# Patient Record
Sex: Female | Born: 1945
Health system: Southern US, Community
[De-identification: ages and names within clinical notes are randomized; demographics above are authoritative.]

## PROBLEM LIST (undated history)

## (undated) DIAGNOSIS — K219 Gastro-esophageal reflux disease without esophagitis: Secondary | ICD-10-CM

## (undated) DIAGNOSIS — D649 Anemia, unspecified: Secondary | ICD-10-CM

## (undated) DIAGNOSIS — I2699 Other pulmonary embolism without acute cor pulmonale: Secondary | ICD-10-CM

## (undated) DIAGNOSIS — F419 Anxiety disorder, unspecified: Secondary | ICD-10-CM

## (undated) DIAGNOSIS — I6529 Occlusion and stenosis of unspecified carotid artery: Secondary | ICD-10-CM

## (undated) DIAGNOSIS — J189 Pneumonia, unspecified organism: Secondary | ICD-10-CM

## (undated) DIAGNOSIS — M25552 Pain in left hip: Secondary | ICD-10-CM

## (undated) DIAGNOSIS — M545 Low back pain: Secondary | ICD-10-CM

## (undated) DIAGNOSIS — I10 Essential (primary) hypertension: Secondary | ICD-10-CM

## (undated) DIAGNOSIS — I509 Heart failure, unspecified: Secondary | ICD-10-CM

## (undated) DIAGNOSIS — R739 Hyperglycemia, unspecified: Secondary | ICD-10-CM

## (undated) DIAGNOSIS — K759 Inflammatory liver disease, unspecified: Secondary | ICD-10-CM

## (undated) DIAGNOSIS — Z0000 Encounter for general adult medical examination without abnormal findings: Secondary | ICD-10-CM

## (undated) DIAGNOSIS — H3530 Unspecified macular degeneration: Secondary | ICD-10-CM

## (undated) DIAGNOSIS — N184 Chronic kidney disease, stage 4 (severe): Secondary | ICD-10-CM

## (undated) DIAGNOSIS — R251 Tremor, unspecified: Secondary | ICD-10-CM

## (undated) DIAGNOSIS — E039 Hypothyroidism, unspecified: Secondary | ICD-10-CM

## (undated) DIAGNOSIS — N390 Urinary tract infection, site not specified: Secondary | ICD-10-CM

## (undated) DIAGNOSIS — M25561 Pain in right knee: Secondary | ICD-10-CM

## (undated) DIAGNOSIS — B019 Varicella without complication: Secondary | ICD-10-CM

## (undated) DIAGNOSIS — M25562 Pain in left knee: Secondary | ICD-10-CM

## (undated) DIAGNOSIS — E785 Hyperlipidemia, unspecified: Secondary | ICD-10-CM

## (undated) DIAGNOSIS — I2510 Atherosclerotic heart disease of native coronary artery without angina pectoris: Secondary | ICD-10-CM

## (undated) DIAGNOSIS — G4730 Sleep apnea, unspecified: Secondary | ICD-10-CM

## (undated) DIAGNOSIS — E079 Disorder of thyroid, unspecified: Secondary | ICD-10-CM

## (undated) DIAGNOSIS — J45909 Unspecified asthma, uncomplicated: Secondary | ICD-10-CM

## (undated) DIAGNOSIS — J449 Chronic obstructive pulmonary disease, unspecified: Secondary | ICD-10-CM

## (undated) DIAGNOSIS — A048 Other specified bacterial intestinal infections: Secondary | ICD-10-CM

## (undated) DIAGNOSIS — I639 Cerebral infarction, unspecified: Secondary | ICD-10-CM

## (undated) DIAGNOSIS — B269 Mumps without complication: Secondary | ICD-10-CM

## (undated) DIAGNOSIS — F329 Major depressive disorder, single episode, unspecified: Secondary | ICD-10-CM

## (undated) DIAGNOSIS — R0789 Other chest pain: Secondary | ICD-10-CM

## (undated) DIAGNOSIS — N189 Chronic kidney disease, unspecified: Secondary | ICD-10-CM

## (undated) DIAGNOSIS — M1990 Unspecified osteoarthritis, unspecified site: Secondary | ICD-10-CM

## (undated) HISTORY — DX: Disorder of thyroid, unspecified: E07.9

## (undated) HISTORY — PX: CATARACT EXTRACTION W/ INTRAOCULAR LENS  IMPLANT, BILATERAL: SHX1307

## (undated) HISTORY — DX: Urinary tract infection, site not specified: N39.0

## (undated) HISTORY — DX: Pain in right knee: M25.561

## (undated) HISTORY — DX: Chronic kidney disease, unspecified: N18.9

## (undated) HISTORY — DX: Varicella without complication: B01.9

## (undated) HISTORY — DX: Encounter for general adult medical examination without abnormal findings: Z00.00

## (undated) HISTORY — DX: Pain in left hip: M25.552

## (undated) HISTORY — DX: Unspecified macular degeneration: H35.30

## (undated) HISTORY — PX: MULTIPLE TOOTH EXTRACTIONS: SHX2053

## (undated) HISTORY — PX: CARDIAC CATHETERIZATION: SHX172

## (undated) HISTORY — DX: Heart failure, unspecified: I50.9

## (undated) HISTORY — DX: Occlusion and stenosis of unspecified carotid artery: I65.29

## (undated) HISTORY — DX: Anxiety disorder, unspecified: F41.9

## (undated) HISTORY — DX: Anemia, unspecified: D64.9

## (undated) HISTORY — DX: Hypothyroidism, unspecified: E03.9

## (undated) HISTORY — DX: Major depressive disorder, single episode, unspecified: F32.9

## (undated) HISTORY — DX: Gastro-esophageal reflux disease without esophagitis: K21.9

## (undated) HISTORY — DX: Unspecified osteoarthritis, unspecified site: M19.90

## (undated) HISTORY — PX: TOTAL KNEE ARTHROPLASTY: SHX125

## (undated) HISTORY — DX: Essential (primary) hypertension: I10

## (undated) HISTORY — DX: Chronic kidney disease, stage 4 (severe): N18.4

## (undated) HISTORY — DX: Other pulmonary embolism without acute cor pulmonale: I26.99

## (undated) HISTORY — PX: JOINT REPLACEMENT: SHX530

## (undated) HISTORY — DX: Hyperglycemia, unspecified: R73.9

## (undated) HISTORY — DX: Unspecified asthma, uncomplicated: J45.909

## (undated) HISTORY — DX: Mumps without complication: B26.9

## (undated) HISTORY — DX: Other specified bacterial intestinal infections: A04.8

## (undated) HISTORY — DX: Low back pain: M54.5

## (undated) HISTORY — DX: Other chest pain: R07.89

## (undated) HISTORY — DX: Tremor, unspecified: R25.1

## (undated) HISTORY — DX: Pain in left knee: M25.562

## (undated) HISTORY — PX: CATARACT EXTRACTION: SUR2

## (undated) HISTORY — DX: Hyperlipidemia, unspecified: E78.5

## (undated) HISTORY — PX: WISDOM TOOTH EXTRACTION: SHX21

## (undated) HISTORY — DX: Chronic obstructive pulmonary disease, unspecified: J44.9

---

## 1982-07-31 HISTORY — PX: APPENDECTOMY: SHX54

## 1982-07-31 HISTORY — PX: VAGINAL HYSTERECTOMY: SUR661

## 1982-07-31 LAB — HM PAP SMEAR

## 2000-12-21 ENCOUNTER — Ambulatory Visit (HOSPITAL_COMMUNITY): Admission: RE | Admit: 2000-12-21 | Discharge: 2000-12-21 | Payer: Self-pay | Admitting: Internal Medicine

## 2000-12-21 ENCOUNTER — Encounter: Payer: Self-pay | Admitting: Internal Medicine

## 2001-02-27 ENCOUNTER — Emergency Department (HOSPITAL_COMMUNITY): Admission: EM | Admit: 2001-02-27 | Discharge: 2001-02-27 | Payer: Self-pay | Admitting: Emergency Medicine

## 2002-04-04 ENCOUNTER — Encounter: Payer: Self-pay | Admitting: Family Medicine

## 2002-04-04 ENCOUNTER — Ambulatory Visit (HOSPITAL_COMMUNITY): Admission: RE | Admit: 2002-04-04 | Discharge: 2002-04-04 | Payer: Self-pay | Admitting: Family Medicine

## 2002-04-22 ENCOUNTER — Ambulatory Visit (HOSPITAL_COMMUNITY): Admission: RE | Admit: 2002-04-22 | Discharge: 2002-04-22 | Payer: Self-pay | Admitting: Pulmonary Disease

## 2002-07-21 ENCOUNTER — Emergency Department (HOSPITAL_COMMUNITY): Admission: EM | Admit: 2002-07-21 | Discharge: 2002-07-22 | Payer: Self-pay | Admitting: *Deleted

## 2002-07-22 ENCOUNTER — Ambulatory Visit (HOSPITAL_COMMUNITY): Admission: RE | Admit: 2002-07-22 | Discharge: 2002-07-22 | Payer: Self-pay | Admitting: Family Medicine

## 2002-07-22 ENCOUNTER — Encounter: Payer: Self-pay | Admitting: Family Medicine

## 2002-10-17 ENCOUNTER — Encounter: Payer: Self-pay | Admitting: Family Medicine

## 2002-10-17 ENCOUNTER — Ambulatory Visit (HOSPITAL_COMMUNITY): Admission: RE | Admit: 2002-10-17 | Discharge: 2002-10-17 | Payer: Self-pay | Admitting: Family Medicine

## 2002-12-15 ENCOUNTER — Encounter: Payer: Self-pay | Admitting: Emergency Medicine

## 2002-12-15 ENCOUNTER — Emergency Department (HOSPITAL_COMMUNITY): Admission: EM | Admit: 2002-12-15 | Discharge: 2002-12-15 | Payer: Self-pay | Admitting: Emergency Medicine

## 2003-01-06 ENCOUNTER — Encounter: Payer: Self-pay | Admitting: Family Medicine

## 2003-01-06 ENCOUNTER — Ambulatory Visit (HOSPITAL_COMMUNITY): Admission: RE | Admit: 2003-01-06 | Discharge: 2003-01-06 | Payer: Self-pay | Admitting: Family Medicine

## 2003-09-25 ENCOUNTER — Emergency Department (HOSPITAL_COMMUNITY): Admission: EM | Admit: 2003-09-25 | Discharge: 2003-09-25 | Payer: Self-pay | Admitting: Emergency Medicine

## 2003-12-24 ENCOUNTER — Inpatient Hospital Stay (HOSPITAL_COMMUNITY): Admission: RE | Admit: 2003-12-24 | Discharge: 2003-12-28 | Payer: Self-pay | Admitting: Orthopedic Surgery

## 2004-01-04 ENCOUNTER — Emergency Department (HOSPITAL_COMMUNITY): Admission: EM | Admit: 2004-01-04 | Discharge: 2004-01-04 | Payer: Self-pay | Admitting: Emergency Medicine

## 2004-04-18 ENCOUNTER — Ambulatory Visit (HOSPITAL_COMMUNITY): Admission: RE | Admit: 2004-04-18 | Discharge: 2004-04-18 | Payer: Self-pay | Admitting: Pulmonary Disease

## 2004-06-01 ENCOUNTER — Ambulatory Visit (HOSPITAL_COMMUNITY): Admission: RE | Admit: 2004-06-01 | Discharge: 2004-06-01 | Payer: Self-pay | Admitting: Internal Medicine

## 2004-07-21 ENCOUNTER — Ambulatory Visit (HOSPITAL_COMMUNITY): Admission: RE | Admit: 2004-07-21 | Discharge: 2004-07-21 | Payer: Self-pay | Admitting: Family Medicine

## 2004-08-15 ENCOUNTER — Ambulatory Visit: Payer: Self-pay | Admitting: Internal Medicine

## 2004-08-15 ENCOUNTER — Ambulatory Visit (HOSPITAL_COMMUNITY): Admission: RE | Admit: 2004-08-15 | Discharge: 2004-08-15 | Payer: Self-pay | Admitting: Internal Medicine

## 2004-08-16 ENCOUNTER — Inpatient Hospital Stay (HOSPITAL_BASED_OUTPATIENT_CLINIC_OR_DEPARTMENT_OTHER): Admission: RE | Admit: 2004-08-16 | Discharge: 2004-08-16 | Payer: Self-pay | Admitting: *Deleted

## 2004-08-16 ENCOUNTER — Ambulatory Visit: Payer: Self-pay | Admitting: *Deleted

## 2004-09-01 ENCOUNTER — Ambulatory Visit: Payer: Self-pay | Admitting: Internal Medicine

## 2005-05-25 ENCOUNTER — Ambulatory Visit (HOSPITAL_COMMUNITY): Admission: RE | Admit: 2005-05-25 | Discharge: 2005-05-25 | Payer: Self-pay | Admitting: Pulmonary Disease

## 2006-02-09 ENCOUNTER — Ambulatory Visit (HOSPITAL_COMMUNITY): Admission: RE | Admit: 2006-02-09 | Discharge: 2006-02-09 | Payer: Self-pay | Admitting: Endocrinology

## 2006-03-16 ENCOUNTER — Ambulatory Visit (HOSPITAL_COMMUNITY): Admission: RE | Admit: 2006-03-16 | Discharge: 2006-03-16 | Payer: Self-pay | Admitting: Endocrinology

## 2006-09-11 ENCOUNTER — Ambulatory Visit (HOSPITAL_COMMUNITY): Payer: Self-pay | Admitting: Family Medicine

## 2006-09-11 ENCOUNTER — Encounter (HOSPITAL_COMMUNITY): Admission: RE | Admit: 2006-09-11 | Discharge: 2006-10-11 | Payer: Self-pay | Admitting: Family Medicine

## 2006-09-12 ENCOUNTER — Encounter: Admission: RE | Admit: 2006-09-12 | Discharge: 2006-09-12 | Payer: Self-pay | Admitting: Nephrology

## 2006-10-18 ENCOUNTER — Encounter (HOSPITAL_COMMUNITY): Admission: RE | Admit: 2006-10-18 | Discharge: 2007-01-08 | Payer: Self-pay | Admitting: Nephrology

## 2006-11-06 ENCOUNTER — Encounter (HOSPITAL_COMMUNITY): Admission: RE | Admit: 2006-11-06 | Discharge: 2006-12-06 | Payer: Self-pay | Admitting: Nephrology

## 2006-11-06 ENCOUNTER — Ambulatory Visit (HOSPITAL_COMMUNITY): Payer: Self-pay | Admitting: Nephrology

## 2006-12-04 ENCOUNTER — Ambulatory Visit (HOSPITAL_COMMUNITY): Payer: Self-pay | Admitting: Nephrology

## 2007-01-01 ENCOUNTER — Encounter (HOSPITAL_COMMUNITY): Admission: RE | Admit: 2007-01-01 | Discharge: 2007-01-31 | Payer: Self-pay | Admitting: Family Medicine

## 2007-01-01 ENCOUNTER — Ambulatory Visit (HOSPITAL_COMMUNITY): Payer: Self-pay | Admitting: Family Medicine

## 2007-01-11 ENCOUNTER — Emergency Department (HOSPITAL_COMMUNITY): Admission: EM | Admit: 2007-01-11 | Discharge: 2007-01-11 | Payer: Self-pay | Admitting: Emergency Medicine

## 2007-02-25 ENCOUNTER — Emergency Department (HOSPITAL_COMMUNITY): Admission: EM | Admit: 2007-02-25 | Discharge: 2007-02-25 | Payer: Self-pay | Admitting: Emergency Medicine

## 2007-02-26 ENCOUNTER — Encounter (HOSPITAL_COMMUNITY): Admission: RE | Admit: 2007-02-26 | Discharge: 2007-03-28 | Payer: Self-pay | Admitting: Nephrology

## 2007-02-26 ENCOUNTER — Ambulatory Visit (HOSPITAL_COMMUNITY): Payer: Self-pay | Admitting: Nephrology

## 2007-03-12 ENCOUNTER — Ambulatory Visit (HOSPITAL_COMMUNITY): Payer: Self-pay | Admitting: Oncology

## 2007-03-20 ENCOUNTER — Ambulatory Visit (HOSPITAL_COMMUNITY): Admission: RE | Admit: 2007-03-20 | Discharge: 2007-03-20 | Payer: Self-pay | Admitting: Nephrology

## 2007-03-21 ENCOUNTER — Encounter (HOSPITAL_COMMUNITY): Admission: RE | Admit: 2007-03-21 | Discharge: 2007-04-20 | Payer: Self-pay | Admitting: *Deleted

## 2007-03-26 ENCOUNTER — Ambulatory Visit (HOSPITAL_COMMUNITY): Payer: Self-pay | Admitting: Family Medicine

## 2007-03-26 ENCOUNTER — Encounter (HOSPITAL_COMMUNITY): Admission: RE | Admit: 2007-03-26 | Discharge: 2007-04-25 | Payer: Self-pay | Admitting: Family Medicine

## 2007-04-09 ENCOUNTER — Encounter (HOSPITAL_COMMUNITY): Admission: RE | Admit: 2007-04-09 | Discharge: 2007-04-30 | Payer: Self-pay | Admitting: Nephrology

## 2007-04-23 ENCOUNTER — Ambulatory Visit (HOSPITAL_COMMUNITY): Payer: Self-pay | Admitting: Family Medicine

## 2007-05-06 ENCOUNTER — Inpatient Hospital Stay (HOSPITAL_COMMUNITY): Admission: AD | Admit: 2007-05-06 | Discharge: 2007-05-11 | Payer: Self-pay | Admitting: *Deleted

## 2007-05-08 ENCOUNTER — Ambulatory Visit: Payer: Self-pay | Admitting: Vascular Surgery

## 2007-05-10 ENCOUNTER — Encounter: Payer: Self-pay | Admitting: Vascular Surgery

## 2007-05-10 HISTORY — PX: CAROTID ENDARTERECTOMY: SUR193

## 2007-05-13 ENCOUNTER — Encounter (HOSPITAL_COMMUNITY): Admission: RE | Admit: 2007-05-13 | Discharge: 2007-06-12 | Payer: Self-pay | Admitting: Family Medicine

## 2007-05-21 ENCOUNTER — Ambulatory Visit: Payer: Self-pay | Admitting: Vascular Surgery

## 2007-06-10 ENCOUNTER — Ambulatory Visit (HOSPITAL_COMMUNITY): Payer: Self-pay | Admitting: Nephrology

## 2007-06-10 ENCOUNTER — Encounter (HOSPITAL_COMMUNITY): Admission: RE | Admit: 2007-06-10 | Discharge: 2007-07-10 | Payer: Self-pay | Admitting: Nephrology

## 2007-07-08 ENCOUNTER — Ambulatory Visit (HOSPITAL_COMMUNITY): Payer: Self-pay | Admitting: Nephrology

## 2007-07-22 ENCOUNTER — Encounter (HOSPITAL_COMMUNITY): Admission: RE | Admit: 2007-07-22 | Discharge: 2007-07-31 | Payer: Self-pay | Admitting: Family Medicine

## 2007-07-22 ENCOUNTER — Ambulatory Visit (HOSPITAL_COMMUNITY): Payer: Self-pay | Admitting: Family Medicine

## 2007-08-05 ENCOUNTER — Encounter (HOSPITAL_COMMUNITY): Admission: RE | Admit: 2007-08-05 | Discharge: 2007-09-04 | Payer: Self-pay | Admitting: Family Medicine

## 2007-09-16 ENCOUNTER — Ambulatory Visit (HOSPITAL_COMMUNITY): Payer: Self-pay | Admitting: Family Medicine

## 2007-09-16 ENCOUNTER — Encounter (HOSPITAL_COMMUNITY): Admission: RE | Admit: 2007-09-16 | Discharge: 2007-10-16 | Payer: Self-pay | Admitting: Oncology

## 2007-10-15 ENCOUNTER — Ambulatory Visit (HOSPITAL_COMMUNITY): Payer: Self-pay | Admitting: Nephrology

## 2007-11-05 ENCOUNTER — Encounter (HOSPITAL_COMMUNITY): Admission: RE | Admit: 2007-11-05 | Discharge: 2007-12-05 | Payer: Self-pay | Admitting: Oncology

## 2007-11-12 ENCOUNTER — Ambulatory Visit (HOSPITAL_COMMUNITY): Payer: Self-pay | Admitting: Family Medicine

## 2007-11-19 ENCOUNTER — Ambulatory Visit: Payer: Self-pay | Admitting: Vascular Surgery

## 2007-11-26 ENCOUNTER — Ambulatory Visit (HOSPITAL_COMMUNITY): Admission: RE | Admit: 2007-11-26 | Discharge: 2007-11-26 | Payer: Self-pay | Admitting: Family Medicine

## 2007-11-28 ENCOUNTER — Ambulatory Visit (HOSPITAL_COMMUNITY): Admission: RE | Admit: 2007-11-28 | Discharge: 2007-11-28 | Payer: Self-pay | Admitting: Family Medicine

## 2007-12-03 ENCOUNTER — Ambulatory Visit (HOSPITAL_COMMUNITY): Payer: Self-pay | Admitting: Nephrology

## 2007-12-31 ENCOUNTER — Ambulatory Visit (HOSPITAL_COMMUNITY): Payer: Self-pay | Admitting: Nephrology

## 2007-12-31 ENCOUNTER — Encounter (HOSPITAL_COMMUNITY): Admission: RE | Admit: 2007-12-31 | Discharge: 2008-01-30 | Payer: Self-pay | Admitting: Nephrology

## 2008-01-28 ENCOUNTER — Encounter (HOSPITAL_COMMUNITY): Admission: RE | Admit: 2008-01-28 | Discharge: 2008-02-27 | Payer: Self-pay | Admitting: Nephrology

## 2008-02-18 ENCOUNTER — Ambulatory Visit: Payer: Self-pay | Admitting: Vascular Surgery

## 2008-02-25 ENCOUNTER — Ambulatory Visit (HOSPITAL_COMMUNITY): Payer: Self-pay | Admitting: Nephrology

## 2008-03-24 ENCOUNTER — Encounter (HOSPITAL_COMMUNITY): Admission: RE | Admit: 2008-03-24 | Discharge: 2008-04-23 | Payer: Self-pay | Admitting: Nephrology

## 2008-05-19 ENCOUNTER — Encounter (HOSPITAL_COMMUNITY): Admission: RE | Admit: 2008-05-19 | Discharge: 2008-06-18 | Payer: Self-pay | Admitting: Nephrology

## 2008-05-19 ENCOUNTER — Ambulatory Visit (HOSPITAL_COMMUNITY): Payer: Self-pay | Admitting: Oncology

## 2008-06-05 ENCOUNTER — Ambulatory Visit (HOSPITAL_COMMUNITY): Admission: RE | Admit: 2008-06-05 | Discharge: 2008-06-05 | Payer: Self-pay | Admitting: Pulmonary Disease

## 2008-06-15 ENCOUNTER — Ambulatory Visit (HOSPITAL_COMMUNITY): Payer: Self-pay | Admitting: Nephrology

## 2008-06-16 ENCOUNTER — Inpatient Hospital Stay (HOSPITAL_COMMUNITY): Admission: RE | Admit: 2008-06-16 | Discharge: 2008-06-19 | Payer: Self-pay | Admitting: Orthopedic Surgery

## 2008-06-16 ENCOUNTER — Encounter (INDEPENDENT_AMBULATORY_CARE_PROVIDER_SITE_OTHER): Payer: Self-pay | Admitting: Orthopedic Surgery

## 2008-07-13 ENCOUNTER — Encounter (HOSPITAL_COMMUNITY): Admission: RE | Admit: 2008-07-13 | Discharge: 2008-08-12 | Payer: Self-pay | Admitting: Oncology

## 2008-07-31 LAB — HM COLONOSCOPY: HM Colonoscopy: NORMAL

## 2008-08-06 ENCOUNTER — Ambulatory Visit (HOSPITAL_COMMUNITY): Payer: Self-pay | Admitting: Nephrology

## 2008-08-11 ENCOUNTER — Ambulatory Visit: Payer: Self-pay | Admitting: Vascular Surgery

## 2008-08-13 ENCOUNTER — Encounter (HOSPITAL_COMMUNITY): Admission: RE | Admit: 2008-08-13 | Discharge: 2008-09-12 | Payer: Self-pay | Admitting: Nephrology

## 2008-08-23 ENCOUNTER — Ambulatory Visit: Payer: Self-pay | Admitting: Infectious Diseases

## 2008-08-23 ENCOUNTER — Observation Stay (HOSPITAL_COMMUNITY): Admission: EM | Admit: 2008-08-23 | Discharge: 2008-08-24 | Payer: Self-pay | Admitting: Emergency Medicine

## 2008-08-24 ENCOUNTER — Ambulatory Visit: Payer: Self-pay | Admitting: Surgery

## 2008-08-24 ENCOUNTER — Encounter: Payer: Self-pay | Admitting: Infectious Diseases

## 2008-09-18 ENCOUNTER — Ambulatory Visit: Payer: Self-pay | Admitting: Gastroenterology

## 2008-09-21 ENCOUNTER — Telehealth (INDEPENDENT_AMBULATORY_CARE_PROVIDER_SITE_OTHER): Payer: Self-pay

## 2008-09-24 ENCOUNTER — Ambulatory Visit: Payer: Self-pay | Admitting: Gastroenterology

## 2008-09-24 ENCOUNTER — Ambulatory Visit (HOSPITAL_COMMUNITY): Admission: RE | Admit: 2008-09-24 | Discharge: 2008-09-24 | Payer: Self-pay | Admitting: Gastroenterology

## 2008-09-24 ENCOUNTER — Encounter: Payer: Self-pay | Admitting: Gastroenterology

## 2008-09-24 LAB — HM COLONOSCOPY

## 2008-09-25 HISTORY — PX: COLONOSCOPY: SHX174

## 2008-09-25 LAB — HM COLONOSCOPY

## 2008-10-08 ENCOUNTER — Ambulatory Visit (HOSPITAL_COMMUNITY): Payer: Self-pay | Admitting: Nephrology

## 2008-10-08 ENCOUNTER — Encounter (HOSPITAL_COMMUNITY): Admission: RE | Admit: 2008-10-08 | Discharge: 2008-11-07 | Payer: Self-pay | Admitting: Nephrology

## 2008-10-20 ENCOUNTER — Encounter: Payer: Self-pay | Admitting: Gastroenterology

## 2008-11-06 ENCOUNTER — Encounter (HOSPITAL_COMMUNITY): Admission: RE | Admit: 2008-11-06 | Discharge: 2008-12-06 | Payer: Self-pay | Admitting: Family Medicine

## 2008-11-06 ENCOUNTER — Ambulatory Visit (HOSPITAL_COMMUNITY): Payer: Self-pay | Admitting: Family Medicine

## 2008-12-04 ENCOUNTER — Encounter (HOSPITAL_COMMUNITY): Admission: RE | Admit: 2008-12-04 | Discharge: 2009-01-03 | Payer: Self-pay | Admitting: Nephrology

## 2008-12-04 ENCOUNTER — Ambulatory Visit (HOSPITAL_COMMUNITY): Payer: Self-pay | Admitting: Nephrology

## 2008-12-14 DIAGNOSIS — J45909 Unspecified asthma, uncomplicated: Secondary | ICD-10-CM | POA: Insufficient documentation

## 2008-12-14 DIAGNOSIS — F418 Other specified anxiety disorders: Secondary | ICD-10-CM

## 2008-12-14 DIAGNOSIS — F419 Anxiety disorder, unspecified: Secondary | ICD-10-CM

## 2008-12-14 DIAGNOSIS — K219 Gastro-esophageal reflux disease without esophagitis: Secondary | ICD-10-CM

## 2008-12-14 DIAGNOSIS — J449 Chronic obstructive pulmonary disease, unspecified: Secondary | ICD-10-CM

## 2008-12-14 DIAGNOSIS — E079 Disorder of thyroid, unspecified: Secondary | ICD-10-CM | POA: Insufficient documentation

## 2008-12-14 DIAGNOSIS — F32A Depression, unspecified: Secondary | ICD-10-CM | POA: Insufficient documentation

## 2008-12-14 DIAGNOSIS — G4733 Obstructive sleep apnea (adult) (pediatric): Secondary | ICD-10-CM

## 2008-12-14 DIAGNOSIS — E039 Hypothyroidism, unspecified: Secondary | ICD-10-CM | POA: Insufficient documentation

## 2008-12-14 DIAGNOSIS — F329 Major depressive disorder, single episode, unspecified: Secondary | ICD-10-CM | POA: Insufficient documentation

## 2008-12-14 HISTORY — DX: Gastro-esophageal reflux disease without esophagitis: K21.9

## 2008-12-14 HISTORY — DX: Depression, unspecified: F32.A

## 2008-12-14 HISTORY — DX: Obstructive sleep apnea (adult) (pediatric): G47.33

## 2008-12-14 HISTORY — DX: Chronic obstructive pulmonary disease, unspecified: J44.9

## 2008-12-14 HISTORY — DX: Anxiety disorder, unspecified: F41.9

## 2008-12-14 HISTORY — DX: Other specified anxiety disorders: F41.8

## 2008-12-17 ENCOUNTER — Ambulatory Visit: Payer: Self-pay | Admitting: Internal Medicine

## 2008-12-17 DIAGNOSIS — D649 Anemia, unspecified: Secondary | ICD-10-CM | POA: Insufficient documentation

## 2008-12-17 DIAGNOSIS — K5900 Constipation, unspecified: Secondary | ICD-10-CM

## 2008-12-17 HISTORY — DX: Constipation, unspecified: K59.00

## 2009-01-29 ENCOUNTER — Encounter (HOSPITAL_COMMUNITY): Admission: RE | Admit: 2009-01-29 | Discharge: 2009-02-28 | Payer: Self-pay | Admitting: Family Medicine

## 2009-01-29 ENCOUNTER — Ambulatory Visit (HOSPITAL_COMMUNITY): Payer: Self-pay | Admitting: Family Medicine

## 2009-02-26 ENCOUNTER — Ambulatory Visit (HOSPITAL_COMMUNITY): Payer: Self-pay | Admitting: Nephrology

## 2009-03-11 ENCOUNTER — Encounter (HOSPITAL_COMMUNITY): Admission: RE | Admit: 2009-03-11 | Discharge: 2009-04-10 | Payer: Self-pay | Admitting: Oncology

## 2009-03-17 ENCOUNTER — Ambulatory Visit (HOSPITAL_COMMUNITY): Admission: RE | Admit: 2009-03-17 | Discharge: 2009-03-17 | Payer: Self-pay | Admitting: Family Medicine

## 2009-03-29 ENCOUNTER — Inpatient Hospital Stay (HOSPITAL_COMMUNITY): Admission: RE | Admit: 2009-03-29 | Discharge: 2009-04-02 | Payer: Self-pay | Admitting: Orthopedic Surgery

## 2009-04-30 ENCOUNTER — Encounter (HOSPITAL_COMMUNITY): Admission: RE | Admit: 2009-04-30 | Discharge: 2009-05-30 | Payer: Self-pay | Admitting: Nephrology

## 2009-04-30 ENCOUNTER — Ambulatory Visit (HOSPITAL_COMMUNITY): Payer: Self-pay | Admitting: Nephrology

## 2009-06-23 ENCOUNTER — Encounter (HOSPITAL_COMMUNITY): Admission: RE | Admit: 2009-06-23 | Discharge: 2009-07-23 | Payer: Self-pay | Admitting: Oncology

## 2009-06-23 ENCOUNTER — Ambulatory Visit (HOSPITAL_COMMUNITY): Payer: Self-pay | Admitting: Oncology

## 2009-07-21 ENCOUNTER — Ambulatory Visit (HOSPITAL_COMMUNITY): Payer: Self-pay | Admitting: Nephrology

## 2009-08-17 ENCOUNTER — Ambulatory Visit: Payer: Self-pay | Admitting: Vascular Surgery

## 2009-08-18 ENCOUNTER — Encounter (HOSPITAL_COMMUNITY): Admission: RE | Admit: 2009-08-18 | Discharge: 2009-09-17 | Payer: Self-pay | Admitting: Nephrology

## 2009-09-30 ENCOUNTER — Ambulatory Visit (HOSPITAL_COMMUNITY): Payer: Self-pay | Admitting: Nephrology

## 2009-09-30 ENCOUNTER — Encounter (HOSPITAL_COMMUNITY): Admission: RE | Admit: 2009-09-30 | Discharge: 2009-10-30 | Payer: Self-pay | Admitting: Nephrology

## 2009-10-11 ENCOUNTER — Ambulatory Visit (HOSPITAL_BASED_OUTPATIENT_CLINIC_OR_DEPARTMENT_OTHER): Admission: RE | Admit: 2009-10-11 | Discharge: 2009-10-12 | Payer: Self-pay | Admitting: Orthopedic Surgery

## 2009-11-25 ENCOUNTER — Ambulatory Visit (HOSPITAL_COMMUNITY): Payer: Self-pay | Admitting: Nephrology

## 2009-11-25 ENCOUNTER — Encounter (HOSPITAL_COMMUNITY): Admission: RE | Admit: 2009-11-25 | Discharge: 2009-12-25 | Payer: Self-pay | Admitting: Nephrology

## 2010-02-04 ENCOUNTER — Emergency Department (HOSPITAL_COMMUNITY): Admission: EM | Admit: 2010-02-04 | Discharge: 2010-02-04 | Payer: Self-pay | Admitting: Emergency Medicine

## 2010-08-21 ENCOUNTER — Encounter: Payer: Self-pay | Admitting: Endocrinology

## 2010-08-30 ENCOUNTER — Ambulatory Visit
Admission: RE | Admit: 2010-08-30 | Discharge: 2010-08-30 | Payer: Self-pay | Source: Home / Self Care | Attending: Vascular Surgery | Admitting: Vascular Surgery

## 2010-09-05 NOTE — Procedures (Unsigned)
CAROTID DUPLEX EXAM  INDICATION:  Carotid artery disease  HISTORY: Diabetes:  no Cardiac:  no Hypertension:  yes Smoking:  no Previous Surgery:  Right carotid endarterectomy in 2008 CV History:  The patient is currently asymptomatic Amaurosis Fugax No, Paresthesia No, Hemiparesis  No                                      RIGHT             LEFT Brachial systolic pressure:         126               119 Brachial Doppler waveforms:         WNL Vertebral direction of flow:                          Antegrade DUPLEX VELOCITIES (cm/sec) CCA peak systolic                                     [id number] ECA peak systolic                                     [id number] ICA peak systolic                                     [id number] ICA end diastolic                                     42 PLAQUE MORPHOLOGY:                                    Heterogenous PLAQUE AMOUNT:                                        Moderate PLAQUE LOCATION:                                      ICA  IMPRESSION: 1. Left internal carotid artery is 40% to 59% stenosis. 2. Known right internal carotid artery occlusion. 3. Study stable compared to previous.  ___________________________________________ [name] [name], M.D.  OD/MEDQ  D:  08/30/2010  T:  08/30/2010  Job:  [id number]

## 2010-10-16 LAB — POCT CARDIAC MARKERS
CKMB, poc: <1 ng/mL — ABNORMAL LOW (ref 1.0–8.0)
Myoglobin, poc: 156 ng/mL (ref 12–200)
Troponin i, poc: <0.05 ng/mL (ref 0.00–0.09)

## 2010-10-16 LAB — RENAL FUNCTION PANEL
Albumin: 4 g/dL (ref 3.5–5.2)
CO2: 31 mEq/L (ref 19–32)
Chloride: 102 mEq/L (ref 96–112)
Creatinine, Ser: 1.84 mg/dL — ABNORMAL HIGH (ref 0.4–1.2)
GFR calc Af Amer: 34 mL/min — ABNORMAL LOW (ref >60)
GFR calc non Af Amer: 28 mL/min — ABNORMAL LOW (ref >60)
Potassium: 4.5 mEq/L (ref 3.5–5.1)

## 2010-10-16 LAB — CBC
Platelets: 225 10*3/uL (ref 150–400)
RBC: 4.35 MIL/uL (ref 3.87–5.11)
RDW: 13.5 % (ref 11.5–15.5)
WBC: 8.4 10*3/uL (ref 4.0–10.5)

## 2010-10-16 LAB — DIFFERENTIAL
Basophils Absolute: 0 10*3/uL (ref 0.0–0.1)
Lymphocytes Relative: 17 % (ref 12–46)
Lymphs Abs: 1.4 10*3/uL (ref 0.7–4.0)
Neutro Abs: 6.4 10*3/uL (ref 1.7–7.7)
Neutrophils Relative %: 76 % (ref 43–77)

## 2010-10-16 LAB — IRON AND TIBC
Iron: 74 ug/dL (ref 42–135)
TIBC: 322 ug/dL (ref 250–470)

## 2010-10-16 LAB — BASIC METABOLIC PANEL
BUN: 23 mg/dL (ref 6–23)
Chloride: 108 mEq/L (ref 96–112)
Creatinine, Ser: 1.36 mg/dL — ABNORMAL HIGH (ref 0.4–1.2)
GFR calc Af Amer: 48 mL/min — ABNORMAL LOW (ref >60)
GFR calc non Af Amer: 40 mL/min — ABNORMAL LOW (ref >60)

## 2010-10-16 LAB — HEMOGLOBIN AND HEMATOCRIT, BLOOD: Hemoglobin: 12.8 g/dL (ref 12.0–15.0)

## 2010-10-16 LAB — FERRITIN: Ferritin: 459 ng/mL — ABNORMAL HIGH (ref 10–291)

## 2010-10-18 LAB — PTH, INTACT AND CALCIUM
Calcium, Total (PTH): 9.5 mg/dL (ref 8.4–10.5)
PTH: 15.6 pg/mL (ref 14.0–72.0)

## 2010-10-18 LAB — HEMOGLOBIN AND HEMATOCRIT, BLOOD: HCT: 37.3 % (ref 36.0–46.0)

## 2010-10-18 LAB — IRON AND TIBC
Iron: 103 ug/dL (ref 42–135)
TIBC: 319 ug/dL (ref 250–470)

## 2010-10-18 LAB — RENAL FUNCTION PANEL
Albumin: 4 g/dL (ref 3.5–5.2)
BUN: 22 mg/dL (ref 6–23)
Chloride: 103 mEq/L (ref 96–112)
GFR calc Af Amer: 35 mL/min — ABNORMAL LOW (ref >60)
GFR calc non Af Amer: 29 mL/min — ABNORMAL LOW (ref >60)
Phosphorus: 3.3 mg/dL (ref 2.3–4.6)
Potassium: 4.1 mEq/L (ref 3.5–5.1)
Sodium: 136 mEq/L (ref 135–145)

## 2010-10-18 LAB — URINE MICROSCOPIC-ADD ON

## 2010-10-18 LAB — URINALYSIS, ROUTINE W REFLEX MICROSCOPIC
Hgb urine dipstick: NEGATIVE
Nitrite: NEGATIVE
Protein, ur: NEGATIVE mg/dL
Specific Gravity, Urine: 1.02 (ref 1.005–1.030)
Urobilinogen, UA: 1 mg/dL (ref 0.0–1.0)

## 2010-10-19 LAB — HEMOGLOBIN AND HEMATOCRIT, BLOOD: Hemoglobin: 11.6 g/dL — ABNORMAL LOW (ref 12.0–15.0)

## 2010-10-21 LAB — RENAL FUNCTION PANEL
BUN: 30 mg/dL — ABNORMAL HIGH (ref 6–23)
Chloride: 103 mEq/L (ref 96–112)
Creatinine, Ser: 1.6 mg/dL — ABNORMAL HIGH (ref 0.4–1.2)
Glucose, Bld: 78 mg/dL (ref 70–99)
Phosphorus: 3 mg/dL (ref 2.3–4.6)
Potassium: 3.9 mEq/L (ref 3.5–5.1)

## 2010-10-21 LAB — IRON AND TIBC
Saturation Ratios: 22 % (ref 20–55)
TIBC: 303 ug/dL (ref 250–470)
UIBC: 235 ug/dL

## 2010-10-21 LAB — HEMOGLOBIN AND HEMATOCRIT, BLOOD
HCT: 35.7 % — ABNORMAL LOW (ref 36.0–46.0)
Hemoglobin: 12.3 g/dL (ref 12.0–15.0)

## 2010-10-23 LAB — POCT I-STAT, CHEM 8
BUN: 24 mg/dL — ABNORMAL HIGH (ref 6–23)
Chloride: 109 mEq/L (ref 96–112)
Sodium: 139 mEq/L (ref 135–145)

## 2010-10-31 LAB — HEMOGLOBIN AND HEMATOCRIT, BLOOD
HCT: 35.5 % — ABNORMAL LOW (ref 36.0–46.0)
Hemoglobin: 11.9 g/dL — ABNORMAL LOW (ref 12.0–15.0)

## 2010-11-02 LAB — RENAL FUNCTION PANEL
BUN: 26 mg/dL — ABNORMAL HIGH (ref 6–23)
CO2: 31 mEq/L (ref 19–32)
Chloride: 108 mEq/L (ref 96–112)
Glucose, Bld: 100 mg/dL — ABNORMAL HIGH (ref 70–99)
Potassium: 4.9 mEq/L (ref 3.5–5.1)

## 2010-11-02 LAB — IRON AND TIBC
Saturation Ratios: 15 % — ABNORMAL LOW (ref 20–55)
TIBC: 332 ug/dL (ref 250–470)

## 2010-11-03 LAB — RENAL FUNCTION PANEL
Albumin: 4.3 g/dL (ref 3.5–5.2)
BUN: 29 mg/dL — ABNORMAL HIGH (ref 6–23)
CO2: 26 mEq/L (ref 19–32)
Chloride: 105 mEq/L (ref 96–112)
Creatinine, Ser: 1.96 mg/dL — ABNORMAL HIGH (ref 0.4–1.2)
GFR calc non Af Amer: 26 mL/min — ABNORMAL LOW (ref >60)
Potassium: 4.2 mEq/L (ref 3.5–5.1)

## 2010-11-03 LAB — HEMOGLOBIN AND HEMATOCRIT, BLOOD: HCT: 34.9 % — ABNORMAL LOW (ref 36.0–46.0)

## 2010-11-03 LAB — IRON AND TIBC
Iron: 57 ug/dL (ref 42–135)
Saturation Ratios: 16 % — ABNORMAL LOW (ref 20–55)
UIBC: 290 ug/dL

## 2010-11-04 LAB — URINALYSIS, ROUTINE W REFLEX MICROSCOPIC
Bilirubin Urine: NEGATIVE
Glucose, UA: NEGATIVE mg/dL
Ketones, ur: NEGATIVE mg/dL
Nitrite: NEGATIVE
Specific Gravity, Urine: 1.014 (ref 1.005–1.030)
pH: 7 (ref 5.0–8.0)

## 2010-11-04 LAB — BASIC METABOLIC PANEL
BUN: 9 mg/dL (ref 6–23)
CO2: 26 mEq/L (ref 19–32)
Calcium: 8.2 mg/dL — ABNORMAL LOW (ref 8.4–10.5)
Chloride: 102 mEq/L (ref 96–112)
Chloride: 104 mEq/L (ref 96–112)
GFR calc Af Amer: 37 mL/min — ABNORMAL LOW (ref >60)
GFR calc non Af Amer: 35 mL/min — ABNORMAL LOW (ref >60)
Glucose, Bld: 113 mg/dL — ABNORMAL HIGH (ref 70–99)
Glucose, Bld: 119 mg/dL — ABNORMAL HIGH (ref 70–99)
Potassium: 3.8 mEq/L (ref 3.5–5.1)
Sodium: 135 mEq/L (ref 135–145)
Sodium: 138 mEq/L (ref 135–145)

## 2010-11-04 LAB — CBC
HCT: 26 % — ABNORMAL LOW (ref 36.0–46.0)
HCT: 26.6 % — ABNORMAL LOW (ref 36.0–46.0)
HCT: 28.3 % — ABNORMAL LOW (ref 36.0–46.0)
Hemoglobin: 8.9 g/dL — ABNORMAL LOW (ref 12.0–15.0)
Hemoglobin: 9.8 g/dL — ABNORMAL LOW (ref 12.0–15.0)
MCHC: 34.1 g/dL (ref 30.0–36.0)
MCHC: 34.1 g/dL (ref 30.0–36.0)
MCV: 88.9 fL (ref 78.0–100.0)
MCV: 89.9 fL (ref 78.0–100.0)
MCV: 90.6 fL (ref 78.0–100.0)
Platelets: 171 10*3/uL (ref 150–400)
RBC: 2.87 MIL/uL — ABNORMAL LOW (ref 3.87–5.11)
RBC: 2.96 MIL/uL — ABNORMAL LOW (ref 3.87–5.11)
RDW: 13.9 % (ref 11.5–15.5)
WBC: 11.1 10*3/uL — ABNORMAL HIGH (ref 4.0–10.5)
WBC: 11.1 10*3/uL — ABNORMAL HIGH (ref 4.0–10.5)

## 2010-11-04 LAB — URINE MICROSCOPIC-ADD ON

## 2010-11-04 LAB — CROSSMATCH

## 2010-11-04 LAB — URINALYSIS, MICROSCOPIC ONLY
Bilirubin Urine: NEGATIVE
Nitrite: NEGATIVE
Protein, ur: 100 mg/dL — AB
Urobilinogen, UA: 1 mg/dL (ref 0.0–1.0)

## 2010-11-04 LAB — PROTIME-INR: INR: 1.4 (ref 0.00–1.49)

## 2010-11-04 LAB — TRANSFUSION REACTION

## 2010-11-05 LAB — BASIC METABOLIC PANEL
BUN: 17 mg/dL (ref 6–23)
Calcium: 8.3 mg/dL — ABNORMAL LOW (ref 8.4–10.5)
Chloride: 104 mEq/L (ref 96–112)
GFR calc Af Amer: 30 mL/min — ABNORMAL LOW (ref >60)
GFR calc non Af Amer: 36 mL/min — ABNORMAL LOW (ref >60)
Glucose, Bld: 133 mg/dL — ABNORMAL HIGH (ref 70–99)
Potassium: 4.1 mEq/L (ref 3.5–5.1)
Sodium: 132 mEq/L — ABNORMAL LOW (ref 135–145)

## 2010-11-05 LAB — PROTIME-INR: INR: 0.9 (ref 0.00–1.49)

## 2010-11-05 LAB — TYPE AND SCREEN
ABO/RH(D): AB NEG
Antibody Screen: NEGATIVE

## 2010-11-05 LAB — DIFFERENTIAL
Eosinophils Absolute: 0.4 10*3/uL (ref 0.0–0.7)
Eosinophils Relative: 4 % (ref 0–5)
Lymphs Abs: 1.8 10*3/uL (ref 0.7–4.0)
Monocytes Absolute: 0.6 10*3/uL (ref 0.1–1.0)
Monocytes Relative: 7 % (ref 3–12)

## 2010-11-05 LAB — CBC
HCT: 32.8 % — ABNORMAL LOW (ref 36.0–46.0)
HCT: 37.1 % (ref 36.0–46.0)
Hemoglobin: 11 g/dL — ABNORMAL LOW (ref 12.0–15.0)
Hemoglobin: 12.7 g/dL (ref 12.0–15.0)
MCHC: 33.6 g/dL (ref 30.0–36.0)
MCV: 90.2 fL (ref 78.0–100.0)
RBC: 3.62 MIL/uL — ABNORMAL LOW (ref 3.87–5.11)
RBC: 4.11 MIL/uL (ref 3.87–5.11)
RDW: 14.3 % (ref 11.5–15.5)
WBC: 9.3 10*3/uL (ref 4.0–10.5)

## 2010-11-05 LAB — HEMOGLOBIN AND HEMATOCRIT, BLOOD
HCT: 33.8 % — ABNORMAL LOW (ref 36.0–46.0)
Hemoglobin: 12.2 g/dL (ref 12.0–15.0)

## 2010-11-05 LAB — URINALYSIS, ROUTINE W REFLEX MICROSCOPIC
Hgb urine dipstick: NEGATIVE
Specific Gravity, Urine: 1.015 (ref 1.005–1.030)
Urobilinogen, UA: 1 mg/dL (ref 0.0–1.0)

## 2010-11-06 LAB — HEMOGLOBIN AND HEMATOCRIT, BLOOD
HCT: 34.8 % — ABNORMAL LOW (ref 36.0–46.0)
HCT: 33.1 % — ABNORMAL LOW (ref 36.0–46.0)

## 2010-11-06 LAB — IRON AND TIBC
Iron: 49 ug/dL (ref 42–135)
Saturation Ratios: 16 % — ABNORMAL LOW (ref 20–55)
TIBC: 307 ug/dL (ref 250–470)

## 2010-11-06 LAB — RENAL FUNCTION PANEL
Albumin: 3.8 g/dL (ref 3.5–5.2)
BUN: 32 mg/dL — ABNORMAL HIGH (ref 6–23)
CO2: 27 mEq/L (ref 19–32)
Chloride: 104 mEq/L (ref 96–112)
Creatinine, Ser: 2.25 mg/dL — ABNORMAL HIGH (ref 0.4–1.2)
Glucose, Bld: 90 mg/dL (ref 70–99)

## 2010-11-07 LAB — IRON AND TIBC
Iron: 100 ug/dL (ref 42–135)
Saturation Ratios: 30 % (ref 20–55)
TIBC: 332 ug/dL (ref 250–470)
UIBC: 232 ug/dL

## 2010-11-07 LAB — HEMOGLOBIN AND HEMATOCRIT, BLOOD: Hemoglobin: 11.7 g/dL — ABNORMAL LOW (ref 12.0–15.0)

## 2010-11-07 LAB — RENAL FUNCTION PANEL
BUN: 25 mg/dL — ABNORMAL HIGH (ref 6–23)
CO2: 29 mEq/L (ref 19–32)
Calcium: 9.2 mg/dL (ref 8.4–10.5)
Creatinine, Ser: 2.17 mg/dL — ABNORMAL HIGH (ref 0.4–1.2)
Glucose, Bld: 97 mg/dL (ref 70–99)
Phosphorus: 3.2 mg/dL (ref 2.3–4.6)

## 2010-11-09 LAB — IRON AND TIBC
Saturation Ratios: 20 % (ref 20–55)
TIBC: 324 ug/dL (ref 250–470)
UIBC: 260 ug/dL

## 2010-11-09 LAB — RENAL FUNCTION PANEL
Albumin: 3.8 g/dL (ref 3.5–5.2)
BUN: 22 mg/dL (ref 6–23)
Calcium: 9.4 mg/dL (ref 8.4–10.5)
Creatinine, Ser: 1.94 mg/dL — ABNORMAL HIGH (ref 0.4–1.2)
GFR calc Af Amer: 32 mL/min — ABNORMAL LOW (ref >60)
GFR calc non Af Amer: 26 mL/min — ABNORMAL LOW (ref >60)

## 2010-11-09 LAB — HEMOGLOBIN AND HEMATOCRIT, BLOOD: HCT: 32.8 % — ABNORMAL LOW (ref 36.0–46.0)

## 2010-11-14 LAB — CBC
HCT: 34.1 % — ABNORMAL LOW (ref 36.0–46.0)
Hemoglobin: 11.2 g/dL — ABNORMAL LOW (ref 12.0–15.0)
MCHC: 32.9 g/dL (ref 30.0–36.0)
MCHC: 33 g/dL (ref 30.0–36.0)
MCV: 88.8 fL (ref 78.0–100.0)
MCV: 89.6 fL (ref 78.0–100.0)
Platelets: 205 10*3/uL (ref 150–400)
Platelets: 212 10*3/uL (ref 150–400)
RBC: 3.84 MIL/uL — ABNORMAL LOW (ref 3.87–5.11)
RDW: 14.8 % (ref 11.5–15.5)
RDW: 14.9 % (ref 11.5–15.5)
WBC: 8.7 10*3/uL (ref 4.0–10.5)

## 2010-11-14 LAB — RENAL FUNCTION PANEL
CO2: 29 mEq/L (ref 19–32)
Calcium: 9.2 mg/dL (ref 8.4–10.5)
Chloride: 107 mEq/L (ref 96–112)
GFR calc Af Amer: 32 mL/min — ABNORMAL LOW (ref >60)
GFR calc non Af Amer: 26 mL/min — ABNORMAL LOW (ref >60)
Potassium: 4.3 mEq/L (ref 3.5–5.1)
Sodium: 142 mEq/L (ref 135–145)

## 2010-11-14 LAB — HEPATIC FUNCTION PANEL
Albumin: 3.5 g/dL (ref 3.5–5.2)
Total Bilirubin: 0.7 mg/dL (ref 0.3–1.2)
Total Protein: 6.5 g/dL (ref 6.0–8.3)

## 2010-11-14 LAB — POCT CARDIAC MARKERS
Myoglobin, poc: 80.6 ng/mL (ref 12–200)
Troponin i, poc: <0.05 ng/mL (ref 0.00–0.09)

## 2010-11-14 LAB — DIFFERENTIAL
Basophils Absolute: 0 10*3/uL (ref 0.0–0.1)
Basophils Absolute: 0 10*3/uL (ref 0.0–0.1)
Basophils Relative: 0 % (ref 0–1)
Basophils Relative: 1 % (ref 0–1)
Eosinophils Absolute: 0.2 10*3/uL (ref 0.0–0.7)
Eosinophils Absolute: 0.3 10*3/uL (ref 0.0–0.7)
Eosinophils Relative: 3 % (ref 0–5)
Lymphocytes Relative: 16 % (ref 12–46)
Lymphs Abs: 1.4 10*3/uL (ref 0.7–4.0)
Monocytes Absolute: 0.6 10*3/uL (ref 0.1–1.0)
Monocytes Relative: 6 % (ref 3–12)
Neutro Abs: 6.5 10*3/uL (ref 1.7–7.7)
Neutrophils Relative %: 68 % (ref 43–77)
Neutrophils Relative %: 75 % (ref 43–77)

## 2010-11-14 LAB — CARDIAC PANEL(CRET KIN+CKTOT+MB+TROPI)
Relative Index: INVALID (ref 0.0–2.5)
Total CK: 50 U/L (ref 7–177)
Troponin I: <0.01 ng/mL (ref 0.00–0.06)

## 2010-11-14 LAB — COMPREHENSIVE METABOLIC PANEL
ALT: 26 U/L (ref 0–35)
Alkaline Phosphatase: 46 U/L (ref 39–117)
BUN: 25 mg/dL — ABNORMAL HIGH (ref 6–23)
CO2: 29 mEq/L (ref 19–32)
GFR calc non Af Amer: 29 mL/min — ABNORMAL LOW (ref >60)
Glucose, Bld: 90 mg/dL (ref 70–99)
Potassium: 4.2 mEq/L (ref 3.5–5.1)
Sodium: 141 mEq/L (ref 135–145)
Total Protein: 6.7 g/dL (ref 6.0–8.3)

## 2010-11-14 LAB — CK TOTAL AND CKMB (NOT AT ARMC)
CK, MB: 0.5 ng/mL (ref 0.3–4.0)
Relative Index: INVALID (ref 0.0–2.5)
Total CK: 98 U/L (ref 7–177)

## 2010-11-14 LAB — HEMOCCULT GUIAC POC 1CARD (OFFICE): Fecal Occult Bld: NEGATIVE

## 2010-11-14 LAB — HEMOGLOBIN AND HEMATOCRIT, BLOOD
HCT: 35.2 % — ABNORMAL LOW (ref 36.0–46.0)
Hemoglobin: 11.5 g/dL — ABNORMAL LOW (ref 12.0–15.0)

## 2010-11-14 LAB — TSH: TSH: 1.075 u[IU]/mL (ref 0.350–4.500)

## 2010-11-14 LAB — GLUCOSE, CAPILLARY: Glucose-Capillary: 81 mg/dL (ref 70–99)

## 2010-11-14 LAB — LIPASE, BLOOD: Lipase: 34 U/L (ref 11–59)

## 2010-11-14 LAB — TROPONIN I: Troponin I: 0.01 ng/mL (ref 0.00–0.06)

## 2010-11-14 LAB — D-DIMER, QUANTITATIVE: D-Dimer, Quant: 0.8 ug/mL-FEU — ABNORMAL HIGH (ref 0.00–0.48)

## 2010-11-15 LAB — CBC
Hemoglobin: 11.4 g/dL — ABNORMAL LOW (ref 12.0–15.0)
RBC: 3.79 MIL/uL — ABNORMAL LOW (ref 3.87–5.11)
WBC: 7 10*3/uL (ref 4.0–10.5)

## 2010-11-15 LAB — BASIC METABOLIC PANEL
Calcium: 9.5 mg/dL (ref 8.4–10.5)
GFR calc Af Amer: 37 mL/min — ABNORMAL LOW (ref >60)
GFR calc non Af Amer: 30 mL/min — ABNORMAL LOW (ref >60)
Sodium: 141 mEq/L (ref 135–145)

## 2010-11-15 LAB — HEMOGLOBIN AND HEMATOCRIT, BLOOD: Hemoglobin: 11.7 g/dL — ABNORMAL LOW (ref 12.0–15.0)

## 2010-11-15 LAB — IRON AND TIBC: UIBC: 270 ug/dL

## 2010-11-15 LAB — RENAL FUNCTION PANEL
CO2: 28 mEq/L (ref 19–32)
Calcium: 9 mg/dL (ref 8.4–10.5)
Creatinine, Ser: 1.84 mg/dL — ABNORMAL HIGH (ref 0.4–1.2)
GFR calc Af Amer: 34 mL/min — ABNORMAL LOW (ref >60)
GFR calc non Af Amer: 28 mL/min — ABNORMAL LOW (ref >60)

## 2010-12-13 NOTE — Op Note (Signed)
NAMERochele Mitchell NO.:  [id number]   MEDICAL RECORD NO.:  [id number]          PATIENT TYPE:  INP   LOCATION:  1615                         FACILITY:  [facility]   PHYSICIAN:  Lauretta Grill, M.D.    DATE OF BIRTH:  May 13, 1946   DATE OF PROCEDURE:  06/16/2008  DATE OF DISCHARGE:                               OPERATIVE REPORT   PREOPERATIVE DIAGNOSIS:  Status post right knee arthroplasty, DePuy with  rotating platform 4-1/2 years ago with loosening of tibial component.   POSTOPERATIVE DIAGNOSES:  Status post right knee arthroplasty, DePuy  with rotating platform 4-1/2 years ago with loosening of tibial  component with some metal synovial staining.  No sign of infection.   PROCEDURE:  Right knee revision with replacement of tibial component to  long NBT stem and rotating platform.   SURGEON:  1. Hiram Comber, M.D.   ASSISTANT:  Billey Chang, P.A.   ANESTHESIA:  General with femoral nerve block.   CULTURES:  Aerobic and anaerobic.   DRAINS:  Ten medium Hemovacs to self suction.   SPECIMEN:  Frozen section which was negative for white cells per high  power field.   ESTIMATED BLOOD LOSS:  Minimal.   TOURNIQUET TIME:  77 minutes.   PATHOLOGY FINDINGS/HISTORY:  Donah Driver had a successful right total  knee arthroplasty using primary DePuy components with rotating platform  4-1/2 years ago.  In the last year, she has had progressive knee pain  and x-rays recently showed evidence of sinking into varus of the tibial  component.  The femur looked tight.  There was no sign of infection.  It  was therefore elected to proceed with revision arthroplasty.  At  surgery, the femur was intact.  There was some metal staining in the  synovium, no overt evidence of any active infection.  We did send  synovium for white cells per high power field and it came back negative,  except for the metal staining but no inflammation with cells.  We did  send cultures off.  Her  tibia was loose.  It was sunk medially and not  bonded to the cement.  The bone cement interface was intact.  The cement  was removed.  We re-angled the tibial stem into some varus to affect leg  valgus and fitted her with a 75 x 14 mm stem on a 2.5 tibial tray.  We  first thought the 17.5 rotating platform was appropriate, but, in final  closure felt the 20 mm worked better for stability and full flexion and  extension.  She had full range of motion and good stability and these  were what was implanted.  She has a penicillin-allergy, so we used  vancomycin in the cement and IV perioperatively.  Gentamicin per our  protocol injecting into the knee at closure into the Hemovac.   PROCEDURE:  Adequate anesthesia was obtained using LMA technique after  femoral nerve block.  The patient was placed in the supine position.  The right lower extremity was prepped from the toes to the tourniquet in  the standard fashion.  After standard prepping and draping, Esmarch  exsanguination was used and the tourniquet was let up to 350 mmHg.  The  old skin incision was then incised.  The incision was deepened sharply  with a knife and hemostasis obtained using the Bovie electrocoagulator.  Flaps were developed medially and laterally.  The patella was everted  and scar and stained synovium were excised.  We then checked the femur  and found it to be stable.  I then, with an osteotome, separated the  stem from the rotating platform and removed it.  Further posterior  synovectomy was carried out.  I then put an osteotome underneath the  tibial component and it came out rather easily.  I then removed cement  down the stem.  I then placed an intramedullary guide and reamed up to a  14 and then made the cut in the appropriate alignment just beneath the  cement.  We then sized to a 2.5, further weighing proximally with a  conical reamer and then placed a keel punch and then the trial.  I then  trialed with a 17.5  and it seemed to be a good fit, but, on the final  implant it was a bit loose, so we went up to a 20 trial and then used  the 20.  We had already opened the 17.5.  I then removed the trial  components and thoroughly gently lavaged the knee and checked the  component sizing as they came on the field.  Cement was mixed with the  vancomycin in the cement bowel.  We then assembled the tibia component  with the 17.5 x 14 mm stem and the 2.5 platform.  We then cemented on  the tibial component and packed it and removed excess cement.  We then  put in a 17.5 articulated  the knee through a range of motion, then  trialed a 20 and then put the 20 mm in.  We held the knee in full  extension while the cement cured and thorough gentle lavage was carried  out that was pre and post implantation.  When the cement had cured, the  tourniquet was let down and bleeding points cauterized.  FloSeal was  placed in the wound.  Hemovac drains were placed in the mediolateral  gutter and brought out the superolateral port.  The wound was then  closed in layers with #1-Vicryl figure of eight on the retinaculum with  a running locking over sewed with #1-PDS.  Zero and 3-0 Vicryl were then  used on the subcu with skin staples.  Hemovac was hooked up to Autovac.  A sterile compressive dressing was applied with a knee immobilizer and  the patient, having tolerated the procedure well, was awakened and taken  to the recovery room in satisfactory condition to be started on retained  CPM and analgesia.      Lauretta Grill, M.D.  Electronically Signed     VEP/MEDQ  D:  06/16/2008  T:  06/16/2008  Job:  [id number]   cc:   Eden Lathe. Einar Gip, MD  Fax: [phone]   Jenny Reichmann, MD Orson Gear,

## 2010-12-13 NOTE — Procedures (Signed)
CAROTID DUPLEX EXAM   INDICATION:  Follow up known carotid artery disease.   HISTORY:  Diabetes:  No.  Cardiac:  No.  Hypertension:  Yes.  Smoking:  No.  Previous Surgery:  Right carotid endarterectomy in 05/10/07.  CV History:  Amaurosis Fugax No, Paresthesias No, Hemiparesis No.                                       RIGHT             LEFT  Brachial systolic pressure:         140               130  Brachial Doppler waveforms:         Biphasic          Biphasic  Vertebral direction of flow:        Antegrade         Antegrade  DUPLEX VELOCITIES (cm/sec)  CCA peak systolic                   63                [id number]  ECA peak systolic                   256               [id number]  ICA peak systolic                   Occluded          69  ICA end diastolic                   Occluded          32  PLAQUE MORPHOLOGY:                  Heterogenous      Heterogenous  PLAQUE AMOUNT:                      Severe            Moderate  PLAQUE LOCATION:                    ICA, ECA          ICA, ECA   IMPRESSION:  1. Occluded right internal carotid artery.  2. 40-59% stenosis noted in the left internal carotid artery.  3. Antegrade bilateral vertebral arteries.   ___________________________________________  [name] [name], M.D.   MG/MEDQ  D:  08/11/2008  T:  08/11/2008  Job:  [id number]

## 2010-12-13 NOTE — Op Note (Signed)
NAMEDonah Mitchell               ACCOUNT NO.:  [id number]   MEDICAL RECORD NO.:  [id number]          PATIENT TYPE:  AMB   LOCATION:  DAY                           FACILITY:  [facility]   PHYSICIAN:  Caro Hight, M.D.      DATE OF BIRTH:  [birth year]   DATE OF PROCEDURE:  09/24/2008  DATE OF DISCHARGE:                               OPERATIVE REPORT   REFERRING Emily Mitchell:  Emily Chessman, MD   PROCEDURE:  1. Colonoscopy with snare cautery, and cold forceps polypectomy.  2. Esophagogastroduodenoscopy with cold forceps biopsy.   INDICATION FOR EXAM:  Emily Mitchell is a 65 year old female who presents  with history of gastroesophageal reflux disease.  Her GERD is fairly  well controlled except for 1-2 times a month.  She also had a change in  her bowel habits.  She reports having worsening constipation.  She was  taking pain medication after knee surgery.   FINDINGS:  1. Melanosis coli.  Three 3-4-mm sessile transverse colon polyps      removed via cold forceps.  One 6-mm sessile transverse colon polyp      removed via snare cautery.  2. Frequent sigmoid colon diverticula.  Otherwise, no masses,      inflammatory changes, or AVM seen.  3. Multiple hyperplastic-appearing polyps seen in the rectum.  They      were removed via cold forceps.  They range form 3 mm to 6 mm.  No      internal hemorrhoids.  4. Normal Z-line.  A 6-mm x 2-mm island of salmon-colored mucosa seen      above the Z-line.  Otherwise, no evidence of erosions, ulcerations,      masses, or strictures.  5. A 4-mm sessile gastric polyp seen in the mid body of the stomach.      Biopsy is obtained via cold forceps.  Otherwise, no evidence of      erosions or ulcerations.  6. A 6-mm x 3-mm sessile duodenal polyps seen in the bulb.  Biopsies      obtained via cold forceps.  Normal second portion of the duodenum.   DIAGNOSES:  1. Multiple colorectal polyps.  2. Mild sigmoid colon diverticulosis.  3. Salmon-colored tongue  possibly secondary to Barrett's, biopsies      obtained.  4. Gastric polyp.  5. Duodenal polyp.   RECOMMENDATIONS:  1. No aspirin, NSAIDs, or anticoagulation for 7 days.  2. We will await for biopsies.  She will likely need the polyps      removed via endoscopic ultrasound.  3. If her polyps are simple adenomas then could consider screening      colonoscopy in 10 years.  4. High-fiber diet.  She is given a handout on high-fiber diet,      polyps, and diverticulosis.  5. She should continue taking her omeprazole 20 mg 30 minutes before      meals.  6. Follow up with Dr. Vickey Mitchell in 2 months.   MEDICATIONS:  Propofol provided by Anesthesia.   PROCEDURE TECHNIQUE:  Physical exam was performed.  Informed consent was  obtained from the patient after explaining the benefits, risks, and  alternatives to the procedure.  The patient was connected to the monitor  and placed in left lateral position.  Continuous oxygen was provided by  nasal cannula, IV medicine administered through an indwelling cannula.  After administration of sedation and rectal exam, the patient's rectum  was intubated and the scope was advanced under direct visualization to  the cecum.  The scope was removed slowly by carefully examining the  color, texture, anatomy, and integrity of the mucosa on the way out.   After the colonoscopy, the patient's esophagus was intubated with a  diagnostic gastroscope, and the scope was advanced under direct  visualization to the second portion of the duodenum.  The scope was  removed slowly by carefully examining the color, texture, anatomy, and  integrity of the mucosa on the way out.  The patient was recovered in  Endoscopy and discharged home in satisfactory condition.   PATH:  Simple adenomas. Hyperplastic rectal polyps. No Barrett's. Benign  gastric polyps. Benign duodenal mucosa.      Caro Hight, M.D.  Electronically Signed     SM/MEDQ  D:  09/24/2008  T:   09/25/2008  Job:  [id number]   cc:   Emily Mitchell, M.D.  Fax: [phone]

## 2010-12-13 NOTE — Assessment & Plan Note (Signed)
OFFICE VISIT   Emily Mitchell  DOB:  Jun 29, 1946                                       11/19/2007  [id number]   The patient returns for routine followup following right carotid  endarterectomy for severe asymptomatic right internal carotid stenosis.  Surgery was May 10, 2007.  She had no neurologic symptoms since her  discharge from hospital including hemiparesis, aphasia, amaurosis fugax,  diplopia, blurred vision, syncope or otherwise.  She had no chest pain,  dyspnea on exertion or cardiac symptoms and takes one aspirin per day.   PHYSICAL EXAM:  Blood pressure 113/68, heart rate 84, respirations are  14.  On exam, her carotid incision is well-healed.  Carotid pulses are  to 3+ with no audible bruits.  Neurologic:  Normal.   Surprisingly, her carotid duplex exam suggests that her right ICA is  totally occluded following her right carotid endarterectomy 6 months  ago.  Left internal carotid is widely patent.   I am not certain when she occluded the right ICA.  She certainly is  asymptomatic and must obviously have excellent flow coming over from the  left side to fill her right hemisphere.  It is uncommon to occlude right  internal carotid artery following surgery within the first 6 months, and  if so it usually causes symptoms, which she has not experienced.  Because her chronic renal insufficiency, I do not want to perform a CT  angiogram since nothing would be done anyway.  We will continue to  follow her closely in the office.  If she develops any symptoms, she  will be in touch with me.  Otherwise will see her in 3 months with  followup carotid duplex exam that time.   [name] [name], M.D.  Electronically Signed   JDL/MEDQ  D:  11/19/2007  T:  11/20/2007  Job:  [id number]   cc:   Quay Burow, M.D.  Halford Chessman, M.D.

## 2010-12-13 NOTE — Procedures (Signed)
CAROTID DUPLEX EXAM   INDICATION:  Carotid disease.   HISTORY:  Diabetes:  No  Cardiac:  No  Hypertension:  Yes  Smoking:  No  Previous Surgery:  Right carotid endarterectomy on 05/10/2007.  CV History:  Currently asymptomatic.  Amaurosis Fugax No, Paresthesias No, Hemiparesis No                                       RIGHT             LEFT  Brachial systolic pressure:         124               118  Brachial Doppler waveforms:         normal            normal  Vertebral direction of flow:        antegrade         antegrade  DUPLEX VELOCITIES (cm/sec)  CCA peak systolic                   67                123456  ECA peak systolic                   137               123456  ICA peak systolic                   occluded          123XX123  ICA end diastolic                                     37  PLAQUE MORPHOLOGY:                  mixed             mixed  PLAQUE AMOUNT:                      occlusive         moderate  PLAQUE LOCATION:                    ICA/ECA/CCA       ICA/CCA   IMPRESSION:  1. Known occlusion of the right internal carotid artery.  2. A 40- 59% stenosis of the left internal carotid artery.  3. No significant change noted when compared to the previous      examination on 08/11/2008.   ___________________________________________  [name]. [name], M.D.   CH/MEDQ  D:  08/17/2009  T:  08/17/2009  Job:  [id number]

## 2010-12-13 NOTE — Assessment & Plan Note (Signed)
OFFICE VISIT   Emily Mitchell  DOB:  1946/02/16                                       05/21/2007  [id number]   Patient underwent a right carotid endarterectomy on October 10th for  severe right internal carotid artery stenosis, which was demonstrated to  be 85-90% by cerebral angiography.  She had an event which required  hospitalization and cardiac catheterization, which was unremarkable.  The event involved some weakness in her right arm and possible visual  problems in the left eye, although these were vaguely described.  It was  not felt that this right carotid lesion was symptomatic.  She has done  well with her sensory surgery with no hemispheric or nonhemispheric  TIAs.  She is taking one aspirin per day.   PHYSICAL EXAMINATION:  Blood pressure 120/70, heart rate 76,  respirations 18, carotid pulses are 3+ with no bruits audible.  Right  neck incision has healed nicely.  Neurologic:  Normal.   I am pleased with her early result.  Encouratged her to resume her  normal activities as tolerated.  She will see me in six months for a  follow-up carotid duplex exam in our office.   [name] [name], M.D.  Electronically Signed   JDL/MEDQ  D:  05/21/2007  T:  05/22/2007  Job:  [id number]   cc:   Halford Chessman, M.D.  Leslye Peer, MD

## 2010-12-13 NOTE — Consult Note (Signed)
NAMEDonah Mitchell               ACCOUNT NO.:  [id number]   MEDICAL RECORD NO.:  [id number]          PATIENT TYPE:  INP   LOCATION:  3731                         FACILITY:  [facility]   PHYSICIAN:  [name]. [name], M.D.  DATE OF BIRTH:  January 15, 1946   DATE OF CONSULTATION:  05/08/2007  DATE OF DISCHARGE:                                 CONSULTATION   CHIEF COMPLAINT:  Severe carotid occlusive disease - asymptomatic.   HISTORY OF PRESENT ILLNESS:  This 65 year old Caucasian female was  admitted on May 07, 2007 for cardiac catheterization.  She had  described some occasional chest discomfort over the past 1-2 months and  had a borderline Myoview study, and cardiac catheterization revealed  normal LV function with no significant stenotic lesions in the coronary  arteries.  She was also known to have carotid disease with bilateral  carotid Dopplers having been performed, suggesting an 80-90% right  internal carotid stenosis and a 50-60% left internal carotid stenosis.  She had no previous history of stroke, although had had an episode of  previous clumsiness or weakness in the right upper extremity a few weeks  ago which was vaguely described.  She also describes some occasional  visual loss in the left eye and has previously had left cataract  surgery.  Carotid cerebral angiograms were performed by Dr. Einar Gip on  May 08, 2007 which confirmed a severe right internal carotid stenosis  and a mild left internal carotid stenosis.  Vascular surgery  consultation was obtained.   PAST MEDICAL HISTORY:  1. COPD.  2. Borderline diabetes mellitus.  3. Hypertension.  4. Hyperlipidemia.  5. Hypothyroidism.  6. Depression and anxiety.  7. Mild renal insufficiency followed by Dr. Marval Regal.   ALLERGIES:  PENICILLIN AND SULFA.   PAST SURGICAL HISTORY:  1. Hysterectomy.  2. Right knee replacement.   MEDICATIONS:  Please see history and physical.   REVIEW OF SYSTEMS:  The patient does  describe weakness in both lower  extremities with ambulation but also shortness of breath and generalized  fatigue which limit her to walking about one-half block.  Denies any  PND, orthopnea, or other active cardiac symptoms other than mild chest  discomfort.  Also denies any GI or GU symptoms at this time.   SOCIAL HISTORY:  The patient smoked a pack of cigarettes per day for 30+  years and quit in 1996.  She does not use alcohol.  She is married and  has been for 1 year.   FAMILY HISTORY:  Positive for diabetes mellitus in a sister, mini  strokes in her mother, and negative for coronary artery disease.   PHYSICAL EXAMINATION:  VITAL SIGNS:  Blood pressure 110/70, heart rate  60, respirations 16.  GENERAL:  She is a middle-aged female in no apparent distress, alert and  oriented x3.  NECK:  Supple, 3+ carotid pulses palpable.  There were bruits  bilaterally.  No palpable adenopathy in the neck.  NEUROLOGIC:  Normal.  CHEST:  Clear to auscultation.  ABDOMEN:  Obese.  No palpable masses.  EXTREMITIES:  She has 3+ femoral, popliteal, and  2+ dorsalis pedis  pulses palpable bilaterally.   Carotid duplex exam and cerebral angiograms have been reviewed, and I  agree that she does have a focal severe right internal carotid stenosis,  approximating 80-90%, mild left internal carotid occlusive disease.   IMPRESSION:  1. Asymptomatic severe right internal carotid stenosis.  2. No significant coronary artery disease by recent cardiac      catheterization.   RECOMMENDATIONS:  Proceed with a right carotid endarterectomy for this  severe asymptomatic lesion.  I have discussed the risks and benefits  with her and her husband, and they would like to proceed during this  hospitalization.  If all were in agreement, we can perform this on  Friday,  May 10, 2007.      [name] [name], M.D.  Electronically Signed     JDL/MEDQ  D:  05/08/2007  T:  05/09/2007  Job:  [id number]   cc:   Leslye Peer, MD  Quay Burow, M.D.

## 2010-12-13 NOTE — Procedures (Signed)
CAROTID DUPLEX EXAM   INDICATION:  Followup of known carotid artery disease.  Patient is still  experiencing a shading or blurriness of both left and right eyes;  however, left eye more frequently, each episode lasting <1 minute for  several months.   HISTORY:  Diabetes:  No.  Cardiac:  No.  Hypertension:  Yes.  Smoking:  No.  Previous Surgery:  Right CEA with DPA on 05/10/07 by Dr. [name].  CV History:  Amaurosis Fugax ?, Paresthesias Yes, Hemiparesis Yes.  Patient complains of episodic left arm weakness and tingling of her left  fourth and fifth fingers for the past three days.                                       RIGHT             LEFT  Brachial systolic pressure:         108               104  Brachial Doppler waveforms:         Biphasic          Biphasic  Vertebral direction of flow:        Antegrade         Antegrade  (resistive)  DUPLEX VELOCITIES (cm/sec)  CCA peak systolic                   67                0000000  ECA peak systolic                   199               [id number]  ICA peak systolic                   Occluded          123456  ICA end diastolic                   Occluded          52  PLAQUE MORPHOLOGY:                  Homogenous        Heterogenous  PLAQUE AMOUNT:                      Large             Mild-moderate  PLAQUE LOCATION:                    ICA               Bifurcation, ICA   IMPRESSION:     1.  Known occluded right internal carotid artery.     2.  Somewhat tortuous left internal carotid artery.     3.  Left 40-59% internal carotid artery stenosis.  Increased         velocities noted throughout internal carotid artery, may be due         to compensatory flow.     4.  Bilateral antegrade flow in vertebral arteries; however, left         vertebral artery demonstrates a resistive waveform.      ___________________________________________  [name] [name], M.D.  PB/MEDQ  D:  02/18/2008  T:  02/18/2008  Job:  [id number]

## 2010-12-13 NOTE — Cardiovascular Report (Signed)
NAMEDonah Mitchell               ACCOUNT NO.:  [id number]   MEDICAL RECORD NO.:  [id number]          PATIENT TYPE:  INP   LOCATION:  3731                         FACILITY:  [facility]   PHYSICIAN:  Eden Lathe. Einar Gip, MD       DATE OF BIRTH:  1946/07/09   DATE OF PROCEDURE:  05/08/2007  DATE OF DISCHARGE:                            CARDIAC CATHETERIZATION   PROCEDURE PERFORMED:  1. Hemodynamic monitoring of the left ventricle.  2. Select right and left coronary arteriography.   INDICATIONS:  Ms. Emily Mitchell is a 65 year old female with history of  hypertension, hyperlipidemia, diabetes, hypothyroidism, COPD and chronic  renal insufficiency.  She has been having chest discomfort.  She had  undergone stress Myoview which had revealed anterior and inferior wall  ischemia.  Given her multiple cardiac risk factors, she was brought to  the cardiac catheterization lab to evaluate her coronary anatomy.  She  was admitted to the hospital previously for IV hydration to reduce the  risk of contrast nephropathy.   HEMODYNAMIC DATA:  The left ventricular pressure of 144/117 with end of  pressure of 13 mmHg.  Aortic pressure of 138/59 with a mean of 92 mmHg.  There was no pressure gradient across the aortic valve.   ANGIOGRAPHIC DATA.:  Left ventricle.  Left ventriculography was not  performed.   Right coronary artery:  Right coronary artery was of anterior and  superior origin.  I was able to cannulate this with JR-4.  The right  coronary artery is smooth, normal and is a dominant vessel.  The right  coronary artery has a smooth 20% proximal stenosis, but otherwise no  significant disease.   Left main:  Left main is a large vessel that is smooth and normal.   LAD:  The LAD is a large-caliber vessel.  Ostium and proximal segment  has a 20% stenosis followed by a 30% stenosis at the origin of a large  diagonal two.  Otherwise, mild luminal irregularity but no significant  stenosis.   Circumflex  coronary artery:  Circumflex coronary artery gives origin to  small obtuse marginal one.  It has mild luminal irregularity.   IMPRESSION:  No significant coronary arteries by cardiac catheterization.  There is a  20% ostial and proximal LAD stenosis followed by 30% mid stenosis at the  origin of a large diagonal two.  There is also mild luminal irregularity  of the circumflex and right coronary artery.   The right coronary artery is a anterior and a superior origin but is  otherwise smooth and normal except for a 20% proximal stenosis.   RECOMMENDATIONS:  Evaluation for noncardiac cause of chest pain is  indicated with continued aggressive risk modification especially given  the soft plaque that was noted in the coronaries is indicated.   The patient has also known carotid stenosis.  Will attempt to do a  carotid angiography later today if the schedule does permit.   A total of 30 mL of contrast was utilized for diagnostic angiography.   TECHNIQUE OF THE PROCEDURE:  Under usual sterile precautions using a 6-  French right femoral arterial access vein, multipurpose B2 catheter was  advanced into the ascending aorta and then into the left ventricle over  a J-wire.  Left ventricle hemodynamics was performed, and the catheter  was pulled into the ascending aorta.  The catheter was pulled out of  body and a 6-French Judkins left four diagnostic catheter was utilized  in the left main coronary artery and angiography was performed.  The  catheter was then pulled out of body over a J-wire and a 6-French  Judkins right four diagnostic catheter was utilized to engage the right  coronary artery and angiography repeated.  The catheter was pulled out  of the body.  The access sheath was sutured in place.  The patient  tolerated the procedure well.  No immediate complication.      Eden Lathe. Einar Gip, MD  Electronically Signed     JRG/MEDQ  D:  05/08/2007  T:  05/08/2007  Job:  [id number]   cc:    Leslye Peer, MD  Halford Chessman, M.D.

## 2010-12-13 NOTE — Assessment & Plan Note (Signed)
OFFICE VISIT   Emily Mitchell  DOB:  1946/07/29                                       08/11/2008  [id number]   The patient returns today for continued followup regarding her carotid  occlusive disease.  She underwent a right carotid endarterectomy by me  in October of 2008 for severe but asymptomatic right internal carotid  stenosis.  When she returned for her visit she had total occlusion of  her right internal carotid artery which is asymptomatic.  She has  continued to be followed with no change in her carotid duplex studies.  Today's study continues to reveal about a 50% left internal carotid  stenosis with total occlusion of the right internal carotid artery.  She  does continue to describe a variety of symptoms as she has in the past.  Today she states that her right arm becomes numb and cold on occasions,  lasting up to 30 minutes and this has been going on for the past 4  weeks.  She also states that occasionally she cannot come up with the  right words while speaking.  She has a tremor which she has seen Dr. Katherina Right for in the past.  She has also seen a neurologist at [facility] in the past 6 months and who obtained an MRI scan, the results  of which I do not have.  She continues to take one aspirin per day.  She  ambulates about one block but states that occasionally she is unsteady.   PHYSICAL EXAMINATION:  On physical exam blood pressure is 140/70 in the  right upper extremity and 130/70 in the left extremity.  Carotid pulses  are 3+ with soft bruit on the left.  Neurologic exam is unremarkable  except for a fine tremor in both upper extremities.  Both upper  extremities have 3+ brachial and radial pulses and well perfused hands.  Abdomen:  Soft, nontender with no masses.  She has 3+ femoral, popliteal  and dorsalis pedis pulses bilaterally.   I have reassured her regarding her findings today.  There has been no  change in  her carotid occlusive disease which is stable.  She also has  excellent circulation of both upper extremities.  I did recommend that  if she has further changes in her neurologic situation she should see a  neurologist either at [facility] or Dr. Erling Cruz.  Otherwise we will see her  in 1 year with followup carotid duplex exam at that time.   [name] [name], M.D.  Electronically Signed   JDL/MEDQ  D:  08/11/2008  T:  08/12/2008  Job:  [id number]

## 2010-12-13 NOTE — Discharge Summary (Signed)
NAMEDonah Mitchell               ACCOUNT NO.:  [id number]   MEDICAL RECORD NO.:  [id number]          PATIENT TYPE:  INP   LOCATION:  3312                         FACILITY:  [place]   PHYSICIAN:  [name]. [name], M.D.  DATE OF BIRTH:  [birth year]   DATE OF ADMISSION:  05/06/2007  DATE OF DISCHARGE:  05/11/2007                               DISCHARGE SUMMARY   PROCEDURES PERFORMED:  1. Cardiac catheterization on May 08, 2007.  2. Cerebral angiography on May 08, 2007.  3. Right carotid endarterectomy with Dacron patch angioplasty on      May 10, 2007 by Dr. [name].   CHIEF COMPLAINT:  Chest pain and severe right carotid stenosis.   HISTORY OF PRESENT ILLNESS:  This 65 year old female patient has history  of hypertension, hyperlipidemia, diabetes, hypothyroidism, COPD, and  chronic renal insufficiency.  She was having chest discomfort and had  undergone a stress Myoview which revealed some anterior and inferior  wall ischemia.  She was admitted for cardiac workup and after proper  hydration underwent cardiac catheterization which revealed no severe  coronary artery disease and an ejection fraction of 70%.  She had a 30%  mid stenosis at the origin of the large diagonal and some mild  irregularity but otherwise no significant lesions.  She had been found  to have carotid disease bilaterally on carotid duplex exam, and carotid  angiography was performed by Dr. Quay Burow on May 08, 2007  following the cardiac catheterization.  This revealed 90% focal right  internal carotid stenosis and an approximate 30-40% left internal  carotid stenosis, and vascular surgery consultation was obtained.   HOSPITAL COURSE:  Dr. [name] saw the patient in consultation on May 08, 2007, and with the cardiac workup being negative, he recommended a  right carotid endarterectomy for this asymptomatic severe right internal  carotid stenosis.  The patient denied any right brain symptoms such  as  hemiparesis, aphasia, amaurosis fugax, diplopia, blurred vision, or  syncope.  She had some vague symptoms involving her left eye and right  arm which were not consistent with her lesions and did not sound like  TIAs.   PAST MEDICAL HISTORY:  As described, with COPD, diabetes mellitus,  hypertension, hyperlipidemia, hypothyroidism, depression, and mild renal  insufficiency.   ALLERGIES:  PENICILLIN AND SULFA.   PAST SURGICAL HISTORY:  1. Hysterectomy.  2. Right knee replacement.   ADMISSION LABORATORY DATA:  creatinine was 1.6, potassium 3.9, BUN of  35.  White count 7900, hematocrit 33%.   EKG revealed normal sinus rhythm, no evidence of ischemia.  Chest x-ray  revealed some cardiomegaly without acute findings.   The patient underwent an uneventful right carotid endarterectomy on  October 10 by Dr. [name].  She awoke with neurologic exam intact in the  PACU and was transferred to 3300.  She was stable hemodynamically and  overnight remained neurologically intact.  IVs were discontinued on the  first postoperative day.  She ambulated well, swallowed without  difficulty, and was discharged home.   CONDITION ON DISCHARGE:  Improved.   SURGICAL PROCEDURES:  As above,  right carotid endarterectomy with Dacron  patch angioplasty by Dr. [name] on May 10, 2007.   FINAL DIAGNOSES:  Severe right internal carotid stenosis - asymptomatic.  Other diagnoses as listed in the admissions diagnoses.   DISCHARGE MEDICATIONS:  1. Diovan 160 mg p.o. q.a.m.  2. Citalopram 40 mg p.o. daily.  3. Nexium 40 mg p.o. daily.  4. Armour Thyroid 180 mg p.o. 2 q.a.m.  5. Singulair 10 mcg p.o.  6. Zetia 10 mg p.o. daily.  7. Baby aspirin 81 mg p.o. daily.  8. Hydrochlorothiazide 25 mg p.o. daily.  9. Alendronate 70 mg p.o. weekly.  10.Advair 250/50 2 puffs daily.  11.Vicodin 5/50 1 p.o. q.h.s.  12.Spiriva hand inhaler daily.  13.Tylox one p.o. q.6h. p.r.n. for pain.   DISCHARGE  INSTRUCTIONS:  The patient will return to see Dr. [name] in 2  weeks for followup in the office.  She is not to drive an automobile.  She is able to shower on the second postoperative day and increase her  activity as tolerated.      [name] [name], M.D.  Electronically Signed     JDL/MEDQ  D:  05/11/2007  T:  05/12/2007  Job:  [id number]   cc:   Leslye Peer, MD  Eden Lathe. Einar Gip, MD

## 2010-12-13 NOTE — Op Note (Signed)
NAMEDonah Mitchell               ACCOUNT NO.:  [id number]   MEDICAL RECORD NO.:  [id number]          PATIENT TYPE:  INP   LOCATION:  5024                         FACILITY:  [place]   PHYSICIAN:  Kathalene Frames. Mayer Camel, M.D.   DATE OF BIRTH:  Aug 18, 1945   DATE OF PROCEDURE:  03/29/2009  DATE OF DISCHARGE:                               OPERATIVE REPORT   PREOPERATIVE DIAGNOSIS:  End-stage arthritis of the left knee with  morbid obesity.   POSTOPERATIVE DIAGNOSIS:  End-stage arthritis of the left knee with  morbid obesity.   PROCEDURE:  Complex left total knee arthroplasty using a stemmed  revision tibial implant, DePuy Sigma RP components, 3 femur, 3 tibia MBT  with a 75 x 14 stem, 10-mm Sigma RP bearing, 35-mm patellar button.  All  components cemented, double batch of DePuy HV cement with 1500 mg of  Zinacef.   SURGEON:  Kathalene Frames. Mayer Camel, MD   FIRST ASSISTANT:  Leafy Kindle PA-C   ANESTHETIC:  spinal anesthetic.   ESTIMATED BLOOD LOSS:  Minimal.   FLUID REPLACEMENT:  1500 mL of crystalloid.   DRAINS PLACED:  Foley catheter and 2 medium Hemovacs.   TOURNIQUET TIME:  1 hour and 40 minutes.   INDICATIONS FOR PROCEDURE:  A 65 year old woman who had previously been  followed by Dr. Eddie Dibbles and had a LCS rotating platform knee on the right  side that did well for 4-1/2 years and then it was revised to an MBT  tibia with a 14-mm stem after the tibia came loose at the 4-1/2-year  mark.  She now has end-stage arthritis of the left knee, desires left  total knee arthroplasty.  She remains morbidly obese and because of her  previous history we are going to go ahead and do a revision tibial stem  on the left side.  She has end-stage arthritis by x-ray, severe  unremitting pain that wakes her night and prevents normal activities and  again she desires elective total knee arthroplasty.  Risks and benefits  of surgery are discussed, questions are answered.  She also has chronic  renal  failure and is dependent on oxygen at home and will ought to be  monitored closely from medical point of view.   DESCRIPTION OF PROCEDURE:  The patient was identified by armband and  received preoperative IV vancomycin in the holding area at [facility].  She was then taken to operating room #1 where the appropriate  anesthetic monitors were attached and spinal anesthesia was induced.  After the successful induction of the spinal, a Foley catheter was  inserted.  Lateral post and foot positioner were applied to the table.  Tourniquet applied high totally left thigh and this was the wide Zimmer  tourniquet and the left lower extremity was prepped and draped in the  usual sterile fashion from the ankle to the tourniquet.  A time-out  procedure was then performed.  The limb was wrapped with an Esmarch  bandage.  The tourniquet was inflated to 350 mmHg.  We began the  operation itself by making an anterior midline incision  and centered  over the patella about 18-20 cm in length through the skin and  subcutaneous tissue.  We reflected the transverse retinaculum medially  and laterally to allow [place] to tuck the everted patella beneath the fat  pad and then performed a medial parapatellar arthrotomy.  The  superficial medial collateral ligament was elevated from anterior to  posterior off the proximal tibia leaving intact distally to allow for  the correction of the varus deformity.  The knee was then hyperflexed.  The patella everted and tucked underneath the lateral fat pad, and the  prepatellar fat pad was resected as well as the anterior one half of the  menisci.  The ACL and PCL were resected after first removing large notch  osteophytes that made the notch quite stenotic with a 3/4-inch  osteotomes as well as the tibial spines.  This allowed placement of a  posteromedial Z retractor and McCulloch retractor through the notch and  lateral Homan retractor.  We then entered the proximal  tibia with a  DePuy step drill followed by sequentially reaming up to a 14-mm reamer  to the appropriate depth for a 75 stem with a reamer in place.  We then  attached to the proximal tibial cutting guide 2 degrees posterior slope  and removed about 6-7 mm of bone medially and 11-12 mm of bone laterally  because of her flexion contracture.  The posterior structures were  protected with previously placed retractors.  We then entered the distal  femur 2 mm anterior to the PCL origin with the DePuy step drill followed  by intramedullary rod and 5-degree left distal femoral cutting guide.  This was set at 11 mm in the distal femoral cut accomplished along the  epicondylar axis.  We then measured for a 3 left femur using the  posterior referencing cutting guide and pinned the guide in 3 degrees of  external rotation.  Chamfer cutting guide was then screwed into place  and the anterior, posterior, and chamfer cuts were accomplished without  difficulty followed by the Sigma RP box cut.  The patella was measured  at 22 mm.  The cutting guide set at 14 in the anticipation of a 32 or 35  patella and the posterior 89 mm of the patella resected, sized for a 35  button and drilled.  We then checked our extension gap and flexion gap.  These were both excellent with the knee hyperflexed.  We then removed a  little bit of bone from the tibial from Gerdy tubercle.  It was on the  high side and sized for #3 MBT base plate which was pinned into place  followed by the smokestack and the conical reamer followed by the reamer  with the stem attached that went to full depth.  We then assembled a 3  MBT trial with a 14 x 75 stem and performed our Delta fin keel punch.  The trial femoral component, a 3 left was then hammered into place.  The  lugs were drilled and we performed a trial reduction with a 10-mm Sigma  RP bearing and a 35-mm button.  The patella tracked with no thumb  pressure.  The knee came to full  extension and the varus deformity was  now gone.  At this point, the trial components were removed.  All bony  surfaces were water-picked, cleaned, and dried with suction and sponges.  A double batch of DePuy HV cement with 1500 mg of Zinacef was then mixed  and applied to all bony and metallic mating surfaces.  In order, we  hammered into place the 3 MBT baseplate with a 75 x 14 stem, a 3 left  femoral component snapped into place the 10-mm Sigma RP bearing after  first removing excess cement and then squeezed a 35 patellar button into  place with a clamp.  Medium Hemovacs were tray placed from the  anterolateral approach and the wound was irrigated out one more time  with normal saline solution.  After the cement hardened, we checked our  tracking one more time and it was excellent.  The parapatellar  arthrotomy was closed with running #1 Vicryl suture, the subcutaneous  tissue with 0 and 2-0 undyed Vicryl suture, and the skin with skin  staples.  A dressing of Xeroform, 4 x 4 dressing sponges, Webril, and  Ace wrap was then applied.  The tourniquet was let down.  The patient  was awakened and taken to the recovery room without difficulty.      Kathalene Frames. Mayer Camel, M.D.  Electronically Signed     FJR/MEDQ  D:  03/29/2009  T:  03/30/2009  Job:  [id number]

## 2010-12-13 NOTE — Discharge Summary (Signed)
NAMEDonah Mitchell               ACCOUNT NO.:  [id number]   MEDICAL RECORD NO.:  [id number]          PATIENT TYPE:  INP   LOCATION:  1615                         FACILITY:  [facility]   PHYSICIAN:  Lauretta Grill, M.D.    DATE OF BIRTH:  [birth year]   DATE OF ADMISSION:  06/16/2008  DATE OF DISCHARGE:  06/19/2008                               DISCHARGE SUMMARY   FINAL DIAGNOSES:  1. Loose tibial component of right total knee arthroplasty.  2. Postoperative blood loss anemia.  3. Hypothyroidism.  4. Hypercholesterolemia.  5. Hyperlipidemia.  6. Panic disorder.  7. Obstructive sleep apnea.   PROCEDURE:  June 16, 2008, revision of right total knee arthroplasty  with replacement of tibial tray with MBP stem cemented and rotator  platform replacement.  Surgeon:  Dr. Maia Breslow.   HISTORY:  This is a 65 year old Caucasian female long term patient of  Dr. Eddie Dibbles.  She had undergone a right total knee arthroplasty a number of  years ago.  She had been doing well until recently when she noticed pain  in her knee and, on x-rays in the office, noted diagonal loosening of  the tibial component.  The pain was unrelieved by medical management.  Because of this, she was scheduled to undergo a revision of her total  knee arthroplasty for replacement of the loose tibial tray and insertion  of an MBP cemented stem and replacing the rotator platform.  She  subsequently was scheduled for this.   HOSPITAL COURSE:  On June 16, 2008, the patient was admitted to  [facility] where she underwent revision of her total knee  arthroplasty of the right knee with replacement of the tibial tray with  the MBP cemented stem and replacement of the rotator platform.  The  patient tolerated the procedure well.  Postoperatively, the patient did  well overall.  No untoward events occurred during her stay.  She did  have postoperative blood loss anemia and at the time of discharge her  laboratory work  showed her CBC was 11.3, her hemoglobin was 8.5 and  hematocrit 25.6, platelet count was 198,000.  She was stable otherwise.  She was able to get out of bed without assistance and she was ready for  discharge on June 19, 2008.  She has worked with physical therapy  and did well with them.  Overall, she did well.  Her pain was managed.  She was prepared for discharge on June 19, 2008.   DISCHARGE MEDICATIONS:  At the time of discharge, the patient's  medications were:  1. Trilipix 135 mg q.a.m.  2. Citalopram 40 mg q.a.m.  3. Singulair 10 mg q.a.m.  4. Zetia 10 mg q.a.m.  5. Clonazepam 1 mg q.a.m.  6. Synthroid 0.125 mg q.a.m.  7. Aspirin 81 mg q.a.m.  8. She is taking eye vitamins, multivitamins.  9. Fosamax 70 mg once a week.  10.Nexium 20 mg q.a.m.  11.She uses Advair Diskus 250/50 one puff twice a day.  12.Spiriva HandiHaler one puff q.a.m.  13.NitroQuick sublingual if needed.  14.Aranesp injection once a month.  15.She was on oxygen 2L with a CPAP machine every night for      obstructive sleep apnea.  16.Albuterol inhaler p.r.n.  17.She was given Percocet 5/325, one to two p.o. q.4-6h. p.r.n. for      pain, #50 of these, no refills.  18.She was given Robaxin 750 mg to take one q.i.d. p.r.n. for muscle      spasms.  19.Lovenox subQ 30 mg q.12h. for the next 10 days.   DISPOSITION:  The patient was subsequently discharged home in stable,  satisfactory condition.   FOLLOWUP:  She is to follow up with Dr. Eddie Dibbles in 10 days.      Billey Chang, Payton Spark, M.D.  Electronically Signed    CL/MEDQ  D:  06/19/2008  T:  06/19/2008  Job:  [id number]

## 2010-12-13 NOTE — Cardiovascular Report (Signed)
NAMERochele Raring NO.:  [id number]   MEDICAL RECORD NO.:  [id number]          PATIENT TYPE:  INP   LOCATION:  [id number]                         FACILITY:  [place]   PHYSICIAN:  Quay Burow, M.D.   DATE OF BIRTH:  [birth year]   DATE OF PROCEDURE:  05/08/2007  DATE OF DISCHARGE:                            CARDIAC CATHETERIZATION   Ms. Emily Mitchell is a 65 year old female patient of Dr. Martie Round who  underwent diagnostic coronary angiography today by Dr. Einar Gip revealing  noncritical CAD.  She has positive carotid Dopplers suggesting high-  grade right ICA stenosis, though she is neurologically asymptomatic.  Other problems include hypertension, hyperlipidemia, borderline diabetes  and mild chronic renal insufficiency.  She has COPD.  She presents now  for cerebral angiography to define her carotid stenosis.   DESCRIPTION OF PROCEDURE:  The patient was brought to the second floor  PV angiographic suite in a postabsorptive state.  She was premedicated.  Right groin was prepped and shaved in usual sterile fashion.  A double-  glove sterile technique was used to exchange the 6-French sheath.  A 6-  French pigtail catheter as well as JB-1 catheter were used for arch  angiography and selective cerebral angiography.  Visipaque dye was used  for the entirety of the case.  Retrograde aortic pressures monitored  during the case.   ANGIOGRAPHIC RESULTS:  1. Arch aortogram; type 1 arch.  2. Right carotid; 90% eccentric proximal right ICA stenosis.  This did      not fill the anterior cerebral.  3. Right __________; this was dominant and filled both posterior      cerebral arteries as well as basilar.  4. Left carotid; 30% irregular proximal left ICA stenosis.  Left      carotid filled both anterior cerebral arteries.   IMPRESSION:  Ms. Emily Mitchell has high-grade right ICA stenosis.  She is  neurologically asymptomatic.  She is most likely low risk to undergo  elective right carotid  endarterectomy for prevention of stroke.  She  will be hydrated overnight, discharged home in the morning and will  follow up with Dr. Mathis Bud.  She should be on aspirin and Plavix as  well.  She left the lab in stable condition.      Quay Burow, M.D.  Electronically Signed     JB/MEDQ  D:  05/08/2007  T:  05/09/2007  Job:  [id number]   cc:   2nd Floor [facility] Cardiac Catheterization Lab  [facility]  Leslye Peer, MD  Donato Heinz, M.D.  Aquilla Solian, M.D.

## 2010-12-13 NOTE — Procedures (Signed)
CAROTID DUPLEX EXAM   INDICATION:  Followup of known carotid artery disease.  Patient  describes a shading or blurriness of both left and right eyes,  alternating with each episode, lasting less than <1 minute for the past  few months.  Patient complains of right arm weakness and episodic  tingling of her right fingers.   HISTORY:  Diabetes:  No.  Cardiac:  No.  Hypertension:  No, taken off medication.  Smoking:  No.  Previous Surgery:  Right CEA with DPA on 05/10/07 by Dr. [name].  CV History:  Amaurosis Fugax ?, Paresthesias Yes, Hemiparesis Yes                                       RIGHT             LEFT  Brachial systolic pressure:         124               130  Brachial Doppler waveforms:         WNL               WNL  Vertebral direction of flow:        Antegrade         Antegrade  DUPLEX VELOCITIES (cm/sec)  CCA peak systolic                   56                94  ECA peak systolic                   174               [id number]  ICA peak systolic                   Occluded          [id number]  ICA end diastolic                   Occluded          35  PLAQUE MORPHOLOGY:                  homogenous        Mixed  PLAQUE AMOUNT:                      Large             Mild-moderate  PLAQUE LOCATION:                    ICA               Bifurcation, ICA   IMPRESSION:  1. Right internal carotid artery appears occluded, status post carotid      endarterectomy.  2. Left 40-59% internal carotid artery stenosis (low end of range).  3. Patent external carotid arteries.  4. Bilateral antegrade flow in vertebral arteries.   ___________________________________________  [name] [name], M.D.   PB/MEDQ  D:  11/19/2007  T:  11/19/2007  Job:  [id number]

## 2010-12-13 NOTE — Procedures (Signed)
NAMEDonah Mitchell               ACCOUNT NO.:  [id number]   MEDICAL RECORD NO.:  [id number]          PATIENT TYPE:  OUT   LOCATION:  RESP                          FACILITY:  [facility]   PHYSICIAN:  Edward L. Luan Pulling, M.D.DATE OF BIRTH:  1945-09-17   DATE OF PROCEDURE:  DATE OF DISCHARGE:                            PULMONARY FUNCTION TEST   1. Spirometry shows no definite ventilatory defect but does show      evidence of airflow obstruction at the level of the smaller airway.  2. Lung volumes are normal.  3. DLCO is moderately reduced.  4. Arterial blood gases are normal.  5. This is consistent with COPD.      Edward L. Luan Pulling, M.D.  Electronically Signed     ELH/MEDQ  D:  06/06/2008  T:  06/06/2008  Job:  [id number]

## 2010-12-13 NOTE — Assessment & Plan Note (Signed)
OFFICE VISIT   Emily Mitchell  DOB:  05-30-1946                                       02/18/2008  [id number]   The patient is status post right carotid endarterectomy in October 2008.  She had no perioperative symptoms but returned in 4 months for her  initial postop duplex scan, which revealed total occlusion of her right  internal carotid artery.  She remained asymptomatic at that time.  Since  then, she has been on aspirin and has suffered some episodes of blurred  vision, seems to be worse in the left eye than the right eye.  On  occasion, it has been left eye only and on occasion has been both eyes.  She has had no hemiparesis, aphasia or other neurologic symptoms but  possibly has had some drooping of the left side of her mouth, although  that is unclear.  She is on CPAP at night, managed by Dr. Velvet Bathe.   PHYSICAL EXAM:  But blood pressure 120/74, heart rate 70, respirations  14.  Right neck incision remains well healed.  Carotid pulses are 3+  with no bruits audible on the right and soft bruit on the left.  Neurologic exam is unremarkable today.  Chest:  Clear to auscultation.  Abdomen:  Soft, nontender with no palpable masses.  She has 3+ femoral  pulses bilaterally.   Carotid duplex exam reveals an ICA occlusion on the right and mild to  moderate left internal carotid stenosis in the 40-50% range.   We will continue to follow this for now and hopefully she will not have  any further neurologic symptoms, but if she does, she will be careful to  notice specifically what this is involving and whether it involves 1 eye  or both so that we can better localize this.  Otherwise, she will return  in 6 months with followup carotid duplex exam at that time unless she  develops symptoms in the interim.   [name] [name], M.D.  Electronically Signed   JDL/MEDQ  D:  02/18/2008  T:  02/19/2008  Job:  [id number]   cc:   Leslye Peer, MD  Jasper Loser.  Luan Pulling, M.D.

## 2010-12-13 NOTE — Consult Note (Signed)
NAMEDonah Mitchell               ACCOUNT NO.:  [id number]   MEDICAL RECORD NO.:  [id number]          PATIENT TYPE:  AMB   LOCATION:  DAY                           FACILITY:  [facility]   PHYSICIAN:  Caro Hight, M.D.      DATE OF BIRTH:  1946/04/15   DATE OF CONSULTATION:  DATE OF DISCHARGE:                                 CONSULTATION   REQUESTING PHYSICIAN:  Halford Chessman, MD   CHIEF COMPLAINT:  Needs colonoscopy and refractory GERD.   HISTORY OF PRESENT ILLNESS:  Ms. Emily Mitchell is a 65 year old Caucasian  female.  She tells me she was evaluated at [facility],  August 23, 2008, for chest pain.  She felt she was having a heart  attack.  She tells me, finally she thought that it may have just been a  gas bubble.  She has a history of chronic anemia.  She has been on  Aranesp for about 2 years.  She has been on omeprazole 20 mg daily for a  year.  She has chronic GERD.  Prior to this, she was on Nexium 40 mg  daily for several years.  About once or twice per month, she is having  breakthrough symptoms of heartburn and indigestion.  Otherwise, she does  well on her medicines.  She tells me about 25 years ago she had an EGD  and colonoscopy, both of which were normal.  She continues to have  bilateral lower quadrant intermittent pain, she rates it 4/10 on pain  scale usually or it can last all day.  Bowel movement seems to make her  pain better.  She is having some straining, some hard bowel movement.  She can go up to 2-3 days without a bowel movement.  She denies any  rectal bleeding or melena.  She denies any dysphagia, odynophagia.  Denies any anorexia.  She has tried stool softeners for constipation.  This did not seem to help.  She has used an enema, which she did get  minimal relief and she has not tried any laxatives.  Her hemoglobin on  August 23, 2008, was 11.2, hematocrit 34.1, MCD 88.8, platelet count  205, white blood cell count 8.7.  She had negative cardiac  markers.  She  had a d-dimer of 0.8.  She had acute abdominal films.  She had a  creatinine of 1.77.  She had BUN 25.  She had otherwise normal CMP  including LFTs.  Fecal occult blood test was negative.  She had a lipase  of 34.  She had a normal TSH.  She had iron of 72, TIBC 34.2%,  saturation 21, URBC 270.  She had a portable chest, which was normal.  She had a two-view abdomen, which showed moderate amount of stool  throughout the colon without evidence for obstruction or free air and a  granuloma at the left lung base.   PAST MEDICAL AND SURGICAL HISTORY:  Chronic anemia, chronic kidney  disease, and Bright disease, sleep apnea, chronic GERD,  hypercholesterolemia, hypothyroidism, anxiety, depression, macular  degeneration, partial hysterectomy, total knee arthroplasty  in 2005, and  a repair in November 2009 by Dr. Maia Breslow, right carotid  endarterectomy in 2008, recently diagnosed with diabetes mellitus,  chronic constipation.   CURRENT MEDICATIONS:  1. Trilipix 135 mg daily.  2. Citalopram 40 mg daily.  3. Singulair 10 mg daily.  4. Zetia 10 mg daily.  5. Clonazepam 1 mg daily.  6. Synthroid 0.125 mg daily.  7. Aspirin 81 mg daily.  8. Eye vitamins once daily.  9. Multivitamin once daily.  10.Alendronate 70 mg daily.  11.Omeprazole 20 mg daily.  12.Advair 250/50 mg daily.  13.Spiriva once daily.  14.Nitroglycerin daily.  15.Aranesp injections every two 9 months.  16.O2 two liters nightly.  17.CPAP at night.  18.Albuterol p.r.n.   ALLERGIES:  PENICILLIN, SULFA.   FAMILY HISTORY:  There is family history of one sister with ulcerative  colitis, one with crippling arthritis, and father deceased of unknown  etiology.  Mother age 55 with history of asthma and COPD.   SOCIAL HISTORY:  Ms. Emily Mitchell is married.  She has two grown healthy  children.  She is disabled.  Previously, worked at [employer].  She quit  smoking in 1996 with a  20-pack-year history.  She denied any  alcohol or  drug use.   REVIEW OF SYSTEMS:  See HPI, otherwise negative.   PHYSICAL EXAMINATION:  VITAL SIGNS:  Weight 212.5 pounds, height 63  inches, temperature 97.6, blood pressure 120/80 and pulse 72.  GENERAL:  She is an obese Caucasian female who is alert, pleasant,  cooperative in no acute distress.  HEENT:  Sclerae clear, nonicteric.  Conjunctivae pink.  Oropharynx pink  and moist without any lesions.  NECK:  Supple without mass or thyromegaly.  LUNGS:  Clear to auscultation except a mild expiratory wheeze, no acute  distress.  Chronic emphysematous changes.  ABDOMEN:  Protuberant with positive bowel sounds x4.  No bruits  auscultated.  Soft, nontender without palpable mass or  hepatosplenomegaly.  No rashes or guarding.  EXTREMITIES:  Without edema.   IMPRESSION:  Ms. Emily Mitchell is a 65 year old Caucasian female with refractory  gastroesophageal reflux disease despite PPI and she has had a recent  episode of atypical chest pain, she presented to the emergency  department.  She also has chronic constipation with recent abdominal  films suggestive of significant stool load.  She has anemia of chronic  disease and history of chronic kidney disease and Bright disease.   PLAN:  1. Constipation.  Literature given for her review.  2. GlycoLax 17 g daily as needed for constipation, 527 g with 5      refills.  3. Colonoscopy and esophagogastroduodenoscopy, with Dr. Stann Mainland in the      near future was discussed, both      procedures including risks and benefits, which include, but not      limited to bleeding, infection, perforation, or drug reaction.  She      agrees with plan, consent will be obtained.  She is most likely      going to be sedated in the care of anesthesia in the OR given her      history of sleep apnea, oxygen requirements, and anxiolytics.      Vickey Huger, N.P.      Caro Hight, M.D.  Electronically Signed    KJ/MEDQ  D:  09/18/2008  T:  09/19/2008   Job:  [id number]   cc:   Halford Chessman, M.D.  Fax: [phone]

## 2010-12-13 NOTE — Discharge Summary (Signed)
NAMERochele Mitchell NO.:  [id number]   MEDICAL RECORD NO.:  [id number]          PATIENT TYPE:  OBV   LOCATION:  2031                         FACILITY:  [facility]   PHYSICIAN:  Doroteo Bradford. Johnnye Sima, M.D.DATE OF BIRTH:  1946-07-31   DATE OF ADMISSION:  08/23/2008  DATE OF DISCHARGE:  08/24/2008                               DISCHARGE SUMMARY   DISCHARGE DIAGNOSES:  1. Atypical Chest pain (indigestion or anxiety).  2. Chronic obstructive pulmonary disease.  3. Right carotid occlusion and left carotid stenosis, status post      right carotid endarterectomy in October 2008.  4. Hypothyroidism.  5. Hyperlipidemia.  6. Obstructive sleep apnea.  7. Panic disorder.  8. Hypertension.  9. Depression.  10.Gastroesophageal reflux disease.  11.Osteoporosis.  12.Chronic kidney disease.  13.Coronary artery disease.  14.Status post right total knee arthroplasty.   DISCHARGE MEDICATIONS:  1. Senokot-S 1 tablet p.o. daily p.r.n. for constipation.  2. Trilipix 135 mg p.o. daily.  3. Citalopram 40 mg p.o. daily.  4. Synthroid 125 mcg p.o. daily.  5. Nexium 20 mg p.o. daily.  6. Spiriva 18 mcg 1 puff daily.  7. Fosamax 70 mg once a week.  8. Advair 250/50 as directed.  9. Albuterol 2 puffs q.4 h. p.r.n. for shortness of breath.  10.Multivitamin 1 tablet daily.   DISPOSITION AND FOLLOWUP:  Emily Mitchell was discharged from the hospital on  August 24, 2008, in stable and improved condition and her chest pain  has resolved on discharge.  She will have appointment with Dr. Joeseph Amor in 2-3 weeks and at that time need to check her blood pressure  and recheck her FOBT and may be referred to GI for screening colonoscopy  if constipation does not resolve and FOBT positive.   CONSULTATIONS:  None.   PROCEDURE PERFORMED:  1. Leg Doppler on August 24, 2008, did not show venous thrombosis in      legs.  2. Chest x-ray on August 23, 2008, no acute disease.  3. Acute abdominal  series on August 23, 2008 shows constipation      without bowel obstruction.   ADMISSION HISTORY:  Emily Mitchell is a 65 year old female with past medical  history of COPD, nonischemic coronary artery disease status post 2  cardiac catheterizations in October 2008 and January 2006, and GERD  disease coming to the [facility] ED, presenting with constant chest  pain.  The chest pain started at 6 o'clock in the morning of admission  date and woke her up, it was sharp, stabbing and pressuring, 10/10 in  severity, radiating to the back.  She took nitroglycerin sublingually,  which has subsided and the pain was better, she also had mild shortness  of breath with nausea, but no vomiting, fever, or headache.  Because the  pain persisted, so she came to the ED for further evaluation.  She  denied use of alcohol or drugs.  No diarrhea or weight change recently.   ADMISSION PHYSICAL EXAMINATION:  VITAL SIGNS:  Temperature 98.3, blood  pressure 121/58, pulse 76, respirations 19, and O2 sat 100 on  2 L.  GENERAL:  The patient is in no acute distress.  EYES:  Pupils are equal, round, and reactive to light.  Extraocular  movements are intact.  NECK:  Supple, no thyroid enlargement or JVD.  LUNGS:  Clear to auscultation bilaterally.  She has had some wheezing,  but no crackles.  HEART:  Regular rate and rhythm.  No murmur.  ABDOMEN:  Soft.  Epigastric slight tenderness.  Bowel sounds are  positive.  EXTREMITIES:  No edema.  NEUROLOGIC:  No focal findings.  PSYCHIATRIC:  Appropriate.   ADMISSION LABORATORY DATA:  CBC, white blood cell 8.7, hemoglobin 11.2,  and platelet 205.  Sodium 141, potassium 4.2, chloride 104, bicarb 29,  BUN 25, creatinine 1.77, glucose 90.  D-dimer 0.8.  FOBT negative,  lipase 34.   HOSPITAL COURSE BY PROBLEMS:  1. Chest pain.  The patient has history of atypical chest pain and had      cardiac catheterization in 2008 and 2006, which shows non-      obstructive coronary  artery disease and Myoview stress test is also      negative at that time. We admitted the patient to the hospital to      rule out acute coronary syndrome. During the hospiyalization we      found that the cyclic cardiac enzymes and EKGs as well as telemetry      monitoring were all unremarkable, so acute coronary syndrome is      unlikely.  The possible causes for her chest pain include      indigestion because abdominal x-ray shows constipation and the      patient has epigastric tenderness; GERD disease or gastritis,      anxiety is also possible.The patient's pain radiated to the back,      we checked the lipase, which was normal, so the pancreatitis is      unlikely.  On discharge, her chest pain has resolved by itself.      After discharge she need to continue to take Senokot for her      constipation.  If the constipation does not resolve and FOBT      positive, she may have GI referral for screening colonoscopy.  2. GERD disease or gastritis.  The patient has tenderness and      radiating to the back.  So we thought the patient may have GERD      disease or gastritis, and we have treated the patient with PPI. PUD      is less likely as her FOBT is negative. After the discharge, the      patient will continue to take nexium and may check the stool      Helicobacter antigen to exclude PUD disease at outpatient.  3. COPD.  The patient has some wheezing, so we treated the patient      with bronchodilators, she felt much better on discharge.  4. Chronic kidney disease.  Creatinine is 1.77, is stable and will      continue the followup with her nephrologist.  5. Hypothyroidism.  On admission, her TSH is 1.075 and she will      continue to take Synthroid at 125 mcg daily as before.   DISCHARGE VITALS:  Temperature 97.9, blood pressure 129/89, pulse 65,  respirations 19, oxygen saturation 100 on room air.   DISCHARGE LABORATORY DATA:  Sodium 142, potassium 4.3, chloride 107,  bicarb  29, BUN 23, creatinine 1.95, glucose 89.  White blood  cell 8.6,  hemoglobin 11.3, and platelets 212.      Geanie Kenning, MD  Electronically Signed      Doroteo Bradford. Johnnye Sima, M.D.  Electronically Signed    ZY/MEDQ  D:  09/02/2008  T:  09/03/2008  Job:  [id number]

## 2010-12-13 NOTE — Op Note (Signed)
NAMEDonah Mitchell               ACCOUNT NO.:  [id number]   MEDICAL RECORD NO.:  [id number]          PATIENT TYPE:  INP   LOCATION:  3312                         FACILITY:  [facility]   PHYSICIAN:  [name]. [name], M.D.  DATE OF BIRTH:  [birth year]   DATE OF PROCEDURE:  05/10/2007  DATE OF DISCHARGE:                               OPERATIVE REPORT   PREOPERATIVE DIAGNOSIS:  Severe right internal carotid stenosis,  asymptomatic.   POSTOPERATIVE DIAGNOSIS:  Severe right internal carotid stenosis,  asymptomatic.   OPERATION/PROCEDURE:  Right carotid endarterectomy with Dacron patch  angioplasty.   SURGEON:  [name]. [name], M.D.   FIRST ASSISTANT:  Nurse.   ANESTHESIA:  General endotracheal.   BRIEF HISTORY:  This patient was being evaluated for coronary artery  disease with cardiac catheterization performed during this  hospitalization which revealed no severe coronary disease and good  ejection fraction.  She had bilateral carotid bruits on duplex scan and  cerebral angiograms confirmed a 90% right internal carotid stenosis with  mild disease on the left side.  She is scheduled for right carotid  endarterectomy for an asymptomatic lesion.   PROCEDURE:  The patient was taken to the operating room, placed in a  supine position at which time satisfactory general endotracheal  anesthesia was administered.  The right neck was prepped with Betadine  scrub solution and draped in routine sterile manner.  Incision was made  along the anterior border of the sternocleidomastoid muscle and carried  down through subcutaneous tissue and platysma using Bovie.  The common  facial vein and external jugular veins were ligated with 3-0 silk ties,  divided exposing the common internal and external carotid arteries.  Care was taken not to injure the vagus or hypoglossal nerves both of  which were exposed.  There was calcified atherosclerotic plaque at the  carotid bifurcation, extending up the  internal carotid artery about 3  cm.  Distal vessel appeared normal. A  #10 shunt was prepared and the  patient was heparinized.  The carotid vessels were occluded with  vascular clamps.  Longitudinal opening made in the common carotid with  15-blade, extended up the internal carotid with Potts scissors to a  point distal to the disease.  Plaque was about 90% stenotic in severity.  Distal vessel appeared normal.  A #10 shunt was inserted without  difficulty reestablishing flow in about 2 minutes.  A standard  endarterectomy was then performed using the elevator and Potts scissors  with eversion endarterectomy of the external carotid.  The plaque  feathered off distal internal carotid artery nicely, not requiring any  tacking sutures.  The lumen was thoroughly irrigated with heparin  saline.  All loose debris carefully removed.  Arteriotomy was closed  with a patch using continuous 6-0 Prolene.  Prior to completion of the  closure,  the shunt was removed after 30 minutes shunt time.  Following  antegrade and retrograde flushing, closure was completed reestablishing  flow initially up the external then up the internal branch.  Carotid was  occluded for less than 2 minutes for removal of  shunt.  Protamine was then given to reverse the heparin.  Following  adequate hemostasis, the wound was irrigated with saline, closed in  layers with Vicryl in subcuticular fashion.  Sterile dressing applied.  The patient was taken to the recovery room in satisfactory condition.  [name]. [name], M.D.  Electronically Signed     JDL/MEDQ  D:  05/10/2007  T:  05/11/2007  Job:  [id number]   cc:   Leslye Peer, MD

## 2010-12-13 NOTE — Procedures (Signed)
NAMEDonah Mitchell               ACCOUNT NO.:  [id number]   MEDICAL RECORD NO.:  [id number]          PATIENT TYPE:  OUT   LOCATION:  RESP                          FACILITY:  [facility]   PHYSICIAN:  Edward L. Luan Pulling, M.D.DATE OF BIRTH:  10-05-1945   DATE OF PROCEDURE:  DATE OF DISCHARGE:  06/05/2008                            PULMONARY FUNCTION TEST   1. Spirometry shows no ventilatory defect, but evidence of airflow      obstruction.  2. Lung volumes are normal.  3. DLCO is moderately reduced.  4. Arterial blood gases are normal.      Edward L. Luan Pulling, M.D.  Electronically Signed     ELH/MEDQ  D:  06/12/2008  T:  06/12/2008  Job:  [id number]

## 2010-12-16 NOTE — Procedures (Signed)
NAME:  Emily Mitchell, Emily Mitchell NO.:  [id number]   MEDICAL RECORD NO.:  [id number]          PATIENT TYPE:  OUT   LOCATION:  RESP                          FACILITY:  [facility]   PHYSICIAN:  Edward L. Luan Pulling, M.D.DATE OF BIRTH:  12/02/1945   DATE OF PROCEDURE:  DATE OF DISCHARGE:  04/18/2004                              PULMONARY FUNCTION TEST   FINDINGS:  1.  Spirometry shows a very mild ventilatory defect with suggestion of air      flow obstruction at the level of the smaller airways.  2.  Lung volumes show a moderate reduction of TLC, suggestive of restrictive      change.  3.  DLCO is severely reduced.  4.  Arterial blood gases are normal.   When compared to previous pulmonary function testing, the spirometry has  actually shown some improvement as far as compared to predicted values.  The  total lung capacity is almost identical to the test in 2003, but the DLCO  has decreased compared to the test in 2003.      ELH/MEDQ  D:  04/20/2004  T:  04/21/2004  Job:  [id number]   cc:   [facility]

## 2010-12-16 NOTE — Op Note (Signed)
NAME:  Emily Mitchell, Emily Mitchell                         ACCOUNT NO.:  [id number]   MEDICAL RECORD NO.:  [id number]                   PATIENT TYPE:  INP   LOCATION:  5033                                 FACILITY:  [facility]   PHYSICIAN:  Lauretta Grill, M.D.                 DATE OF BIRTH:  09-Nov-1945   DATE OF PROCEDURE:  12/24/2003  DATE OF DISCHARGE:                                 OPERATIVE REPORT   PREOPERATIVE DIAGNOSIS:  End stage degenerative joint disease, right knee.   POSTOPERATIVE DIAGNOSIS:  End stage degenerative joint disease, right knee.   OPERATION PERFORMED:  Right total knee arthroplasty using cemented Depuy  components, LCS type with rotating platform.   SURGEON:  Lynden Ang, M.D.   ASSISTANT:  Thomasenia Bottoms, P.A.-C.   ANESTHESIA:  General endotracheal.   CULTURES:  None.   DRAINS:  Two medium Hemovacs and Autovac.   ESTIMATED BLOOD LOSS:  Less than 100 mL.   BLOOD REPLACED:  Without.   TOURNIQUET TIME:  One hour and 30 minutes.   PATHOLOGIC FINDINGS AND HISTORY:  Emily Mitchell is a 65 year old somewhat  overweight female with end stage degenerative joint disease right knee.  We  have followed along.  She is to the point of pain with every step, night  pain, can not walk more than a city block.  X-ray showed near bone-on-bone.  At surgery tricompartmental disease was noted.  We fitted her to a standard  femur, a 15 mm rotating  platform, a 2.5 tibia and a 35 patellar button, all  cemented with Tobramycin in the cement.  Due to a penicillin allergy she was  given Cleocin 300 prior and 300 at tourniquet let down for prophylaxis.  The  patient had full extension with flexion to 100 degrees with stable stressing  maneuver to varus valgus.   DESCRIPTION OF PROCEDURE:  With adequate anesthesia obtained using  endotracheal technique, 300 mg of Cleocin given IV prophylaxis and another  300 at tourniquet letdown, the patient was placed in supine position, the  right lower  extremity was prepped from the toes to the tourniquet in  standard fashion.  After standard prepping and draping, Esmarch  exsanguination was used.  The tourniquet was let up to 350 mmHg.  Median  parapatellar skin incision was followed by a median parapatellar retinacular  incision.  The incision was deepened sharply with knife and hemostasis  obtained using the Bovie electrocoagulator.  We then everted the patella and  excised the fat pad, the cruciates and both menisci.  I then sawed off the  tibial spine, placed the tibial cutting jig in place, set it, made the first  cut and made a second cut thereafter.  I then sized to a standard femur,  placed anterior posterior cutting block in place, made those cuts.  I then  fit between a 12.5 and a 15 in flexion, later decided a  15.  I then placed a  5 degree valgus cutting jig in place, made the first cut and sized to again  12.5 a little loose, 15 better and as time went on, the ligament stretched  out a bit, so ended up with a 15.  I then placed anterior and posterior  notch cutting jig in place and made those cuts.  I then exposed the proximal  tibia, sized to a 2.5, placed the central peg broach in place and made that  cut as well as the broach for the keel.  I then placed the trial tibial tray  in place with a 15 and a standard femur, articulated the knee through a full  range of motion.  I then callipered the patella measuring a 21, cutting it  down to a 12 to 13, part free hand, part with the cutting guide.  I then  placed a three-hole guide for the three peg patella 35 mm.  I made those  holes then trialed the patella.  I did a lateral release to improve tilt and  track.  I then removed all trial components and thoroughly jet lavaged the  knee.  We checked the components for sizing as they came on the field.  I  first thought I would use a 12.5 but later went to a 15 so I changed that  component ultimately but it was a between size.  I  then exposed the proximal  tibia, cemented on the tibial component, impacted it, removed excess cement.  I then put first a 12.5 rotating platform, later the 15, cemented on the  femoral component, impacted it, removed excess cement, then held the knee in  full extension, removed excess cement, then held it in 45 degrees of flexion  while the cement cured.  I cemented on the patellar component, impacted it  and removed excess cement and held it with a clamp. When the cement had set,  the tourniquet was let down.  Additional jet lavage was carried out.  Jet  lavage had been done prior to implantation as well as after.  The tourniquet  was let down.  Bleeding points were cauterized.  When the cement had cured,  Hemovac drains were placed in the medial and lateral gutter and brought out  the superolateral portal.  The wound was then closed in layers with #1  Vicryl on the retinaculum, 0 and 2-0 Vicryl in the subcutaneous and skin  staples.  Bulky sterile compressive dressing was applied with knee brace.  The patient having tolerated the procedure well was awakened and taken to  recovery in satisfactory condition to be admitted to the floor for routine  postoperative care and CPM.                                               Lauretta Grill, M.D.    VEP/MEDQ  D:  12/24/2003  T:  12/25/2003  Job:  [id number]

## 2010-12-16 NOTE — Procedures (Signed)
   NAME:  Emily Mitchell                           ACCOUNT NO.:   MEDICAL RECORD NO.:                             PATIENT TYPE:   LOCATION:                                       FACILITY:   PHYSICIAN:  Edward L. Luan Pulling, M.D.             DATE OF BIRTH:   DATE OF PROCEDURE:  DATE OF DISCHARGE:                              PULMONARY FUNCTION TEST   IMPRESSION:  1. Spirometry shows mild ventilatory defect with air flow obstruction of the     small airways.  2. Lung volumes show decreased PLC, which is of moderate degree.  3. DLCO is decreased about the same amount of the lung volumes.  4. Arterial blood gases are normal.  5. There is no significant bronchodilator response.                                               Edward L. Luan Pulling, M.D.    ELH/MEDQ  D:  04/23/2002  T:  04/24/2002  Job:  [id number]

## 2010-12-16 NOTE — Discharge Summary (Signed)
NAME:  Emily Mitchell, Emily Mitchell                         ACCOUNT NO.:  [id number]   MEDICAL RECORD NO.:  [id number]                   PATIENT TYPE:  INP   LOCATION:  5033                                 FACILITY:  [facility]   PHYSICIAN:  Lauretta Grill, M.D.                 DATE OF BIRTH:  09-27-1945   DATE OF ADMISSION:  12/24/2003  DATE OF DISCHARGE:  12/28/2003                                 DISCHARGE SUMMARY   ADMISSION DIAGNOSIS:  End-stage degenerative joint disease, right knee.   DISCHARGE DIAGNOSIS:  End-stage degenerative joint disease, right knee,  status post right total knee arthroplasty.   SUMMARY:  Patient was brought to [facility] on 12/24/2003 for a  right total knee arthroplasty.  A DePuy LCS rotating stem total knee was  used.  This was done under general anesthesia with a femoral nerve block.  Estimated blood loss was less than 100 cc.  The wound was closed over two  medium drains attached to an Autovac.  Tourniquet time was 1 hour 30  minutes, and she was taken to PACU in stable condition.  On post-op day 1,  12/25/2003, the pain was well controlled.  She had had some difficulty  sleeping, but her appetite was improved in the morning.  She had a little  bit of nausea the night before but none in the morning.  No problems with  shortness of breath.  She had been on CPM.  Her vitals were stable.  Maximum  temperature 100.1.  Right knee dressing was clean and dry.  Calf soft and  nontender.  She was neurovascularly intact.  Hemoglobin 12.0, hematocrit  34.9, white count 13.9, platelet count 224.  PT was 13.7 with INR of 1.1.  Serum chemistries normal except for an elevated glucose of 150.  She was  written for out of bed to chair.  Weightbearing as tolerated with a walker  and knee immobilizer, encouraging ambulation over the weekend.  Her Lovenox  and Coumadin were per pharmacy protocol.  Coumadin will be for 4 weeks post-  op.  We had her on a PCA; continue with that  and then planning to decrease  the IV to a saline while discontinuing PCA and go with Dilaudid for  breakthrough over the weekend.  OxyContin and Percocet as needed for pain  control.  Her PT/OT and rehab consults were obtained.  On Saturday,  12/26/2003, post-op day 2, she was doing okay, taking p.o. and voiding  without difficulty.  Had been out of bed to chair.  Vitals were stable.  She  was afebrile.  O2 sat was 93% on room air.  Hemoglobin 11.1, hematocrit  31.6.  PT was 14.1 with an INR of 1.1.  The wound was benign.  The calf was  soft.  Lungs were clear with slight decreased breath sounds.  Her use of the  incentive spirometer was encouraged.  Lovenox and Coumadin for pharmacy  protocol.  Out of bed to chair.  Increase ambulation as tolerated.  Drain  was discontinued.  Dressing was changed.  PCA was discontinued.  Her IV was  converted to a saline well.  On 12/27/2003, post-op day 3, she was  essentially without complaints.  Vitals were stable.  She was afebrile.  Lungs were clear.  Serum chemistries were within normal limits.  Hemoglobin  10.1, hematocrit 29.1.  PT 15.0 with an INR of 1.3.  Rehab consult was still  pending but was anticipating discharge home.  On 12/28/2003, post-op day 4,  she was without complaints, taking p.o. and voiding without difficulty,  doing well with therapy.  Vitals were stable.  Maximum temperature 99.2.  The knee wound was benign.  Calf was soft.  Neurovascularly intact.  PT was  17.9 with an INR of 1.8.  Plan was made to discharge her home.  Her IV was  discontinued.  Her diet was regular as tolerated.  Activity was  weightbearing as tolerated using a walker or immobilizer as needed until  cleared by therapy.  Wound care was keep clean and dry.  Dressing changes as  needed.   MEDICATIONS ON DISCHARGE:  1. Percocet 5/325, 1-2 q.4-6h. as needed, 50 with no refills.  2. Robaxin 750 mg 1 q.8h. as needed for spasm.  3. Coumadin as directed by home  health nurse/pharmacy.   FOLLOWUP:  With Dr. Eddie Dibbles on 01/08/2004; call for an appointment.   SPECIAL INSTRUCTIONS:  Call for increasing pain, redness, drainage,  numbness, tingling, cough, shortness of breath, fever greater than 100.5 or  chills.   CONDITION UPON DISCHARGE:  Improved.      Thomasenia Bottoms, P.A.-C.                       Lauretta Grill, M.D.    AC/MEDQ  D:  01/22/2004  T:  01/24/2004  Job:  [id number]

## 2010-12-16 NOTE — Cardiovascular Report (Signed)
NAME:  Emily Mitchell, Emily Mitchell NO.:  [id number]   MEDICAL RECORD NO.:  [id number]          PATIENT TYPE:  OIB   LOCATION:  6501                         FACILITY:  [facility]   PHYSICIAN:  Junious Silk, M.D. LHCDATE OF BIRTH:  May 20, 1946   DATE OF PROCEDURE:  08/16/2004  DATE OF DISCHARGE:                              CARDIAC CATHETERIZATION   PROCEDURE PERFORMED:  Right and left heart catheterization with coronary  angiography and left ventriculography.   INDICATIONS:  [name] is a 65 year old woman with history of COPD. She has  had progressive exertional dyspnea as well as episodes of chest pain. Her  dyspnea has progressed to where she becomes short of breath walking from  room to room in her house. She was evaluated by Dr. Harrington Challenger and referred for  right and left heart catheterization to assess her hemodynamics, left  ventricular function and coronary anatomy.   PROCEDURE NOTE:  A 7-French sheath was placed in the right femoral vein and  4-French sheath was placed in the right femoral artery. Right heart  catheterization was performed with a Swan-Ganz catheter. Left  ventriculography was performed with an angled pigtail catheter. Coronary  arteriography was performed with 4-French JL-4 and no torque right  catheters. Contrast was Omnipaque. There were no complications.   RESULTS:  HEMODYNAMICS  Right atrial mean pressure 3, right ventricular pressure 27/5. Pulmonary  artery pressure 27/12 and pulmonary capillary wedge mean pressure 6. Left  ventricular pressure 140/12, aortic pressure 136/62. There is no gradient  measured across the mitral valve as determined by simultaneous measurement  of the pulmonary capillary wedge pressure in the left ventricular end  diastolic pressure. There is also no gradient measured across the aortic  valve on catheter pullback.   Cardiac output by thermal dilution method is 4.1. Cardiac index 2.05.  Cardiac output by the Fick method  is 4.1. Cardiac index 2.05.   Left ventriculogram:  There is mild global hypokinesis of the left  ventricle. Ejection fraction is estimated 50-55%. There is 1+ mitral  regurgitation which may in part be due to catheter position.   Coronary arteriography.  Left main is normal.  Left anterior descending artery has a 40% stenosis in the mid vessel. The  LAD gives rise to normal sized first diagonal branch is small second  diagonal branch.  Left circumflex gives rise to a small first obtuse marginal branch and a  normal size second obtuse marginal branch. The left circumflex is normal.  Right coronary artery has minor luminal irregularities in the proximal and  mid vessel. The vessels otherwise normal, giving rise to a normal size  posterior descending artery normal size first posterolateral branch. A small  second posterolateral branch.   IMPRESSION:  1.  Normal right and left heart filling pressures with normal pulmonary      artery pressures.  2.  Low normal left ventricular systolic function.  3.  Mild but nonobstructive coronary artery disease.   RECOMMENDATIONS:  Medical therapy. Suspect the patient's shortness of breath  is secondary to her pulmonary disease.      Elta Guadeloupe  MWP/MEDQ  D:  08/16/2004  T:  08/16/2004  Job:  [id number]   cc:   Bonne Dolores, M.D.  [address], [place] [address]  Fax: [phone]   Fay Records, M.D.

## 2011-04-20 LAB — CBC
Hemoglobin: 10.9 — ABNORMAL LOW
MCHC: 33.3
MCV: 83.8
RBC: 3.9

## 2011-04-21 LAB — CBC
MCHC: 34.5
MCV: 85.1
Platelets: 215

## 2011-04-25 LAB — IRON AND TIBC: Iron: 65

## 2011-04-25 LAB — HEMOGLOBIN AND HEMATOCRIT, BLOOD: Hemoglobin: 11.9 — ABNORMAL LOW

## 2011-04-26 LAB — HEMOGLOBIN AND HEMATOCRIT, BLOOD
HCT: 33.6 — ABNORMAL LOW
Hemoglobin: 11.7 — ABNORMAL LOW

## 2011-04-27 LAB — IRON AND TIBC
Iron: 81
Saturation Ratios: 29
UIBC: 202

## 2011-04-27 LAB — RENAL FUNCTION PANEL
Albumin: 4.1
BUN: 32 — ABNORMAL HIGH
Chloride: 103
Potassium: 4.1
Sodium: 139

## 2011-04-27 LAB — HEMOGLOBIN AND HEMATOCRIT, BLOOD: HCT: 35.5 — ABNORMAL LOW

## 2011-04-28 LAB — RENAL FUNCTION PANEL
BUN: 27 — ABNORMAL HIGH
CO2: 26
Chloride: 107
Glucose, Bld: 87
Potassium: 4.6

## 2011-04-28 LAB — HEMOGLOBIN AND HEMATOCRIT, BLOOD
HCT: 32.6 — ABNORMAL LOW
Hemoglobin: 10.9 — ABNORMAL LOW

## 2011-05-01 LAB — RENAL FUNCTION PANEL
BUN: 28 — ABNORMAL HIGH
CO2: 27
Chloride: 103
Creatinine, Ser: 1.89 — ABNORMAL HIGH
Potassium: 4.1

## 2011-05-01 LAB — IRON AND TIBC
Iron: 71
Saturation Ratios: 22
TIBC: 324
UIBC: 253

## 2011-05-01 LAB — HEMOGLOBIN AND HEMATOCRIT, BLOOD
HCT: 34.4 — ABNORMAL LOW
Hemoglobin: 11.5 — ABNORMAL LOW

## 2011-05-02 LAB — BASIC METABOLIC PANEL
BUN: 25 — ABNORMAL HIGH
CO2: 27
Calcium: 8.5
Calcium: 8.8
Creatinine, Ser: 1.52 — ABNORMAL HIGH
Creatinine, Ser: 1.66 — ABNORMAL HIGH
GFR calc Af Amer: 38 — ABNORMAL LOW
GFR calc Af Amer: 42 — ABNORMAL LOW
GFR calc non Af Amer: 35 — ABNORMAL LOW
Sodium: 141

## 2011-05-02 LAB — TYPE AND SCREEN
ABO/RH(D): AB NEG
Antibody Screen: NEGATIVE

## 2011-05-02 LAB — CBC
Hemoglobin: 8.5 — ABNORMAL LOW
Hemoglobin: 9.5 — ABNORMAL LOW
MCHC: 33.3
MCHC: 33.3
MCHC: 33.8
MCV: 89.5
MCV: 90.2
Platelets: 193
Platelets: 247
RBC: 2.87 — ABNORMAL LOW
RBC: 3.18 — ABNORMAL LOW
RBC: 3.19 — ABNORMAL LOW
RBC: 3.96
RDW: 13.7
WBC: 8.9
WBC: 9.3

## 2011-05-02 LAB — ANAEROBIC CULTURE

## 2011-05-02 LAB — BLOOD GAS, ARTERIAL
Bicarbonate: 23.6
TCO2: 21.3
pCO2 arterial: 36.9
pH, Arterial: 7.421 — ABNORMAL HIGH

## 2011-05-02 LAB — URINALYSIS, ROUTINE W REFLEX MICROSCOPIC
Glucose, UA: NEGATIVE
Ketones, ur: NEGATIVE
Nitrite: NEGATIVE
Protein, ur: NEGATIVE

## 2011-05-02 LAB — URINE CULTURE: Colony Count: 30000

## 2011-05-02 LAB — COMPREHENSIVE METABOLIC PANEL
ALT: 19
AST: 19
Albumin: 3.8
CO2: 29
Calcium: 9.5
Creatinine, Ser: 1.91 — ABNORMAL HIGH
GFR calc Af Amer: 32 — ABNORMAL LOW
GFR calc non Af Amer: 27 — ABNORMAL LOW
Sodium: 141
Total Protein: 6.9

## 2011-05-02 LAB — DIFFERENTIAL
Eosinophils Absolute: 0.1
Eosinophils Relative: 1
Lymphocytes Relative: 16
Lymphs Abs: 1.4
Monocytes Absolute: 0.5
Monocytes Relative: 6

## 2011-05-02 LAB — TISSUE CULTURE: Gram Stain: NONE SEEN

## 2011-05-02 LAB — ABO/RH: ABO/RH(D): AB NEG

## 2011-05-02 LAB — PROTIME-INR: Prothrombin Time: 12.8

## 2011-05-02 LAB — HEMOGLOBIN AND HEMATOCRIT, BLOOD: Hemoglobin: 11.3 — ABNORMAL LOW

## 2011-05-04 LAB — HEMOGLOBIN AND HEMATOCRIT, BLOOD
HCT: 32.2 % — ABNORMAL LOW (ref 36.0–46.0)
Hemoglobin: 10.8 g/dL — ABNORMAL LOW (ref 12.0–15.0)

## 2011-05-04 LAB — RENAL FUNCTION PANEL
Albumin: 3.9 g/dL (ref 3.5–5.2)
BUN: 28 mg/dL — ABNORMAL HIGH (ref 6–23)
Creatinine, Ser: 1.7 mg/dL — ABNORMAL HIGH (ref 0.4–1.2)
Phosphorus: 2.9 mg/dL (ref 2.3–4.6)

## 2011-05-04 LAB — IRON AND TIBC
Saturation Ratios: 14 % — ABNORMAL LOW (ref 20–55)
TIBC: 355 ug/dL (ref 250–470)

## 2011-05-05 LAB — CBC
Hemoglobin: 11.4 — ABNORMAL LOW
MCHC: 33.3
RDW: 14

## 2011-05-09 LAB — CBC
MCHC: 33.8
RBC: 3.96
WBC: 7.8

## 2011-05-10 LAB — CBC
MCV: 85
RBC: 3.67 — ABNORMAL LOW
WBC: 7.4

## 2011-05-11 LAB — BASIC METABOLIC PANEL
BUN: 10
CO2: 30
CO2: 35 — ABNORMAL HIGH
Calcium: 8.9
Calcium: 8.9
Calcium: 9
Calcium: 9.6
Chloride: 101
Chloride: 106
Chloride: 108
Creatinine, Ser: 1.26 — ABNORMAL HIGH
Creatinine, Ser: 1.43 — ABNORMAL HIGH
Creatinine, Ser: 1.54 — ABNORMAL HIGH
GFR calc Af Amer: 39 — ABNORMAL LOW
GFR calc Af Amer: 42 — ABNORMAL LOW
GFR calc Af Amer: 45 — ABNORMAL LOW
GFR calc Af Amer: 47 — ABNORMAL LOW
GFR calc Af Amer: 53 — ABNORMAL LOW
GFR calc non Af Amer: 33 — ABNORMAL LOW
GFR calc non Af Amer: 34 — ABNORMAL LOW
GFR calc non Af Amer: 38 — ABNORMAL LOW
GFR calc non Af Amer: 43 — ABNORMAL LOW
Potassium: 3.9
Potassium: 4.1
Sodium: 139
Sodium: 141
Sodium: 141

## 2011-05-11 LAB — COMPREHENSIVE METABOLIC PANEL
Albumin: 3.4 — ABNORMAL LOW
BUN: 34 — ABNORMAL HIGH
CO2: 23
Chloride: 108
Creatinine, Ser: 1.7 — ABNORMAL HIGH
GFR calc non Af Amer: 31 — ABNORMAL LOW
Glucose, Bld: 111 — ABNORMAL HIGH
Total Bilirubin: 0.7

## 2011-05-11 LAB — CBC
HCT: 32.1 — ABNORMAL LOW
HCT: 31.1 — ABNORMAL LOW
Hemoglobin: 10.2 — ABNORMAL LOW
Hemoglobin: 10.9 — ABNORMAL LOW
Hemoglobin: 10.7 — ABNORMAL LOW
MCHC: 33.8
MCHC: 34.1
MCV: 85.6
MCV: 86
MCV: 86.1
Platelets: 178
Platelets: 198
Platelets: 202
Platelets: 192
RBC: 3.18 — ABNORMAL LOW
RBC: 3.48 — ABNORMAL LOW
RBC: 3.61 — ABNORMAL LOW
RDW: 13.8
WBC: 6.2
WBC: 8
WBC: 9.8
WBC: 6.9

## 2011-05-11 LAB — TYPE AND SCREEN
ABO/RH(D): AB NEG
Antibody Screen: NEGATIVE

## 2011-05-11 LAB — ABO/RH: ABO/RH(D): AB NEG

## 2011-05-11 LAB — PROTIME-INR
INR: 0.9
Prothrombin Time: 12.4

## 2011-05-11 LAB — HEMOGLOBIN A1C
Hgb A1c MFr Bld: 5.3
Mean Plasma Glucose: 111

## 2011-05-11 LAB — APTT: aPTT: <20 — ABNORMAL LOW

## 2011-05-12 LAB — CBC
HCT: 30.4 — ABNORMAL LOW
Hemoglobin: 10.2 — ABNORMAL LOW
MCV: 87.7
Platelets: 191
RDW: 13.8

## 2011-05-15 LAB — CBC
HCT: 27.8 — ABNORMAL LOW
HCT: 28.4 — ABNORMAL LOW
Hemoglobin: 9.6 — ABNORMAL LOW
MCHC: 34.4
MCV: 88
MCV: 88.1
Platelets: 274
RBC: 3.16 — ABNORMAL LOW
RDW: 13.8

## 2011-05-15 LAB — BASIC METABOLIC PANEL
BUN: 29 — ABNORMAL HIGH
Creatinine, Ser: 1.58 — ABNORMAL HIGH
GFR calc non Af Amer: 34 — ABNORMAL LOW
Glucose, Bld: 97
Potassium: 4.2

## 2011-05-15 LAB — DIFFERENTIAL
Basophils Absolute: 0
Eosinophils Absolute: 0.1
Eosinophils Relative: 1
Neutrophils Relative %: 72

## 2011-05-16 LAB — TRANSFERRIN: Transferrin: 283

## 2011-05-16 LAB — CBC
HCT: 32.7 — ABNORMAL LOW
Hemoglobin: 11.1 — ABNORMAL LOW
Platelets: 277
RBC: 3.68 — ABNORMAL LOW
WBC: 10.9 — ABNORMAL HIGH

## 2011-05-18 LAB — CBC
HCT: 30.9 — ABNORMAL LOW
Hemoglobin: 11.1 — ABNORMAL LOW
Hemoglobin: 11.4 — ABNORMAL LOW
MCHC: 35.4
MCHC: 35.8
MCV: 86.2
RBC: 3.72 — ABNORMAL LOW
RDW: 14.9 — ABNORMAL HIGH

## 2011-05-18 LAB — BASIC METABOLIC PANEL
CO2: 25
Calcium: 8.9
GFR calc Af Amer: 32 — ABNORMAL LOW
Sodium: 138

## 2011-05-18 LAB — POCT CARDIAC MARKERS
CKMB, poc: <1 — ABNORMAL LOW
Troponin i, poc: <0.05

## 2011-05-18 LAB — DIFFERENTIAL
Lymphocytes Relative: 19
Monocytes Absolute: 0.4
Monocytes Relative: 4
Neutro Abs: 7.6

## 2011-05-18 LAB — IRON AND TIBC: Saturation Ratios: 21

## 2011-06-13 ENCOUNTER — Ambulatory Visit (HOSPITAL_COMMUNITY)
Admission: RE | Admit: 2011-06-13 | Discharge: 2011-06-13 | Disposition: A | Discharge status: Home / Self Care | Payer: Medicare Other | Source: Ambulatory Visit | Attending: Pulmonary Disease | Admitting: Pulmonary Disease

## 2011-06-13 DIAGNOSIS — J4489 Other specified chronic obstructive pulmonary disease: Secondary | ICD-10-CM | POA: Insufficient documentation

## 2011-06-13 DIAGNOSIS — J449 Chronic obstructive pulmonary disease, unspecified: Secondary | ICD-10-CM | POA: Insufficient documentation

## 2011-06-13 DIAGNOSIS — R0602 Shortness of breath: Secondary | ICD-10-CM | POA: Insufficient documentation

## 2011-06-13 MED ORDER — ALBUTEROL SULFATE (5 MG/ML) 0.5% IN NEBU
2.5000 mg | INHALATION_SOLUTION | Freq: Once | RESPIRATORY_TRACT | Status: AC
  Administered 2011-06-13: 2.5 mg via RESPIRATORY_TRACT

## 2011-06-14 NOTE — Procedures (Signed)
NAME:  Emily Mitchell, Emily Mitchell NO.:  [id number]  MEDICAL RECORD NO.:  [id number]  LOCATION:  RESP                          FACILITY:  [facility]  PHYSICIAN:  Tewana Bohlen L. Luan Pulling, M.D.DATE OF BIRTH:  25-Oct-1945  DATE OF PROCEDURE: DATE OF DISCHARGE:  06/13/2011                           PULMONARY FUNCTION TEST   Reason for pulmonary function testing is shortness of breath. 1. Spirometry does not show a definite ventilatory defect, but does     show airflow obstruction. 2. Lung volumes are normal. 3. DLCO is severely reduced, but does correct some for volumes. 4. There is no significant bronchodilator improvement. 5. This is consistent with COPD.     [name] L. Luan Pulling, M.D.     ELH/MEDQ  D:  06/14/2011  T:  06/14/2011  Job:  [id number]  cc:   Dr. Hilma Favors

## 2012-07-26 ENCOUNTER — Encounter: Payer: Self-pay | Admitting: Vascular Surgery

## 2012-12-02 ENCOUNTER — Encounter: Payer: Self-pay | Admitting: Vascular Surgery

## 2012-12-02 ENCOUNTER — Encounter: Payer: Self-pay | Admitting: *Deleted

## 2012-12-16 ENCOUNTER — Other Ambulatory Visit: Payer: Self-pay | Admitting: *Deleted

## 2012-12-16 ENCOUNTER — Encounter: Payer: Self-pay | Admitting: Vascular Surgery

## 2012-12-17 ENCOUNTER — Encounter: Payer: Self-pay | Admitting: Vascular Surgery

## 2012-12-17 ENCOUNTER — Other Ambulatory Visit: Payer: Self-pay | Admitting: *Deleted

## 2012-12-17 ENCOUNTER — Ambulatory Visit (INDEPENDENT_AMBULATORY_CARE_PROVIDER_SITE_OTHER): Payer: Medicare Other | Admitting: Vascular Surgery

## 2012-12-17 ENCOUNTER — Other Ambulatory Visit (INDEPENDENT_AMBULATORY_CARE_PROVIDER_SITE_OTHER): Payer: Medicare Other | Admitting: Vascular Surgery

## 2012-12-17 DIAGNOSIS — M25561 Pain in right knee: Secondary | ICD-10-CM

## 2012-12-17 DIAGNOSIS — R200 Anesthesia of skin: Secondary | ICD-10-CM | POA: Insufficient documentation

## 2012-12-17 DIAGNOSIS — I6529 Occlusion and stenosis of unspecified carotid artery: Secondary | ICD-10-CM

## 2012-12-17 DIAGNOSIS — Z48812 Encounter for surgical aftercare following surgery on the circulatory system: Secondary | ICD-10-CM

## 2012-12-17 DIAGNOSIS — M25562 Pain in left knee: Secondary | ICD-10-CM | POA: Insufficient documentation

## 2012-12-17 DIAGNOSIS — M79609 Pain in unspecified limb: Secondary | ICD-10-CM

## 2012-12-17 DIAGNOSIS — R209 Unspecified disturbances of skin sensation: Secondary | ICD-10-CM

## 2012-12-17 DIAGNOSIS — M7989 Other specified soft tissue disorders: Secondary | ICD-10-CM | POA: Insufficient documentation

## 2012-12-17 HISTORY — DX: Anesthesia of skin: R20.0

## 2012-12-17 HISTORY — DX: Occlusion and stenosis of unspecified carotid artery: I65.29

## 2012-12-17 HISTORY — DX: Pain in right knee: M25.561

## 2012-12-17 NOTE — Progress Notes (Deleted)
Subjective:     Patient ID: Emily Mitchell, female   DOB: 1946-06-13, 67 y.o.   MRN: [id number]  HPI this 67 year old female is seen in followup regarding her carotid occlusive disease she has had a right carotid endarterectomy performed by me in 2008 which subsequently occluded but remained asymptomatic. She has a known 50-60% left ICA stenosis which also is asymptomatic. She has been followed at times at [facility] heart and vascular but has requested that we follow her carotid disease since I did her previous surgery. She denies any current neurologic symptoms such as lateralizing weakness, aphasia, amaurosis fugax, diplopia, or blurred vision. She does occasionally have syncopal episodes.  VASCULAR SURGERY PROGRESS NOTE  SUBJECTIVE: ***  PHYSICAL EXAM: Filed Vitals:   12/17/12 1423 12/17/12 1429  BP: 127/73 104/57  Pulse: 78 74  Resp: 16   Height: 5\' 4"  (1.626 m)   Weight: 225 lb (102.059 kg)   SpO2: 96%    ***  LABS: Lab Results  Component Value Date   WBC 8.4 02/04/2010   HGB 13.5 02/04/2010   HCT 38.8 02/04/2010   MCV 89.1 02/04/2010   PLT 225 02/04/2010   Lab Results  Component Value Date   CREATININE 1.36* 02/04/2010   Lab Results  Component Value Date   INR 1.6* 04/02/2009   CBG (last 3)  No results found for this basename: GLUCAP,  in the last 72 hours      Review of Systems     Objective:   Physical Exam     Assessment:     ***    Plan:     ***

## 2012-12-17 NOTE — Progress Notes (Signed)
Subjective:     Patient ID: Emily Mitchell, female   DOB: 10-Aug-1945, 68 y.o.   MRN: [id number]  HPI this 67 year old female seen for followup regarding her carotid occlusive disease. She had a right carotid endarterectomy performed in 2008 by me. She had no neurologic symptoms. She subsequently developed occlusion of the right ICA and remained asymptomatic. She has had a 50-60% left ICA stenosis in the past and has been followed by [facility] heart and vascular but recently requested to come here at [facility]  to have her carotid disease followed. She denies any lateralizing weakness, a fascia, amaurosis fugax, diplopia, or blurred vision. She does occasionally have syncopal episodes. She has significant pulmonary issues the COPD.  Past Medical History  Diagnosis Date  . Carotid artery occlusion   . Hyperlipidemia   . Hypertension   . Hypothyroidism   . Depression with anxiety   . COPD (chronic obstructive pulmonary disease)     History  Substance Use Topics  . Smoking status: Former Smoker -- 30 years    Quit date: 12/03/1994  . Smokeless tobacco: Never Used  . Alcohol Use: No    Family History  Problem Relation Age of Onset  . Stroke Mother     mini stroke  . Diabetes Sister     Allergies  Allergen Reactions  . Niaspan (Niacin Er) Nausea And Vomiting and Swelling    Swelling in mouth  . Pantoprazole     Mouth sores  . Penicillins   . Sulfonamide Derivatives     Current outpatient prescriptions:acetaminophen (TYLENOL) 325 MG tablet, Take 650 mg by mouth every 6 (six) hours as needed for pain., Disp: , Rfl: ;  ADVAIR DISKUS 250-50 MCG/DOSE AEPB, , Disp: , Rfl: ;  aspirin 81 MG tablet, Take 81 mg by mouth daily., Disp: , Rfl: ;  B Complex Vitamins (B-COMPLEX/B-12 TR PO), Take by mouth., Disp: , Rfl:  calcium citrate-vitamin D (CITRACAL+D) 315-200 MG-UNIT per tablet, Take 1 tablet by mouth 2 (two) times daily., Disp: , Rfl: ;  Cholecalciferol (D3 MAXIMUM STRENGTH) 5000 UNITS  capsule, Take 5,000 Units by mouth daily., Disp: , Rfl: ;  Choline Fenofibrate (FENOFIBRIC ACID) 135 MG CPDR, , Disp: , Rfl: ;  citalopram (CELEXA) 40 MG tablet, , Disp: , Rfl: ;  clonazePAM (KLONOPIN) 1 MG tablet, , Disp: , Rfl:  levalbuterol (XOPENEX) 1.25 MG/0.5ML nebulizer solution, Take 1 ampule by nebulization every 4 (four) hours as needed for wheezing., Disp: , Rfl: ;  levothyroxine (SYNTHROID, LEVOTHROID) 137 MCG tablet, , Disp: , Rfl: ;  metoCLOPramide (REGLAN) 10 MG tablet, , Disp: , Rfl: ;  montelukast (SINGULAIR) 10 MG tablet, , Disp: , Rfl: ;  Multiple Vitamins-Minerals (CENTRUM SILVER PO), Take by mouth., Disp: , Rfl:  Multiple Vitamins-Minerals (PRESERVISION AREDS PO), Take 10 g by mouth every morning., Disp: , Rfl: ;  NEXIUM 40 MG capsule, , Disp: , Rfl: ;  telmisartan-hydrochlorothiazide (MICARDIS HCT) 80-12.5 MG per tablet, , Disp: , Rfl: ;  traMADol (ULTRAM) 50 MG tablet, Take 50 mg by mouth every 6 (six) hours as needed for pain., Disp: , Rfl: ;  ZETIA 10 MG tablet, , Disp: , Rfl:   BP 104/57  Pulse 74  Resp 16  Ht 5\' 4"  (1.626 m)  Wt 225 lb (102.059 kg)  BMI 38.6 kg/m2  SpO2 96%  Body mass index is 38.6 kg/(m^2).         Review of Systems complains of bilateral knee pain has had bilateral  knee replacements. Also has occasional chest pain and pressure, dyspnea on exertion, orthopnea, bilateral leg pain, bilateral leg swelling, asthma, COPD, numbness in feet and arms.    Objective:   Physical Exam blood pressure 104/57 heart rate 74 respirations 16 Gen.-alert and oriented x3 in no apparent distress-obese HEENT normal for age Lungs no rhonchi or wheezing Cardiovascular regular rhythm no murmurs carotid pulses 3+ palpable no bruits audible Abdomen soft nontender no palpable masses Musculoskeletal free of  major deformities Skin clear -no rashes Neurologic normal Lower extremities 3+ femoral and dorsalis pedis pulses palpable bilaterally with no edema.   Today I  ordered a carotid duplex exam which I reviewed and interpreted. The right ICA is chronically occluded. The left ICA has a stable 50% stenosis which has been present previously in our lab and 2012       Assessment:     Chronic right ICA occlusion with 50% left ICA stenosis-asymptomatic    Plan:     Return in one year for followup carotid duplex exam and less develops specific neurologic symptoms in the interim Continue daily aspirin

## 2013-03-17 ENCOUNTER — Emergency Department (HOSPITAL_BASED_OUTPATIENT_CLINIC_OR_DEPARTMENT_OTHER)
Admission: EM | Admit: 2013-03-17 | Discharge: 2013-03-17 | Disposition: A | Discharge status: Home / Self Care | Payer: Medicare Other | Attending: Emergency Medicine | Admitting: Emergency Medicine

## 2013-03-17 ENCOUNTER — Encounter (HOSPITAL_BASED_OUTPATIENT_CLINIC_OR_DEPARTMENT_OTHER): Payer: Self-pay | Admitting: *Deleted

## 2013-03-17 DIAGNOSIS — Z79899 Other long term (current) drug therapy: Secondary | ICD-10-CM | POA: Insufficient documentation

## 2013-03-17 DIAGNOSIS — Z7982 Long term (current) use of aspirin: Secondary | ICD-10-CM | POA: Insufficient documentation

## 2013-03-17 DIAGNOSIS — Z8679 Personal history of other diseases of the circulatory system: Secondary | ICD-10-CM | POA: Insufficient documentation

## 2013-03-17 DIAGNOSIS — Z880 Allergy status to penicillin: Secondary | ICD-10-CM | POA: Insufficient documentation

## 2013-03-17 DIAGNOSIS — R259 Unspecified abnormal involuntary movements: Secondary | ICD-10-CM | POA: Insufficient documentation

## 2013-03-17 DIAGNOSIS — J449 Chronic obstructive pulmonary disease, unspecified: Secondary | ICD-10-CM | POA: Insufficient documentation

## 2013-03-17 DIAGNOSIS — J4489 Other specified chronic obstructive pulmonary disease: Secondary | ICD-10-CM | POA: Insufficient documentation

## 2013-03-17 DIAGNOSIS — Z8639 Personal history of other endocrine, nutritional and metabolic disease: Secondary | ICD-10-CM | POA: Insufficient documentation

## 2013-03-17 DIAGNOSIS — T43201A Poisoning by unspecified antidepressants, accidental (unintentional), initial encounter: Secondary | ICD-10-CM | POA: Insufficient documentation

## 2013-03-17 DIAGNOSIS — E039 Hypothyroidism, unspecified: Secondary | ICD-10-CM | POA: Insufficient documentation

## 2013-03-17 DIAGNOSIS — F329 Major depressive disorder, single episode, unspecified: Secondary | ICD-10-CM

## 2013-03-17 DIAGNOSIS — R251 Tremor, unspecified: Secondary | ICD-10-CM

## 2013-03-17 DIAGNOSIS — T43294A Poisoning by other antidepressants, undetermined, initial encounter: Secondary | ICD-10-CM | POA: Insufficient documentation

## 2013-03-17 DIAGNOSIS — I10 Essential (primary) hypertension: Secondary | ICD-10-CM | POA: Insufficient documentation

## 2013-03-17 DIAGNOSIS — F32A Depression, unspecified: Secondary | ICD-10-CM

## 2013-03-17 DIAGNOSIS — Z87891 Personal history of nicotine dependence: Secondary | ICD-10-CM | POA: Insufficient documentation

## 2013-03-17 DIAGNOSIS — G479 Sleep disorder, unspecified: Secondary | ICD-10-CM | POA: Insufficient documentation

## 2013-03-17 DIAGNOSIS — Y939 Activity, unspecified: Secondary | ICD-10-CM | POA: Insufficient documentation

## 2013-03-17 DIAGNOSIS — F341 Dysthymic disorder: Secondary | ICD-10-CM | POA: Insufficient documentation

## 2013-03-17 DIAGNOSIS — Y9210 Unspecified residential institution as the place of occurrence of the external cause: Secondary | ICD-10-CM | POA: Insufficient documentation

## 2013-03-17 DIAGNOSIS — Z862 Personal history of diseases of the blood and blood-forming organs and certain disorders involving the immune mechanism: Secondary | ICD-10-CM | POA: Insufficient documentation

## 2013-03-17 MED ORDER — BUPROPION HCL ER (SR) 150 MG PO TB12: 150.0000 mg | ORAL_TABLET | Freq: Every day | ORAL | Status: DC

## 2013-03-17 MED ORDER — CLONAZEPAM 1 MG PO TABS: 1.0000 mg | ORAL_TABLET | Freq: Three times a day (TID) | ORAL | Status: DC | PRN
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
## 2013-03-17 NOTE — ED Notes (Signed)
Patient states she has not been sleeping for the last three days.  Patient states she is being treated by [facility] for depression.  Was at [facility] on February 03, 2013 for a Pulmonary Embolism.  While in the hospital, she was also treated for depression and changed from clonopin to Wellbutrin 100 mg.  States one weeks ago, her PCP changed her dosage from 100 mg once a day to 150 mg twice a day.  States she has had generalized shaking, increased depression and anxiety. States she is concerned that she will fall or hurt herself.  Patient states she called [facility] yesterday and was told to stop one of the 150 mg doses yesterday.

## 2013-04-29 ENCOUNTER — Emergency Department (HOSPITAL_COMMUNITY)
Admission: EM | Admit: 2013-04-29 | Discharge: 2013-04-29 | Disposition: A | Discharge status: Home / Self Care | Payer: No Typology Code available for payment source | Attending: Emergency Medicine | Admitting: Emergency Medicine

## 2013-04-29 ENCOUNTER — Encounter: Payer: Self-pay | Admitting: Family Medicine

## 2013-04-29 ENCOUNTER — Ambulatory Visit (INDEPENDENT_AMBULATORY_CARE_PROVIDER_SITE_OTHER): Payer: Medicare Other | Admitting: Family Medicine

## 2013-04-29 ENCOUNTER — Encounter (HOSPITAL_COMMUNITY): Payer: Self-pay | Admitting: Emergency Medicine

## 2013-04-29 ENCOUNTER — Emergency Department (HOSPITAL_COMMUNITY): Payer: No Typology Code available for payment source

## 2013-04-29 VITALS — BP 90/60 | HR 79 | Temp 98.3°F | Ht 64.0 in | Wt 217.1 lb

## 2013-04-29 DIAGNOSIS — F329 Major depressive disorder, single episode, unspecified: Secondary | ICD-10-CM | POA: Insufficient documentation

## 2013-04-29 DIAGNOSIS — J449 Chronic obstructive pulmonary disease, unspecified: Secondary | ICD-10-CM | POA: Insufficient documentation

## 2013-04-29 DIAGNOSIS — Z87891 Personal history of nicotine dependence: Secondary | ICD-10-CM | POA: Insufficient documentation

## 2013-04-29 DIAGNOSIS — I10 Essential (primary) hypertension: Secondary | ICD-10-CM

## 2013-04-29 DIAGNOSIS — Z8739 Personal history of other diseases of the musculoskeletal system and connective tissue: Secondary | ICD-10-CM | POA: Insufficient documentation

## 2013-04-29 DIAGNOSIS — J441 Chronic obstructive pulmonary disease with (acute) exacerbation: Secondary | ICD-10-CM

## 2013-04-29 DIAGNOSIS — Z8719 Personal history of other diseases of the digestive system: Secondary | ICD-10-CM | POA: Insufficient documentation

## 2013-04-29 DIAGNOSIS — Y9389 Activity, other specified: Secondary | ICD-10-CM | POA: Insufficient documentation

## 2013-04-29 DIAGNOSIS — Z8619 Personal history of other infectious and parasitic diseases: Secondary | ICD-10-CM | POA: Insufficient documentation

## 2013-04-29 DIAGNOSIS — E079 Disorder of thyroid, unspecified: Secondary | ICD-10-CM | POA: Insufficient documentation

## 2013-04-29 DIAGNOSIS — Z8679 Personal history of other diseases of the circulatory system: Secondary | ICD-10-CM | POA: Diagnosis not present

## 2013-04-29 DIAGNOSIS — I129 Hypertensive chronic kidney disease with stage 1 through stage 4 chronic kidney disease, or unspecified chronic kidney disease: Secondary | ICD-10-CM | POA: Diagnosis not present

## 2013-04-29 DIAGNOSIS — D649 Anemia, unspecified: Secondary | ICD-10-CM

## 2013-04-29 DIAGNOSIS — N184 Chronic kidney disease, stage 4 (severe): Secondary | ICD-10-CM | POA: Insufficient documentation

## 2013-04-29 DIAGNOSIS — Z79899 Other long term (current) drug therapy: Secondary | ICD-10-CM | POA: Insufficient documentation

## 2013-04-29 DIAGNOSIS — J4489 Other specified chronic obstructive pulmonary disease: Secondary | ICD-10-CM | POA: Insufficient documentation

## 2013-04-29 DIAGNOSIS — K219 Gastro-esophageal reflux disease without esophagitis: Secondary | ICD-10-CM

## 2013-04-29 DIAGNOSIS — E039 Hypothyroidism, unspecified: Secondary | ICD-10-CM | POA: Insufficient documentation

## 2013-04-29 DIAGNOSIS — G4730 Sleep apnea, unspecified: Secondary | ICD-10-CM

## 2013-04-29 DIAGNOSIS — I2699 Other pulmonary embolism without acute cor pulmonale: Secondary | ICD-10-CM

## 2013-04-29 DIAGNOSIS — Y92410 Unspecified street and highway as the place of occurrence of the external cause: Secondary | ICD-10-CM | POA: Diagnosis not present

## 2013-04-29 DIAGNOSIS — F411 Generalized anxiety disorder: Secondary | ICD-10-CM | POA: Diagnosis not present

## 2013-04-29 DIAGNOSIS — N189 Chronic kidney disease, unspecified: Secondary | ICD-10-CM | POA: Diagnosis not present

## 2013-04-29 DIAGNOSIS — M1990 Unspecified osteoarthritis, unspecified site: Secondary | ICD-10-CM | POA: Insufficient documentation

## 2013-04-29 DIAGNOSIS — I509 Heart failure, unspecified: Secondary | ICD-10-CM | POA: Diagnosis not present

## 2013-04-29 DIAGNOSIS — J45909 Unspecified asthma, uncomplicated: Secondary | ICD-10-CM | POA: Insufficient documentation

## 2013-04-29 DIAGNOSIS — R259 Unspecified abnormal involuntary movements: Secondary | ICD-10-CM

## 2013-04-29 DIAGNOSIS — F3289 Other specified depressive episodes: Secondary | ICD-10-CM | POA: Insufficient documentation

## 2013-04-29 DIAGNOSIS — K5900 Constipation, unspecified: Secondary | ICD-10-CM

## 2013-04-29 DIAGNOSIS — E785 Hyperlipidemia, unspecified: Secondary | ICD-10-CM | POA: Diagnosis not present

## 2013-04-29 DIAGNOSIS — M542 Cervicalgia: Secondary | ICD-10-CM

## 2013-04-29 DIAGNOSIS — F32A Depression, unspecified: Secondary | ICD-10-CM

## 2013-04-29 DIAGNOSIS — F341 Dysthymic disorder: Secondary | ICD-10-CM

## 2013-04-29 DIAGNOSIS — S0993XA Unspecified injury of face, initial encounter: Secondary | ICD-10-CM | POA: Diagnosis not present

## 2013-04-29 DIAGNOSIS — Z7901 Long term (current) use of anticoagulants: Secondary | ICD-10-CM | POA: Insufficient documentation

## 2013-04-29 DIAGNOSIS — R251 Tremor, unspecified: Secondary | ICD-10-CM

## 2013-04-29 LAB — CBC
Platelets: 268 10*3/uL (ref 150–400)
RBC: 4.38 MIL/uL (ref 3.87–5.11)
WBC: 8.1 10*3/uL (ref 4.0–10.5)

## 2013-04-29 MED ORDER — CYCLOBENZAPRINE HCL 10 MG PO TABS: 10.0000 mg | ORAL_TABLET | Freq: Three times a day (TID) | ORAL | Status: DC | PRN

## 2013-04-29 MED ORDER — CYCLOBENZAPRINE HCL 10 MG PO TABS
10.0000 mg | ORAL_TABLET | Freq: Once | ORAL | Status: AC
  Administered 2013-04-29: 10 mg via ORAL
  Filled 2013-04-29: qty 1
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
## 2013-04-29 NOTE — Patient Instructions (Addendum)
Consider a probiotic such as Digestive Advantage, phillip's colon health or a generic   Preventive Care for Adults, Female A healthy lifestyle and preventive care can promote health and wellness. Preventive health guidelines for women include the following key practices.  A routine yearly physical is a good way to check with your caregiver about your health and preventive screening. It is a chance to share any concerns and updates on your health, and to receive a thorough exam.  Visit your dentist for a routine exam and preventive care every 6 months. Brush your teeth twice a day and floss once a day. Good oral hygiene prevents tooth decay and gum disease.  The frequency of eye exams is based on your age, health, family medical history, use of contact lenses, and other factors. Follow your caregiver's recommendations for frequency of eye exams.  Eat a healthy diet. Foods like vegetables, fruits, whole grains, low-fat dairy products, and lean protein foods contain the nutrients you need without too many calories. Decrease your intake of foods high in solid fats, added sugars, and salt. Eat the right amount of calories for you.Get information about a proper diet from your caregiver, if necessary.  Regular physical exercise is one of the most important things you can do for your health. Most adults should get at least 150 minutes of moderate-intensity exercise (any activity that increases your heart rate and causes you to sweat) each week. In addition, most adults need muscle-strengthening exercises on 2 or more days a week.  Maintain a healthy weight. The body mass index (BMI) is a screening tool to identify possible weight problems. It provides an estimate of body fat based on height and weight. Your caregiver can help determine your BMI, and can help you achieve or maintain a healthy weight.For adults 20 years and older:  A BMI below 18.5 is considered underweight.  A BMI of 18.5 to 24.9 is  normal.  A BMI of 25 to 29.9 is considered overweight.  A BMI of 30 and above is considered obese.  Maintain normal blood lipids and cholesterol levels by exercising and minimizing your intake of saturated fat. Eat a balanced diet with plenty of fruit and vegetables. Blood tests for lipids and cholesterol should begin at age 30 and be repeated every 5 years. If your lipid or cholesterol levels are high, you are over 50, or you are at high risk for heart disease, you may need your cholesterol levels checked more frequently.Ongoing high lipid and cholesterol levels should be treated with medicines if diet and exercise are not effective.  If you smoke, find out from your caregiver how to quit. If you do not use tobacco, do not start.  If you are pregnant, do not drink alcohol. If you are breastfeeding, be very cautious about drinking alcohol. If you are not pregnant and choose to drink alcohol, do not exceed 1 drink per day. One drink is considered to be 12 ounces (355 mL) of beer, 5 ounces (148 mL) of wine, or 1.5 ounces (44 mL) of liquor.  Avoid use of street drugs. Do not share needles with anyone. Ask for help if you need support or instructions about stopping the use of drugs.  High blood pressure causes heart disease and increases the risk of stroke. Your blood pressure should be checked at least every 1 to 2 years. Ongoing high blood pressure should be treated with medicines if weight loss and exercise are not effective.  If you are 55 to 65  years old, ask your caregiver if you should take aspirin to prevent strokes.  Diabetes screening involves taking a blood sample to check your fasting blood sugar level. This should be done once every 3 years, after age 34, if you are within normal weight and without risk factors for diabetes. Testing should be considered at a younger age or be carried out more frequently if you are overweight and have at least 1 risk factor for diabetes.  Breast cancer  screening is essential preventive care for women. You should practice "breast self-awareness." This means understanding the normal appearance and feel of your breasts and may include breast self-examination. Any changes detected, no matter how small, should be reported to a caregiver. Women in their 12s and 30s should have a clinical breast exam (CBE) by a caregiver as part of a regular health exam every 1 to 3 years. After age 59, women should have a CBE every year. Starting at age 21, women should consider having a mammography (breast X-ray test) every year. Women who have a family history of breast cancer should talk to their caregiver about genetic screening. Women at a high risk of breast cancer should talk to their caregivers about having magnetic resonance imaging (MRI) and a mammography every year.  The Pap test is a screening test for cervical cancer. A Pap test can show cell changes on the cervix that might become cervical cancer if left untreated. A Pap test is a procedure in which cells are obtained and examined from the lower end of the uterus (cervix).  Women should have a Pap test starting at age 60.  Between ages 75 and 30, Pap tests should be repeated every 2 years.  Beginning at age 39, you should have a Pap test every 3 years as long as the past 3 Pap tests have been normal.  Some women have medical problems that increase the chance of getting cervical cancer. Talk to your caregiver about these problems. It is especially important to talk to your caregiver if a new problem develops soon after your last Pap test. In these cases, your caregiver may recommend more frequent screening and Pap tests.  The above recommendations are the same for women who have or have not gotten the vaccine for human papillomavirus (HPV).  If you had a hysterectomy for a problem that was not cancer or a condition that could lead to cancer, then you no longer need Pap tests. Even if you no longer need a Pap  test, a regular exam is a good idea to make sure no other problems are starting.  If you are between ages 23 and 78, and you have had normal Pap tests going back 10 years, you no longer need Pap tests. Even if you no longer need a Pap test, a regular exam is a good idea to make sure no other problems are starting.  If you have had past treatment for cervical cancer or a condition that could lead to cancer, you need Pap tests and screening for cancer for at least 20 years after your treatment.  If Pap tests have been discontinued, risk factors (such as a new sexual partner) need to be reassessed to determine if screening should be resumed.  The HPV test is an additional test that may be used for cervical cancer screening. The HPV test looks for the virus that can cause the cell changes on the cervix. The cells collected during the Pap test can be tested for HPV. The  HPV test could be used to screen women aged 41 years and older, and should be used in women of any age who have unclear Pap test results. After the age of 47, women should have HPV testing at the same frequency as a Pap test.  Colorectal cancer can be detected and often prevented. Most routine colorectal cancer screening begins at the age of 23 and continues through age 80. However, your caregiver may recommend screening at an earlier age if you have risk factors for colon cancer. On a yearly basis, your caregiver may provide home test kits to check for hidden blood in the stool. Use of a small camera at the end of a tube, to directly examine the colon (sigmoidoscopy or colonoscopy), can detect the earliest forms of colorectal cancer. Talk to your caregiver about this at age 30, when routine screening begins. Direct examination of the colon should be repeated every 5 to 10 years through age 53, unless early forms of pre-cancerous polyps or small growths are found.  Hepatitis C blood testing is recommended for all people born from [birth year] through  1965 and any individual with known risks for hepatitis C.  Practice safe sex. Use condoms and avoid high-risk sexual practices to reduce the spread of sexually transmitted infections (STIs). STIs include gonorrhea, chlamydia, syphilis, trichomonas, herpes, HPV, and human immunodeficiency virus (HIV). Herpes, HIV, and HPV are viral illnesses that have no cure. They can result in disability, cancer, and death. Sexually active women aged 59 and younger should be checked for chlamydia. Older women with new or multiple partners should also be tested for chlamydia. Testing for other STIs is recommended if you are sexually active and at increased risk.  Osteoporosis is a disease in which the bones lose minerals and strength with aging. This can result in serious bone fractures. The risk of osteoporosis can be identified using a bone density scan. Women ages 68 and over and women at risk for fractures or osteoporosis should discuss screening with their caregivers. Ask your caregiver whether you should take a calcium supplement or vitamin D to reduce the rate of osteoporosis.  Menopause can be associated with physical symptoms and risks. Hormone replacement therapy is available to decrease symptoms and risks. You should talk to your caregiver about whether hormone replacement therapy is right for you.  Use sunscreen with sun protection factor (SPF) of 30 or more. Apply sunscreen liberally and repeatedly throughout the day. You should seek shade when your shadow is shorter than you. Protect yourself by wearing long sleeves, pants, a wide-brimmed hat, and sunglasses year round, whenever you are outdoors.  Once a month, do a whole body skin exam, using a mirror to look at the skin on your back. Notify your caregiver of new moles, moles that have irregular borders, moles that are larger than a pencil eraser, or moles that have changed in shape or color.  Stay current with required immunizations.  Influenza. You  need a dose every fall (or winter). The composition of the flu vaccine changes each year, so being vaccinated once is not enough.  Pneumococcal polysaccharide. You need 1 to 2 doses if you smoke cigarettes or if you have certain chronic medical conditions. You need 1 dose at age 22 (or older) if you have never been vaccinated.  Tetanus, diphtheria, pertussis (Tdap, Td). Get 1 dose of Tdap vaccine if you are younger than age 24, are over 43 and have contact with an infant, are a Dietitian, are pregnant,  or simply want to be protected from whooping cough. After that, you need a Td booster dose every 10 years. Consult your caregiver if you have not had at least 3 tetanus and diphtheria-containing shots sometime in your life or have a deep or dirty wound.  HPV. You need this vaccine if you are a woman age 104 or younger. The vaccine is given in 3 doses over 6 months.  Measles, mumps, rubella (MMR). You need at least 1 dose of MMR if you were born in 1957 or later. You may also need a second dose.  Meningococcal. If you are age 85 to 50 and a first-year college student living in a residence hall, or have one of several medical conditions, you need to get vaccinated against meningococcal disease. You may also need additional booster doses.  Zoster (shingles). If you are age 16 or older, you should get this vaccine.  Varicella (chickenpox). If you have never had chickenpox or you were vaccinated but received only 1 dose, talk to your caregiver to find out if you need this vaccine.  Hepatitis A. You need this vaccine if you have a specific risk factor for hepatitis A virus infection or you simply wish to be protected from this disease. The vaccine is usually given as 2 doses, 6 to 18 months apart.  Hepatitis B. You need this vaccine if you have a specific risk factor for hepatitis B virus infection or you simply wish to be protected from this disease. The vaccine is given in 3 doses, usually over 6  months. Preventive Services / Frequency Ages 59 to 9  Blood pressure check.** / Every 1 to 2 years.  Lipid and cholesterol check.** / Every 5 years beginning at age 85.  Clinical breast exam.** / Every 3 years for women in their 62s and 72s.  Pap test.** / Every 2 years from ages 66 through 21. Every 3 years starting at age 1 through age 29 or 47 with a history of 3 consecutive normal Pap tests.  HPV screening.** / Every 3 years from ages 86 through ages 56 to 40 with a history of 3 consecutive normal Pap tests.  Hepatitis C blood test.** / For any individual with known risks for hepatitis C.  Skin self-exam. / Monthly.  Influenza immunization.** / Every year.  Pneumococcal polysaccharide immunization.** / 1 to 2 doses if you smoke cigarettes or if you have certain chronic medical conditions.  Tetanus, diphtheria, pertussis (Tdap, Td) immunization. / A one-time dose of Tdap vaccine. After that, you need a Td booster dose every 10 years.  HPV immunization. / 3 doses over 6 months, if you are 59 and younger.  Measles, mumps, rubella (MMR) immunization. / You need at least 1 dose of MMR if you were born in 1957 or later. You may also need a second dose.  Meningococcal immunization. / 1 dose if you are age 40 to 62 and a first-year college student living in a residence hall, or have one of several medical conditions, you need to get vaccinated against meningococcal disease. You may also need additional booster doses.  Varicella immunization.** / Consult your caregiver.  Hepatitis A immunization.** / Consult your caregiver. 2 doses, 6 to 18 months apart.  Hepatitis B immunization.** / Consult your caregiver. 3 doses usually over 6 months. Ages 51 to 57  Blood pressure check.** / Every 1 to 2 years.  Lipid and cholesterol check.** / Every 5 years beginning at age 55.  Clinical breast exam.** /  Every year after age 41.  Mammogram.** / Every year beginning at age 66 and  continuing for as long as you are in good health. Consult with your caregiver.  Pap test.** / Every 3 years starting at age 22 through age 57 or 24 with a history of 3 consecutive normal Pap tests.  HPV screening.** / Every 3 years from ages 32 through ages 52 to 31 with a history of 3 consecutive normal Pap tests.  Fecal occult blood test (FOBT) of stool. / Every year beginning at age 9 and continuing until age 12. You may not need to do this test if you get a colonoscopy every 10 years.  Flexible sigmoidoscopy or colonoscopy.** / Every 5 years for a flexible sigmoidoscopy or every 10 years for a colonoscopy beginning at age 1 and continuing until age 86.  Hepatitis C blood test.** / For all people born from [birth year] through 1963 and any individual with known risks for hepatitis C.  Skin self-exam. / Monthly.  Influenza immunization.** / Every year.  Pneumococcal polysaccharide immunization.** / 1 to 2 doses if you smoke cigarettes or if you have certain chronic medical conditions.  Tetanus, diphtheria, pertussis (Tdap, Td) immunization.** / A one-time dose of Tdap vaccine. After that, you need a Td booster dose every 10 years.  Measles, mumps, rubella (MMR) immunization. / You need at least 1 dose of MMR if you were born in 1957 or later. You may also need a second dose.  Varicella immunization.** / Consult your caregiver.  Meningococcal immunization.** / Consult your caregiver.  Hepatitis A immunization.** / Consult your caregiver. 2 doses, 6 to 18 months apart.  Hepatitis B immunization.** / Consult your caregiver. 3 doses, usually over 6 months. Ages 63 and over  Blood pressure check.** / Every 1 to 2 years.  Lipid and cholesterol check.** / Every 5 years beginning at age 29.  Clinical breast exam.** / Every year after age 1.  Mammogram.** / Every year beginning at age 64 and continuing for as long as you are in good health. Consult with your caregiver.  Pap test.** / Every  3 years starting at age 1 through age 38 or 25 with a 3 consecutive normal Pap tests. Testing can be stopped between 65 and 70 with 3 consecutive normal Pap tests and no abnormal Pap or HPV tests in the past 10 years.  HPV screening.** / Every 3 years from ages 34 through ages 20 or 61 with a history of 3 consecutive normal Pap tests. Testing can be stopped between 65 and 70 with 3 consecutive normal Pap tests and no abnormal Pap or HPV tests in the past 10 years.  Fecal occult blood test (FOBT) of stool. / Every year beginning at age 37 and continuing until age 28. You may not need to do this test if you get a colonoscopy every 10 years.  Flexible sigmoidoscopy or colonoscopy.** / Every 5 years for a flexible sigmoidoscopy or every 10 years for a colonoscopy beginning at age 1 and continuing until age 79.  Hepatitis C blood test.** / For all people born from [birth year] through 1963 and any individual with known risks for hepatitis C.  Osteoporosis screening.** / A one-time screening for women ages 89 and over and women at risk for fractures or osteoporosis.  Skin self-exam. / Monthly.  Influenza immunization.** / Every year.  Pneumococcal polysaccharide immunization.** / 1 dose at age 77 (or older) if you have never been vaccinated.  Tetanus, diphtheria, pertussis (  Tdap, Td) immunization. / A one-time dose of Tdap vaccine if you are over 65 and have contact with an infant, are a Dietitian, or simply want to be protected from whooping cough. After that, you need a Td booster dose every 10 years.  Varicella immunization.** / Consult your caregiver.  Meningococcal immunization.** / Consult your caregiver.  Hepatitis A immunization.** / Consult your caregiver. 2 doses, 6 to 18 months apart.  Hepatitis B immunization.** / Check with your caregiver. 3 doses, usually over 6 months. ** Family history and personal history of risk and conditions may change your caregiver's  recommendations. Document Released: 09/12/2001 Document Revised: 10/09/2011 Document Reviewed: 12/12/2010 [facility] Patient Information 2014 [place], [place].

## 2013-04-29 NOTE — Progress Notes (Signed)
Patient ID: Emily Mitchell, female   DOB: [birth year], 67 y.o.   MRN: [id number] DARIEN STANDRIDGE [id number] [birth year] 04/29/2013      Progress Note New Patient  Subjective  Chief Complaint  Chief Complaint  Patient presents with  . Establish Care    new patient  . Injections    TDAP and FLU    HPI  Patient is a 67 year old Caucasian female who is here today to establish care. She recently had bupropion added to her medications to help with her anxiety and depression but unfortunately developed worsening tremors on the medications but had to be stopped. She is tolerating her citalopram and would like to stick with that for now. Her rash is improving as a result. No other recent illness. No chest pain, palpitations, shortness of breath, GI or GU concerns noted today.  Past Medical History  Diagnosis Date  . Carotid artery occlusion   . Hyperlipidemia   . Hypertension   . Depression with anxiety   . COPD (chronic obstructive pulmonary disease)   . Chicken pox as a child  . Mumps 8 yrs old  . GERD (gastroesophageal reflux disease)   . Asthma   . Arthritis   . Depression   . Hypothyroidism   . Chronic kidney disease     Bright's Disease at age 19   . Tremor   . CHF (congestive heart failure)   . Anxiety and depression 12/14/2008    Qualifier: Diagnosis of  By: [name] LPN, Almyra Free      Past Surgical History  Procedure Laterality Date  . Carotid endarterectomy Right 05/10/07    cea  . Knees replaced  2005 and 2011    both knees  . Surgery on neck  2010    carotid endarterectomy  . Wisdom tooth extraction    . Abdominal hysterectomy  1984  . Joint replacement      b/l knees    Family History  Problem Relation Age of Onset  . Stroke Mother     mini stroke  . Kidney disease Mother   . Heart failure Mother   . Hypertension Mother   . Diabetes Sister     type 2  . Hyperlipidemia Sister     History   Social History  . Marital Status: Legally Separated    Spouse  Name: N/A    Number of Children: N/A  . Years of Education: N/A   Occupational History  . Not on file.   Social History Main Topics  . Smoking status: Former Smoker -- 30 years    Quit date: 12/03/1994  . Smokeless tobacco: Never Used  . Alcohol Use: No  . Drug Use: No  . Sexual Activity: No   Other Topics Concern  . Not on file   Social History Narrative  . No narrative on file    Current Outpatient Prescriptions on File Prior to Visit  Medication Sig Dispense Refill  . B Complex Vitamins (B-COMPLEX/B-12 TR PO) Take 1 tablet by mouth daily.       . calcium citrate-vitamin D (CITRACAL+D) 315-200 MG-UNIT per tablet Take 1 tablet by mouth 2 (two) times daily.      . Cholecalciferol (D3 MAXIMUM STRENGTH) 5000 UNITS capsule Take 5,000 Units by mouth daily.      . Choline Fenofibrate (FENOFIBRIC ACID) 135 MG CPDR Take 1 tablet by mouth daily.       . citalopram (CELEXA) 40 MG tablet Take 40 mg by mouth daily.       Marland Kitchen  clonazePAM (KLONOPIN) 1 MG tablet Take 1 tablet (1 mg total) by mouth 3 (three) times daily as needed for anxiety.  30 tablet  0  . levalbuterol (XOPENEX) 1.25 MG/0.5ML nebulizer solution Take 1 ampule by nebulization every 4 (four) hours as needed for wheezing.      Marland Kitchen levothyroxine (SYNTHROID, LEVOTHROID) 137 MCG tablet Take 137 mcg by mouth daily before breakfast.       . montelukast (SINGULAIR) 10 MG tablet Take 10 mg by mouth at bedtime.       . Multiple Vitamins-Minerals (PRESERVISION AREDS PO) Take 10 g by mouth every morning.      Marland Kitchen NEXIUM 40 MG capsule Take 40 mg by mouth daily before breakfast.       . telmisartan-hydrochlorothiazide (MICARDIS HCT) 80-12.5 MG per tablet Take 1 tablet by mouth daily.       . traMADol (ULTRAM) 50 MG tablet Take 50 mg by mouth every 6 (six) hours as needed for pain.      Marland Kitchen ZETIA 10 MG tablet Take 10 mg by mouth daily.        No current facility-administered medications on file prior to visit.    Allergies  Allergen Reactions   . Niaspan [Niacin Er] Nausea And Vomiting and Swelling    Swelling in mouth  . Pantoprazole     Mouth sores  . Bupropion Other (See Comments)    Uncontrollable shakes  . Penicillins Hives  . Sulfonamide Derivatives Hives    Review of Systems  Review of Systems  Constitutional: Negative for fever, chills and malaise/fatigue.  HENT: Negative for hearing loss, nosebleeds and congestion.   Eyes: Negative for discharge.  Respiratory: Negative for cough, sputum production, shortness of breath and wheezing.   Cardiovascular: Negative for chest pain, palpitations and leg swelling.  Gastrointestinal: Negative for heartburn, nausea, vomiting, abdominal pain, diarrhea, constipation and blood in stool.  Genitourinary: Negative for dysuria, urgency, frequency and hematuria.  Musculoskeletal: Negative for myalgias, back pain and falls.  Skin: Negative for rash.  Neurological: Negative for dizziness, tremors, sensory change, focal weakness, loss of consciousness, weakness and headaches.  Endo/Heme/Allergies: Negative for polydipsia. Does not bruise/bleed easily.  Psychiatric/Behavioral: Negative for depression and suicidal ideas. The patient is not nervous/anxious and does not have insomnia.     Objective  BP 90/60  Pulse 79  Temp(Src) 98.3 F (36.8 C) (Oral)  Ht 5\' 4"  (1.626 m)  Wt 217 lb 1.9 oz (98.485 kg)  BMI 37.25 kg/m2  SpO2 93%  Physical Exam  Physical Exam  Constitutional: She is oriented to person, place, and time and well-developed, well-nourished, and in no distress. No distress.  HENT:  Head: Normocephalic and atraumatic.  Eyes: Conjunctivae are normal.  Neck: Neck supple. No thyromegaly present.  Cardiovascular: Normal rate, regular rhythm and normal heart sounds.   No murmur heard. Pulmonary/Chest: Effort normal and breath sounds normal. She has no wheezes.  Abdominal: She exhibits no distension and no mass.  Musculoskeletal: She exhibits no edema.   Lymphadenopathy:    She has no cervical adenopathy.  Neurological: She is alert and oriented to person, place, and time.  Skin: Skin is warm and dry. No rash noted. She is not diaphoretic.  Psychiatric: Memory, affect and judgment normal.       Assessment & Plan  CHF (congestive heart failure) Patient reports diagnosis of CHF noted during her hospitalization in July of 2014 for PE. Encouraged DASH diet and referred to cardiology for ongoing treatments.  GERD Encouraged  to add probiotic, avoid offending foods and continue meds as needed  CONSTIPATION Add fiber, fluids and probiotics  Hypothyroidism Request old records repeat labs prior to next visit. Continue current dose of Levothyroxine for now  ANEMIA Repeat cbc prior to next visit.  SLEEP APNEA Not on CPAP  Tremor Patient notes long history not worsening   Anxiety and depression Did not tolerate Bupropion may continue Citalopram for now

## 2013-04-29 NOTE — Assessment & Plan Note (Signed)
Repeat cbc prior to next visit.

## 2013-04-29 NOTE — Assessment & Plan Note (Signed)
Patient notes long history not worsening

## 2013-04-29 NOTE — Assessment & Plan Note (Signed)
Patient reports diagnosis of CHF noted during her hospitalization in July of 2014 for PE. Encouraged DASH diet and referred to cardiology for ongoing treatments.

## 2013-04-29 NOTE — Assessment & Plan Note (Signed)
Request old records repeat labs prior to next visit. Continue current dose of Levothyroxine for now

## 2013-04-29 NOTE — ED Provider Notes (Signed)
I was asked by Dr. Tomi Bamberger to review the CT scan results, which are normal.  Patient will be discharged home per Dr. plan  Garald Balding, NP 04/29/13 2130
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
## 2013-04-29 NOTE — ED Notes (Signed)
Pt ambulated to restroom with slow, steady gait, with stand by assist.

## 2013-04-29 NOTE — Assessment & Plan Note (Signed)
Did not tolerate Bupropion may continue Citalopram for now

## 2013-04-29 NOTE — Assessment & Plan Note (Signed)
Encouraged to add probiotic, avoid offending foods and continue meds as needed

## 2013-04-29 NOTE — Assessment & Plan Note (Signed)
Add fiber, fluids and probiotics

## 2013-04-30 LAB — PROTIME-INR
INR: 2.8 — ABNORMAL HIGH (ref <1.50)
Prothrombin Time: 28.3 seconds — ABNORMAL HIGH (ref 11.6–15.2)

## 2013-04-30 LAB — HEPATIC FUNCTION PANEL
Alkaline Phosphatase: 38 U/L — ABNORMAL LOW (ref 39–117)
Bilirubin, Direct: 0.1 mg/dL (ref 0.0–0.3)
Indirect Bilirubin: 0.4 mg/dL (ref 0.0–0.9)
Total Bilirubin: 0.5 mg/dL (ref 0.3–1.2)

## 2013-04-30 LAB — RENAL FUNCTION PANEL
BUN: 33 mg/dL — ABNORMAL HIGH (ref 6–23)
Chloride: 102 mEq/L (ref 96–112)
Phosphorus: 2.6 mg/dL (ref 2.3–4.6)
Potassium: 4.5 mEq/L (ref 3.5–5.3)
Sodium: 141 mEq/L (ref 135–145)

## 2013-05-01 NOTE — ED Provider Notes (Signed)
Medical screening examination/treatment/procedure(s) were performed by non-physician practitioner and as supervising physician I was immediately available for consultation/collaboration. Rolland Porter, MD, Abram Sander   Janice Norrie, MD 05/01/13 [phone]

## 2013-05-12 ENCOUNTER — Ambulatory Visit (INDEPENDENT_AMBULATORY_CARE_PROVIDER_SITE_OTHER): Payer: Medicare Other | Admitting: Critical Care Medicine

## 2013-05-12 ENCOUNTER — Telehealth: Payer: Self-pay | Admitting: Family Medicine

## 2013-05-12 ENCOUNTER — Encounter: Payer: Self-pay | Admitting: Critical Care Medicine

## 2013-05-12 ENCOUNTER — Ambulatory Visit (HOSPITAL_BASED_OUTPATIENT_CLINIC_OR_DEPARTMENT_OTHER)
Admission: RE | Admit: 2013-05-12 | Discharge: 2013-05-12 | Disposition: A | Discharge status: Home / Self Care | Payer: Medicare Other | Source: Ambulatory Visit | Attending: Critical Care Medicine | Admitting: Critical Care Medicine

## 2013-05-12 VITALS — BP 124/78 | HR 70 | Temp 97.9°F | Ht 63.0 in | Wt 219.0 lb

## 2013-05-12 DIAGNOSIS — J438 Other emphysema: Secondary | ICD-10-CM | POA: Insufficient documentation

## 2013-05-12 DIAGNOSIS — Z87891 Personal history of nicotine dependence: Secondary | ICD-10-CM | POA: Insufficient documentation

## 2013-05-12 DIAGNOSIS — J449 Chronic obstructive pulmonary disease, unspecified: Secondary | ICD-10-CM

## 2013-05-12 DIAGNOSIS — J439 Emphysema, unspecified: Secondary | ICD-10-CM

## 2013-05-12 NOTE — Progress Notes (Signed)
Quick Note:  Called, spoke with pt. Informed her of cxr results and recs per Dr. Wright. She verbalized understanding and voiced no further questions or concerns at this time. ______ 

## 2013-05-12 NOTE — Telephone Encounter (Signed)
Received medical records from [facility] °

## 2013-05-12 NOTE — Progress Notes (Signed)
Subjective:    Patient ID: Emily Mitchell, female    DOB: 1945/08/11, 67 y.o.   MRN: [id number]  HPI Comments: Copd dx 2004.  Pt saw [facility] in the past.   Pt notes dyspnea.  Pt in [place] in 01/2013, had PE dx. At [facility]  Shortness of Breath This is a chronic problem. The current episode started more than 1 year ago. The problem occurs every several days. The problem has been unchanged. Associated symptoms include PND. Pertinent negatives include no abdominal pain, chest pain, claudication, coryza, ear pain, fever, headaches, hemoptysis, leg pain, leg swelling, neck pain, orthopnea, rash, rhinorrhea, sore throat, sputum production, swollen glands, syncope, vomiting or wheezing. The symptoms are aggravated by smoke, weather changes, fumes, exercise, any activity, odors, pollens, emotional upset and lying flat. Associated symptoms comments: No real cough, just DOE.Marland Kitchen Risk factors include smoking. She has tried beta agonist inhalers for the symptoms. The treatment provided moderate relief. Her past medical history is significant for allergies, COPD, PE and pneumonia. There is no history of aspirin allergies, asthma, bronchiolitis, CAD, chronic lung disease, DVT, a heart failure or a recent surgery. (Tested at [employer]: apples, bananas, trees , grasses)  Hx of sleep apnea, cpap d/c'd some time ago  Past Medical History  Diagnosis Date  . Carotid artery occlusion   . Hyperlipidemia   . Hypertension   . Depression with anxiety   . COPD (chronic obstructive pulmonary disease)   . Chicken pox as a child  . Mumps 89 yrs old  . GERD (gastroesophageal reflux disease)   . Asthma   . Arthritis   . Depression   . Hypothyroidism   . Chronic kidney disease     Bright's Disease at age 58   . Tremor   . CHF (congestive heart failure)   . Anxiety and depression 12/14/2008    Qualifier: Diagnosis of  By: [name] LPN, Almyra Free       Family History  Problem Relation Age of Onset  . Stroke  Mother     mini stroke  . Kidney disease Mother   . Heart failure Mother   . Hypertension Mother   . Diabetes Sister     type 2  . Hyperlipidemia Sister      History   Social History  . Marital Status: Legally Separated    Spouse Name: N/A    Number of Children: N/A  . Years of Education: N/A   Occupational History  . Not on file.   Social History Main Topics  . Smoking status: Former Smoker -- 2.00 packs/day for 30 years    Types: Cigarettes    Quit date: 12/03/1994  . Smokeless tobacco: Never Used  . Alcohol Use: No  . Drug Use: No  . Sexual Activity: No   Other Topics Concern  . Not on file   Social History Narrative  . No narrative on file     Allergies  Allergen Reactions  . Niaspan [Niacin Er] Nausea And Vomiting and Swelling    Swelling in mouth  . Pantoprazole     Mouth sores  . Bupropion Other (See Comments)    Uncontrollable shakes  . Penicillins Hives  . Sulfonamide Derivatives Hives     Outpatient Prescriptions Prior to Visit  Medication Sig Dispense Refill  . albuterol (PROVENTIL HFA;VENTOLIN HFA) 108 (90 BASE) MCG/ACT inhaler Inhale 2 puffs into the lungs every 4 (four) hours as needed for wheezing.      Marland Kitchen B  Complex Vitamins (B-COMPLEX/B-12 TR PO) Take 1 tablet by mouth daily.       . calcium citrate-vitamin D (CITRACAL+D) 315-200 MG-UNIT per tablet Take 1 tablet by mouth 2 (two) times daily.      . Cholecalciferol (D3 MAXIMUM STRENGTH) 5000 UNITS capsule Take 5,000 Units by mouth daily.      . Choline Fenofibrate (FENOFIBRIC ACID) 135 MG CPDR Take 1 tablet by mouth daily.       . citalopram (CELEXA) 40 MG tablet Take 40 mg by mouth daily.       . clonazePAM (KLONOPIN) 1 MG tablet Take 1 tablet (1 mg total) by mouth 3 (three) times daily as needed for anxiety.  30 tablet  0  . cyclobenzaprine (FLEXERIL) 10 MG tablet Take 1 tablet (10 mg total) by mouth 3 (three) times daily as needed for muscle spasms.  30 tablet  0  . Fluticasone-Salmeterol  (ADVAIR) 250-50 MCG/DOSE AEPB Inhale 1 puff into the lungs every 12 (twelve) hours.      Marland Kitchen levalbuterol (XOPENEX) 1.25 MG/0.5ML nebulizer solution Take 1 ampule by nebulization every 4 (four) hours as needed for wheezing.      Marland Kitchen levothyroxine (SYNTHROID, LEVOTHROID) 137 MCG tablet Take 137 mcg by mouth daily before breakfast.       . montelukast (SINGULAIR) 10 MG tablet Take 10 mg by mouth at bedtime.       . Multiple Vitamins-Minerals (PRESERVISION AREDS PO) Take 10 g by mouth every morning.      Marland Kitchen NEXIUM 40 MG capsule Take 40 mg by mouth daily before breakfast.       . telmisartan-hydrochlorothiazide (MICARDIS HCT) 80-12.5 MG per tablet Take 1 tablet by mouth daily.       . traMADol (ULTRAM) 50 MG tablet Take 50 mg by mouth every 6 (six) hours as needed for pain.      Marland Kitchen warfarin (COUMADIN) 5 MG tablet Take 5 mg by mouth daily.      Marland Kitchen ZETIA 10 MG tablet Take 10 mg by mouth daily.        No facility-administered medications prior to visit.      Review of Systems  Constitutional: Positive for appetite change and unexpected weight change. Negative for fever, chills, diaphoresis, activity change and fatigue.  HENT: Negative for congestion, dental problem, ear discharge, ear pain, facial swelling, hearing loss, mouth sores, nosebleeds, postnasal drip, rhinorrhea, sinus pressure, sneezing, sore throat, tinnitus, trouble swallowing and voice change.   Eyes: Negative for photophobia, discharge, itching and visual disturbance.  Respiratory: Positive for choking and shortness of breath. Negative for apnea, cough, hemoptysis, sputum production, chest tightness, wheezing and stridor.   Cardiovascular: Positive for PND. Negative for chest pain, palpitations, orthopnea, claudication, leg swelling and syncope.  Gastrointestinal: Negative for nausea, vomiting, abdominal pain, constipation, blood in stool and abdominal distention.  Genitourinary: Negative for dysuria, urgency, frequency, hematuria, flank  pain, decreased urine volume and difficulty urinating.  Musculoskeletal: Negative for arthralgias, back pain, gait problem, joint swelling, myalgias, neck pain and neck stiffness.  Skin: Negative for color change, pallor and rash.  Neurological: Negative for dizziness, tremors, seizures, syncope, speech difficulty, weakness, light-headedness, numbness and headaches.  Hematological: Negative for adenopathy. Does not bruise/bleed easily.  Psychiatric/Behavioral: Negative for confusion, sleep disturbance and agitation. The patient is not nervous/anxious.        Objective:   Physical Exam Filed Vitals:   05/12/13 1023  BP: 124/78  Pulse: 70  Temp: 97.9 F (36.6 C)  TempSrc: Oral  Height: 5\' 3"  (1.6 m)  Weight: 219 lb (99.338 kg)  SpO2: 95%    Gen: Pleasant, obese, in no distress,  normal affect  ENT: No lesions,  mouth clear,  oropharynx clear, no postnasal drip  Neck: No JVD, no TMG, no carotid bruits  Lungs: No use of accessory muscles, no dullness to percussion, distant breath sounds  Cardiovascular: RRR, heart sounds normal, no murmur or gallops, no peripheral edema  Abdomen: soft and NT, no HSM,  BS normal  Musculoskeletal: No deformities, no cyanosis or clubbing  Neuro: alert, non focal  Skin: Warm, no lesions or rashes  No results found.   Spirometry 05/12/2013: FEV1 72% predicted FEV1 FEC ratio 73% predicted FEF 25 75  43% predicted      Assessment & Plan:   CHRONIC OBSTRUCTIVE PULMONARY DISEASE, OXYGEN DEPENDENT Gold stage B. COPD with nocturnal oxygenation failure but normal oxygenation status with ambulation and at rest Airway function fairly well controlled at this point Plan No need for oxygen with exertion Stay on Advair twice daily Focus on weight loss Referral to pulmonary rehab will be made Records from [facility] and [facility] Starc[facility] will be obtained Chest xray today will be obtained Alpha one antitrypsin assay will be obtained Return 3  months    Updated Medication List Outpatient Encounter Prescriptions as of 05/12/2013  Medication Sig Dispense Refill  . albuterol (PROVENTIL HFA;VENTOLIN HFA) 108 (90 BASE) MCG/ACT inhaler Inhale 2 puffs into the lungs every 4 (four) hours as needed for wheezing.      . B Complex Vitamins (B-COMPLEX/B-12 TR PO) Take 1 tablet by mouth daily.       . calcium citrate-vitamin D (CITRACAL+D) 315-200 MG-UNIT per tablet Take 1 tablet by mouth 2 (two) times daily.      . Cholecalciferol (D3 MAXIMUM STRENGTH) 5000 UNITS capsule Take 5,000 Units by mouth daily.      . Choline Fenofibrate (FENOFIBRIC ACID) 135 MG CPDR Take 1 tablet by mouth daily.       . citalopram (CELEXA) 40 MG tablet Take 40 mg by mouth daily.       . clonazePAM (KLONOPIN) 1 MG tablet Take 1 tablet (1 mg total) by mouth 3 (three) times daily as needed for anxiety.  30 tablet  0  . cyclobenzaprine (FLEXERIL) 10 MG tablet Take 1 tablet (10 mg total) by mouth 3 (three) times daily as needed for muscle spasms.  30 tablet  0  . Fluticasone-Salmeterol (ADVAIR) 250-50 MCG/DOSE AEPB Inhale 1 puff into the lungs every 12 (twelve) hours.      Marland Kitchen levalbuterol (XOPENEX) 1.25 MG/0.5ML nebulizer solution Take 1 ampule by nebulization every 4 (four) hours as needed for wheezing.      Marland Kitchen levothyroxine (SYNTHROID, LEVOTHROID) 137 MCG tablet Take 137 mcg by mouth daily before breakfast.       . montelukast (SINGULAIR) 10 MG tablet Take 10 mg by mouth at bedtime.       . Multiple Vitamins-Minerals (PRESERVISION AREDS PO) Take 10 g by mouth every morning.      Marland Kitchen NEXIUM 40 MG capsule Take 40 mg by mouth daily before breakfast.       . telmisartan-hydrochlorothiazide (MICARDIS HCT) 80-12.5 MG per tablet Take 1 tablet by mouth daily.       . traMADol (ULTRAM) 50 MG tablet Take 50 mg by mouth every 6 (six) hours as needed for pain.      Marland Kitchen warfarin (COUMADIN) 5 MG tablet Take 5 mg by mouth daily.      Marland Kitchen  ZETIA 10 MG tablet Take 10 mg by mouth daily.         No facility-administered encounter medications on file as of 05/12/2013.

## 2013-05-12 NOTE — Assessment & Plan Note (Signed)
Gold stage B. COPD with nocturnal oxygenation failure but normal oxygenation status with ambulation and at rest Airway function fairly well controlled at this point Plan No need for oxygen with exertion Stay on Advair twice daily Focus on weight loss Referral to pulmonary rehab will be made Records from [facility] and [facility] Starcher will be obtained Chest xray today will be obtained Alpha one antitrypsin assay will be obtained Return 3 months

## 2013-05-12 NOTE — Patient Instructions (Signed)
No need for oxygen with exertion Stay on Advair twice daily Focus on weight loss Referral to pulmonary rehab will be made Records from [place] will be obtained Chest xray today will be obtained Alpha one antitrypsin assay will be obtained Return 3 months

## 2013-05-12 NOTE — Progress Notes (Signed)
Quick Note:  Notify the patient that the Xray is stable and no pneumonia No change in medications are recommended. Continue current meds as prescribed at last office visit ______ 

## 2013-05-14 LAB — ALPHA-1-ANTITRYPSIN: A-1 Antitrypsin, Ser: 135 mg/dL (ref 90–200)

## 2013-05-14 NOTE — Progress Notes (Signed)
Quick Note:  Call pt and tell her labs are ok, normal alpha one levels ______

## 2013-05-14 NOTE — Progress Notes (Signed)
Quick Note:  Called, spoke with pt. Informed her of lab results per PW. She verbalized understanding. ______

## 2013-05-16 LAB — ALPHA-1 ANTITRYPSIN PHENOTYPE: A-1 Antitrypsin: 137 mg/dL (ref 83–199)

## 2013-05-21 ENCOUNTER — Ambulatory Visit: Payer: Medicare Other | Admitting: Cardiovascular Disease

## 2013-05-26 ENCOUNTER — Encounter: Payer: Self-pay | Admitting: Family Medicine

## 2013-05-26 ENCOUNTER — Ambulatory Visit (INDEPENDENT_AMBULATORY_CARE_PROVIDER_SITE_OTHER): Payer: Medicare Other | Admitting: Family Medicine

## 2013-05-26 VITALS — BP 122/72 | HR 83 | Temp 97.7°F | Ht 64.0 in | Wt 216.0 lb

## 2013-05-26 DIAGNOSIS — F341 Dysthymic disorder: Secondary | ICD-10-CM

## 2013-05-26 DIAGNOSIS — F329 Major depressive disorder, single episode, unspecified: Secondary | ICD-10-CM

## 2013-05-26 DIAGNOSIS — G4730 Sleep apnea, unspecified: Secondary | ICD-10-CM

## 2013-05-26 DIAGNOSIS — E782 Mixed hyperlipidemia: Secondary | ICD-10-CM | POA: Insufficient documentation

## 2013-05-26 DIAGNOSIS — E785 Hyperlipidemia, unspecified: Secondary | ICD-10-CM

## 2013-05-26 DIAGNOSIS — E039 Hypothyroidism, unspecified: Secondary | ICD-10-CM

## 2013-05-26 DIAGNOSIS — Z23 Encounter for immunization: Secondary | ICD-10-CM

## 2013-05-26 DIAGNOSIS — I2699 Other pulmonary embolism without acute cor pulmonale: Secondary | ICD-10-CM

## 2013-05-26 DIAGNOSIS — N189 Chronic kidney disease, unspecified: Secondary | ICD-10-CM

## 2013-05-26 DIAGNOSIS — K759 Inflammatory liver disease, unspecified: Secondary | ICD-10-CM

## 2013-05-26 DIAGNOSIS — I509 Heart failure, unspecified: Secondary | ICD-10-CM

## 2013-05-26 DIAGNOSIS — F32A Depression, unspecified: Secondary | ICD-10-CM

## 2013-05-26 HISTORY — DX: Other pulmonary embolism without acute cor pulmonale: I26.99

## 2013-05-26 HISTORY — DX: Mixed hyperlipidemia: E78.2

## 2013-05-26 MED ORDER — CITALOPRAM HYDROBROMIDE 40 MG PO TABS: 40.0000 mg | ORAL_TABLET | Freq: Every day | ORAL | Status: DC

## 2013-05-26 NOTE — Patient Instructions (Signed)
Sleep Apnea  Sleep apnea is a sleep disorder characterized by abnormal pauses in breathing while you sleep. When your breathing pauses, the level of oxygen in your blood decreases. This causes you to move out of deep sleep and into light sleep. As a result, your quality of sleep is poor, and the system that carries your blood throughout your body (cardiovascular system) experiences stress. If sleep apnea remains untreated, the following conditions can develop:  High blood pressure (hypertension).  Coronary artery disease.  Inability to achieve or maintain an erection (impotence).  Impairment of your thought process (cognitive dysfunction). There are three types of sleep apnea: 1. Obstructive sleep apnea Pauses in breathing during sleep because of a blocked airway. 2. Central sleep apnea Pauses in breathing during sleep because the area of the brain that controls your breathing does not send the correct signals to the muscles that control breathing. 3. Mixed sleep apnea A combination of both obstructive and central sleep apnea. RISK FACTORS The following risk factors can increase your risk of developing sleep apnea:  Being overweight.  Smoking.  Having narrow passages in your nose and throat.  Being of older age.  Being female.  Alcohol use.  Sedative and tranquilizer use.  Ethnicity. Among individuals younger than 35 years, African Americans are at increased risk of sleep apnea. SYMPTOMS   Difficulty staying asleep.  Daytime sleepiness and fatigue.  Loss of energy.  Irritability.  Loud, heavy snoring.  Morning headaches.  Trouble concentrating.  Forgetfulness.  Decreased interest in sex. DIAGNOSIS  In order to diagnose sleep apnea, your caregiver will perform a physical examination. Your caregiver may suggest that you take a home sleep test. Your caregiver may also recommend that you spend the night in a sleep lab. In the sleep lab, several monitors record  information about your heart, lungs, and brain while you sleep. Your leg and arm movements and blood oxygen level are also recorded. TREATMENT The following actions may help to resolve mild sleep apnea:  Sleeping on your side.   Using a decongestant if you have nasal congestion.   Avoiding the use of depressants, including alcohol, sedatives, and narcotics.   Losing weight and modifying your diet if you are overweight. There also are devices and treatments to help open your airway:  Oral appliances. These are custom-made mouthpieces that shift your lower jaw forward and slightly open your bite. This opens your airway.  Devices that create positive airway pressure. This positive pressure "splints" your airway open to help you breathe better during sleep. The following devices create positive airway pressure:  Continuous positive airway pressure (CPAP) device. The CPAP device creates a continuous level of air pressure with an air pump. The air is delivered to your airway through a mask while you sleep. This continuous pressure keeps your airway open.  Nasal expiratory positive airway pressure (EPAP) device. The EPAP device creates positive air pressure as you exhale. The device consists of single-use valves, which are inserted into each nostril and held in place by adhesive. The valves create very little resistance when you inhale but create much more resistance when you exhale. That increased resistance creates the positive airway pressure. This positive pressure while you exhale keeps your airway open, making it easier to breath when you inhale again.  Bilevel positive airway pressure (BPAP) device. The BPAP device is used mainly in patients with central sleep apnea. This device is similar to the CPAP device because it also uses an air pump to deliver  continuous air pressure through a mask. However, with the BPAP machine, the pressure is set at two different levels. The pressure when you  exhale is lower than the pressure when you inhale.  Surgery. Typically, surgery is only done if you cannot comply with less invasive treatments or if the less invasive treatments do not improve your condition. Surgery involves removing excess tissue in your airway to create a wider passage way. Document Released: 07/07/2002 Document Revised: 01/16/2012 Document Reviewed: 11/23/2011 [facility] Patient Information 2014 [place].

## 2013-05-27 ENCOUNTER — Telehealth: Payer: Self-pay | Admitting: *Deleted

## 2013-05-27 DIAGNOSIS — I2699 Other pulmonary embolism without acute cor pulmonale: Secondary | ICD-10-CM

## 2013-05-27 LAB — HEPATIC FUNCTION PANEL
ALT: 20 U/L (ref 0–35)
AST: 21 U/L (ref 0–37)
Bilirubin, Direct: 0.1 mg/dL (ref 0.0–0.3)
Indirect Bilirubin: 0.4 mg/dL (ref 0.0–0.9)
Total Bilirubin: 0.5 mg/dL (ref 0.3–1.2)

## 2013-05-27 LAB — PROTIME-INR: INR: 3.25 — ABNORMAL HIGH (ref <1.50)

## 2013-05-27 LAB — HEPATITIS PANEL, ACUTE
HCV Ab: NEGATIVE
Hep A IgM: NONREACTIVE

## 2013-05-27 LAB — LIPID PANEL: Cholesterol: 193 mg/dL (ref 0–200)

## 2013-05-27 NOTE — Telephone Encounter (Signed)
Message copied by Ronny Flurry on Tue May 27, 2013 10:58 AM ------      Message from: Penni Homans A      Created: Tue May 27, 2013  9:12 AM       Notify cholesterol off some but will discuss options at visit. INR is up slightly I believe she is on Warfarin 5 mg daily, have her take 2.5 on Saturday and stay at 5 every other day. Recheck INR in 2 weeks ------

## 2013-05-27 NOTE — Telephone Encounter (Signed)
Should pt take 2.5 on this Saturday only or both Saturdays until she has INR rechecked?

## 2013-05-27 NOTE — Telephone Encounter (Signed)
2.5 mg every Saturday until told otherwise

## 2013-05-30 NOTE — Telephone Encounter (Signed)
Pt informed and labs ordered 

## 2013-06-01 ENCOUNTER — Encounter: Payer: Self-pay | Admitting: Family Medicine

## 2013-06-01 NOTE — Assessment & Plan Note (Signed)
Refill given on Citalopram which has been helpful

## 2013-06-01 NOTE — Assessment & Plan Note (Signed)
INR mildly hi repeat in 1-2 weeks before making adjustments

## 2013-06-01 NOTE — Assessment & Plan Note (Signed)
Well treated on current does of Levothyroxine

## 2013-06-01 NOTE — Assessment & Plan Note (Signed)
Follows with [facility], Dr Broadus John

## 2013-06-01 NOTE — Assessment & Plan Note (Addendum)
Continue Fenofibrate and Zetia, avoid trans fats, increase exercise and add a krill oil cap. Consider trial of low dose Livalo if numbers worsen and patient agrees

## 2013-06-01 NOTE — Assessment & Plan Note (Signed)
No recent exacerbations.  

## 2013-06-01 NOTE — Progress Notes (Signed)
Patient ID: Emily Mitchell, female   DOB: [birth year], 67 y.o.   MRN: [id number] Emily Mitchell [id number] 09/18/1945 06/01/2013      Progress Note-Follow Up  Subjective  Chief Complaint  Chief Complaint  Patient presents with  . Follow-up    4 week    HPI  Patient is a 67 female who is in today for followup. She generally feeling well today. Has suffered a pulmonary embolism this past year but is doing well on Coumadin. Denies any chest pain, palpitations, shortness of breath, GI or GU concerns at this time. He is agreeing to a Tdap immunization today. Taking medications as prescribed.  Past Medical History  Diagnosis Date  . Carotid artery occlusion   . Hyperlipidemia   . Hypertension   . Depression with anxiety   . COPD (chronic obstructive pulmonary disease)   . Chicken pox as a child  . Mumps 30 yrs old  . GERD (gastroesophageal reflux disease)   . Asthma   . Arthritis   . Depression   . Hypothyroidism   . 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 Tremor   . CHF (congestive heart failure)   . Anxiety and depression 12/14/2008    Qualifier: Diagnosis of  By: [name] LPN, Almyra Free    . Other and unspecified hyperlipidemia 05/26/2013  . Pulmonary emboli 05/26/2013    Past Surgical History  Procedure Laterality Date  . Carotid endarterectomy Right 05/10/07    cea  . Knees replaced  2005 and 2011    both knees  . Surgery on neck  2010    carotid endarterectomy  . Wisdom tooth extraction    . Abdominal hysterectomy  1984  . Joint replacement      b/l knees    Family History  Problem Relation Age of Onset  . Stroke Mother     mini stroke  . Kidney disease Mother   . Heart failure Mother   . Hypertension Mother   . Diabetes Sister     type 2  . Hyperlipidemia Sister     History   Social History  . Marital Status: Legally Separated    Spouse Name: N/A    Number of Children: N/A  . Years of Education: N/A   Occupational History  . Not on  file.   Social History Main Topics  . Smoking status: Former Smoker -- 2.00 packs/day for 30 years    Types: Cigarettes    Quit date: 12/03/1994  . Smokeless tobacco: Never Used  . Alcohol Use: No  . Drug Use: No  . Sexual Activity: No   Other Topics Concern  . Not on file   Social History Narrative  . No narrative on file    Current Outpatient Prescriptions on File Prior to Visit  Medication Sig Dispense Refill  . albuterol (PROVENTIL HFA;VENTOLIN HFA) 108 (90 BASE) MCG/ACT inhaler Inhale 2 puffs into the lungs every 4 (four) hours as needed for wheezing.      . B Complex Vitamins (B-COMPLEX/B-12 TR PO) Take 1 tablet by mouth daily.       . calcium citrate-vitamin D (CITRACAL+D) 315-200 MG-UNIT per tablet Take 1 tablet by mouth daily.       . Cholecalciferol (D3 MAXIMUM STRENGTH) 5000 UNITS capsule Take 5,000 Units by mouth daily.      . clonazePAM (KLONOPIN) 1 MG tablet Take 1 tablet (1 mg total) by mouth 3 (three) times daily as  needed for anxiety.  30 tablet  0  . Fluticasone-Salmeterol (ADVAIR) 250-50 MCG/DOSE AEPB Inhale 1 puff into the lungs every 12 (twelve) hours.      Marland Kitchen levalbuterol (XOPENEX) 1.25 MG/0.5ML nebulizer solution Take 1 ampule by nebulization every 4 (four) hours as needed for wheezing.      Marland Kitchen levothyroxine (SYNTHROID, LEVOTHROID) 137 MCG tablet Take 137 mcg by mouth daily before breakfast.       . montelukast (SINGULAIR) 10 MG tablet Take 10 mg by mouth at bedtime.       . Multiple Vitamins-Minerals (PRESERVISION AREDS PO) Take 10 g by mouth every morning.      Marland Kitchen NEXIUM 40 MG capsule Take 40 mg by mouth 2 (two) times daily.       Marland Kitchen telmisartan-hydrochlorothiazide (MICARDIS HCT) 80-12.5 MG per tablet Take 1 tablet by mouth daily.       . traMADol (ULTRAM) 50 MG tablet Take 50 mg by mouth every 6 (six) hours as needed for pain.      Marland Kitchen warfarin (COUMADIN) 5 MG tablet Take 5 mg by mouth daily.      Marland Kitchen ZETIA 10 MG tablet Take 10 mg by mouth daily.       . Choline  Fenofibrate (FENOFIBRIC ACID) 135 MG CPDR Take 1 tablet by mouth daily.        No current facility-administered medications on file prior to visit.    Allergies  Allergen Reactions  . Niaspan [Niacin Er] Nausea And Vomiting and Swelling    Swelling in mouth  . Pantoprazole     Mouth sores  . Bupropion Other (See Comments)    Uncontrollable shakes  . Penicillins Hives  . Sulfonamide Derivatives Hives    Review of Systems  Review of Systems  Constitutional: Negative for fever and malaise/fatigue.  HENT: Negative for congestion.   Eyes: Negative for discharge.  Respiratory: Negative for shortness of breath.   Cardiovascular: Negative for chest pain, palpitations and leg swelling.  Gastrointestinal: Negative for nausea, abdominal pain and diarrhea.  Genitourinary: Negative for dysuria.  Musculoskeletal: Negative for falls.  Skin: Negative for rash.  Neurological: Negative for loss of consciousness and headaches.  Endo/Heme/Allergies: Negative for polydipsia.  Psychiatric/Behavioral: Negative for depression and suicidal ideas. The patient is not nervous/anxious and does not have insomnia.     Objective  BP 122/72  Pulse 83  Temp(Src) 97.7 F (36.5 C) (Oral)  Ht 5\' 4"  (1.626 m)  Wt 216 lb (97.977 kg)  BMI 37.06 kg/m2  SpO2 93%  Physical Exam  Physical Exam  Constitutional: She is oriented to person, place, and time and well-developed, well-nourished, and in no distress. No distress.  HENT:  Head: Normocephalic and atraumatic.  Eyes: Conjunctivae are normal.  Neck: Neck supple. No thyromegaly present.  Cardiovascular: Normal rate, regular rhythm and normal heart sounds.   No murmur heard. Pulmonary/Chest: Effort normal and breath sounds normal. She has no wheezes.  Abdominal: She exhibits no distension and no mass.  Musculoskeletal: She exhibits no edema.  Lymphadenopathy:    She has no cervical adenopathy.  Neurological: She is alert and oriented to person,  place, and time.  Skin: Skin is warm and dry. No rash noted. She is not diaphoretic.  Psychiatric: Memory, affect and judgment normal.    Lab Results  Component Value Date   TSH 0.662 04/29/2013   Lab Results  Component Value Date   WBC 8.1 04/29/2013   HGB 13.0 04/29/2013   HCT 38.3 04/29/2013  MCV 87.4 04/29/2013   PLT 268 04/29/2013   Lab Results  Component Value Date   CREATININE 2.26* 04/29/2013   BUN 33* 04/29/2013   NA 141 04/29/2013   K 4.5 04/29/2013   CL 102 04/29/2013   CO2 29 04/29/2013   Lab Results  Component Value Date   ALT 20 05/26/2013   AST 21 05/26/2013   ALKPHOS 34* 05/26/2013   BILITOT 0.5 05/26/2013   Lab Results  Component Value Date   CHOL 193 05/26/2013   Lab Results  Component Value Date   HDL 48 05/26/2013   Lab Results  Component Value Date   LDLCALC 100* 05/26/2013   Lab Results  Component Value Date   TRIG 227* 05/26/2013   Lab Results  Component Value Date   CHOLHDL 4.0 05/26/2013     Assessment & Plan  Other and unspecified hyperlipidemia Continue Fenofibrate and Zetia, avoid trans fats, increase exercise and add a krill oil cap. Consider trial of low dose Livalo if numbers worsen and patient agrees  SLEEP APNEA Previous Doctor, Dr Glade Lloyd stopped her CPAP unclear why, may need repeat sleep study in future  Chronic kidney disease Follows with [facility], Dr Broadus John  Hypothyroidism Well treated on current does of Levothyroxine  CHF (congestive heart failure) No recent exacerbations  Pulmonary emboli INR mildly hi repeat in 1-2 weeks before making adjustments  Anxiety and depression Refill given on Citalopram which has been helpful

## 2013-06-01 NOTE — Assessment & Plan Note (Signed)
Previous Doctor, Dr Glade Lloyd stopped her CPAP unclear why, may need repeat sleep study in future

## 2013-06-11 ENCOUNTER — Encounter: Payer: Self-pay | Admitting: Cardiology

## 2013-06-11 ENCOUNTER — Ambulatory Visit (INDEPENDENT_AMBULATORY_CARE_PROVIDER_SITE_OTHER): Payer: Medicare Other | Admitting: Cardiology

## 2013-06-11 VITALS — BP 136/70 | HR 72 | Wt 216.0 lb

## 2013-06-11 DIAGNOSIS — R079 Chest pain, unspecified: Secondary | ICD-10-CM

## 2013-06-11 DIAGNOSIS — I509 Heart failure, unspecified: Secondary | ICD-10-CM

## 2013-06-11 DIAGNOSIS — I2699 Other pulmonary embolism without acute cor pulmonale: Secondary | ICD-10-CM

## 2013-06-11 NOTE — Assessment & Plan Note (Signed)
Continue Coumadin. 

## 2013-06-11 NOTE — Assessment & Plan Note (Signed)
Symptoms are atypical but history of vascular disease. Plan Myoview for risk stratification. Her mobility is limited and I do not think he will be able to achieve target heart rate on a treadmill.

## 2013-06-11 NOTE — Assessment & Plan Note (Signed)
Patient with history of cerebrovascular disease followed by vascular surgery.no aspirin given need for Coumadin. Apparently had intolerance to Lipitor in the past. She would benefit from a statin long-term vascular discuss this with primary care as she is not clear on her previous intolerances to medications.

## 2013-06-11 NOTE — Patient Instructions (Addendum)
Your physician has requested that you have a lexiscan myoview. For further information please visit [email or web address]. Please follow instruction sheet, as given.   Your physician wants you to follow-up in: 6 months with Dr. Stanford Breed. You will receive a reminder letter in the mail two months in advance. If you don't receive a letter, please call our office to schedule the follow-up appointment.  Your physician recommends that you continue on your current medications as directed. Please refer to the Current Medication list given to you today.   Filled out a release form today to get records from [facility] for admit records from July

## 2013-06-11 NOTE — Assessment & Plan Note (Signed)
Patient not volume overloaded on examination. Continue present medications. I will obtain records from [facility] concerning diagnosis and previous echocardiograms.

## 2013-06-11 NOTE — Progress Notes (Signed)
HPI: 67 year old female for evaluation of congestive heart failure. Previous right carotid endarterectomy. Carotid Dopplers in May of 2014 showed a 40-59% left stenosis. There was occlusion of the right carotid. Followed by vascular surgery. Patient has chronic dyspnea on exertion from COPD. However she developed severe dyspnea and fatigue in July of this year. She was admitted to [facility] and diagnosed with a pulmonary embolus. She was also told she had congestive heart failure. Records are not available. She has been treated with Coumadin and is much better. She still has dyspnea on exertion but no orthopnea or PND. Occasional mild pedal edema towards the end of the day. This resolves overnight. Occasional pain in her left chest that last minutes. Not exertional no radiation. She has not had exertional chest pain. Cardiology is asked to evaluate her for diagnosis of congestive heart failure.  Current Outpatient Prescriptions  Medication Sig Dispense Refill  . albuterol (PROVENTIL HFA;VENTOLIN HFA) 108 (90 BASE) MCG/ACT inhaler Inhale 2 puffs into the lungs every 4 (four) hours as needed for wheezing.      . B Complex Vitamins (B-COMPLEX/B-12 TR PO) Take 1 tablet by mouth daily.       . calcium citrate-vitamin D (CITRACAL+D) 315-200 MG-UNIT per tablet Take 1 tablet by mouth daily.       . Cholecalciferol (D3 MAXIMUM STRENGTH) 5000 UNITS capsule Take 5,000 Units by mouth daily.      . Choline Fenofibrate (FENOFIBRIC ACID) 135 MG CPDR Take 1 tablet by mouth daily.       . citalopram (CELEXA) 40 MG tablet Take 1 tablet (40 mg total) by mouth daily.  90 tablet  1  . clonazePAM (KLONOPIN) 1 MG tablet Take 1 tablet (1 mg total) by mouth 3 (three) times daily as needed for anxiety.  30 tablet  0  . Fluticasone-Salmeterol (ADVAIR) 250-50 MCG/DOSE AEPB Inhale 1 puff into the lungs every 12 (twelve) hours.      Marland Kitchen levalbuterol (XOPENEX) 1.25 MG/0.5ML nebulizer solution Take 1 ampule by  nebulization every 4 (four) hours as needed for wheezing.      Marland Kitchen levothyroxine (SYNTHROID, LEVOTHROID) 137 MCG tablet Take 137 mcg by mouth daily before breakfast.       . montelukast (SINGULAIR) 10 MG tablet Take 10 mg by mouth at bedtime.       . Multiple Vitamins-Minerals (PRESERVISION AREDS PO) Take 10 g by mouth every morning.      Marland Kitchen NEXIUM 40 MG capsule Take 40 mg by mouth 2 (two) times daily.       Marland Kitchen telmisartan-hydrochlorothiazide (MICARDIS HCT) 80-12.5 MG per tablet Take 1 tablet by mouth daily.       . traMADol (ULTRAM) 50 MG tablet Take 50 mg by mouth every 6 (six) hours as needed for pain.      Marland Kitchen warfarin (COUMADIN) 5 MG tablet Take 5 mg by mouth daily.      Marland Kitchen ZETIA 10 MG tablet Take 10 mg by mouth daily.        No current facility-administered medications for this visit.    Allergies  Allergen Reactions  . Niaspan [Niacin Er] Nausea And Vomiting and Swelling    Swelling in mouth  . Pantoprazole     Mouth sores  . Bupropion Other (See Comments)    Uncontrollable shakes  . Penicillins Hives  . Sulfonamide Derivatives Hives    Past Medical History  Diagnosis Date  . Carotid artery occlusion   . Hyperlipidemia   .  Hypertension   . COPD (chronic obstructive pulmonary disease)   . Chicken pox as a child  . Mumps 68 yrs old  . GERD (gastroesophageal reflux disease)   . Asthma   . Arthritis   . Hypothyroidism   . Chronic kidney disease     Bright's Disease at age 74   . Tremor   . CHF (congestive heart failure)   . Anxiety and depression 12/14/2008    Qualifier: Diagnosis of  By: [name] LPN, Almyra Free    . Pulmonary emboli 05/26/2013    Past Surgical History  Procedure Laterality Date  . Carotid endarterectomy Right 05/10/07    cea  . Knees replaced  2005 and 2011    both knees  . Wisdom tooth extraction    . Abdominal hysterectomy  1984    History   Social History  . Marital Status: Divorced    Spouse Name: N/A    Number of Children: 2  . Years of  Education: N/A   Occupational History  . Not on file.   Social History Main Topics  . Smoking status: Former Smoker -- 2.00 packs/day for 30 years    Types: Cigarettes    Quit date: 12/03/1994  . Smokeless tobacco: Never Used  . Alcohol Use: No  . Drug Use: No  . Sexual Activity: No   Other Topics Concern  . Not on file   Social History Narrative  . No narrative on file    Family History  Problem Relation Age of Onset  . Stroke Mother     mini stroke  . Kidney disease Mother   . Heart failure Mother   . Hypertension Mother   . Diabetes Sister     type 2  . Hyperlipidemia Sister     ROS: no fevers or chills, productive cough, hemoptysis, dysphasia, odynophagia, melena, hematochezia, dysuria, hematuria, rash, seizure activity, orthopnea, PND, pedal edema, claudication. Remaining systems are negative.  Physical Exam:   Blood pressure 136/70, pulse 72, weight 216 lb (97.977 kg).  General:  Well developed/obese in NAD Skin warm/dry Patient not depressed No peripheral clubbing Back-normal HEENT-normal/normal eyelids Neck supple/normal carotid upstroke bilaterally; no bruits; no JVD; no thyromegaly chest - CTA/ normal expansion CV - RRR/normal S1 and S2; no murmurs, rubs or gallops;  PMI nondisplaced Abdomen -NT/ND, no HSM, no mass, + bowel sounds, no bruit 2+ femoral pulses, no bruits Ext-no edema, chords, 2+ DP Neuro-grossly nonfocal  ECG sinus rhythm at a rate of 72. First-degree AV block. Low voltage.

## 2013-06-13 ENCOUNTER — Telehealth: Payer: Self-pay

## 2013-06-13 NOTE — Telephone Encounter (Signed)
Message copied by Varney Daily on Fri Jun 13, 2013 10:47 PM ------      Message from: Penni Homans A      Created: Sun Jun 01, 2013  9:46 AM       Her INR was up slightly, I just want to make sure we are set up to recheck it in next week or 2 and clarify with her if she wants [place] to keep checking it or does she want to go to coumadin clinic ------

## 2013-06-13 NOTE — Telephone Encounter (Signed)
Inform pt

## 2013-06-16 ENCOUNTER — Telehealth: Payer: Self-pay

## 2013-06-16 ENCOUNTER — Telehealth: Payer: Self-pay | Admitting: Cardiology

## 2013-06-16 DIAGNOSIS — I2699 Other pulmonary embolism without acute cor pulmonale: Secondary | ICD-10-CM

## 2013-06-16 NOTE — Telephone Encounter (Signed)
Lab order placed.

## 2013-06-16 NOTE — Telephone Encounter (Signed)
Pt informed and stated she sees MD on 11-24 so she will do it then.   Order places

## 2013-06-16 NOTE — Telephone Encounter (Signed)
ROI faxed to [facility] at [phone]

## 2013-06-17 ENCOUNTER — Telehealth: Payer: Self-pay | Admitting: Cardiology

## 2013-06-17 NOTE — Telephone Encounter (Signed)
Records rec From [facility] gave to [facility]

## 2013-06-23 ENCOUNTER — Ambulatory Visit (INDEPENDENT_AMBULATORY_CARE_PROVIDER_SITE_OTHER): Payer: Medicare Other | Admitting: Family Medicine

## 2013-06-23 ENCOUNTER — Encounter: Payer: Self-pay | Admitting: Family Medicine

## 2013-06-23 VITALS — BP 100/70 | HR 90 | Temp 97.9°F | Ht 64.0 in | Wt 218.0 lb

## 2013-06-23 DIAGNOSIS — R0789 Other chest pain: Secondary | ICD-10-CM

## 2013-06-23 DIAGNOSIS — I2699 Other pulmonary embolism without acute cor pulmonale: Secondary | ICD-10-CM

## 2013-06-23 DIAGNOSIS — E039 Hypothyroidism, unspecified: Secondary | ICD-10-CM

## 2013-06-23 DIAGNOSIS — N189 Chronic kidney disease, unspecified: Secondary | ICD-10-CM

## 2013-06-23 DIAGNOSIS — K219 Gastro-esophageal reflux disease without esophagitis: Secondary | ICD-10-CM

## 2013-06-23 DIAGNOSIS — G4730 Sleep apnea, unspecified: Secondary | ICD-10-CM

## 2013-06-23 DIAGNOSIS — R079 Chest pain, unspecified: Secondary | ICD-10-CM

## 2013-06-23 DIAGNOSIS — K5900 Constipation, unspecified: Secondary | ICD-10-CM

## 2013-06-23 LAB — PROTIME-INR
INR: 3.36 — ABNORMAL HIGH (ref <1.50)
Prothrombin Time: 32.4 seconds — ABNORMAL HIGH (ref 11.6–15.2)

## 2013-06-23 MED ORDER — NITROGLYCERIN 0.4 MG SL SUBL: 0.4000 mg | SUBLINGUAL_TABLET | SUBLINGUAL | Status: DC | PRN

## 2013-06-23 NOTE — Patient Instructions (Addendum)
Calcium/magnesium/zinc tab daily Biotin daily for hair  Nitroglycerin sublingual tablets What is this medicine? NITROGLYCERIN (nye troe GLI ser in) is a type of vasodilator. It relaxes blood vessels, increasing the blood and oxygen supply to your heart. This medicine is used to relieve chest pain caused by angina. It is also used to prevent chest pain before activities like climbing stairs, going outdoors in cold weather, or sexual activity. This medicine may be used for other purposes; ask your health care provider or pharmacist if you have questions. COMMON BRAND NAME(S): Nitroquick, Nitrostat, Nitrotab What should I tell my health care provider before I take this medicine? They need to know if you have any of these conditions: -anemia -head injury, recent stroke, or bleeding in the brain -liver disease -previous heart attack -an unusual or allergic reaction to nitroglycerin, other medicines, foods, dyes, or preservatives -pregnant or trying to get pregnant -breast-feeding How should I use this medicine? Take this medicine by mouth as needed. At the first sign of an angina attack (chest pain or tightness) place one tablet under your tongue. You can also take this medicine 5 to 10 minutes before an event likely to produce chest pain. Follow the directions on the prescription label. Let the tablet dissolve under the tongue. Do not swallow whole. Replace the dose if you accidentally swallow it. It will help if your mouth is not dry. Saliva around the tablet will help it to dissolve more quickly. Do not eat or drink, smoke or chew tobacco while a tablet is dissolving. If you are not better within 5 minutes after taking ONE dose of nitroglycerin, call 9-1-1 immediately to seek emergency medical care. Do not take more than 3 nitroglycerin tablets over 15 minutes. If you take this medicine often to relieve symptoms of angina, your doctor or health care professional may provide you with different  instructions to manage your symptoms. If symptoms do not go away after following these instructions, it is important to call 9-1-1 immediately. Do not take more than 3 nitroglycerin tablets over 15 minutes. Talk to your pediatrician regarding the use of this medicine in children. Special care may be needed. Overdosage: If you think you have taken too much of this medicine contact a poison control center or emergency room at once. NOTE: This medicine is only for you. Do not share this medicine with others. What if I miss a dose? This does not apply. This medicine is only used as needed. What may interact with this medicine? Do not take this medicine with any of the following medications: -certain migraine medicines like ergotamine and dihydroergotamine (DHE) -medicines used to treat erectile dysfunction like sildenafil, tadalafil, and vardenafil This medicine may also interact with the following medications: -alteplase -aspirin -heparin -medicines for high blood pressure -medicines for mental depression -other medicines used to treat angina -phenothiazines like chlorpromazine, mesoridazine, prochlorperazine, thioridazine This list may not describe all possible interactions. Give your health care provider a list of all the medicines, herbs, non-prescription drugs, or dietary supplements you use. Also tell them if you smoke, drink alcohol, or use illegal drugs. Some items may interact with your medicine. What should I watch for while using this medicine? Tell your doctor or health care professional if you feel your medicine is no longer working. Keep this medicine with you at all times. Sit or lie down when you take your medicine to prevent falling if you feel dizzy or faint after using it. Try to remain calm. This will help you  to feel better faster. If you feel dizzy, take several deep breaths and lie down with your feet propped up, or bend forward with your head resting between your knees. You  may get drowsy or dizzy. Do not drive, use machinery, or do anything that needs mental alertness until you know how this drug affects you. Do not stand or sit up quickly, especially if you are an older patient. This reduces the risk of dizzy or fainting spells. Alcohol can make you more drowsy and dizzy. Avoid alcoholic drinks. Do not treat yourself for coughs, colds, or pain while you are taking this medicine without asking your doctor or health care professional for advice. Some ingredients may increase your blood pressure. What side effects may I notice from receiving this medicine? Side effects that you should report to your doctor or health care professional as soon as possible: -blurred vision -dry mouth -skin rash -sweating -the feeling of extreme pressure in the head -unusually weak or tired Side effects that usually do not require medical attention (report to your doctor or health care professional if they continue or are bothersome): -flushing of the face or neck -headache -irregular heartbeat, palpitations -nausea, vomiting This list may not describe all possible side effects. Call your doctor for medical advice about side effects. You may report side effects to FDA at 1-800-FDA-1088. Where should I keep my medicine? Keep out of the reach of children. Store at room temperature between 20 and 25 degrees C (68 and 77 degrees F). Store in [employer]. Protect from light and moisture. Keep tightly closed. Throw away any unused medicine after the expiration date. NOTE: This sheet is a summary. It may not cover all possible information. If you have questions about this medicine, talk to your doctor, pharmacist, or health care provider.  2014, Elsevier/Gold Standard. (2008-02-07 17:16:24)

## 2013-06-23 NOTE — Progress Notes (Signed)
Pre visit review using our clinic review tool, if applicable. No additional management support is needed unless otherwise documented below in the visit note. 

## 2013-06-24 ENCOUNTER — Encounter: Payer: Self-pay | Admitting: Family Medicine

## 2013-06-29 ENCOUNTER — Encounter: Payer: Self-pay | Admitting: Family Medicine

## 2013-06-29 NOTE — Assessment & Plan Note (Signed)
Has appt with cardiology for next month. CP is intermittent and happens roughly weekly, no associated symptoms and resolves with rest. Is given NTG to use prn if pain occurs and does not resolve. Seek care if NTG does not help

## 2013-06-29 NOTE — Assessment & Plan Note (Signed)
Well controlled on Nexium bid, needs to minimize offending foods. TUMS qhs

## 2013-06-29 NOTE — Progress Notes (Signed)
Patient ID: Emily Mitchell, female   DOB: 11-03-1945, 67 y.o.   MRN: [id number] Emily Mitchell [id number] 04/23/1946 06/29/2013      Progress Note-Follow Up  Subjective  Chief Complaint  Chief Complaint  Patient presents with  . Follow-up    4 week    HPI  67 year old female who is in today for followup. She is accompanied here past medical history. She has a history of sleep apnea is not presently using CPAP. Does have restless sleep and fatigue. Has been told by 2 different pulmonologists that she needed CPAP and did not. She also struggles with intermittent chest pain which she's had for quite some time. Has roughly one episode a week of some substernal chest pain resolves spontaneously and has no associated symptoms. She has an appointment with cardiology next month for a stress test pain is not worsening. No recent illness. No congestion, headache, GU complaints. Does have heartburn but is generally well-controlled with Nexium.  Past Medical History  Diagnosis Date  . Carotid artery occlusion   . Hyperlipidemia   . Hypertension   . COPD (chronic obstructive pulmonary disease)   . Chicken pox as a child  . Mumps 76 yrs old  . GERD (gastroesophageal reflux disease)   . Asthma   . Arthritis   . Hypothyroidism   . Chronic kidney disease     Bright's Disease at age 3   . Tremor   . CHF (congestive heart failure)   . Anxiety and depression 12/14/2008    Qualifier: Diagnosis of  By: [name] LPN, Almyra Free    . Pulmonary emboli 05/26/2013    Past Surgical History  Procedure Laterality Date  . Carotid endarterectomy Right 05/10/07    cea  . Knees replaced  2005 and 2011    both knees  . Wisdom tooth extraction    . Abdominal hysterectomy  1984    Family History  Problem Relation Age of Onset  . Stroke Mother     mini stroke  . Kidney disease Mother   . Heart failure Mother   . Hypertension Mother   . Diabetes Sister     type 2  . Hyperlipidemia Sister     History    Social History  . Marital Status: Divorced    Spouse Name: N/A    Number of Children: 2  . Years of Education: N/A   Occupational History  . Not on file.   Social History Main Topics  . Smoking status: Former Smoker -- 2.00 packs/day for 30 years    Types: Cigarettes    Quit date: 12/03/1994  . Smokeless tobacco: Never Used  . Alcohol Use: No  . Drug Use: No  . Sexual Activity: No   Other Topics Concern  . Not on file   Social History Narrative  . No narrative on file    Current Outpatient Prescriptions on File Prior to Visit  Medication Sig Dispense Refill  . albuterol (PROVENTIL HFA;VENTOLIN HFA) 108 (90 BASE) MCG/ACT inhaler Inhale 2 puffs into the lungs every 4 (four) hours as needed for wheezing.      . B Complex Vitamins (B-COMPLEX/B-12 TR PO) Take 1 tablet by mouth daily.       . calcium citrate-vitamin D (CITRACAL+D) 315-200 MG-UNIT per tablet Take 1 tablet by mouth daily.       . Cholecalciferol (D3 MAXIMUM STRENGTH) 5000 UNITS capsule Take 5,000 Units by mouth daily.      . Choline Fenofibrate (FENOFIBRIC ACID)  135 MG CPDR Take 1 tablet by mouth daily.       . citalopram (CELEXA) 40 MG tablet Take 1 tablet (40 mg total) by mouth daily.  90 tablet  1  . clonazePAM (KLONOPIN) 1 MG tablet Take 1 tablet (1 mg total) by mouth 3 (three) times daily as needed for anxiety.  30 tablet  0  . Fluticasone-Salmeterol (ADVAIR) 250-50 MCG/DOSE AEPB Inhale 1 puff into the lungs every 12 (twelve) hours.      Marland Kitchen levalbuterol (XOPENEX) 1.25 MG/0.5ML nebulizer solution Take 1 ampule by nebulization every 4 (four) hours as needed for wheezing.      Marland Kitchen levothyroxine (SYNTHROID, LEVOTHROID) 137 MCG tablet Take 137 mcg by mouth daily before breakfast.       . montelukast (SINGULAIR) 10 MG tablet Take 10 mg by mouth at bedtime.       . Multiple Vitamins-Minerals (PRESERVISION AREDS PO) Take 10 g by mouth every morning.      Marland Kitchen NEXIUM 40 MG capsule Take 40 mg by mouth 2 (two) times daily.        Marland Kitchen telmisartan-hydrochlorothiazide (MICARDIS HCT) 80-12.5 MG per tablet Take 1 tablet by mouth daily.       . traMADol (ULTRAM) 50 MG tablet Take 50 mg by mouth every 6 (six) hours as needed for pain.      Marland Kitchen warfarin (COUMADIN) 5 MG tablet Take 5 mg by mouth daily.      Marland Kitchen ZETIA 10 MG tablet Take 10 mg by mouth daily.        No current facility-administered medications on file prior to visit.    Allergies  Allergen Reactions  . Niaspan [Niacin Er] Nausea And Vomiting and Swelling    Swelling in mouth  . Pantoprazole     Mouth sores  . Bupropion Other (See Comments)    Uncontrollable shakes  . Penicillins Hives  . Sulfonamide Derivatives Hives    Review of Systems  Review of Systems  Constitutional: Positive for malaise/fatigue. Negative for fever.  HENT: Negative for congestion.   Eyes: Negative for discharge.  Respiratory: Positive for shortness of breath.   Cardiovascular: Positive for chest pain. Negative for palpitations and leg swelling.  Gastrointestinal: Positive for heartburn. Negative for nausea, abdominal pain and diarrhea.  Genitourinary: Negative for dysuria.  Musculoskeletal: Negative for falls.  Skin: Negative for rash.  Neurological: Negative for loss of consciousness and headaches.  Endo/Heme/Allergies: Negative for polydipsia.  Psychiatric/Behavioral: Negative for depression and suicidal ideas. The patient is not nervous/anxious and does not have insomnia.     Objective  BP 100/70  Pulse 90  Temp(Src) 97.9 F (36.6 C) (Oral)  Ht 5\' 4"  (1.626 m)  Wt 218 lb 0.6 oz (98.902 kg)  BMI 37.41 kg/m2  SpO2 90%  Physical Exam  Physical Exam  Constitutional: She is oriented to person, place, and time and well-developed, well-nourished, and in no distress. No distress.  HENT:  Head: Normocephalic and atraumatic.  Eyes: Conjunctivae are normal.  Neck: Neck supple. No thyromegaly present.  Cardiovascular: Normal rate, regular rhythm and normal heart sounds.    Pulmonary/Chest: Effort normal and breath sounds normal. She has no wheezes.  Abdominal: She exhibits no distension and no mass.  Musculoskeletal: She exhibits no edema.  Lymphadenopathy:    She has no cervical adenopathy.  Neurological: She is alert and oriented to person, place, and time.  Skin: Skin is warm and dry. No rash noted. She is not diaphoretic.  Psychiatric: Memory, affect and  judgment normal.    Lab Results  Component Value Date   TSH 0.662 04/29/2013   Lab Results  Component Value Date   WBC 8.1 04/29/2013   HGB 13.0 04/29/2013   HCT 38.3 04/29/2013   MCV 87.4 04/29/2013   PLT 268 04/29/2013   Lab Results  Component Value Date   CREATININE 2.26* 04/29/2013   BUN 33* 04/29/2013   NA 141 04/29/2013   K 4.5 04/29/2013   CL 102 04/29/2013   CO2 29 04/29/2013   Lab Results  Component Value Date   ALT 20 05/26/2013   AST 21 05/26/2013   ALKPHOS 34* 05/26/2013   BILITOT 0.5 05/26/2013   Lab Results  Component Value Date   CHOL 193 05/26/2013   Lab Results  Component Value Date   HDL 48 05/26/2013   Lab Results  Component Value Date   LDLCALC 100* 05/26/2013   Lab Results  Component Value Date   TRIG 227* 05/26/2013   Lab Results  Component Value Date   CHOLHDL 4.0 05/26/2013     Assessment & Plan  GERD Well controlled on Nexium bid, needs to minimize offending foods. TUMS qhs  CONSTIPATION Encouraged adequate hydration, fiber and probiotics.  Hypothyroidism Well treated on Levothyroxine  Pulmonary emboli Tolerating coumadin, over treated will continue coumadin but drop one dose weekly  SLEEP APNEA Patient is struggling with restless sleep and a h/o sleep apnea, not presently using CPAP per patient has had 2 conflicting sleep studies one said he needed cpap and one said she did not. Has previously seen Dr Glade Lloyd at [facility] and he said she did not need it. Dr Luan Pulling said she did. Will request records and have pulmonology evaluate need  Chest  pain Has appt with cardiology for next month. CP is intermittent and happens roughly weekly, no associated symptoms and resolves with rest. Is given NTG to use prn if pain occurs and does not resolve. Seek care if NTG does not help

## 2013-06-29 NOTE — Assessment & Plan Note (Signed)
Well treated on Levothyroxine

## 2013-06-29 NOTE — Assessment & Plan Note (Signed)
Tolerating coumadin, over treated will continue coumadin but drop one dose weekly

## 2013-06-29 NOTE — Assessment & Plan Note (Signed)
Encouraged adequate hydration, fiber and probiotics.

## 2013-06-29 NOTE — Assessment & Plan Note (Signed)
Patient is struggling with restless sleep and a h/o sleep apnea, not presently using CPAP per patient has had 2 conflicting sleep studies one said he needed cpap and one said she did not. Has previously seen Dr Glade Lloyd at [facility] and he said she did not need it. Dr Luan Pulling said she did. Will request records and have pulmonology evaluate need

## 2013-07-03 ENCOUNTER — Ambulatory Visit (HOSPITAL_COMMUNITY): Payer: Medicare Other | Attending: Cardiology | Admitting: Radiology

## 2013-07-03 VITALS — BP 147/71 | HR 59 | Ht 64.0 in | Wt 208.0 lb

## 2013-07-03 DIAGNOSIS — R42 Dizziness and giddiness: Secondary | ICD-10-CM | POA: Insufficient documentation

## 2013-07-03 DIAGNOSIS — R0602 Shortness of breath: Secondary | ICD-10-CM | POA: Insufficient documentation

## 2013-07-03 DIAGNOSIS — R002 Palpitations: Secondary | ICD-10-CM | POA: Insufficient documentation

## 2013-07-03 DIAGNOSIS — R0789 Other chest pain: Secondary | ICD-10-CM | POA: Insufficient documentation

## 2013-07-03 DIAGNOSIS — I509 Heart failure, unspecified: Secondary | ICD-10-CM | POA: Insufficient documentation

## 2013-07-03 DIAGNOSIS — J4489 Other specified chronic obstructive pulmonary disease: Secondary | ICD-10-CM | POA: Insufficient documentation

## 2013-07-03 DIAGNOSIS — Z8249 Family history of ischemic heart disease and other diseases of the circulatory system: Secondary | ICD-10-CM | POA: Insufficient documentation

## 2013-07-03 DIAGNOSIS — Z87891 Personal history of nicotine dependence: Secondary | ICD-10-CM | POA: Insufficient documentation

## 2013-07-03 DIAGNOSIS — R079 Chest pain, unspecified: Secondary | ICD-10-CM

## 2013-07-03 DIAGNOSIS — I10 Essential (primary) hypertension: Secondary | ICD-10-CM | POA: Insufficient documentation

## 2013-07-03 DIAGNOSIS — J449 Chronic obstructive pulmonary disease, unspecified: Secondary | ICD-10-CM | POA: Insufficient documentation

## 2013-07-03 DIAGNOSIS — E785 Hyperlipidemia, unspecified: Secondary | ICD-10-CM | POA: Insufficient documentation

## 2013-07-03 DIAGNOSIS — I517 Cardiomegaly: Secondary | ICD-10-CM | POA: Insufficient documentation

## 2013-07-03 MED ORDER — TECHNETIUM TC 99M SESTAMIBI GENERIC - CARDIOLITE
11.0000 | Freq: Once | INTRAVENOUS | Status: AC | PRN
  Administered 2013-07-03: 11 via INTRAVENOUS

## 2013-07-03 MED ORDER — REGADENOSON 0.4 MG/5ML IV SOLN
0.4000 mg | Freq: Once | INTRAVENOUS | Status: AC
  Administered 2013-07-03: 0.4 mg via INTRAVENOUS

## 2013-07-03 MED ORDER — TECHNETIUM TC 99M SESTAMIBI GENERIC - CARDIOLITE
33.0000 | Freq: Once | INTRAVENOUS | Status: AC | PRN
  Administered 2013-07-03: 33 via INTRAVENOUS

## 2013-07-03 NOTE — Progress Notes (Signed)
[facility] [address] [place], [place] [address] [id number]    Cardiology Nuclear Med Study  Emily Mitchell is a 67 y.o. female     MRN : [id number]     DOB: Feb 16, 1946  Procedure Date: 07/03/2013  Nuclear Med Background Indication for Stress Test:  Evaluation for Ischemia; post hospital 7/14 for PE> Cardiomegaly & CHF diagnosed at that time per pt. History:  Asthma, COPD; Cath>no intervention per pt; cardiac CT and MRI patient not sure when; MPI ~74yrs ago per pt. Cardiac Risk Factors: Family History - CAD, History of Smoking, Hypertension and Lipids  Symptoms:  Chest Pain (last date of chest discomfort was yesterday), Dizziness, Palpitations and SOB   Nuclear Pre-Procedure Caffeine/Decaff Intake:  None > 12 hours NPO After: 10:00pm   Lungs:  clear O2 Sat: 93% on room air. IV 0.9% NS with Angio Cath:  22g  IV Site: R Antecubital  IV Started by:  Nyra Market  Chest Size (in):  42 Cup Size: C  Height: 5\' 4"  (1.626 m)  Weight:  208 lb (94.348 kg)  BMI:  Body mass index is 35.69 kg/(m^2). Tech Comments:  n/a    Nuclear Med Study 1 or 2 day study: 1 day  Stress Test Type:  Lexiscan  Reading MD: Daiton Cowles,MD  Order Authorizing Provider:  Crenshaw,Brian,MD  Resting Radionuclide: Technetium 90m Sestamibi  Resting Radionuclide Dose: 11.0 mCi   Stress Radionuclide:  Technetium 62m Sestamibi  Stress Radionuclide Dose: 33.0 mCi           Stress Protocol Rest HR: 59 Stress HR: 86  Rest BP: 147/71 Stress BP: 135/47  Exercise Time (min): n/a METS: n/a           Dose of Adenosine (mg):  n/a Dose of Lexiscan: 0.4 mg  Dose of Atropine (mg): n/a Dose of Dobutamine: n/a mcg/kg/min (at max HR)  Stress Test Technologist: Glade Lloyd, BS-ES  Nuclear Technologist:  Vedia Pereyra, CNMT     Rest Procedure:  Myocardial perfusion imaging was performed at rest 45 minutes following the intravenous administration of Technetium 44m  Sestamibi. Rest ECG: NSR - Normal EKG  Stress Procedure:  The patient received IV Lexiscan 0.4 mg over 15-seconds.  Technetium 67m Sestamibi injected at 30-seconds.  Quantitative spect images were obtained after a 45 minute delay.  During the infusion of Lexiscan, the patient complained of SOB, headache, lightheadedness and nausea.  All symptoms resolved with the exception of the headache.  Stress ECG: No significant change from baseline ECG  QPS Raw Data Images:  Normal; no motion artifact; normal heart/lung ratio. Stress Images:  There is decreased uptake in the mid anterior segment.  Rest Images:  There is decreased uptake in the mid anterior segment.  Subtraction (SDS):  No evidence of ischemia. Transient Ischemic Dilatation (Normal <1.22):  1.07 Lung/Heart Ratio (Normal <0.45):  0.28  Quantitative Gated Spect Images QGS EDV:  75 ml QGS ESV:  20 ml  Impression Exercise Capacity:  Lexiscan with no exercise. BP Response:  Normal blood pressure response. Clinical Symptoms:  No significant symptoms noted. ECG Impression:  No significant ST segment change suggestive of ischemia. Comparison with Prior Nuclear Study: No images to compare  Overall Impression:  Low risk stress nuclear study with a small nonreversible perfusion defect in the mid anterior wall that is most probably a breast attenuation..  LV Ejection Fraction: 74%.  LV Wall Motion:  NL LV Function; NL Wall Motion  Emily Mitchell,  H 07/03/2013

## 2013-07-07 ENCOUNTER — Ambulatory Visit (INDEPENDENT_AMBULATORY_CARE_PROVIDER_SITE_OTHER): Payer: Medicare Other | Admitting: Pulmonary Disease

## 2013-07-07 ENCOUNTER — Encounter: Payer: Self-pay | Admitting: Pulmonary Disease

## 2013-07-07 VITALS — BP 132/80 | HR 77 | Temp 97.5°F | Ht 64.0 in | Wt 217.0 lb

## 2013-07-07 DIAGNOSIS — G4730 Sleep apnea, unspecified: Secondary | ICD-10-CM

## 2013-07-07 NOTE — Progress Notes (Signed)
Subjective:    Patient ID: Emily Mitchell, female    DOB: 1946-04-03, 67 y.o.   MRN: [id number]  HPI 67 y.o referred for evaluation of obstructive sleep apnea  Patient is struggling with restless sleep and a h/o sleep apnea, not presently using CPAP per patient has had 2 conflicting sleep studies one said he needed cpap and one said she did not. Underwent PSG at [facility] 08/2011 -AHI 4/h, TST 319 mins,supine  -  and Dr Jarome Lamas said she did not need CPAP. Dr Luan Pulling said she did   Copd dx 2004. Pt saw [facility] in the past.  She was diagnosed with PE in 01/2013, had PE dx. At [facility] - on coumadin  Placed on O2 during sleep ESS 11 Last dose of klonopin at bedtime around MD , watching TV late,left side x 1 pillow, knees hurt, 3 nocturnal awakenings - Wakes up @ 10A feeing tired, no dryness or headaches There is no history suggestive of cataplexy, sleep paralysis or parasomnias     Past Medical History  Diagnosis Date  . Carotid artery occlusion   . Hyperlipidemia   . Hypertension   . COPD (chronic obstructive pulmonary disease)   . Chicken pox as a child  . Mumps 28 yrs old  . GERD (gastroesophageal reflux disease)   . Asthma   . Arthritis   . Hypothyroidism   . Chronic kidney disease     Bright's Disease at age 51   . Tremor   . CHF (congestive heart failure)   . Anxiety and depression 12/14/2008    Qualifier: Diagnosis of  By: [name] LPN, Almyra Free    . Pulmonary emboli 05/26/2013    Past Surgical History  Procedure Laterality Date  . Carotid endarterectomy Right 05/10/07    cea  . Knees replaced  2005 and 2011    both knees  . Wisdom tooth extraction    . Abdominal hysterectomy  1984    Allergies  Allergen Reactions  . Niaspan [Niacin Er] Nausea And Vomiting and Swelling    Swelling in mouth  . Pantoprazole     Mouth sores  . Bupropion Other (See Comments)    Uncontrollable shakes  . Penicillins Hives  . Statins   . Sulfonamide Derivatives  Hives    History   Social History  . Marital Status: Divorced    Spouse Name: N/A    Number of Children: 2  . Years of Education: N/A   Occupational History  . Not on file.   Social History Main Topics  . Smoking status: Former Smoker -- 2.00 packs/day for 30 years    Types: Cigarettes    Quit date: 12/03/1994  . Smokeless tobacco: Never Used  . Alcohol Use: No  . Drug Use: No  . Sexual Activity: No   Other Topics Concern  . Not on file   Social History Narrative  . No narrative on file    Family History  Problem Relation Age of Onset  . Stroke Mother     mini stroke  . Kidney disease Mother   . Heart failure Mother   . Hypertension Mother   . Diabetes Sister     type 2  . Hyperlipidemia Sister     Review of Systems  Constitutional: Positive for unexpected weight change. Negative for fever.  HENT: Positive for postnasal drip, rhinorrhea and sinus pressure. Negative for congestion, dental problem, ear pain, nosebleeds, sneezing, sore throat and trouble swallowing.  Eyes: Negative for redness and itching.  Respiratory: Positive for chest tightness, shortness of breath and wheezing. Negative for cough.   Cardiovascular: Positive for leg swelling. Negative for palpitations.  Gastrointestinal: Negative for nausea and vomiting.  Genitourinary: Negative for dysuria.  Musculoskeletal: Negative for joint swelling.  Skin: Negative for rash.  Neurological: Positive for headaches.  Hematological: Does not bruise/bleed easily.  Psychiatric/Behavioral: Negative for dysphoric mood. The patient is not nervous/anxious.        Objective:   Physical Exam  Gen. Pleasant, well-nourished, in no distress, normal affect ENT - no lesions, no post nasal drip, class 2 airway Neck: No JVD, no thyromegaly, no carotid bruits Lungs: no use of accessory muscles, no dullness to percussion, clear without rales or rhonchi  Cardiovascular: Rhythm regular, heart sounds  normal, no  murmurs or gallops, no peripheral edema Abdomen: soft and non-tender, no hepatosplenomegaly, BS normal. Musculoskeletal: No deformities, no cyanosis or clubbing Neuro:  alert, non focal        Assessment & Plan:

## 2013-07-07 NOTE — Patient Instructions (Signed)
Sleep study will be scheduled We will advise you about CPAP & oxygen use accordingly Obtain old studies from [facility]

## 2013-07-07 NOTE — Assessment & Plan Note (Signed)
Sleep study will be scheduled We will advise you about CPAP & oxygen use accordingly Obtain old studies from [facility]  Given excessive daytime somnolence, narrow pharyngeal exam, witnessed apneas & loud snoring, obstructive sleep apnea is very likely & an overnight polysomnogram will be scheduled as a split study. The pathophysiology of obstructive sleep apnea , it's cardiovascular consequences & modes of treatment including CPAP were discused with the patient in detail & they evidenced understanding.

## 2013-07-08 ENCOUNTER — Telehealth: Payer: Self-pay | Admitting: Pulmonary Disease

## 2013-07-08 NOTE — Telephone Encounter (Signed)
Patient states she had previous sleep performed at:  [facility] [address] [place] [address]

## 2013-07-08 NOTE — Telephone Encounter (Signed)
I contacted [facility] to obtain records on this patient.  The downloads from her cpap are accessible and I have had those faxed to our office, but any sleep studies that she may have had done there have been archived.  I will call again tomorrow to try and obtain these archived records.  Nothing further needed at this time.  Caulfield,[name], CMA

## 2013-07-25 ENCOUNTER — Encounter: Payer: Self-pay | Admitting: Family Medicine

## 2013-07-25 ENCOUNTER — Ambulatory Visit (INDEPENDENT_AMBULATORY_CARE_PROVIDER_SITE_OTHER): Payer: Medicare Other | Admitting: Family Medicine

## 2013-07-25 ENCOUNTER — Telehealth: Payer: Self-pay | Admitting: Family Medicine

## 2013-07-25 VITALS — BP 110/80 | HR 94 | Temp 98.1°F | Ht 64.0 in | Wt 213.1 lb

## 2013-07-25 DIAGNOSIS — J449 Chronic obstructive pulmonary disease, unspecified: Secondary | ICD-10-CM

## 2013-07-25 DIAGNOSIS — E039 Hypothyroidism, unspecified: Secondary | ICD-10-CM

## 2013-07-25 DIAGNOSIS — I2699 Other pulmonary embolism without acute cor pulmonale: Secondary | ICD-10-CM

## 2013-07-25 DIAGNOSIS — N189 Chronic kidney disease, unspecified: Secondary | ICD-10-CM

## 2013-07-25 DIAGNOSIS — G4730 Sleep apnea, unspecified: Secondary | ICD-10-CM

## 2013-07-25 DIAGNOSIS — I509 Heart failure, unspecified: Secondary | ICD-10-CM

## 2013-07-25 DIAGNOSIS — K5900 Constipation, unspecified: Secondary | ICD-10-CM

## 2013-07-25 LAB — PROTIME-INR: Prothrombin Time: 19 seconds — ABNORMAL HIGH (ref 11.6–15.2)

## 2013-07-25 MED ORDER — WARFARIN SODIUM 5 MG PO TABS: ORAL_TABLET | ORAL | Status: DC

## 2013-07-25 NOTE — Assessment & Plan Note (Signed)
Previous TSH wnl but patient c/o worsening hair loss and cold intolerance will recheck TSH today

## 2013-07-25 NOTE — Progress Notes (Signed)
Pre visit review using our clinic review tool, if applicable. No additional management support is needed unless otherwise documented below in the visit note. 

## 2013-07-25 NOTE — Progress Notes (Signed)
Emily Mitchell [id number] Apr 30, 1946 07/25/2013      Progress Note-Follow Up  Subjective  Chief Complaint  Chief Complaint  Patient presents with  . Follow-up    4 week    HPI  Patient is a 67 yo Caucasian female in today for follow up. Doing well. No recent illness, sob, cp, palp, GI or GU c/o. Taking meds as prescribed. Awaiting sleep study next month.   Past Medical History  Diagnosis Date  . Carotid artery occlusion   . Hyperlipidemia   . Hypertension   . COPD (chronic obstructive pulmonary disease)   . Chicken pox as a child  . Mumps 52 yrs old  . GERD (gastroesophageal reflux disease)   . Asthma   . Arthritis   . Hypothyroidism   . Chronic kidney disease     Bright's Disease at age 53   . Tremor   . CHF (congestive heart failure)   . Anxiety and depression 12/14/2008    Qualifier: Diagnosis of  By: [name] LPN, Almyra Free    . Pulmonary emboli 05/26/2013    Past Surgical History  Procedure Laterality Date  . Carotid endarterectomy Right 05/10/07    cea  . Knees replaced  2005 and 2011    both knees  . Wisdom tooth extraction    . Abdominal hysterectomy  1984    Family History  Problem Relation Age of Onset  . Stroke Mother     mini stroke  . Kidney disease Mother   . Heart failure Mother   . Hypertension Mother   . Diabetes Sister     type 2  . Hyperlipidemia Sister     History   Social History  . Marital Status: Divorced    Spouse Name: N/A    Number of Children: 2  . Years of Education: N/A   Occupational History  . Not on file.   Social History Main Topics  . Smoking status: Former Smoker -- 2.00 packs/day for 30 years    Types: Cigarettes    Quit date: 12/03/1994  . Smokeless tobacco: Never Used  . Alcohol Use: No  . Drug Use: No  . Sexual Activity: No   Other Topics Concern  . Not on file   Social History Narrative  . No narrative on file    Current Outpatient Prescriptions on File Prior to Visit  Medication Sig Dispense  Refill  . albuterol (PROVENTIL HFA;VENTOLIN HFA) 108 (90 BASE) MCG/ACT inhaler Inhale 2 puffs into the lungs every 4 (four) hours as needed for wheezing.      . B Complex Vitamins (B-COMPLEX/B-12 TR PO) Take 1 tablet by mouth daily.       . calcium citrate-vitamin D (CITRACAL+D) 315-200 MG-UNIT per tablet Take 1 tablet by mouth daily.       . Cholecalciferol (D3 MAXIMUM STRENGTH) 5000 UNITS capsule Take 5,000 Units by mouth daily.      . Choline Fenofibrate (FENOFIBRIC ACID) 135 MG CPDR Take 1 tablet by mouth daily.       . citalopram (CELEXA) 40 MG tablet Take 1 tablet (40 mg total) by mouth daily.  90 tablet  1  . clonazePAM (KLONOPIN) 1 MG tablet Take 1 tablet (1 mg total) by mouth 3 (three) times daily as needed for anxiety.  30 tablet  0  . Fluticasone-Salmeterol (ADVAIR) 250-50 MCG/DOSE AEPB Inhale 1 puff into the lungs every 12 (twelve) hours.      Marland Kitchen levalbuterol (XOPENEX) 1.25 MG/0.5ML nebulizer solution Take 1  ampule by nebulization every 4 (four) hours as needed for wheezing.      Marland Kitchen levothyroxine (SYNTHROID, LEVOTHROID) 137 MCG tablet Take 137 mcg by mouth daily before breakfast.       . montelukast (SINGULAIR) 10 MG tablet Take 10 mg by mouth at bedtime.       . Multiple Vitamins-Minerals (PRESERVISION AREDS PO) Take 10 g by mouth every morning.      Marland Kitchen NEXIUM 40 MG capsule Take 40 mg by mouth 2 (two) times daily.       . nitroGLYCERIN (NITROSTAT) 0.4 MG SL tablet Place 1 tablet (0.4 mg total) under the tongue every 5 (five) minutes as needed for chest pain.  25 tablet  3  . telmisartan-hydrochlorothiazide (MICARDIS HCT) 80-12.5 MG per tablet Take 1 tablet by mouth daily.       . traMADol (ULTRAM) 50 MG tablet Take 50 mg by mouth every 6 (six) hours as needed for pain.      Marland Kitchen ZETIA 10 MG tablet Take 10 mg by mouth daily.        No current facility-administered medications on file prior to visit.    Allergies  Allergen Reactions  . Niaspan [Niacin Er] Nausea And Vomiting and Swelling     Swelling in mouth  . Pantoprazole     Mouth sores  . Bupropion Other (See Comments)    Uncontrollable shakes  . Penicillins Hives  . Statins   . Sulfonamide Derivatives Hives    Review of Systems  Review of Systems  Constitutional: Negative for fever and malaise/fatigue.  HENT: Negative for congestion.   Eyes: Negative for discharge.  Respiratory: Negative for shortness of breath.   Cardiovascular: Negative for chest pain, palpitations and leg swelling.  Gastrointestinal: Negative for nausea, abdominal pain and diarrhea.  Genitourinary: Negative for dysuria.  Musculoskeletal: Negative for falls.  Skin: Negative for rash.  Neurological: Negative for loss of consciousness and headaches.  Endo/Heme/Allergies: Negative for polydipsia.  Psychiatric/Behavioral: Negative for depression and suicidal ideas. The patient is not nervous/anxious and does not have insomnia.     Objective  BP 110/80  Pulse 94  Temp(Src) 98.1 F (36.7 C) (Oral)  Ht 5\' 4"  (1.626 m)  Wt 213 lb 1.9 oz (96.671 kg)  BMI 36.56 kg/m2  SpO2 92%  Physical Exam  Physical Exam  Constitutional: She is oriented to person, place, and time and well-developed, well-nourished, and in no distress. No distress.  HENT:  Head: Normocephalic and atraumatic.  Eyes: Conjunctivae are normal.  Neck: Neck supple. No thyromegaly present.  Cardiovascular: Normal rate, regular rhythm and normal heart sounds.   No murmur heard. Pulmonary/Chest: Effort normal and breath sounds normal. She has no wheezes.  Abdominal: She exhibits no distension and no mass.  Musculoskeletal: She exhibits no edema.  Lymphadenopathy:    She has no cervical adenopathy.  Neurological: She is alert and oriented to person, place, and time.  Skin: Skin is warm and dry. No rash noted. She is not diaphoretic.  Psychiatric: Memory, affect and judgment normal.    Lab Results  Component Value Date   TSH 0.662 04/29/2013   Lab Results  Component  Value Date   WBC 8.1 04/29/2013   HGB 13.0 04/29/2013   HCT 38.3 04/29/2013   MCV 87.4 04/29/2013   PLT 268 04/29/2013   Lab Results  Component Value Date   CREATININE 2.26* 04/29/2013   BUN 33* 04/29/2013   NA 141 04/29/2013   K 4.5 04/29/2013  CL 102 04/29/2013   CO2 29 04/29/2013   Lab Results  Component Value Date   ALT 20 05/26/2013   AST 21 05/26/2013   ALKPHOS 34* 05/26/2013   BILITOT 0.5 05/26/2013   Lab Results  Component Value Date   CHOL 193 05/26/2013   Lab Results  Component Value Date   HDL 48 05/26/2013   Lab Results  Component Value Date   LDLCALC 100* 05/26/2013   Lab Results  Component Value Date   TRIG 227* 05/26/2013   Lab Results  Component Value Date   CHOLHDL 4.0 05/26/2013     Assessment & Plan  CONSTIPATION Bowels moving well at this time.  CHRONIC OBSTRUCTIVE PULMONARY DISEASE, OXYGEN DEPENDENT Is following with Pulmonology now. Doing well. No changes.  Hypothyroidism Previous TSH wnl but patient c/o worsening hair loss and cold intolerance will recheck TSH today  SLEEP APNEA Scheduled for sleep study on 08/13/2012. Is not presently using her CPAP  Chronic kidney disease Following with nephrology, asymptomatic  Pulmonary emboli Check PT/INR today

## 2013-07-25 NOTE — Telephone Encounter (Signed)
LAB ORDER 08-28-2013 4 weeks, PT/INR only

## 2013-07-25 NOTE — Patient Instructions (Signed)
DASH Diet  The DASH diet stands for "Dietary Approaches to Stop Hypertension." It is a healthy eating plan that has been shown to reduce high blood pressure (hypertension) in as little as 14 days, while also possibly providing other significant health benefits. These other health benefits include reducing the risk of breast cancer after menopause and reducing the risk of type 2 diabetes, heart disease, colon cancer, and stroke. Health benefits also include weight loss and slowing kidney failure in patients with chronic kidney disease.   DIET GUIDELINES  · Limit salt (sodium). Your diet should contain less than 1500 mg of sodium daily.  · Limit refined or processed carbohydrates. Your diet should include mostly whole grains. Desserts and added sugars should be used sparingly.  · Include small amounts of heart-healthy fats. These types of fats include nuts, oils, and tub margarine. Limit saturated and trans fats. These fats have been shown to be harmful in the body.  CHOOSING FOODS   The following food groups are based on a 2000 calorie diet. See your Registered Dietitian for individual calorie needs.  Grains and Grain Products (6 to 8 servings daily)  · Eat More Often: Whole-wheat bread, brown rice, whole-grain or wheat pasta, quinoa, popcorn without added fat or salt (air popped).  · Eat Less Often: White bread, white pasta, white rice, cornbread.  Vegetables (4 to 5 servings daily)  · Eat More Often: Fresh, frozen, and canned vegetables. Vegetables may be raw, steamed, roasted, or grilled with a minimal amount of fat.  · Eat Less Often/Avoid: Creamed or fried vegetables. Vegetables in a cheese sauce.  Fruit (4 to 5 servings daily)  · Eat More Often: All fresh, canned (in natural juice), or frozen fruits. Dried fruits without added sugar. One hundred percent fruit juice (½ cup [237 mL] daily).  · Eat Less Often: Dried fruits with added sugar. Canned fruit in light or heavy syrup.  Lean Meats, Fish, and Poultry (2  servings or less daily. One serving is 3 to 4 oz [85-114 g]).  · Eat More Often: Ninety percent or leaner ground beef, tenderloin, sirloin. Round cuts of beef, chicken breast, turkey breast. All fish. Grill, bake, or broil your meat. Nothing should be fried.  · Eat Less Often/Avoid: Fatty cuts of meat, turkey, or chicken leg, thigh, or wing. Fried cuts of meat or fish.  Dairy (2 to 3 servings)  · Eat More Often: Low-fat or fat-free milk, low-fat plain or light yogurt, reduced-fat or part-skim cheese.  · Eat Less Often/Avoid: Milk (whole, 2%). Whole milk yogurt. Full-fat cheeses.  Nuts, Seeds, and Legumes (4 to 5 servings per week)  · Eat More Often: All without added salt.  · Eat Less Often/Avoid: Salted nuts and seeds, canned beans with added salt.  Fats and Sweets (limited)  · Eat More Often: Vegetable oils, tub margarines without trans fats, sugar-free gelatin. Mayonnaise and salad dressings.  · Eat Less Often/Avoid: Coconut oils, palm oils, butter, stick margarine, cream, half and half, cookies, candy, pie.  FOR MORE INFORMATION  The Dash Diet Eating Plan: www.dashdiet.org  Document Released: 07/06/2011 Document Revised: 10/09/2011 Document Reviewed: 07/06/2011  [employer]® Patient Information ©2014 [employer].

## 2013-07-25 NOTE — Assessment & Plan Note (Signed)
Bowels moving well at this time.

## 2013-07-25 NOTE — Assessment & Plan Note (Signed)
Scheduled for sleep study on 08/13/2012. Is not presently using her CPAP

## 2013-07-25 NOTE — Assessment & Plan Note (Signed)
Check PT/INR today

## 2013-07-25 NOTE — Assessment & Plan Note (Signed)
Following with nephrology, asymptomatic

## 2013-07-25 NOTE — Assessment & Plan Note (Signed)
Is following with Pulmonology now. Doing well. No changes.

## 2013-08-13 ENCOUNTER — Ambulatory Visit (HOSPITAL_BASED_OUTPATIENT_CLINIC_OR_DEPARTMENT_OTHER): Payer: Medicare Other | Attending: Pulmonary Disease | Admitting: Radiology

## 2013-08-13 VITALS — Ht 64.0 in | Wt 213.0 lb

## 2013-08-13 DIAGNOSIS — G4733 Obstructive sleep apnea (adult) (pediatric): Secondary | ICD-10-CM | POA: Insufficient documentation

## 2013-08-13 DIAGNOSIS — G4761 Periodic limb movement disorder: Secondary | ICD-10-CM | POA: Insufficient documentation

## 2013-08-13 DIAGNOSIS — G4730 Sleep apnea, unspecified: Secondary | ICD-10-CM

## 2013-08-15 ENCOUNTER — Telehealth: Payer: Self-pay | Admitting: Family Medicine

## 2013-08-15 MED ORDER — CLONAZEPAM 1 MG PO TABS: 1.0000 mg | ORAL_TABLET | Freq: Three times a day (TID) | ORAL | Status: DC | PRN

## 2013-08-15 MED ORDER — LEVOTHYROXINE SODIUM 137 MCG PO TABS: 137.0000 ug | ORAL_TABLET | Freq: Every day | ORAL | Status: DC

## 2013-08-15 MED ORDER — TELMISARTAN-HCTZ 80-12.5 MG PO TABS: 1.0000 | ORAL_TABLET | Freq: Every day | ORAL | Status: DC

## 2013-08-15 MED ORDER — FENOFIBRIC ACID 135 MG PO CPDR: 1.0000 | DELAYED_RELEASE_CAPSULE | Freq: Every day | ORAL | Status: DC

## 2013-08-15 NOTE — Telephone Encounter (Signed)
Please advise refill? Last RX was done on 03-17-13 quantity 30 with 0 refills  If ok fax to [phone]

## 2013-08-15 NOTE — Telephone Encounter (Signed)
OK to send 30 tabs of each except levothyroxine no additional refills.  Levothyroxine 137 mcg instructions-should be: take one tablet by mouth first thing every morning- 6 days a week. Wait 30 minutes to eat or take orther medications.

## 2013-08-15 NOTE — Telephone Encounter (Signed)
All rxs previously sent except clonazepam. Called rx to pharmacy voicemail, #30 x no refills.

## 2013-08-15 NOTE — Telephone Encounter (Signed)
Refill- trilipix 116m cap. Take on capsule by mouth once daily for triglycerides. Qty 30 last fill 12.9.14  Refill- levothyroxine 1331m tab. Take one tablet by mouth first thing every morning. Wait 30 minutes to eat or take orther medications. Qty 30 lst fill 12.9.14  Refill- micardis hct 80/12.5 tab. Take one tablet by mouth once daily. Qty 84 last fill 1.15.15  Refill- clonazepam 1 mg tab. Take one tablet by mouth three times daily as needed. Qty 90 last fill 10.9.14

## 2013-08-21 ENCOUNTER — Telehealth: Payer: Self-pay | Admitting: Pulmonary Disease

## 2013-08-21 DIAGNOSIS — G4730 Sleep apnea, unspecified: Secondary | ICD-10-CM

## 2013-08-21 DIAGNOSIS — G4733 Obstructive sleep apnea (adult) (pediatric): Secondary | ICD-10-CM

## 2013-08-21 NOTE — Sleep Study (Signed)
[place]   NAME: Emily Mitchell  DATE OF BIRTH: 12/01/1945  MEDICAL RECORD [id number]  LOCATION: [facility]   PHYSICIAN: ALVA,RAKESH V.   DATE OF STUDY: 08/13/2013   SLEEP STUDY TYPE: Nocturnal Polysomnogram   REFERRING PHYSICIAN: Rigoberto Noel, MD   INDICATION FOR STUDY:  68 y.o referred for evaluation of obstructive sleep apnea  Patient is struggling with restless sleep and a h/o sleep apnea, not presently using CPAP per patient has had 2 conflicting sleep studies one said he needed cpap and one said she did not. Underwent PSG at [facility] 08/2011 -AHI 4/h, TST 319 mins,supine  At the time of this study ,they weighed 213 pounds with a height of 5 ft 4 inches and the BMI of 37, neck size of 15 inches. Epworth sleepiness score was 10   This nocturnal polysomnogram was performed with a sleep technologist in attendance. EEG, EOG,EMG and respiratory parameters recorded. Sleep stages, arousals, limb movements and respiratory data was scored according to criteria laid out by the American Academy of sleep medicine.   SLEEP ARCHITECTURE: Lights out was at 22-11 PM and lights on was at 4-58 AM. Total sleep time was 390 minutes with a sleep period time of 394 minutes and a sleep efficiency of 96 %. Sleep latency was 13 minutes with latency to REM sleep of 287 minutes and wake after sleep onset of 3 minutes. . Sleep stages as a percentage of total sleep time was N1 -2 %,N2- 85 % and REM sleep 13 % ( 50 minutes) . The longest period of REM sleep was around 3 AM.   AROUSAL DATA : There were 59  arousals with an arousal index of 9 events per hour. Most of these were spontaneous & 9 were associated with respiratory events  RESPIRATORY DATA: There were 0 obstructive apneas, 7 central apneas, 0 mixed apneas and 32 hypopneas with apnea -hypopnea index of 6 events per hour. There were 14 RERAs with an RDI of  events per hour. There was slight worsening in  supine position. Supine REM sleep was  noted  MOVEMENT/PARASOMNIA: There were 80 PLMS with a PLM index of 12 events per hour. The PLM arousal index was 1.5 per hour.  OXYGEN DATA: The lowest desaturation was 87 % during REM sleep and the desaturation index was 6 per hour.   CARDIAC DATA: The low heart rate was 41 beats per minute. The high heart rate recorded was an artifact. No arrhythmias were noted   DISCUSSION -Moderatesnoring was noted . She did not meet criteria for CPAP intervention. She was desensitized with a  Small full face mask  IMPRESSION :  1. Mild obstructive sleep apnea with hypopneas causing sleep fragmentation and mild oxygen desaturation.  2. No evidence of cardiac arrhythmias or behavioral disturbance during sleep.  3. Sleep efficiency was good, Supine REM sleep was noted. 4. Mild periodic limb movements , significance unclear  RECOMMENDATION:  1. Treatment options for this degree of sleep disordered breathing include weight loss alone and/or CPAP therapy and/ or oral appliance.  2. Patient should be cautioned against driving when sleepy  3. They should be asked to avoid medications with sedative side effects    ALVA,RAKESH V.  Diplomate, [employer] adviser of Sleep Medicine    ELECTRONICALLY SIGNED ON: 08/21/2013  [facility]  PH: [phone] FX: [phone] Joseph

## 2013-08-21 NOTE — Telephone Encounter (Signed)
Spoke with pt and appt scheduled for pt in [place] with RA. Nothing further needed

## 2013-08-21 NOTE — Telephone Encounter (Signed)
PSG showed mild OSA Routine oV to discuss options

## 2013-08-23 ENCOUNTER — Telehealth: Payer: Self-pay | Admitting: Family Medicine

## 2013-08-23 NOTE — Telephone Encounter (Signed)
zetia 10 mg tablets patient is out of meds

## 2013-08-25 NOTE — Telephone Encounter (Signed)
I do not see that we have filled Zetia for pt before.  Is it ok to send refill?

## 2013-08-25 NOTE — Telephone Encounter (Signed)
OK to fill Zetia 10 mg po daily disp 30 day supply with 5 rf or 90 day supply with 1 rf

## 2013-08-26 MED ORDER — EZETIMIBE 10 MG PO TABS: 10.0000 mg | ORAL_TABLET | Freq: Every day | ORAL | Status: DC

## 2013-08-27 ENCOUNTER — Telehealth: Payer: Self-pay | Admitting: Family Medicine

## 2013-08-27 NOTE — Telephone Encounter (Signed)
Relevant patient education mailed to patient.  

## 2013-08-28 LAB — PROTIME-INR
INR: 1.8 — ABNORMAL HIGH (ref <1.50)
Prothrombin Time: 20.5 seconds — ABNORMAL HIGH (ref 11.6–15.2)

## 2013-08-29 NOTE — Telephone Encounter (Signed)
Quick Note:  Patient Informed and voiced understanding ______ 

## 2013-09-11 ENCOUNTER — Encounter: Payer: Self-pay | Admitting: Pulmonary Disease

## 2013-09-11 ENCOUNTER — Ambulatory Visit (INDEPENDENT_AMBULATORY_CARE_PROVIDER_SITE_OTHER): Payer: Medicare Other | Admitting: Pulmonary Disease

## 2013-09-11 VITALS — BP 116/80 | HR 75 | Ht 64.0 in | Wt 208.0 lb

## 2013-09-11 DIAGNOSIS — J4489 Other specified chronic obstructive pulmonary disease: Secondary | ICD-10-CM

## 2013-09-11 DIAGNOSIS — R251 Tremor, unspecified: Secondary | ICD-10-CM

## 2013-09-11 DIAGNOSIS — J449 Chronic obstructive pulmonary disease, unspecified: Secondary | ICD-10-CM

## 2013-09-11 DIAGNOSIS — G4730 Sleep apnea, unspecified: Secondary | ICD-10-CM

## 2013-09-11 DIAGNOSIS — R259 Unspecified abnormal involuntary movements: Secondary | ICD-10-CM

## 2013-09-11 NOTE — Assessment & Plan Note (Signed)
Stable , on advair

## 2013-09-11 NOTE — Progress Notes (Signed)
   Subjective:    Patient ID: Emily Mitchell, female    DOB: Jul 23, 1946, 68 y.o.   MRN: [id number]  HPI 68 y.o  for FU of obstructive sleep apnea  Patient is struggling with restless sleep and a h/o sleep apnea, not presently using CPAP per patient has had 2 conflicting sleep studies one said he needed cpap and one said she did not. Underwent PSG at [facility] 08/2011 -AHI 4/h, TST 319 mins,supine - and Dr Jarome Lamas said she did not need CPAP. Dr Luan Pulling said she did  Copd dx 2004. Pt saw [facility] in the past.  Spirometry 05/12/2013: FEV1 72% predicted FEV1 FVC ratio 73 She was diagnosed with PE in 01/2013, had PE dx. At [facility] - on coumadin  Placed on O2 during sleep  ESS 11    PSG showed mild OSA TST - 390 mins,  There were 0 obstructive apneas, 7 central apneas, 0 mixed apneas and 32 hypopneas with apnea -hypopnea index of 6 events per hour. There were 14 RERAs with an RDI of 8 events per hour. The lowest desaturation was 87%   There were 80 PLMS with a PLM index of 12 events per hour. The PLM arousal index was 1.5 per hour. Lost 9 lbs from 217 to 208      Review of Systems neg for any significant sore throat, dysphagia, itching, sneezing, nasal congestion or excess/ purulent secretions, fever, chills, sweats, unintended wt loss, pleuritic or exertional cp, hempoptysis, orthopnea pnd or change in chronic leg swelling. Also denies presyncope, palpitations, heartburn, abdominal pain, nausea, vomiting, diarrhea or change in bowel or urinary habits, dysuria,hematuria, rash, arthralgias, visual complaints, headache, numbness weakness or ataxia.     Objective:   Physical Exam  Gen. Pleasant, obese, in no distress ENT - no lesions, no post nasal drip Neck: No JVD, no thyromegaly, no carotid bruits Lungs: no use of accessory muscles, no dullness to percussion, decreased without rales or rhonchi  Cardiovascular: Rhythm regular, heart sounds  normal, no murmurs or gallops,  no peripheral edema Musculoskeletal: No deformities, no cyanosis or clubbing , no tremors       Assessment & Plan:

## 2013-09-11 NOTE — Assessment & Plan Note (Signed)
You have mild obstructive sleep apnea  Weight loss is recommended Can stay off CPAP

## 2013-09-11 NOTE — Patient Instructions (Signed)
You have mild obstructive sleep apnea  Weight loss is recommended Can stay off CPAP Discuss with Dr Randel Pigg about your tremors & gait

## 2013-09-11 NOTE — Assessment & Plan Note (Signed)
Resting tremors & gait issues raise question of parkinsons'

## 2013-09-15 ENCOUNTER — Telehealth: Payer: Self-pay | Admitting: Family Medicine

## 2013-09-15 MED ORDER — WARFARIN SODIUM 5 MG PO TABS: ORAL_TABLET | ORAL | Status: DC

## 2013-09-15 NOTE — Telephone Encounter (Signed)
Refill warfarin  

## 2013-09-16 ENCOUNTER — Other Ambulatory Visit: Payer: Self-pay | Admitting: Vascular Surgery

## 2013-09-16 DIAGNOSIS — I6529 Occlusion and stenosis of unspecified carotid artery: Secondary | ICD-10-CM

## 2013-09-16 DIAGNOSIS — Z48812 Encounter for surgical aftercare following surgery on the circulatory system: Secondary | ICD-10-CM

## 2013-09-24 ENCOUNTER — Telehealth: Payer: Self-pay | Admitting: Family Medicine

## 2013-09-24 MED ORDER — WARFARIN SODIUM 5 MG PO TABS: ORAL_TABLET | ORAL | Status: DC

## 2013-09-24 NOTE — Telephone Encounter (Signed)
Patient called in to reschedule her appointment for tomorrow morning and she states that she only has 1 pill of warfarin left and would like a refill

## 2013-09-25 ENCOUNTER — Ambulatory Visit: Payer: Medicare Other | Admitting: Family Medicine

## 2013-10-16 ENCOUNTER — Other Ambulatory Visit: Payer: Self-pay | Admitting: Family Medicine

## 2013-10-16 ENCOUNTER — Ambulatory Visit (INDEPENDENT_AMBULATORY_CARE_PROVIDER_SITE_OTHER): Payer: Medicare Other | Admitting: Family Medicine

## 2013-10-16 ENCOUNTER — Encounter: Payer: Self-pay | Admitting: Family Medicine

## 2013-10-16 VITALS — BP 108/64 | HR 72 | Temp 97.8°F | Ht 64.0 in | Wt 202.0 lb

## 2013-10-16 DIAGNOSIS — R7989 Other specified abnormal findings of blood chemistry: Secondary | ICD-10-CM

## 2013-10-16 DIAGNOSIS — R259 Unspecified abnormal involuntary movements: Secondary | ICD-10-CM

## 2013-10-16 DIAGNOSIS — J449 Chronic obstructive pulmonary disease, unspecified: Secondary | ICD-10-CM

## 2013-10-16 DIAGNOSIS — R946 Abnormal results of thyroid function studies: Secondary | ICD-10-CM

## 2013-10-16 DIAGNOSIS — N390 Urinary tract infection, site not specified: Secondary | ICD-10-CM

## 2013-10-16 DIAGNOSIS — Z7901 Long term (current) use of anticoagulants: Secondary | ICD-10-CM

## 2013-10-16 DIAGNOSIS — R251 Tremor, unspecified: Secondary | ICD-10-CM

## 2013-10-16 DIAGNOSIS — I2699 Other pulmonary embolism without acute cor pulmonale: Secondary | ICD-10-CM

## 2013-10-16 DIAGNOSIS — A048 Other specified bacterial intestinal infections: Secondary | ICD-10-CM

## 2013-10-16 DIAGNOSIS — E785 Hyperlipidemia, unspecified: Secondary | ICD-10-CM

## 2013-10-16 DIAGNOSIS — R109 Unspecified abdominal pain: Secondary | ICD-10-CM

## 2013-10-16 DIAGNOSIS — R1013 Epigastric pain: Secondary | ICD-10-CM

## 2013-10-16 DIAGNOSIS — Z5181 Encounter for therapeutic drug level monitoring: Secondary | ICD-10-CM

## 2013-10-16 LAB — CBC
HEMATOCRIT: 37.2 % (ref 36.0–46.0)
HEMOGLOBIN: 12.6 g/dL (ref 12.0–15.0)
MCH: 29.1 pg (ref 26.0–34.0)
MCHC: 33.9 g/dL (ref 30.0–36.0)
MCV: 85.9 fL (ref 78.0–100.0)
Platelets: 252 10*3/uL (ref 150–400)
RBC: 4.33 MIL/uL (ref 3.87–5.11)
RDW: 13.7 % (ref 11.5–15.5)
WBC: 6.7 10*3/uL (ref 4.0–10.5)

## 2013-10-16 LAB — LIPID PANEL
CHOLESTEROL: 171 mg/dL (ref 0–200)
HDL: 42 mg/dL (ref >39)
LDL Cholesterol: 83 mg/dL (ref 0–99)
Total CHOL/HDL Ratio: 4.1 Ratio
Triglycerides: 230 mg/dL — ABNORMAL HIGH (ref <150)
VLDL: 46 mg/dL — ABNORMAL HIGH (ref 0–40)

## 2013-10-16 LAB — RENAL FUNCTION PANEL
Albumin: 4.3 g/dL (ref 3.5–5.2)
BUN: 45 mg/dL — ABNORMAL HIGH (ref 6–23)
CALCIUM: 9.7 mg/dL (ref 8.4–10.5)
CO2: 28 meq/L (ref 19–32)
CREATININE: 2.13 mg/dL — AB (ref 0.50–1.10)
Chloride: 103 mEq/L (ref 96–112)
GLUCOSE: 86 mg/dL (ref 70–99)
Phosphorus: 3.3 mg/dL (ref 2.3–4.6)
Potassium: 4.4 mEq/L (ref 3.5–5.3)
SODIUM: 139 meq/L (ref 135–145)

## 2013-10-16 LAB — HEPATIC FUNCTION PANEL
ALT: 17 U/L (ref 0–35)
AST: 18 U/L (ref 0–37)
Albumin: 4.3 g/dL (ref 3.5–5.2)
Alkaline Phosphatase: 39 U/L (ref 39–117)
BILIRUBIN DIRECT: 0.1 mg/dL (ref 0.0–0.3)
Indirect Bilirubin: 0.3 mg/dL (ref 0.2–1.2)
Total Bilirubin: 0.4 mg/dL (ref 0.2–1.2)
Total Protein: 7.1 g/dL (ref 6.0–8.3)

## 2013-10-16 LAB — T4, FREE: Free T4: 1.37 ng/dL (ref 0.80–1.80)

## 2013-10-16 LAB — TSH: TSH: 0.143 u[IU]/mL — ABNORMAL LOW (ref 0.350–4.500)

## 2013-10-16 NOTE — Patient Instructions (Signed)
Probiotic such as Digestive Advantage, Align, or a generic  Gastroesophageal Reflux Disease, Adult Gastroesophageal reflux disease (GERD) happens when acid from your stomach flows up into the esophagus. When acid comes in contact with the esophagus, the acid causes soreness (inflammation) in the esophagus. Over time, GERD may create small holes (ulcers) in the lining of the esophagus. CAUSES   Increased body weight. This puts pressure on the stomach, making acid rise from the stomach into the esophagus.  Smoking. This increases acid production in the stomach.  Drinking alcohol. This causes decreased pressure in the lower esophageal sphincter (valve or ring of muscle between the esophagus and stomach), allowing acid from the stomach into the esophagus.  Late evening meals and a full stomach. This increases pressure and acid production in the stomach.  A malformed lower esophageal sphincter. Sometimes, no cause is found. SYMPTOMS   Burning pain in the lower part of the mid-chest behind the breastbone and in the mid-stomach area. This may occur twice a week or more often.  Trouble swallowing.  Sore throat.  Dry cough.  Asthma-like symptoms including chest tightness, shortness of breath, or wheezing. DIAGNOSIS  Your caregiver may be able to diagnose GERD based on your symptoms. In some cases, X-rays and other tests may be done to check for complications or to check the condition of your stomach and esophagus. TREATMENT  Your caregiver may recommend over-the-counter or prescription medicines to help decrease acid production. Ask your caregiver before starting or adding any new medicines.  HOME CARE INSTRUCTIONS   Change the factors that you can control. Ask your caregiver for guidance concerning weight loss, quitting smoking, and alcohol consumption.  Avoid foods and drinks that make your symptoms worse, such as:  Caffeine or alcoholic drinks.  Chocolate.  Peppermint or mint  flavorings.  Garlic and onions.  Spicy foods.  Citrus fruits, such as oranges, lemons, or limes.  Tomato-based foods such as sauce, chili, salsa, and pizza.  Fried and fatty foods.  Avoid lying down for the 3 hours prior to your bedtime or prior to taking a nap.  Eat small, frequent meals instead of large meals.  Wear loose-fitting clothing. Do not wear anything tight around your waist that causes pressure on your stomach.  Raise the head of your bed 6 to 8 inches with wood blocks to help you sleep. Extra pillows will not help.  Only take over-the-counter or prescription medicines for pain, discomfort, or fever as directed by your caregiver.  Do not take aspirin, ibuprofen, or other nonsteroidal anti-inflammatory drugs (NSAIDs). SEEK IMMEDIATE MEDICAL CARE IF:   You have pain in your arms, neck, jaw, teeth, or back.  Your pain increases or changes in intensity or duration.  You develop nausea, vomiting, or sweating (diaphoresis).  You develop shortness of breath, or you faint.  Your vomit is green, yellow, black, or looks like coffee grounds or blood.  Your stool is red, bloody, or black. These symptoms could be signs of other problems, such as heart disease, gastric bleeding, or esophageal bleeding. MAKE SURE YOU:   Understand these instructions.  Will watch your condition.  Will get help right away if you are not doing well or get worse. Document Released: 04/26/2005 Document Revised: 10/09/2011 Document Reviewed: 02/03/2011 [facility] Patient Information 2014 [place], [place].

## 2013-10-16 NOTE — Progress Notes (Signed)
Pre visit review using our clinic review tool, if applicable. No additional management support is needed unless otherwise documented below in the visit note. 

## 2013-10-17 ENCOUNTER — Other Ambulatory Visit: Payer: Self-pay | Admitting: Family Medicine

## 2013-10-17 ENCOUNTER — Telehealth: Payer: Self-pay

## 2013-10-17 DIAGNOSIS — Z7901 Long term (current) use of anticoagulants: Secondary | ICD-10-CM

## 2013-10-17 DIAGNOSIS — Z5181 Encounter for therapeutic drug level monitoring: Secondary | ICD-10-CM

## 2013-10-17 LAB — PROTIME-INR
INR: 2 — ABNORMAL HIGH (ref <1.50)
PROTHROMBIN TIME: 22.2 s — AB (ref 11.6–15.2)

## 2013-10-17 LAB — URINALYSIS
BILIRUBIN URINE: NEGATIVE
Glucose, UA: NEGATIVE mg/dL
Hgb urine dipstick: NEGATIVE
KETONES UR: NEGATIVE mg/dL
Nitrite: NEGATIVE
PH: 5 (ref 5.0–8.0)
Protein, ur: NEGATIVE mg/dL
SPECIFIC GRAVITY, URINE: 1.021 (ref 1.005–1.030)
UROBILINOGEN UA: 0.2 mg/dL (ref 0.0–1.0)

## 2013-10-17 LAB — H. PYLORI ANTIBODY, IGG: H Pylori IgG: 1.44 {ISR} — ABNORMAL HIGH

## 2013-10-17 MED ORDER — TETRACYCLINE HCL 500 MG PO CAPS: 500.0000 mg | ORAL_CAPSULE | Freq: Four times a day (QID) | ORAL | Status: DC

## 2013-10-17 MED ORDER — BISMUTH SUBSALICYLATE 262 MG PO TABS: 2.0000 | ORAL_TABLET | Freq: Four times a day (QID) | ORAL | Status: DC

## 2013-10-17 MED ORDER — METRONIDAZOLE 500 MG PO TABS: 500.0000 mg | ORAL_TABLET | Freq: Three times a day (TID) | ORAL | Status: DC

## 2013-10-17 MED ORDER — OMEPRAZOLE 20 MG PO CPDR
20.0000 mg | DELAYED_RELEASE_CAPSULE | Freq: Two times a day (BID) | ORAL | Status: DC
Start: 2013-10-17 — End: 2013-11-05

## 2013-10-17 NOTE — Telephone Encounter (Signed)
Lab order placed.

## 2013-10-17 NOTE — Telephone Encounter (Signed)
Please advise refill? Do you refill the coumadin

## 2013-10-17 NOTE — Addendum Note (Signed)
Addended by: Varney Daily on: 10/17/2013 05:11 PM   Modules accepted: Orders

## 2013-10-18 LAB — URINE CULTURE

## 2013-10-19 ENCOUNTER — Encounter: Payer: Self-pay | Admitting: Family Medicine

## 2013-10-19 DIAGNOSIS — N390 Urinary tract infection, site not specified: Secondary | ICD-10-CM

## 2013-10-19 DIAGNOSIS — A048 Other specified bacterial intestinal infections: Secondary | ICD-10-CM | POA: Insufficient documentation

## 2013-10-19 DIAGNOSIS — B9681 Helicobacter pylori [H. pylori] as the cause of diseases classified elsewhere: Secondary | ICD-10-CM | POA: Insufficient documentation

## 2013-10-19 HISTORY — DX: Helicobacter pylori (H. pylori) as the cause of diseases classified elsewhere: B96.81

## 2013-10-19 HISTORY — DX: Urinary tract infection, site not specified: N39.0

## 2013-10-19 HISTORY — DX: Other specified bacterial intestinal infections: A04.8

## 2013-10-19 NOTE — Progress Notes (Signed)
Patient ID: Emily Mitchell, female   DOB: [birth year], 68 y.o.   MRN: [id number] MARYANNE DIVITO [id number] 1945/09/30 10/19/2013      Progress Note-Follow Up  Subjective  Chief Complaint  Chief Complaint  Patient presents with  . Follow-up    2 month    HPI  Patient is a 68 year old female in today for routine medical care. Patient is in today for followup been feeling poorly. She's complaining of abdominal pain and mild anorexia. She has nausea and heartburn. No vomiting. She also has lower abdominal pain. She's also still in the kidney. She notes urinary urgency and frequency. No hematuria. No fevers or chills. No chest pain, palpitations or shortness of breath. Is using clonazepam to help her sleep with good results.  Past Medical History  Diagnosis Date  . Carotid artery occlusion   . Hyperlipidemia   . Hypertension   . COPD (chronic obstructive pulmonary disease)   . Chicken pox as a child  . Mumps 66 yrs old  . GERD (gastroesophageal reflux disease)   . Asthma   . Arthritis   . Hypothyroidism   . Chronic kidney disease     Bright's Disease at age 58   . Tremor   . CHF (congestive heart failure)   . Anxiety and depression 12/14/2008    Qualifier: Diagnosis of  By: [name] LPN, Almyra Free    . Pulmonary emboli 05/26/2013  . H. pylori infection 10/19/2013  . UTI (lower urinary tract infection) 10/19/2013    Past Surgical History  Procedure Laterality Date  . Carotid endarterectomy Right 05/10/07    cea  . Knees replaced  2005 and 2011    both knees  . Wisdom tooth extraction    . Abdominal hysterectomy  1984    Family History  Problem Relation Age of Onset  . Stroke Mother     mini stroke  . Kidney disease Mother   . Heart failure Mother   . Hypertension Mother   . Diabetes Sister     type 2  . Hyperlipidemia Sister     History   Social History  . Marital Status: Divorced    Spouse Name: N/A    Number of Children: 2  . Years of Education: N/A    Occupational History  . Not on file.   Social History Main Topics  . Smoking status: Former Smoker -- 2.00 packs/day for 30 years    Types: Cigarettes    Quit date: 12/03/1994  . Smokeless tobacco: Never Used  . Alcohol Use: No  . Drug Use: No  . Sexual Activity: No   Other Topics Concern  . Not on file   Social History Narrative  . No narrative on file    Current Outpatient Prescriptions on File Prior to Visit  Medication Sig Dispense Refill  . albuterol (PROVENTIL HFA;VENTOLIN HFA) 108 (90 BASE) MCG/ACT inhaler Inhale 2 puffs into the lungs every 4 (four) hours as needed for wheezing.      . B Complex Vitamins (B-COMPLEX/B-12 TR PO) Take 1 tablet by mouth daily.       . calcium citrate-vitamin D (CITRACAL+D) 315-200 MG-UNIT per tablet Take 1 tablet by mouth daily.       . Cholecalciferol (D3 MAXIMUM STRENGTH) 5000 UNITS capsule Take 5,000 Units by mouth daily.      . Choline Fenofibrate (FENOFIBRIC ACID) 135 MG CPDR Take 1 tablet by mouth daily.  30 capsule  5  . citalopram (CELEXA) 40  MG tablet Take 1 tablet (40 mg total) by mouth daily.  90 tablet  1  . clonazePAM (KLONOPIN) 1 MG tablet Take 1 tablet (1 mg total) by mouth 3 (three) times daily as needed for anxiety.  30 tablet  0  . ezetimibe (ZETIA) 10 MG tablet Take 1 tablet (10 mg total) by mouth daily.  30 tablet  5  . Fluticasone-Salmeterol (ADVAIR) 250-50 MCG/DOSE AEPB Inhale 1 puff into the lungs every 12 (twelve) hours.      Marland Kitchen levalbuterol (XOPENEX) 1.25 MG/0.5ML nebulizer solution Take 1 ampule by nebulization every 4 (four) hours as needed for wheezing.      Marland Kitchen levothyroxine (SYNTHROID, LEVOTHROID) 137 MCG tablet Take 1 tablet (137 mcg total) by mouth daily before breakfast.  30 tablet  5  . montelukast (SINGULAIR) 10 MG tablet Take 10 mg by mouth at bedtime.       . Multiple Vitamins-Minerals (PRESERVISION AREDS PO) Take 10 g by mouth every morning.      Marland Kitchen NEXIUM 40 MG capsule Take 40 mg by mouth 2 (two) times  daily.       . nitroGLYCERIN (NITROSTAT) 0.4 MG SL tablet Place 1 tablet (0.4 mg total) under the tongue every 5 (five) minutes as needed for chest pain.  25 tablet  3  . telmisartan-hydrochlorothiazide (MICARDIS HCT) 80-12.5 MG per tablet Take 1 tablet by mouth daily. Marland Kitchen5 tablet daily      . traMADol (ULTRAM) 50 MG tablet Take 50 mg by mouth every 6 (six) hours as needed for pain.       No current facility-administered medications on file prior to visit.    Allergies  Allergen Reactions  . Niaspan [Niacin Er] Nausea And Vomiting and Swelling    Swelling in mouth  . Pantoprazole     Mouth sores  . Bupropion Other (See Comments)    Uncontrollable shakes  . Penicillins Hives  . Statins   . Sulfonamide Derivatives Hives    Review of Systems  Review of Systems  Constitutional: Negative for fever and malaise/fatigue.  HENT: Negative for congestion.   Eyes: Negative for discharge.  Respiratory: Negative for shortness of breath.   Cardiovascular: Negative for chest pain, palpitations and leg swelling.  Gastrointestinal: Positive for nausea and abdominal pain. Negative for diarrhea.  Genitourinary: Positive for dysuria, urgency and frequency. Negative for hematuria and flank pain.  Musculoskeletal: Negative for falls.  Skin: Negative for rash.  Neurological: Negative for loss of consciousness and headaches.  Endo/Heme/Allergies: Negative for polydipsia.  Psychiatric/Behavioral: Negative for depression and suicidal ideas. The patient is not nervous/anxious and does not have insomnia.     Objective  BP 108/64  Pulse 72  Temp(Src) 97.8 F (36.6 C) (Oral)  Ht 5\' 4"  (1.626 m)  Wt 202 lb 0.6 oz (91.645 kg)  BMI 34.66 kg/m2  SpO2 92%  Physical Exam  Physical Exam  Constitutional: She is oriented to person, place, and time and well-developed, well-nourished, and in no distress. No distress.  HENT:  Head: Normocephalic and atraumatic.  Eyes: Conjunctivae are normal.  Neck: Neck  supple. No thyromegaly present.  Cardiovascular: Normal rate, regular rhythm and normal heart sounds.   No murmur heard. Pulmonary/Chest: Effort normal and breath sounds normal. She has no wheezes.  Abdominal: She exhibits no distension and no mass.  Musculoskeletal: She exhibits no edema.  Lymphadenopathy:    She has no cervical adenopathy.  Neurological: She is alert and oriented to person, place, and time.  Skin: Skin  is warm and dry. No rash noted. She is not diaphoretic.  Psychiatric: Memory, affect and judgment normal.    Lab Results  Component Value Date   TSH 0.143* 10/16/2013   Lab Results  Component Value Date   WBC 6.7 10/16/2013   HGB 12.6 10/16/2013   HCT 37.2 10/16/2013   MCV 85.9 10/16/2013   PLT 252 10/16/2013   Lab Results  Component Value Date   CREATININE 2.13* 10/16/2013   BUN 45* 10/16/2013   NA 139 10/16/2013   K 4.4 10/16/2013   CL 103 10/16/2013   CO2 28 10/16/2013   Lab Results  Component Value Date   ALT 17 10/16/2013   AST 18 10/16/2013   ALKPHOS 39 10/16/2013   BILITOT 0.4 10/16/2013   Lab Results  Component Value Date   CHOL 171 10/16/2013   Lab Results  Component Value Date   HDL 42 10/16/2013   Lab Results  Component Value Date   LDLCALC 83 10/16/2013   Lab Results  Component Value Date   TRIG 230* 10/16/2013   Lab Results  Component Value Date   CHOLHDL 4.1 10/16/2013     Assessment & Plan  UTI (lower urinary tract infection) macrobid and probiotics  H. pylori infection Tetracycline, Pepto Bismol, Flagyl and Omeprazole.   Pulmonary emboli Tolerating Coumadin. INR therapeutic but will be starting antibiotics so we will need to check again in a week.   CHRONIC OBSTRUCTIVE PULMONARY DISEASE, OXYGEN DEPENDENT stable

## 2013-10-19 NOTE — Assessment & Plan Note (Signed)
macrobid and probiotics

## 2013-10-19 NOTE — Assessment & Plan Note (Signed)
Tolerating Coumadin. INR therapeutic but will be starting antibiotics so we will need to check again in a week.

## 2013-10-19 NOTE — Assessment & Plan Note (Signed)
Tetracycline, Pepto Bismol, Flagyl and Omeprazole.

## 2013-10-19 NOTE — Assessment & Plan Note (Signed)
stable °

## 2013-10-20 ENCOUNTER — Telehealth: Payer: Self-pay

## 2013-10-20 ENCOUNTER — Telehealth: Payer: Self-pay | Admitting: Family Medicine

## 2013-10-20 MED ORDER — CLONAZEPAM 1 MG PO TABS: 1.0000 mg | ORAL_TABLET | Freq: Three times a day (TID) | ORAL | Status: DC | PRN

## 2013-10-20 MED ORDER — NITROFURANTOIN MONOHYD MACRO 100 MG PO CAPS: 100.0000 mg | ORAL_CAPSULE | Freq: Two times a day (BID) | ORAL | Status: DC

## 2013-10-20 NOTE — Telephone Encounter (Signed)
Refill clonazepam

## 2013-10-20 NOTE — Telephone Encounter (Signed)
Last RX was done on 08-15-13 quantity 30 with 0 refills.  RX sent

## 2013-10-20 NOTE — Addendum Note (Signed)
Addended by: Varney Daily on: 10/20/2013 11:45 AM   Modules accepted: Orders

## 2013-10-20 NOTE — Telephone Encounter (Signed)
Klonopin resent

## 2013-10-24 ENCOUNTER — Telehealth: Payer: Self-pay

## 2013-10-24 NOTE — Telephone Encounter (Signed)
FYI

## 2013-10-24 NOTE — Telephone Encounter (Signed)
Message copied by Varney Daily on Fri Oct 24, 2013  3:15 PM ------      Message from: Laren Everts      Created: Fri Oct 24, 2013  2:50 PM       Patient left message to let you know that she is not feeling well today and will come on Monday for her INR check :) ------

## 2013-10-28 ENCOUNTER — Ambulatory Visit: Payer: Medicare Other | Admitting: Neurology

## 2013-10-29 ENCOUNTER — Telehealth: Payer: Self-pay | Admitting: Family Medicine

## 2013-10-29 DIAGNOSIS — R791 Abnormal coagulation profile: Secondary | ICD-10-CM

## 2013-10-29 DIAGNOSIS — I2699 Other pulmonary embolism without acute cor pulmonale: Secondary | ICD-10-CM

## 2013-10-29 NOTE — Telephone Encounter (Signed)
Patient is still not feeling well, she is almost finished with the medication and thinks it is causing rectal bleeding. She is also experiencing  Weakness and back pain, please advise

## 2013-10-29 NOTE — Telephone Encounter (Signed)
Needs to hold her coumadin and come in today for stat PT/INR and CBC, if she is still feeling bad needs to be seen

## 2013-10-30 LAB — CBC
HCT: 40.7 % (ref 36.0–46.0)
Hemoglobin: 14.1 g/dL (ref 12.0–15.0)
MCH: 29.2 pg (ref 26.0–34.0)
MCHC: 34.6 g/dL (ref 30.0–36.0)
MCV: 84.3 fL (ref 78.0–100.0)
Platelets: 240 10*3/uL (ref 150–400)
RBC: 4.83 MIL/uL (ref 3.87–5.11)
RDW: 14.1 % (ref 11.5–15.5)
WBC: 7.5 10*3/uL (ref 4.0–10.5)

## 2013-10-30 LAB — PROTIME-INR
INR: 4.96 — AB (ref <1.50)
PROTHROMBIN TIME: 44.5 s — AB (ref 11.6–15.2)

## 2013-10-30 NOTE — Telephone Encounter (Signed)
Pt informed and she states she is in the lab right now.

## 2013-10-30 NOTE — Telephone Encounter (Signed)
[employer] case manager called on this. I told her that I would call patient of what Dr. Charlett Blake states. Patient states that she is feeling better. She says that she will come in today to do a stat PT/INR and CBC. Patient states that she does not take her coumadin until 6pm. She wants to know if she should take the coumadin tonight?

## 2013-10-30 NOTE — Telephone Encounter (Signed)
I ordered labs.  Please advise if pt should take coumadin tonight at 6?

## 2013-10-30 NOTE — Telephone Encounter (Signed)
As long as she does her labs today she can take her coumadin tonight.

## 2013-10-31 NOTE — Telephone Encounter (Signed)
Spoke with patient advised to hold coumadin and to seek care if any bleeding occurs. Will place order for PT/INR on 4/6 in chart and patient agrees to come in for the labs early that morning.

## 2013-11-03 ENCOUNTER — Telehealth: Payer: Self-pay | Admitting: *Deleted

## 2013-11-03 DIAGNOSIS — I2699 Other pulmonary embolism without acute cor pulmonale: Secondary | ICD-10-CM

## 2013-11-03 LAB — PROTIME-INR
INR: 1.57 — ABNORMAL HIGH (ref <1.50)
PROTHROMBIN TIME: 18.5 s — AB (ref 11.6–15.2)

## 2013-11-03 NOTE — Telephone Encounter (Signed)
Message copied by Ronny Flurry on Mon Nov 03, 2013  5:06 PM ------      Message from: Penni Homans A      Created: Mon Nov 03, 2013  5:03 PM       Restart Coumadin at previous dose and recheck PT/INR on 11/05/13. ------

## 2013-11-03 NOTE — Telephone Encounter (Signed)
Notified pt and she voices understanding. States she was previously taking 1/2 tablet on Monday and Friday and 1 whole tablet all other days. Pt will return for repeat PT/INR on Wednesday. Lab order entered.

## 2013-11-04 LAB — CBC
HCT: 37.1 % (ref 36.0–46.0)
Hemoglobin: 12.4 g/dL (ref 12.0–15.0)
MCH: 29 pg (ref 26.0–34.0)
MCHC: 33.4 g/dL (ref 30.0–36.0)
MCV: 86.9 fL (ref 78.0–100.0)
Platelets: 232 10*3/uL (ref 150–400)
RBC: 4.27 MIL/uL (ref 3.87–5.11)
RDW: 14.3 % (ref 11.5–15.5)
WBC: 6.3 10*3/uL (ref 4.0–10.5)

## 2013-11-05 ENCOUNTER — Ambulatory Visit (INDEPENDENT_AMBULATORY_CARE_PROVIDER_SITE_OTHER): Payer: Medicare Other | Admitting: Neurology

## 2013-11-05 ENCOUNTER — Encounter: Payer: Self-pay | Admitting: Neurology

## 2013-11-05 VITALS — BP 108/64 | HR 82 | Wt 198.2 lb

## 2013-11-05 DIAGNOSIS — R292 Abnormal reflex: Secondary | ICD-10-CM

## 2013-11-05 DIAGNOSIS — G250 Essential tremor: Secondary | ICD-10-CM

## 2013-11-05 DIAGNOSIS — R251 Tremor, unspecified: Secondary | ICD-10-CM

## 2013-11-05 DIAGNOSIS — G252 Other specified forms of tremor: Principal | ICD-10-CM

## 2013-11-05 HISTORY — DX: Essential tremor: G25.0

## 2013-11-05 HISTORY — DX: Tremor, unspecified: R25.1

## 2013-11-05 LAB — PROTIME-INR
INR: 1.16 (ref <1.50)
PROTHROMBIN TIME: 14.7 s (ref 11.6–15.2)

## 2013-11-05 NOTE — Patient Instructions (Signed)
1. 3 month follow up

## 2013-11-05 NOTE — Progress Notes (Signed)
Emily Mitchell was seen today in the movement disorders clinic for neurologic consultation at the request of Penni Homans, MD.  The consultation is for the evaluation of tremor.  Pt states that tremor started 10 years ago and it started in the R hand but seems that it is in the L hand now as well as in the mouth.  It is in the legs as well.  She states that she notes it most when she uses the limbs and it is worse when she is upset.  She has to use a straw with drinking.  She has trouble eating soup on a spoon and sometimes has to drink the soup.  Sometimes she has to eat peas with her fingers.  Her maternal grandma had tremor all her life.  Saw Dr. Erling Cruz years ago and dx with "tremor" per pt (no notes available) and no medication given.  Pt worried she has PD.   Specific Symptoms (other)  Voice: no changes Sleep: sleeps well as long as no bladder issues  Vivid Dreams:  no  Acting out dreams:  no Wet Pillows: yes Postural symptoms:  yes  Falls?  no (rarely - last was one year ago) Bradykinesia symptoms: slow movements and difficulty getting out of a chair (relates to bad knees) Loss of smell:  no (gotten better since quit smoking) Loss of taste: unknown (has lost her taste for chocolate) Urinary Incontinence:  no Difficulty Swallowing:  yes Handwriting, micrographia: yes Trouble with ADL's:  no  Trouble buttoning clothing: no Depression:  yes Memory changes:  yes (feels "off" some; drives without trouble and does own finances; lives with son for 4-5 years) Hallucinations:  no  visual distortions: yes N/V:  no Lightheaded:  yes  Syncope: yes but years ago (was pulling pants down to use bathroom) Diplopia:  no Dyskinesia:  no  Is on albuterol but only needs that about 1 time per month.  She is on klonopin for anxiety and is on it q hs (she was on it tid but it made her too sleepy).     ALLERGIES:   Allergies  Allergen Reactions  . Niaspan [Niacin Er] Nausea And Vomiting and  Swelling    Swelling in mouth  . Pantoprazole     Mouth sores  . Bupropion Other (See Comments)    Uncontrollable shakes  . Penicillins Hives  . Statins   . Sulfonamide Derivatives Hives    CURRENT MEDICATIONS:  Current Outpatient Prescriptions on File Prior to Visit  Medication Sig Dispense Refill  . albuterol (PROVENTIL HFA;VENTOLIN HFA) 108 (90 BASE) MCG/ACT inhaler Inhale 2 puffs into the lungs every 4 (four) hours as needed for wheezing.      . B Complex Vitamins (B-COMPLEX/B-12 TR PO) Take 1 tablet by mouth daily.       . Bismuth Subsalicylate (PEPTO-BISMOL) 262 MG TABS Take 2 tablets (524 mg total) by mouth 4 (four) times daily. X 14 days  112 each  0  . calcium citrate-vitamin D (CITRACAL+D) 315-200 MG-UNIT per tablet Take 1 tablet by mouth daily.       . Cholecalciferol (D3 MAXIMUM STRENGTH) 5000 UNITS capsule Take 5,000 Units by mouth daily.      . Choline Fenofibrate (FENOFIBRIC ACID) 135 MG CPDR Take 1 tablet by mouth daily.  30 capsule  5  . citalopram (CELEXA) 40 MG tablet Take 1 tablet (40 mg total) by mouth daily.  90 tablet  1  . clonazePAM (KLONOPIN) 1 MG  tablet Take 1 tablet (1 mg total) by mouth 3 (three) times daily as needed for anxiety.  30 tablet  0  . ezetimibe (ZETIA) 10 MG tablet Take 1 tablet (10 mg total) by mouth daily.  30 tablet  5  . Fluticasone-Salmeterol (ADVAIR) 250-50 MCG/DOSE AEPB Inhale 1 puff into the lungs every 12 (twelve) hours.      Marland Kitchen levalbuterol (XOPENEX) 1.25 MG/0.5ML nebulizer solution Take 1 ampule by nebulization every 4 (four) hours as needed for wheezing.      Marland Kitchen levothyroxine (SYNTHROID, LEVOTHROID) 137 MCG tablet Take 1 tablet (137 mcg total) by mouth daily before breakfast.  30 tablet  5  . montelukast (SINGULAIR) 10 MG tablet Take 10 mg by mouth at bedtime.       . Multiple Vitamins-Minerals (PRESERVISION AREDS PO) Take 10 g by mouth every morning.      Marland Kitchen NEXIUM 40 MG capsule Take 40 mg by mouth 2 (two) times daily.       .  nitroGLYCERIN (NITROSTAT) 0.4 MG SL tablet Place 1 tablet (0.4 mg total) under the tongue every 5 (five) minutes as needed for chest pain.  25 tablet  3  . telmisartan-hydrochlorothiazide (MICARDIS HCT) 80-12.5 MG per tablet Take 1 tablet by mouth daily. Marland Kitchen5 tablet daily      . warfarin (COUMADIN) 5 MG tablet Take 1/2 tablet on  Monday and Friday and 1 tablet all other days.       No current facility-administered medications on file prior to visit.    PAST MEDICAL HISTORY:   Past Medical History  Diagnosis Date  . Carotid artery occlusion   . Hyperlipidemia   . Hypertension   . COPD (chronic obstructive pulmonary disease)   . Chicken pox as a child  . Mumps 64 yrs old  . GERD (gastroesophageal reflux disease)   . Asthma   . Arthritis   . Hypothyroidism   . Chronic kidney disease     Bright's Disease at age 64   . Tremor   . CHF (congestive heart failure)   . Anxiety and depression 12/14/2008    Qualifier: Diagnosis of  By: [name] LPN, Almyra Free    . Pulmonary emboli 05/26/2013  . H. pylori infection 10/19/2013  . UTI (lower urinary tract infection) 10/19/2013    PAST SURGICAL HISTORY:   Past Surgical History  Procedure Laterality Date  . Carotid endarterectomy Right 05/10/07    cea  . Knees replaced  2005 and 2011    both knees  . Wisdom tooth extraction    . Abdominal hysterectomy  1984  . Cataract extraction Left     SOCIAL HISTORY:   History   Social History  . Marital Status: Divorced    Spouse Name: N/A    Number of Children: 2  . Years of Education: N/A   Occupational History  . Not on file.   Social History Main Topics  . Smoking status: Former Smoker -- 2.00 packs/day for 30 years    Types: Cigarettes    Quit date: 12/03/1994  . Smokeless tobacco: Never Used  . Alcohol Use: No  . Drug Use: No  . Sexual Activity: No   Other Topics Concern  . Not on file   Social History Narrative  . No narrative on file    FAMILY HISTORY:   Family Status    Relation Status Death Age  . Mother Deceased 70    CHF, renal failure  . Sister Alive   . Father  Deceased 48    unknown cause of death  . Daughter Alive     healthy  . Son Alive     healthy  . Maternal Grandmother Deceased   . Maternal Grandfather Deceased   . Paternal Grandmother Deceased   . Paternal Grandfather Deceased   . Sister Alive   . Sister Alive     ROS:  A complete 10 system review of systems was obtained and was unremarkable apart from what is mentioned above.  PHYSICAL EXAMINATION:    VITALS:   Filed Vitals:   11/05/13 1155  BP: 108/64  Pulse: 82  Weight: 198 lb 3 oz (89.897 kg)  SpO2: 94%    GEN:  The patient appears stated age and is in NAD. HEENT:  Normocephalic, atraumatic.  The mucous membranes are moist. The superficial temporal arteries are without ropiness or tenderness. CV:  RRR Lungs:  CTAB Neck/HEME:  There are no carotid bruits bilaterally.  Neurological examination:  Orientation: The patient is alert and oriented x3. Fund of knowledge is appropriate.  Recent and remote memory are intact.  Attention and concentration are normal.    Able to name objects and repeat phrases. Cranial nerves: There is good facial symmetry. Pupils are equal round and reactive to light bilaterally. Fundoscopic exam reveals clear margins bilaterally. Extraocular muscles are intact. The visual fields are full to confrontational testing. The speech is fluent and clear. Soft palate rises symmetrically and there is no tongue deviation. Hearing is intact to conversational tone. Sensation: Sensation is intact to light and pinprick throughout (facial, trunk, extremities). Vibration is intact at the bilateral big toe. There is no extinction with double simultaneous stimulation. There is no sensory dermatomal level identified. Motor: Strength is 5/5 in the bilateral upper and lower extremities.   Shoulder shrug is equal and symmetric.  There is no pronator drift. Deep tendon  reflexes: Deep tendon reflexes are 3/4 at the bilateral biceps, triceps, brachioradialis, patella and achilles. No ankle clonus.  Is a Hoffmans response on the R.  Plantar responses are downgoing bilaterally.  Movement examination: Tone: There is normal tone in the bilateral upper extremities.  The tone in the lower extremities is normal.  Abnormal movements: There is a chin tremor.  There is a RUE resting tremor.  There is a postural tremor bilaterally, R>L Coordination:  There is no decremation with RAM's with any form of RAM's including alternating supination and pronation of the forearm, hand opening and closing, finger taps, heel taps and toe taps. Gait and Station: The patient has mild difficulty arising out of a deep-seated chair without the use of the hands. It takes 2 attempts.  The patient's stride length is slightly decreased.      ASSESSMENT/PLAN:  1.  Essential tremor.  -The pt has longstanding essential tremor that is somewhat asymmetric and is now manifesting as resting tremor.  There is no evidence of a neurodegenerative condition such as Parkinson's disease.  She and I discussed this today.  We discussed various treatments, ranging from doing nothing to medications to surgical options.  She opted to hold on treatment.  We did discuss that she could not have beta blockers because of her COPD and that we would have to cautiously use primidone because of her coumadin.  She was just relieved to hear that she did not have PD. 2.  Hyperreflexia  -She has no evidence of myelopathy but the hyperreflexia certainly could be coming from degenerative changes in the neck.  In the  end, she decided that she would not have any surgery right now no matter what the findings so I decided not to image.  IF neck pain becomes a big issue, I would suggest that this is re-visited. 3.  She will f/u with me on an as needed basis.  Pt education provided today.

## 2013-11-10 ENCOUNTER — Other Ambulatory Visit: Payer: Self-pay | Admitting: Family Medicine

## 2013-11-17 ENCOUNTER — Other Ambulatory Visit: Payer: Self-pay | Admitting: Family Medicine

## 2013-11-19 ENCOUNTER — Telehealth: Payer: Self-pay | Admitting: *Deleted

## 2013-11-19 NOTE — Telephone Encounter (Signed)
Ok to print 90 days with 90 tabs no rf

## 2013-11-19 NOTE — Telephone Encounter (Signed)
Received message from pt requesting refill of clonazepam. Pt has been getting #30 per month. Pt would like to get #90 for 90 days.  Last Rx printed 10/20/13, #30 x no refills.  Please advise.

## 2013-11-20 MED ORDER — CLONAZEPAM 1 MG PO TABS: 1.0000 mg | ORAL_TABLET | Freq: Three times a day (TID) | ORAL | Status: DC | PRN

## 2013-11-20 NOTE — Telephone Encounter (Signed)
rx sent

## 2013-11-26 ENCOUNTER — Other Ambulatory Visit: Payer: Self-pay

## 2013-11-26 DIAGNOSIS — I2699 Other pulmonary embolism without acute cor pulmonale: Secondary | ICD-10-CM

## 2013-12-12 ENCOUNTER — Other Ambulatory Visit: Payer: Self-pay | Admitting: Family Medicine

## 2013-12-12 ENCOUNTER — Ambulatory Visit (INDEPENDENT_AMBULATORY_CARE_PROVIDER_SITE_OTHER): Payer: Medicare Other | Admitting: Family Medicine

## 2013-12-12 ENCOUNTER — Encounter: Payer: Self-pay | Admitting: Family Medicine

## 2013-12-12 ENCOUNTER — Telehealth: Payer: Self-pay | Admitting: Family Medicine

## 2013-12-12 VITALS — BP 102/60 | HR 76 | Temp 98.5°F | Ht 64.0 in | Wt 199.0 lb

## 2013-12-12 DIAGNOSIS — T7840XA Allergy, unspecified, initial encounter: Secondary | ICD-10-CM

## 2013-12-12 DIAGNOSIS — F419 Anxiety disorder, unspecified: Secondary | ICD-10-CM

## 2013-12-12 DIAGNOSIS — I10 Essential (primary) hypertension: Secondary | ICD-10-CM

## 2013-12-12 DIAGNOSIS — M549 Dorsalgia, unspecified: Secondary | ICD-10-CM

## 2013-12-12 DIAGNOSIS — E785 Hyperlipidemia, unspecified: Secondary | ICD-10-CM

## 2013-12-12 DIAGNOSIS — K219 Gastro-esophageal reflux disease without esophagitis: Secondary | ICD-10-CM

## 2013-12-12 DIAGNOSIS — I2699 Other pulmonary embolism without acute cor pulmonale: Secondary | ICD-10-CM

## 2013-12-12 DIAGNOSIS — F329 Major depressive disorder, single episode, unspecified: Secondary | ICD-10-CM

## 2013-12-12 DIAGNOSIS — N289 Disorder of kidney and ureter, unspecified: Secondary | ICD-10-CM

## 2013-12-12 DIAGNOSIS — N189 Chronic kidney disease, unspecified: Secondary | ICD-10-CM

## 2013-12-12 DIAGNOSIS — E079 Disorder of thyroid, unspecified: Secondary | ICD-10-CM

## 2013-12-12 DIAGNOSIS — F32A Depression, unspecified: Secondary | ICD-10-CM

## 2013-12-12 DIAGNOSIS — I509 Heart failure, unspecified: Secondary | ICD-10-CM

## 2013-12-12 DIAGNOSIS — R609 Edema, unspecified: Secondary | ICD-10-CM

## 2013-12-12 DIAGNOSIS — J449 Chronic obstructive pulmonary disease, unspecified: Secondary | ICD-10-CM

## 2013-12-12 DIAGNOSIS — E039 Hypothyroidism, unspecified: Secondary | ICD-10-CM

## 2013-12-12 DIAGNOSIS — F341 Dysthymic disorder: Secondary | ICD-10-CM

## 2013-12-12 LAB — RENAL FUNCTION PANEL
Albumin: 4.2 g/dL (ref 3.5–5.2)
BUN: 31 mg/dL — ABNORMAL HIGH (ref 6–23)
CALCIUM: 9.8 mg/dL (ref 8.4–10.5)
CO2: 26 meq/L (ref 19–32)
Chloride: 104 mEq/L (ref 96–112)
Creat: 2.45 mg/dL — ABNORMAL HIGH (ref 0.50–1.10)
Glucose, Bld: 75 mg/dL (ref 70–99)
POTASSIUM: 4.5 meq/L (ref 3.5–5.3)
Phosphorus: 3.4 mg/dL (ref 2.3–4.6)
Sodium: 140 mEq/L (ref 135–145)

## 2013-12-12 LAB — TSH: TSH: 0.584 u[IU]/mL (ref 0.350–4.500)

## 2013-12-12 LAB — T4, FREE: FREE T4: 1.46 ng/dL (ref 0.80–1.80)

## 2013-12-12 MED ORDER — WARFARIN SODIUM 5 MG PO TABS: ORAL_TABLET | ORAL | Status: DC

## 2013-12-12 MED ORDER — FENOFIBRIC ACID 135 MG PO CPDR: 1.0000 | DELAYED_RELEASE_CAPSULE | Freq: Every day | ORAL | Status: DC

## 2013-12-12 MED ORDER — FUROSEMIDE 20 MG PO TABS: 20.0000 mg | ORAL_TABLET | Freq: Every day | ORAL | Status: DC | PRN

## 2013-12-12 MED ORDER — EZETIMIBE 10 MG PO TABS: 10.0000 mg | ORAL_TABLET | Freq: Every day | ORAL | Status: DC

## 2013-12-12 MED ORDER — CLONAZEPAM 1 MG PO TABS: 1.0000 mg | ORAL_TABLET | Freq: Three times a day (TID) | ORAL | Status: DC | PRN

## 2013-12-12 MED ORDER — MONTELUKAST SODIUM 10 MG PO TABS: 10.0000 mg | ORAL_TABLET | Freq: Every day | ORAL | Status: DC

## 2013-12-12 MED ORDER — CITALOPRAM HYDROBROMIDE 20 MG PO TABS: 20.0000 mg | ORAL_TABLET | Freq: Every day | ORAL | Status: DC

## 2013-12-12 MED ORDER — TRAMADOL HCL 50 MG PO TABS: 50.0000 mg | ORAL_TABLET | Freq: Three times a day (TID) | ORAL | Status: DC | PRN

## 2013-12-12 MED ORDER — LEVALBUTEROL HCL 1.25 MG/0.5ML IN NEBU: 1.2500 mg | INHALATION_SOLUTION | RESPIRATORY_TRACT | Status: DC | PRN

## 2013-12-12 MED ORDER — ESOMEPRAZOLE MAGNESIUM 40 MG PO CPDR: 40.0000 mg | DELAYED_RELEASE_CAPSULE | Freq: Every day | ORAL | Status: DC

## 2013-12-12 MED ORDER — LEVOTHYROXINE SODIUM 137 MCG PO TABS: 137.0000 ug | ORAL_TABLET | Freq: Every day | ORAL | Status: DC

## 2013-12-12 MED ORDER — TELMISARTAN-HCTZ 80-12.5 MG PO TABS: 1.0000 | ORAL_TABLET | Freq: Every day | ORAL | Status: DC

## 2013-12-12 MED ORDER — FLUTICASONE-SALMETEROL 250-50 MCG/DOSE IN AEPB: 1.0000 | INHALATION_SPRAY | Freq: Two times a day (BID) | RESPIRATORY_TRACT | Status: DC

## 2013-12-12 NOTE — Progress Notes (Signed)
Pre visit review using our clinic review tool, if applicable. No additional management support is needed unless otherwise documented below in the visit note. 

## 2013-12-12 NOTE — Patient Instructions (Addendum)
Add Zantac at bedtime if needed   Can stop Coumadin on 02/03/14 and start ECAspirin 81 tabs 2 tabs daily x 30 days then 1 tab daily  Gastroesophageal Reflux Disease, Adult Gastroesophageal reflux disease (GERD) happens when acid from your stomach flows up into the esophagus. When acid comes in contact with the esophagus, the acid causes soreness (inflammation) in the esophagus. Over time, GERD may create small holes (ulcers) in the lining of the esophagus. CAUSES   Increased body weight. This puts pressure on the stomach, making acid rise from the stomach into the esophagus.  Smoking. This increases acid production in the stomach.  Drinking alcohol. This causes decreased pressure in the lower esophageal sphincter (valve or ring of muscle between the esophagus and stomach), allowing acid from the stomach into the esophagus.  Late evening meals and a full stomach. This increases pressure and acid production in the stomach.  A malformed lower esophageal sphincter. Sometimes, no cause is found. SYMPTOMS   Burning pain in the lower part of the mid-chest behind the breastbone and in the mid-stomach area. This may occur twice a week or more often.  Trouble swallowing.  Sore throat.  Dry cough.  Asthma-like symptoms including chest tightness, shortness of breath, or wheezing. DIAGNOSIS  Your caregiver may be able to diagnose GERD based on your symptoms. In some cases, X-rays and other tests may be done to check for complications or to check the condition of your stomach and esophagus. TREATMENT  Your caregiver may recommend over-the-counter or prescription medicines to help decrease acid production. Ask your caregiver before starting or adding any new medicines.  HOME CARE INSTRUCTIONS   Change the factors that you can control. Ask your caregiver for guidance concerning weight loss, quitting smoking, and alcohol consumption.  Avoid foods and drinks that make your symptoms worse, such  as:  Caffeine or alcoholic drinks.  Chocolate.  Peppermint or mint flavorings.  Garlic and onions.  Spicy foods.  Citrus fruits, such as oranges, lemons, or limes.  Tomato-based foods such as sauce, chili, salsa, and pizza.  Fried and fatty foods.  Avoid lying down for the 3 hours prior to your bedtime or prior to taking a nap.  Eat small, frequent meals instead of large meals.  Wear loose-fitting clothing. Do not wear anything tight around your waist that causes pressure on your stomach.  Raise the head of your bed 6 to 8 inches with wood blocks to help you sleep. Extra pillows will not help.  Only take over-the-counter or prescription medicines for pain, discomfort, or fever as directed by your caregiver.  Do not take aspirin, ibuprofen, or other nonsteroidal anti-inflammatory drugs (NSAIDs). SEEK IMMEDIATE MEDICAL CARE IF:   You have pain in your arms, neck, jaw, teeth, or back.  Your pain increases or changes in intensity or duration.  You develop nausea, vomiting, or sweating (diaphoresis).  You develop shortness of breath, or you faint.  Your vomit is green, yellow, black, or looks like coffee grounds or blood.  Your stool is red, bloody, or black. These symptoms could be signs of other problems, such as heart disease, gastric bleeding, or esophageal bleeding. MAKE SURE YOU:   Understand these instructions.  Will watch your condition.  Will get help right away if you are not doing well or get worse. Document Released: 04/26/2005 Document Revised: 10/09/2011 Document Reviewed: 02/03/2011 [facility] Patient Information 2014 [place], [place].

## 2013-12-12 NOTE — Telephone Encounter (Signed)
Relevant patient education mailed to patient.  

## 2013-12-13 LAB — PROTIME-INR
INR: 1.63 — ABNORMAL HIGH (ref <1.50)
Prothrombin Time: 19 seconds — ABNORMAL HIGH (ref 11.6–15.2)

## 2013-12-14 ENCOUNTER — Encounter: Payer: Self-pay | Admitting: Family Medicine

## 2013-12-14 NOTE — Assessment & Plan Note (Signed)
On Levothyroxine, continue to monitor 

## 2013-12-14 NOTE — Assessment & Plan Note (Signed)
Avoid offending foods, start probiotics. Do not eat large meals in late evening and consider raising head of bed.  

## 2013-12-14 NOTE — Progress Notes (Signed)
Patient ID: Emily Mitchell, female   DOB: [birth year], 68 y.o.   MRN: [id number] CAOIMHE LYCAN [id number] November 03, 1945 12/14/2013      Progress Note-Follow Up  Subjective  Chief Complaint  Chief Complaint  Patient presents with  . Follow-up    8 week    HPI  Patient is a 68 year old female in today for routine medical care. She is doing fairly well. She is established with nephrology but there's been no major change in her meds. No recent illness. Is eating well. Denies CP/palp/SOB/HA/congestion/fevers/GI or GU c/o. Taking meds as prescribed  Past Medical History  Diagnosis Date  . Carotid artery occlusion   . Hyperlipidemia   . Hypertension   . COPD (chronic obstructive pulmonary disease)   . Chicken pox as a child  . Mumps 58 yrs old  . GERD (gastroesophageal reflux disease)   . Asthma   . Arthritis   . Hypothyroidism   . Chronic kidney disease     Bright's Disease at age 76   . Tremor   . CHF (congestive heart failure)   . Anxiety and depression 12/14/2008    Qualifier: Diagnosis of  By: [name] LPN, Almyra Free    . Pulmonary emboli 05/26/2013  . H. pylori infection 10/19/2013  . UTI (lower urinary tract infection) 10/19/2013    Past Surgical History  Procedure Laterality Date  . Carotid endarterectomy Right 05/10/07    cea  . Knees replaced  2005 and 2011    both knees  . Wisdom tooth extraction    . Abdominal hysterectomy  1984  . Cataract extraction Left     Family History  Problem Relation Age of Onset  . Stroke Mother     mini stroke  . Kidney disease Mother   . Heart failure Mother   . Hypertension Mother   . Diabetes Sister     type 2  . Hyperlipidemia Sister     History   Social History  . Marital Status: Divorced    Spouse Name: N/A    Number of Children: 2  . Years of Education: N/A   Occupational History  . Not on file.   Social History Main Topics  . Smoking status: Former Smoker -- 2.00 packs/day for 30 years    Types: Cigarettes   Quit date: 12/03/1994  . Smokeless tobacco: Never Used  . Alcohol Use: No  . Drug Use: No  . Sexual Activity: No   Other Topics Concern  . Not on file   Social History Narrative  . No narrative on file    Current Outpatient Prescriptions on File Prior to Visit  Medication Sig Dispense Refill  . albuterol (PROVENTIL HFA;VENTOLIN HFA) 108 (90 BASE) MCG/ACT inhaler Inhale 2 puffs into the lungs every 4 (four) hours as needed for wheezing.      . B Complex Vitamins (B-COMPLEX/B-12 TR PO) Take 1 tablet by mouth daily.       . Bismuth Subsalicylate (PEPTO-BISMOL) 262 MG TABS Take 2 tablets (524 mg total) by mouth 4 (four) times daily. X 14 days  112 each  0  . calcium citrate-vitamin D (CITRACAL+D) 315-200 MG-UNIT per tablet Take 1 tablet by mouth daily.       . Cholecalciferol (D3 MAXIMUM STRENGTH) 5000 UNITS capsule Take 5,000 Units by mouth daily.      . citalopram (CELEXA) 40 MG tablet TAKE ONE TABLET BY MOUTH ONCE DAILY  90 tablet  0  . Multiple Vitamins-Minerals (PRESERVISION AREDS  PO) Take 10 g by mouth every morning.      . nitroGLYCERIN (NITROSTAT) 0.4 MG SL tablet Place 1 tablet (0.4 mg total) under the tongue every 5 (five) minutes as needed for chest pain.  25 tablet  3   No current facility-administered medications on file prior to visit.    Allergies  Allergen Reactions  . Niaspan [Niacin Er] Nausea And Vomiting and Swelling    Swelling in mouth  . Pantoprazole     Mouth sores  . Bupropion Other (See Comments)    Uncontrollable shakes  . Penicillins Hives  . Statins   . Sulfonamide Derivatives Hives    Review of Systems  Review of Systems  Constitutional: Negative for fever and malaise/fatigue.  HENT: Negative for congestion.   Eyes: Negative for discharge.  Respiratory: Negative for shortness of breath.   Cardiovascular: Negative for chest pain, palpitations and leg swelling.  Gastrointestinal: Negative for nausea, abdominal pain and diarrhea.  Genitourinary:  Negative for dysuria.  Musculoskeletal: Negative for falls.  Skin: Negative for rash.  Neurological: Negative for loss of consciousness and headaches.  Endo/Heme/Allergies: Negative for polydipsia.  Psychiatric/Behavioral: Negative for depression and suicidal ideas. The patient is not nervous/anxious and does not have insomnia.     Objective  BP 102/60  Pulse 76  Temp(Src) 98.5 F (36.9 C) (Oral)  Ht 5\' 4"  (1.626 m)  Wt 199 lb 0.6 oz (90.284 kg)  BMI 34.15 kg/m2  SpO2 98%  Physical Exam  Physical Exam  Constitutional: She is oriented to person, place, and time and well-developed, well-nourished, and in no distress. No distress.  HENT:  Head: Normocephalic and atraumatic.  Eyes: Conjunctivae are normal.  Neck: Neck supple. No thyromegaly present.  Cardiovascular: Normal rate, regular rhythm and normal heart sounds.   No murmur heard. Pulmonary/Chest: Effort normal and breath sounds normal. She has no wheezes.  Abdominal: She exhibits no distension and no mass.  Musculoskeletal: She exhibits no edema.  Lymphadenopathy:    She has no cervical adenopathy.  Neurological: She is alert and oriented to person, place, and time.  Skin: Skin is warm and dry. No rash noted. She is not diaphoretic.  Psychiatric: Memory, affect and judgment normal.    Lab Results  Component Value Date   TSH 0.584 12/12/2013   Lab Results  Component Value Date   WBC 6.3 11/03/2013   HGB 12.4 11/03/2013   HCT 37.1 11/03/2013   MCV 86.9 11/03/2013   PLT 232 11/03/2013   Lab Results  Component Value Date   CREATININE 2.45* 12/12/2013   BUN 31* 12/12/2013   NA 140 12/12/2013   K 4.5 12/12/2013   CL 104 12/12/2013   CO2 26 12/12/2013   Lab Results  Component Value Date   ALT 17 10/16/2013   AST 18 10/16/2013   ALKPHOS 39 10/16/2013   BILITOT 0.4 10/16/2013   Lab Results  Component Value Date   CHOL 171 10/16/2013   Lab Results  Component Value Date   HDL 42 10/16/2013   Lab Results  Component Value  Date   LDLCALC 83 10/16/2013   Lab Results  Component Value Date   TRIG 230* 10/16/2013   Lab Results  Component Value Date   CHOLHDL 4.1 10/16/2013     Assessment & Plan  GERD Avoid offending foods, start probiotics. Do not eat large meals in late evening and consider raising head of bed.   Hypothyroidism On Levothyroxine, continue to monitor  Chronic kidney disease Following  with nephrology now.  Pulmonary emboli Tolerating Coumadin  Other and unspecified hyperlipidemia Encouraged heart healthy diet, increase exercise, avoid trans fats, consider a krill oil cap daily  Anxiety and depression Well treated on current meds

## 2013-12-14 NOTE — Assessment & Plan Note (Signed)
Tolerating Coumadin 

## 2013-12-14 NOTE — Assessment & Plan Note (Signed)
Following with nephrology now.

## 2013-12-14 NOTE — Assessment & Plan Note (Signed)
Well treated on current meds

## 2013-12-14 NOTE — Assessment & Plan Note (Signed)
Encouraged heart healthy diet, increase exercise, avoid trans fats, consider a krill oil cap daily 

## 2013-12-15 ENCOUNTER — Telehealth: Payer: Self-pay

## 2013-12-15 DIAGNOSIS — I2699 Other pulmonary embolism without acute cor pulmonale: Secondary | ICD-10-CM

## 2013-12-15 NOTE — Telephone Encounter (Signed)
Lab order placed per md

## 2013-12-16 ENCOUNTER — Telehealth: Payer: Self-pay

## 2013-12-16 ENCOUNTER — Other Ambulatory Visit: Payer: Self-pay

## 2013-12-16 DIAGNOSIS — I10 Essential (primary) hypertension: Secondary | ICD-10-CM

## 2013-12-16 DIAGNOSIS — I2699 Other pulmonary embolism without acute cor pulmonale: Secondary | ICD-10-CM

## 2013-12-16 DIAGNOSIS — E039 Hypothyroidism, unspecified: Secondary | ICD-10-CM

## 2013-12-16 MED ORDER — TELMISARTAN-HCTZ 80-12.5 MG PO TABS: 0.5000 | ORAL_TABLET | Freq: Every day | ORAL | Status: DC

## 2013-12-16 MED ORDER — LEVOTHYROXINE SODIUM 137 MCG PO TABS: 137.0000 ug | ORAL_TABLET | Freq: Every day | ORAL | Status: DC

## 2013-12-16 NOTE — Telephone Encounter (Signed)
Received RX request dated 12-13-13 for Levothyroxine

## 2013-12-23 ENCOUNTER — Ambulatory Visit (HOSPITAL_COMMUNITY)
Admission: RE | Admit: 2013-12-23 | Discharge: 2013-12-23 | Disposition: A | Discharge status: Home / Self Care | Payer: Medicare Other | Source: Ambulatory Visit | Attending: Vascular Surgery | Admitting: Vascular Surgery

## 2013-12-23 DIAGNOSIS — I6529 Occlusion and stenosis of unspecified carotid artery: Secondary | ICD-10-CM | POA: Insufficient documentation

## 2013-12-23 DIAGNOSIS — Z48812 Encounter for surgical aftercare following surgery on the circulatory system: Secondary | ICD-10-CM | POA: Insufficient documentation

## 2014-01-06 ENCOUNTER — Encounter: Payer: Self-pay | Admitting: Family Medicine

## 2014-01-06 LAB — HM MAMMOGRAPHY: HM Mammogram: NORMAL

## 2014-01-13 LAB — PROTIME-INR
INR: 2.3 — AB (ref <1.50)
Prothrombin Time: 24.7 seconds — ABNORMAL HIGH (ref 11.6–15.2)

## 2014-02-04 ENCOUNTER — Encounter: Payer: Self-pay | Admitting: Neurology

## 2014-02-04 ENCOUNTER — Ambulatory Visit (INDEPENDENT_AMBULATORY_CARE_PROVIDER_SITE_OTHER): Payer: Medicare Other | Admitting: Neurology

## 2014-02-04 VITALS — BP 102/54 | HR 84 | Resp 20 | Ht 64.0 in | Wt 199.0 lb

## 2014-02-04 DIAGNOSIS — M542 Cervicalgia: Secondary | ICD-10-CM

## 2014-02-04 DIAGNOSIS — G250 Essential tremor: Secondary | ICD-10-CM

## 2014-02-04 DIAGNOSIS — R292 Abnormal reflex: Secondary | ICD-10-CM

## 2014-02-04 DIAGNOSIS — G252 Other specified forms of tremor: Principal | ICD-10-CM

## 2014-02-04 NOTE — Progress Notes (Signed)
Emily Mitchell was seen today in the movement disorders clinic for neurologic consultation at the request of Penni Homans, MD.  The consultation is for the evaluation of tremor.  Pt states that tremor started 10 years ago and it started in the R hand but seems that it is in the L hand now as well as in the mouth.  It is in the legs as well.  She states that she notes it most when she uses the limbs and it is worse when she is upset.  She has to use a straw with drinking.  She has trouble eating soup on a spoon and sometimes has to drink the soup.  Sometimes she has to eat peas with her fingers.  Her maternal grandma had tremor all her life.  Saw Dr. Erling Cruz years ago and dx with "tremor" per pt (no notes available) and no medication given.  Pt worried she has PD.  02/04/14 update:  Pt is following up today.  She is doing about the same.  She feels that tremor is actually doing well today.  She does not want any medication for it.  She does complain about some neck pain, but it is not disabling.  She has had no falls.  No urinary incontinence.  No loss of bowel control.  She thought she was able to discontinue her Coumadin this month, but that has changed.  She does have a history of pulmonary embolism.  ALLERGIES:   Allergies  Allergen Reactions  . Niaspan [Niacin Er] Nausea And Vomiting and Swelling    Swelling in mouth  . Pantoprazole     Mouth sores  . Bupropion Other (See Comments)    Uncontrollable shakes  . Penicillins Hives  . Statins   . Sulfonamide Derivatives Hives    CURRENT MEDICATIONS:  Current Outpatient Prescriptions on File Prior to Visit  Medication Sig Dispense Refill  . albuterol (PROVENTIL HFA;VENTOLIN HFA) 108 (90 BASE) MCG/ACT inhaler Inhale 2 puffs into the lungs every 4 (four) hours as needed for wheezing.      . calcium citrate-vitamin D (CITRACAL+D) 315-200 MG-UNIT per tablet Take 1 tablet by mouth daily.       . Cholecalciferol (D3 MAXIMUM STRENGTH) 5000 UNITS  capsule Take 5,000 Units by mouth daily.      . citalopram (CELEXA) 40 MG tablet TAKE ONE TABLET BY MOUTH ONCE DAILY  90 tablet  0  . clonazePAM (KLONOPIN) 1 MG tablet Take 1 tablet (1 mg total) by mouth 3 (three) times daily as needed for anxiety.  90 tablet  2  . esomeprazole (NEXIUM) 40 MG capsule Take 1 capsule (40 mg total) by mouth daily at 12 noon.  90 capsule  3  . ezetimibe (ZETIA) 10 MG tablet Take 1 tablet (10 mg total) by mouth daily.  90 tablet  3  . Fluticasone-Salmeterol (ADVAIR) 250-50 MCG/DOSE AEPB Inhale 1 puff into the lungs every 12 (twelve) hours.  180 each  3  . furosemide (LASIX) 20 MG tablet Take 1 tablet (20 mg total) by mouth daily as needed for fluid or edema (weight gain >3# in 24, sob).  30 tablet  3  . levalbuterol (XOPENEX) 1.25 MG/0.5ML nebulizer solution Take 1.25 mg by nebulization every 4 (four) hours as needed for wheezing.  30 each  1  . levothyroxine (SYNTHROID, LEVOTHROID) 137 MCG tablet Take 1 tablet (137 mcg total) by mouth daily before breakfast.  90 tablet  3  . montelukast (SINGULAIR) 10 MG  tablet Take 1 tablet (10 mg total) by mouth at bedtime.  30 tablet  0  . Multiple Vitamins-Minerals (PRESERVISION AREDS PO) Take 10 g by mouth every morning.      Marland Kitchen telmisartan-hydrochlorothiazide (MICARDIS HCT) 80-12.5 MG per tablet Take 0.5 tablets by mouth daily. Marland Kitchen5 tablet daily  45 tablet  3  . traMADol (ULTRAM) 50 MG tablet Take 1 tablet (50 mg total) by mouth every 8 (eight) hours as needed for moderate pain or severe pain.  90 tablet  0  . warfarin (COUMADIN) 5 MG tablet 5 mg tab daily or alter as directed  90 tablet  1  . nitroGLYCERIN (NITROSTAT) 0.4 MG SL tablet Place 1 tablet (0.4 mg total) under the tongue every 5 (five) minutes as needed for chest pain.  25 tablet  3   No current facility-administered medications on file prior to visit.    PAST MEDICAL HISTORY:   Past Medical History  Diagnosis Date  . Carotid artery occlusion   . Hyperlipidemia   .  Hypertension   . COPD (chronic obstructive pulmonary disease)   . Chicken pox as a child  . Mumps 70 yrs old  . GERD (gastroesophageal reflux disease)   . Asthma   . Arthritis   . Hypothyroidism   . Chronic kidney disease     Bright's Disease at age 22   . Tremor   . CHF (congestive heart failure)   . Anxiety and depression 12/14/2008    Qualifier: Diagnosis of  By: [name] LPN, Almyra Free    . Pulmonary emboli 05/26/2013  . H. pylori infection 10/19/2013  . UTI (lower urinary tract infection) 10/19/2013    PAST SURGICAL HISTORY:   Past Surgical History  Procedure Laterality Date  . Carotid endarterectomy Right 05/10/07    cea  . Knees replaced  2005 and 2011    both knees  . Wisdom tooth extraction    . Abdominal hysterectomy  1984  . Cataract extraction Left     SOCIAL HISTORY:   History   Social History  . Marital Status: Divorced    Spouse Name: N/A    Number of Children: 2  . Years of Education: N/A   Occupational History  . Not on file.   Social History Main Topics  . Smoking status: Former Smoker -- 2.00 packs/day for 30 years    Types: Cigarettes    Quit date: 12/03/1994  . Smokeless tobacco: Never Used  . Alcohol Use: No  . Drug Use: No  . Sexual Activity: No   Other Topics Concern  . Not on file   Social History Narrative  . No narrative on file    FAMILY HISTORY:   Family Status  Relation Status Death Age  . Mother Deceased 41    CHF, renal failure  . Sister Alive   . Father Deceased 82    unknown cause of death  . Daughter Alive     healthy  . Son Alive     healthy  . Maternal Grandmother Deceased   . Maternal Grandfather Deceased   . Paternal Grandmother Deceased   . Paternal Grandfather Deceased   . Sister Alive   . Sister Alive     ROS:  A complete 10 system review of systems was obtained and was unremarkable apart from what is mentioned above.  PHYSICAL EXAMINATION:    VITALS:   Filed Vitals:   02/04/14 1057  BP: 102/54    Pulse: 84  Resp: 20  Height: 5\' 4"  (1.626 m)  Weight: 199 lb (90.266 kg)    GEN:  The patient appears stated age and is in NAD. HEENT:  Normocephalic, atraumatic.  The mucous membranes are moist. The superficial temporal arteries are without ropiness or tenderness.  Neurological examination:  Orientation: The patient is alert and oriented x3. Fund of knowledge is appropriate.  Recent and remote memory are intact.  Attention and concentration are normal.    Able to name objects and repeat phrases. Cranial nerves: There is good facial symmetry.  The speech is fluent and clear. Soft palate rises symmetrically and there is no tongue deviation. Hearing is intact to conversational tone. Sensation: Sensation is intact to light and pinprick throughout (facial, trunk, extremities). Vibration is intact at the bilateral big toe. There is no extinction with double simultaneous stimulation. There is no sensory dermatomal level identified. Motor: Strength is 5/5 in the bilateral upper and lower extremities.   Shoulder shrug is equal and symmetric.  There is no pronator drift. Deep tendon reflexes: Deep tendon reflexes are 3/4 at the bilateral biceps, triceps, brachioradialis, patella and achilles. No ankle clonus.  Is a Hoffmans response on the R.  Plantar responses are downgoing bilaterally.  Movement examination: Tone: There is normal tone in the bilateral upper extremities.  The tone in the lower extremities is normal.  Abnormal movements: There is a chin tremor.  There is a RUE resting tremor.  There is a postural tremor bilaterally, R>L Coordination:  There is no decremation with RAM's with any form of RAM's including alternating supination and pronation of the forearm, hand opening and closing, finger taps, heel taps and toe taps. Gait and Station: The patient has mild difficulty arising out of a deep-seated chair without the use of the hands. It takes 2 attempts.  The patient's stride length is  slightly decreased.      ASSESSMENT/PLAN:  1.  Essential tremor.  -The pt has longstanding essential tremor that is somewhat asymmetric and is now manifesting as resting tremor.  There is no evidence of a neurodegenerative condition such as Parkinson's disease.  She and I discussed this today.  We discussed various treatments, ranging from doing nothing to medications to surgical options.  She opted to hold on treatment.  We did discuss that she could not have beta blockers because of her COPD and that we would have to cautiously use primidone because of her coumadin.  She was just relieved to hear that she did not have PD. 2.  Hyperreflexia  -She has no evidence of myelopathy but the hyperreflexia certainly could be coming from degenerative changes in the neck.  She does have some neck pain. In the end, she decided that she would not have any surgery right now no matter what the findings so I decided not to image.  IF neck pain becomes a big issue, if she begins to fall or has loss of bladder or bowel control, I would suggest that this is re-visited. 3.  Followup in one year, sooner should new neurologic issues arise.

## 2014-02-09 NOTE — Addendum Note (Signed)
Addended by: Debbrah Alar on: 02/09/2014 02:27 PM   Modules accepted: Orders

## 2014-02-09 NOTE — Telephone Encounter (Signed)
Pt presented to lab for lab work.  Printed order for pt/inr.

## 2014-02-10 LAB — PROTIME-INR
INR: 1.98 — AB (ref <1.50)
Prothrombin Time: 22.5 seconds — ABNORMAL HIGH (ref 11.6–15.2)

## 2014-02-11 ENCOUNTER — Telehealth: Payer: Self-pay | Admitting: Family

## 2014-02-11 DIAGNOSIS — I2699 Other pulmonary embolism without acute cor pulmonale: Secondary | ICD-10-CM

## 2014-02-11 NOTE — Telephone Encounter (Signed)
Reviewed PT/INR, slightly subtherapeutic at 1.  2.5 Monday and Friday and whole tablet 5mg  all other days. Advised pt to take 1/2 mondays only. Full tab all other days. Repeat PT/INR on 7/22. Please leave lab order.

## 2014-02-11 NOTE — Telephone Encounter (Signed)
Lab order entered.

## 2014-02-18 LAB — PROTIME-INR
INR: 2.33 — ABNORMAL HIGH (ref <1.50)
Prothrombin Time: 25.6 seconds — ABNORMAL HIGH (ref 11.6–15.2)

## 2014-03-14 ENCOUNTER — Emergency Department (HOSPITAL_BASED_OUTPATIENT_CLINIC_OR_DEPARTMENT_OTHER)
Admission: EM | Admit: 2014-03-14 | Discharge: 2014-03-14 | Disposition: A | Discharge status: Home / Self Care | Payer: Medicare Other | Attending: Emergency Medicine | Admitting: Emergency Medicine

## 2014-03-14 ENCOUNTER — Encounter (HOSPITAL_BASED_OUTPATIENT_CLINIC_OR_DEPARTMENT_OTHER): Payer: Self-pay | Admitting: Emergency Medicine

## 2014-03-14 DIAGNOSIS — K219 Gastro-esophageal reflux disease without esophagitis: Secondary | ICD-10-CM | POA: Insufficient documentation

## 2014-03-14 DIAGNOSIS — J449 Chronic obstructive pulmonary disease, unspecified: Secondary | ICD-10-CM | POA: Insufficient documentation

## 2014-03-14 DIAGNOSIS — J4489 Other specified chronic obstructive pulmonary disease: Secondary | ICD-10-CM | POA: Insufficient documentation

## 2014-03-14 DIAGNOSIS — Z8739 Personal history of other diseases of the musculoskeletal system and connective tissue: Secondary | ICD-10-CM | POA: Diagnosis not present

## 2014-03-14 DIAGNOSIS — N390 Urinary tract infection, site not specified: Secondary | ICD-10-CM | POA: Insufficient documentation

## 2014-03-14 DIAGNOSIS — F411 Generalized anxiety disorder: Secondary | ICD-10-CM | POA: Insufficient documentation

## 2014-03-14 DIAGNOSIS — Z8619 Personal history of other infectious and parasitic diseases: Secondary | ICD-10-CM | POA: Diagnosis not present

## 2014-03-14 DIAGNOSIS — Z7901 Long term (current) use of anticoagulants: Secondary | ICD-10-CM | POA: Insufficient documentation

## 2014-03-14 DIAGNOSIS — F3289 Other specified depressive episodes: Secondary | ICD-10-CM | POA: Diagnosis not present

## 2014-03-14 DIAGNOSIS — Z880 Allergy status to penicillin: Secondary | ICD-10-CM | POA: Insufficient documentation

## 2014-03-14 DIAGNOSIS — M549 Dorsalgia, unspecified: Secondary | ICD-10-CM | POA: Diagnosis not present

## 2014-03-14 DIAGNOSIS — Z79899 Other long term (current) drug therapy: Secondary | ICD-10-CM | POA: Diagnosis not present

## 2014-03-14 DIAGNOSIS — Z87891 Personal history of nicotine dependence: Secondary | ICD-10-CM | POA: Insufficient documentation

## 2014-03-14 DIAGNOSIS — F329 Major depressive disorder, single episode, unspecified: Secondary | ICD-10-CM | POA: Insufficient documentation

## 2014-03-14 DIAGNOSIS — R319 Hematuria, unspecified: Secondary | ICD-10-CM | POA: Diagnosis present

## 2014-03-14 DIAGNOSIS — I509 Heart failure, unspecified: Secondary | ICD-10-CM | POA: Diagnosis not present

## 2014-03-14 DIAGNOSIS — Z86711 Personal history of pulmonary embolism: Secondary | ICD-10-CM | POA: Insufficient documentation

## 2014-03-14 DIAGNOSIS — I129 Hypertensive chronic kidney disease with stage 1 through stage 4 chronic kidney disease, or unspecified chronic kidney disease: Secondary | ICD-10-CM | POA: Insufficient documentation

## 2014-03-14 DIAGNOSIS — N189 Chronic kidney disease, unspecified: Secondary | ICD-10-CM | POA: Diagnosis not present

## 2014-03-14 DIAGNOSIS — IMO0002 Reserved for concepts with insufficient information to code with codable children: Secondary | ICD-10-CM | POA: Diagnosis not present

## 2014-03-14 DIAGNOSIS — E039 Hypothyroidism, unspecified: Secondary | ICD-10-CM | POA: Insufficient documentation

## 2014-03-14 DIAGNOSIS — N289 Disorder of kidney and ureter, unspecified: Secondary | ICD-10-CM

## 2014-03-14 LAB — CBC WITH DIFFERENTIAL/PLATELET
BASOS ABS: 0.1 10*3/uL (ref 0.0–0.1)
Basophils Relative: 1 % (ref 0–1)
Eosinophils Absolute: 0.2 10*3/uL (ref 0.0–0.7)
Eosinophils Relative: 3 % (ref 0–5)
HEMATOCRIT: 36.9 % (ref 36.0–46.0)
Hemoglobin: 12.5 g/dL (ref 12.0–15.0)
LYMPHS PCT: 28 % (ref 12–46)
Lymphs Abs: 2.2 10*3/uL (ref 0.7–4.0)
MCH: 30.6 pg (ref 26.0–34.0)
MCHC: 33.9 g/dL (ref 30.0–36.0)
MCV: 90.4 fL (ref 78.0–100.0)
Monocytes Absolute: 0.6 10*3/uL (ref 0.1–1.0)
Monocytes Relative: 8 % (ref 3–12)
NEUTROS ABS: 4.7 10*3/uL (ref 1.7–7.7)
Neutrophils Relative %: 60 % (ref 43–77)
PLATELETS: 247 10*3/uL (ref 150–400)
RBC: 4.08 MIL/uL (ref 3.87–5.11)
RDW: 12.8 % (ref 11.5–15.5)
WBC: 7.9 10*3/uL (ref 4.0–10.5)

## 2014-03-14 LAB — URINALYSIS, ROUTINE W REFLEX MICROSCOPIC
Bilirubin Urine: NEGATIVE
GLUCOSE, UA: NEGATIVE mg/dL
Ketones, ur: NEGATIVE mg/dL
Nitrite: NEGATIVE
PROTEIN: NEGATIVE mg/dL
Specific Gravity, Urine: 1.014 (ref 1.005–1.030)
UROBILINOGEN UA: 1 mg/dL (ref 0.0–1.0)
pH: 7 (ref 5.0–8.0)

## 2014-03-14 LAB — BASIC METABOLIC PANEL
ANION GAP: 13 (ref 5–15)
BUN: 30 mg/dL — ABNORMAL HIGH (ref 6–23)
CHLORIDE: 103 meq/L (ref 96–112)
CO2: 25 meq/L (ref 19–32)
Calcium: 10.4 mg/dL (ref 8.4–10.5)
Creatinine, Ser: 2.4 mg/dL — ABNORMAL HIGH (ref 0.50–1.10)
GFR calc Af Amer: 23 mL/min — ABNORMAL LOW (ref >90)
GFR calc non Af Amer: 20 mL/min — ABNORMAL LOW (ref >90)
Glucose, Bld: 93 mg/dL (ref 70–99)
Potassium: 4.5 mEq/L (ref 3.7–5.3)
SODIUM: 141 meq/L (ref 137–147)

## 2014-03-14 LAB — URINE MICROSCOPIC-ADD ON

## 2014-03-14 LAB — PROTIME-INR
INR: 3 — ABNORMAL HIGH (ref 0.00–1.49)
PROTHROMBIN TIME: 31.1 s — AB (ref 11.6–15.2)

## 2014-03-14 MED ORDER — CEFTRIAXONE SODIUM 1 G IJ SOLR
INTRAMUSCULAR | Status: AC
  Filled 2014-03-14: qty 10

## 2014-03-14 MED ORDER — DEXTROSE 5 % IV SOLN
1.0000 g | Freq: Once | INTRAVENOUS | Status: AC
  Administered 2014-03-14: 1 g via INTRAVENOUS

## 2014-03-14 MED ORDER — CEPHALEXIN 500 MG PO CAPS: 500.0000 mg | ORAL_CAPSULE | Freq: Two times a day (BID) | ORAL | Status: DC

## 2014-03-14 NOTE — ED Notes (Signed)
Pt sts she was recently told by her PCP that her creatinine was elevated and today she noticed blood in her urine. Pt also c/o lower abd and lower back pain that also began today.

## 2014-03-14 NOTE — ED Notes (Signed)
MD at bedside. 

## 2014-03-14 NOTE — Discharge Instructions (Signed)
You have a urinary tract infection with blood in your urine.  Your inr is 3.0.  Please take all antibiotics as prescribed.  Return if fever, worsened pain, or unable to keep medications down.  Recheck with your doctor next week to make sure your inr continues at appropriate treatment levels.   Urinary Tract Infection Urinary tract infections (UTIs) can develop anywhere along your urinary tract. Your urinary tract is your body's drainage system for removing wastes and extra water. Your urinary tract includes two kidneys, two ureters, a bladder, and a urethra. Your kidneys are a pair of bean-shaped organs. Each ki   dney is about the size of your fist. They are located below your ribs, one on each side of your spine. CAUSES Infections are caused by microbes, which are microscopic organisms, including fungi, viruses, and bacteria. These organisms are so small that they can only be seen through a microscope. Bacteria are the microbes that most commonly cause UTIs. SYMPTOMS  Symptoms of UTIs may vary by age and gender of the patient and by the location of the infection. Symptoms in young women typically include a frequent and intense urge to urinate and a painful, burning feeling in the bladder or urethra during urination. Older women and men are more likely to be tired, shaky, and weak and have muscle aches and abdominal pain. A fever may mean the infection is in your kidneys. Other symptoms of a kidney infection include pain in your back or sides below the ribs, nausea, and vomiting. DIAGNOSIS To diagnose a UTI, your caregiver will ask you about your symptoms. Your caregiver also will ask to provide a urine sample. The urine sample will be tested for bacteria and white blood cells. White blood cells are made by your body to help fight infection. TREATMENT  Typically, UTIs can be treated with medication. Because most UTIs are caused by a bacterial infection, they usually can be treated with the use of  antibiotics. The choice of antibiotic and length of treatment depend on your symptoms and the type of bacteria causing your infection. HOME CARE INSTRUCTIONS  If you were prescribed antibiotics, take them exactly as your caregiver instructs you. Finish the medication even if you feel better after you have only taken some of the medication.  Drink enough water and fluids to keep your urine clear or pale yellow.  Avoid caffeine, tea, and carbonated beverages. They tend to irritate your bladder.  Empty your bladder often. Avoid holding urine for long periods of time.  Empty your bladder before and after sexual intercourse.  After a bowel movement, women should cleanse from front to back. Use each tissue only once. SEEK MEDICAL CARE IF:   You have back pain.  You develop a fever.  Your symptoms do not begin to resolve within 3 days. SEEK IMMEDIATE MEDICAL CARE IF:   You have severe back pain or lower abdominal pain.  You develop chills.  You have nausea or vomiting.  You have continued burning or discomfort with urination. MAKE SURE YOU:   Understand these instructions.  Will watch your condition.  Will get help right away if you are not doing well or get worse. Document Released: 04/26/2005 Document Revised: 01/16/2012 Document Reviewed: 08/25/2011 [facility] Patient Information 2015 [place], [place]. This information is not intended to replace advice given to you by your health care provider. Make sure you discuss any questions you have with your health care provider.

## 2014-03-14 NOTE — ED Provider Notes (Signed)
CSN: [id number]     Arrival date & time 03/14/14  1623 History  This chart was scribed for Emily Pollack, MD by Randa Evens, ED Scribe. This patient was seen in room MH10/MH10 and the patient's care was started at 4:29 PM.      Chief Complaint  Patient presents with  . Hematuria   Patient is a 68 y.o. female presenting with hematuria. The history is provided by the patient. No language interpreter was used.  Hematuria This is a new problem. The current episode started 6 to 12 hours ago. The problem has not changed since onset.Associated symptoms include abdominal pain. Nothing aggravates the symptoms. Nothing relieves the symptoms. She has tried nothing for the symptoms.   HPI Comments: Emily Mitchell is a 67 y.o. female who presents to the Emergency Department complaining of Hematuria onset today. She state she has associated abdominal pain and lower back pain. She states she has urinated 4x today and blood has been present in her urine every time. She states she is currently taking Coumadin. She states she has a Hx of bad kidneys, CHF and COPD. She denies dysuria, urinary frequency, fever,chills, or blood in stool. She states her PCP recently told her that her creatinine levels were elvevated.     Past Medical History  Diagnosis Date  . Carotid artery occlusion   . Hyperlipidemia   . Hypertension   . COPD (chronic obstructive pulmonary disease)   . Chicken pox as a child  . Mumps 26 yrs old  . GERD (gastroesophageal reflux disease)   . Asthma   . Arthritis   . Hypothyroidism   . 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 Tremor   . CHF (congestive heart failure)   . Anxiety and depression 12/14/2008    Qualifier: Diagnosis of  By: [name] LPN, Almyra Free    . Pulmonary emboli 05/26/2013  . H. pylori infection 10/19/2013  . UTI (lower urinary tract infection) 10/19/2013   Past Surgical History  Procedure Laterality Date  . Carotid endarterectomy Right 05/10/07     cea  . Knees replaced  2005 and 2011    both knees  . Wisdom tooth extraction    . Abdominal hysterectomy  1984  . Cataract extraction Left    Family History  Problem Relation Age of Onset  . Stroke Mother     mini stroke  . Kidney disease Mother   . Heart failure Mother   . Hypertension Mother   . Diabetes Sister     type 2  . Hyperlipidemia Sister    History  Substance Use Topics  . Smoking status: Former Smoker -- 2.00 packs/day for 30 years    Types: Cigarettes    Quit date: 12/03/1994  . Smokeless tobacco: Never Used  . Alcohol Use: No   OB History   Grav Para Term Preterm Abortions TAB SAB Ect Mult Living                 Review of Systems  Constitutional: Negative for fever and chills.  Gastrointestinal: Positive for abdominal pain. Negative for blood in stool.  Genitourinary: Positive for hematuria. Negative for dysuria and frequency.  Musculoskeletal: Positive for back pain.  All other systems reviewed and are negative.   Allergies  Niaspan; Pantoprazole; Bupropion; Penicillins; Statins; and Sulfonamide derivatives  Home Medications   Prior to Admission medications   Medication Sig Start Date End Date Taking? Authorizing Provider  acetaminophen (TYLENOL) 500 MG tablet Take 500 mg by mouth every 6 (six) hours as needed.    Historical Provider, MD  albuterol (PROVENTIL HFA;VENTOLIN HFA) 108 (90 BASE) MCG/ACT inhaler Inhale 2 puffs into the lungs every 4 (four) hours as needed for wheezing.    Historical Provider, MD  calcium citrate-vitamin D (CITRACAL+D) 315-200 MG-UNIT per tablet Take 1 tablet by mouth daily.     Historical Provider, MD  Cholecalciferol (D3 MAXIMUM STRENGTH) 5000 UNITS capsule Take 5,000 Units by mouth daily.    Historical Provider, MD  Choline Fenofibrate (TRILIPIX) 135 MG capsule Take 135 mg by mouth daily.    Historical Provider, MD  citalopram (CELEXA) 40 MG tablet TAKE ONE TABLET BY MOUTH ONCE DAILY 11/10/13   Mosie Lukes, MD   clonazePAM (KLONOPIN) 1 MG tablet Take 1 tablet (1 mg total) by mouth 3 (three) times daily as needed for anxiety. 12/12/13   Mosie Lukes, MD  Cyanocobalamin (VITAMIN B-12 PO) Take by mouth.    Historical Provider, MD  esomeprazole (NEXIUM) 40 MG capsule Take 1 capsule (40 mg total) by mouth daily at 12 noon. 12/12/13   Mosie Lukes, MD  ezetimibe (ZETIA) 10 MG tablet Take 1 tablet (10 mg total) by mouth daily. 12/12/13   Mosie Lukes, MD  Fluticasone-Salmeterol (ADVAIR) 250-50 MCG/DOSE AEPB Inhale 1 puff into the lungs every 12 (twelve) hours. 12/12/13   Mosie Lukes, MD  furosemide (LASIX) 20 MG tablet Take 1 tablet (20 mg total) by mouth daily as needed for fluid or edema (weight gain >3# in 24, sob). 12/12/13   Mosie Lukes, MD  levalbuterol (XOPENEX) 1.25 MG/0.5ML nebulizer solution Take 1.25 mg by nebulization every 4 (four) hours as needed for wheezing. 12/12/13   Mosie Lukes, MD  levothyroxine (SYNTHROID, LEVOTHROID) 137 MCG tablet Take 1 tablet (137 mcg total) by mouth daily before breakfast. 12/16/13   Mosie Lukes, MD  montelukast (SINGULAIR) 10 MG tablet Take 1 tablet (10 mg total) by mouth at bedtime. 12/12/13   Mosie Lukes, MD  Multiple Vitamins-Minerals (PRESERVISION AREDS PO) Take 10 g by mouth every morning.    Historical Provider, MD  nitroGLYCERIN (NITROSTAT) 0.4 MG SL tablet Place 1 tablet (0.4 mg total) under the tongue every 5 (five) minutes as needed for chest pain. 06/23/13   Mosie Lukes, MD  telmisartan-hydrochlorothiazide (MICARDIS HCT) 80-12.5 MG per tablet Take 0.5 tablets by mouth daily. Marland Kitchen5 tablet daily 12/16/13   Mosie Lukes, MD  traMADol (ULTRAM) 50 MG tablet Take 1 tablet (50 mg total) by mouth every 8 (eight) hours as needed for moderate pain or severe pain. 12/12/13   Mosie Lukes, MD  warfarin (COUMADIN) 5 MG tablet 5 mg tab daily or alter as directed 12/12/13   Mosie Lukes, MD   Triage Vitals: BP 111/57  Pulse 84  Temp(Src) 98.2 F (36.8 C)  (Oral)  Resp 20  Ht 5\' 4"  (1.626 m)  Wt 200 lb (90.719 kg)  BMI 34.31 kg/m2  SpO2 99%  Physical Exam  Nursing note and vitals reviewed. Constitutional: She is oriented to person, place, and time. She appears well-developed and well-nourished.  HENT:  Head: Normocephalic and atraumatic.  Right Ear: External ear normal.  Left Ear: External ear normal.  Nose: Nose normal.  Mouth/Throat: Oropharynx is clear and moist.  Eyes: Conjunctivae and EOM are normal. Pupils are equal, round, and reactive to light.  Neck: Normal range of motion. Neck supple.  Cardiovascular: Normal rate, regular rhythm, normal heart sounds and intact distal pulses.   Pulmonary/Chest: Effort normal and breath sounds normal.  Abdominal: Soft. Bowel sounds are normal.  Musculoskeletal: Normal range of motion.  Neurological: She is alert and oriented to person, place, and time. She has normal reflexes.  Skin: Skin is warm and dry.  Psychiatric: She has a normal mood and affect. Her behavior is normal. Judgment and thought content normal.    ED Course  Procedures (including critical care time) DIAGNOSTIC STUDIES: Oxygen Saturation is 99% on RA, normal by my interpretation.    COORDINATION OF CARE: 4:45 PM-Discussed treatment plan which includes CBC panel, BMP, UA with pt at bedside and pt agreed to plan.     Labs Review Labs Reviewed  URINALYSIS, ROUTINE W REFLEX MICROSCOPIC - Abnormal; Notable for the following:    APPearance CLOUDY (*)    Hgb urine dipstick LARGE (*)    Leukocytes, UA LARGE (*)    All other components within normal limits  BASIC METABOLIC PANEL - Abnormal; Notable for the following:    BUN 30 (*)    Creatinine, Ser 2.40 (*)    GFR calc non Af Amer 20 (*)    GFR calc Af Amer 23 (*)    All other components within normal limits  PROTIME-INR - Abnormal; Notable for the following:    Prothrombin Time 31.1 (*)    INR 3.00 (*)    All other components within normal limits  URINE  MICROSCOPIC-ADD ON - Abnormal; Notable for the following:    Squamous Epithelial / LPF MANY (*)    Bacteria, UA MANY (*)    All other components within normal limits  CBC WITH DIFFERENTIAL    Imaging Review No results found.   EKG Interpretation None      MDM   Final diagnoses:  UTI (lower urinary tract infection)  Hematuria  Chronic anticoagulation  Renal insufficiency     68 year-old female with renal insufficiency presents today with hematuria and workup consistent with urinary tract infection. She does have a elevated INR secondary to Coumadin and today it is at 3.0. Patient is on this for previous pulmonary embolism. Patient's urine is being cultured. She's not appear to be bleeding anywhere else and her hemoglobin is normal. She is given Rocephin here in the emergency department and will be treated with Keflex 500 mg every 12 hours with creatinine clearance of 32 mL's per minute calculated. Patient is advised of need for close followup with need to have blood cleared and return precautions especially for worsening or additional bleeding and voices understanding. I personally performed the services described in this documentation, which was scribed in my presence. The recorded information has been reviewed and considered.     Emily Pollack, MD 03/16/14 1140

## 2014-03-17 LAB — URINE CULTURE: Colony Count: >=100000

## 2014-03-18 ENCOUNTER — Other Ambulatory Visit: Payer: Self-pay | Admitting: Family Medicine

## 2014-03-18 ENCOUNTER — Telehealth: Payer: Self-pay | Admitting: *Deleted

## 2014-03-18 DIAGNOSIS — Z79899 Other long term (current) drug therapy: Secondary | ICD-10-CM

## 2014-03-18 NOTE — Telephone Encounter (Signed)
Per [facility] lab request; pt in lab w/o orders/SLS

## 2014-03-18 NOTE — ED Notes (Signed)
Reviewed by Milus Glazier, RPH.  (+) urine culture, currently on Cephalexin

## 2014-03-19 ENCOUNTER — Telehealth: Payer: Self-pay | Admitting: Physician Assistant

## 2014-03-19 DIAGNOSIS — IMO0002 Reserved for concepts with insufficient information to code with codable children: Secondary | ICD-10-CM

## 2014-03-19 LAB — PROTIME-INR
INR: 3.61 — AB (ref <1.50)
PROTHROMBIN TIME: 36 s — AB (ref 11.6–15.2)

## 2014-03-19 NOTE — Telephone Encounter (Signed)
Placed order for repeat PT/INR to be obtained as no order had been placed other than standing monthly PT/INR.

## 2014-03-20 ENCOUNTER — Telehealth: Payer: Self-pay | Admitting: Physician Assistant

## 2014-03-20 ENCOUNTER — Ambulatory Visit: Payer: Medicare Other | Admitting: Family Medicine

## 2014-03-20 LAB — PROTIME-INR
INR: 2.65 — ABNORMAL HIGH (ref <1.50)
Prothrombin Time: 28.3 seconds — ABNORMAL HIGH (ref 11.6–15.2)

## 2014-03-20 NOTE — Telephone Encounter (Signed)
INR down to 2.6 from 3.61 yesterday just holding one day's dosing.  Instructed patient to take 2.5 mg today, then resume normal dosing which is 5 mg daily except on M when she takes 2.5 mg. Follow-up with Dr. Charlett Blake as scheduled on 03/26/14

## 2014-03-26 ENCOUNTER — Encounter: Payer: Self-pay | Admitting: Family Medicine

## 2014-03-26 ENCOUNTER — Ambulatory Visit (INDEPENDENT_AMBULATORY_CARE_PROVIDER_SITE_OTHER): Payer: Medicare Other | Admitting: Family Medicine

## 2014-03-26 VITALS — BP 112/72 | HR 65 | Temp 98.1°F | Ht 64.0 in | Wt 197.0 lb

## 2014-03-26 DIAGNOSIS — F341 Dysthymic disorder: Secondary | ICD-10-CM

## 2014-03-26 DIAGNOSIS — G250 Essential tremor: Secondary | ICD-10-CM

## 2014-03-26 DIAGNOSIS — G252 Other specified forms of tremor: Secondary | ICD-10-CM

## 2014-03-26 DIAGNOSIS — J449 Chronic obstructive pulmonary disease, unspecified: Secondary | ICD-10-CM

## 2014-03-26 DIAGNOSIS — N189 Chronic kidney disease, unspecified: Secondary | ICD-10-CM

## 2014-03-26 DIAGNOSIS — I2699 Other pulmonary embolism without acute cor pulmonale: Secondary | ICD-10-CM

## 2014-03-26 DIAGNOSIS — Z23 Encounter for immunization: Secondary | ICD-10-CM

## 2014-03-26 DIAGNOSIS — E038 Other specified hypothyroidism: Secondary | ICD-10-CM

## 2014-03-26 DIAGNOSIS — N390 Urinary tract infection, site not specified: Secondary | ICD-10-CM

## 2014-03-26 DIAGNOSIS — K219 Gastro-esophageal reflux disease without esophagitis: Secondary | ICD-10-CM

## 2014-03-26 DIAGNOSIS — F329 Major depressive disorder, single episode, unspecified: Secondary | ICD-10-CM

## 2014-03-26 DIAGNOSIS — F419 Anxiety disorder, unspecified: Secondary | ICD-10-CM

## 2014-03-26 DIAGNOSIS — F32A Depression, unspecified: Secondary | ICD-10-CM

## 2014-03-26 MED ORDER — WARFARIN SODIUM 5 MG PO TABS: ORAL_TABLET | ORAL | Status: DC

## 2014-03-26 MED ORDER — CLONAZEPAM 1 MG PO TABS
1.0000 mg | ORAL_TABLET | Freq: Three times a day (TID) | ORAL | Status: DC | PRN
Start: 2014-03-26 — End: 2014-06-30

## 2014-03-26 NOTE — Progress Notes (Addendum)
Patient ID: Emily Mitchell, female   DOB: 03/13/1946, 68 y.o.   MRN: [id number] Emily Mitchell [id number] 1946-02-03 03/26/2014      Progress Note-Follow Up  Subjective  Chief Complaint  Chief Complaint  Patient presents with  . Follow-up    3 month  . Injections    flu    HPI  Patient is a 68 year old female in today for routine medical care. Doing well, here today with her son, they are questioning is she can stop her coumadin. They note she has stopped her O2 at night and she is sleeping better. She reports less fatigue and less HA. Has seen neuro and is pleased with current state. No recent illness or acute concern. Denies CP/palp/SOB/HA/congestion/fevers/GI or GU c/o. Taking meds as prescribed  Past Medical History  Diagnosis Date  . Carotid artery occlusion   . Hyperlipidemia   . Hypertension   . COPD (chronic obstructive pulmonary disease)   . Chicken pox as a child  . Mumps 15 yrs old  . GERD (gastroesophageal reflux disease)   . Asthma   . Arthritis   . Hypothyroidism   . Chronic kidney disease     Bright's Disease at age 71   . Tremor   . CHF (congestive heart failure)   . Anxiety and depression 12/14/2008    Qualifier: Diagnosis of  By: [name] LPN, Almyra Free    . Pulmonary emboli 05/26/2013  . H. pylori infection 10/19/2013  . UTI (lower urinary tract infection) 10/19/2013    Past Surgical History  Procedure Laterality Date  . Carotid endarterectomy Right 05/10/07    cea  . Knees replaced  2005 and 2011    both knees  . Wisdom tooth extraction    . Abdominal hysterectomy  1984  . Cataract extraction Left     Family History  Problem Relation Age of Onset  . Stroke Mother     mini stroke  . Kidney disease Mother   . Heart failure Mother   . Hypertension Mother   . Diabetes Sister     type 2  . Hyperlipidemia Sister     History   Social History  . Marital Status: Divorced    Spouse Name: N/A    Number of Children: 2  . Years of Education: N/A    Occupational History  . Not on file.   Social History Main Topics  . Smoking status: Former Smoker -- 2.00 packs/day for 30 years    Types: Cigarettes    Quit date: 12/03/1994  . Smokeless tobacco: Never Used  . Alcohol Use: No  . Drug Use: No  . Sexual Activity: No   Other Topics Concern  . Not on file   Social History Narrative  . No narrative on file    Current Outpatient Prescriptions on File Prior to Visit  Medication Sig Dispense Refill  . acetaminophen (TYLENOL) 500 MG tablet Take 500 mg by mouth every 6 (six) hours as needed.      Marland Kitchen albuterol (PROVENTIL HFA;VENTOLIN HFA) 108 (90 BASE) MCG/ACT inhaler Inhale 2 puffs into the lungs every 4 (four) hours as needed for wheezing.      . calcium citrate-vitamin D (CITRACAL+D) 315-200 MG-UNIT per tablet Take 1 tablet by mouth daily.       . Cholecalciferol (D3 MAXIMUM STRENGTH) 5000 UNITS capsule Take 5,000 Units by mouth daily.      . Choline Fenofibrate (TRILIPIX) 135 MG capsule Take 135 mg by mouth daily.      Marland Kitchen  Cyanocobalamin (VITAMIN B-12 PO) Take by mouth.      . esomeprazole (NEXIUM) 40 MG capsule Take 1 capsule (40 mg total) by mouth daily at 12 noon.  90 capsule  3  . ezetimibe (ZETIA) 10 MG tablet Take 1 tablet (10 mg total) by mouth daily.  90 tablet  3  . Fluticasone-Salmeterol (ADVAIR) 250-50 MCG/DOSE AEPB Inhale 1 puff into the lungs every 12 (twelve) hours.  180 each  3  . furosemide (LASIX) 20 MG tablet Take 1 tablet (20 mg total) by mouth daily as needed for fluid or edema (weight gain >3# in 24, sob).  30 tablet  3  . levalbuterol (XOPENEX) 1.25 MG/0.5ML nebulizer solution Take 1.25 mg by nebulization every 4 (four) hours as needed for wheezing.  30 each  1  . levothyroxine (SYNTHROID, LEVOTHROID) 137 MCG tablet Take 1 tablet (137 mcg total) by mouth daily before breakfast.  90 tablet  3  . montelukast (SINGULAIR) 10 MG tablet Take 1 tablet (10 mg total) by mouth at bedtime.  30 tablet  0  . Multiple  Vitamins-Minerals (PRESERVISION AREDS PO) Take 10 g by mouth every morning.      . nitroGLYCERIN (NITROSTAT) 0.4 MG SL tablet Place 1 tablet (0.4 mg total) under the tongue every 5 (five) minutes as needed for chest pain.  25 tablet  3  . telmisartan-hydrochlorothiazide (MICARDIS HCT) 80-12.5 MG per tablet Take 0.5 tablets by mouth daily. Marland Kitchen5 tablet daily  45 tablet  3  . traMADol (ULTRAM) 50 MG tablet Take 1 tablet (50 mg total) by mouth every 8 (eight) hours as needed for moderate pain or severe pain.  90 tablet  0   No current facility-administered medications on file prior to visit.    Allergies  Allergen Reactions  . Niaspan [Niacin Er] Nausea And Vomiting and Swelling    Swelling in mouth  . Pantoprazole     Mouth sores  . Bupropion Other (See Comments)    Uncontrollable shakes  . Penicillins Hives  . Statins   . Sulfonamide Derivatives Hives    Review of Systems  Review of Systems  Constitutional: Negative for fever and malaise/fatigue.  HENT: Negative for congestion.   Eyes: Negative for discharge.  Respiratory: Negative for shortness of breath.   Cardiovascular: Negative for chest pain, palpitations and leg swelling.  Gastrointestinal: Negative for nausea, abdominal pain and diarrhea.  Genitourinary: Negative for dysuria.  Musculoskeletal: Negative for falls.  Skin: Negative for rash.  Neurological: Positive for tremors. Negative for loss of consciousness and headaches.  Endo/Heme/Allergies: Negative for polydipsia.  Psychiatric/Behavioral: Negative for depression and suicidal ideas. The patient is not nervous/anxious and does not have insomnia.     Objective  BP 112/72  Pulse 65  Temp(Src) 98.1 F (36.7 C) (Oral)  Ht 5\' 4"  (1.626 m)  Wt 197 lb (89.359 kg)  BMI 33.80 kg/m2  SpO2 95%  Physical Exam  Physical Exam  Constitutional: She is oriented to person, place, and time and well-developed, well-nourished, and in no distress. No distress.  HENT:  Head:  Normocephalic and atraumatic.  Eyes: Conjunctivae are normal.  Neck: Neck supple. No thyromegaly present.  Cardiovascular: Normal rate, regular rhythm and normal heart sounds.   No murmur heard. Pulmonary/Chest: Effort normal and breath sounds normal. She has no wheezes.  Abdominal: She exhibits no distension and no mass.  Musculoskeletal: She exhibits no edema.  Lymphadenopathy:    She has no cervical adenopathy.  Neurological: She is alert and oriented  to person, place, and time.  Skin: Skin is warm and dry. No rash noted. She is not diaphoretic.  Psychiatric: Memory, affect and judgment normal.    Lab Results  Component Value Date   TSH 0.584 12/12/2013   Lab Results  Component Value Date   WBC 7.9 03/14/2014   HGB 12.5 03/14/2014   HCT 36.9 03/14/2014   MCV 90.4 03/14/2014   PLT 247 03/14/2014   Lab Results  Component Value Date   CREATININE 2.40* 03/14/2014   BUN 30* 03/14/2014   NA 141 03/14/2014   K 4.5 03/14/2014   CL 103 03/14/2014   CO2 25 03/14/2014   Lab Results  Component Value Date   ALT 17 10/16/2013   AST 18 10/16/2013   ALKPHOS 39 10/16/2013   BILITOT 0.4 10/16/2013   Lab Results  Component Value Date   CHOL 171 10/16/2013   Lab Results  Component Value Date   HDL 42 10/16/2013   Lab Results  Component Value Date   LDLCALC 83 10/16/2013   Lab Results  Component Value Date   TRIG 230* 10/16/2013   Lab Results  Component Value Date   CHOLHDL 4.1 10/16/2013     Assessment & Plan  CHRONIC OBSTRUCTIVE PULMONARY DISEASE, OXYGEN DEPENDENT Patient is asking to have her O2 qhs  Discontinued. She is no longer using it and says she feels better. She agrees to let her home health agency[facility] come out and perform an overnight pulse ox to confirm she no longer needs it before we d/c  GERD Avoid offending foods, start probiotics. Do not eat large meals in late evening and consider raising head of bed.   Pulmonary emboli Discussed discontinuing coumadin  today, it has been over a year since her PE, but in light of new evidence of increased TE upon discontinuation after a year will continue for now. Is tolerating Coumadin and has generally been stable except for a small bump in INR with Keflex. Will continue to monitor  UTI (lower urinary tract infection) resolved  Essential and other specified forms of tremor Has been seen by neurology and tremor is tolerable, is comfortable with symptoms as they presently stand  Hypothyroidism On Levothyroxine, continue to monitor  Chronic kidney disease Stable following with nephrology

## 2014-03-26 NOTE — Progress Notes (Signed)
Pre visit review using our clinic review tool, if applicable. No additional management support is needed unless otherwise documented below in the visit note. 

## 2014-03-26 NOTE — Patient Instructions (Signed)
Pulmonary Embolism A pulmonary (lung) embolism (PE) is a blood clot that has traveled to the lung and results in a blockage of blood flow in the affected lung. Most clots come from deep veins in the legs or pelvis. PE is a dangerous and potentially life-threatening condition that can be treated if identified. CAUSES Blood clots form in a vein for different reasons. Usually several things cause blood clots. They include:  The flow of blood slows down.  The inside of the vein is damaged in some way.  The person has a condition that makes the blood clot more easily. RISK FACTORS Some people are more likely than others to develop PE. Risk factors include:   Smoking.  Being overweight (obese).  Sitting or lying still for a long time. This includes long-distance travel, paralysis, or recovery from an illness or surgery. Other factors that increase risk are:   Older age, especially over 75 years of age.  Having a family history of blood clots or if you have already had a blood clot.  Having major or lengthy surgery. This is especially true for surgery on the hip, knee, or belly (abdomen). Hip surgery is particularly high risk.  Having a long, thin tube (catheter) placed inside a vein during a medical procedure.  Breaking a hip or leg.  Having cancer or cancer treatment.  Medicines containing the female hormone estrogen. This includes birth control pills and hormone replacement therapy.  Other circulation or heart problems.  Pregnancy and childbirth.  Hormone changes make the blood clot more easily during pregnancy.  The fetus puts pressure on the veins of the pelvis.  There is a risk of injury to veins during delivery or a caesarean delivery. The risk is highest just after childbirth.  PREVENTION   Exercise the legs regularly. Take a brisk 30 minute walk every day.  Maintain a weight that is appropriate for your height.  Avoid sitting or lying in bed for long periods of  time without moving your legs.  Women, particularly those over the age of 35 years, should consider the risks and benefits of taking estrogen medicines, including birth control pills.  Do not smoke, especially if you take estrogen medicines.  Long-distance travel can increase your risk. You should exercise your legs by walking or pumping the muscles every hour.  Many of the risk factors above relate to situations that exist with hospitalization, either for illness, injury, or elective surgery. Prevention may include medical and nonmedical measures.   Your health care provider will assess you for the need for venous thromboembolism prevention when you are admitted to the hospital. If you are having surgery, your surgeon will assess you the day of or day after surgery.  SYMPTOMS  The symptoms of a PE usually start suddenly and include:  Shortness of breath.  Coughing.  Coughing up blood or blood-tinged mucus.  Chest pain. Pain is often worse with deep breaths.  Rapid heartbeat. DIAGNOSIS  If a PE is suspected, your health care provider will take a medical history and perform a physical exam. Other tests that may be required include:  Blood tests, such as studies of the clotting properties of your blood.  Imaging tests, such as ultrasound, CT, MRI, and other tests to see if you have clots in your legs or lungs.  An electrocardiogram. This can look for heart strain from blood clots in the lungs. TREATMENT   The most common treatment for a PE is blood thinning (anticoagulant) medicine, which reduces   the blood's tendency to clot. Anticoagulants can stop new blood clots from forming and old clots from growing. They cannot dissolve existing clots. Your body does this by itself over time. Anticoagulants can be given by mouth, through an intravenous (IV) tube, or by injection. Your health care provider will determine the best program for you.  Less commonly, clot-dissolving medicines  (thrombolytics) are used to dissolve a PE. They carry a high risk of bleeding, so they are used mainly in severe cases.  Very rarely, a blood clot in the leg needs to be removed surgically.  If you are unable to take anticoagulants, your health care provider may arrange for you to have a filter placed in a main vein in your abdomen. This filter prevents clots from traveling to your lungs. HOME CARE INSTRUCTIONS   Take all medicines as directed by your health care provider.  Learn as much as you can about DVT.  Wear a medical alert bracelet or carry a medical alert card.  Ask your health care provider how soon you can go back to normal activities. It is important to stay active to prevent blood clots. If you are on anticoagulant medicine, avoid contact sports.  It is very important to exercise. This is especially important while traveling, sitting, or standing for long periods of time. Exercise your legs by walking or by tightening and relaxing your leg muscles regularly. Take frequent walks.  You may need to wear compression stockings. These are tight elastic stockings that apply pressure to the lower legs. This pressure can help keep the blood in the legs from clotting. Taking Warfarin Warfarin is a daily medicine that is taken by mouth. Your health care provider will advise you on the length of treatment (usually 3-6 months, sometimes lifelong). If you take warfarin:  Understand how to take warfarin and foods that can affect how warfarin works in your body.  Too much and too little warfarin are both dangerous. Too much warfarin increases the risk of bleeding. Too little warfarin continues to allow the risk for blood clots. Warfarin and Regular Blood Testing While taking warfarin, you will need to have regular blood tests to measure your blood clotting time. These blood tests usually include both the prothrombin time (PT) and international normalized ratio (INR) tests. The PT and INR  results allow your health care provider to adjust your dose of warfarin. It is very important that you have your PT and INR tested as often as directed by your health care provider.  Warfarin and Your Diet Avoid major changes in your diet, or notify your health care provider before changing your diet. Arrange a visit with a registered dietitian to answer your questions. Many foods, especially foods high in vitamin K, can interfere with warfarin and affect the PT and INR results. You should eat a consistent amount of foods high in vitamin K. Foods high in vitamin K include:   Spinach, kale, broccoli, cabbage, collard and turnip greens, Brussels sprouts, peas, cauliflower, seaweed, and parsley.  Beef and pork liver.  Green tea.  Soybean oil. Warfarin with Other Medicines Many medicines can interfere with warfarin and affect the PT and INR results. You must:  Tell your health care provider about any and all medicines, vitamins, and supplements you take, including aspirin and other over-the-counter anti-inflammatory medicines. Be especially cautious with aspirin and anti-inflammatory medicines. Ask your health care provider before taking these.  Do not take or discontinue any prescribed or over-the-counter medicine except on the advice   of your health care provider or pharmacist. Warfarin Side Effects Warfarin can have side effects, such as easy bruising and difficulty stopping bleeding. Ask your health care provider or pharmacist about other side effects of warfarin. You will need to:  Hold pressure over cuts for longer than usual.  Notify your dentist and other health care providers that you are taking warfarin before you undergo any procedures where bleeding may occur. Warfarin with Alcohol and Tobacco   Drinking alcohol frequently can increase the effect of warfarin, leading to excess bleeding. It is best to avoid alcoholic drinks or consume only very small amounts while taking warfarin.  Notify your health care provider if you change your alcohol intake.  Do not use any tobacco products including cigarettes, chewing tobacco, or electronic cigarettes. If you smoke, quit. Ask your health care provider for help with quitting smoking. Alternative Medicines to Warfarin: Factor Xa Inhibitor Medicines  These blood thinning medicines are taken by mouth, usually for several weeks or longer. It is important to take the medicine every single day, at the same time each day.  There are no regular blood tests required when using these medicines.  There are fewer food and drug interactions than with warfarin.  The side effects of this class of medicine is similar to that of warfarin, including excessive bruising or bleeding. Ask your health care provider or pharmacist about other potential side effects. SEEK MEDICAL CARE IF:   You notice a rapid heartbeat.  You feel weaker or more tired than usual.  You feel faint.  You notice increased bruising.  Your symptoms are not getting better in the time expected.  You are having side effects of medicine. SEEK IMMEDIATE MEDICAL CARE IF:   You have chest pain.  You have trouble breathing.  You have new or increased swelling or pain in one leg.  You cough up blood.  You notice blood in vomit, in a bowel movement, or in urine.  You have a fever. Symptoms of PE may represent a serious problem that is an emergency. Do not wait to see if the symptoms will go away. Get medical help right away. Call your local emergency services (911 in the [place]). Do not drive yourself to the hospital. Document Released: 07/14/2000 Document Revised: 12/01/2013 Document Reviewed: 07/28/2013 Exit[employer] Patient Information 2015 [employer]. This information is not intended to replace advice given to you by your health care provider. Make sure you discuss any questions you have with your health care provider.  

## 2014-03-27 LAB — URINALYSIS
BILIRUBIN URINE: NEGATIVE
Glucose, UA: NEGATIVE mg/dL
Hgb urine dipstick: NEGATIVE
Ketones, ur: NEGATIVE mg/dL
Leukocytes, UA: NEGATIVE
Nitrite: NEGATIVE
PH: 6 (ref 5.0–8.0)
PROTEIN: NEGATIVE mg/dL
Specific Gravity, Urine: 1.015 (ref 1.005–1.030)
Urobilinogen, UA: 0.2 mg/dL (ref 0.0–1.0)

## 2014-03-27 LAB — URINE CULTURE
COLONY COUNT: NO GROWTH
Organism ID, Bacteria: NO GROWTH

## 2014-03-29 NOTE — Assessment & Plan Note (Signed)
On Levothyroxine, continue to monitor 

## 2014-03-29 NOTE — Assessment & Plan Note (Signed)
Patient is asking to have her O2 qhs  Discontinued. She is no longer using it and says she feels better. She agrees to let her home health agency[facility] come out and perform an overnight pulse ox to confirm she no longer needs it before we d/c

## 2014-03-29 NOTE — Assessment & Plan Note (Signed)
Discussed discontinuing coumadin today, it has been over a year since her PE, but in light of new evidence of increased TE upon discontinuation after a year will continue for now. Is tolerating Coumadin and has generally been stable except for a small bump in INR with Keflex. Will continue to monitor

## 2014-03-29 NOTE — Assessment & Plan Note (Signed)
Avoid offending foods, start probiotics. Do not eat large meals in late evening and consider raising head of bed.  

## 2014-03-29 NOTE — Assessment & Plan Note (Signed)
resolved 

## 2014-03-29 NOTE — Assessment & Plan Note (Signed)
Has been seen by neurology and tremor is tolerable, is comfortable with symptoms as they presently stand

## 2014-03-29 NOTE — Assessment & Plan Note (Signed)
Stable following with nephrology

## 2014-03-30 ENCOUNTER — Telehealth: Payer: Self-pay | Admitting: Family Medicine

## 2014-03-30 NOTE — Telephone Encounter (Signed)
Patient states that for the last 3 days she has been using oxygen and has not been waking up with headaches.

## 2014-03-30 NOTE — Telephone Encounter (Signed)
FYI:  Pt states she would not like to continue with the ox study at this time

## 2014-04-14 ENCOUNTER — Other Ambulatory Visit: Payer: Medicare Other

## 2014-04-14 ENCOUNTER — Telehealth: Payer: Self-pay

## 2014-04-14 DIAGNOSIS — I2699 Other pulmonary embolism without acute cor pulmonale: Secondary | ICD-10-CM

## 2014-04-14 NOTE — Telephone Encounter (Signed)
Yes fingersticks are great

## 2014-04-14 NOTE — Telephone Encounter (Signed)
Dr Ivor Costa patient has received a blood draw for her PT/INR in the past. Is it ok to do finger pricks for future checks?

## 2014-04-15 LAB — PROTIME-INR
INR: 3.07 — ABNORMAL HIGH (ref <1.50)
PROTHROMBIN TIME: 31.7 s — AB (ref 11.6–15.2)

## 2014-04-17 NOTE — Telephone Encounter (Signed)
Spoke with patient and advised that we will be able to do the fingersticks to check PT checks moving forward.  Patient agreeable with plan. Appt scheduled for 10/15 at 2pm

## 2014-04-30 ENCOUNTER — Ambulatory Visit (INDEPENDENT_AMBULATORY_CARE_PROVIDER_SITE_OTHER): Payer: Medicare Other

## 2014-04-30 DIAGNOSIS — Z23 Encounter for immunization: Secondary | ICD-10-CM

## 2014-04-30 NOTE — Progress Notes (Signed)
Pre visit review using our clinic review tool, if applicable. No additional management support is needed unless otherwise documented below in the visit note. 

## 2014-04-30 NOTE — Progress Notes (Signed)
Pt tolerated injection well.  No signs of reaction upon leaving the clinic.

## 2014-05-14 ENCOUNTER — Ambulatory Visit (INDEPENDENT_AMBULATORY_CARE_PROVIDER_SITE_OTHER): Payer: Medicare Other

## 2014-05-14 VITALS — BP 102/66 | HR 70 | Temp 97.8°F | Wt 199.2 lb

## 2014-05-14 DIAGNOSIS — I639 Cerebral infarction, unspecified: Secondary | ICD-10-CM

## 2014-05-14 DIAGNOSIS — I63239 Cerebral infarction due to unspecified occlusion or stenosis of unspecified carotid arteries: Secondary | ICD-10-CM

## 2014-05-14 LAB — POCT INR: INR: 3.4

## 2014-05-14 NOTE — Addendum Note (Signed)
Addended by: Bunnie Domino on: 05/14/2014 03:14 PM   Modules accepted: Level of Service

## 2014-05-14 NOTE — Patient Instructions (Signed)
Hold next dose of Coumadin. Restart on Monday and Thursday 2.5 mg. On Tuesday,Wednesday,Friday,Saturday and Sunday take Coumadin 5 mg. Return to office in 1-2 weeks for recheck of INR.

## 2014-05-21 ENCOUNTER — Ambulatory Visit (INDEPENDENT_AMBULATORY_CARE_PROVIDER_SITE_OTHER): Payer: Medicare Other

## 2014-05-21 VITALS — BP 123/76 | HR 70 | Temp 98.1°F | Wt 201.4 lb

## 2014-05-21 DIAGNOSIS — I63239 Cerebral infarction due to unspecified occlusion or stenosis of unspecified carotid arteries: Secondary | ICD-10-CM

## 2014-05-21 DIAGNOSIS — I639 Cerebral infarction, unspecified: Secondary | ICD-10-CM

## 2014-05-21 DIAGNOSIS — Z7901 Long term (current) use of anticoagulants: Secondary | ICD-10-CM

## 2014-05-21 LAB — POCT INR: INR: 3

## 2014-05-21 NOTE — Progress Notes (Signed)
Pre visit review using our clinic review tool, if applicable. No additional management support is needed unless otherwise documented below in the visit note. 

## 2014-05-21 NOTE — Patient Instructions (Signed)
Take 2.5mg  on Monday and Thursday and all other days take 5mg . Recheck in 3-4 weeks.  Has appt follow up with PCP late November

## 2014-06-29 ENCOUNTER — Ambulatory Visit: Payer: Medicare Other | Admitting: Family Medicine

## 2014-06-30 ENCOUNTER — Ambulatory Visit (INDEPENDENT_AMBULATORY_CARE_PROVIDER_SITE_OTHER): Payer: Medicare Other | Admitting: Family Medicine

## 2014-06-30 ENCOUNTER — Encounter: Payer: Self-pay | Admitting: Family Medicine

## 2014-06-30 VITALS — BP 105/93 | HR 80 | Temp 97.7°F | Ht 64.0 in | Wt 197.0 lb

## 2014-06-30 DIAGNOSIS — Z7901 Long term (current) use of anticoagulants: Secondary | ICD-10-CM

## 2014-06-30 DIAGNOSIS — R946 Abnormal results of thyroid function studies: Secondary | ICD-10-CM

## 2014-06-30 DIAGNOSIS — F329 Major depressive disorder, single episode, unspecified: Secondary | ICD-10-CM

## 2014-06-30 DIAGNOSIS — F419 Anxiety disorder, unspecified: Secondary | ICD-10-CM

## 2014-06-30 DIAGNOSIS — D649 Anemia, unspecified: Secondary | ICD-10-CM

## 2014-06-30 DIAGNOSIS — E785 Hyperlipidemia, unspecified: Secondary | ICD-10-CM

## 2014-06-30 DIAGNOSIS — N189 Chronic kidney disease, unspecified: Secondary | ICD-10-CM

## 2014-06-30 DIAGNOSIS — K5900 Constipation, unspecified: Secondary | ICD-10-CM

## 2014-06-30 DIAGNOSIS — F418 Other specified anxiety disorders: Secondary | ICD-10-CM

## 2014-06-30 DIAGNOSIS — F32A Depression, unspecified: Secondary | ICD-10-CM

## 2014-06-30 DIAGNOSIS — E038 Other specified hypothyroidism: Secondary | ICD-10-CM

## 2014-06-30 DIAGNOSIS — Z5181 Encounter for therapeutic drug level monitoring: Secondary | ICD-10-CM

## 2014-06-30 DIAGNOSIS — K219 Gastro-esophageal reflux disease without esophagitis: Secondary | ICD-10-CM

## 2014-06-30 LAB — POCT INR: INR: 2.9

## 2014-06-30 MED ORDER — CLONAZEPAM 1 MG PO TABS: 1.0000 mg | ORAL_TABLET | Freq: Three times a day (TID) | ORAL | Status: DC | PRN

## 2014-06-30 NOTE — Patient Instructions (Signed)
Hypothyroidism The thyroid is a large gland located in the lower front of your neck. The thyroid gland helps control metabolism. Metabolism is how your body handles food. It controls metabolism with the hormone thyroxine. When this gland is underactive (hypothyroid), it produces too little hormone.  CAUSES These include:   Absence or destruction of thyroid tissue.  Goiter due to iodine deficiency.  Goiter due to medications.  Congenital defects (since birth).  Problems with the pituitary. This causes a lack of TSH (thyroid stimulating hormone). This hormone tells the thyroid to turn out more hormone. SYMPTOMS  Lethargy (feeling as though you have no energy)  Cold intolerance  Weight gain (in spite of normal food intake)  Dry skin  Coarse hair  Menstrual irregularity (if severe, may lead to infertility)  Slowing of thought processes Cardiac problems are also caused by insufficient amounts of thyroid hormone. Hypothyroidism in the newborn is cretinism, and is an extreme form. It is important that this form be treated adequately and immediately or it will lead rapidly to retarded physical and mental development. DIAGNOSIS  To prove hypothyroidism, your caregiver may do blood tests and ultrasound tests. Sometimes the signs are hidden. It may be necessary for your caregiver to watch this illness with blood tests either before or after diagnosis and treatment. TREATMENT  Low levels of thyroid hormone are increased by using synthetic thyroid hormone. This is a safe, effective treatment. It usually takes about four weeks to gain the full effects of the medication. After you have the full effect of the medication, it will generally take another four weeks for problems to leave. Your caregiver may start you on low doses. If you have had heart problems the dose may be gradually increased. It is generally not an emergency to get rapidly to normal. HOME CARE INSTRUCTIONS   Take your  medications as your caregiver suggests. Let your caregiver know of any medications you are taking or start taking. Your caregiver will help you with dosage schedules.  As your condition improves, your dosage needs may increase. It will be necessary to have continuing blood tests as suggested by your caregiver.  Report all suspected medication side effects to your caregiver. SEEK MEDICAL CARE IF: Seek medical care if you develop:  Sweating.  Tremulousness (tremors).  Anxiety.  Rapid weight loss.  Heat intolerance.  Emotional swings.  Diarrhea.  Weakness. SEEK IMMEDIATE MEDICAL CARE IF:  You develop chest pain, an irregular heart beat (palpitations), or a rapid heart beat. MAKE SURE YOU:   Understand these instructions.  Will watch your condition.  Will get help right away if you are not doing well or get worse. Document Released: 07/17/2005 Document Revised: 10/09/2011 Document Reviewed: 03/06/2008 Exit[employer] Patient Information 2015 [employer]. This information is not intended to replace advice given to you by your health care provider. Make sure you discuss any questions you have with your health care provider.  

## 2014-06-30 NOTE — Progress Notes (Signed)
Pre visit review using our clinic review tool, if applicable. No additional management support is needed unless otherwise documented below in the visit note. 

## 2014-07-01 LAB — HEPATIC FUNCTION PANEL
ALK PHOS: 44 U/L (ref 39–117)
ALT: 16 U/L (ref 0–35)
AST: 28 U/L (ref 0–37)
Albumin: 4.5 g/dL (ref 3.5–5.2)
BILIRUBIN DIRECT: 0.1 mg/dL (ref 0.0–0.3)
BILIRUBIN TOTAL: 0.7 mg/dL (ref 0.2–1.2)
Total Protein: 7.7 g/dL (ref 6.0–8.3)

## 2014-07-01 LAB — CBC
HEMATOCRIT: 34.8 % — AB (ref 36.0–46.0)
Hemoglobin: 11.6 g/dL — ABNORMAL LOW (ref 12.0–15.0)
MCHC: 33.4 g/dL (ref 30.0–36.0)
MCV: 90.6 fl (ref 78.0–100.0)
Platelets: 223 10*3/uL (ref 150.0–400.0)
RBC: 3.85 Mil/uL — AB (ref 3.87–5.11)
RDW: 13.9 % (ref 11.5–15.5)
WBC: 7.8 10*3/uL (ref 4.0–10.5)

## 2014-07-01 LAB — LIPID PANEL
Cholesterol: 232 mg/dL — ABNORMAL HIGH (ref 0–200)
HDL: 45 mg/dL (ref >39.00)
NonHDL: 187
Total CHOL/HDL Ratio: 5
Triglycerides: 356 mg/dL — ABNORMAL HIGH (ref 0.0–149.0)
VLDL: 71.2 mg/dL — AB (ref 0.0–40.0)

## 2014-07-01 LAB — RENAL FUNCTION PANEL
ALBUMIN: 4.5 g/dL (ref 3.5–5.2)
BUN: 40 mg/dL — ABNORMAL HIGH (ref 6–23)
CALCIUM: 10.1 mg/dL (ref 8.4–10.5)
CO2: 22 mEq/L (ref 19–32)
CREATININE: 2.2 mg/dL — AB (ref 0.4–1.2)
Chloride: 111 mEq/L (ref 96–112)
GFR: 23.84 mL/min — ABNORMAL LOW (ref >60.00)
Glucose, Bld: 96 mg/dL (ref 70–99)
Phosphorus: 2.6 mg/dL (ref 2.3–4.6)
Potassium: 4.4 mEq/L (ref 3.5–5.1)
Sodium: 145 mEq/L (ref 135–145)

## 2014-07-01 LAB — TSH: TSH: 0.39 u[IU]/mL (ref 0.35–4.50)

## 2014-07-01 LAB — T4, FREE: Free T4: 1.12 ng/dL (ref 0.60–1.60)

## 2014-07-02 LAB — LDL CHOLESTEROL, DIRECT: Direct LDL: 136.1 mg/dL

## 2014-07-05 ENCOUNTER — Encounter: Payer: Self-pay | Admitting: Family Medicine

## 2014-07-05 DIAGNOSIS — D649 Anemia, unspecified: Secondary | ICD-10-CM

## 2014-07-05 HISTORY — DX: Anemia, unspecified: D64.9

## 2014-07-05 NOTE — Assessment & Plan Note (Signed)
On Levothyroxine, continue to monitor. TSH and free T4 WNL

## 2014-07-05 NOTE — Assessment & Plan Note (Signed)
Encouraged increased hydration and fiber in diet. Daily probiotics. If bowels not moving can use MOM 2 tbls po in 4 oz of warm prune juice by mouth every 2-3 days. If no results then repeat in 4 hours with  Dulcolax suppository pr, may repeat again in 4 more hours as needed. Seek care if symptoms worsen. Consider daily Miralax and/or Dulcolax if symptoms persist.  

## 2014-07-05 NOTE — Assessment & Plan Note (Signed)
Creatinine doing well. Maintain adequate hydration

## 2014-07-05 NOTE — Assessment & Plan Note (Signed)
Mild but new proceed with IFOB and increase iron in diet repeat CBC

## 2014-07-05 NOTE — Progress Notes (Signed)
Emily Mitchell  [id number] 1945/08/30 07/05/2014      Progress Note-Follow Up  Subjective  Chief Complaint  Chief Complaint  Patient presents with  . Follow-up    3 month    HPI  Patient is a 68 y.o. female in today for routine medical care. Patient c/o worsening cold intolerance, fatigue and hair loss. No other recent illness. No acute complaints. Bowels are moving somewhat beter with only occasional straining. Denies CP/palp/SOB/HA/congestion/fevers/GI or GU c/o. Taking meds as prescribed  Past Medical History  Diagnosis Date  . Carotid artery occlusion   . Hyperlipidemia   . Hypertension   . COPD (chronic obstructive pulmonary disease)   . Chicken pox as a child  . Mumps 4 yrs old  . GERD (gastroesophageal reflux disease)   . Asthma   . Arthritis   . Hypothyroidism   . Chronic kidney disease     Bright's Disease at age 61   . Tremor   . CHF (congestive heart failure)   . Anxiety and depression 12/14/2008    Qualifier: Diagnosis of  By: [name] LPN, Almyra Free    . Pulmonary emboli 05/26/2013  . H. pylori infection 10/19/2013  . UTI (lower urinary tract infection) 10/19/2013    Past Surgical History  Procedure Laterality Date  . Carotid endarterectomy Right 05/10/07    cea  . Knees replaced  2005 and 2011    both knees  . Wisdom tooth extraction    . Abdominal hysterectomy  1984  . Cataract extraction Left     Family History  Problem Relation Age of Onset  . Stroke Mother     mini stroke  . Kidney disease Mother   . Heart failure Mother   . Hypertension Mother   . Diabetes Sister     type 2  . Hyperlipidemia Sister     History   Social History  . Marital Status: Divorced    Spouse Name: N/A    Number of Children: 2  . Years of Education: N/A   Occupational History  . Not on file.   Social History Main Topics  . Smoking status: Former Smoker -- 2.00 packs/day for 30 years    Types: Cigarettes    Quit date: 12/03/1994  . Smokeless tobacco:  Never Used  . Alcohol Use: No  . Drug Use: No  . Sexual Activity: No   Other Topics Concern  . Not on file   Social History Narrative    Current Outpatient Prescriptions on File Prior to Visit  Medication Sig Dispense Refill  . acetaminophen (TYLENOL) 500 MG tablet Take 500 mg by mouth every 6 (six) hours as needed.    Marland Kitchen albuterol (PROVENTIL HFA;VENTOLIN HFA) 108 (90 BASE) MCG/ACT inhaler Inhale 2 puffs into the lungs every 4 (four) hours as needed for wheezing.    . calcium citrate-vitamin D (CITRACAL+D) 315-200 MG-UNIT per tablet Take 1 tablet by mouth daily.     . Cholecalciferol (D3 MAXIMUM STRENGTH) 5000 UNITS capsule Take 5,000 Units by mouth daily.    . Choline Fenofibrate (TRILIPIX) 135 MG capsule Take 135 mg by mouth daily.    . citalopram (CELEXA) 20 MG tablet Take 20 mg by mouth daily.    . Cyanocobalamin (VITAMIN B-12 PO) Take by mouth.    . esomeprazole (NEXIUM) 40 MG capsule Take 1 capsule (40 mg total) by mouth daily at 12 noon. 90 capsule 3  . ezetimibe (ZETIA) 10 MG tablet Take 1 tablet (10 mg total)  by mouth daily. 90 tablet 3  . Fluticasone-Salmeterol (ADVAIR) 250-50 MCG/DOSE AEPB Inhale 1 puff into the lungs every 12 (twelve) hours. 180 each 3  . furosemide (LASIX) 20 MG tablet Take 1 tablet (20 mg total) by mouth daily as needed for fluid or edema (weight gain >3# in 24, sob). 30 tablet 3  . levalbuterol (XOPENEX) 1.25 MG/0.5ML nebulizer solution Take 1.25 mg by nebulization every 4 (four) hours as needed for wheezing. 30 each 1  . levothyroxine (SYNTHROID, LEVOTHROID) 137 MCG tablet Take 1 tablet (137 mcg total) by mouth daily before breakfast. 90 tablet 3  . montelukast (SINGULAIR) 10 MG tablet Take 10 mg by mouth at bedtime. Does not take Wed & Sun    . Multiple Vitamins-Minerals (PRESERVISION AREDS PO) Take 10 g by mouth every morning.    . nitroGLYCERIN (NITROSTAT) 0.4 MG SL tablet Place 1 tablet (0.4 mg total) under the tongue every 5 (five) minutes as needed  for chest pain. 25 tablet 3  . telmisartan-hydrochlorothiazide (MICARDIS HCT) 80-12.5 MG per tablet Take 0.5 tablets by mouth daily. Marland Kitchen5 tablet daily 45 tablet 3  . traMADol (ULTRAM) 50 MG tablet Take 1 tablet (50 mg total) by mouth every 8 (eight) hours as needed for moderate pain or severe pain. 90 tablet 0  . warfarin (COUMADIN) 5 MG tablet 5 mg tab daily or alter as directed (Patient taking differently: Take 2.5 mg Monday and Thursday, 5 mg tab all other days) 90 tablet 1   No current facility-administered medications on file prior to visit.    Allergies  Allergen Reactions  . Niaspan [Niacin Er] Nausea And Vomiting and Swelling    Swelling in mouth  . Pantoprazole     Mouth sores  . Bupropion Other (See Comments)    Uncontrollable shakes  . Penicillins Hives  . Statins   . Sulfonamide Derivatives Hives    Review of Systems  Review of Systems  Constitutional: Positive for chills and malaise/fatigue. Negative for fever.  HENT: Negative for congestion.   Eyes: Negative for discharge.  Respiratory: Negative for shortness of breath.   Cardiovascular: Negative for chest pain, palpitations and leg swelling.  Gastrointestinal: Positive for heartburn. Negative for nausea, abdominal pain and diarrhea.  Genitourinary: Negative for dysuria.  Musculoskeletal: Positive for myalgias. Negative for falls.  Skin: Negative for rash.  Neurological: Negative for loss of consciousness and headaches.  Endo/Heme/Allergies: Negative for polydipsia.  Psychiatric/Behavioral: Negative for depression and suicidal ideas. The patient is not nervous/anxious and does not have insomnia.     Objective  BP 105/93 mmHg  Pulse 80  Temp(Src) 97.7 F (36.5 C) (Oral)  Ht 5\' 4"  (1.626 m)  Wt 197 lb (89.359 kg)  BMI 33.80 kg/m2  SpO2 91%  Physical Exam  Physical Exam  Constitutional: She is oriented to person, place, and time and well-developed, well-nourished, and in no distress. No distress.  HENT:    Head: Normocephalic and atraumatic.  Eyes: Conjunctivae are normal.  Neck: Neck supple. No thyromegaly present.  Cardiovascular: Normal rate, regular rhythm and normal heart sounds.   No murmur heard. Pulmonary/Chest: Effort normal and breath sounds normal. She has no wheezes.  Abdominal: She exhibits no distension and no mass.  Musculoskeletal: She exhibits no edema.  Lymphadenopathy:    She has no cervical adenopathy.  Neurological: She is alert and oriented to person, place, and time.  Skin: Skin is warm and dry. No rash noted. She is not diaphoretic.  Psychiatric: Memory, affect and judgment  normal.    Lab Results  Component Value Date   TSH 0.39 06/30/2014   Lab Results  Component Value Date   WBC 7.8 06/30/2014   HGB 11.6* 06/30/2014   HCT 34.8* 06/30/2014   MCV 90.6 06/30/2014   PLT 223.0 06/30/2014   Lab Results  Component Value Date   CREATININE 2.2* 06/30/2014   BUN 40* 06/30/2014   NA 145 06/30/2014   K 4.4 06/30/2014   CL 111 06/30/2014   CO2 22 06/30/2014   Lab Results  Component Value Date   ALT 16 06/30/2014   AST 28 06/30/2014   ALKPHOS 44 06/30/2014   BILITOT 0.7 06/30/2014   Lab Results  Component Value Date   CHOL 232* 06/30/2014   Lab Results  Component Value Date   HDL 45.00 06/30/2014   Lab Results  Component Value Date   LDLCALC 83 10/16/2013   Lab Results  Component Value Date   TRIG 356.0* 06/30/2014   Lab Results  Component Value Date   CHOLHDL 5 06/30/2014     Assessment & Plan  GERD Avoid offending foods, start probiotics. Do not eat large meals in late evening and consider raising head of bed.   Hypothyroidism On Levothyroxine, continue to monitor. TSH and free T4 WNL  Constipation Encouraged increased hydration and fiber in diet. Daily probiotics. If bowels not moving can use MOM 2 tbls po in 4 oz of warm prune juice by mouth every 2-3 days. If no results then repeat in 4 hours with  Dulcolax suppository pr, may  repeat again in 4 more hours as needed. Seek care if symptoms worsen. Consider daily Miralax and/or Dulcolax if symptoms persist.   Chronic kidney disease Creatinine doing well. Maintain adequate hydration  Anemia Mild but new proceed with IFOB and increase iron in diet repeat CBC

## 2014-07-05 NOTE — Assessment & Plan Note (Signed)
Avoid offending foods, start probiotics. Do not eat large meals in late evening and consider raising head of bed.  

## 2014-08-04 ENCOUNTER — Ambulatory Visit (INDEPENDENT_AMBULATORY_CARE_PROVIDER_SITE_OTHER): Payer: Medicare Other

## 2014-08-04 VITALS — BP 150/81 | HR 66 | Temp 97.8°F | Wt 194.2 lb

## 2014-08-04 DIAGNOSIS — I2699 Other pulmonary embolism without acute cor pulmonale: Secondary | ICD-10-CM | POA: Diagnosis not present

## 2014-08-04 DIAGNOSIS — Z7901 Long term (current) use of anticoagulants: Secondary | ICD-10-CM | POA: Diagnosis not present

## 2014-08-04 LAB — POCT INR: INR: 2.6

## 2014-08-04 NOTE — Progress Notes (Signed)
Would like to have INR drawn on the day she comes in for labs on 09/07/14.  Future PT/INR ordered.

## 2014-08-04 NOTE — Progress Notes (Signed)
Pre visit review using our clinic review tool, if applicable. No additional management support is needed unless otherwise documented below in the visit note. 

## 2014-08-04 NOTE — Patient Instructions (Signed)
Take 2.5mg  on Monday and Thursday and all other days take 5mg . Recheck in 4 weeks.

## 2014-08-10 DIAGNOSIS — J449 Chronic obstructive pulmonary disease, unspecified: Secondary | ICD-10-CM | POA: Diagnosis not present

## 2014-09-07 ENCOUNTER — Other Ambulatory Visit: Payer: Medicare Other

## 2014-09-07 ENCOUNTER — Other Ambulatory Visit (INDEPENDENT_AMBULATORY_CARE_PROVIDER_SITE_OTHER): Payer: No Typology Code available for payment source

## 2014-09-07 DIAGNOSIS — Z7901 Long term (current) use of anticoagulants: Secondary | ICD-10-CM

## 2014-09-07 DIAGNOSIS — I2699 Other pulmonary embolism without acute cor pulmonale: Secondary | ICD-10-CM

## 2014-09-07 LAB — PROTIME-INR
INR: 1.8 ratio — AB (ref 0.8–1.0)
Prothrombin Time: 19.9 s — ABNORMAL HIGH (ref 9.6–13.1)

## 2014-09-14 ENCOUNTER — Ambulatory Visit: Payer: No Typology Code available for payment source | Admitting: Family Medicine

## 2014-09-15 ENCOUNTER — Encounter: Payer: Self-pay | Admitting: Family Medicine

## 2014-09-15 ENCOUNTER — Ambulatory Visit (INDEPENDENT_AMBULATORY_CARE_PROVIDER_SITE_OTHER): Payer: No Typology Code available for payment source | Admitting: Family Medicine

## 2014-09-15 VITALS — BP 108/62 | HR 96 | Temp 98.2°F | Ht 64.0 in | Wt 195.1 lb

## 2014-09-15 DIAGNOSIS — F419 Anxiety disorder, unspecified: Principal | ICD-10-CM

## 2014-09-15 DIAGNOSIS — F32A Depression, unspecified: Secondary | ICD-10-CM

## 2014-09-15 DIAGNOSIS — F418 Other specified anxiety disorders: Secondary | ICD-10-CM

## 2014-09-15 DIAGNOSIS — H3530 Unspecified macular degeneration: Secondary | ICD-10-CM

## 2014-09-15 DIAGNOSIS — K219 Gastro-esophageal reflux disease without esophagitis: Secondary | ICD-10-CM

## 2014-09-15 DIAGNOSIS — I509 Heart failure, unspecified: Secondary | ICD-10-CM

## 2014-09-15 DIAGNOSIS — F329 Major depressive disorder, single episode, unspecified: Secondary | ICD-10-CM

## 2014-09-15 DIAGNOSIS — E039 Hypothyroidism, unspecified: Secondary | ICD-10-CM

## 2014-09-15 MED ORDER — RANITIDINE HCL 300 MG PO TABS: 300.0000 mg | ORAL_TABLET | Freq: Every day | ORAL | Status: DC

## 2014-09-15 MED ORDER — CLONAZEPAM 1 MG PO TABS: 1.0000 mg | ORAL_TABLET | Freq: Three times a day (TID) | ORAL | Status: DC | PRN

## 2014-09-15 NOTE — Patient Instructions (Signed)

## 2014-09-15 NOTE — Progress Notes (Signed)
Pre visit review using our clinic review tool, if applicable. No additional management support is needed unless otherwise documented below in the visit note. 

## 2014-09-16 ENCOUNTER — Other Ambulatory Visit (INDEPENDENT_AMBULATORY_CARE_PROVIDER_SITE_OTHER): Payer: No Typology Code available for payment source

## 2014-09-16 DIAGNOSIS — D649 Anemia, unspecified: Secondary | ICD-10-CM

## 2014-09-16 LAB — FECAL OCCULT BLOOD, IMMUNOCHEMICAL: FECAL OCCULT BLD: POSITIVE — AB

## 2014-09-17 ENCOUNTER — Other Ambulatory Visit: Payer: Self-pay | Admitting: Family Medicine

## 2014-09-17 DIAGNOSIS — R195 Other fecal abnormalities: Secondary | ICD-10-CM

## 2014-09-23 ENCOUNTER — Ambulatory Visit (HOSPITAL_BASED_OUTPATIENT_CLINIC_OR_DEPARTMENT_OTHER)
Admission: RE | Admit: 2014-09-23 | Discharge: 2014-09-23 | Disposition: A | Discharge status: Home / Self Care | Payer: Medicare Other | Source: Ambulatory Visit | Attending: Family Medicine | Admitting: Family Medicine

## 2014-09-23 DIAGNOSIS — I509 Heart failure, unspecified: Secondary | ICD-10-CM | POA: Insufficient documentation

## 2014-09-23 NOTE — Progress Notes (Signed)
Echocardiogram 2D Echocardiogram has been performed.  Emily Mitchell 09/23/2014, 9:26 AM

## 2014-09-25 ENCOUNTER — Ambulatory Visit: Payer: No Typology Code available for payment source | Admitting: Gastroenterology

## 2014-09-28 ENCOUNTER — Encounter: Payer: Self-pay | Admitting: Family Medicine

## 2014-09-28 DIAGNOSIS — H3530 Unspecified macular degeneration: Secondary | ICD-10-CM | POA: Insufficient documentation

## 2014-09-28 NOTE — Assessment & Plan Note (Signed)
Diastolic on recent echo. With increasing symptoms of SOB will refer to cardiology for further consideration

## 2014-09-28 NOTE — Assessment & Plan Note (Signed)
On Levothyroxine, continue to monitor 

## 2014-09-28 NOTE — Assessment & Plan Note (Signed)
With IFOB positive, referred to gastroenterology, Increase leafy greens, consider increased lean red meat and using cast iron cookware. Continue to monitor, report any concerns

## 2014-09-28 NOTE — Progress Notes (Signed)
Emily Mitchell  [id number] 1946/04/21 09/28/2014      Progress Note-Follow Up  Subjective  Chief Complaint  Chief Complaint  Patient presents with  . Follow-up    HPI  Patient is a 69 y.o. female in today for routine medical care. Patient is in today for follow-up. Recent illness. She has been struggling with some fatigue. Denies any fevers or recent trips to the emergency room. Denies CP/palp/SOB/HA/congestion/fevers/GI or GU c/o. Taking meds as prescribed  Past Medical History  Diagnosis Date  . Carotid artery occlusion   . Hyperlipidemia   . Hypertension   . COPD (chronic obstructive pulmonary disease)   . Chicken pox as a child  . Mumps 66 yrs old  . GERD (gastroesophageal reflux disease)   . Asthma   . Arthritis   . Hypothyroidism   . Chronic kidney disease     Bright's Disease at age 57   . Tremor   . CHF (congestive heart failure)   . Anxiety and depression 12/14/2008    Qualifier: Diagnosis of  By: [name] LPN, Almyra Free    . Pulmonary emboli 05/26/2013  . H. pylori infection 10/19/2013  . UTI (lower urinary tract infection) 10/19/2013  . Anemia 07/05/2014    Past Surgical History  Procedure Laterality Date  . Carotid endarterectomy Right 05/10/07    cea  . Knees replaced  2005 and 2011    both knees  . Wisdom tooth extraction    . Abdominal hysterectomy  1984  . Cataract extraction Left     Family History  Problem Relation Age of Onset  . Stroke Mother     mini stroke  . Kidney disease Mother   . Heart failure Mother   . Hypertension Mother   . Diabetes Sister     type 2  . Hyperlipidemia Sister     History   Social History  . Marital Status: Divorced    Spouse Name: N/A  . Number of Children: 2  . Years of Education: N/A   Occupational History  . Not on file.   Social History Main Topics  . Smoking status: Former Smoker -- 2.00 packs/day for 30 years    Types: Cigarettes    Quit date: 12/03/1994  . Smokeless tobacco: Never Used  .  Alcohol Use: No  . Drug Use: No  . Sexual Activity: No   Other Topics Concern  . Not on file   Social History Narrative    Current Outpatient Prescriptions on File Prior to Visit  Medication Sig Dispense Refill  . acetaminophen (TYLENOL) 500 MG tablet Take 500 mg by mouth every 6 (six) hours as needed.    Marland Kitchen albuterol (PROVENTIL HFA;VENTOLIN HFA) 108 (90 BASE) MCG/ACT inhaler Inhale 2 puffs into the lungs every 4 (four) hours as needed for wheezing.    . calcium citrate-vitamin D (CITRACAL+D) 315-200 MG-UNIT per tablet Take 1 tablet by mouth daily.     . Cholecalciferol (D3 MAXIMUM STRENGTH) 5000 UNITS capsule Take 5,000 Units by mouth daily.    . Choline Fenofibrate (TRILIPIX) 135 MG capsule Take 135 mg by mouth daily.    . citalopram (CELEXA) 20 MG tablet Take 20 mg by mouth daily.    . Cyanocobalamin (VITAMIN B-12 PO) Take by mouth.    . esomeprazole (NEXIUM) 40 MG capsule Take 1 capsule (40 mg total) by mouth daily at 12 noon. 90 capsule 3  . ezetimibe (ZETIA) 10 MG tablet Take 1 tablet (10 mg total) by mouth  daily. 90 tablet 3  . Fluticasone-Salmeterol (ADVAIR) 250-50 MCG/DOSE AEPB Inhale 1 puff into the lungs every 12 (twelve) hours. 180 each 3  . furosemide (LASIX) 20 MG tablet Take 1 tablet (20 mg total) by mouth daily as needed for fluid or edema (weight gain >3# in 24, sob). 30 tablet 3  . levalbuterol (XOPENEX) 1.25 MG/0.5ML nebulizer solution Take 1.25 mg by nebulization every 4 (four) hours as needed for wheezing. 30 each 1  . levothyroxine (SYNTHROID, LEVOTHROID) 137 MCG tablet Take 1 tablet (137 mcg total) by mouth daily before breakfast. 90 tablet 3  . montelukast (SINGULAIR) 10 MG tablet Take 10 mg by mouth at bedtime. Does not take Wed & Sun    . Multiple Vitamins-Minerals (PRESERVISION AREDS PO) Take 10 g by mouth every morning.    . nitroGLYCERIN (NITROSTAT) 0.4 MG SL tablet Place 1 tablet (0.4 mg total) under the tongue every 5 (five) minutes as needed for chest pain.  25 tablet 3  . traMADol (ULTRAM) 50 MG tablet Take 1 tablet (50 mg total) by mouth every 8 (eight) hours as needed for moderate pain or severe pain. 90 tablet 0  . warfarin (COUMADIN) 5 MG tablet 5 mg tab daily or alter as directed (Patient taking differently: Take 2.5 mg Monday and Thursday, 5 mg tab all other days) 90 tablet 1   No current facility-administered medications on file prior to visit.    Allergies  Allergen Reactions  . Niaspan [Niacin Er] Nausea And Vomiting and Swelling    Swelling in mouth  . Pantoprazole     Mouth sores  . Bupropion Other (See Comments)    Uncontrollable shakes  . Penicillins Hives  . Statins   . Sulfonamide Derivatives Hives    Review of Systems  Review of Systems  Constitutional: Negative for fever and malaise/fatigue.  HENT: Negative for congestion.   Eyes: Negative for discharge.  Respiratory: Negative for shortness of breath.   Cardiovascular: Negative for chest pain, palpitations and leg swelling.  Gastrointestinal: Negative for nausea, abdominal pain and diarrhea.  Genitourinary: Negative for dysuria.  Musculoskeletal: Negative for falls.  Skin: Negative for rash.  Neurological: Positive for tremors. Negative for loss of consciousness and headaches.  Endo/Heme/Allergies: Negative for polydipsia.  Psychiatric/Behavioral: Negative for depression and suicidal ideas. The patient is not nervous/anxious and does not have insomnia.     Objective  BP 108/62 mmHg  Pulse 96  Temp(Src) 98.2 F (36.8 C) (Oral)  Ht 5\' 4"  (1.626 m)  Wt 195 lb 2 oz (88.508 kg)  BMI 33.48 kg/m2  SpO2 93%  Physical Exam  Physical Exam  Constitutional: She is oriented to person, place, and time and well-developed, well-nourished, and in no distress. No distress.  HENT:  Head: Normocephalic and atraumatic.  Eyes: Conjunctivae are normal.  Neck: Neck supple. No thyromegaly present.  Cardiovascular: Normal rate, regular rhythm and normal heart sounds.   No  murmur heard. Pulmonary/Chest: Effort normal and breath sounds normal. She has no wheezes.  Abdominal: She exhibits no distension and no mass.  Musculoskeletal: She exhibits no edema.  Lymphadenopathy:    She has no cervical adenopathy.  Neurological: She is alert and oriented to person, place, and time.  Skin: Skin is warm and dry. No rash noted. She is not diaphoretic.  Psychiatric: Memory, affect and judgment normal.    Lab Results  Component Value Date   TSH 0.39 06/30/2014   Lab Results  Component Value Date   WBC 7.8 06/30/2014  HGB 11.6* 06/30/2014   HCT 34.8* 06/30/2014   MCV 90.6 06/30/2014   PLT 223.0 06/30/2014   Lab Results  Component Value Date   CREATININE 2.2* 06/30/2014   BUN 40* 06/30/2014   NA 145 06/30/2014   K 4.4 06/30/2014   CL 111 06/30/2014   CO2 22 06/30/2014   Lab Results  Component Value Date   ALT 16 06/30/2014   AST 28 06/30/2014   ALKPHOS 44 06/30/2014   BILITOT 0.7 06/30/2014   Lab Results  Component Value Date   CHOL 232* 06/30/2014   Lab Results  Component Value Date   HDL 45.00 06/30/2014   Lab Results  Component Value Date   LDLCALC 83 10/16/2013   Lab Results  Component Value Date   TRIG 356.0* 06/30/2014   Lab Results  Component Value Date   CHOLHDL 5 06/30/2014     Assessment & Plan  CHF (congestive heart failure) Diastolic on recent echo. With increasing symptoms of SOB will refer to cardiology for further consideration   GERD Avoid offending foods, start probiotics. Do not eat large meals in late evening and consider raising head of bed.    Hypothyroidism On Levothyroxine, continue to monitor   ANEMIA With IFOB positive, referred to gastroenterology, Increase leafy greens, consider increased lean red meat and using cast iron cookware. Continue to monitor, report any concerns   Macular degeneration Referred to opthamology, is in need of a new opthamologist no recent visual changes.

## 2014-09-28 NOTE — Assessment & Plan Note (Signed)
Referred to opthamology, is in need of a new opthamologist no recent visual changes.

## 2014-09-28 NOTE — Assessment & Plan Note (Signed)
Avoid offending foods, start probiotics. Do not eat large meals in late evening and consider raising head of bed.  

## 2014-10-13 ENCOUNTER — Ambulatory Visit (INDEPENDENT_AMBULATORY_CARE_PROVIDER_SITE_OTHER): Payer: No Typology Code available for payment source | Admitting: *Deleted

## 2014-10-13 VITALS — BP 120/57 | HR 80 | Temp 98.2°F | Resp 16 | Wt 198.8 lb

## 2014-10-13 DIAGNOSIS — I2699 Other pulmonary embolism without acute cor pulmonale: Secondary | ICD-10-CM | POA: Diagnosis not present

## 2014-10-13 DIAGNOSIS — Z86711 Personal history of pulmonary embolism: Secondary | ICD-10-CM

## 2014-10-13 DIAGNOSIS — Z7901 Long term (current) use of anticoagulants: Secondary | ICD-10-CM | POA: Diagnosis not present

## 2014-10-13 LAB — POCT INR: INR: 2

## 2014-10-13 MED ORDER — WARFARIN SODIUM 5 MG PO TABS
ORAL_TABLET | ORAL | Status: DC
Start: 2014-10-13 — End: 2015-06-25

## 2014-10-13 NOTE — Progress Notes (Signed)
Pre visit review using our clinic review tool, if applicable. No additional management support is needed unless otherwise documented below in the visit note. 

## 2014-10-13 NOTE — Patient Instructions (Signed)
Continue to take 2.5mg  on Monday and Thursday and all other days take 5mg . Recheck in 4 weeks.

## 2014-11-13 ENCOUNTER — Ambulatory Visit (INDEPENDENT_AMBULATORY_CARE_PROVIDER_SITE_OTHER): Payer: Medicare Other | Admitting: *Deleted

## 2014-11-13 ENCOUNTER — Telehealth: Payer: Self-pay | Admitting: *Deleted

## 2014-11-13 VITALS — BP 106/64 | HR 70 | Temp 98.7°F | Resp 16 | Wt 197.0 lb

## 2014-11-13 DIAGNOSIS — E785 Hyperlipidemia, unspecified: Secondary | ICD-10-CM

## 2014-11-13 DIAGNOSIS — Z7901 Long term (current) use of anticoagulants: Secondary | ICD-10-CM

## 2014-11-13 DIAGNOSIS — F32A Depression, unspecified: Secondary | ICD-10-CM

## 2014-11-13 DIAGNOSIS — K219 Gastro-esophageal reflux disease without esophagitis: Secondary | ICD-10-CM | POA: Diagnosis not present

## 2014-11-13 DIAGNOSIS — F329 Major depressive disorder, single episode, unspecified: Secondary | ICD-10-CM

## 2014-11-13 DIAGNOSIS — F419 Anxiety disorder, unspecified: Principal | ICD-10-CM

## 2014-11-13 LAB — POCT INR: INR: 2.6

## 2014-11-13 MED ORDER — MONTELUKAST SODIUM 10 MG PO TABS: 10.0000 mg | ORAL_TABLET | Freq: Every day | ORAL | Status: DC

## 2014-11-13 MED ORDER — EZETIMIBE 10 MG PO TABS: 10.0000 mg | ORAL_TABLET | Freq: Every day | ORAL | Status: DC

## 2014-11-13 MED ORDER — ESOMEPRAZOLE MAGNESIUM 40 MG PO CPDR: 40.0000 mg | DELAYED_RELEASE_CAPSULE | Freq: Every day | ORAL | Status: DC

## 2014-11-13 MED ORDER — CITALOPRAM HYDROBROMIDE 20 MG PO TABS: 20.0000 mg | ORAL_TABLET | Freq: Every day | ORAL | Status: DC

## 2014-11-13 NOTE — Telephone Encounter (Signed)
Patient requesting refill of Tramadol and Clonopin.    Tramadol last refilled 12/12/13 for 90 and 0 Clonopin last refilled 09/15/14 for 90 and 0 Last office visit 09/15/14 and she is scheduled 12/07/14.  No UDS or contract found.  Please advise.

## 2014-11-13 NOTE — Patient Instructions (Signed)
Continue to take 2.5mg  on Monday and Thursday and all other days take 5mg . Recheck in 4 weeks.

## 2014-11-13 NOTE — Telephone Encounter (Signed)
OK to refill both meds but explain and obtain UDS and contract

## 2014-11-13 NOTE — Progress Notes (Signed)
Pre visit review using our clinic review tool, if applicable. No additional management support is needed unless otherwise documented below in the visit note. 

## 2014-11-16 ENCOUNTER — Encounter: Payer: Self-pay | Admitting: Family Medicine

## 2014-11-16 MED ORDER — CLONAZEPAM 1 MG PO TABS: 1.0000 mg | ORAL_TABLET | Freq: Three times a day (TID) | ORAL | Status: DC | PRN

## 2014-11-16 MED ORDER — TRAMADOL HCL 50 MG PO TABS: 50.0000 mg | ORAL_TABLET | Freq: Three times a day (TID) | ORAL | Status: DC | PRN

## 2014-11-16 NOTE — Telephone Encounter (Signed)
Printed prescriptions and now on counter for signature from MD. Chartered loss adjuster. Called the patient informed to pickup hardcopy's at the front desk after signing contract and doing UDS.  Did explain all to the patient and she did verbalize understanding and agreement.

## 2014-12-07 ENCOUNTER — Encounter: Payer: Self-pay | Admitting: Family Medicine

## 2014-12-07 ENCOUNTER — Ambulatory Visit (INDEPENDENT_AMBULATORY_CARE_PROVIDER_SITE_OTHER): Payer: Medicare Other | Admitting: Family Medicine

## 2014-12-07 VITALS — BP 110/86 | HR 87 | Temp 97.8°F | Ht 64.0 in | Wt 193.0 lb

## 2014-12-07 DIAGNOSIS — E785 Hyperlipidemia, unspecified: Secondary | ICD-10-CM | POA: Diagnosis not present

## 2014-12-07 DIAGNOSIS — Z86711 Personal history of pulmonary embolism: Secondary | ICD-10-CM | POA: Diagnosis not present

## 2014-12-07 DIAGNOSIS — E782 Mixed hyperlipidemia: Secondary | ICD-10-CM | POA: Diagnosis not present

## 2014-12-07 DIAGNOSIS — H3530 Unspecified macular degeneration: Secondary | ICD-10-CM

## 2014-12-07 DIAGNOSIS — D649 Anemia, unspecified: Secondary | ICD-10-CM

## 2014-12-07 DIAGNOSIS — E038 Other specified hypothyroidism: Secondary | ICD-10-CM

## 2014-12-07 DIAGNOSIS — E039 Hypothyroidism, unspecified: Secondary | ICD-10-CM

## 2014-12-07 DIAGNOSIS — I2699 Other pulmonary embolism without acute cor pulmonale: Secondary | ICD-10-CM

## 2014-12-07 LAB — POCT INR: INR: 3.1

## 2014-12-07 MED ORDER — CHOLINE FENOFIBRATE 135 MG PO CPDR: 135.0000 mg | DELAYED_RELEASE_CAPSULE | Freq: Every day | ORAL | Status: DC

## 2014-12-07 MED ORDER — FUROSEMIDE 20 MG PO TABS: 20.0000 mg | ORAL_TABLET | Freq: Every day | ORAL | Status: DC | PRN

## 2014-12-07 NOTE — Progress Notes (Signed)
Pre visit review using our clinic review tool, if applicable. No additional management support is needed unless otherwise documented below in the visit note. 

## 2014-12-07 NOTE — Patient Instructions (Signed)
Age-Related Macular Degeneration Age-related macular degeneration (AMD) is a common eye disease related to aging. AMD slowly destroys the macula, which is the focusing part of the eye that provides sharp, central vision. Central vision is needed for seeing objects clearly and for daily tasks such as reading and driving. In some people, AMD advances so slowly that it has little effect on their vision as they age. In others, the disease progresses quickly and may lead to a loss of vision in one or both eyes. There are two major types of AMD:  Dry AMD. This is the more common type. It leads to the slow breakdown of the light-sensing cells in the macula and a slow loss of central vision.  Wet AMD. As dry AMD worsens, abnormal blood vessels may begin to grow causing wet AMD. These blood vessels often leak blood and fluid under the macula because these vessels are often very fragile. This causes rapid damage to the macula that can lead to the loss of detailed central vision in a short period of time. RISK FACTORS  Older age (65 years of age or older).  Smoking.  Being very overweight (obese).  Having a family history of AMD.  Having high cholesterol, high blood pressure, or other forms of heart disease.  Exposure to high levels of ultraviolet (UV) light and blue light.  Being Caucasian.  Being a woman. SYMPTOMS Dry AMD  Blurred vision, especially when reading print material. This blurred vision will often go away in brighter light.  Small, but growing blurred or blind spot in the center of your field of vision.  Decrease in the intensity of colors. Things may not seem as bright as they once were.  Decreased ability to recognize faces.  One eye may seem to be worse than the other.  Decreased ability to adapt to dimly lit rooms. Wet AMD  All of the above symptoms, and you may notice:  That straight lines appear crooked or wavy.  A small blind spot, which can result in the loss of  central vision. DIAGNOSIS Your caregiver will ask questions regarding recent changes to your central vision and will perform a complete eye exam. Eye drops will be placed in your eyes to enlarge (dilate) your pupils. Dilating the pupils allows your caregiver to view the back of the eye better. You may also be given a fluorescein angiogram test that injects a small amount of dye into a vein. A camera takes pictures of the dye as it travels through the blood vessels of the retina. This test can determine if you have dry or wet AMD. Lastly, you may also be asked to view an Amsler grid, which is a patterned image that looks like a checkerboard. Early changes in your central vision will cause the grid to appear distorted.  TREATMENT There is no cure for AMD. However, the progression of the disease can be slowed. The following may help slow the progression of AMD:  Vitamins C and E, beta carotene, zinc, and antioxidants.  Laser surgery that directs a strong beam of light on the source of leakage or on new blood vessels in the eye to destroy them.  Injections of medicines into the eye. The injection slows down the formation of abnormal blood vessels that might leak. HOME CARE INSTRUCTIONS  Schedule and keep annual eye exams. Your caregiver may recommend more frequent eye exams. Do as directed.  Take dietary supplements as directed by your caregiver.  Ask your caregiver for an Amsler  grid.  Check your vision every day with an Amsler grid to find any vision changes.  Check one eye at a time by covering the eye you are not testing. SEEK MEDICAL CARE IF: You notice any further changes in your vision. Document Released: 10/24/2007 Document Revised: 04/10/2012 Document Reviewed: 01/30/2012 [facility] Patient Information 2015 [place], [place]. This information is not intended to replace advice given to you by your health care provider. Make sure you discuss any questions you have with your health care  provider. Macular Degeneration Macular degeneration (MD) is a common eye disease related to aging. This disease slowly destroys sharp, central vision. Central vision is needed for seeing objects clearly and for daily tasks such as reading and driving. In some people, MD advances so slowly that it has little effect on their vision as they age. In others, the disease progresses quickly and may lead to a loss of vision in one or both eyes. Macular degeneration does not cause total blindness, but can cause a blind spot in the center of vision, which makes it hard to see detail and read. MD happens in two forms. Dry and wet MD. There is no treatment for dry macular degeneration. Certain vitamins, zinc and antioxidants may slow down the progression of the disease. Wet macular degeneration may also be slowed down if the source of leakage is found and treated with a laser. This does not cure the disease itself. Recently, patients have been treated with injections of new drugs. These drugs seem to slow down the progression of MD by slowing down the formation of abnormal blood vessels that might leak. These injections are given into the eyeball and should be done only by a trained retinal specialist. The injections do not cause pain. Document Released: 08/24/2004 Document Revised: 10/09/2011 Document Reviewed: 09/11/2007 [facility] Patient Information 2015 [place], [place]. This information is not intended to replace advice given to you by your health care provider. Make sure you discuss any questions you have with your health care provider.

## 2014-12-08 ENCOUNTER — Other Ambulatory Visit: Payer: Self-pay | Admitting: Family Medicine

## 2014-12-08 LAB — LDL CHOLESTEROL, DIRECT: LDL DIRECT: 118 mg/dL

## 2014-12-08 LAB — COMPREHENSIVE METABOLIC PANEL
ALK PHOS: 48 U/L (ref 39–117)
ALT: 11 U/L (ref 0–35)
AST: 18 U/L (ref 0–37)
Albumin: 4.1 g/dL (ref 3.5–5.2)
BILIRUBIN TOTAL: 0.4 mg/dL (ref 0.2–1.2)
BUN: 23 mg/dL (ref 6–23)
CHLORIDE: 105 meq/L (ref 96–112)
CO2: 28 mEq/L (ref 19–32)
Calcium: 9.9 mg/dL (ref 8.4–10.5)
Creatinine, Ser: 1.9 mg/dL — ABNORMAL HIGH (ref 0.40–1.20)
GFR: 28.04 mL/min — AB (ref >60.00)
Glucose, Bld: 87 mg/dL (ref 70–99)
Potassium: 4.1 mEq/L (ref 3.5–5.1)
Sodium: 140 mEq/L (ref 135–145)
Total Protein: 7.3 g/dL (ref 6.0–8.3)

## 2014-12-08 LAB — CBC
HEMATOCRIT: 37 % (ref 36.0–46.0)
Hemoglobin: 12.6 g/dL (ref 12.0–15.0)
MCHC: 34.1 g/dL (ref 30.0–36.0)
MCV: 87.5 fl (ref 78.0–100.0)
Platelets: 239 10*3/uL (ref 150.0–400.0)
RBC: 4.23 Mil/uL (ref 3.87–5.11)
RDW: 14.4 % (ref 11.5–15.5)
WBC: 7.6 10*3/uL (ref 4.0–10.5)

## 2014-12-08 LAB — LIPID PANEL
CHOL/HDL RATIO: 4
Cholesterol: 198 mg/dL (ref 0–200)
HDL: 45.4 mg/dL (ref >39.00)
NonHDL: 152.6
Triglycerides: 374 mg/dL — ABNORMAL HIGH (ref 0.0–149.0)
VLDL: 74.8 mg/dL — AB (ref 0.0–40.0)

## 2014-12-08 LAB — TSH: TSH: 0.29 u[IU]/mL — ABNORMAL LOW (ref 0.35–4.50)

## 2014-12-14 ENCOUNTER — Ambulatory Visit: Payer: No Typology Code available for payment source | Admitting: Family Medicine

## 2014-12-20 ENCOUNTER — Encounter: Payer: Self-pay | Admitting: Family Medicine

## 2014-12-20 NOTE — Assessment & Plan Note (Signed)
Encouraged heart healthy diet, increase exercise, avoid trans fats, consider a krill oil cap daily 

## 2014-12-20 NOTE — Assessment & Plan Note (Signed)
resolved 

## 2014-12-20 NOTE — Assessment & Plan Note (Signed)
Tolerating Coumadin. Check PT/INR today, asymptomatic

## 2014-12-20 NOTE — Assessment & Plan Note (Signed)
Loosing vision in right eye

## 2014-12-20 NOTE — Progress Notes (Signed)
Emily Mitchell  [id number] 1945/12/27 12/20/2014      Progress Note-Follow Up  Subjective  Chief Complaint  Chief Complaint  Patient presents with  . Follow-up    HPI  Patient is a 68 y.o. female in today for routine medical care. Patient is in today for follow-up. Generally feels well but continues to struggle with ongoing visual loss in her right eye due to a leak, and macular degeneration. She denies any recent illness. No fevers, chills. Reports she's eating well and doing well with activities of daily living. Denies CP/palp/SOB/HA/congestion/fevers/GI or GU c/o. Taking meds as prescribed  Past Medical History  Diagnosis Date  . Carotid artery occlusion   . Hyperlipidemia   . Hypertension   . COPD (chronic obstructive pulmonary disease)   . Chicken pox as a child  . Mumps 40 yrs old  . GERD (gastroesophageal reflux disease)   . Asthma   . Arthritis   . Hypothyroidism   . Chronic kidney disease     Bright's Disease at age 84   . Tremor   . CHF (congestive heart failure)   . Anxiety and depression 12/14/2008    Qualifier: Diagnosis of  By: [name] LPN, Almyra Free    . Pulmonary emboli 05/26/2013  . H. pylori infection 10/19/2013  . UTI (lower urinary tract infection) 10/19/2013  . Anemia 07/05/2014    Past Surgical History  Procedure Laterality Date  . Carotid endarterectomy Right 05/10/07    cea  . Knees replaced  2005 and 2011    both knees  . Wisdom tooth extraction    . Abdominal hysterectomy  1984  . Cataract extraction Left     Family History  Problem Relation Age of Onset  . Stroke Mother     mini stroke  . Kidney disease Mother   . Heart failure Mother   . Hypertension Mother   . Diabetes Sister     type 2  . Hyperlipidemia Sister     History   Social History  . Marital Status: Divorced    Spouse Name: N/A  . Number of Children: 2  . Years of Education: N/A   Occupational History  . Not on file.   Social History Main Topics  . Smoking  status: Former Smoker -- 2.00 packs/day for 30 years    Types: Cigarettes    Quit date: 12/03/1994  . Smokeless tobacco: Never Used  . Alcohol Use: No  . Drug Use: No  . Sexual Activity: No   Other Topics Concern  . Not on file   Social History Narrative    Current Outpatient Prescriptions on File Prior to Visit  Medication Sig Dispense Refill  . acetaminophen (TYLENOL) 500 MG tablet Take 500 mg by mouth every 6 (six) hours as needed.    Marland Kitchen albuterol (PROVENTIL HFA;VENTOLIN HFA) 108 (90 BASE) MCG/ACT inhaler Inhale 2 puffs into the lungs every 4 (four) hours as needed for wheezing.    . calcium citrate-vitamin D (CITRACAL+D) 315-200 MG-UNIT per tablet Take 1 tablet by mouth daily.     . Cholecalciferol (D3 MAXIMUM STRENGTH) 5000 UNITS capsule Take 5,000 Units by mouth daily.    . citalopram (CELEXA) 20 MG tablet Take 1 tablet (20 mg total) by mouth daily. 90 tablet 1  . clonazePAM (KLONOPIN) 1 MG tablet Take 1 tablet (1 mg total) by mouth 3 (three) times daily as needed for anxiety. 90 tablet 2  . Cyanocobalamin (VITAMIN B-12 PO) Take by mouth.    Marland Kitchen  esomeprazole (NEXIUM) 40 MG capsule Take 1 capsule (40 mg total) by mouth daily at 12 noon. 90 capsule 1  . ezetimibe (ZETIA) 10 MG tablet Take 1 tablet (10 mg total) by mouth daily. 90 tablet 1  . Fluticasone-Salmeterol (ADVAIR) 250-50 MCG/DOSE AEPB Inhale 1 puff into the lungs every 12 (twelve) hours. 180 each 3  . levalbuterol (XOPENEX) 1.25 MG/0.5ML nebulizer solution Take 1.25 mg by nebulization every 4 (four) hours as needed for wheezing. 30 each 1  . levothyroxine (SYNTHROID, LEVOTHROID) 137 MCG tablet Take 1 tablet (137 mcg total) by mouth daily before breakfast. 90 tablet 3  . montelukast (SINGULAIR) 10 MG tablet Take 1 tablet (10 mg total) by mouth at bedtime. Does not take Wed & Sun 90 tablet 1  . Multiple Vitamins-Minerals (PRESERVISION AREDS PO) Take 10 g by mouth every morning.    . nitroGLYCERIN (NITROSTAT) 0.4 MG SL tablet  Place 1 tablet (0.4 mg total) under the tongue every 5 (five) minutes as needed for chest pain. 25 tablet 3  . traMADol (ULTRAM) 50 MG tablet Take 1 tablet (50 mg total) by mouth every 8 (eight) hours as needed for moderate pain or severe pain. 90 tablet 0  . warfarin (COUMADIN) 5 MG tablet Take as directed 30 tablet 5   No current facility-administered medications on file prior to visit.    Allergies  Allergen Reactions  . Niaspan [Niacin Er] Nausea And Vomiting and Swelling    Swelling in mouth  . Pantoprazole     Mouth sores  . Bupropion Other (See Comments)    Uncontrollable shakes  . Penicillins Hives  . Statins   . Sulfonamide Derivatives Hives    Review of Systems  Review of Systems  Constitutional: Negative for fever and malaise/fatigue.  HENT: Negative for congestion.   Eyes: Negative for discharge.  Respiratory: Negative for shortness of breath.   Cardiovascular: Negative for chest pain, palpitations and leg swelling.  Gastrointestinal: Negative for nausea, abdominal pain and diarrhea.  Genitourinary: Negative for dysuria.  Musculoskeletal: Negative for falls.  Skin: Negative for rash.  Neurological: Negative for loss of consciousness and headaches.  Endo/Heme/Allergies: Negative for polydipsia.  Psychiatric/Behavioral: Negative for depression and suicidal ideas. The patient is not nervous/anxious and does not have insomnia.     Objective  BP 110/86 mmHg  Pulse 87  Temp(Src) 97.8 F (36.6 C) (Oral)  Ht 5\' 4"  (1.626 m)  Wt 193 lb (87.544 kg)  BMI 33.11 kg/m2  SpO2 96%  Physical Exam  Physical Exam  Constitutional: She is oriented to person, place, and time and well-developed, well-nourished, and in no distress. No distress.  HENT:  Head: Normocephalic and atraumatic.  Eyes: Conjunctivae are normal.  Neck: Neck supple. No thyromegaly present.  Cardiovascular: Normal rate, regular rhythm and normal heart sounds.   Pulmonary/Chest: Effort normal and  breath sounds normal. She has no wheezes.  Abdominal: She exhibits no distension and no mass.  Musculoskeletal: She exhibits no edema.  Lymphadenopathy:    She has no cervical adenopathy.  Neurological: She is alert and oriented to person, place, and time.  Skin: Skin is warm and dry. No rash noted. She is not diaphoretic.  Psychiatric: Memory, affect and judgment normal.    Lab Results  Component Value Date   TSH 0.29* 12/07/2014   Lab Results  Component Value Date   WBC 7.6 12/07/2014   HGB 12.6 12/07/2014   HCT 37.0 12/07/2014   MCV 87.5 12/07/2014   PLT 239.0 12/07/2014  Lab Results  Component Value Date   CREATININE 1.90* 12/07/2014   BUN 23 12/07/2014   NA 140 12/07/2014   K 4.1 12/07/2014   CL 105 12/07/2014   CO2 28 12/07/2014   Lab Results  Component Value Date   ALT 11 12/07/2014   AST 18 12/07/2014   ALKPHOS 48 12/07/2014   BILITOT 0.4 12/07/2014   Lab Results  Component Value Date   CHOL 198 12/07/2014   Lab Results  Component Value Date   HDL 45.40 12/07/2014   Lab Results  Component Value Date   LDLCALC 83 10/16/2013   Lab Results  Component Value Date   TRIG 374.0* 12/07/2014   Lab Results  Component Value Date   CHOLHDL 4 12/07/2014     Assessment & Plan  Anemia resolved   Macular degeneration Loosing vision in right eye   Hyperlipidemia, mixed Encouraged heart healthy diet, increase exercise, avoid trans fats, consider a krill oil cap daily   Pulmonary emboli Tolerating Coumadin. Check PT/INR today, asymptomatic

## 2014-12-24 ENCOUNTER — Ambulatory Visit (INDEPENDENT_AMBULATORY_CARE_PROVIDER_SITE_OTHER): Payer: Medicare Other | Admitting: *Deleted

## 2014-12-24 ENCOUNTER — Ambulatory Visit (INDEPENDENT_AMBULATORY_CARE_PROVIDER_SITE_OTHER): Payer: Medicare Other | Admitting: Pulmonary Disease

## 2014-12-24 ENCOUNTER — Encounter: Payer: Self-pay | Admitting: Pulmonary Disease

## 2014-12-24 VITALS — BP 124/82 | HR 88 | Temp 98.1°F | Resp 16 | Ht 64.0 in | Wt 196.4 lb

## 2014-12-24 VITALS — BP 109/70 | HR 77 | Temp 98.3°F | Ht 64.0 in | Wt 196.0 lb

## 2014-12-24 DIAGNOSIS — J42 Unspecified chronic bronchitis: Secondary | ICD-10-CM | POA: Diagnosis not present

## 2014-12-24 DIAGNOSIS — Z7901 Long term (current) use of anticoagulants: Secondary | ICD-10-CM | POA: Diagnosis not present

## 2014-12-24 DIAGNOSIS — G4733 Obstructive sleep apnea (adult) (pediatric): Secondary | ICD-10-CM

## 2014-12-24 LAB — POCT INR: INR: 2.7

## 2014-12-24 NOTE — Assessment & Plan Note (Signed)
OK to stop taking advair Use albuterol as needed only Check oxygen levels during sleep - we will call you with results Call if symptoms worse

## 2014-12-24 NOTE — Patient Instructions (Signed)
Continue to take 2.5mg  on Monday and Thursday and all other days take 5mg . Recheck in 4 weeks.

## 2014-12-24 NOTE — Progress Notes (Signed)
Pre visit review using our clinic review tool, if applicable. No additional management support is needed unless otherwise documented below in the visit note. 

## 2014-12-24 NOTE — Progress Notes (Signed)
   Subjective:    Patient ID: Emily Mitchell, female    DOB: 12/01/1945, 69 y.o.   MRN: [id number]  HPI  69 y.o for FU of obstructive sleep apnea  Patient is struggling with restless sleep and a h/o sleep apnea, not presently on CPAP    She was diagnosed with PE in 01/2013 at [facility] - on coumadin  Placed on O2 during sleep   Significant tests/ events  PSG at [facility] 08/2011 -217 lbs -AHI 4/h, TST 319 mins,supine - and Dr Jarome Lamas said she did not need CPAP. Dr Luan Pulling said she did  Copd dx 2004. Pt saw [facility] in the past.  Spirometry 05/12/2013: FEV1 72% predicted FEV1 FVC ratio 73   PSG 07/2013 -208 lbs -showed mild OSA TST - 390 mins,  AHI  6/h, RDI of 8/h The lowest desaturation was 87%   Echo 08/2014 grade 2 DD   12/24/2014  Chief Complaint  Patient presents with  . Follow-up    COPD/OSA: patient does not have CPAP, thinks she needs to start using CPAP.  She is currently on Oxygen.  Patient says that she was asleep and the dog jumped up on her and she didn't even feel the dog jump on her, the dog was pawing at her and woke her up.  She is afraid that she stopped breathing and thinks she needs to go on CPAP to help her with breathing.    Wonders if she needs CPAP due to above incident If she did not wake up with such a heavy dog on her, she must have stopped breathing? Not sure that noct  o2 is helping her at all  Review of Systems neg for any significant sore throat, dysphagia, itching, sneezing, nasal congestion or excess/ purulent secretions, fever, chills, sweats, unintended wt loss, pleuritic or exertional cp, hempoptysis, orthopnea pnd or change in chronic leg swelling. Also denies presyncope, palpitations, heartburn, abdominal pain, nausea, vomiting, diarrhea or change in bowel or urinary habits, dysuria,hematuria, rash, arthralgias, visual complaints, headache, numbness weakness or ataxia.     Objective:   Physical Exam  Gen. Pleasant,  well-nourished, in no distress ENT - no lesions, no post nasal drip Neck: No JVD, no thyromegaly, no carotid bruits Lungs: no use of accessory muscles, no dullness to percussion, clear without rales or rhonchi  Cardiovascular: Rhythm regular, heart sounds  normal, no murmurs or gallops, no peripheral edema Musculoskeletal: No deformities, no cyanosis or clubbing         Assessment & Plan:

## 2014-12-24 NOTE — Assessment & Plan Note (Signed)
prob ok to stay off CPAP, given very mild AHI She will call if symptoms worse

## 2014-12-24 NOTE — Patient Instructions (Signed)
OK to stop taking advair Use albuterol as needed only Check oxygen levels during sleep - we will call you with results Call if symptoms worse

## 2015-01-13 ENCOUNTER — Telehealth: Payer: Self-pay | Admitting: Pulmonary Disease

## 2015-01-13 DIAGNOSIS — J449 Chronic obstructive pulmonary disease, unspecified: Secondary | ICD-10-CM

## 2015-01-13 NOTE — Telephone Encounter (Signed)
No sig desat

## 2015-01-14 ENCOUNTER — Ambulatory Visit: Payer: Medicare Other | Admitting: Neurology

## 2015-01-14 NOTE — Telephone Encounter (Signed)
Patient notified. [place] for patient to stop oxygen at night? Please advise

## 2015-01-15 NOTE — Telephone Encounter (Signed)
Ok to dc O2 

## 2015-01-15 NOTE — Telephone Encounter (Signed)
Order to D/C oxygen has been entered. Pt notified.  Nothing further needed.

## 2015-01-25 ENCOUNTER — Encounter: Payer: Self-pay | Admitting: Family Medicine

## 2015-01-28 ENCOUNTER — Ambulatory Visit (INDEPENDENT_AMBULATORY_CARE_PROVIDER_SITE_OTHER): Payer: Medicare Other

## 2015-01-28 DIAGNOSIS — Z7901 Long term (current) use of anticoagulants: Secondary | ICD-10-CM | POA: Diagnosis not present

## 2015-01-28 DIAGNOSIS — Z86711 Personal history of pulmonary embolism: Secondary | ICD-10-CM

## 2015-01-28 LAB — POCT INR: INR: 4

## 2015-01-28 NOTE — Progress Notes (Signed)
Pre visit review using our clinic review tool, if applicable. No additional management support is needed unless otherwise documented below in the visit note. 

## 2015-01-28 NOTE — Patient Instructions (Signed)
Hold dose today (01/28/15) and then take 1 tablet every day except take 1/2 tablet on Mon, Wed, and Sat.  Recheck in 1 week.

## 2015-02-03 ENCOUNTER — Encounter: Payer: Self-pay | Admitting: Pulmonary Disease

## 2015-02-04 ENCOUNTER — Ambulatory Visit (INDEPENDENT_AMBULATORY_CARE_PROVIDER_SITE_OTHER): Payer: Medicare Other | Admitting: *Deleted

## 2015-02-04 DIAGNOSIS — I2699 Other pulmonary embolism without acute cor pulmonale: Secondary | ICD-10-CM

## 2015-02-04 LAB — POCT INR: INR: 2.4

## 2015-02-04 NOTE — Progress Notes (Signed)
Pre visit review using our clinic review tool, if applicable. No additional management support is needed unless otherwise documented below in the visit note. 

## 2015-02-04 NOTE — Patient Instructions (Signed)
Orders per Dr. Charlett Blake: Continue to take 1 tablet every day except take 1/2 tablet on Monday, Thursday, and Saturday.  Recheck in 2 weeks.

## 2015-02-08 ENCOUNTER — Telehealth: Payer: Self-pay | Admitting: *Deleted

## 2015-02-08 ENCOUNTER — Encounter: Payer: Self-pay | Admitting: *Deleted

## 2015-02-08 NOTE — Telephone Encounter (Signed)
Pre-Visit Call completed with patient and chart updated.   Pre-Visit Info documented in Specialty Comments under [employer].    

## 2015-02-09 ENCOUNTER — Ambulatory Visit (INDEPENDENT_AMBULATORY_CARE_PROVIDER_SITE_OTHER): Payer: Medicare Other | Admitting: Family Medicine

## 2015-02-09 ENCOUNTER — Encounter: Payer: Self-pay | Admitting: Family Medicine

## 2015-02-09 VITALS — BP 122/72 | HR 84 | Temp 99.3°F | Ht 64.0 in | Wt 194.1 lb

## 2015-02-09 DIAGNOSIS — I2699 Other pulmonary embolism without acute cor pulmonale: Secondary | ICD-10-CM

## 2015-02-09 DIAGNOSIS — G4733 Obstructive sleep apnea (adult) (pediatric): Secondary | ICD-10-CM

## 2015-02-09 DIAGNOSIS — E039 Hypothyroidism, unspecified: Secondary | ICD-10-CM

## 2015-02-09 DIAGNOSIS — E2839 Other primary ovarian failure: Secondary | ICD-10-CM

## 2015-02-09 DIAGNOSIS — G252 Other specified forms of tremor: Secondary | ICD-10-CM

## 2015-02-09 DIAGNOSIS — E782 Mixed hyperlipidemia: Secondary | ICD-10-CM

## 2015-02-09 DIAGNOSIS — M25561 Pain in right knee: Secondary | ICD-10-CM

## 2015-02-09 DIAGNOSIS — Z0000 Encounter for general adult medical examination without abnormal findings: Secondary | ICD-10-CM | POA: Diagnosis not present

## 2015-02-09 DIAGNOSIS — Z780 Asymptomatic menopausal state: Secondary | ICD-10-CM

## 2015-02-09 DIAGNOSIS — M25562 Pain in left knee: Secondary | ICD-10-CM

## 2015-02-09 DIAGNOSIS — G250 Essential tremor: Secondary | ICD-10-CM

## 2015-02-09 DIAGNOSIS — N189 Chronic kidney disease, unspecified: Secondary | ICD-10-CM

## 2015-02-09 DIAGNOSIS — I509 Heart failure, unspecified: Secondary | ICD-10-CM

## 2015-02-09 DIAGNOSIS — M1990 Unspecified osteoarthritis, unspecified site: Secondary | ICD-10-CM

## 2015-02-09 NOTE — Patient Instructions (Signed)
Preventive Care for Adults A healthy lifestyle and preventive care can promote health and wellness. Preventive health guidelines for women include the following key practices.  A routine yearly physical is a good way to check with your health care provider about your health and preventive screening. It is a chance to share any concerns and updates on your health and to receive a thorough exam.  Visit your dentist for a routine exam and preventive care every 6 months. Brush your teeth twice a day and floss once a day. Good oral hygiene prevents tooth decay and gum disease.  The frequency of eye exams is based on your age, health, family medical history, use of contact lenses, and other factors. Follow your health care provider's recommendations for frequency of eye exams.  Eat a healthy diet. Foods like vegetables, fruits, whole grains, low-fat dairy products, and lean protein foods contain the nutrients you need without too many calories. Decrease your intake of foods high in solid fats, added sugars, and salt. Eat the right amount of calories for you.Get information about a proper diet from your health care provider, if necessary.  Regular physical exercise is one of the most important things you can do for your health. Most adults should get at least 150 minutes of moderate-intensity exercise (any activity that increases your heart rate and causes you to sweat) each week. In addition, most adults need muscle-strengthening exercises on 2 or more days a week.  Maintain a healthy weight. The body mass index (BMI) is a screening tool to identify possible weight problems. It provides an estimate of body fat based on height and weight. Your health care provider can find your BMI and can help you achieve or maintain a healthy weight.For adults 20 years and older:  A BMI below 18.5 is considered underweight.  A BMI of 18.5 to 24.9 is normal.  A BMI of 25 to 29.9 is considered overweight.  A BMI of  30 and above is considered obese.  Maintain normal blood lipids and cholesterol levels by exercising and minimizing your intake of saturated fat. Eat a balanced diet with plenty of fruit and vegetables. Blood tests for lipids and cholesterol should begin at age 76 and be repeated every 5 years. If your lipid or cholesterol levels are high, you are over 50, or you are at high risk for heart disease, you may need your cholesterol levels checked more frequently.Ongoing high lipid and cholesterol levels should be treated with medicines if diet and exercise are not working.  If you smoke, find out from your health care provider how to quit. If you do not use tobacco, do not start.  Lung cancer screening is recommended for adults aged 22-80 years who are at high risk for developing lung cancer because of a history of smoking. A yearly low-dose CT scan of the lungs is recommended for people who have at least a 30-pack-year history of smoking and are a current smoker or have quit within the past 15 years. A pack year of smoking is smoking an average of 1 pack of cigarettes a day for 1 year (for example: 1 pack a day for 30 years or 2 packs a day for 15 years). Yearly screening should continue until the smoker has stopped smoking for at least 15 years. Yearly screening should be stopped for people who develop a health problem that would prevent them from having lung cancer treatment.  If you are pregnant, do not drink alcohol. If you are breastfeeding,  be very cautious about drinking alcohol. If you are not pregnant and choose to drink alcohol, do not have more than 1 drink per day. One drink is considered to be 12 ounces (355 mL) of beer, 5 ounces (148 mL) of wine, or 1.5 ounces (44 mL) of liquor.  Avoid use of street drugs. Do not share needles with anyone. Ask for help if you need support or instructions about stopping the use of drugs.  High blood pressure causes heart disease and increases the risk of  stroke. Your blood pressure should be checked at least every 1 to 2 years. Ongoing high blood pressure should be treated with medicines if weight loss and exercise do not work.  If you are 75-52 years old, ask your health care provider if you should take aspirin to prevent strokes.  Diabetes screening involves taking a blood sample to check your fasting blood sugar level. This should be done once every 3 years, after age 15, if you are within normal weight and without risk factors for diabetes. Testing should be considered at a younger age or be carried out more frequently if you are overweight and have at least 1 risk factor for diabetes.  Breast cancer screening is essential preventive care for women. You should practice "breast self-awareness." This means understanding the normal appearance and feel of your breasts and may include breast self-examination. Any changes detected, no matter how small, should be reported to a health care provider. Women in their 58s and 30s should have a clinical breast exam (CBE) by a health care provider as part of a regular health exam every 1 to 3 years. After age 16, women should have a CBE every year. Starting at age 53, women should consider having a mammogram (breast X-ray test) every year. Women who have a family history of breast cancer should talk to their health care provider about genetic screening. Women at a high risk of breast cancer should talk to their health care providers about having an MRI and a mammogram every year.  Breast cancer gene (BRCA)-related cancer risk assessment is recommended for women who have family members with BRCA-related cancers. BRCA-related cancers include breast, ovarian, tubal, and peritoneal cancers. Having family members with these cancers may be associated with an increased risk for harmful changes (mutations) in the breast cancer genes BRCA1 and BRCA2. Results of the assessment will determine the need for genetic counseling and  BRCA1 and BRCA2 testing.  Routine pelvic exams to screen for cancer are no longer recommended for nonpregnant women who are considered low risk for cancer of the pelvic organs (ovaries, uterus, and vagina) and who do not have symptoms. Ask your health care provider if a screening pelvic exam is right for you.  If you have had past treatment for cervical cancer or a condition that could lead to cancer, you need Pap tests and screening for cancer for at least 20 years after your treatment. If Pap tests have been discontinued, your risk factors (such as having a new sexual partner) need to be reassessed to determine if screening should be resumed. Some women have medical problems that increase the chance of getting cervical cancer. In these cases, your health care provider may recommend more frequent screening and Pap tests.  The HPV test is an additional test that may be used for cervical cancer screening. The HPV test looks for the virus that can cause the cell changes on the cervix. The cells collected during the Pap test can be  tested for HPV. The HPV test could be used to screen women aged 30 years and older, and should be used in women of any age who have unclear Pap test results. After the age of 30, women should have HPV testing at the same frequency as a Pap test.  Colorectal cancer can be detected and often prevented. Most routine colorectal cancer screening begins at the age of 50 years and continues through age 75 years. However, your health care provider may recommend screening at an earlier age if you have risk factors for colon cancer. On a yearly basis, your health care provider may provide home test kits to check for hidden blood in the stool. Use of a small camera at the end of a tube, to directly examine the colon (sigmoidoscopy or colonoscopy), can detect the earliest forms of colorectal cancer. Talk to your health care provider about this at age 50, when routine screening begins. Direct  exam of the colon should be repeated every 5-10 years through age 75 years, unless early forms of pre-cancerous polyps or small growths are found.  People who are at an increased risk for hepatitis B should be screened for this virus. You are considered at high risk for hepatitis B if:  You were born in a country where hepatitis B occurs often. Talk with your health care provider about which countries are considered high risk.  Your parents were born in a high-risk country and you have not received a shot to protect against hepatitis B (hepatitis B vaccine).  You have HIV or AIDS.  You use needles to inject street drugs.  You live with, or have sex with, someone who has hepatitis B.  You get hemodialysis treatment.  You take certain medicines for conditions like cancer, organ transplantation, and autoimmune conditions.  Hepatitis C blood testing is recommended for all people born from 1945 through 1965 and any individual with known risks for hepatitis C.  Practice safe sex. Use condoms and avoid high-risk sexual practices to reduce the spread of sexually transmitted infections (STIs). STIs include gonorrhea, chlamydia, syphilis, trichomonas, herpes, HPV, and human immunodeficiency virus (HIV). Herpes, HIV, and HPV are viral illnesses that have no cure. They can result in disability, cancer, and death.  You should be screened for sexually transmitted illnesses (STIs) including gonorrhea and chlamydia if:  You are sexually active and are younger than 24 years.  You are older than 24 years and your health care provider tells you that you are at risk for this type of infection.  Your sexual activity has changed since you were last screened and you are at an increased risk for chlamydia or gonorrhea. Ask your health care provider if you are at risk.  If you are at risk of being infected with HIV, it is recommended that you take a prescription medicine daily to prevent HIV infection. This is  called preexposure prophylaxis (PrEP). You are considered at risk if:  You are a heterosexual woman, are sexually active, and are at increased risk for HIV infection.  You take drugs by injection.  You are sexually active with a partner who has HIV.  Talk with your health care provider about whether you are at high risk of being infected with HIV. If you choose to begin PrEP, you should first be tested for HIV. You should then be tested every 3 months for as long as you are taking PrEP.  Osteoporosis is a disease in which the bones lose minerals and strength   with aging. This can result in serious bone fractures or breaks. The risk of osteoporosis can be identified using a bone density scan. Women ages 65 years and over and women at risk for fractures or osteoporosis should discuss screening with their health care providers. Ask your health care provider whether you should take a calcium supplement or vitamin D to reduce the rate of osteoporosis.  Menopause can be associated with physical symptoms and risks. Hormone replacement therapy is available to decrease symptoms and risks. You should talk to your health care provider about whether hormone replacement therapy is right for you.  Use sunscreen. Apply sunscreen liberally and repeatedly throughout the day. You should seek shade when your shadow is shorter than you. Protect yourself by wearing long sleeves, pants, a wide-brimmed hat, and sunglasses year round, whenever you are outdoors.  Once a month, do a whole body skin exam, using a mirror to look at the skin on your back. Tell your health care provider of new moles, moles that have irregular borders, moles that are larger than a pencil eraser, or moles that have changed in shape or color.  Stay current with required vaccines (immunizations).  Influenza vaccine. All adults should be immunized every year.  Tetanus, diphtheria, and acellular pertussis (Td, Tdap) vaccine. Pregnant women should  receive 1 dose of Tdap vaccine during each pregnancy. The dose should be obtained regardless of the length of time since the last dose. Immunization is preferred during the 27th-36th week of gestation. An adult who has not previously received Tdap or who does not know her vaccine status should receive 1 dose of Tdap. This initial dose should be followed by tetanus and diphtheria toxoids (Td) booster doses every 10 years. Adults with an unknown or incomplete history of completing a 3-dose immunization series with Td-containing vaccines should begin or complete a primary immunization series including a Tdap dose. Adults should receive a Td booster every 10 years.  Varicella vaccine. An adult without evidence of immunity to varicella should receive 2 doses or a second dose if she has previously received 1 dose. Pregnant females who do not have evidence of immunity should receive the first dose after pregnancy. This first dose should be obtained before leaving the health care facility. The second dose should be obtained 4-8 weeks after the first dose.  Human papillomavirus (HPV) vaccine. Females aged 13-26 years who have not received the vaccine previously should obtain the 3-dose series. The vaccine is not recommended for use in pregnant females. However, pregnancy testing is not needed before receiving a dose. If a female is found to be pregnant after receiving a dose, no treatment is needed. In that case, the remaining doses should be delayed until after the pregnancy. Immunization is recommended for any person with an immunocompromised condition through the age of 26 years if she did not get any or all doses earlier. During the 3-dose series, the second dose should be obtained 4-8 weeks after the first dose. The third dose should be obtained 24 weeks after the first dose and 16 weeks after the second dose.  Zoster vaccine. One dose is recommended for adults aged 60 years or older unless certain conditions are  present.  Measles, mumps, and rubella (MMR) vaccine. Adults born before 1957 generally are considered immune to measles and mumps. Adults born in 1957 or later should have 1 or more doses of MMR vaccine unless there is a contraindication to the vaccine or there is laboratory evidence of immunity to   each of the three diseases. A routine second dose of MMR vaccine should be obtained at least 28 days after the first dose for students attending postsecondary schools, health care workers, or international travelers. People who received inactivated measles vaccine or an unknown type of measles vaccine during 1963-1967 should receive 2 doses of MMR vaccine. People who received inactivated mumps vaccine or an unknown type of mumps vaccine before 1979 and are at high risk for mumps infection should consider immunization with 2 doses of MMR vaccine. For females of childbearing age, rubella immunity should be determined. If there is no evidence of immunity, females who are not pregnant should be vaccinated. If there is no evidence of immunity, females who are pregnant should delay immunization until after pregnancy. Unvaccinated health care workers born before 1957 who lack laboratory evidence of measles, mumps, or rubella immunity or laboratory confirmation of disease should consider measles and mumps immunization with 2 doses of MMR vaccine or rubella immunization with 1 dose of MMR vaccine.  Pneumococcal 13-valent conjugate (PCV13) vaccine. When indicated, a person who is uncertain of her immunization history and has no record of immunization should receive the PCV13 vaccine. An adult aged 19 years or older who has certain medical conditions and has not been previously immunized should receive 1 dose of PCV13 vaccine. This PCV13 should be followed with a dose of pneumococcal polysaccharide (PPSV23) vaccine. The PPSV23 vaccine dose should be obtained at least 8 weeks after the dose of PCV13 vaccine. An adult aged 19  years or older who has certain medical conditions and previously received 1 or more doses of PPSV23 vaccine should receive 1 dose of PCV13. The PCV13 vaccine dose should be obtained 1 or more years after the last PPSV23 vaccine dose.  Pneumococcal polysaccharide (PPSV23) vaccine. When PCV13 is also indicated, PCV13 should be obtained first. All adults aged 65 years and older should be immunized. An adult younger than age 65 years who has certain medical conditions should be immunized. Any person who resides in a nursing home or long-term care facility should be immunized. An adult smoker should be immunized. People with an immunocompromised condition and certain other conditions should receive both PCV13 and PPSV23 vaccines. People with human immunodeficiency virus (HIV) infection should be immunized as soon as possible after diagnosis. Immunization during chemotherapy or radiation therapy should be avoided. Routine use of PPSV23 vaccine is not recommended for American Indians, [place] Natives, or people younger than 65 years unless there are medical conditions that require PPSV23 vaccine. When indicated, people who have unknown immunization and have no record of immunization should receive PPSV23 vaccine. One-time revaccination 5 years after the first dose of PPSV23 is recommended for people aged 19-64 years who have chronic kidney failure, nephrotic syndrome, asplenia, or immunocompromised conditions. People who received 1-2 doses of PPSV23 before age 65 years should receive another dose of PPSV23 vaccine at age 65 years or later if at least 5 years have passed since the previous dose. Doses of PPSV23 are not needed for people immunized with PPSV23 at or after age 65 years.  Meningococcal vaccine. Adults with asplenia or persistent complement component deficiencies should receive 2 doses of quadrivalent meningococcal conjugate (MenACWY-D) vaccine. The doses should be obtained at least 2 months apart.  Microbiologists working with certain meningococcal bacteria, military recruits, people at risk during an outbreak, and people who travel to or live in countries with a high rate of meningitis should be immunized. A first-year college student up through age   21 years who is living in a residence hall should receive a dose if she did not receive a dose on or after her 16th birthday. Adults who have certain high-risk conditions should receive one or more doses of vaccine.  Hepatitis A vaccine. Adults who wish to be protected from this disease, have certain high-risk conditions, work with hepatitis A-infected animals, work in hepatitis A research labs, or travel to or work in countries with a high rate of hepatitis A should be immunized. Adults who were previously unvaccinated and who anticipate close contact with an international adoptee during the first 60 days after arrival in the [place] from a country with a high rate of hepatitis A should be immunized.  Hepatitis B vaccine. Adults who wish to be protected from this disease, have certain high-risk conditions, may be exposed to blood or other infectious body fluids, are household contacts or sex partners of hepatitis B positive people, are clients or workers in certain care facilities, or travel to or work in countries with a high rate of hepatitis B should be immunized.  Haemophilus influenzae type b (Hib) vaccine. A previously unvaccinated person with asplenia or sickle cell disease or having a scheduled splenectomy should receive 1 dose of Hib vaccine. Regardless of previous immunization, a recipient of a hematopoietic stem cell transplant should receive a 3-dose series 6-12 months after her successful transplant. Hib vaccine is not recommended for adults with HIV infection. Preventive Services / Frequency Ages 64 to 68 years  Blood pressure check.** / Every 1 to 2 years.  Lipid and cholesterol check.** / Every 5 years beginning at age  22.  Clinical breast exam.** / Every 3 years for women in their 88s and 53s.  BRCA-related cancer risk assessment.** / For women who have family members with a BRCA-related cancer (breast, ovarian, tubal, or peritoneal cancers).  Pap test.** / Every 2 years from ages 90 through 51. Every 3 years starting at age 21 through age 56 or 3 with a history of 3 consecutive normal Pap tests.  HPV screening.** / Every 3 years from ages 24 through ages 1 to 46 with a history of 3 consecutive normal Pap tests.  Hepatitis C blood test.** / For any individual with known risks for hepatitis C.  Skin self-exam. / Monthly.  Influenza vaccine. / Every year.  Tetanus, diphtheria, and acellular pertussis (Tdap, Td) vaccine.** / Consult your health care provider. Pregnant women should receive 1 dose of Tdap vaccine during each pregnancy. 1 dose of Td every 10 years.  Varicella vaccine.** / Consult your health care provider. Pregnant females who do not have evidence of immunity should receive the first dose after pregnancy.  HPV vaccine. / 3 doses over 6 months, if 72 and younger. The vaccine is not recommended for use in pregnant females. However, pregnancy testing is not needed before receiving a dose.  Measles, mumps, rubella (MMR) vaccine.** / You need at least 1 dose of MMR if you were born in 1957 or later. You may also need a 2nd dose. For females of childbearing age, rubella immunity should be determined. If there is no evidence of immunity, females who are not pregnant should be vaccinated. If there is no evidence of immunity, females who are pregnant should delay immunization until after pregnancy.  Pneumococcal 13-valent conjugate (PCV13) vaccine.** / Consult your health care provider.  Pneumococcal polysaccharide (PPSV23) vaccine.** / 1 to 2 doses if you smoke cigarettes or if you have certain conditions.  Meningococcal vaccine.** /  1 dose if you are age 19 to 21 years and a first-year college  student living in a residence hall, or have one of several medical conditions, you need to get vaccinated against meningococcal disease. You may also need additional booster doses.  Hepatitis A vaccine.** / Consult your health care provider.  Hepatitis B vaccine.** / Consult your health care provider.  Haemophilus influenzae type b (Hib) vaccine.** / Consult your health care provider. Ages 40 to 64 years  Blood pressure check.** / Every 1 to 2 years.  Lipid and cholesterol check.** / Every 5 years beginning at age 20 years.  Lung cancer screening. / Every year if you are aged 55-80 years and have a 30-pack-year history of smoking and currently smoke or have quit within the past 15 years. Yearly screening is stopped once you have quit smoking for at least 15 years or develop a health problem that would prevent you from having lung cancer treatment.  Clinical breast exam.** / Every year after age 40 years.  BRCA-related cancer risk assessment.** / For women who have family members with a BRCA-related cancer (breast, ovarian, tubal, or peritoneal cancers).  Mammogram.** / Every year beginning at age 40 years and continuing for as long as you are in good health. Consult with your health care provider.  Pap test.** / Every 3 years starting at age 30 years through age 65 or 70 years with a history of 3 consecutive normal Pap tests.  HPV screening.** / Every 3 years from ages 30 years through ages 65 to 70 years with a history of 3 consecutive normal Pap tests.  Fecal occult blood test (FOBT) of stool. / Every year beginning at age 50 years and continuing until age 75 years. You may not need to do this test if you get a colonoscopy every 10 years.  Flexible sigmoidoscopy or colonoscopy.** / Every 5 years for a flexible sigmoidoscopy or every 10 years for a colonoscopy beginning at age 50 years and continuing until age 75 years.  Hepatitis C blood test.** / For all people born from 1945 through  1965 and any individual with known risks for hepatitis C.  Skin self-exam. / Monthly.  Influenza vaccine. / Every year.  Tetanus, diphtheria, and acellular pertussis (Tdap/Td) vaccine.** / Consult your health care provider. Pregnant women should receive 1 dose of Tdap vaccine during each pregnancy. 1 dose of Td every 10 years.  Varicella vaccine.** / Consult your health care provider. Pregnant females who do not have evidence of immunity should receive the first dose after pregnancy.  Zoster vaccine.** / 1 dose for adults aged 60 years or older.  Measles, mumps, rubella (MMR) vaccine.** / You need at least 1 dose of MMR if you were born in 1957 or later. You may also need a 2nd dose. For females of childbearing age, rubella immunity should be determined. If there is no evidence of immunity, females who are not pregnant should be vaccinated. If there is no evidence of immunity, females who are pregnant should delay immunization until after pregnancy.  Pneumococcal 13-valent conjugate (PCV13) vaccine.** / Consult your health care provider.  Pneumococcal polysaccharide (PPSV23) vaccine.** / 1 to 2 doses if you smoke cigarettes or if you have certain conditions.  Meningococcal vaccine.** / Consult your health care provider.  Hepatitis A vaccine.** / Consult your health care provider.  Hepatitis B vaccine.** / Consult your health care provider.  Haemophilus influenzae type b (Hib) vaccine.** / Consult your health care provider. Ages 65   years and over  Blood pressure check.** / Every 1 to 2 years.  Lipid and cholesterol check.** / Every 5 years beginning at age 22 years.  Lung cancer screening. / Every year if you are aged 73-80 years and have a 30-pack-year history of smoking and currently smoke or have quit within the past 15 years. Yearly screening is stopped once you have quit smoking for at least 15 years or develop a health problem that would prevent you from having lung cancer  treatment.  Clinical breast exam.** / Every year after age 4 years.  BRCA-related cancer risk assessment.** / For women who have family members with a BRCA-related cancer (breast, ovarian, tubal, or peritoneal cancers).  Mammogram.** / Every year beginning at age 40 years and continuing for as long as you are in good health. Consult with your health care provider.  Pap test.** / Every 3 years starting at age 9 years through age 34 or 91 years with 3 consecutive normal Pap tests. Testing can be stopped between 65 and 70 years with 3 consecutive normal Pap tests and no abnormal Pap or HPV tests in the past 10 years.  HPV screening.** / Every 3 years from ages 57 years through ages 64 or 45 years with a history of 3 consecutive normal Pap tests. Testing can be stopped between 65 and 70 years with 3 consecutive normal Pap tests and no abnormal Pap or HPV tests in the past 10 years.  Fecal occult blood test (FOBT) of stool. / Every year beginning at age 15 years and continuing until age 17 years. You may not need to do this test if you get a colonoscopy every 10 years.  Flexible sigmoidoscopy or colonoscopy.** / Every 5 years for a flexible sigmoidoscopy or every 10 years for a colonoscopy beginning at age 86 years and continuing until age 71 years.  Hepatitis C blood test.** / For all people born from [birth year] through 1963 and any individual with known risks for hepatitis C.  Osteoporosis screening.** / A one-time screening for women ages 83 years and over and women at risk for fractures or osteoporosis.  Skin self-exam. / Monthly.  Influenza vaccine. / Every year.  Tetanus, diphtheria, and acellular pertussis (Tdap/Td) vaccine.** / 1 dose of Td every 10 years.  Varicella vaccine.** / Consult your health care provider.  Zoster vaccine.** / 1 dose for adults aged 61 years or older.  Pneumococcal 13-valent conjugate (PCV13) vaccine.** / Consult your health care provider.  Pneumococcal  polysaccharide (PPSV23) vaccine.** / 1 dose for all adults aged 28 years and older.  Meningococcal vaccine.** / Consult your health care provider.  Hepatitis A vaccine.** / Consult your health care provider.  Hepatitis B vaccine.** / Consult your health care provider.  Haemophilus influenzae type b (Hib) vaccine.** / Consult your health care provider. ** Family history and personal history of risk and conditions may change your health care provider's recommendations. Document Released: 09/12/2001 Document Revised: 12/01/2013 Document Reviewed: 12/12/2010 [facility] Patient Information 2015 [place], [place]. This information is not intended to replace advice given to you by your health care provider. Make sure you discuss any questions you have with your health care provider.

## 2015-02-09 NOTE — Progress Notes (Signed)
Pre visit review using our clinic review tool, if applicable. No additional management support is needed unless otherwise documented below in the visit note. 

## 2015-02-09 NOTE — Progress Notes (Signed)
Emily Mitchell  [id number] [birth year] 02/09/2015      Progress Note-Follow Up  Subjective  Chief Complaint  Chief Complaint  Patient presents with  . Medicare Wellness    HPI  Patient is a 69 y.o. female in today for routine medical care. She is in today for annual wellness exam and follow up. She is doing well. Continues to use Tramadol prn for her knee and back pain and does get some relief when needed. No recent fall or injury, no redness or warmth in the knees. Has been struggling with decreased visual acuity in the right eye and  Is following with [facility]. No recent illness. Last MGM was 01/12/2015 at [facility]. WNL. Denies CP/palp/SOB/HA/congestion/fevers/GI or GU c/o. Taking meds as prescribed  Past Medical History  Diagnosis Date  . Carotid artery occlusion   . Hyperlipidemia   . Hypertension   . COPD (chronic obstructive pulmonary disease)   . Chicken pox as a child  . Mumps 42 yrs old  . GERD (gastroesophageal reflux disease)   . Asthma   . Arthritis   . Hypothyroidism   . 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 Tremor   . CHF (congestive heart failure)   . Anxiety and depression 12/14/2008    Qualifier: Diagnosis of  By: [name] LPN, Almyra Free    . Pulmonary emboli 05/26/2013  . H. pylori infection 10/19/2013  . UTI (lower urinary tract infection) 10/19/2013  . Anemia 07/05/2014    Past Surgical History  Procedure Laterality Date  . Carotid endarterectomy Right 05/10/07    cea  . Knees replaced  2005 and 2011    both knees  . Wisdom tooth extraction    . Abdominal hysterectomy  1984  . Cataract extraction Left     Family History  Problem Relation Age of Onset  . Stroke Mother     mini stroke  . Kidney disease Mother   . Heart failure Mother   . Hypertension Mother   . Diabetes Sister     type 2  . Hyperlipidemia Sister     History   Social History  . Marital Status: Divorced    Spouse Name: N/A  . Number  of Children: 2  . Years of Education: N/A   Occupational History  . Not on file.   Social History Main Topics  . Smoking status: Former Smoker -- 2.00 packs/day for 30 years    Types: Cigarettes    Quit date: 12/03/1994  . Smokeless tobacco: Never Used  . Alcohol Use: No  . Drug Use: No  . Sexual Activity: No   Other Topics Concern  . Not on file   Social History Narrative    Current Outpatient Prescriptions on File Prior to Visit  Medication Sig Dispense Refill  . acetaminophen (TYLENOL) 500 MG tablet Take 500 mg by mouth every 6 (six) hours as needed.    Marland Kitchen albuterol (PROVENTIL HFA;VENTOLIN HFA) 108 (90 BASE) MCG/ACT inhaler Inhale 2 puffs into the lungs every 4 (four) hours as needed for wheezing.    . calcium citrate-vitamin D (CITRACAL+D) 315-200 MG-UNIT per tablet Take 1 tablet by mouth daily.     . Cholecalciferol (D3 MAXIMUM STRENGTH) 5000 UNITS capsule Take 5,000 Units by mouth daily.    . Choline Fenofibrate (TRILIPIX) 135 MG capsule Take 1 capsule (135 mg total) by mouth daily. 90 capsule 1  . citalopram (CELEXA) 20 MG tablet Take 1  tablet (20 mg total) by mouth daily. 90 tablet 1  . clonazePAM (KLONOPIN) 1 MG tablet Take 1 tablet (1 mg total) by mouth 3 (three) times daily as needed for anxiety. 90 tablet 2  . Cyanocobalamin (VITAMIN B-12 PO) Take by mouth.    . esomeprazole (NEXIUM) 40 MG capsule Take 1 capsule (40 mg total) by mouth daily at 12 noon. 90 capsule 1  . ezetimibe (ZETIA) 10 MG tablet Take 1 tablet (10 mg total) by mouth daily. 90 tablet 1  . furosemide (LASIX) 20 MG tablet Take 1 tablet (20 mg total) by mouth daily as needed for fluid or edema (weight gain >3# in 24, sob). 30 tablet 3  . levalbuterol (XOPENEX) 1.25 MG/0.5ML nebulizer solution Take 1.25 mg by nebulization every 4 (four) hours as needed for wheezing. 30 each 1  . levothyroxine (SYNTHROID, LEVOTHROID) 137 MCG tablet Take 1 tablet (137 mcg total) by mouth daily before breakfast. 90 tablet 3    . montelukast (SINGULAIR) 10 MG tablet Take 1 tablet (10 mg total) by mouth at bedtime. Does not take Wed & Sun 90 tablet 1  . Multiple Vitamins-Minerals (PRESERVISION AREDS PO) Take 10 g by mouth every morning.    . nitroGLYCERIN (NITROSTAT) 0.4 MG SL tablet Place 1 tablet (0.4 mg total) under the tongue every 5 (five) minutes as needed for chest pain. 25 tablet 3  . ranitidine (ZANTAC) 300 MG tablet TAKE ONE TABLET BY MOUTH ONCE DAILY AT BEDTIME 30 tablet 6  . traMADol (ULTRAM) 50 MG tablet Take 1 tablet (50 mg total) by mouth every 8 (eight) hours as needed for moderate pain or severe pain. 90 tablet 0  . warfarin (COUMADIN) 5 MG tablet Take as directed 30 tablet 5   No current facility-administered medications on file prior to visit.    Allergies  Allergen Reactions  . Niaspan [Niacin Er] Nausea And Vomiting and Swelling    Swelling in mouth  . Pantoprazole     Mouth sores  . Bupropion Other (See Comments)    Uncontrollable shakes  . Penicillins Hives  . Statins   . Sulfonamide Derivatives Hives    Review of Systems  Review of Systems  Constitutional: Negative for fever, chills and malaise/fatigue.  HENT: Negative for congestion, hearing loss and nosebleeds.   Eyes: Negative for discharge.  Respiratory: Negative for cough, sputum production, shortness of breath and wheezing.   Cardiovascular: Negative for chest pain, palpitations and leg swelling.  Gastrointestinal: Negative for heartburn, nausea, vomiting, abdominal pain, diarrhea, constipation and blood in stool.  Genitourinary: Negative for dysuria, urgency, frequency and hematuria.  Musculoskeletal: Negative for myalgias, back pain and falls.  Skin: Negative for rash.  Neurological: Negative for dizziness, tremors, sensory change, focal weakness, loss of consciousness, weakness and headaches.  Endo/Heme/Allergies: Negative for polydipsia. Does not bruise/bleed easily.  Psychiatric/Behavioral: Negative for depression and  suicidal ideas. The patient is not nervous/anxious and does not have insomnia.     Objective  BP 122/72 mmHg  Pulse 84  Temp(Src) 99.3 F (37.4 C) (Oral)  Ht 5\' 4"  (1.626 m)  Wt 194 lb 2 oz (88.055 kg)  BMI 33.31 kg/m2  SpO2 95%  Physical Exam  Physical Exam  Constitutional: She is oriented to person, place, and time and well-developed, well-nourished, and in no distress. No distress.  HENT:  Head: Normocephalic and atraumatic.  Right Ear: External ear normal.  Left Ear: External ear normal.  Nose: Nose normal.  Mouth/Throat: Oropharynx is clear and moist.  No oropharyngeal exudate.  Eyes: Conjunctivae are normal. Pupils are equal, round, and reactive to light. Right eye exhibits no discharge. Left eye exhibits no discharge. No scleral icterus.  Neck: Normal range of motion. Neck supple. No thyromegaly present.  Cardiovascular: Normal rate, regular rhythm, normal heart sounds and intact distal pulses.   No murmur heard. Pulmonary/Chest: Effort normal and breath sounds normal. No respiratory distress. She has no wheezes. She has no rales.  Abdominal: Soft. Bowel sounds are normal. She exhibits no distension and no mass. There is no tenderness.  Musculoskeletal: Normal range of motion. She exhibits no edema or tenderness.  Lymphadenopathy:    She has no cervical adenopathy.  Neurological: She is alert and oriented to person, place, and time. She has normal reflexes. No cranial nerve deficit. Coordination normal.  Skin: Skin is warm and dry. No rash noted. She is not diaphoretic.  Psychiatric: Mood, memory and affect normal.    Lab Results  Component Value Date   TSH 0.29* 12/07/2014   Lab Results  Component Value Date   WBC 7.6 12/07/2014   HGB 12.6 12/07/2014   HCT 37.0 12/07/2014   MCV 87.5 12/07/2014   PLT 239.0 12/07/2014   Lab Results  Component Value Date   CREATININE 1.90* 12/07/2014   BUN 23 12/07/2014   NA 140 12/07/2014   K 4.1 12/07/2014   CL 105  12/07/2014   CO2 28 12/07/2014   Lab Results  Component Value Date   ALT 11 12/07/2014   AST 18 12/07/2014   ALKPHOS 48 12/07/2014   BILITOT 0.4 12/07/2014   Lab Results  Component Value Date   CHOL 198 12/07/2014   Lab Results  Component Value Date   HDL 45.40 12/07/2014   Lab Results  Component Value Date   LDLCALC 83 10/16/2013   Lab Results  Component Value Date   TRIG 374.0* 12/07/2014   Lab Results  Component Value Date   CHOLHDL 4 12/07/2014     Assessment & Plan  Hyperlipidemia, mixed Does not tolerate statins but is tolerating Zetia. Encouraged heart healthy diet, increase exercise, avoid trans fats, consider a krill oil cap daily  Chronic kidney disease Follows with [facility]. Doing well  CHF (congestive heart failure) No recent exacerbation  Essential and other specified forms of tremor Follows with [facility]  Hypothyroidism On Levothyroxine, continue to monitor  Pulmonary emboli Tolerating Coumadin mild elevation of INR recently follows closely as directe  Medicare annual wellness visit, subsequent Patient denies any difficulties at home. No trouble with ADLs, depression or falls. No recent changes to vision or hearing. Is UTD with immunizations. Is UTD with screening. Discussed Advanced Directives, patient agrees to bring [place] copies of documents if can. Encouraged heart healthy diet, exercise as tolerated and adequate sleep. See problem list for risk factors and see AVS for recommended preventative health schedule  OSA (obstructive sleep apnea) Using CPAP routinely  Arthritis Knee and back pain, Encouraged moist heat and gentle stretching as tolerated. May try NSAIDs and prescription meds as directed and report if symptoms worsen or seek immediate care. May continue Tramadol prn.

## 2015-02-16 ENCOUNTER — Ambulatory Visit (INDEPENDENT_AMBULATORY_CARE_PROVIDER_SITE_OTHER): Payer: Medicare Other | Admitting: Neurology

## 2015-02-16 ENCOUNTER — Encounter: Payer: Self-pay | Admitting: Neurology

## 2015-02-16 VITALS — BP 120/78 | HR 84 | Ht 64.0 in | Wt 195.0 lb

## 2015-02-16 DIAGNOSIS — G252 Other specified forms of tremor: Principal | ICD-10-CM

## 2015-02-16 DIAGNOSIS — R251 Tremor, unspecified: Secondary | ICD-10-CM | POA: Diagnosis not present

## 2015-02-16 DIAGNOSIS — G250 Essential tremor: Secondary | ICD-10-CM

## 2015-02-16 NOTE — Progress Notes (Signed)
Emily Mitchell was seen today in the movement disorders clinic for neurologic consultation at the request of Penni Homans, MD.  The consultation is for the evaluation of tremor.  Pt states that tremor started 10 years ago and it started in the R hand but seems that it is in the L hand now as well as in the mouth.  It is in the legs as well.  She states that she notes it most when she uses the limbs and it is worse when she is upset.  She has to use a straw with drinking.  She has trouble eating soup on a spoon and sometimes has to drink the soup.  Sometimes she has to eat peas with her fingers.  Her maternal grandma had tremor all her life.  Saw Dr. Erling Cruz years ago and dx with "tremor" per pt (no notes available) and no medication given.  Pt worried she has PD.  02/04/14 update:  Pt is following up today.  She is doing about the same.  She feels that tremor is actually doing well today.  She does not want any medication for it.  She does complain about some neck pain, but it is not disabling.  She has had no falls.  No urinary incontinence.  No loss of bowel control.  She thought she was able to discontinue her Coumadin this month, but that has changed.  She does have a history of pulmonary embolism.  02/16/15 update:  The patient is following up in regards to her tremor.  This patient is accompanied in the office by her granddaughter who supplements the history.  The patient feels that her tremor has been stable and even thinks that she is doing better with time;  She doesn't drink coffee but goes through a 2L of diet dr pepper every few days.  She denies any new medical problems.  She was dx with macular degeneration since last visit and is getting a shot in her eye (right).  ALLERGIES:   Allergies  Allergen Reactions  . Niaspan [Niacin Er] Nausea And Vomiting and Swelling    Swelling in mouth  . Pantoprazole     Mouth sores  . Bupropion Other (See Comments)    Uncontrollable shakes  .  Penicillins Hives  . Statins   . Sulfonamide Derivatives Hives    CURRENT MEDICATIONS:  Current Outpatient Prescriptions on File Prior to Visit  Medication Sig Dispense Refill  . acetaminophen (TYLENOL) 500 MG tablet Take 500 mg by mouth every 6 (six) hours as needed.    Marland Kitchen albuterol (PROVENTIL HFA;VENTOLIN HFA) 108 (90 BASE) MCG/ACT inhaler Inhale 2 puffs into the lungs every 4 (four) hours as needed for wheezing.    . calcium citrate-vitamin D (CITRACAL+D) 315-200 MG-UNIT per tablet Take 1 tablet by mouth daily.     . Cholecalciferol (D3 MAXIMUM STRENGTH) 5000 UNITS capsule Take 5,000 Units by mouth daily.    . Choline Fenofibrate (TRILIPIX) 135 MG capsule Take 1 capsule (135 mg total) by mouth daily. 90 capsule 1  . citalopram (CELEXA) 20 MG tablet Take 1 tablet (20 mg total) by mouth daily. 90 tablet 1  . clonazePAM (KLONOPIN) 1 MG tablet Take 1 tablet (1 mg total) by mouth 3 (three) times daily as needed for anxiety. 90 tablet 2  . Cyanocobalamin (VITAMIN B-12 PO) Take by mouth.    . esomeprazole (NEXIUM) 40 MG capsule Take 1 capsule (40 mg total) by mouth daily at 12 noon. 90 capsule  1  . ezetimibe (ZETIA) 10 MG tablet Take 1 tablet (10 mg total) by mouth daily. 90 tablet 1  . levalbuterol (XOPENEX) 1.25 MG/0.5ML nebulizer solution Take 1.25 mg by nebulization every 4 (four) hours as needed for wheezing. 30 each 1  . levothyroxine (SYNTHROID, LEVOTHROID) 137 MCG tablet Take 1 tablet (137 mcg total) by mouth daily before breakfast. 90 tablet 3  . montelukast (SINGULAIR) 10 MG tablet Take 1 tablet (10 mg total) by mouth at bedtime. Does not take Wed & Sun 90 tablet 1  . nitroGLYCERIN (NITROSTAT) 0.4 MG SL tablet Place 1 tablet (0.4 mg total) under the tongue every 5 (five) minutes as needed for chest pain. 25 tablet 3  . ranitidine (ZANTAC) 300 MG tablet TAKE ONE TABLET BY MOUTH ONCE DAILY AT BEDTIME 30 tablet 6  . traMADol (ULTRAM) 50 MG tablet Take 1 tablet (50 mg total) by mouth every 8  (eight) hours as needed for moderate pain or severe pain. 90 tablet 0  . warfarin (COUMADIN) 5 MG tablet Take as directed 30 tablet 5   No current facility-administered medications on file prior to visit.    PAST MEDICAL HISTORY:   Past Medical History  Diagnosis Date  . Carotid artery occlusion   . Hyperlipidemia   . Hypertension   . COPD (chronic obstructive pulmonary disease)   . Chicken pox as a child  . Mumps 82 yrs old  . GERD (gastroesophageal reflux disease)   . Asthma   . Arthritis   . Hypothyroidism   . Chronic kidney disease     Bright's Disease at age 54   . Tremor   . CHF (congestive heart failure)   . Anxiety and depression 12/14/2008    Qualifier: Diagnosis of  By: [name] LPN, Almyra Free    . Pulmonary emboli 05/26/2013  . H. pylori infection 10/19/2013  . UTI (lower urinary tract infection) 10/19/2013  . Anemia 07/05/2014    PAST SURGICAL HISTORY:   Past Surgical History  Procedure Laterality Date  . Carotid endarterectomy Right 05/10/07    cea  . Knees replaced  2005 and 2011    both knees  . Wisdom tooth extraction    . Abdominal hysterectomy  1984  . Cataract extraction Left     SOCIAL HISTORY:   History   Social History  . Marital Status: Divorced    Spouse Name: N/A  . Number of Children: 2  . Years of Education: N/A   Occupational History  . Not on file.   Social History Main Topics  . Smoking status: Former Smoker -- 2.00 packs/day for 30 years    Types: Cigarettes    Quit date: 12/03/1994  . Smokeless tobacco: Never Used  . Alcohol Use: No  . Drug Use: No  . Sexual Activity: No     Comment: lives with son and friend, no dietary restrictions   Other Topics Concern  . Not on file   Social History Narrative    FAMILY HISTORY:   Family Status  Relation Status Death Age  . Mother Deceased 19    CHF, renal failure  . Sister Alive   . Father Deceased 61    unknown cause of death  . Daughter Alive     healthy  . Son Alive      healthy  . Maternal Grandmother Deceased   . Maternal Grandfather Deceased   . Paternal Grandmother Deceased   . Paternal Grandfather Deceased   . Sister Alive   .  Sister Alive     ROS:  A complete 10 system review of systems was obtained and was unremarkable apart from what is mentioned above.  PHYSICAL EXAMINATION:    VITALS:   Filed Vitals:   02/16/15 1258  BP: 120/78  Pulse: 84  Height: 5\' 4"  (1.626 m)  Weight: 195 lb (88.451 kg)    GEN:  The patient appears stated age and is in NAD. HEENT:  Normocephalic, atraumatic.  The mucous membranes are moist. The superficial temporal arteries are without ropiness or tenderness.  Neurological examination:  Orientation: The patient is alert and oriented x3. Fund of knowledge is appropriate.  Recent and remote memory are intact.  Attention and concentration are normal.    Able to name objects and repeat phrases. Cranial nerves: There is good facial symmetry.  The speech is fluent and clear. Soft palate rises symmetrically and there is no tongue deviation. Hearing is intact to conversational tone. Sensation: Sensation is intact to light and pinprick throughout (facial, trunk, extremities). Vibration is intact at the bilateral big toe. There is no extinction with double simultaneous stimulation. There is no sensory dermatomal level identified. Motor: Strength is 5/5 in the bilateral upper and lower extremities.   Shoulder shrug is equal and symmetric.  There is no pronator drift.   Movement examination: Tone: There is normal tone in the bilateral upper extremities.  The tone in the lower extremities is normal.  Abnormal movements: There is a chin tremor.  There is no resting tremor today but she has had it previously.  There is postural tremor, R more than L.  Archimedes spirals are slightly worse than previous.  Spills some water when asked to pour from one glass to another. Coordination:  There is no decremation with RAM's with any form  of RAM's including alternating supination and pronation of the forearm, hand opening and closing, finger taps, heel taps and toe taps. Gait and Station: The patient has a slightly wide based gait.      ASSESSMENT/PLAN:  1.  Essential tremor.  -The pt has longstanding essential tremor that is somewhat asymmetric.  We did discuss that she could not have beta blockers because of her COPD and that we would have to cautiously use primidone because of her coumadin.  She was just relieved to hear that she did not have PD.    -Told to try and make her diet dr pepper a caffeine free variant. 2.  Hyperreflexia  -She has no evidence of myelopathy but the hyperreflexia certainly could be coming from degenerative changes in the neck.  She does have some neck pain. In the end, she decided that she would not have any surgery right now no matter what the findings so I decided not to image.  IF neck pain becomes a big issue, if she begins to fall or has loss of bladder or bowel control, I would suggest that this is re-visited. 3.  Followup in one year, sooner should new neurologic issues arise.

## 2015-02-18 ENCOUNTER — Ambulatory Visit (INDEPENDENT_AMBULATORY_CARE_PROVIDER_SITE_OTHER): Payer: Medicare Other | Admitting: *Deleted

## 2015-02-18 DIAGNOSIS — Z86711 Personal history of pulmonary embolism: Secondary | ICD-10-CM

## 2015-02-18 DIAGNOSIS — Z7901 Long term (current) use of anticoagulants: Secondary | ICD-10-CM

## 2015-02-18 LAB — POCT INR: INR: 2.7

## 2015-02-18 NOTE — Patient Instructions (Signed)
Continue to take 1 tablet every day except take 1/2 tablet on Monday, Thursday, and Saturday.  Recheck in 4 weeks.

## 2015-02-18 NOTE — Progress Notes (Signed)
Pre visit review using our clinic review tool, if applicable. No additional management support is needed unless otherwise documented below in the visit note. 

## 2015-02-21 ENCOUNTER — Encounter: Payer: Self-pay | Admitting: Family Medicine

## 2015-02-21 DIAGNOSIS — Z0000 Encounter for general adult medical examination without abnormal findings: Secondary | ICD-10-CM

## 2015-02-21 HISTORY — DX: Encounter for general adult medical examination without abnormal findings: Z00.00

## 2015-02-21 NOTE — Assessment & Plan Note (Addendum)
Patient denies any difficulties at home. No trouble with ADLs, depression or falls. No recent changes to vision or hearing. Is UTD with immunizations. Is UTD with screening. Discussed Advanced Directives, patient agrees to bring [place] copies of documents if can. Encouraged heart healthy diet, exercise as tolerated and adequate sleep. See problem list for risk factors and see AVS for recommended preventative health schedule

## 2015-02-21 NOTE — Assessment & Plan Note (Signed)
Tolerating Coumadin mild elevation of INR recently follows closely as directe

## 2015-02-21 NOTE — Assessment & Plan Note (Signed)
Knee and back pain, Encouraged moist heat and gentle stretching as tolerated. May try NSAIDs and prescription meds as directed and report if symptoms worsen or seek immediate care. May continue Tramadol prn.

## 2015-02-21 NOTE — Assessment & Plan Note (Signed)
On Levothyroxine, continue to monitor 

## 2015-02-21 NOTE — Assessment & Plan Note (Signed)
Follows with [facility]

## 2015-02-21 NOTE — Assessment & Plan Note (Signed)
No recent exacerbation 

## 2015-02-21 NOTE — Assessment & Plan Note (Signed)
Follows with [facility]. Doing well

## 2015-02-21 NOTE — Assessment & Plan Note (Signed)
Using CPAP routinely

## 2015-02-21 NOTE — Assessment & Plan Note (Signed)
Does not tolerate statins but is tolerating Zetia. Encouraged heart healthy diet, increase exercise, avoid trans fats, consider a krill oil cap daily

## 2015-03-22 ENCOUNTER — Other Ambulatory Visit (HOSPITAL_BASED_OUTPATIENT_CLINIC_OR_DEPARTMENT_OTHER): Payer: Medicare Other

## 2015-03-24 ENCOUNTER — Ambulatory Visit (HOSPITAL_BASED_OUTPATIENT_CLINIC_OR_DEPARTMENT_OTHER)
Admission: RE | Admit: 2015-03-24 | Discharge: 2015-03-24 | Disposition: A | Discharge status: Home / Self Care | Payer: Medicare Other | Source: Ambulatory Visit | Attending: Family Medicine | Admitting: Family Medicine

## 2015-03-24 DIAGNOSIS — M8580 Other specified disorders of bone density and structure, unspecified site: Secondary | ICD-10-CM | POA: Diagnosis not present

## 2015-03-24 DIAGNOSIS — Z780 Asymptomatic menopausal state: Secondary | ICD-10-CM | POA: Insufficient documentation

## 2015-03-25 ENCOUNTER — Other Ambulatory Visit (INDEPENDENT_AMBULATORY_CARE_PROVIDER_SITE_OTHER): Payer: Medicare Other

## 2015-03-25 ENCOUNTER — Ambulatory Visit (INDEPENDENT_AMBULATORY_CARE_PROVIDER_SITE_OTHER): Payer: Medicare Other

## 2015-03-25 ENCOUNTER — Other Ambulatory Visit: Payer: Self-pay | Admitting: Family Medicine

## 2015-03-25 DIAGNOSIS — Z7901 Long term (current) use of anticoagulants: Secondary | ICD-10-CM | POA: Diagnosis not present

## 2015-03-25 DIAGNOSIS — M8580 Other specified disorders of bone density and structure, unspecified site: Secondary | ICD-10-CM

## 2015-03-25 DIAGNOSIS — Z86711 Personal history of pulmonary embolism: Secondary | ICD-10-CM | POA: Diagnosis not present

## 2015-03-25 LAB — POCT INR: INR: 2.4

## 2015-03-25 NOTE — Patient Instructions (Signed)
Continue to take 1 tablet every day except take 1/2 tablet on Monday, Thursday, and Saturday.  Recheck in 4 weeks.

## 2015-03-25 NOTE — Progress Notes (Signed)
Reviewed recheck PT/INR in 1 month

## 2015-03-25 NOTE — Progress Notes (Signed)
Pre visit review using our clinic review tool, if applicable. No additional management support is needed unless otherwise documented below in the visit note. 

## 2015-03-26 LAB — VITAMIN D 25 HYDROXY (VIT D DEFICIENCY, FRACTURES): VITD: 69.79 ng/mL (ref 30.00–100.00)

## 2015-04-22 ENCOUNTER — Ambulatory Visit (INDEPENDENT_AMBULATORY_CARE_PROVIDER_SITE_OTHER): Payer: Medicare Other

## 2015-04-22 DIAGNOSIS — I2699 Other pulmonary embolism without acute cor pulmonale: Secondary | ICD-10-CM

## 2015-04-22 LAB — POCT INR: INR: 3.2

## 2015-05-06 ENCOUNTER — Ambulatory Visit (INDEPENDENT_AMBULATORY_CARE_PROVIDER_SITE_OTHER): Payer: Medicare Other

## 2015-05-06 DIAGNOSIS — I2699 Other pulmonary embolism without acute cor pulmonale: Secondary | ICD-10-CM

## 2015-05-06 LAB — POCT INR: INR: 3

## 2015-05-06 NOTE — Patient Instructions (Signed)
Continue to take 1 tablet every day except take 1/2 tablet on Monday, Thursday, and Saturday. Recheck in 1 month per Dr. Charlett Blake.

## 2015-05-06 NOTE — Progress Notes (Signed)
Pre visit review using our clinic review tool, if applicable. No additional management support is needed unless otherwise documented below in the visit note. 

## 2015-05-17 ENCOUNTER — Ambulatory Visit (INDEPENDENT_AMBULATORY_CARE_PROVIDER_SITE_OTHER): Payer: Medicare Other | Admitting: Family Medicine

## 2015-05-17 ENCOUNTER — Encounter: Payer: Self-pay | Admitting: Family Medicine

## 2015-05-17 VITALS — BP 126/78 | HR 87 | Temp 97.8°F | Ht 64.0 in | Wt 194.5 lb

## 2015-05-17 DIAGNOSIS — Z23 Encounter for immunization: Secondary | ICD-10-CM

## 2015-05-17 DIAGNOSIS — M25562 Pain in left knee: Secondary | ICD-10-CM

## 2015-05-17 DIAGNOSIS — R079 Chest pain, unspecified: Secondary | ICD-10-CM

## 2015-05-17 DIAGNOSIS — K219 Gastro-esophageal reflux disease without esophagitis: Secondary | ICD-10-CM | POA: Diagnosis not present

## 2015-05-17 DIAGNOSIS — E039 Hypothyroidism, unspecified: Secondary | ICD-10-CM | POA: Diagnosis not present

## 2015-05-17 DIAGNOSIS — M25552 Pain in left hip: Secondary | ICD-10-CM

## 2015-05-17 DIAGNOSIS — E782 Mixed hyperlipidemia: Secondary | ICD-10-CM | POA: Diagnosis not present

## 2015-05-17 DIAGNOSIS — M25561 Pain in right knee: Secondary | ICD-10-CM | POA: Diagnosis not present

## 2015-05-17 DIAGNOSIS — R0789 Other chest pain: Secondary | ICD-10-CM

## 2015-05-17 NOTE — Progress Notes (Signed)
Pre visit review using our clinic review tool, if applicable. No additional management support is needed unless otherwise documented below in the visit note. 

## 2015-05-17 NOTE — Patient Instructions (Signed)
Consider pain patches salon pas patch twice daily or Aspercreme with lidocaine patches    Hip Pain Your hip is the joint between your upper legs and your lower pelvis. The bones, cartilage, tendons, and muscles of your hip joint perform a lot of work each day supporting your body weight and allowing you to move around. Hip pain can range from a minor ache to severe pain in one or both of your hips. Pain may be felt on the inside of the hip joint near the groin, or the outside near the buttocks and upper thigh. You may have swelling or stiffness as well.  HOME CARE INSTRUCTIONS   Take medicines only as directed by your health care provider.  Apply ice to the injured area:  Put ice in a plastic bag.  Place a towel between your skin and the bag.  Leave the ice on for 15-20 minutes at a time, 3-4 times a day.  Keep your leg raised (elevated) when possible to lessen swelling.  Avoid activities that cause pain.  Follow specific exercises as directed by your health care provider.  Sleep with a pillow between your legs on your most comfortable side.  Record how often you have hip pain, the location of the pain, and what it feels like. SEEK MEDICAL CARE IF:   You are unable to put weight on your leg.  Your hip is red or swollen or very tender to touch.  Your pain or swelling continues or worsens after 1 week.  You have increasing difficulty walking.  You have a fever. SEEK IMMEDIATE MEDICAL CARE IF:   You have fallen.  You have a sudden increase in pain and swelling in your hip. MAKE SURE YOU:   Understand these instructions.  Will watch your condition.  Will get help right away if you are not doing well or get worse.   This information is not intended to replace advice given to you by your health care provider. Make sure you discuss any questions you have with your health care provider.   Document Released: 01/04/2010 Document Revised: 08/07/2014 Document Reviewed:  03/13/2013 Elsevier Interactive Patient Education [employer].

## 2015-05-18 ENCOUNTER — Telehealth: Payer: Self-pay | Admitting: Family Medicine

## 2015-05-18 ENCOUNTER — Other Ambulatory Visit: Payer: Self-pay | Admitting: Family Medicine

## 2015-05-18 LAB — CBC
HEMATOCRIT: 37.5 % (ref 36.0–46.0)
HEMOGLOBIN: 12.7 g/dL (ref 12.0–15.0)
MCHC: 33.8 g/dL (ref 30.0–36.0)
MCV: 88.5 fl (ref 78.0–100.0)
PLATELETS: 215 10*3/uL (ref 150.0–400.0)
RBC: 4.24 Mil/uL (ref 3.87–5.11)
RDW: 13.5 % (ref 11.5–15.5)
WBC: 6.9 10*3/uL (ref 4.0–10.5)

## 2015-05-18 LAB — COMPREHENSIVE METABOLIC PANEL
ALK PHOS: 46 U/L (ref 39–117)
ALT: 13 U/L (ref 0–35)
AST: 18 U/L (ref 0–37)
Albumin: 4.1 g/dL (ref 3.5–5.2)
BILIRUBIN TOTAL: 0.4 mg/dL (ref 0.2–1.2)
BUN: 28 mg/dL — AB (ref 6–23)
CO2: 32 mEq/L (ref 19–32)
CREATININE: 1.96 mg/dL — AB (ref 0.40–1.20)
Calcium: 10.1 mg/dL (ref 8.4–10.5)
Chloride: 107 mEq/L (ref 96–112)
GFR: 27.02 mL/min — ABNORMAL LOW (ref >60.00)
Glucose, Bld: 80 mg/dL (ref 70–99)
POTASSIUM: 5 meq/L (ref 3.5–5.1)
SODIUM: 144 meq/L (ref 135–145)
TOTAL PROTEIN: 6.9 g/dL (ref 6.0–8.3)

## 2015-05-18 LAB — LIPID PANEL
Cholesterol: 174 mg/dL (ref 0–200)
HDL: 47.5 mg/dL (ref >39.00)
NONHDL: 126.46
Total CHOL/HDL Ratio: 4
Triglycerides: 289 mg/dL — ABNORMAL HIGH (ref 0.0–149.0)
VLDL: 57.8 mg/dL — AB (ref 0.0–40.0)

## 2015-05-18 LAB — TSH: TSH: 0.6 u[IU]/mL (ref 0.35–4.50)

## 2015-05-18 LAB — LDL CHOLESTEROL, DIRECT: LDL DIRECT: 104 mg/dL

## 2015-05-18 MED ORDER — LEVOTHYROXINE SODIUM 137 MCG PO TABS: 137.0000 ug | ORAL_TABLET | Freq: Every day | ORAL | Status: DC

## 2015-05-18 NOTE — Telephone Encounter (Signed)
Refilled thyroid medication

## 2015-05-18 NOTE — Telephone Encounter (Signed)
Caller name: Aracely Kunsman   Relationship to patient: Self   Can be reached: [phone]  Reason for call: pt says that she is returning your call.

## 2015-05-23 ENCOUNTER — Encounter: Payer: Self-pay | Admitting: Family Medicine

## 2015-05-23 DIAGNOSIS — M25552 Pain in left hip: Secondary | ICD-10-CM

## 2015-05-23 DIAGNOSIS — R0789 Other chest pain: Secondary | ICD-10-CM

## 2015-05-23 HISTORY — DX: Pain in left hip: M25.552

## 2015-05-23 HISTORY — DX: Other chest pain: R07.89

## 2015-05-23 NOTE — Assessment & Plan Note (Addendum)
Encouraged moist heat and gentle stretching as tolerated. May try NSAIDs and prescription meds as directed and report if symptoms worsen or seek immediate care. Allowed pain meds to use prn for severe pain. Referred to orthopaedics

## 2015-05-23 NOTE — Assessment & Plan Note (Signed)
EKG normal today. Has been present for about 3-4 weeks. Describes chest discomfort with some left shoulder and lue pain

## 2015-05-23 NOTE — Assessment & Plan Note (Signed)
On Levothyroxine, continue to monitor 

## 2015-05-23 NOTE — Assessment & Plan Note (Signed)
Encouraged heart healthy diet, increase exercise, avoid trans fats, consider a krill oil cap daily 

## 2015-05-23 NOTE — Progress Notes (Signed)
Subjective:    Patient ID: Emily Mitchell, female    DOB: Jan 04, 1946, 69 y.o.   MRN: [id number]  Chief Complaint  Patient presents with  . Follow-up    HPI Patient is in today for follow-up. Has numerous complaints. She is having increase in left hip pain and has had several falls in the last few months. She describes a sensation of her knees buckling right worse than left she has been walking less as a result. Notes stiffness in her knees and posterior left hip pain. Is also complaining of some atypical chest pain has been struggling with chest discomfort for roughly 3-4 weeks. Has some pain into her left shoulder and left arm. Does not awaken her. Describes occasional nausea but no vomiting. No shortness of breath or palpitations. Denies palp/SOB/HA/congestion/fevers/GI or GU c/o. Taking meds as prescribed  Past Medical History  Diagnosis Date  . Carotid artery occlusion   . Hyperlipidemia   . Hypertension   . COPD (chronic obstructive pulmonary disease) ([place])   . Chicken pox as a child  . Mumps 89 yrs old  . GERD (gastroesophageal reflux disease)   . Asthma   . Arthritis   . Hypothyroidism   . Chronic kidney disease     Bright's Disease at age 41   . Tremor   . CHF (congestive heart failure) ([place])   . Anxiety and depression 12/14/2008    Qualifier: Diagnosis of  By: [name] LPN, Almyra Free    . Pulmonary emboli ([place]) 05/26/2013  . H. pylori infection 10/19/2013  . UTI (lower urinary tract infection) 10/19/2013  . Anemia 07/05/2014  . Medicare annual wellness visit, subsequent 02/21/2015  . Left hip pain 05/23/2015  . Atypical chest pain 05/23/2015    Past Surgical History  Procedure Laterality Date  . Carotid endarterectomy Right 05/10/07    cea  . Knees replaced  2005 and 2011    both knees  . Wisdom tooth extraction    . Abdominal hysterectomy  1984  . Cataract extraction Left     Family History  Problem Relation Age of Onset  . Stroke Mother     mini stroke  .  Kidney disease Mother   . Heart failure Mother   . Hypertension Mother   . Diabetes Sister     type 2  . Hyperlipidemia Sister     Social History   Social History  . Marital Status: Divorced    Spouse Name: N/A  . Number of Children: 2  . Years of Education: N/A   Occupational History  . Not on file.   Social History Main Topics  . Smoking status: Former Smoker -- 2.00 packs/day for 30 years    Types: Cigarettes    Quit date: 12/03/1994  . Smokeless tobacco: Never Used  . Alcohol Use: No  . Drug Use: No  . Sexual Activity: No     Comment: lives with son and friend, no dietary restrictions   Other Topics Concern  . Not on file   Social History Narrative    Outpatient Prescriptions Prior to Visit  Medication Sig Dispense Refill  . acetaminophen (TYLENOL) 500 MG tablet Take 500 mg by mouth every 6 (six) hours as needed.    Marland Kitchen albuterol (PROVENTIL HFA;VENTOLIN HFA) 108 (90 BASE) MCG/ACT inhaler Inhale 2 puffs into the lungs every 4 (four) hours as needed for wheezing.    . calcium citrate-vitamin D (CITRACAL+D) 315-200 MG-UNIT per tablet Take 1 tablet by mouth daily.     Marland Kitchen  Cholecalciferol (D3 MAXIMUM STRENGTH) 5000 UNITS capsule Take 5,000 Units by mouth daily.    . Choline Fenofibrate (TRILIPIX) 135 MG capsule Take 1 capsule (135 mg total) by mouth daily. 90 capsule 1  . citalopram (CELEXA) 20 MG tablet Take 1 tablet (20 mg total) by mouth daily. 90 tablet 1  . clonazePAM (KLONOPIN) 1 MG tablet Take 1 tablet (1 mg total) by mouth 3 (three) times daily as needed for anxiety. 90 tablet 2  . Cyanocobalamin (VITAMIN B-12 PO) Take by mouth.    . esomeprazole (NEXIUM) 40 MG capsule Take 1 capsule (40 mg total) by mouth daily at 12 noon. 90 capsule 1  . ezetimibe (ZETIA) 10 MG tablet Take 1 tablet (10 mg total) by mouth daily. 90 tablet 1  . levalbuterol (XOPENEX) 1.25 MG/0.5ML nebulizer solution Take 1.25 mg by nebulization every 4 (four) hours as needed for wheezing. 30 each 1    . montelukast (SINGULAIR) 10 MG tablet Take 1 tablet (10 mg total) by mouth at bedtime. Does not take Wed & Sun 90 tablet 1  . nitroGLYCERIN (NITROSTAT) 0.4 MG SL tablet Place 1 tablet (0.4 mg total) under the tongue every 5 (five) minutes as needed for chest pain. 25 tablet 3  . ranitidine (ZANTAC) 300 MG tablet TAKE ONE TABLET BY MOUTH ONCE DAILY AT BEDTIME 30 tablet 6  . traMADol (ULTRAM) 50 MG tablet Take 1 tablet (50 mg total) by mouth every 8 (eight) hours as needed for moderate pain or severe pain. 90 tablet 0  . warfarin (COUMADIN) 5 MG tablet Take as directed 30 tablet 5  . levothyroxine (SYNTHROID, LEVOTHROID) 137 MCG tablet Take 1 tablet (137 mcg total) by mouth daily before breakfast. 90 tablet 3   No facility-administered medications prior to visit.    Allergies  Allergen Reactions  . Niaspan [Niacin Er] Nausea And Vomiting and Swelling    Swelling in mouth  . Pantoprazole     Mouth sores  . Bupropion Other (See Comments)    Uncontrollable shakes  . Penicillins Hives  . Statins   . Sulfonamide Derivatives Hives    Review of Systems  Constitutional: Negative for fever and malaise/fatigue.  HENT: Negative for congestion.   Eyes: Negative for discharge.  Respiratory: Negative for shortness of breath.   Cardiovascular: Positive for chest pain. Negative for palpitations and leg swelling.  Gastrointestinal: Positive for nausea. Negative for abdominal pain.  Genitourinary: Negative for dysuria.  Musculoskeletal: Positive for joint pain and falls.  Skin: Negative for rash.  Neurological: Negative for loss of consciousness and headaches.  Endo/Heme/Allergies: Negative for environmental allergies.  Psychiatric/Behavioral: Negative for depression. The patient is not nervous/anxious.        Objective:    Physical Exam  Constitutional: She is oriented to person, place, and time. She appears well-developed and well-nourished. No distress.  HENT:  Head: Normocephalic and  atraumatic.  Nose: Nose normal.  Eyes: Right eye exhibits no discharge. Left eye exhibits no discharge.  Neck: Normal range of motion. Neck supple.  Cardiovascular: Normal rate and regular rhythm.   No murmur heard. Pulmonary/Chest: Effort normal and breath sounds normal.  Abdominal: Soft. Bowel sounds are normal. There is no tenderness.  Musculoskeletal: She exhibits no edema.  Neurological: She is alert and oriented to person, place, and time.  Skin: Skin is warm and dry.  Psychiatric: She has a normal mood and affect.  Nursing note and vitals reviewed.   BP 126/78 mmHg  Pulse 87  Temp(Src) 97.8  F (36.6 C) (Oral)  Ht 5\' 4"  (1.626 m)  Wt 194 lb 8 oz (88.225 kg)  BMI 33.37 kg/m2  SpO2 94% Wt Readings from Last 3 Encounters:  05/17/15 194 lb 8 oz (88.225 kg)  02/16/15 195 lb (88.451 kg)  02/09/15 194 lb 2 oz (88.055 kg)     Lab Results  Component Value Date   WBC 6.9 05/17/2015   HGB 12.7 05/17/2015   HCT 37.5 05/17/2015   PLT 215.0 05/17/2015   GLUCOSE 80 05/17/2015   CHOL 174 05/17/2015   TRIG 289.0* 05/17/2015   HDL 47.50 05/17/2015   LDLDIRECT 104.0 05/17/2015   LDLCALC 83 10/16/2013   ALT 13 05/17/2015   AST 18 05/17/2015   NA 144 05/17/2015   K 5.0 05/17/2015   CL 107 05/17/2015   CREATININE 1.96* 05/17/2015   BUN 28* 05/17/2015   CO2 32 05/17/2015   TSH 0.60 05/17/2015   INR 3.0 05/06/2015   HGBA1C  05/10/2007    5.3 (NOTE)   The ADA recommends the following therapeutic goals for glycemic   control related to Hgb A1C measurement:   Goal of Therapy:   < 7.0% Hgb A1C   Action Suggested:  > 8.0% Hgb A1C   Ref:  Diabetes Care, 22, Suppl. 1, 1999    Lab Results  Component Value Date   TSH 0.60 05/17/2015   Lab Results  Component Value Date   WBC 6.9 05/17/2015   HGB 12.7 05/17/2015   HCT 37.5 05/17/2015   MCV 88.5 05/17/2015   PLT 215.0 05/17/2015   Lab Results  Component Value Date   NA 144 05/17/2015   K 5.0 05/17/2015   CO2 32 05/17/2015     GLUCOSE 80 05/17/2015   BUN 28* 05/17/2015   CREATININE 1.96* 05/17/2015   BILITOT 0.4 05/17/2015   ALKPHOS 46 05/17/2015   AST 18 05/17/2015   ALT 13 05/17/2015   PROT 6.9 05/17/2015   ALBUMIN 4.1 05/17/2015   CALCIUM 10.1 05/17/2015   ANIONGAP 13 03/14/2014   GFR 27.02* 05/17/2015   Lab Results  Component Value Date   CHOL 174 05/17/2015   Lab Results  Component Value Date   HDL 47.50 05/17/2015   Lab Results  Component Value Date   LDLCALC 83 10/16/2013   Lab Results  Component Value Date   TRIG 289.0* 05/17/2015   Lab Results  Component Value Date   CHOLHDL 4 05/17/2015   Lab Results  Component Value Date   HGBA1C  05/10/2007    5.3 (NOTE)   The ADA recommends the following therapeutic goals for glycemic   control related to Hgb A1C measurement:   Goal of Therapy:   < 7.0% Hgb A1C   Action Suggested:  > 8.0% Hgb A1C   Ref:  Diabetes Care, 22, Suppl. 1, 1999       Assessment & Plan:   Problem List Items Addressed This Visit    Left hip pain    Encouraged moist heat and gentle stretching as tolerated. May try NSAIDs and prescription meds as directed and report if symptoms worsen or seek immediate care. Allowed pain meds to use prn for severe pain. Referred to orthopaedics      Hypothyroidism    On Levothyroxine, continue to monitor      Relevant Orders   TSH (Completed)   CBC (Completed)   Lipid panel (Completed)   Comprehensive metabolic panel (Completed)   Hyperlipidemia, mixed    Encouraged heart healthy diet,  increase exercise, avoid trans fats, consider a krill oil cap daily      GERD    Avoid offending foods, start probiotics. Do not eat large meals in late evening and consider raising head of bed.       Atypical chest pain    EKG normal today. Has been present for about 3-4 weeks. Describes chest discomfort with some left shoulder and lue pain       Other Visit Diagnoses    Encounter for immunization    -  Primary    Chest pain,  unspecified chest pain type        Relevant Orders    Ambulatory referral to Cardiology    TSH (Completed)    CBC (Completed)    Lipid panel (Completed)    Comprehensive metabolic panel (Completed)    EKG 12-Lead (Completed)    Arthralgia of both knees        Relevant Orders    Ambulatory referral to Orthopedic Surgery    TSH (Completed)    CBC (Completed)    Lipid panel (Completed)    Comprehensive metabolic panel (Completed)       I am having Ms. Stys maintain her calcium citrate-vitamin D, Cholecalciferol, albuterol, nitroGLYCERIN, levalbuterol, Cyanocobalamin (VITAMIN B-12 PO), acetaminophen, warfarin, citalopram, esomeprazole, montelukast, ezetimibe, traMADol, clonazePAM, Choline Fenofibrate, and ranitidine.  No orders of the defined types were placed in this encounter.     Penni Homans, MD

## 2015-05-23 NOTE — Assessment & Plan Note (Signed)
Avoid offending foods, start probiotics. Do not eat large meals in late evening and consider raising head of bed.  

## 2015-06-02 ENCOUNTER — Ambulatory Visit: Payer: Medicare Other | Admitting: Internal Medicine

## 2015-06-03 ENCOUNTER — Ambulatory Visit (INDEPENDENT_AMBULATORY_CARE_PROVIDER_SITE_OTHER): Payer: Commercial Managed Care - HMO | Admitting: Behavioral Health

## 2015-06-03 DIAGNOSIS — Z86711 Personal history of pulmonary embolism: Secondary | ICD-10-CM

## 2015-06-03 DIAGNOSIS — Z7901 Long term (current) use of anticoagulants: Secondary | ICD-10-CM

## 2015-06-03 LAB — POCT INR: INR: 2.9

## 2015-06-03 NOTE — Patient Instructions (Signed)
Continue to take 1 tablet every day except take 1/2 tablet on Monday, Thursday, and Saturday. Recheck in 1 month per Dr. Charlett Blake.

## 2015-06-03 NOTE — Progress Notes (Signed)
Pre visit review using our clinic review tool, if applicable. No additional management support is needed unless otherwise documented below in the visit note. 

## 2015-06-14 ENCOUNTER — Other Ambulatory Visit: Payer: Self-pay | Admitting: Family Medicine

## 2015-06-14 DIAGNOSIS — E785 Hyperlipidemia, unspecified: Secondary | ICD-10-CM

## 2015-06-14 DIAGNOSIS — K219 Gastro-esophageal reflux disease without esophagitis: Secondary | ICD-10-CM

## 2015-06-14 MED ORDER — ESOMEPRAZOLE MAGNESIUM 40 MG PO CPDR: 40.0000 mg | DELAYED_RELEASE_CAPSULE | Freq: Every day | ORAL | Status: DC

## 2015-06-14 MED ORDER — RANITIDINE HCL 300 MG PO TABS: 300.0000 mg | ORAL_TABLET | Freq: Every day | ORAL | Status: DC

## 2015-06-14 MED ORDER — CHOLINE FENOFIBRATE 135 MG PO CPDR
135.0000 mg | DELAYED_RELEASE_CAPSULE | Freq: Every day | ORAL | Status: DC
Start: 2015-06-14 — End: 2015-09-13

## 2015-06-14 MED ORDER — EZETIMIBE 10 MG PO TABS: 10.0000 mg | ORAL_TABLET | Freq: Every day | ORAL | Status: DC

## 2015-06-14 MED ORDER — MONTELUKAST SODIUM 10 MG PO TABS: 10.0000 mg | ORAL_TABLET | Freq: Every day | ORAL | Status: DC

## 2015-06-14 MED ORDER — LEVOTHYROXINE SODIUM 137 MCG PO TABS: 137.0000 ug | ORAL_TABLET | Freq: Every day | ORAL | Status: DC

## 2015-06-14 MED ORDER — CITALOPRAM HYDROBROMIDE 20 MG PO TABS: 20.0000 mg | ORAL_TABLET | Freq: Every day | ORAL | Status: DC

## 2015-06-15 ENCOUNTER — Ambulatory Visit (INDEPENDENT_AMBULATORY_CARE_PROVIDER_SITE_OTHER): Payer: Commercial Managed Care - HMO | Admitting: Internal Medicine

## 2015-06-15 ENCOUNTER — Encounter: Payer: Self-pay | Admitting: Internal Medicine

## 2015-06-15 VITALS — BP 106/74 | HR 76 | Ht 64.0 in | Wt 193.5 lb

## 2015-06-15 DIAGNOSIS — E782 Mixed hyperlipidemia: Secondary | ICD-10-CM

## 2015-06-15 DIAGNOSIS — F419 Anxiety disorder, unspecified: Secondary | ICD-10-CM

## 2015-06-15 DIAGNOSIS — R0789 Other chest pain: Secondary | ICD-10-CM

## 2015-06-15 DIAGNOSIS — I739 Peripheral vascular disease, unspecified: Secondary | ICD-10-CM

## 2015-06-15 DIAGNOSIS — G4733 Obstructive sleep apnea (adult) (pediatric): Secondary | ICD-10-CM

## 2015-06-15 DIAGNOSIS — F418 Other specified anxiety disorders: Secondary | ICD-10-CM

## 2015-06-15 DIAGNOSIS — R079 Chest pain, unspecified: Secondary | ICD-10-CM

## 2015-06-15 DIAGNOSIS — I5032 Chronic diastolic (congestive) heart failure: Secondary | ICD-10-CM

## 2015-06-15 DIAGNOSIS — I779 Disorder of arteries and arterioles, unspecified: Secondary | ICD-10-CM

## 2015-06-15 DIAGNOSIS — F32A Depression, unspecified: Secondary | ICD-10-CM

## 2015-06-15 DIAGNOSIS — F329 Major depressive disorder, single episode, unspecified: Secondary | ICD-10-CM

## 2015-06-15 NOTE — Patient Instructions (Signed)
Your physician has requested that you have a lexiscan myoview. For further information please visit [email or web address]. Please follow instruction sheet, as given.  Your physician recommends that you schedule a follow-up appointment after your test (first available with Dr. Debara Pickett)

## 2015-06-16 DIAGNOSIS — I779 Disorder of arteries and arterioles, unspecified: Secondary | ICD-10-CM | POA: Insufficient documentation

## 2015-06-16 DIAGNOSIS — I739 Peripheral vascular disease, unspecified: Secondary | ICD-10-CM

## 2015-06-16 HISTORY — DX: Disorder of arteries and arterioles, unspecified: I77.9

## 2015-06-16 NOTE — Progress Notes (Signed)
CC: Chest pain, dyspnea  HPI: 69 year old female previously seen by Dr. Stanford Breed in [facility] for evaluation of congestive heart failure. Previous right carotid endarterectomy. Carotid Dopplers in May of 2014 showed a 40-59% left stenosis. There was occlusion of the right carotid. Followed by vascular surgery. Patient has chronic dyspnea on exertion from COPD. she was seen in 2014 for dyspnea and chest pressure. She underwent a nuclear stress test which was negative for ischemia. EF was 74% with mild breast attenuation. Recently she underwent an echocardiogram which showed an EF of 55-60% with stage II diastolic dysfunction. She tells me that fairly recently she was hospitalized with a pulmonary embolus and and now is on warfarin. She also has COPD, anxiety and a number of other medical problems including hypertension and dyslipidemia. The chest pain is described as sharp and bandlike that extends up around her chest. It feels to her like her "bra is too tight". She denies any worsening cough, wheezing, fever or chills.  Current Outpatient Prescriptions  Medication Sig Dispense Refill  . acetaminophen (TYLENOL) 500 MG tablet Take 500 mg by mouth every 6 (six) hours as needed.    Marland Kitchen albuterol (PROVENTIL HFA;VENTOLIN HFA) 108 (90 BASE) MCG/ACT inhaler Inhale 2 puffs into the lungs every 4 (four) hours as needed for wheezing.    . calcium citrate-vitamin D (CITRACAL+D) 315-200 MG-UNIT per tablet Take 1 tablet by mouth daily.     . Cholecalciferol (D3 MAXIMUM STRENGTH) 5000 UNITS capsule Take 5,000 Units by mouth daily.    . Choline Fenofibrate (TRILIPIX) 135 MG capsule Take 1 capsule (135 mg total) by mouth daily. 90 capsule 1  . citalopram (CELEXA) 20 MG tablet Take 1 tablet (20 mg total) by mouth daily. 90 tablet 1  . clonazePAM (KLONOPIN) 1 MG tablet Take 1 tablet (1 mg total) by mouth 3 (three) times daily as needed for anxiety. 90 tablet 2  . Cyanocobalamin (VITAMIN B-12 PO) Take by mouth.      . esomeprazole (NEXIUM) 40 MG capsule Take 1 capsule (40 mg total) by mouth daily at 12 noon. 90 capsule 1  . ezetimibe (ZETIA) 10 MG tablet Take 1 tablet (10 mg total) by mouth daily. 90 tablet 1  . levalbuterol (XOPENEX) 1.25 MG/0.5ML nebulizer solution Take 1.25 mg by nebulization every 4 (four) hours as needed for wheezing. 30 each 1  . levothyroxine (SYNTHROID, LEVOTHROID) 137 MCG tablet Take 1 tablet (137 mcg total) by mouth daily before breakfast. 90 tablet 1  . montelukast (SINGULAIR) 10 MG tablet Take 1 tablet (10 mg total) by mouth at bedtime. Does not take Wed & Sun 90 tablet 1  . nitroGLYCERIN (NITROSTAT) 0.4 MG SL tablet Place 1 tablet (0.4 mg total) under the tongue every 5 (five) minutes as needed for chest pain. 25 tablet 3  . ranitidine (ZANTAC) 300 MG tablet Take 1 tablet (300 mg total) by mouth daily with breakfast. 90 tablet 1  . telmisartan-hydrochlorothiazide (MICARDIS HCT) 80-12.5 MG tablet Take 0.5 tablets by mouth daily. Dr. Servando Salina    . traMADol (ULTRAM) 50 MG tablet Take 1 tablet (50 mg total) by mouth every 8 (eight) hours as needed for moderate pain or severe pain. 90 tablet 0  . warfarin (COUMADIN) 5 MG tablet Take as directed 30 tablet 5   No current facility-administered medications for this visit.    Allergies  Allergen Reactions  . Niaspan [Niacin Er] Nausea And Vomiting and Swelling    Swelling in mouth  . Pantoprazole  Mouth sores  . Bupropion Other (See Comments)    Uncontrollable shakes  . Penicillins Hives  . Statins   . Sulfonamide Derivatives Hives     Past Medical History  Diagnosis Date  . Carotid artery occlusion   . Hyperlipidemia   . Hypertension   . COPD (chronic obstructive pulmonary disease) ([place])   . Chicken pox as a child  . Mumps 47 yrs old  . GERD (gastroesophageal reflux disease)   . Asthma   . Arthritis   . Hypothyroidism   . Chronic kidney disease     Bright's Disease at age 70   . Tremor   . CHF (congestive  heart failure) ([place])   . Anxiety and depression 12/14/2008    Qualifier: Diagnosis of  By: [name] LPN, Almyra Free    . Pulmonary emboli ([place]) 05/26/2013  . H. pylori infection 10/19/2013  . UTI (lower urinary tract infection) 10/19/2013  . Anemia 07/05/2014  . Medicare annual wellness visit, subsequent 02/21/2015  . Left hip pain 05/23/2015  . Atypical chest pain 05/23/2015    Past Surgical History  Procedure Laterality Date  . Carotid endarterectomy Right 05/10/07    cea  . Knees replaced  2005 and 2011    both knees  . Wisdom tooth extraction    . Abdominal hysterectomy  1984  . Cataract extraction Left     Social History   Social History  . Marital Status: Divorced    Spouse Name: N/A  . Number of Children: 2  . Years of Education: N/A   Occupational History  . Not on file.   Social History Main Topics  . Smoking status: Former Smoker -- 2.00 packs/day for 30 years    Types: Cigarettes    Quit date: 12/03/1994  . Smokeless tobacco: Never Used  . Alcohol Use: No  . Drug Use: No  . Sexual Activity: No     Comment: lives with son and friend, no dietary restrictions   Other Topics Concern  . Not on file   Social History Narrative    Family History  Problem Relation Age of Onset  . Stroke Mother     mini stroke  . Kidney disease Mother   . Heart failure Mother   . Hypertension Mother   . Diabetes Sister     type 2  . Hyperlipidemia Sister     ROS: no fevers or chills, productive cough, hemoptysis, dysphasia, odynophagia, melena, hematochezia, dysuria, hematuria, rash, seizure activity, orthopnea, PND, pedal edema, claudication. Remaining systems are negative.  Physical Exam:   Blood pressure 106/74, pulse 76, height 5\' 4"  (1.626 m), weight 193 lb 8 oz (87.771 kg).  General:  Well developed/obese in NAD Skin warm/dry Patient not depressed No peripheral clubbing Back-normal HEENT-normal/normal eyelids Neck supple/normal carotid upstroke bilaterally; no  bruits; no JVD; no thyromegaly chest - CTA/ normal expansion CV - RRR/normal S1 and S2; no murmurs, rubs or gallops;  PMI nondisplaced Abdomen -NT/ND, no HSM, no mass, + bowel sounds, no bruit 2+ femoral pulses, no bruits Ext-no edema, chords, 2+ DP Neuro-grossly nonfocal  EKG: Normal sinus rhythm at 76  Impression: Patient Active Problem List   Diagnosis Date Noted  . Right-sided carotid artery disease ([place]) 06/16/2015  . Left hip pain 05/23/2015  . Atypical chest pain 05/23/2015  . Medicare annual wellness visit, subsequent 02/21/2015  . Macular degeneration 09/28/2014  . Anemia 07/05/2014  . Essential and other specified forms of tremor 11/05/2013  . H. pylori infection 10/19/2013  .  UTI (lower urinary tract infection) 10/19/2013  . Hyperlipidemia, mixed 05/26/2013  . Pulmonary emboli ([place]) 05/26/2013  . Arthritis   . Chronic kidney disease   . Hypothyroidism   . CHF (congestive heart failure) ([place])   . Occlusion and stenosis of carotid artery without mention of cerebral infarction 12/17/2012  . Pain in limb 12/17/2012  . Numbness of finger 12/17/2012  . Constipation 12/17/2008  . Anxiety and depression 12/14/2008  . COPD (chronic obstructive pulmonary disease) ([place]) 12/14/2008  . GERD 12/14/2008  . OSA (obstructive sleep apnea) 12/14/2008   Plan: 1. Mrs. Vosters is describing chest pain which sounds atypical although she does have PAD and diastolic dysfunction on echo. She appears euvolemic on exam today. She is also describing bandlike chest pressure. She did have a negative stress test in 2014. I like to repeat that today to make sure that she has not developed any new ischemia. Plan to see her back to review those results in 4-6 weeks. Thanks again for the kind referral.  Pixie Casino, MD, [facility] Attending Cardiologist [place]

## 2015-06-17 ENCOUNTER — Encounter: Payer: Self-pay | Admitting: *Deleted

## 2015-06-18 ENCOUNTER — Telehealth: Payer: Self-pay | Admitting: Family Medicine

## 2015-06-18 DIAGNOSIS — I2699 Other pulmonary embolism without acute cor pulmonale: Secondary | ICD-10-CM

## 2015-06-18 MED ORDER — WARFARIN SODIUM 5 MG PO TABS: ORAL_TABLET | ORAL | Status: DC

## 2015-06-18 NOTE — Telephone Encounter (Signed)
Pharmacy: [facility] [place], [place] - [address] [phone] (Phone) [phone] (Fax)         Reason for call:  [facility] requesting verbal orders for warfarin (COUMADIN) 5 MG tablet

## 2015-06-25 ENCOUNTER — Other Ambulatory Visit: Payer: Self-pay | Admitting: Family Medicine

## 2015-06-26 ENCOUNTER — Other Ambulatory Visit: Payer: Self-pay | Admitting: Family Medicine

## 2015-06-28 ENCOUNTER — Other Ambulatory Visit: Payer: Self-pay | Admitting: Family Medicine

## 2015-06-28 NOTE — Telephone Encounter (Signed)
Faxed hardcopy for clonazepam to [facility]

## 2015-07-01 ENCOUNTER — Telehealth (HOSPITAL_COMMUNITY): Payer: Self-pay

## 2015-07-01 ENCOUNTER — Ambulatory Visit (INDEPENDENT_AMBULATORY_CARE_PROVIDER_SITE_OTHER): Payer: Commercial Managed Care - HMO | Admitting: Behavioral Health

## 2015-07-01 DIAGNOSIS — Z86711 Personal history of pulmonary embolism: Secondary | ICD-10-CM

## 2015-07-01 DIAGNOSIS — Z7901 Long term (current) use of anticoagulants: Secondary | ICD-10-CM

## 2015-07-01 LAB — POCT INR: INR: 2.8

## 2015-07-01 NOTE — Telephone Encounter (Signed)
Encounter complete. 

## 2015-07-01 NOTE — Progress Notes (Signed)
Pre visit review using our clinic review tool, if applicable. No additional management support is needed unless otherwise documented below in the visit note. 

## 2015-07-01 NOTE — Patient Instructions (Signed)
Continue to take 1 tablet every day except take 1/2 tablet on Monday, Thursday, and Saturday. Recheck in 1 month per Dr. Charlett Blake.

## 2015-07-02 ENCOUNTER — Telehealth (HOSPITAL_COMMUNITY): Payer: Self-pay

## 2015-07-02 NOTE — Telephone Encounter (Signed)
Encounter complete. 

## 2015-07-06 ENCOUNTER — Ambulatory Visit (HOSPITAL_COMMUNITY)
Admission: RE | Admit: 2015-07-06 | Discharge: 2015-07-06 | Disposition: A | Discharge status: Home / Self Care | Payer: Commercial Managed Care - HMO | Source: Ambulatory Visit | Attending: Cardiology | Admitting: Cardiology

## 2015-07-06 DIAGNOSIS — R002 Palpitations: Secondary | ICD-10-CM | POA: Diagnosis not present

## 2015-07-06 DIAGNOSIS — I739 Peripheral vascular disease, unspecified: Secondary | ICD-10-CM

## 2015-07-06 DIAGNOSIS — N189 Chronic kidney disease, unspecified: Secondary | ICD-10-CM | POA: Diagnosis not present

## 2015-07-06 DIAGNOSIS — I129 Hypertensive chronic kidney disease with stage 1 through stage 4 chronic kidney disease, or unspecified chronic kidney disease: Secondary | ICD-10-CM | POA: Insufficient documentation

## 2015-07-06 DIAGNOSIS — I779 Disorder of arteries and arterioles, unspecified: Secondary | ICD-10-CM | POA: Insufficient documentation

## 2015-07-06 DIAGNOSIS — Z8249 Family history of ischemic heart disease and other diseases of the circulatory system: Secondary | ICD-10-CM | POA: Diagnosis not present

## 2015-07-06 DIAGNOSIS — Z6833 Body mass index (BMI) 33.0-33.9, adult: Secondary | ICD-10-CM | POA: Diagnosis not present

## 2015-07-06 DIAGNOSIS — Z87891 Personal history of nicotine dependence: Secondary | ICD-10-CM | POA: Insufficient documentation

## 2015-07-06 DIAGNOSIS — R42 Dizziness and giddiness: Secondary | ICD-10-CM | POA: Diagnosis not present

## 2015-07-06 DIAGNOSIS — E669 Obesity, unspecified: Secondary | ICD-10-CM | POA: Insufficient documentation

## 2015-07-06 DIAGNOSIS — R5383 Other fatigue: Secondary | ICD-10-CM | POA: Diagnosis not present

## 2015-07-06 DIAGNOSIS — R0602 Shortness of breath: Secondary | ICD-10-CM | POA: Insufficient documentation

## 2015-07-06 DIAGNOSIS — R0609 Other forms of dyspnea: Secondary | ICD-10-CM | POA: Diagnosis not present

## 2015-07-06 DIAGNOSIS — R079 Chest pain, unspecified: Secondary | ICD-10-CM

## 2015-07-06 LAB — MYOCARDIAL PERFUSION IMAGING
CHL CUP NUCLEAR SRS: 0
CHL CUP RESTING HR STRESS: 77 {beats}/min
LV dias vol: 66 mL
LVSYSVOL: 16 mL
NUC STRESS TID: 1.07
Peak HR: 88 {beats}/min
SDS: 1
SSS: 1

## 2015-07-06 MED ORDER — TECHNETIUM TC 99M SESTAMIBI GENERIC - CARDIOLITE
29.6000 | Freq: Once | INTRAVENOUS | Status: AC | PRN
  Administered 2015-07-06: 30 via INTRAVENOUS

## 2015-07-06 MED ORDER — TECHNETIUM TC 99M SESTAMIBI GENERIC - CARDIOLITE
10.7000 | Freq: Once | INTRAVENOUS | Status: AC | PRN
  Administered 2015-07-06: 11 via INTRAVENOUS

## 2015-07-06 MED ORDER — REGADENOSON 0.4 MG/5ML IV SOLN
0.4000 mg | Freq: Once | INTRAVENOUS | Status: AC
  Administered 2015-07-06: 0.4 mg via INTRAVENOUS

## 2015-07-29 ENCOUNTER — Ambulatory Visit (INDEPENDENT_AMBULATORY_CARE_PROVIDER_SITE_OTHER): Payer: Commercial Managed Care - HMO

## 2015-07-29 DIAGNOSIS — I2699 Other pulmonary embolism without acute cor pulmonale: Secondary | ICD-10-CM

## 2015-07-29 LAB — POCT INR: INR: 3.3

## 2015-07-29 NOTE — Progress Notes (Signed)
Pre visit review using our clinic review tool, if applicable. No additional management support is needed unless otherwise documented below in the visit note.  Patient in for INR check. INR = 3.3 INR Goal = 2.0-3.0  Patient states she has not missed any doses nor has she taken any extra doses. States she was on ABT for 1 week since last INR check.  Per Dr. Etter Sjogren, continue Coumadin 5 mg T,We,Fri,Sun. and 2.5.mg Mo,Thur,and Sat. Patient to return for INR check in 2 weeks. Patient voiced understanding.

## 2015-07-29 NOTE — Patient Instructions (Signed)
Continue to take 1 tablet every day except take 1/2 tablet on Monday, Thursday, and Saturday. Recheck in 2 weeks per Dr. Etter Sjogren covering provider.  Appointment scheduled for August 12, 2015 at 2:15 p.m.

## 2015-08-10 ENCOUNTER — Ambulatory Visit: Payer: Commercial Managed Care - HMO | Admitting: Internal Medicine

## 2015-08-12 ENCOUNTER — Ambulatory Visit (INDEPENDENT_AMBULATORY_CARE_PROVIDER_SITE_OTHER): Payer: Commercial Managed Care - HMO | Admitting: Behavioral Health

## 2015-08-12 DIAGNOSIS — Z7901 Long term (current) use of anticoagulants: Secondary | ICD-10-CM

## 2015-08-12 DIAGNOSIS — Z86711 Personal history of pulmonary embolism: Secondary | ICD-10-CM | POA: Diagnosis not present

## 2015-08-12 LAB — POCT INR: INR: 2.5

## 2015-08-12 NOTE — Patient Instructions (Signed)
Per Dr.Blyth: Continue taking 1 tablet every day, except take 1/2 tablet on Monday, Thursday, and Saturday. Recheck INR in 4 weeks.  

## 2015-08-12 NOTE — Progress Notes (Signed)
RN note reviewed. Agree with documention and plan. 

## 2015-08-12 NOTE — Progress Notes (Signed)
Pre visit review using our clinic review tool, if applicable. No additional management support is needed unless otherwise documented below in the visit note. 

## 2015-08-25 ENCOUNTER — Telehealth: Payer: Self-pay | Admitting: Family Medicine

## 2015-08-25 ENCOUNTER — Other Ambulatory Visit: Payer: Self-pay | Admitting: Family Medicine

## 2015-08-25 NOTE — Telephone Encounter (Signed)
Emily Mitchell at [facility] Ph# [phone] fax# [phone] - Dr. Collins [place] Radionchenko [facility] pt - Pt has appt 08/26/15 9:15am for injections in eye for CPT 67028 and J9035, dx H35.3211. They have 3 other visits they have not been paid for bc pt didn't get referral.  Please enter ASAP.

## 2015-08-26 ENCOUNTER — Other Ambulatory Visit: Payer: Self-pay | Admitting: Family Medicine

## 2015-08-26 DIAGNOSIS — H353211 Exudative age-related macular degeneration, right eye, with active choroidal neovascularization: Secondary | ICD-10-CM | POA: Insufficient documentation

## 2015-08-26 HISTORY — DX: Exudative age-related macular degeneration, right eye, with active choroidal neovascularization: H35.3211

## 2015-08-27 ENCOUNTER — Ambulatory Visit: Payer: Medicare Other | Admitting: Family Medicine

## 2015-09-02 ENCOUNTER — Encounter: Payer: Self-pay | Admitting: Internal Medicine

## 2015-09-02 ENCOUNTER — Ambulatory Visit (INDEPENDENT_AMBULATORY_CARE_PROVIDER_SITE_OTHER): Payer: Commercial Managed Care - HMO | Admitting: Internal Medicine

## 2015-09-02 VITALS — BP 120/62 | HR 70 | Ht 64.0 in | Wt 191.4 lb

## 2015-09-02 DIAGNOSIS — J438 Other emphysema: Secondary | ICD-10-CM | POA: Diagnosis not present

## 2015-09-02 DIAGNOSIS — F418 Other specified anxiety disorders: Secondary | ICD-10-CM | POA: Diagnosis not present

## 2015-09-02 DIAGNOSIS — R0789 Other chest pain: Secondary | ICD-10-CM | POA: Diagnosis not present

## 2015-09-02 DIAGNOSIS — F419 Anxiety disorder, unspecified: Principal | ICD-10-CM

## 2015-09-02 DIAGNOSIS — F32A Depression, unspecified: Secondary | ICD-10-CM

## 2015-09-02 DIAGNOSIS — F329 Major depressive disorder, single episode, unspecified: Secondary | ICD-10-CM

## 2015-09-02 NOTE — Patient Instructions (Signed)
Dr Hilty recommends that you follow-up with him as needed. 

## 2015-09-05 NOTE — Progress Notes (Signed)
CC: Follow-up stress test  HPI: 69 year old female previously seen by Dr. Stanford Breed in [facility] for evaluation of congestive heart failure. Previous right carotid endarterectomy. Carotid Dopplers in May of 2014 showed a 40-59% left stenosis. There was occlusion of the right carotid. Followed by vascular surgery. Patient has chronic dyspnea on exertion from COPD. she was seen in 2014 for dyspnea and chest pressure. She underwent a nuclear stress test which was negative for ischemia. EF was 74% with mild breast attenuation. Recently she underwent an echocardiogram which showed an EF of 55-60% with stage II diastolic dysfunction. She tells me that fairly recently she was hospitalized with a pulmonary embolus and and now is on warfarin. She also has COPD, anxiety and a number of other medical problems including hypertension and dyslipidemia. The chest pain is described as sharp and bandlike that extends up around her chest. It feels to her like her "bra is too tight". She denies any worsening cough, wheezing, fever or chills.  Emily Mitchell returns today to follow-up a stress test. This was negative for ischemia with normal LV function. She says her chest pain has subsided.  Current Outpatient Prescriptions  Medication Sig Dispense Refill  . acetaminophen (TYLENOL) 500 MG tablet Take 500 mg by mouth every 6 (six) hours as needed.    Marland Kitchen albuterol (PROVENTIL HFA;VENTOLIN HFA) 108 (90 BASE) MCG/ACT inhaler Inhale 2 puffs into the lungs every 4 (four) hours as needed for wheezing.    . calcium citrate-vitamin D (CITRACAL+D) 315-200 MG-UNIT per tablet Take 1 tablet by mouth daily.     . Cholecalciferol (D3 MAXIMUM STRENGTH) 5000 UNITS capsule Take 5,000 Units by mouth daily.    . Choline Fenofibrate (TRILIPIX) 135 MG capsule Take 1 capsule (135 mg total) by mouth daily. 90 capsule 1  . citalopram (CELEXA) 20 MG tablet Take 1 tablet (20 mg total) by mouth daily. 90 tablet 1  . clonazePAM (KLONOPIN) 1 MG  tablet TAKE ONE TABLET BY MOUTH THREE TIMES DAILY AS NEEDED FOR ANXIETY. 90 tablet 0  . Cyanocobalamin (VITAMIN B-12 PO) Take by mouth.    . esomeprazole (NEXIUM) 40 MG capsule Take 1 capsule (40 mg total) by mouth daily at 12 noon. 90 capsule 1  . ezetimibe (ZETIA) 10 MG tablet Take 1 tablet (10 mg total) by mouth daily. 90 tablet 1  . levalbuterol (XOPENEX) 1.25 MG/0.5ML nebulizer solution Take 1.25 mg by nebulization every 4 (four) hours as needed for wheezing. 30 each 1  . levothyroxine (SYNTHROID, LEVOTHROID) 137 MCG tablet Take 1 tablet (137 mcg total) by mouth daily before breakfast. 90 tablet 1  . montelukast (SINGULAIR) 10 MG tablet Take 1 tablet (10 mg total) by mouth at bedtime. Does not take Wed & Sun 90 tablet 1  . nitroGLYCERIN (NITROSTAT) 0.4 MG SL tablet Place 1 tablet (0.4 mg total) under the tongue every 5 (five) minutes as needed for chest pain. 25 tablet 3  . ranitidine (ZANTAC) 300 MG tablet Take 1 tablet (300 mg total) by mouth daily with breakfast. 90 tablet 1  . telmisartan-hydrochlorothiazide (MICARDIS HCT) 80-12.5 MG tablet Take 0.5 tablets by mouth daily. Dr. Servando Salina    . traMADol (ULTRAM) 50 MG tablet Take 1 tablet (50 mg total) by mouth every 8 (eight) hours as needed for moderate pain or severe pain. 90 tablet 0  . warfarin (COUMADIN) 5 MG tablet TAKE AS DIRECTED 90 tablet 0   No current facility-administered medications for this visit.    Allergies  Allergen Reactions  . Niaspan [Niacin Er] Nausea And Vomiting and Swelling    Swelling in mouth  . Pantoprazole     Mouth sores  . Bupropion Other (See Comments)    Uncontrollable shakes  . Penicillins Hives  . Statins   . Sulfonamide Derivatives Hives  . Apple Rash  . Banana Rash  . Milk-Related Compounds Rash     Past Medical History  Diagnosis Date  . Carotid artery occlusion   . Hyperlipidemia   . Hypertension   . COPD (chronic obstructive pulmonary disease) ([place])   . Chicken pox as a child  .  Mumps 72 yrs old  . GERD (gastroesophageal reflux disease)   . Asthma   . Arthritis   . Hypothyroidism   . Chronic kidney disease     Bright's Disease at age 72   . Tremor   . CHF (congestive heart failure) ([place])   . Anxiety and depression 12/14/2008    Qualifier: Diagnosis of  By: [name] LPN, Almyra Free    . Pulmonary emboli ([place]) 05/26/2013  . H. pylori infection 10/19/2013  . UTI (lower urinary tract infection) 10/19/2013  . Anemia 07/05/2014  . Medicare annual wellness visit, subsequent 02/21/2015  . Left hip pain 05/23/2015  . Atypical chest pain 05/23/2015    Past Surgical History  Procedure Laterality Date  . Carotid endarterectomy Right 05/10/07    cea  . Knees replaced  2005 and 2011    both knees  . Wisdom tooth extraction    . Abdominal hysterectomy  1984  . Cataract extraction Left     Social History   Social History  . Marital Status: Divorced    Spouse Name: N/A  . Number of Children: 2  . Years of Education: N/A   Occupational History  . Not on file.   Social History Main Topics  . Smoking status: Former Smoker -- 2.00 packs/day for 30 years    Types: Cigarettes    Quit date: 12/03/1994  . Smokeless tobacco: Never Used  . Alcohol Use: No  . Drug Use: No  . Sexual Activity: No     Comment: lives with son and friend, no dietary restrictions   Other Topics Concern  . Not on file   Social History Narrative   epworth sleepiness scale = 11 (06/15/2015)    Family History  Problem Relation Age of Onset  . Stroke Mother     mini stroke  . Kidney disease Mother   . Heart failure Mother   . Hypertension Mother   . Diabetes Sister     type 2  . Hyperlipidemia Sister   . Heart attack Maternal Grandfather     ROS: no fevers or chills, productive cough, hemoptysis, dysphasia, odynophagia, melena, hematochezia, dysuria, hematuria, rash, seizure activity, orthopnea, PND, pedal edema, claudication. Remaining systems are negative.  Physical Exam:   Blood  pressure 120/62, pulse 70, height 5\' 4"  (1.626 m), weight 191 lb 6.4 oz (86.818 kg).  Deferred  EKG: Deferred  Impression: Patient Active Problem List   Diagnosis Date Noted  . Exudative age-related macular degeneration of right eye with active choroidal neovascularization 08/26/2015  . Right-sided carotid artery disease ([place]) 06/16/2015  . Left hip pain 05/23/2015  . Atypical chest pain 05/23/2015  . Medicare annual wellness visit, subsequent 02/21/2015  . Macular degeneration 09/28/2014  . Anemia 07/05/2014  . Essential and other specified forms of tremor 11/05/2013  . H. pylori infection 10/19/2013  . UTI (lower urinary tract  infection) 10/19/2013  . Hyperlipidemia, mixed 05/26/2013  . Pulmonary emboli ([place]) 05/26/2013  . Arthritis   . Chronic kidney disease   . Hypothyroidism   . CHF (congestive heart failure) ([place])   . Occlusion and stenosis of carotid artery without mention of cerebral infarction 12/17/2012  . Pain in limb 12/17/2012  . Numbness of finger 12/17/2012  . Constipation 12/17/2008  . Anxiety and depression 12/14/2008  . COPD (chronic obstructive pulmonary disease) ([place]) 12/14/2008  . GERD 12/14/2008  . OSA (obstructive sleep apnea) 12/14/2008   Plan: Emily Mitchell has no further recurrent chest pain. Her stress test was negative and although she has PAD, I do not think she has obstructive CAD as the cause of her chest pain. She has vascular surgery follow=up for that. She can follow up with me as needed for her heart.  Pixie Casino, MD, [facility] Attending Cardiologist [place]

## 2015-09-09 ENCOUNTER — Ambulatory Visit (INDEPENDENT_AMBULATORY_CARE_PROVIDER_SITE_OTHER): Payer: Commercial Managed Care - HMO | Admitting: Behavioral Health

## 2015-09-09 DIAGNOSIS — Z86711 Personal history of pulmonary embolism: Secondary | ICD-10-CM

## 2015-09-09 DIAGNOSIS — Z7901 Long term (current) use of anticoagulants: Secondary | ICD-10-CM

## 2015-09-09 LAB — POCT INR: INR: 2.8

## 2015-09-09 NOTE — Patient Instructions (Signed)
Per Dr.Blyth: Continue taking 1 tablet every day, except take 1/2 tablet on Monday, Thursday, and Saturday. Recheck INR in 4 weeks.  

## 2015-09-09 NOTE — Progress Notes (Signed)
Pre visit review using our clinic review tool, if applicable. No additional management support is needed unless otherwise documented below in the visit note. 

## 2015-09-13 ENCOUNTER — Ambulatory Visit (INDEPENDENT_AMBULATORY_CARE_PROVIDER_SITE_OTHER): Payer: Commercial Managed Care - HMO | Admitting: Family Medicine

## 2015-09-13 ENCOUNTER — Encounter: Payer: Self-pay | Admitting: Family Medicine

## 2015-09-13 VITALS — BP 128/82 | HR 71 | Temp 97.5°F | Ht 64.0 in | Wt 188.4 lb

## 2015-09-13 DIAGNOSIS — K219 Gastro-esophageal reflux disease without esophagitis: Secondary | ICD-10-CM | POA: Diagnosis not present

## 2015-09-13 DIAGNOSIS — E782 Mixed hyperlipidemia: Secondary | ICD-10-CM

## 2015-09-13 DIAGNOSIS — E785 Hyperlipidemia, unspecified: Secondary | ICD-10-CM

## 2015-09-13 DIAGNOSIS — D649 Anemia, unspecified: Secondary | ICD-10-CM

## 2015-09-13 DIAGNOSIS — N189 Chronic kidney disease, unspecified: Secondary | ICD-10-CM | POA: Diagnosis not present

## 2015-09-13 DIAGNOSIS — E039 Hypothyroidism, unspecified: Secondary | ICD-10-CM

## 2015-09-13 DIAGNOSIS — M545 Low back pain: Secondary | ICD-10-CM

## 2015-09-13 MED ORDER — CITALOPRAM HYDROBROMIDE 20 MG PO TABS: 20.0000 mg | ORAL_TABLET | Freq: Every day | ORAL | Status: DC

## 2015-09-13 MED ORDER — TELMISARTAN-HCTZ 80-12.5 MG PO TABS: 0.5000 | ORAL_TABLET | Freq: Every day | ORAL | Status: DC

## 2015-09-13 MED ORDER — ESOMEPRAZOLE MAGNESIUM 40 MG PO CPDR: 40.0000 mg | DELAYED_RELEASE_CAPSULE | Freq: Every day | ORAL | Status: DC

## 2015-09-13 MED ORDER — WARFARIN SODIUM 5 MG PO TABS: ORAL_TABLET | ORAL | Status: DC

## 2015-09-13 MED ORDER — MONTELUKAST SODIUM 10 MG PO TABS: 10.0000 mg | ORAL_TABLET | Freq: Every day | ORAL | Status: DC

## 2015-09-13 MED ORDER — EZETIMIBE 10 MG PO TABS: 10.0000 mg | ORAL_TABLET | Freq: Every day | ORAL | Status: DC

## 2015-09-13 MED ORDER — RANITIDINE HCL 300 MG PO TABS: 300.0000 mg | ORAL_TABLET | Freq: Every day | ORAL | Status: DC

## 2015-09-13 MED ORDER — CHOLINE FENOFIBRATE 135 MG PO CPDR: 135.0000 mg | DELAYED_RELEASE_CAPSULE | Freq: Every day | ORAL | Status: DC

## 2015-09-13 MED ORDER — CLONAZEPAM 1 MG PO TABS: 1.0000 mg | ORAL_TABLET | Freq: Three times a day (TID) | ORAL | Status: DC | PRN

## 2015-09-13 NOTE — Patient Instructions (Signed)
Salon Pas or Aspercreme antiinflammatory or lidocaine numbing patches daily  Basic Carbohydrate Counting for Diabetes Mellitus Carbohydrate counting is a method for keeping track of the amount of carbohydrates you eat. Eating carbohydrates naturally increases the level of sugar (glucose) in your blood, so it is important for you to know the amount that is okay for you to have in every meal. Carbohydrate counting helps keep the level of glucose in your blood within normal limits. The amount of carbohydrates allowed is different for every person. A dietitian can help you calculate the amount that is right for you. Once you know the amount of carbohydrates you can have, you can count the carbohydrates in the foods you want to eat. Carbohydrates are found in the following foods:  Grains, such as breads and cereals.  Dried beans and soy products.  Starchy vegetables, such as potatoes, peas, and corn.  Fruit and fruit juices.  Milk and yogurt.  Sweets and snack foods, such as cake, cookies, candy, chips, soft drinks, and fruit drinks. CARBOHYDRATE COUNTING There are two ways to count the carbohydrates in your food. You can use either of the methods or a combination of both. Reading the "Nutrition Facts" on [place] The "Nutrition Facts" is an area that is included on the labels of almost all packaged food and beverages in the [place]. It includes the serving size of that food or beverage and information about the nutrients in each serving of the food, including the grams (g) of carbohydrate per serving.  Decide the number of servings of this food or beverage that you will be able to eat or drink. Multiply that number of servings by the number of grams of carbohydrate that is listed on the label for that serving. The total will be the amount of carbohydrates you will be having when you eat or drink this food or beverage. Learning Standard Serving Sizes of Food When you eat food that is not  packaged or does not include "Nutrition Facts" on the label, you need to measure the servings in order to count the amount of carbohydrates.A serving of most carbohydrate-rich foods contains about 15 g of carbohydrates. The following list includes serving sizes of carbohydrate-rich foods that provide 15 g ofcarbohydrate per serving:   1 slice of bread (1 oz) or 1 six-inch tortilla.    of a hamburger bun or English muffin.  4-6 crackers.   cup unsweetened dry cereal.    cup hot cereal.   cup rice or pasta.    cup mashed potatoes or  of a large baked potato.  1 cup fresh fruit or one small piece of fruit.    cup canned or frozen fruit or fruit juice.  1 cup milk.   cup plain fat-free yogurt or yogurt sweetened with artificial sweeteners.   cup cooked dried beans or starchy vegetable, such as peas, corn, or potatoes.  Decide the number of standard-size servings that you will eat. Multiply that number of servings by 15 (the grams of carbohydrates in that serving). For example, if you eat 2 cups of strawberries, you will have eaten 2 servings and 30 g of carbohydrates (2 servings x 15 g = 30 g). For foods such as soups and casseroles, in which more than one food is mixed in, you will need to count the carbohydrates in each food that is included. EXAMPLE OF CARBOHYDRATE COUNTING Sample Dinner  3 oz chicken breast.   cup of brown rice.   cup of  corn.  1 cup milk.   1 cup strawberries with sugar-free whipped topping.  Carbohydrate Calculation Step 1: Identify the foods that contain carbohydrates:   Rice.   Corn.   Milk.   Strawberries. Step 2:Calculate the number of servings eaten of each:   2 servings of rice.   1 serving of corn.   1 serving of milk.   1 serving of strawberries. Step 3: Multiply each of those number of servings by 15 g:   2 servings of rice x 15 g = 30 g.   1 serving of corn x 15 g = 15 g.   1 serving of milk x 15  g = 15 g.   1 serving of strawberries x 15 g = 15 g. Step 4: Add together all of the amounts to find the total grams of carbohydrates eaten: 30 g + 15 g + 15 g + 15 g = 75 g.   This information is not intended to replace advice given to you by your health care provider. Make sure you discuss any questions you have with your health care provider.   Document Released: 07/17/2005 Document Revised: 08/07/2014 Document Reviewed: 06/13/2013 Elsevier Interactive Patient Education [employer].

## 2015-09-13 NOTE — Progress Notes (Signed)
Pre visit review using our clinic review tool, if applicable. No additional management support is needed unless otherwise documented below in the visit note. 

## 2015-09-14 ENCOUNTER — Telehealth: Payer: Self-pay | Admitting: *Deleted

## 2015-09-14 ENCOUNTER — Other Ambulatory Visit: Payer: Self-pay | Admitting: Family Medicine

## 2015-09-14 LAB — LIPID PANEL
CHOLESTEROL: 192 mg/dL (ref 0–200)
HDL: 51 mg/dL (ref >39.00)
NonHDL: 140.7
Total CHOL/HDL Ratio: 4
Triglycerides: 224 mg/dL — ABNORMAL HIGH (ref 0.0–149.0)
VLDL: 44.8 mg/dL — ABNORMAL HIGH (ref 0.0–40.0)

## 2015-09-14 LAB — CBC
HEMATOCRIT: 35.8 % — AB (ref 36.0–46.0)
Hemoglobin: 12.1 g/dL (ref 12.0–15.0)
MCHC: 33.7 g/dL (ref 30.0–36.0)
MCV: 88.8 fl (ref 78.0–100.0)
Platelets: 215 10*3/uL (ref 150.0–400.0)
RBC: 4.04 Mil/uL (ref 3.87–5.11)
RDW: 13.7 % (ref 11.5–15.5)
WBC: 5.7 10*3/uL (ref 4.0–10.5)

## 2015-09-14 LAB — COMPREHENSIVE METABOLIC PANEL
ALBUMIN: 4.4 g/dL (ref 3.5–5.2)
ALK PHOS: 38 U/L — AB (ref 39–117)
ALT: 10 U/L (ref 0–35)
AST: 16 U/L (ref 0–37)
BILIRUBIN TOTAL: 0.5 mg/dL (ref 0.2–1.2)
BUN: 34 mg/dL — AB (ref 6–23)
CO2: 27 mEq/L (ref 19–32)
CREATININE: 2.08 mg/dL — AB (ref 0.40–1.20)
Calcium: 9.6 mg/dL (ref 8.4–10.5)
Chloride: 105 mEq/L (ref 96–112)
GFR: 25.2 mL/min — ABNORMAL LOW (ref >60.00)
Glucose, Bld: 77 mg/dL (ref 70–99)
POTASSIUM: 4.4 meq/L (ref 3.5–5.1)
SODIUM: 140 meq/L (ref 135–145)
TOTAL PROTEIN: 7.2 g/dL (ref 6.0–8.3)

## 2015-09-14 LAB — HEMOGLOBIN A1C: Hgb A1c MFr Bld: 5.3 % (ref 4.6–6.5)

## 2015-09-14 LAB — TSH: TSH: 0.97 u[IU]/mL (ref 0.35–4.50)

## 2015-09-14 LAB — HEPATITIS C ANTIBODY: HCV AB: NEGATIVE

## 2015-09-14 LAB — LDL CHOLESTEROL, DIRECT: LDL DIRECT: 113 mg/dL

## 2015-09-14 NOTE — Telephone Encounter (Signed)
Received fax request for PA on Telmisartan-HCTZ, CMM [id number]/SLS 02/14

## 2015-09-15 ENCOUNTER — Other Ambulatory Visit: Payer: Self-pay | Admitting: General Practice

## 2015-09-15 MED ORDER — VALSARTAN 160 MG PO TABS: 160.0000 mg | ORAL_TABLET | Freq: Every day | ORAL | Status: DC

## 2015-09-15 NOTE — Telephone Encounter (Signed)
Per last lab results by Dr. B, medication was changed to valsartan

## 2015-09-15 NOTE — Telephone Encounter (Signed)
PA began in covermymeds 

## 2015-09-19 ENCOUNTER — Encounter: Payer: Self-pay | Admitting: Family Medicine

## 2015-09-19 DIAGNOSIS — M545 Low back pain, unspecified: Secondary | ICD-10-CM

## 2015-09-19 DIAGNOSIS — I5021 Acute systolic (congestive) heart failure: Secondary | ICD-10-CM | POA: Insufficient documentation

## 2015-09-19 HISTORY — DX: Low back pain, unspecified: M54.50

## 2015-09-19 NOTE — Progress Notes (Signed)
Patient ID: Emily Mitchell, female   DOB: 10/09/1945, 70 y.o.   MRN: [id number]   Subjective:    Patient ID: Emily Mitchell, female    DOB: 08-27-1945, 70 y.o.   MRN: [id number]  Chief Complaint  Patient presents with  . Follow-up    HPI Patient is in today for follow up. Is complaining of some back pain, lower. She was walking her dog which is only 15 # but he pulled on her and her back has hurt since then. No incontinence or radiculopathy. No other acute concerns. Denies CP/palp/SOB/HA/congestion/fevers/GI or GU c/o. Taking meds as prescribed  Past Medical History  Diagnosis Date  . Carotid artery occlusion   . Hyperlipidemia   . Hypertension   . COPD (chronic obstructive pulmonary disease) ([place])   . Chicken pox as a child  . Mumps 65 yrs old  . GERD (gastroesophageal reflux disease)   . Asthma   . Arthritis   . Hypothyroidism   . 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 Tremor   . CHF (congestive heart failure) ([place])   . Anxiety and depression 12/14/2008    Qualifier: Diagnosis of  By: [name] LPN, Almyra Free    . Pulmonary emboli ([place]) 05/26/2013  . H. pylori infection 10/19/2013  . UTI (lower urinary tract infection) 10/19/2013  . Anemia 07/05/2014  . Medicare annual wellness visit, subsequent 02/21/2015  . Left hip pain 05/23/2015  . Atypical chest pain 05/23/2015  . Low back pain 09/19/2015    Past Surgical History  Procedure Laterality Date  . Carotid endarterectomy Right 05/10/07    cea  . Knees replaced  2005 and 2011    both knees  . Wisdom tooth extraction    . Abdominal hysterectomy  1984  . Cataract extraction Left     Family History  Problem Relation Age of Onset  . Stroke Mother     mini stroke  . Kidney disease Mother   . Heart failure Mother   . Hypertension Mother   . Diabetes Sister     type 2  . Hyperlipidemia Sister   . Heart attack Maternal Grandfather     Social History   Social History  . Marital Status: Divorced   Spouse Name: N/A  . Number of Children: 2  . Years of Education: N/A   Occupational History  . Not on file.   Social History Main Topics  . Smoking status: Former Smoker -- 2.00 packs/day for 30 years    Types: Cigarettes    Quit date: 12/03/1994  . Smokeless tobacco: Never Used  . Alcohol Use: No  . Drug Use: No  . Sexual Activity: No     Comment: lives with son and friend, no dietary restrictions   Other Topics Concern  . Not on file   Social History Narrative   epworth sleepiness scale = 11 (06/15/2015)    Outpatient Prescriptions Prior to Visit  Medication Sig Dispense Refill  . acetaminophen (TYLENOL) 500 MG tablet Take 500 mg by mouth every 6 (six) hours as needed.    Marland Kitchen albuterol (PROVENTIL HFA;VENTOLIN HFA) 108 (90 BASE) MCG/ACT inhaler Inhale 2 puffs into the lungs every 4 (four) hours as needed for wheezing.    . calcium citrate-vitamin D (CITRACAL+D) 315-200 MG-UNIT per tablet Take 1 tablet by mouth daily.     . Cholecalciferol (D3 MAXIMUM STRENGTH) 5000 UNITS capsule Take 5,000 Units by mouth daily.    Marland Kitchen  Cyanocobalamin (VITAMIN B-12 PO) Take by mouth.    . levalbuterol (XOPENEX) 1.25 MG/0.5ML nebulizer solution Take 1.25 mg by nebulization every 4 (four) hours as needed for wheezing. 30 each 1  . levothyroxine (SYNTHROID, LEVOTHROID) 137 MCG tablet Take 1 tablet (137 mcg total) by mouth daily before breakfast. 90 tablet 1  . nitroGLYCERIN (NITROSTAT) 0.4 MG SL tablet Place 1 tablet (0.4 mg total) under the tongue every 5 (five) minutes as needed for chest pain. 25 tablet 3  . traMADol (ULTRAM) 50 MG tablet Take 1 tablet (50 mg total) by mouth every 8 (eight) hours as needed for moderate pain or severe pain. 90 tablet 0  . Choline Fenofibrate (TRILIPIX) 135 MG capsule Take 1 capsule (135 mg total) by mouth daily. 90 capsule 1  . citalopram (CELEXA) 20 MG tablet Take 1 tablet (20 mg total) by mouth daily. 90 tablet 1  . clonazePAM (KLONOPIN) 1 MG tablet TAKE ONE TABLET  BY MOUTH THREE TIMES DAILY AS NEEDED FOR ANXIETY. 90 tablet 0  . esomeprazole (NEXIUM) 40 MG capsule Take 1 capsule (40 mg total) by mouth daily at 12 noon. 90 capsule 1  . ezetimibe (ZETIA) 10 MG tablet Take 1 tablet (10 mg total) by mouth daily. 90 tablet 1  . montelukast (SINGULAIR) 10 MG tablet Take 1 tablet (10 mg total) by mouth at bedtime. Does not take Wed & Sun 90 tablet 1  . ranitidine (ZANTAC) 300 MG tablet Take 1 tablet (300 mg total) by mouth daily with breakfast. 90 tablet 1  . telmisartan-hydrochlorothiazide (MICARDIS HCT) 80-12.5 MG tablet Take 0.5 tablets by mouth daily. Dr. Servando Salina    . warfarin (COUMADIN) 5 MG tablet TAKE AS DIRECTED 90 tablet 0   No facility-administered medications prior to visit.    Allergies  Allergen Reactions  . Niaspan [Niacin Er] Nausea And Vomiting and Swelling    Swelling in mouth  . Pantoprazole     Mouth sores  . Bupropion Other (See Comments)    Uncontrollable shakes  . Penicillins Hives  . Statins   . Sulfonamide Derivatives Hives  . Apple Rash  . Banana Rash  . Milk-Related Compounds Rash    Review of Systems  Constitutional: Negative for fever and malaise/fatigue.  HENT: Negative for congestion.   Eyes: Negative for discharge.  Respiratory: Negative for shortness of breath.   Cardiovascular: Negative for chest pain, palpitations and leg swelling.  Gastrointestinal: Negative for nausea and abdominal pain.  Genitourinary: Negative for dysuria.  Musculoskeletal: Positive for back pain. Negative for falls.  Skin: Negative for rash.  Neurological: Negative for loss of consciousness and headaches.  Endo/Heme/Allergies: Negative for environmental allergies.  Psychiatric/Behavioral: Negative for depression. The patient is not nervous/anxious.        Objective:    Physical Exam  Constitutional: She is oriented to person, place, and time. She appears well-developed and well-nourished. No distress.  HENT:  Head: Normocephalic  and atraumatic.  Nose: Nose normal.  Eyes: Right eye exhibits no discharge. Left eye exhibits no discharge.  Neck: Normal range of motion. Neck supple.  Cardiovascular: Normal rate and regular rhythm.   No murmur heard. Pulmonary/Chest: Effort normal and breath sounds normal.  Abdominal: Soft. Bowel sounds are normal. There is no tenderness.  Musculoskeletal: She exhibits no edema.  Neurological: She is alert and oriented to person, place, and time.  Skin: Skin is warm and dry.  Psychiatric: She has a normal mood and affect.  Nursing note and vitals reviewed.  BP 128/82 mmHg  Pulse 71  Temp(Src) 97.5 F (36.4 C) (Oral)  Ht 5\' 4"  (1.626 m)  Wt 188 lb 6 oz (85.446 kg)  BMI 32.32 kg/m2  SpO2 99% Wt Readings from Last 3 Encounters:  09/13/15 188 lb 6 oz (85.446 kg)  09/02/15 191 lb 6.4 oz (86.818 kg)  07/06/15 193 lb (87.544 kg)     Lab Results  Component Value Date   WBC 5.7 09/13/2015   HGB 12.1 09/13/2015   HCT 35.8* 09/13/2015   PLT 215.0 09/13/2015   GLUCOSE 77 09/13/2015   CHOL 192 09/13/2015   TRIG 224.0* 09/13/2015   HDL 51.00 09/13/2015   LDLDIRECT 113.0 09/13/2015   LDLCALC 83 10/16/2013   ALT 10 09/13/2015   AST 16 09/13/2015   NA 140 09/13/2015   K 4.4 09/13/2015   CL 105 09/13/2015   CREATININE 2.08* 09/13/2015   BUN 34* 09/13/2015   CO2 27 09/13/2015   TSH 0.97 09/13/2015   INR 2.8 09/09/2015   HGBA1C 5.3 09/13/2015    Lab Results  Component Value Date   TSH 0.97 09/13/2015   Lab Results  Component Value Date   WBC 5.7 09/13/2015   HGB 12.1 09/13/2015   HCT 35.8* 09/13/2015   MCV 88.8 09/13/2015   PLT 215.0 09/13/2015   Lab Results  Component Value Date   NA 140 09/13/2015   K 4.4 09/13/2015   CO2 27 09/13/2015   GLUCOSE 77 09/13/2015   BUN 34* 09/13/2015   CREATININE 2.08* 09/13/2015   BILITOT 0.5 09/13/2015   ALKPHOS 38* 09/13/2015   AST 16 09/13/2015   ALT 10 09/13/2015   PROT 7.2 09/13/2015   ALBUMIN 4.4 09/13/2015    CALCIUM 9.6 09/13/2015   ANIONGAP 13 03/14/2014   GFR 25.20* 09/13/2015   Lab Results  Component Value Date   CHOL 192 09/13/2015   Lab Results  Component Value Date   HDL 51.00 09/13/2015   Lab Results  Component Value Date   LDLCALC 83 10/16/2013   Lab Results  Component Value Date   TRIG 224.0* 09/13/2015   Lab Results  Component Value Date   CHOLHDL 4 09/13/2015   Lab Results  Component Value Date   HGBA1C 5.3 09/13/2015       Assessment & Plan:   Problem List Items Addressed This Visit    Anemia   Relevant Orders   TSH (Completed)   CBC (Completed)   Comprehensive metabolic panel (Completed)   Lipid panel (Completed)   Hemoglobin A1c (Completed)   Hepatitis C antibody (Completed)   Chronic kidney disease    Follows with [facility]      Relevant Orders   TSH (Completed)   CBC (Completed)   Comprehensive metabolic panel (Completed)   Lipid panel (Completed)   Hemoglobin A1c (Completed)   Hepatitis C antibody (Completed)   GERD    Avoid offending foods, start probiotics. Do not eat large meals in late evening and consider raising head of bed. Continue Ranitidine prn      Relevant Medications   esomeprazole (NEXIUM) 40 MG capsule   ranitidine (ZANTAC) 300 MG tablet   Other Relevant Orders   TSH (Completed)   CBC (Completed)   Comprehensive metabolic panel (Completed)   Lipid panel (Completed)   Hemoglobin A1c (Completed)   Hepatitis C antibody (Completed)   Hyperlipidemia, mixed    Encouraged heart healthy diet, increase exercise, avoid trans fats, consider a krill oil cap daily  Relevant Medications   ezetimibe (ZETIA) 10 MG tablet   warfarin (COUMADIN) 5 MG tablet   Choline Fenofibrate (TRILIPIX) 135 MG capsule   Other Relevant Orders   TSH (Completed)   CBC (Completed)   Comprehensive metabolic panel (Completed)   Lipid panel (Completed)   Hemoglobin A1c (Completed)   Hepatitis C antibody (Completed)   Hypothyroidism    Relevant Orders   TSH (Completed)   CBC (Completed)   Comprehensive metabolic panel (Completed)   Lipid panel (Completed)   Hemoglobin A1c (Completed)   Hepatitis C antibody (Completed)   Low back pain    Encouraged moist heat and gentle stretching as tolerated. May try NSAIDs and prescription meds as directed and report if symptoms worsen or seek immediate care       Other Visit Diagnoses    Hyperlipidemia    -  Primary    Relevant Medications    ezetimibe (ZETIA) 10 MG tablet    warfarin (COUMADIN) 5 MG tablet    Choline Fenofibrate (TRILIPIX) 135 MG capsule    Other Relevant Orders    TSH (Completed)    CBC (Completed)    Comprehensive metabolic panel (Completed)    Lipid panel (Completed)    Hemoglobin A1c (Completed)    Hepatitis C antibody (Completed)       I have discontinued Ms. Gutierres telmisartan-hydrochlorothiazide. I have also changed her clonazePAM. Additionally, I am having her maintain her calcium citrate-vitamin D, Cholecalciferol, albuterol, nitroGLYCERIN, levalbuterol, Cyanocobalamin (VITAMIN B-12 PO), acetaminophen, traMADol, levothyroxine, citalopram, ezetimibe, esomeprazole, ranitidine, montelukast, warfarin, and Choline Fenofibrate.  Meds ordered this encounter  Medications  . citalopram (CELEXA) 20 MG tablet    Sig: Take 1 tablet (20 mg total) by mouth daily.    Dispense:  90 tablet    Refill:  1  . ezetimibe (ZETIA) 10 MG tablet    Sig: Take 1 tablet (10 mg total) by mouth daily.    Dispense:  90 tablet    Refill:  1  . esomeprazole (NEXIUM) 40 MG capsule    Sig: Take 1 capsule (40 mg total) by mouth daily at 12 noon.    Dispense:  90 capsule    Refill:  1  . ranitidine (ZANTAC) 300 MG tablet    Sig: Take 1 tablet (300 mg total) by mouth daily with breakfast.    Dispense:  90 tablet    Refill:  1  . montelukast (SINGULAIR) 10 MG tablet    Sig: Take 1 tablet (10 mg total) by mouth at bedtime. Does not take Wed & Sun    Dispense:  90 tablet      Refill:  1  . warfarin (COUMADIN) 5 MG tablet    Sig: TAKE AS DIRECTED    Dispense:  90 tablet    Refill:  1  . Choline Fenofibrate (TRILIPIX) 135 MG capsule    Sig: Take 1 capsule (135 mg total) by mouth daily.    Dispense:  90 capsule    Refill:  1  . DISCONTD: telmisartan-hydrochlorothiazide (MICARDIS HCT) 80-12.5 MG tablet    Sig: Take 0.5 tablets by mouth daily.    Dispense:  45 tablet    Refill:  1  . clonazePAM (KLONOPIN) 1 MG tablet    Sig: Take 1 tablet (1 mg total) by mouth 3 (three) times daily as needed for anxiety.    Dispense:  90 tablet    Refill:  3     Penni Homans, MD

## 2015-09-19 NOTE — Assessment & Plan Note (Signed)
Follows with [facility]

## 2015-09-19 NOTE — Assessment & Plan Note (Signed)
Encouraged moist heat and gentle stretching as tolerated. May try NSAIDs and prescription meds as directed and report if symptoms worsen or seek immediate care 

## 2015-09-19 NOTE — Assessment & Plan Note (Signed)
Encouraged heart healthy diet, increase exercise, avoid trans fats, consider a krill oil cap daily 

## 2015-09-19 NOTE — Assessment & Plan Note (Signed)
Avoid offending foods, start probiotics. Do not eat large meals in late evening and consider raising head of bed. Continue Ranitidine prn

## 2015-10-07 ENCOUNTER — Ambulatory Visit (INDEPENDENT_AMBULATORY_CARE_PROVIDER_SITE_OTHER): Payer: Commercial Managed Care - HMO | Admitting: *Deleted

## 2015-10-07 DIAGNOSIS — I2699 Other pulmonary embolism without acute cor pulmonale: Secondary | ICD-10-CM

## 2015-10-07 LAB — POCT INR: INR: 2.5

## 2015-10-07 NOTE — Progress Notes (Signed)
Pre visit review using our clinic review tool, if applicable. No additional management support is needed unless otherwise documented below in the visit note.  INR today 2.5.   Continue taking 1 tablet every day, except take 1/2 tablet on Monday, Thursday, and Saturday. Recheck INR in 4 weeks.  Pt verbalized understanding.   Next appt: 11/04/15  Dorrene German, RN

## 2015-10-07 NOTE — Patient Instructions (Signed)
Continue taking 1 tablet every day, except take 1/2 tablet on Monday, Thursday, and Saturday. Recheck INR in 4 weeks. 

## 2015-10-18 ENCOUNTER — Ambulatory Visit (INDEPENDENT_AMBULATORY_CARE_PROVIDER_SITE_OTHER): Payer: Commercial Managed Care - HMO | Admitting: Family Medicine

## 2015-10-18 ENCOUNTER — Encounter: Payer: Self-pay | Admitting: Family Medicine

## 2015-10-18 VITALS — BP 138/92 | HR 77 | Temp 97.9°F | Ht 64.0 in | Wt 188.0 lb

## 2015-10-18 DIAGNOSIS — E039 Hypothyroidism, unspecified: Secondary | ICD-10-CM

## 2015-10-18 DIAGNOSIS — I10 Essential (primary) hypertension: Secondary | ICD-10-CM

## 2015-10-18 DIAGNOSIS — F418 Other specified anxiety disorders: Secondary | ICD-10-CM

## 2015-10-18 DIAGNOSIS — F329 Major depressive disorder, single episode, unspecified: Secondary | ICD-10-CM

## 2015-10-18 DIAGNOSIS — K219 Gastro-esophageal reflux disease without esophagitis: Secondary | ICD-10-CM

## 2015-10-18 DIAGNOSIS — F32A Depression, unspecified: Secondary | ICD-10-CM

## 2015-10-18 DIAGNOSIS — F419 Anxiety disorder, unspecified: Secondary | ICD-10-CM

## 2015-10-18 DIAGNOSIS — N189 Chronic kidney disease, unspecified: Secondary | ICD-10-CM

## 2015-10-18 HISTORY — DX: Essential (primary) hypertension: I10

## 2015-10-18 MED ORDER — CITALOPRAM HYDROBROMIDE 20 MG PO TABS: 40.0000 mg | ORAL_TABLET | Freq: Every day | ORAL | Status: DC

## 2015-10-18 MED ORDER — METOPROLOL SUCCINATE ER 25 MG PO TB24: 25.0000 mg | ORAL_TABLET | Freq: Every day | ORAL | Status: DC

## 2015-10-18 NOTE — Assessment & Plan Note (Signed)
Avoid offending foods, start probiotics. Do not eat large meals in late evening and consider raising head of bed.  

## 2015-10-18 NOTE — Assessment & Plan Note (Signed)
On Levothyroxine, continue to monitor 

## 2015-10-18 NOTE — Assessment & Plan Note (Signed)
Is struggling with the stress of her 68 year old granddaughter whom she raised moving back with her own mother, the patient is estranged from her daughter so she is very upset. Increase Citalopram 40 mg daily and referred for counseling

## 2015-10-18 NOTE — Patient Instructions (Signed)
Hypertension Hypertension, commonly called high blood pressure, is when the force of blood pumping through your arteries is too strong. Your arteries are the blood vessels that carry blood from your heart throughout your body. A blood pressure reading consists of a higher number over a lower number, such as 110/72. The higher number (systolic) is the pressure inside your arteries when your heart pumps. The lower number (diastolic) is the pressure inside your arteries when your heart relaxes. Ideally you want your blood pressure below 120/80. Hypertension forces your heart to work harder to pump blood. Your arteries may become narrow or stiff. Having untreated or uncontrolled hypertension can cause heart attack, stroke, kidney disease, and other problems. RISK FACTORS Some risk factors for high blood pressure are controllable. Others are not.  Risk factors you cannot control include:   Race. You may be at higher risk if you are African American.  Age. Risk increases with age.  Gender. Men are at higher risk than women before age 45 years. After age 65, women are at higher risk than men. Risk factors you can control include:  Not getting enough exercise or physical activity.  Being overweight.  Getting too much fat, sugar, calories, or salt in your diet.  Drinking too much alcohol. SIGNS AND SYMPTOMS Hypertension does not usually cause signs or symptoms. Extremely high blood pressure (hypertensive crisis) may cause headache, anxiety, shortness of breath, and nosebleed. DIAGNOSIS To check if you have hypertension, your health care provider will measure your blood pressure while you are seated, with your arm held at the level of your heart. It should be measured at least twice using the same arm. Certain conditions can cause a difference in blood pressure between your right and left arms. A blood pressure reading that is higher than normal on one occasion does not mean that you need treatment. If  it is not clear whether you have high blood pressure, you may be asked to return on a different day to have your blood pressure checked again. Or, you may be asked to monitor your blood pressure at home for 1 or more weeks. TREATMENT Treating high blood pressure includes making lifestyle changes and possibly taking medicine. Living a healthy lifestyle can help lower high blood pressure. You may need to change some of your habits. Lifestyle changes may include:  Following the DASH diet. This diet is high in fruits, vegetables, and whole grains. It is low in salt, red meat, and added sugars.  Keep your sodium intake below 2,300 mg per day.  Getting at least 30-45 minutes of aerobic exercise at least 4 times per week.  Losing weight if necessary.  Not smoking.  Limiting alcoholic beverages.  Learning ways to reduce stress. Your health care provider may prescribe medicine if lifestyle changes are not enough to get your blood pressure under control, and if one of the following is true:  You are 18-59 years of age and your systolic blood pressure is above 140.  You are 60 years of age or older, and your systolic blood pressure is above 150.  Your diastolic blood pressure is above 90.  You have diabetes, and your systolic blood pressure is over 140 or your diastolic blood pressure is over 90.  You have kidney disease and your blood pressure is above 140/90.  You have heart disease and your blood pressure is above 140/90. Your personal target blood pressure may vary depending on your medical conditions, your age, and other factors. HOME CARE INSTRUCTIONS    Have your blood pressure rechecked as directed by your health care provider.   Take medicines only as directed by your health care provider. Follow the directions carefully. Blood pressure medicines must be taken as prescribed. The medicine does not work as well when you skip doses. Skipping doses also puts you at risk for  problems.  Do not smoke.   Monitor your blood pressure at home as directed by your health care provider. SEEK MEDICAL CARE IF:   You think you are having a reaction to medicines taken.  You have recurrent headaches or feel dizzy.  You have swelling in your ankles.  You have trouble with your vision. SEEK IMMEDIATE MEDICAL CARE IF:  You develop a severe headache or confusion.  You have unusual weakness, numbness, or feel faint.  You have severe chest or abdominal pain.  You vomit repeatedly.  You have trouble breathing. MAKE SURE YOU:   Understand these instructions.  Will watch your condition.  Will get help right away if you are not doing well or get worse.   This information is not intended to replace advice given to you by your health care provider. Make sure you discuss any questions you have with your health care provider.   Document Released: 07/17/2005 Document Revised: 12/01/2014 Document Reviewed: 05/09/2013 Elsevier Interactive Patient Education 2016 [employer]  

## 2015-10-18 NOTE — Assessment & Plan Note (Signed)
poorlycontrolled and under a great deal of stress, add Metoprolol XL 25 mg daily. Encouraged heart healthy diet such as the DASH diet and exercise as tolerated.

## 2015-10-18 NOTE — Progress Notes (Signed)
Subjective:    Patient ID: Emily Mitchell, female    DOB: 1946/01/12, 70 y.o.   MRN: [id number]  Chief Complaint  Patient presents with  . Hypertension    HPI Patient is in today for follow up. She is very tearful today because her 69 year old granddaughter whom she has raised is going back to live with the patient's estranged daughter. She endorses fatigue, anhedonia, myalgias but denies suicidal ideation. Denies CP/palp/SOB/congestion/fevers/GI or GU c/o. Taking meds as prescribed  Past Medical History  Diagnosis Date  . Carotid artery occlusion   . Hyperlipidemia   . Hypertension   . COPD (chronic obstructive pulmonary disease) ([place])   . Chicken pox as a child  . Mumps 62 yrs old  . GERD (gastroesophageal reflux disease)   . Asthma   . Arthritis   . Hypothyroidism   . Chronic kidney disease     Bright's Disease at age 17   . Tremor   . CHF (congestive heart failure) ([place])   . Anxiety and depression 12/14/2008    Qualifier: Diagnosis of  By: [name] LPN, Almyra Free    . Pulmonary emboli ([place]) 05/26/2013  . H. pylori infection 10/19/2013  . UTI (lower urinary tract infection) 10/19/2013  . Anemia 07/05/2014  . Medicare annual wellness visit, subsequent 02/21/2015  . Left hip pain 05/23/2015  . Atypical chest pain 05/23/2015  . Low back pain 09/19/2015    Past Surgical History  Procedure Laterality Date  . Carotid endarterectomy Right 05/10/07    cea  . Knees replaced  2005 and 2011    both knees  . Wisdom tooth extraction    . Abdominal hysterectomy  1984  . Cataract extraction Left     Family History  Problem Relation Age of Onset  . Stroke Mother     mini stroke  . Kidney disease Mother   . Heart failure Mother   . Hypertension Mother   . Diabetes Sister     type 2  . Hyperlipidemia Sister   . Heart attack Maternal Grandfather     Social History   Social History  . Marital Status: Divorced    Spouse Name: N/A  . Number of Children: 2  . Years of  Education: N/A   Occupational History  . Not on file.   Social History Main Topics  . Smoking status: Former Smoker -- 2.00 packs/day for 30 years    Types: Cigarettes    Quit date: 12/03/1994  . Smokeless tobacco: Never Used  . Alcohol Use: No  . Drug Use: No  . Sexual Activity: No     Comment: lives with son and friend, no dietary restrictions   Other Topics Concern  . Not on file   Social History Narrative   epworth sleepiness scale = 11 (06/15/2015)    Outpatient Prescriptions Prior to Visit  Medication Sig Dispense Refill  . acetaminophen (TYLENOL) 500 MG tablet Take 500 mg by mouth every 6 (six) hours as needed.    Marland Kitchen albuterol (PROVENTIL HFA;VENTOLIN HFA) 108 (90 BASE) MCG/ACT inhaler Inhale 2 puffs into the lungs every 4 (four) hours as needed for wheezing.    . calcium citrate-vitamin D (CITRACAL+D) 315-200 MG-UNIT per tablet Take 1 tablet by mouth daily.     . Cholecalciferol (D3 MAXIMUM STRENGTH) 5000 UNITS capsule Take 5,000 Units by mouth daily.    . Choline Fenofibrate (TRILIPIX) 135 MG capsule Take 1 capsule (135 mg total) by mouth daily. 90 capsule 1  .  clonazePAM (KLONOPIN) 1 MG tablet Take 1 tablet (1 mg total) by mouth 3 (three) times daily as needed for anxiety. 90 tablet 3  . Cyanocobalamin (VITAMIN B-12 PO) Take by mouth.    . esomeprazole (NEXIUM) 40 MG capsule Take 1 capsule (40 mg total) by mouth daily at 12 noon. 90 capsule 1  . ezetimibe (ZETIA) 10 MG tablet Take 1 tablet (10 mg total) by mouth daily. 90 tablet 1  . levalbuterol (XOPENEX) 1.25 MG/0.5ML nebulizer solution Take 1.25 mg by nebulization every 4 (four) hours as needed for wheezing. 30 each 1  . levothyroxine (SYNTHROID, LEVOTHROID) 137 MCG tablet Take 1 tablet (137 mcg total) by mouth daily before breakfast. 90 tablet 1  . montelukast (SINGULAIR) 10 MG tablet Take 1 tablet (10 mg total) by mouth at bedtime. Does not take Wed & Sun 90 tablet 1  . nitroGLYCERIN (NITROSTAT) 0.4 MG SL tablet Place  1 tablet (0.4 mg total) under the tongue every 5 (five) minutes as needed for chest pain. 25 tablet 3  . ranitidine (ZANTAC) 300 MG tablet Take 1 tablet (300 mg total) by mouth daily with breakfast. 90 tablet 1  . traMADol (ULTRAM) 50 MG tablet Take 1 tablet (50 mg total) by mouth every 8 (eight) hours as needed for moderate pain or severe pain. 90 tablet 0  . valsartan (DIOVAN) 160 MG tablet Take 1 tablet (160 mg total) by mouth daily. 30 tablet 1  . warfarin (COUMADIN) 5 MG tablet TAKE AS DIRECTED 90 tablet 1  . citalopram (CELEXA) 20 MG tablet Take 1 tablet (20 mg total) by mouth daily. 90 tablet 1   No facility-administered medications prior to visit.    Allergies  Allergen Reactions  . Niaspan [Niacin Er] Nausea And Vomiting and Swelling    Swelling in mouth  . Pantoprazole     Mouth sores  . Bupropion Other (See Comments)    Uncontrollable shakes  . Penicillins Hives  . Statins   . Sulfonamide Derivatives Hives  . Apple Rash  . Banana Rash  . Milk-Related Compounds Rash    Review of Systems  Constitutional: Negative for fever and malaise/fatigue.  HENT: Negative for congestion.   Eyes: Negative for blurred vision.  Respiratory: Negative for shortness of breath.   Cardiovascular: Negative for chest pain, palpitations and leg swelling.  Gastrointestinal: Negative for nausea, abdominal pain and blood in stool.  Genitourinary: Negative for dysuria and frequency.  Musculoskeletal: Negative for falls.  Skin: Negative for rash.  Neurological: Positive for tremors and headaches. Negative for dizziness and loss of consciousness.  Endo/Heme/Allergies: Negative for environmental allergies.  Psychiatric/Behavioral: Positive for depression. The patient is nervous/anxious.        Objective:    Physical Exam  Constitutional: She is oriented to person, place, and time. She appears well-developed and well-nourished. No distress.  HENT:  Head: Normocephalic and atraumatic.  Nose:  Nose normal.  Eyes: Right eye exhibits no discharge. Left eye exhibits no discharge.  Neck: Normal range of motion. Neck supple.  Cardiovascular: Normal rate and regular rhythm.   No murmur heard. Pulmonary/Chest: Effort normal and breath sounds normal.  Abdominal: Soft. Bowel sounds are normal. There is no tenderness.  Musculoskeletal: She exhibits no edema.  Neurological: She is alert and oriented to person, place, and time.  Skin: Skin is warm and dry.  Psychiatric: She has a normal mood and affect.  Nursing note and vitals reviewed.   BP 138/92 mmHg  Pulse 77  Temp(Src) 97.9  F (36.6 C) (Oral)  Ht 5\' 4"  (1.626 m)  Wt 188 lb (85.276 kg)  BMI 32.25 kg/m2  SpO2 97% Wt Readings from Last 3 Encounters:  10/18/15 188 lb (85.276 kg)  09/13/15 188 lb 6 oz (85.446 kg)  09/02/15 191 lb 6.4 oz (86.818 kg)     Lab Results  Component Value Date   WBC 5.7 09/13/2015   HGB 12.1 09/13/2015   HCT 35.8* 09/13/2015   PLT 215.0 09/13/2015   GLUCOSE 77 09/13/2015   CHOL 192 09/13/2015   TRIG 224.0* 09/13/2015   HDL 51.00 09/13/2015   LDLDIRECT 113.0 09/13/2015   LDLCALC 83 10/16/2013   ALT 10 09/13/2015   AST 16 09/13/2015   NA 140 09/13/2015   K 4.4 09/13/2015   CL 105 09/13/2015   CREATININE 2.08* 09/13/2015   BUN 34* 09/13/2015   CO2 27 09/13/2015   TSH 0.97 09/13/2015   INR 2.5 10/07/2015   HGBA1C 5.3 09/13/2015    Lab Results  Component Value Date   TSH 0.97 09/13/2015   Lab Results  Component Value Date   WBC 5.7 09/13/2015   HGB 12.1 09/13/2015   HCT 35.8* 09/13/2015   MCV 88.8 09/13/2015   PLT 215.0 09/13/2015   Lab Results  Component Value Date   NA 140 09/13/2015   K 4.4 09/13/2015   CO2 27 09/13/2015   GLUCOSE 77 09/13/2015   BUN 34* 09/13/2015   CREATININE 2.08* 09/13/2015   BILITOT 0.5 09/13/2015   ALKPHOS 38* 09/13/2015   AST 16 09/13/2015   ALT 10 09/13/2015   PROT 7.2 09/13/2015   ALBUMIN 4.4 09/13/2015   CALCIUM 9.6 09/13/2015    ANIONGAP 13 03/14/2014   GFR 25.20* 09/13/2015   Lab Results  Component Value Date   CHOL 192 09/13/2015   Lab Results  Component Value Date   HDL 51.00 09/13/2015   Lab Results  Component Value Date   LDLCALC 83 10/16/2013   Lab Results  Component Value Date   TRIG 224.0* 09/13/2015   Lab Results  Component Value Date   CHOLHDL 4 09/13/2015   Lab Results  Component Value Date   HGBA1C 5.3 09/13/2015       Assessment & Plan:   Problem List Items Addressed This Visit    Anxiety and depression    Is struggling with the stress of her 72 year old granddaughter whom she raised moving back with her own mother, the patient is estranged from her daughter so she is very upset. Increase Citalopram 40 mg daily and referred for counseling      Chronic kidney disease    Continues with [facility]      Essential hypertension - Primary    poorlycontrolled and under a great deal of stress, add Metoprolol XL 25 mg daily. Encouraged heart healthy diet such as the DASH diet and exercise as tolerated.       Relevant Medications   metoprolol succinate (TOPROL-XL) 25 MG 24 hr tablet   GERD    Avoid offending foods, start probiotics. Do not eat large meals in late evening and consider raising head of bed.       Hypothyroidism    On Levothyroxine, continue to monitor      Relevant Medications   metoprolol succinate (TOPROL-XL) 25 MG 24 hr tablet      I have changed Ms. Calderone citalopram. I am also having her start on metoprolol succinate. Additionally, I am having her maintain her calcium citrate-vitamin  D, Cholecalciferol, albuterol, nitroGLYCERIN, levalbuterol, Cyanocobalamin (VITAMIN B-12 PO), acetaminophen, traMADol, levothyroxine, ezetimibe, esomeprazole, ranitidine, montelukast, warfarin, Choline Fenofibrate, clonazePAM, and valsartan.  Meds ordered this encounter  Medications  . citalopram (CELEXA) 20 MG tablet    Sig: Take 2 tablets (40 mg total) by mouth daily.      Dispense:  180 tablet    Refill:  1  . metoprolol succinate (TOPROL-XL) 25 MG 24 hr tablet    Sig: Take 1 tablet (25 mg total) by mouth daily.    Dispense:  90 tablet    Refill:  1     Penni Homans, MD

## 2015-10-18 NOTE — Assessment & Plan Note (Signed)
Continues with [facility]

## 2015-10-18 NOTE — Progress Notes (Signed)
Pre visit review using our clinic review tool, if applicable. No additional management support is needed unless otherwise documented below in the visit note. 

## 2015-11-04 ENCOUNTER — Ambulatory Visit (INDEPENDENT_AMBULATORY_CARE_PROVIDER_SITE_OTHER): Payer: Commercial Managed Care - HMO | Admitting: *Deleted

## 2015-11-04 DIAGNOSIS — I2699 Other pulmonary embolism without acute cor pulmonale: Secondary | ICD-10-CM

## 2015-11-04 LAB — POCT INR: INR: 2.3

## 2015-11-04 NOTE — Progress Notes (Signed)
Pre visit review using our clinic review tool, if applicable. No additional management support is needed unless otherwise documented below in the visit note.  INR today 2.3.  Continue taking 1 tablet every day, except take 1/2 tablet on Monday, Thursday, and Saturday. Recheck INR in 4 weeks.  Next appt: 12/02/15  Dorrene German, RN

## 2015-11-04 NOTE — Patient Instructions (Signed)
Continue taking 1 tablet every day, except take 1/2 tablet on Monday, Thursday, and Saturday. Recheck INR in 4 weeks. 

## 2015-11-29 ENCOUNTER — Other Ambulatory Visit: Payer: Self-pay | Admitting: Family Medicine

## 2015-11-30 DIAGNOSIS — M1990 Unspecified osteoarthritis, unspecified site: Secondary | ICD-10-CM | POA: Insufficient documentation

## 2015-11-30 DIAGNOSIS — Z961 Presence of intraocular lens: Secondary | ICD-10-CM | POA: Insufficient documentation

## 2015-11-30 DIAGNOSIS — H04123 Dry eye syndrome of bilateral lacrimal glands: Secondary | ICD-10-CM | POA: Insufficient documentation

## 2015-11-30 DIAGNOSIS — I509 Heart failure, unspecified: Secondary | ICD-10-CM

## 2015-11-30 DIAGNOSIS — N189 Chronic kidney disease, unspecified: Secondary | ICD-10-CM | POA: Insufficient documentation

## 2015-11-30 HISTORY — DX: Heart failure, unspecified: I50.9

## 2015-11-30 HISTORY — DX: Dry eye syndrome of bilateral lacrimal glands: H04.123

## 2015-12-02 ENCOUNTER — Ambulatory Visit (INDEPENDENT_AMBULATORY_CARE_PROVIDER_SITE_OTHER): Payer: Commercial Managed Care - HMO | Admitting: *Deleted

## 2015-12-02 DIAGNOSIS — I2699 Other pulmonary embolism without acute cor pulmonale: Secondary | ICD-10-CM | POA: Diagnosis not present

## 2015-12-02 LAB — POCT INR: INR: 2.1

## 2015-12-02 NOTE — Progress Notes (Signed)
Pre visit review using our clinic review tool, if applicable. No additional management support is needed unless otherwise documented below in the visit note.  INR today 2.1. Pt findings negative.  Continue taking 1 tablet every day, except take 1/2 tablet on Monday, Thursday, and Saturday. Recheck INR in 4 weeks.  Next appt: 12/30/15  Dorrene German, RN

## 2015-12-02 NOTE — Patient Instructions (Signed)
Continue taking 1 tablet every day, except take 1/2 tablet on Monday, Thursday, and Saturday. Recheck INR in 4 weeks. 

## 2015-12-02 NOTE — Progress Notes (Signed)
RN note reviewed. Agree with documention and plan. 

## 2015-12-16 ENCOUNTER — Ambulatory Visit: Payer: Commercial Managed Care - HMO | Admitting: Family Medicine

## 2015-12-16 ENCOUNTER — Ambulatory Visit (INDEPENDENT_AMBULATORY_CARE_PROVIDER_SITE_OTHER): Payer: Commercial Managed Care - HMO | Admitting: Family Medicine

## 2015-12-16 ENCOUNTER — Encounter: Payer: Self-pay | Admitting: Family Medicine

## 2015-12-16 VITALS — BP 128/78 | HR 68 | Temp 98.1°F | Ht 64.0 in | Wt 185.4 lb

## 2015-12-16 DIAGNOSIS — I10 Essential (primary) hypertension: Secondary | ICD-10-CM | POA: Diagnosis not present

## 2015-12-16 DIAGNOSIS — F418 Other specified anxiety disorders: Secondary | ICD-10-CM

## 2015-12-16 DIAGNOSIS — H3530 Unspecified macular degeneration: Secondary | ICD-10-CM | POA: Insufficient documentation

## 2015-12-16 DIAGNOSIS — J438 Other emphysema: Secondary | ICD-10-CM

## 2015-12-16 DIAGNOSIS — E782 Mixed hyperlipidemia: Secondary | ICD-10-CM

## 2015-12-16 DIAGNOSIS — F329 Major depressive disorder, single episode, unspecified: Secondary | ICD-10-CM

## 2015-12-16 DIAGNOSIS — K219 Gastro-esophageal reflux disease without esophagitis: Secondary | ICD-10-CM

## 2015-12-16 DIAGNOSIS — F32A Depression, unspecified: Secondary | ICD-10-CM

## 2015-12-16 DIAGNOSIS — G4733 Obstructive sleep apnea (adult) (pediatric): Secondary | ICD-10-CM

## 2015-12-16 DIAGNOSIS — F419 Anxiety disorder, unspecified: Secondary | ICD-10-CM

## 2015-12-16 HISTORY — DX: Unspecified macular degeneration: H35.30

## 2015-12-16 MED ORDER — TRAMADOL HCL 50 MG PO TABS: 50.0000 mg | ORAL_TABLET | Freq: Three times a day (TID) | ORAL | Status: DC | PRN

## 2015-12-16 NOTE — Patient Instructions (Signed)
Hypertension Hypertension, commonly called high blood pressure, is when the force of blood pumping through your arteries is too strong. Your arteries are the blood vessels that carry blood from your heart throughout your body. A blood pressure reading consists of a higher number over a lower number, such as 110/72. The higher number (systolic) is the pressure inside your arteries when your heart pumps. The lower number (diastolic) is the pressure inside your arteries when your heart relaxes. Ideally you want your blood pressure below 120/80. Hypertension forces your heart to work harder to pump blood. Your arteries may become narrow or stiff. Having untreated or uncontrolled hypertension can cause heart attack, stroke, kidney disease, and other problems. RISK FACTORS Some risk factors for high blood pressure are controllable. Others are not.  Risk factors you cannot control include:   Race. You may be at higher risk if you are African American.  Age. Risk increases with age.  Gender. Men are at higher risk than women before age 45 years. After age 65, women are at higher risk than men. Risk factors you can control include:  Not getting enough exercise or physical activity.  Being overweight.  Getting too much fat, sugar, calories, or salt in your diet.  Drinking too much alcohol. SIGNS AND SYMPTOMS Hypertension does not usually cause signs or symptoms. Extremely high blood pressure (hypertensive crisis) may cause headache, anxiety, shortness of breath, and nosebleed. DIAGNOSIS To check if you have hypertension, your health care provider will measure your blood pressure while you are seated, with your arm held at the level of your heart. It should be measured at least twice using the same arm. Certain conditions can cause a difference in blood pressure between your right and left arms. A blood pressure reading that is higher than normal on one occasion does not mean that you need treatment. If  it is not clear whether you have high blood pressure, you may be asked to return on a different day to have your blood pressure checked again. Or, you may be asked to monitor your blood pressure at home for 1 or more weeks. TREATMENT Treating high blood pressure includes making lifestyle changes and possibly taking medicine. Living a healthy lifestyle can help lower high blood pressure. You may need to change some of your habits. Lifestyle changes may include:  Following the DASH diet. This diet is high in fruits, vegetables, and whole grains. It is low in salt, red meat, and added sugars.  Keep your sodium intake below 2,300 mg per day.  Getting at least 30-45 minutes of aerobic exercise at least 4 times per week.  Losing weight if necessary.  Not smoking.  Limiting alcoholic beverages.  Learning ways to reduce stress. Your health care provider may prescribe medicine if lifestyle changes are not enough to get your blood pressure under control, and if one of the following is true:  You are 18-59 years of age and your systolic blood pressure is above 140.  You are 60 years of age or older, and your systolic blood pressure is above 150.  Your diastolic blood pressure is above 90.  You have diabetes, and your systolic blood pressure is over 140 or your diastolic blood pressure is over 90.  You have kidney disease and your blood pressure is above 140/90.  You have heart disease and your blood pressure is above 140/90. Your personal target blood pressure may vary depending on your medical conditions, your age, and other factors. HOME CARE INSTRUCTIONS    Have your blood pressure rechecked as directed by your health care provider.   Take medicines only as directed by your health care provider. Follow the directions carefully. Blood pressure medicines must be taken as prescribed. The medicine does not work as well when you skip doses. Skipping doses also puts you at risk for  problems.  Do not smoke.   Monitor your blood pressure at home as directed by your health care provider. SEEK MEDICAL CARE IF:   You think you are having a reaction to medicines taken.  You have recurrent headaches or feel dizzy.  You have swelling in your ankles.  You have trouble with your vision. SEEK IMMEDIATE MEDICAL CARE IF:  You develop a severe headache or confusion.  You have unusual weakness, numbness, or feel faint.  You have severe chest or abdominal pain.  You vomit repeatedly.  You have trouble breathing. MAKE SURE YOU:   Understand these instructions.  Will watch your condition.  Will get help right away if you are not doing well or get worse.   This information is not intended to replace advice given to you by your health care provider. Make sure you discuss any questions you have with your health care provider.   Document Released: 07/17/2005 Document Revised: 12/01/2014 Document Reviewed: 05/09/2013 Elsevier Interactive Patient Education 2016 [employer]  

## 2015-12-16 NOTE — Assessment & Plan Note (Signed)
Citalopram increased to 40 mg and is doing better. Using Clonazepam only once a day and is doing well. Her granddaughter is moving out June 9 and that will also help

## 2015-12-16 NOTE — Assessment & Plan Note (Signed)
Avoid offending foods, start probiotics. Do not eat large meals in late evening and consider raising head of bed.  

## 2015-12-16 NOTE — Assessment & Plan Note (Signed)
Well controlled, no changes to meds. Encouraged heart healthy diet such as the DASH diet and exercise as tolerated. She stopped her Metoprolol accidentally and has felt fine so she did not restart it. Will hold off ofr now since her numbers are good.

## 2015-12-16 NOTE — Assessment & Plan Note (Signed)
Right eye is wet Left Eye is dry Shots every 11 to 12 weeks in right eye at opthamologist office Is doing well

## 2015-12-16 NOTE — Assessment & Plan Note (Signed)
- 

## 2015-12-16 NOTE — Assessment & Plan Note (Signed)
Sees pulmonology next month is doing well

## 2015-12-16 NOTE — Progress Notes (Deleted)
Subjective:    Patient ID: Emily Mitchell, female    DOB: 1945/08/03, 70 y.o.   MRN: [id number]  Chief Complaint  Patient presents with  . Follow-up    HPI Patient is in today for follow up.    Past Medical History  Diagnosis Date  . Carotid artery occlusion   . Hyperlipidemia   . Hypertension   . COPD (chronic obstructive pulmonary disease) ([place])   . Chicken pox as a child  . Mumps 11 yrs old  . GERD (gastroesophageal reflux disease)   . Asthma   . Arthritis   . Hypothyroidism   . Chronic kidney disease     Bright's Disease at age 63   . Tremor   . CHF (congestive heart failure) ([place])   . Anxiety and depression 12/14/2008    Qualifier: Diagnosis of  By: [name] LPN, Almyra Free    . Pulmonary emboli ([place]) 05/26/2013  . H. pylori infection 10/19/2013  . UTI (lower urinary tract infection) 10/19/2013  . Anemia 07/05/2014  . Medicare annual wellness visit, subsequent 02/21/2015  . Left hip pain 05/23/2015  . Atypical chest pain 05/23/2015  . Low back pain 09/19/2015  . Macular degeneration of both eyes 12/16/2015    Right eye is wet Left Eye is dry Shots every 11 to 12 weeks in right eye at opthamologist office    Past Surgical History  Procedure Laterality Date  . Carotid endarterectomy Right 05/10/07    cea  . Knees replaced  2005 and 2011    both knees  . Wisdom tooth extraction    . Abdominal hysterectomy  1984  . Cataract extraction Left   . Eye surgery      b/l    Family History  Problem Relation Age of Onset  . Stroke Mother     mini stroke  . Kidney disease Mother   . Heart failure Mother   . Hypertension Mother   . Diabetes Sister     type 2  . Hyperlipidemia Sister   . Heart attack Maternal Grandfather     Social History   Social History  . Marital Status: Divorced    Spouse Name: N/A  . Number of Children: 2  . Years of Education: N/A   Occupational History  . Not on file.   Social History Main Topics  . Smoking status: Former Smoker --  2.00 packs/day for 30 years    Types: Cigarettes    Quit date: 12/03/1994  . Smokeless tobacco: Never Used  . Alcohol Use: No  . Drug Use: No  . Sexual Activity: No     Comment: lives with son and friend, no dietary restrictions   Other Topics Concern  . Not on file   Social History Narrative   epworth sleepiness scale = 11 (06/15/2015)    Outpatient Prescriptions Prior to Visit  Medication Sig Dispense Refill  . acetaminophen (TYLENOL) 500 MG tablet Take 500 mg by mouth every 6 (six) hours as needed.    Marland Kitchen albuterol (PROVENTIL HFA;VENTOLIN HFA) 108 (90 BASE) MCG/ACT inhaler Inhale 2 puffs into the lungs every 4 (four) hours as needed for wheezing.    . calcium citrate-vitamin D (CITRACAL+D) 315-200 MG-UNIT per tablet Take 1 tablet by mouth daily.     . Cholecalciferol (D3 MAXIMUM STRENGTH) 5000 UNITS capsule Take 5,000 Units by mouth daily.    . Choline Fenofibrate (TRILIPIX) 135 MG capsule Take 1 capsule (135 mg total) by mouth daily. 90 capsule 1  .  citalopram (CELEXA) 20 MG tablet Take 2 tablets (40 mg total) by mouth daily. 180 tablet 1  . clonazePAM (KLONOPIN) 1 MG tablet Take 1 tablet (1 mg total) by mouth 3 (three) times daily as needed for anxiety. 90 tablet 3  . Cyanocobalamin (VITAMIN B-12 PO) Take by mouth.    . esomeprazole (NEXIUM) 40 MG capsule Take 1 capsule (40 mg total) by mouth daily at 12 noon. 90 capsule 1  . ezetimibe (ZETIA) 10 MG tablet Take 1 tablet (10 mg total) by mouth daily. 90 tablet 1  . levalbuterol (XOPENEX) 1.25 MG/0.5ML nebulizer solution Take 1.25 mg by nebulization every 4 (four) hours as needed for wheezing. 30 each 1  . levothyroxine (SYNTHROID, LEVOTHROID) 137 MCG tablet Take 1 tablet (137 mcg total) by mouth daily before breakfast. 90 tablet 1  . montelukast (SINGULAIR) 10 MG tablet Take 1 tablet (10 mg total) by mouth at bedtime. Does not take Wed & Sun 90 tablet 1  . nitroGLYCERIN (NITROSTAT) 0.4 MG SL tablet Place 1 tablet (0.4 mg total) under  the tongue every 5 (five) minutes as needed for chest pain. 25 tablet 3  . ranitidine (ZANTAC) 300 MG tablet Take 1 tablet (300 mg total) by mouth daily with breakfast. 90 tablet 1  . valsartan (DIOVAN) 160 MG tablet TAKE ONE TABLET BY MOUTH ONCE DAILY 30 tablet 4  . warfarin (COUMADIN) 5 MG tablet TAKE AS DIRECTED 90 tablet 1  . metoprolol succinate (TOPROL-XL) 25 MG 24 hr tablet Take 1 tablet (25 mg total) by mouth daily. 90 tablet 1  . traMADol (ULTRAM) 50 MG tablet Take 1 tablet (50 mg total) by mouth every 8 (eight) hours as needed for moderate pain or severe pain. 90 tablet 0   No facility-administered medications prior to visit.    Allergies  Allergen Reactions  . Niaspan [Niacin Er] Nausea And Vomiting and Swelling    Swelling in mouth  . Pantoprazole     Mouth sores  . Bupropion Other (See Comments)    Uncontrollable shakes  . Penicillins Hives  . Statins   . Sulfonamide Derivatives Hives  . Apple Rash  . Banana Rash  . Milk-Related Compounds Rash    Review of Systems  Constitutional: Negative for fever and malaise/fatigue.  HENT: Negative for congestion.   Eyes: Negative for blurred vision.  Respiratory: Negative for shortness of breath.   Cardiovascular: Negative for chest pain, palpitations and leg swelling.  Gastrointestinal: Negative for nausea, abdominal pain and blood in stool.  Genitourinary: Negative for dysuria and frequency.  Musculoskeletal: Negative for falls.  Skin: Negative for rash.  Neurological: Negative for dizziness, loss of consciousness and headaches.  Endo/Heme/Allergies: Negative for environmental allergies.  Psychiatric/Behavioral: Negative for depression. The patient is not nervous/anxious.        Objective:    Physical Exam  Constitutional: She is oriented to person, place, and time. She appears well-developed and well-nourished. No distress.  HENT:  Head: Normocephalic and atraumatic.  Eyes: Conjunctivae are normal.  Neck: Neck  supple. No thyromegaly present.  Cardiovascular: Normal rate, regular rhythm and normal heart sounds.   No murmur heard. Pulmonary/Chest: Effort normal and breath sounds normal. No respiratory distress.  Abdominal: Soft. Bowel sounds are normal. She exhibits no distension and no mass. There is no tenderness.  Musculoskeletal: She exhibits no edema.  Lymphadenopathy:    She has no cervical adenopathy.  Neurological: She is alert and oriented to person, place, and time.  Skin: Skin is warm  and dry.  Psychiatric: She has a normal mood and affect. Her behavior is normal.    BP 128/78 mmHg  Pulse 68  Temp(Src) 98.1 F (36.7 C) (Oral)  Ht 5\' 4"  (1.626 m)  Wt 185 lb 6 oz (84.086 kg)  BMI 31.80 kg/m2  SpO2 96% Wt Readings from Last 3 Encounters:  12/16/15 185 lb 6 oz (84.086 kg)  10/18/15 188 lb (85.276 kg)  09/13/15 188 lb 6 oz (85.446 kg)     Lab Results  Component Value Date   WBC 5.7 09/13/2015   HGB 12.1 09/13/2015   HCT 35.8* 09/13/2015   PLT 215.0 09/13/2015   GLUCOSE 77 09/13/2015   CHOL 192 09/13/2015   TRIG 224.0* 09/13/2015   HDL 51.00 09/13/2015   LDLDIRECT 113.0 09/13/2015   LDLCALC 83 10/16/2013   ALT 10 09/13/2015   AST 16 09/13/2015   NA 140 09/13/2015   K 4.4 09/13/2015   CL 105 09/13/2015   CREATININE 2.08* 09/13/2015   BUN 34* 09/13/2015   CO2 27 09/13/2015   TSH 0.97 09/13/2015   INR 2.1 12/02/2015   HGBA1C 5.3 09/13/2015    Lab Results  Component Value Date   TSH 0.97 09/13/2015   Lab Results  Component Value Date   WBC 5.7 09/13/2015   HGB 12.1 09/13/2015   HCT 35.8* 09/13/2015   MCV 88.8 09/13/2015   PLT 215.0 09/13/2015   Lab Results  Component Value Date   NA 140 09/13/2015   K 4.4 09/13/2015   CO2 27 09/13/2015   GLUCOSE 77 09/13/2015   BUN 34* 09/13/2015   CREATININE 2.08* 09/13/2015   BILITOT 0.5 09/13/2015   ALKPHOS 38* 09/13/2015   AST 16 09/13/2015   ALT 10 09/13/2015   PROT 7.2 09/13/2015   ALBUMIN 4.4 09/13/2015    CALCIUM 9.6 09/13/2015   ANIONGAP 13 03/14/2014   GFR 25.20* 09/13/2015   Lab Results  Component Value Date   CHOL 192 09/13/2015   Lab Results  Component Value Date   HDL 51.00 09/13/2015   Lab Results  Component Value Date   LDLCALC 83 10/16/2013   Lab Results  Component Value Date   TRIG 224.0* 09/13/2015   Lab Results  Component Value Date   CHOLHDL 4 09/13/2015   Lab Results  Component Value Date   HGBA1C 5.3 09/13/2015       Assessment & Plan:   Problem List Items Addressed This Visit      Cardiovascular and Mediastinum   Essential hypertension - Primary    Well controlled, no changes to meds. Encouraged heart healthy diet such as the DASH diet and exercise as tolerated. She stopped her Metoprolol accidentally and has felt fine so she did not restart it. Will hold off ofr now since her numbers are good.         Respiratory   COPD (chronic obstructive pulmonary disease) ([place])    Sees pulmonology next month is doing well      OSA (obstructive sleep apnea)    No longer needs CPAP        Digestive   GERD    Avoid offending foods, start probiotics. Do not eat large meals in late evening and consider raising head of bed.         Other   Anxiety and depression    Citalopram increased to 40 mg and is doing better. Using Clonazepam only once a day and is doing well. Her granddaughter is moving out June  9 and that will also help      Hyperlipidemia, mixed    Encouraged heart healthy diet, increase exercise, avoid trans fats, consider a krill oil cap daily      Macular degeneration of both eyes    Right eye is wet Left Eye is dry Shots every 11 to 12 weeks in right eye at opthamologist office Is doing well          I have discontinued Ms. Maphis metoprolol succinate. I am also having her maintain her calcium citrate-vitamin D, Cholecalciferol, albuterol, nitroGLYCERIN, levalbuterol, Cyanocobalamin (VITAMIN B-12 PO), acetaminophen, levothyroxine,  ezetimibe, esomeprazole, ranitidine, montelukast, warfarin, Choline Fenofibrate, clonazePAM, citalopram, valsartan, and traMADol.  Meds ordered this encounter  Medications  . traMADol (ULTRAM) 50 MG tablet    Sig: Take 1 tablet (50 mg total) by mouth every 8 (eight) hours as needed for moderate pain or severe pain.    Dispense:  90 tablet    Refill:  0     Jan Fireman, RMA'

## 2015-12-16 NOTE — Progress Notes (Signed)
Patient ID: Emily Mitchell, female   DOB: [birth year], 70 y.o.   MRN: [id number]   Subjective:    Patient ID: Emily Mitchell, female    DOB: 24-Mar-1946, 70 y.o.   MRN: [id number]  Chief Complaint  Patient presents with  . Follow-up    HPI Patient is in today for follow up. Feels well today. No recent illness, no acute concerns. Denies CP/palp/SOB/HA/congestion/fevers/GI or GU c/o. Taking meds as prescribed  Past Medical History  Diagnosis Date  . Carotid artery occlusion   . Hyperlipidemia   . Hypertension   . COPD (chronic obstructive pulmonary disease) ([place])   . Chicken pox as a child  . Mumps 24 yrs old  . GERD (gastroesophageal reflux disease)   . Asthma   . Arthritis   . Hypothyroidism   . Chronic kidney disease     Bright's Disease at age 39   . Tremor   . CHF (congestive heart failure) ([place])   . Anxiety and depression 12/14/2008    Qualifier: Diagnosis of  By: [name] LPN, Almyra Free    . Pulmonary emboli ([place]) 05/26/2013  . H. pylori infection 10/19/2013  . UTI (lower urinary tract infection) 10/19/2013  . Anemia 07/05/2014  . Medicare annual wellness visit, subsequent 02/21/2015  . Left hip pain 05/23/2015  . Atypical chest pain 05/23/2015  . Low back pain 09/19/2015  . Macular degeneration of both eyes 12/16/2015    Right eye is wet Left Eye is dry Shots every 11 to 12 weeks in right eye at opthamologist office    Past Surgical History  Procedure Laterality Date  . Carotid endarterectomy Right 05/10/07    cea  . Knees replaced  2005 and 2011    both knees  . Wisdom tooth extraction    . Abdominal hysterectomy  1984  . Cataract extraction Left   . Eye surgery      b/l    Family History  Problem Relation Age of Onset  . Stroke Mother     mini stroke  . Kidney disease Mother   . Heart failure Mother   . Hypertension Mother   . Diabetes Sister     type 2  . Hyperlipidemia Sister   . Heart attack Maternal Grandfather     Social History   Social History  .  Marital Status: Divorced    Spouse Name: N/A  . Number of Children: 2  . Years of Education: N/A   Occupational History  . Not on file.   Social History Main Topics  . Smoking status: Former Smoker -- 2.00 packs/day for 30 years    Types: Cigarettes    Quit date: 12/03/1994  . Smokeless tobacco: Never Used  . Alcohol Use: No  . Drug Use: No  . Sexual Activity: No     Comment: lives with son and friend, no dietary restrictions   Other Topics Concern  . Not on file   Social History Narrative   epworth sleepiness scale = 11 (06/15/2015)    Outpatient Prescriptions Prior to Visit  Medication Sig Dispense Refill  . acetaminophen (TYLENOL) 500 MG tablet Take 500 mg by mouth every 6 (six) hours as needed.    Marland Kitchen albuterol (PROVENTIL HFA;VENTOLIN HFA) 108 (90 BASE) MCG/ACT inhaler Inhale 2 puffs into the lungs every 4 (four) hours as needed for wheezing.    . calcium citrate-vitamin D (CITRACAL+D) 315-200 MG-UNIT per tablet Take 1 tablet by mouth daily.     . Cholecalciferol (D3  MAXIMUM STRENGTH) 5000 UNITS capsule Take 5,000 Units by mouth daily.    . Choline Fenofibrate (TRILIPIX) 135 MG capsule Take 1 capsule (135 mg total) by mouth daily. 90 capsule 1  . citalopram (CELEXA) 20 MG tablet Take 2 tablets (40 mg total) by mouth daily. 180 tablet 1  . clonazePAM (KLONOPIN) 1 MG tablet Take 1 tablet (1 mg total) by mouth 3 (three) times daily as needed for anxiety. 90 tablet 3  . Cyanocobalamin (VITAMIN B-12 PO) Take by mouth.    . esomeprazole (NEXIUM) 40 MG capsule Take 1 capsule (40 mg total) by mouth daily at 12 noon. 90 capsule 1  . ezetimibe (ZETIA) 10 MG tablet Take 1 tablet (10 mg total) by mouth daily. 90 tablet 1  . levalbuterol (XOPENEX) 1.25 MG/0.5ML nebulizer solution Take 1.25 mg by nebulization every 4 (four) hours as needed for wheezing. 30 each 1  . levothyroxine (SYNTHROID, LEVOTHROID) 137 MCG tablet Take 1 tablet (137 mcg total) by mouth daily before breakfast. 90 tablet  1  . montelukast (SINGULAIR) 10 MG tablet Take 1 tablet (10 mg total) by mouth at bedtime. Does not take Wed & Sun 90 tablet 1  . nitroGLYCERIN (NITROSTAT) 0.4 MG SL tablet Place 1 tablet (0.4 mg total) under the tongue every 5 (five) minutes as needed for chest pain. 25 tablet 3  . ranitidine (ZANTAC) 300 MG tablet Take 1 tablet (300 mg total) by mouth daily with breakfast. 90 tablet 1  . valsartan (DIOVAN) 160 MG tablet TAKE ONE TABLET BY MOUTH ONCE DAILY 30 tablet 4  . warfarin (COUMADIN) 5 MG tablet TAKE AS DIRECTED 90 tablet 1  . metoprolol succinate (TOPROL-XL) 25 MG 24 hr tablet Take 1 tablet (25 mg total) by mouth daily. 90 tablet 1  . traMADol (ULTRAM) 50 MG tablet Take 1 tablet (50 mg total) by mouth every 8 (eight) hours as needed for moderate pain or severe pain. 90 tablet 0   No facility-administered medications prior to visit.    Allergies  Allergen Reactions  . Niaspan [Niacin Er] Nausea And Vomiting and Swelling    Swelling in mouth  . Pantoprazole     Mouth sores  . Bupropion Other (See Comments)    Uncontrollable shakes  . Penicillins Hives  . Statins   . Sulfonamide Derivatives Hives  . Apple Rash  . Banana Rash  . Milk-Related Compounds Rash    Review of Systems  Constitutional: Negative for fever and malaise/fatigue.  HENT: Negative for congestion.   Eyes: Negative for blurred vision.  Respiratory: Negative for shortness of breath.   Cardiovascular: Negative for chest pain, palpitations and leg swelling.  Gastrointestinal: Negative for nausea, abdominal pain and blood in stool.  Genitourinary: Negative for dysuria and frequency.  Musculoskeletal: Negative for falls.  Skin: Negative for rash.  Neurological: Negative for dizziness, loss of consciousness and headaches.  Endo/Heme/Allergies: Negative for environmental allergies.  Psychiatric/Behavioral: Negative for depression. The patient is not nervous/anxious.        Objective:    Physical Exam    Constitutional: She is oriented to person, place, and time. She appears well-developed and well-nourished. No distress.  HENT:  Head: Normocephalic and atraumatic.  Nose: Nose normal.  Eyes: Right eye exhibits no discharge. Left eye exhibits no discharge.  Neck: Normal range of motion. Neck supple.  Cardiovascular: Normal rate and regular rhythm.   No murmur heard. Pulmonary/Chest: Effort normal and breath sounds normal.  Abdominal: Soft. Bowel sounds are normal. There is  no tenderness.  Musculoskeletal: She exhibits no edema.  Neurological: She is alert and oriented to person, place, and time.  Skin: Skin is warm and dry.  Psychiatric: She has a normal mood and affect.  Nursing note and vitals reviewed.   BP 128/78 mmHg  Pulse 68  Temp(Src) 98.1 F (36.7 C) (Oral)  Ht 5\' 4"  (1.626 m)  Wt 185 lb 6 oz (84.086 kg)  BMI 31.80 kg/m2  SpO2 96% Wt Readings from Last 3 Encounters:  12/16/15 185 lb 6 oz (84.086 kg)  10/18/15 188 lb (85.276 kg)  09/13/15 188 lb 6 oz (85.446 kg)     Lab Results  Component Value Date   WBC 5.7 09/13/2015   HGB 12.1 09/13/2015   HCT 35.8* 09/13/2015   PLT 215.0 09/13/2015   GLUCOSE 77 09/13/2015   CHOL 192 09/13/2015   TRIG 224.0* 09/13/2015   HDL 51.00 09/13/2015   LDLDIRECT 113.0 09/13/2015   LDLCALC 83 10/16/2013   ALT 10 09/13/2015   AST 16 09/13/2015   NA 140 09/13/2015   K 4.4 09/13/2015   CL 105 09/13/2015   CREATININE 2.08* 09/13/2015   BUN 34* 09/13/2015   CO2 27 09/13/2015   TSH 0.97 09/13/2015   INR 2.1 12/02/2015   HGBA1C 5.3 09/13/2015    Lab Results  Component Value Date   TSH 0.97 09/13/2015   Lab Results  Component Value Date   WBC 5.7 09/13/2015   HGB 12.1 09/13/2015   HCT 35.8* 09/13/2015   MCV 88.8 09/13/2015   PLT 215.0 09/13/2015   Lab Results  Component Value Date   NA 140 09/13/2015   K 4.4 09/13/2015   CO2 27 09/13/2015   GLUCOSE 77 09/13/2015   BUN 34* 09/13/2015   CREATININE 2.08* 09/13/2015    BILITOT 0.5 09/13/2015   ALKPHOS 38* 09/13/2015   AST 16 09/13/2015   ALT 10 09/13/2015   PROT 7.2 09/13/2015   ALBUMIN 4.4 09/13/2015   CALCIUM 9.6 09/13/2015   ANIONGAP 13 03/14/2014   GFR 25.20* 09/13/2015   Lab Results  Component Value Date   CHOL 192 09/13/2015   Lab Results  Component Value Date   HDL 51.00 09/13/2015   Lab Results  Component Value Date   LDLCALC 83 10/16/2013   Lab Results  Component Value Date   TRIG 224.0* 09/13/2015   Lab Results  Component Value Date   CHOLHDL 4 09/13/2015   Lab Results  Component Value Date   HGBA1C 5.3 09/13/2015       Assessment & Plan:   Problem List Items Addressed This Visit    OSA (obstructive sleep apnea)    No longer needs CPAP      Macular degeneration of both eyes    Right eye is wet Left Eye is dry Shots every 11 to 12 weeks in right eye at opthamologist office Is doing well       Hyperlipidemia, mixed    Encouraged heart healthy diet, increase exercise, avoid trans fats, consider a krill oil cap daily      GERD    Avoid offending foods, start probiotics. Do not eat large meals in late evening and consider raising head of bed.       Essential hypertension - Primary    Well controlled, no changes to meds. Encouraged heart healthy diet such as the DASH diet and exercise as tolerated. She stopped her Metoprolol accidentally and has felt fine so she did not restart it. Will hold  off ofr now since her numbers are good.       COPD (chronic obstructive pulmonary disease) ([place])    Sees pulmonology next month is doing well      Anxiety and depression    Citalopram increased to 40 mg and is doing better. Using Clonazepam only once a day and is doing well. Her granddaughter is moving out June 9 and that will also help         I have discontinued Ms. Loder metoprolol succinate. I am also having her maintain her calcium citrate-vitamin D, Cholecalciferol, albuterol, nitroGLYCERIN, levalbuterol,  Cyanocobalamin (VITAMIN B-12 PO), acetaminophen, levothyroxine, ezetimibe, esomeprazole, ranitidine, montelukast, warfarin, Choline Fenofibrate, clonazePAM, citalopram, valsartan, and traMADol.  Meds ordered this encounter  Medications  . traMADol (ULTRAM) 50 MG tablet    Sig: Take 1 tablet (50 mg total) by mouth every 8 (eight) hours as needed for moderate pain or severe pain.    Dispense:  90 tablet    Refill:  0     Penni Homans, MD

## 2015-12-16 NOTE — Assessment & Plan Note (Signed)
Encouraged heart healthy diet, increase exercise, avoid trans fats, consider a krill oil cap daily 

## 2015-12-16 NOTE — Progress Notes (Signed)
Pre visit review using our clinic review tool, if applicable. No additional management support is needed unless otherwise documented below in the visit note. 

## 2015-12-30 ENCOUNTER — Ambulatory Visit (INDEPENDENT_AMBULATORY_CARE_PROVIDER_SITE_OTHER): Payer: Commercial Managed Care - HMO | Admitting: Behavioral Health

## 2015-12-30 DIAGNOSIS — Z86711 Personal history of pulmonary embolism: Secondary | ICD-10-CM | POA: Diagnosis not present

## 2015-12-30 LAB — POCT INR: INR: 2.1

## 2015-12-30 NOTE — Patient Instructions (Signed)
Continue taking 1 tablet every day, except take 1/2 tablet on Monday, Thursday, and Saturday. Recheck INR in 4 weeks. 

## 2015-12-30 NOTE — Progress Notes (Signed)
Pre visit review using our clinic review tool, if applicable. No additional management support is needed unless otherwise documented below in the visit note.  Patient in clinic today for INR check. Reading was 2.1. Patient did not report any positive findings. Informed patient to continue taking 1 tablet every day, except take 1/2 tablet on Monday, Thursday, and Saturday. Recheck INR in 4 weeks. She voiced understanding and did not have any questions or concerns prior to leaving the nurse visit. Next appointment scheduled for 01/27/16 at 2:00 PM.

## 2016-01-13 ENCOUNTER — Encounter: Payer: Self-pay | Admitting: Pulmonary Disease

## 2016-01-13 ENCOUNTER — Ambulatory Visit (INDEPENDENT_AMBULATORY_CARE_PROVIDER_SITE_OTHER): Payer: Commercial Managed Care - HMO | Admitting: Pulmonary Disease

## 2016-01-13 VITALS — BP 102/66 | HR 70 | Ht 63.0 in | Wt 184.0 lb

## 2016-01-13 DIAGNOSIS — G4733 Obstructive sleep apnea (adult) (pediatric): Secondary | ICD-10-CM | POA: Diagnosis not present

## 2016-01-13 DIAGNOSIS — J449 Chronic obstructive pulmonary disease, unspecified: Secondary | ICD-10-CM

## 2016-01-13 NOTE — Assessment & Plan Note (Signed)
Not clinically significant No need for CPAP Has done well off oxygen

## 2016-01-13 NOTE — Patient Instructions (Signed)
Call as needed 

## 2016-01-13 NOTE — Assessment & Plan Note (Signed)
No significant dyspnea Use albuterol when necessary only

## 2016-01-13 NOTE — Progress Notes (Signed)
   Subjective:    Patient ID: Emily Mitchell, female    DOB: [birth year], 70 y.o.   MRN: [id number]  HPI  70 y.o for FU of obstructive sleep apnea  - not presently on CPAP    She was diagnosed with PE in 01/2013 at [facility] - on coumadin  Placed on O2 during sleep , dc'd 12/2014   01/13/2016  Chief Complaint  Patient presents with  . Follow-up    OSA without CPAP.  Grandson woke pt up at night because she was breathing heavy in sleep, rapid breathing.  chest tightness, checked out by cardio, heart was fine.      She has done well off oxygen No witnessed apneas Grandson reports occasional snoring and heavy breathing during her sleep which seemed to improve when he woke her up  She takes occasional daytime naps but denies daytime somnolence and feels well rested in the mornings,  She has not needed to use albuterol MDI or Xopenex nebs Significant tests/ events  PSG at [facility] 08/2011 -217 lbs -AHI 4/h, TST 319 mins,supine - and Dr Jarome Lamas said she did not need CPAP. Dr Luan Pulling said she did  Copd dx 2004. Pt saw [facility] in the past.  Spirometry 04/2013: FEV1 72% predicted FEV1 FVC ratio 73   PSG 07/2013 -208 lbs -showed mild OSA TST - 390 mins,  AHI  6/h, RDI of 8/h The lowest desaturation was 87%   Echo 08/2014 grade 2 DD  12/2014 ONO - no desatn >> dc O2    Review of Systems Patient denies significant dyspnea,cough, hemoptysis,  chest pain, palpitations, pedal edema, orthopnea, paroxysmal nocturnal dyspnea, lightheadedness, nausea, vomiting, abdominal or  leg pains      Objective:   Physical Exam  Gen. Pleasant, obese, in no distress ENT - no lesions, no post nasal drip Neck: No JVD, no thyromegaly, no carotid bruits Lungs: no use of accessory muscles, no dullness to percussion, decreased without rales or rhonchi  Cardiovascular: Rhythm regular, heart sounds  normal, no murmurs or gallops, no peripheral edema Musculoskeletal: No deformities, no  cyanosis or clubbing , no tremors       Assessment & Plan:

## 2016-01-27 ENCOUNTER — Ambulatory Visit (INDEPENDENT_AMBULATORY_CARE_PROVIDER_SITE_OTHER): Payer: Commercial Managed Care - HMO | Admitting: Behavioral Health

## 2016-01-27 ENCOUNTER — Other Ambulatory Visit: Payer: Self-pay | Admitting: Family Medicine

## 2016-01-27 DIAGNOSIS — Z86711 Personal history of pulmonary embolism: Secondary | ICD-10-CM

## 2016-01-27 LAB — POCT INR: INR: 2.3

## 2016-01-27 MED ORDER — VALSARTAN 160 MG PO TABS: 160.0000 mg | ORAL_TABLET | Freq: Every day | ORAL | Status: DC

## 2016-01-27 NOTE — Patient Instructions (Signed)
Continue taking 1 tablet every day, except take 1/2 tablet on Monday, Thursday, and Saturday. Recheck INR in 4 weeks. 

## 2016-01-27 NOTE — Progress Notes (Signed)
Pre visit review using our clinic review tool, if applicable. No additional management support is needed unless otherwise documented below in the visit note.  Patient presents in office for INR check. Reviewed current medication regimen with the patient.Today's reading was 2.3. Patient was advised to continue taking 1 tablet every day, except take 1/2 tablet on Monday, Thursday, and Saturday. Recheck INR in 4 weeks.  She verbalized understanding and did not have any questions or concerns before leaving nurse visit.  Next appointment scheduled for 02/24/16 at 2:15 PM.

## 2016-02-03 LAB — HM MAMMOGRAPHY

## 2016-02-07 ENCOUNTER — Encounter: Payer: Self-pay | Admitting: Family Medicine

## 2016-02-11 ENCOUNTER — Encounter: Payer: Self-pay | Admitting: Family Medicine

## 2016-02-17 ENCOUNTER — Ambulatory Visit: Payer: Medicare Other | Admitting: Neurology

## 2016-02-17 ENCOUNTER — Ambulatory Visit: Payer: Commercial Managed Care - HMO | Admitting: Neurology

## 2016-02-17 ENCOUNTER — Telehealth: Payer: Self-pay | Admitting: Family Medicine

## 2016-02-17 NOTE — Telephone Encounter (Signed)
Patient called back. States pharmacist suggested Benadryl will be ok to mix with other medicatons. Advised patient to call if symptoms continue or worsen. Patient agreed.

## 2016-02-17 NOTE — Telephone Encounter (Signed)
Advised patient to check with pharmacist who would better be ble to tell her if medications she is taking with not interact well with Benadryl which is what patient wants to take. Advised patient to call back if she has any problems with pharmacy or if medication suggested does not help symptoms.

## 2016-02-17 NOTE — Telephone Encounter (Signed)
°  Relationship to patient: Self Can be reached: [phone]     Reason for call: Patient request call back to discuss taking OTC cold medicine with her current medications

## 2016-02-24 ENCOUNTER — Ambulatory Visit: Payer: Commercial Managed Care - HMO

## 2016-03-01 ENCOUNTER — Encounter: Payer: Self-pay | Admitting: Family Medicine

## 2016-03-02 ENCOUNTER — Ambulatory Visit: Payer: Commercial Managed Care - HMO

## 2016-03-02 ENCOUNTER — Ambulatory Visit (INDEPENDENT_AMBULATORY_CARE_PROVIDER_SITE_OTHER): Payer: Commercial Managed Care - HMO | Admitting: *Deleted

## 2016-03-02 DIAGNOSIS — I2699 Other pulmonary embolism without acute cor pulmonale: Secondary | ICD-10-CM

## 2016-03-02 LAB — POCT INR: INR: 2.1

## 2016-03-02 NOTE — Progress Notes (Signed)
Pre visit review using our clinic review tool, if applicable. No additional management support is needed unless otherwise documented below in the visit note.  INR today 2.1. Pt findings negative.   Next appt: 03/30/16  Dorrene German, RN

## 2016-03-14 ENCOUNTER — Encounter: Payer: Self-pay | Admitting: Family Medicine

## 2016-03-14 ENCOUNTER — Telehealth: Payer: Self-pay | Admitting: Family Medicine

## 2016-03-14 ENCOUNTER — Ambulatory Visit (INDEPENDENT_AMBULATORY_CARE_PROVIDER_SITE_OTHER): Payer: Commercial Managed Care - HMO | Admitting: Family Medicine

## 2016-03-14 DIAGNOSIS — K5900 Constipation, unspecified: Secondary | ICD-10-CM

## 2016-03-14 DIAGNOSIS — K219 Gastro-esophageal reflux disease without esophagitis: Secondary | ICD-10-CM

## 2016-03-14 DIAGNOSIS — E782 Mixed hyperlipidemia: Secondary | ICD-10-CM | POA: Diagnosis not present

## 2016-03-14 DIAGNOSIS — R739 Hyperglycemia, unspecified: Secondary | ICD-10-CM | POA: Diagnosis not present

## 2016-03-14 DIAGNOSIS — F419 Anxiety disorder, unspecified: Secondary | ICD-10-CM

## 2016-03-14 DIAGNOSIS — D649 Anemia, unspecified: Secondary | ICD-10-CM

## 2016-03-14 DIAGNOSIS — I10 Essential (primary) hypertension: Secondary | ICD-10-CM | POA: Diagnosis not present

## 2016-03-14 DIAGNOSIS — Z0000 Encounter for general adult medical examination without abnormal findings: Secondary | ICD-10-CM

## 2016-03-14 DIAGNOSIS — E039 Hypothyroidism, unspecified: Secondary | ICD-10-CM | POA: Diagnosis not present

## 2016-03-14 DIAGNOSIS — F418 Other specified anxiety disorders: Secondary | ICD-10-CM

## 2016-03-14 DIAGNOSIS — F329 Major depressive disorder, single episode, unspecified: Secondary | ICD-10-CM

## 2016-03-14 DIAGNOSIS — F32A Depression, unspecified: Secondary | ICD-10-CM

## 2016-03-14 HISTORY — DX: Hyperglycemia, unspecified: R73.9

## 2016-03-14 HISTORY — DX: Encounter for general adult medical examination without abnormal findings: Z00.00

## 2016-03-14 LAB — COMPREHENSIVE METABOLIC PANEL
ALBUMIN: 4.2 g/dL (ref 3.5–5.2)
ALK PHOS: 35 U/L — AB (ref 39–117)
ALT: 11 U/L (ref 0–35)
AST: 17 U/L (ref 0–37)
BILIRUBIN TOTAL: 0.4 mg/dL (ref 0.2–1.2)
BUN: 38 mg/dL — ABNORMAL HIGH (ref 6–23)
CALCIUM: 9.6 mg/dL (ref 8.4–10.5)
CO2: 26 mEq/L (ref 19–32)
CREATININE: 1.76 mg/dL — AB (ref 0.40–1.20)
Chloride: 105 mEq/L (ref 96–112)
GFR: 30.52 mL/min — AB (ref >60.00)
Glucose, Bld: 85 mg/dL (ref 70–99)
Potassium: 4.2 mEq/L (ref 3.5–5.1)
Sodium: 137 mEq/L (ref 135–145)
TOTAL PROTEIN: 6.8 g/dL (ref 6.0–8.3)

## 2016-03-14 LAB — HEMOGLOBIN A1C: HEMOGLOBIN A1C: 5.2 % (ref 4.6–6.5)

## 2016-03-14 LAB — CBC
HEMATOCRIT: 34.3 % — AB (ref 36.0–46.0)
Hemoglobin: 11.7 g/dL — ABNORMAL LOW (ref 12.0–15.0)
MCHC: 34 g/dL (ref 30.0–36.0)
MCV: 88.5 fl (ref 78.0–100.0)
Platelets: 233 10*3/uL (ref 150.0–400.0)
RBC: 3.87 Mil/uL (ref 3.87–5.11)
RDW: 14.5 % (ref 11.5–15.5)
WBC: 6.8 10*3/uL (ref 4.0–10.5)

## 2016-03-14 LAB — LIPID PANEL
CHOLESTEROL: 187 mg/dL (ref 0–200)
HDL: 46.9 mg/dL (ref >39.00)
NonHDL: 139.86
TRIGLYCERIDES: 259 mg/dL — AB (ref 0.0–149.0)
Total CHOL/HDL Ratio: 4
VLDL: 51.8 mg/dL — ABNORMAL HIGH (ref 0.0–40.0)

## 2016-03-14 LAB — TSH: TSH: 0.16 u[IU]/mL — ABNORMAL LOW (ref 0.35–4.50)

## 2016-03-14 LAB — LDL CHOLESTEROL, DIRECT: LDL DIRECT: 105 mg/dL

## 2016-03-14 MED ORDER — FUROSEMIDE 20 MG PO TABS: 20.0000 mg | ORAL_TABLET | Freq: Every day | ORAL | 3 refills | Status: DC | PRN

## 2016-03-14 MED ORDER — VALSARTAN 80 MG PO TABS: 80.0000 mg | ORAL_TABLET | Freq: Every day | ORAL | 1 refills | Status: DC

## 2016-03-14 MED ORDER — CLONAZEPAM 1 MG PO TABS: 1.0000 mg | ORAL_TABLET | Freq: Three times a day (TID) | ORAL | 2 refills | Status: DC | PRN

## 2016-03-14 NOTE — Assessment & Plan Note (Signed)
Doing well despite family stressors, no changes

## 2016-03-14 NOTE — Assessment & Plan Note (Signed)
Encouraged increased hydration and fiber in diet. Daily probiotics. If bowels not moving can use MOM 2 tbls po in 4 oz of warm prune juice by mouth every 2-3 days. If no results then repeat in 4 hours with  Dulcolax suppository pr, may repeat again in 4 more hours as needed. Seek care if symptoms worsen. Consider daily Miralax and/or Dulcolax if symptoms persist.  

## 2016-03-14 NOTE — Assessment & Plan Note (Signed)
Continues to follow with [facility]

## 2016-03-14 NOTE — Telephone Encounter (Signed)
Sent fax to [facility] requesting imaging records per PCP.

## 2016-03-14 NOTE — Progress Notes (Signed)
Patient ID: Emily Mitchell, female   DOB: Jun 09, 1946, 70 y.o.   MRN: [id number]   Subjective:    Patient ID: Emily Mitchell, female    DOB: [birth year], 70 y.o.   MRN: [id number]  Chief Complaint  Patient presents with  . Annual Exam    HPI Patient is in today for annual exam and follow up. She continues to struggle with strained family relationships and stressors but feels she is handling it well most days. Only uses Clonazepam prn at bedtime for sleep. No other recent illness or hospitalization. No polyuria or polydipsia. No difficulty with ADLs. Denies CP/palp/SOB/HAfevers/GI or GU c/o. Taking meds as prescribed. Has noted some nasal congestion and malaise lately  Past Medical History:  Diagnosis Date  . Anemia 07/05/2014  . Anxiety and depression 12/14/2008   Qualifier: Diagnosis of  By: [name] LPN, Almyra Free    . Arthritis   . Asthma   . Atypical chest pain 05/23/2015  . Carotid artery occlusion   . CHF (congestive heart failure) ([place])   . Chicken pox as a child  . Chronic kidney disease    Bright's Disease at age 13   . COPD (chronic obstructive pulmonary disease) ([place])   . GERD (gastroesophageal reflux disease)   . H. pylori infection 10/19/2013  . Hyperglycemia 03/14/2016  . Hyperlipidemia   . Hypertension   . Hypothyroidism   . Left hip pain 05/23/2015  . Low back pain 09/19/2015  . Macular degeneration of both eyes 12/16/2015   Right eye is wet Left Eye is dry Shots every 11 to 12 weeks in right eye at opthamologist office  . Medicare annual wellness visit, subsequent 02/21/2015  . Mumps 70 yrs old  . Preventative health care 03/14/2016  . Pulmonary emboli ([place]) 05/26/2013  . Tremor   . UTI (lower urinary tract infection) 10/19/2013    Past Surgical History:  Procedure Laterality Date  . ABDOMINAL HYSTERECTOMY  1984  . CAROTID ENDARTERECTOMY Right 05/10/07   cea  . CATARACT EXTRACTION Left   . EYE SURGERY     b/l  . knees replaced  2005 and 2011   both knees  . WISDOM  TOOTH EXTRACTION      Family History  Problem Relation Age of Onset  . Stroke Mother     mini stroke  . Kidney disease Mother   . Heart failure Mother   . Hypertension Mother   . Diabetes Sister     type 2  . Hyperlipidemia Sister   . Heart attack Maternal Grandfather     Social History   Social History  . Marital status: Divorced    Spouse name: N/A  . Number of children: 2  . Years of education: N/A   Occupational History  . Not on file.   Social History Main Topics  . Smoking status: Former Smoker    Packs/day: 2.00    Years: 30.00    Types: Cigarettes    Quit date: 12/03/1994  . Smokeless tobacco: Never Used  . Alcohol use No  . Drug use: No  . Sexual activity: No     Comment: lives with son and friend, no dietary restrictions   Other Topics Concern  . Not on file   Social History Narrative   epworth sleepiness scale = 11 (06/15/2015)    Outpatient Medications Prior to Visit  Medication Sig Dispense Refill  . acetaminophen (TYLENOL) 500 MG tablet Take 500 mg by mouth every 6 (six) hours as needed.    Marland Kitchen  albuterol (PROVENTIL HFA;VENTOLIN HFA) 108 (90 BASE) MCG/ACT inhaler Inhale 2 puffs into the lungs every 4 (four) hours as needed for wheezing.    . calcium citrate-vitamin D (CITRACAL+D) 315-200 MG-UNIT per tablet Take 1 tablet by mouth daily.     . Cholecalciferol (D3 MAXIMUM STRENGTH) 5000 UNITS capsule Take 5,000 Units by mouth daily.    . Choline Fenofibrate (TRILIPIX) 135 MG capsule Take 1 capsule (135 mg total) by mouth daily. 90 capsule 1  . citalopram (CELEXA) 20 MG tablet Take 2 tablets (40 mg total) by mouth daily. 180 tablet 1  . Cyanocobalamin (VITAMIN B-12 PO) Take by mouth.    . esomeprazole (NEXIUM) 40 MG capsule Take 1 capsule (40 mg total) by mouth daily at 12 noon. 90 capsule 1  . ezetimibe (ZETIA) 10 MG tablet Take 1 tablet (10 mg total) by mouth daily. 90 tablet 1  . levalbuterol (XOPENEX) 1.25 MG/0.5ML nebulizer solution Take 1.25 mg by  nebulization every 4 (four) hours as needed for wheezing. 30 each 1  . levothyroxine (SYNTHROID, LEVOTHROID) 137 MCG tablet Take 1 tablet (137 mcg total) by mouth daily before breakfast. 90 tablet 1  . montelukast (SINGULAIR) 10 MG tablet Take 1 tablet (10 mg total) by mouth at bedtime. Does not take Wed & Sun 90 tablet 1  . nitroGLYCERIN (NITROSTAT) 0.4 MG SL tablet Place 1 tablet (0.4 mg total) under the tongue every 5 (five) minutes as needed for chest pain. 25 tablet 3  . ranitidine (ZANTAC) 300 MG tablet Take 1 tablet (300 mg total) by mouth daily with breakfast. 90 tablet 1  . traMADol (ULTRAM) 50 MG tablet Take 1 tablet (50 mg total) by mouth every 8 (eight) hours as needed for moderate pain or severe pain. 90 tablet 0  . warfarin (COUMADIN) 5 MG tablet TAKE AS DIRECTED 90 tablet 1  . clonazePAM (KLONOPIN) 1 MG tablet Take 1 tablet (1 mg total) by mouth 3 (three) times daily as needed for anxiety. 90 tablet 3  . valsartan (DIOVAN) 160 MG tablet Take 1 tablet (160 mg total) by mouth daily. 90 tablet 1   No facility-administered medications prior to visit.     Allergies  Allergen Reactions  . Niaspan [Niacin Er] Nausea And Vomiting and Swelling    Swelling in mouth  . Pantoprazole     Mouth sores  . Bupropion Other (See Comments)    Uncontrollable shakes  . Penicillins Hives  . Statins   . Sulfonamide Derivatives Hives  . Apple Rash  . Banana Rash  . Milk-Related Compounds Rash    Review of Systems  Constitutional: Positive for malaise/fatigue. Negative for chills and fever.  HENT: Negative for congestion and hearing loss.   Eyes: Negative for discharge.  Respiratory: Negative for cough, sputum production and shortness of breath.   Cardiovascular: Negative for chest pain, palpitations and leg swelling.  Gastrointestinal: Negative for abdominal pain, blood in stool, constipation, diarrhea, heartburn, nausea and vomiting.  Genitourinary: Negative for dysuria, frequency,  hematuria and urgency.  Musculoskeletal: Positive for back pain and myalgias. Negative for falls.  Skin: Positive for itching. Negative for rash.  Neurological: Negative for dizziness, sensory change, loss of consciousness, weakness and headaches.  Endo/Heme/Allergies: Negative for environmental allergies. Does not bruise/bleed easily.  Psychiatric/Behavioral: Negative for depression and suicidal ideas. The patient is nervous/anxious. The patient does not have insomnia.        Objective:    Physical Exam  Constitutional: She is oriented to person, place, and  time. She appears well-developed and well-nourished. No distress.  HENT:  Head: Normocephalic and atraumatic.  Eyes: Conjunctivae are normal.  Neck: Neck supple. No thyromegaly present.  Cardiovascular: Normal rate, regular rhythm and normal heart sounds.   No murmur heard. Pulmonary/Chest: Effort normal and breath sounds normal. No respiratory distress.  Abdominal: Soft. Bowel sounds are normal. She exhibits no distension and no mass. There is no tenderness.  Musculoskeletal: She exhibits no edema.  Lymphadenopathy:    She has no cervical adenopathy.  Neurological: She is alert and oriented to person, place, and time.  Skin: Skin is warm and dry.  Psychiatric: She has a normal mood and affect. Her behavior is normal.    BP (!) 96/58 (BP Location: Left Arm, Patient Position: Sitting, Cuff Size: Large)   Pulse 73   Temp 98.2 F (36.8 C) (Oral)   Resp 17   Ht 5\' 3"  (1.6 m)   Wt 187 lb 12.8 oz (85.2 kg)   SpO2 98%   BMI 33.27 kg/m  Wt Readings from Last 3 Encounters:  03/14/16 187 lb 12.8 oz (85.2 kg)  01/13/16 184 lb (83.5 kg)  12/16/15 185 lb 6 oz (84.1 kg)     Lab Results  Component Value Date   WBC 5.7 09/13/2015   HGB 12.1 09/13/2015   HCT 35.8 (L) 09/13/2015   PLT 215.0 09/13/2015   GLUCOSE 77 09/13/2015   CHOL 192 09/13/2015   TRIG 224.0 (H) 09/13/2015   HDL 51.00 09/13/2015   LDLDIRECT 113.0 09/13/2015    LDLCALC 83 10/16/2013   ALT 10 09/13/2015   AST 16 09/13/2015   NA 140 09/13/2015   K 4.4 09/13/2015   CL 105 09/13/2015   CREATININE 2.08 (H) 09/13/2015   BUN 34 (H) 09/13/2015   CO2 27 09/13/2015   TSH 0.97 09/13/2015   INR 2.1 03/02/2016   HGBA1C 5.3 09/13/2015    Lab Results  Component Value Date   TSH 0.97 09/13/2015   Lab Results  Component Value Date   WBC 5.7 09/13/2015   HGB 12.1 09/13/2015   HCT 35.8 (L) 09/13/2015   MCV 88.8 09/13/2015   PLT 215.0 09/13/2015   Lab Results  Component Value Date   NA 140 09/13/2015   K 4.4 09/13/2015   CO2 27 09/13/2015   GLUCOSE 77 09/13/2015   BUN 34 (H) 09/13/2015   CREATININE 2.08 (H) 09/13/2015   BILITOT 0.5 09/13/2015   ALKPHOS 38 (L) 09/13/2015   AST 16 09/13/2015   ALT 10 09/13/2015   PROT 7.2 09/13/2015   ALBUMIN 4.4 09/13/2015   CALCIUM 9.6 09/13/2015   ANIONGAP 13 03/14/2014   GFR 25.20 (L) 09/13/2015   Lab Results  Component Value Date   CHOL 192 09/13/2015   Lab Results  Component Value Date   HDL 51.00 09/13/2015   Lab Results  Component Value Date   LDLCALC 83 10/16/2013   Lab Results  Component Value Date   TRIG 224.0 (H) 09/13/2015   Lab Results  Component Value Date   CHOLHDL 4 09/13/2015   Lab Results  Component Value Date   HGBA1C 5.3 09/13/2015       Assessment & Plan:   Problem List Items Addressed This Visit    Anxiety and depression    Doing well despite family stressors, no changes      GERD   Constipation    Encouraged increased hydration and fiber in diet. Daily probiotics. If bowels not moving can use MOM 2  tbls po in 4 oz of warm prune juice by mouth every 2-3 days. If no results then repeat in 4 hours with  Dulcolax suppository pr, may repeat again in 4 more hours as needed. Seek care if symptoms worsen. Consider daily Miralax and/or Dulcolax if symptoms persist.       Hypothyroidism   Relevant Orders   Hemoglobin A1c   Hyperlipidemia, mixed   Relevant  Medications   valsartan (DIOVAN) 80 MG tablet   furosemide (LASIX) 20 MG tablet   Other Relevant Orders   Lipid panel   Anemia   Essential hypertension    Well controlled, no changes to meds. Encouraged heart healthy diet such as the DASH diet and exercise as tolerated.       Relevant Medications   valsartan (DIOVAN) 80 MG tablet   furosemide (LASIX) 20 MG tablet   Other Relevant Orders   Hemoglobin A1c   TSH   CBC   Comprehensive metabolic panel   Hyperglycemia   Relevant Orders   Hemoglobin A1c   Preventative health care    Patient encouraged to maintain heart healthy diet, regular exercise, adequate sleep. Consider daily probiotics. Take medications as prescribed. Has ACP documents, encouraged to bring them in        Other Visit Diagnoses   None.     I have discontinued Ms. Almendinger valsartan. I am also having her start on valsartan and furosemide. Additionally, I am having her maintain her calcium citrate-vitamin D, Cholecalciferol, albuterol, nitroGLYCERIN, levalbuterol, Cyanocobalamin (VITAMIN B-12 PO), acetaminophen, levothyroxine, ezetimibe, esomeprazole, ranitidine, montelukast, warfarin, Choline Fenofibrate, citalopram, traMADol, and clonazePAM.  Meds ordered this encounter  Medications  . valsartan (DIOVAN) 80 MG tablet    Sig: Take 1 tablet (80 mg total) by mouth daily.    Dispense:  90 tablet    Refill:  1  . clonazePAM (KLONOPIN) 1 MG tablet    Sig: Take 1 tablet (1 mg total) by mouth 3 (three) times daily as needed for anxiety.    Dispense:  90 tablet    Refill:  2  . furosemide (LASIX) 20 MG tablet    Sig: Take 1 tablet (20 mg total) by mouth daily as needed for edema.    Dispense:  30 tablet    Refill:  3     Penni Homans, MD

## 2016-03-14 NOTE — Assessment & Plan Note (Signed)
Well controlled, no changes to meds. Encouraged heart healthy diet such as the DASH diet and exercise as tolerated.  °

## 2016-03-14 NOTE — Assessment & Plan Note (Signed)
Patient encouraged to maintain heart healthy diet, regular exercise, adequate sleep. Consider daily probiotics. Take medications as prescribed. Has ACP documents, encouraged to bring them in

## 2016-03-14 NOTE — Patient Instructions (Addendum)
When you develop respiratory illness consider Vitamin c 500 mg daily, zinc such as Coldeeze, aged or black garlic, elderberry caps or liquidelevate feet above heart for 10 minutes twice daily, compression stockings, to knees light weight 10-20 mmHG on in am off in pm, minimize sodium intake and use Furosemide  As needed   Preventive Care for Adults, Female  A healthy lifestyle and preventive care can promote health and wellness. Preventive health guidelines for women include the following key practices.  A routine yearly physical is a good way to check with your health care provider about your health and preventive screening. It is a chance to share any concerns and updates on your health and to receive a thorough exam.  Visit your dentist for a routine exam and preventive care every 6 months. Brush your teeth twice a day and floss once a day. Good oral hygiene prevents tooth decay and gum disease.  The frequency of eye exams is based on your age, health, family medical history, use of contact lenses, and other factors. Follow your health care provider's recommendations for frequency of eye exams.  Eat a healthy diet. Foods like vegetables, fruits, whole grains, low-fat dairy products, and lean protein foods contain the nutrients you need without too many calories. Decrease your intake of foods high in solid fats, added sugars, and salt. Eat the right amount of calories for you.Get information about a proper diet from your health care provider, if necessary.  Regular physical exercise is one of the most important things you can do for your health. Most adults should get at least 150 minutes of moderate-intensity exercise (any activity that increases your heart rate and causes you to sweat) each week. In addition, most adults need muscle-strengthening exercises on 2 or more days a week.  Maintain a healthy weight. The body mass index (BMI) is a screening tool to identify possible weight problems. It  provides an estimate of body fat based on height and weight. Your health care provider can find your BMI and can help you achieve or maintain a healthy weight.For adults 20 years and older:  A BMI below 18.5 is considered underweight.  A BMI of 18.5 to 24.9 is normal.  A BMI of 25 to 29.9 is considered overweight.  A BMI of 30 and above is considered obese.  Maintain normal blood lipids and cholesterol levels by exercising and minimizing your intake of saturated fat. Eat a balanced diet with plenty of fruit and vegetables. Blood tests for lipids and cholesterol should begin at age 83 and be repeated every 5 years. If your lipid or cholesterol levels are high, you are over 50, or you are at high risk for heart disease, you may need your cholesterol levels checked more frequently.Ongoing high lipid and cholesterol levels should be treated with medicines if diet and exercise are not working.  If you smoke, find out from your health care provider how to quit. If you do not use tobacco, do not start.  Lung cancer screening is recommended for adults aged 49-80 years who are at high risk for developing lung cancer because of a history of smoking. A yearly low-dose CT scan of the lungs is recommended for people who have at least a 30-pack-year history of smoking and are a current smoker or have quit within the past 15 years. A pack year of smoking is smoking an average of 1 pack of cigarettes a day for 1 year (for example: 1 pack a day for 30  years or 2 packs a day for 15 years). Yearly screening should continue until the smoker has stopped smoking for at least 15 years. Yearly screening should be stopped for people who develop a health problem that would prevent them from having lung cancer treatment.  If you are pregnant, do not drink alcohol. If you are breastfeeding, be very cautious about drinking alcohol. If you are not pregnant and choose to drink alcohol, do not have more than 1 drink per day. One  drink is considered to be 12 ounces (355 mL) of beer, 5 ounces (148 mL) of wine, or 1.5 ounces (44 mL) of liquor.  Avoid use of street drugs. Do not share needles with anyone. Ask for help if you need support or instructions about stopping the use of drugs.  High blood pressure causes heart disease and increases the risk of stroke. Your blood pressure should be checked at least every 1 to 2 years. Ongoing high blood pressure should be treated with medicines if weight loss and exercise do not work.  If you are 1-43 years old, ask your health care provider if you should take aspirin to prevent strokes.  Diabetes screening is done by taking a blood sample to check your blood glucose level after you have not eaten for a certain period of time (fasting). If you are not overweight and you do not have risk factors for diabetes, you should be screened once every 3 years starting at age 57. If you are overweight or obese and you are 70-11 years of age, you should be screened for diabetes every year as part of your cardiovascular risk assessment.  Breast cancer screening is essential preventive care for women. You should practice "breast self-awareness." This means understanding the normal appearance and feel of your breasts and may include breast self-examination. Any changes detected, no matter how small, should be reported to a health care provider. Women in their 64s and 30s should have a clinical breast exam (CBE) by a health care provider as part of a regular health exam every 1 to 3 years. After age 14, women should have a CBE every year. Starting at age 10, women should consider having a mammogram (breast X-ray test) every year. Women who have a family history of breast cancer should talk to their health care provider about genetic screening. Women at a high risk of breast cancer should talk to their health care providers about having an MRI and a mammogram every year.  Breast cancer gene (BRCA)-related  cancer risk assessment is recommended for women who have family members with BRCA-related cancers. BRCA-related cancers include breast, ovarian, tubal, and peritoneal cancers. Having family members with these cancers may be associated with an increased risk for harmful changes (mutations) in the breast cancer genes BRCA1 and BRCA2. Results of the assessment will determine the need for genetic counseling and BRCA1 and BRCA2 testing.  Your health care provider may recommend that you be screened regularly for cancer of the pelvic organs (ovaries, uterus, and vagina). This screening involves a pelvic examination, including checking for microscopic changes to the surface of your cervix (Pap test). You may be encouraged to have this screening done every 3 years, beginning at age 53.  For women ages 28-65, health care providers may recommend pelvic exams and Pap testing every 3 years, or they may recommend the Pap and pelvic exam, combined with testing for human papilloma virus (HPV), every 5 years. Some types of HPV increase your risk of cervical cancer.  Testing for HPV may also be done on women of any age with unclear Pap test results.  Other health care providers may not recommend any screening for nonpregnant women who are considered low risk for pelvic cancer and who do not have symptoms. Ask your health care provider if a screening pelvic exam is right for you.  If you have had past treatment for cervical cancer or a condition that could lead to cancer, you need Pap tests and screening for cancer for at least 20 years after your treatment. If Pap tests have been discontinued, your risk factors (such as having a new sexual partner) need to be reassessed to determine if screening should resume. Some women have medical problems that increase the chance of getting cervical cancer. In these cases, your health care provider may recommend more frequent screening and Pap tests.  Colorectal cancer can be detected  and often prevented. Most routine colorectal cancer screening begins at the age of 59 years and continues through age 70 years. However, your health care provider may recommend screening at an earlier age if you have risk factors for colon cancer. On a yearly basis, your health care provider may provide home test kits to check for hidden blood in the stool. Use of a small camera at the end of a tube, to directly examine the colon (sigmoidoscopy or colonoscopy), can detect the earliest forms of colorectal cancer. Talk to your health care provider about this at age 11, when routine screening begins. Direct exam of the colon should be repeated every 5-10 years through age 80 years, unless early forms of precancerous polyps or small growths are found.  People who are at an increased risk for hepatitis B should be screened for this virus. You are considered at high risk for hepatitis B if:  You were born in a country where hepatitis B occurs often. Talk with your health care provider about which countries are considered high risk.  Your parents were born in a high-risk country and you have not received a shot to protect against hepatitis B (hepatitis B vaccine).  You have HIV or AIDS.  You use needles to inject street drugs.  You live with, or have sex with, someone who has hepatitis B.  You get hemodialysis treatment.  You take certain medicines for conditions like cancer, organ transplantation, and autoimmune conditions.  Hepatitis C blood testing is recommended for all people born from [birth year] through 1965 and any individual with known risks for hepatitis C.  Practice safe sex. Use condoms and avoid high-risk sexual practices to reduce the spread of sexually transmitted infections (STIs). STIs include gonorrhea, chlamydia, syphilis, trichomonas, herpes, HPV, and human immunodeficiency virus (HIV). Herpes, HIV, and HPV are viral illnesses that have no cure. They can result in disability, cancer, and  death.  You should be screened for sexually transmitted illnesses (STIs) including gonorrhea and chlamydia if:  You are sexually active and are younger than 24 years.  You are older than 24 years and your health care provider tells you that you are at risk for this type of infection.  Your sexual activity has changed since you were last screened and you are at an increased risk for chlamydia or gonorrhea. Ask your health care provider if you are at risk.  If you are at risk of being infected with HIV, it is recommended that you take a prescription medicine daily to prevent HIV infection. This is called preexposure prophylaxis (PrEP). You are considered at risk  if:  You are sexually active and do not regularly use condoms or know the HIV status of your partner(s).  You take drugs by injection.  You are sexually active with a partner who has HIV.  Talk with your health care provider about whether you are at high risk of being infected with HIV. If you choose to begin PrEP, you should first be tested for HIV. You should then be tested every 3 months for as long as you are taking PrEP.  Osteoporosis is a disease in which the bones lose minerals and strength with aging. This can result in serious bone fractures or breaks. The risk of osteoporosis can be identified using a bone density scan. Women ages 66 years and over and women at risk for fractures or osteoporosis should discuss screening with their health care providers. Ask your health care provider whether you should take a calcium supplement or vitamin D to reduce the rate of osteoporosis.  Menopause can be associated with physical symptoms and risks. Hormone replacement therapy is available to decrease symptoms and risks. You should talk to your health care provider about whether hormone replacement therapy is right for you.  Use sunscreen. Apply sunscreen liberally and repeatedly throughout the day. You should seek shade when your shadow  is shorter than you. Protect yourself by wearing long sleeves, pants, a wide-brimmed hat, and sunglasses year round, whenever you are outdoors.  Once a month, do a whole body skin exam, using a mirror to look at the skin on your back. Tell your health care provider of new moles, moles that have irregular borders, moles that are larger than a pencil eraser, or moles that have changed in shape or color.  Stay current with required vaccines (immunizations).  Influenza vaccine. All adults should be immunized every year.  Tetanus, diphtheria, and acellular pertussis (Td, Tdap) vaccine. Pregnant women should receive 1 dose of Tdap vaccine during each pregnancy. The dose should be obtained regardless of the length of time since the last dose. Immunization is preferred during the 27th-36th week of gestation. An adult who has not previously received Tdap or who does not know her vaccine status should receive 1 dose of Tdap. This initial dose should be followed by tetanus and diphtheria toxoids (Td) booster doses every 10 years. Adults with an unknown or incomplete history of completing a 3-dose immunization series with Td-containing vaccines should begin or complete a primary immunization series including a Tdap dose. Adults should receive a Td booster every 10 years.  Varicella vaccine. An adult without evidence of immunity to varicella should receive 2 doses or a second dose if she has previously received 1 dose. Pregnant females who do not have evidence of immunity should receive the first dose after pregnancy. This first dose should be obtained before leaving the health care facility. The second dose should be obtained 4-8 weeks after the first dose.  Human papillomavirus (HPV) vaccine. Females aged 13-26 years who have not received the vaccine previously should obtain the 3-dose series. The vaccine is not recommended for use in pregnant females. However, pregnancy testing is not needed before receiving a  dose. If a female is found to be pregnant after receiving a dose, no treatment is needed. In that case, the remaining doses should be delayed until after the pregnancy. Immunization is recommended for any person with an immunocompromised condition through the age of 61 years if she did not get any or all doses earlier. During the 3-dose series, the second  dose should be obtained 4-8 weeks after the first dose. The third dose should be obtained 24 weeks after the first dose and 16 weeks after the second dose.  Zoster vaccine. One dose is recommended for adults aged 20 years or older unless certain conditions are present.  Measles, mumps, and rubella (MMR) vaccine. Adults born before [birth year] generally are considered immune to measles and mumps. Adults born in [birth year] or later should have 1 or more doses of MMR vaccine unless there is a contraindication to the vaccine or there is laboratory evidence of immunity to each of the three diseases. A routine second dose of MMR vaccine should be obtained at least 28 days after the first dose for students attending postsecondary schools, health care workers, or international travelers. People who received inactivated measles vaccine or an unknown type of measles vaccine during 1963-1967 should receive 2 doses of MMR vaccine. People who received inactivated mumps vaccine or an unknown type of mumps vaccine before 1979 and are at high risk for mumps infection should consider immunization with 2 doses of MMR vaccine. For females of childbearing age, rubella immunity should be determined. If there is no evidence of immunity, females who are not pregnant should be vaccinated. If there is no evidence of immunity, females who are pregnant should delay immunization until after pregnancy. Unvaccinated health care workers born before [birth year] who lack laboratory evidence of measles, mumps, or rubella immunity or laboratory confirmation of disease should consider measles and mumps immunization  with 2 doses of MMR vaccine or rubella immunization with 1 dose of MMR vaccine.  Pneumococcal 13-valent conjugate (PCV13) vaccine. When indicated, a person who is uncertain of his immunization history and has no record of immunization should receive the PCV13 vaccine. All adults 12 years of age and older should receive this vaccine. An adult aged 75 years or older who has certain medical conditions and has not been previously immunized should receive 1 dose of PCV13 vaccine. This PCV13 should be followed with a dose of pneumococcal polysaccharide (PPSV23) vaccine. Adults who are at high risk for pneumococcal disease should obtain the PPSV23 vaccine at least 8 weeks after the dose of PCV13 vaccine. Adults older than 68 years of age who have normal immune system function should obtain the PPSV23 vaccine dose at least 1 year after the dose of PCV13 vaccine.  Pneumococcal polysaccharide (PPSV23) vaccine. When PCV13 is also indicated, PCV13 should be obtained first. All adults aged 55 years and older should be immunized. An adult younger than age 15 years who has certain medical conditions should be immunized. Any person who resides in a nursing home or long-term care facility should be immunized. An adult smoker should be immunized. People with an immunocompromised condition and certain other conditions should receive both PCV13 and PPSV23 vaccines. People with human immunodeficiency virus (HIV) infection should be immunized as soon as possible after diagnosis. Immunization during chemotherapy or radiation therapy should be avoided. Routine use of PPSV23 vaccine is not recommended for American Indians, [place] Natives, or people younger than 65 years unless there are medical conditions that require PPSV23 vaccine. When indicated, people who have unknown immunization and have no record of immunization should receive PPSV23 vaccine. One-time revaccination 5 years after the first dose of PPSV23 is recommended for people  aged 19-64 years who have chronic kidney failure, nephrotic syndrome, asplenia, or immunocompromised conditions. People who received 1-2 doses of PPSV23 before age 59 years should receive another dose of PPSV23 vaccine at age 11  years or later if at least 5 years have passed since the previous dose. Doses of PPSV23 are not needed for people immunized with PPSV23 at or after age 10 years.  Meningococcal vaccine. Adults with asplenia or persistent complement component deficiencies should receive 2 doses of quadrivalent meningococcal conjugate (MenACWY-D) vaccine. The doses should be obtained at least 2 months apart. Microbiologists working with certain meningococcal bacteria, [place] recruits, people at risk during an outbreak, and people who travel to or live in countries with a high rate of meningitis should be immunized. A first-year college student up through age 64 years who is living in a residence hall should receive a dose if she did not receive a dose on or after her 16th birthday. Adults who have certain high-risk conditions should receive one or more doses of vaccine.  Hepatitis A vaccine. Adults who wish to be protected from this disease, have certain high-risk conditions, work with hepatitis A-infected animals, work in hepatitis A research labs, or travel to or work in countries with a high rate of hepatitis A should be immunized. Adults who were previously unvaccinated and who anticipate close contact with an international adoptee during the first 60 days after arrival in the [place] from a country with a high rate of hepatitis A should be immunized.  Hepatitis B vaccine. Adults who wish to be protected from this disease, have certain high-risk conditions, may be exposed to blood or other infectious body fluids, are household contacts or sex partners of hepatitis B positive people, are clients or workers in certain care facilities, or travel to or work in countries with a high rate of  hepatitis B should be immunized.  Haemophilus influenzae type b (Hib) vaccine. A previously unvaccinated person with asplenia or sickle cell disease or having a scheduled splenectomy should receive 1 dose of Hib vaccine. Regardless of previous immunization, a recipient of a hematopoietic stem cell transplant should receive a 3-dose series 6-12 months after her successful transplant. Hib vaccine is not recommended for adults with HIV infection. Preventive Services / Frequency Ages 52 to 30 years  Blood pressure check.** / Every 3-5 years.  Lipid and cholesterol check.** / Every 5 years beginning at age 24.  Clinical breast exam.** / Every 3 years for women in their 90s and 4s.  BRCA-related cancer risk assessment.** / For women who have family members with a BRCA-related cancer (breast, ovarian, tubal, or peritoneal cancers).  Pap test.** / Every 2 years from ages 94 through 78. Every 3 years starting at age 27 through age 23 or 61 with a history of 3 consecutive normal Pap tests.  HPV screening.** / Every 3 years from ages 22 through ages 23 to 29 with a history of 3 consecutive normal Pap tests.  Hepatitis C blood test.** / For any individual with known risks for hepatitis C.  Skin self-exam. / Monthly.  Influenza vaccine. / Every year.  Tetanus, diphtheria, and acellular pertussis (Tdap, Td) vaccine.** / Consult your health care provider. Pregnant women should receive 1 dose of Tdap vaccine during each pregnancy. 1 dose of Td every 10 years.  Varicella vaccine.** / Consult your health care provider. Pregnant females who do not have evidence of immunity should receive the first dose after pregnancy.  HPV vaccine. / 3 doses over 6 months, if 20 and younger. The vaccine is not recommended for use in pregnant females. However, pregnancy testing is not needed before receiving a dose.  Measles, mumps, rubella (MMR) vaccine.** / You  need at least 1 dose of MMR if you were born in 1957 or  later. You may also need a 2nd dose. For females of childbearing age, rubella immunity should be determined. If there is no evidence of immunity, females who are not pregnant should be vaccinated. If there is no evidence of immunity, females who are pregnant should delay immunization until after pregnancy.  Pneumococcal 13-valent conjugate (PCV13) vaccine.** / Consult your health care provider.  Pneumococcal polysaccharide (PPSV23) vaccine.** / 1 to 2 doses if you smoke cigarettes or if you have certain conditions.  Meningococcal vaccine.** / 1 dose if you are age 3 to 49 years and a Market researcher living in a residence hall, or have one of several medical conditions, you need to get vaccinated against meningococcal disease. You may also need additional booster doses.  Hepatitis A vaccine.** / Consult your health care provider.  Hepatitis B vaccine.** / Consult your health care provider.  Haemophilus influenzae type b (Hib) vaccine.** / Consult your health care provider. Ages 15 to 27 years  Blood pressure check.** / Every year.  Lipid and cholesterol check.** / Every 5 years beginning at age 46 years.  Lung cancer screening. / Every year if you are aged 73-80 years and have a 30-pack-year history of smoking and currently smoke or have quit within the past 15 years. Yearly screening is stopped once you have quit smoking for at least 15 years or develop a health problem that would prevent you from having lung cancer treatment.  Clinical breast exam.** / Every year after age 57 years.  BRCA-related cancer risk assessment.** / For women who have family members with a BRCA-related cancer (breast, ovarian, tubal, or peritoneal cancers).  Mammogram.** / Every year beginning at age 12 years and continuing for as long as you are in good health. Consult with your health care provider.  Pap test.** / Every 3 years starting at age 73 years through age 16 or 57 years with a history of 3  consecutive normal Pap tests.  HPV screening.** / Every 3 years from ages 50 years through ages 39 to 59 years with a history of 3 consecutive normal Pap tests.  Fecal occult blood test (FOBT) of stool. / Every year beginning at age 37 years and continuing until age 86 years. You may not need to do this test if you get a colonoscopy every 10 years.  Flexible sigmoidoscopy or colonoscopy.** / Every 5 years for a flexible sigmoidoscopy or every 10 years for a colonoscopy beginning at age 39 years and continuing until age 47 years.  Hepatitis C blood test.** / For all people born from [birth year] through 1963 and any individual with known risks for hepatitis C.  Skin self-exam. / Monthly.  Influenza vaccine. / Every year.  Tetanus, diphtheria, and acellular pertussis (Tdap/Td) vaccine.** / Consult your health care provider. Pregnant women should receive 1 dose of Tdap vaccine during each pregnancy. 1 dose of Td every 10 years.  Varicella vaccine.** / Consult your health care provider. Pregnant females who do not have evidence of immunity should receive the first dose after pregnancy.  Zoster vaccine.** / 1 dose for adults aged 80 years or older.  Measles, mumps, rubella (MMR) vaccine.** / You need at least 1 dose of MMR if you were born in 1957 or later. You may also need a second dose. For females of childbearing age, rubella immunity should be determined. If there is no evidence of immunity, females who are not  pregnant should be vaccinated. If there is no evidence of immunity, females who are pregnant should delay immunization until after pregnancy.  Pneumococcal 13-valent conjugate (PCV13) vaccine.** / Consult your health care provider.  Pneumococcal polysaccharide (PPSV23) vaccine.** / 1 to 2 doses if you smoke cigarettes or if you have certain conditions.  Meningococcal vaccine.** / Consult your health care provider.  Hepatitis A vaccine.** / Consult your health care provider.  Hepatitis B  vaccine.** / Consult your health care provider.  Haemophilus influenzae type b (Hib) vaccine.** / Consult your health care provider. Ages 60 years and over  Blood pressure check.** / Every year.  Lipid and cholesterol check.** / Every 5 years beginning at age 4 years.  Lung cancer screening. / Every year if you are aged 41-80 years and have a 30-pack-year history of smoking and currently smoke or have quit within the past 15 years. Yearly screening is stopped once you have quit smoking for at least 15 years or develop a health problem that would prevent you from having lung cancer treatment.  Clinical breast exam.** / Every year after age 77 years.  BRCA-related cancer risk assessment.** / For women who have family members with a BRCA-related cancer (breast, ovarian, tubal, or peritoneal cancers).  Mammogram.** / Every year beginning at age 54 years and continuing for as long as you are in good health. Consult with your health care provider.  Pap test.** / Every 3 years starting at age 16 years through age 48 or 37 years with 3 consecutive normal Pap tests. Testing can be stopped between 65 and 70 years with 3 consecutive normal Pap tests and no abnormal Pap or HPV tests in the past 10 years.  HPV screening.** / Every 3 years from ages 23 years through ages 65 or 36 years with a history of 3 consecutive normal Pap tests. Testing can be stopped between 65 and 70 years with 3 consecutive normal Pap tests and no abnormal Pap or HPV tests in the past 10 years.  Fecal occult blood test (FOBT) of stool. / Every year beginning at age 11 years and continuing until age 29 years. You may not need to do this test if you get a colonoscopy every 10 years.  Flexible sigmoidoscopy or colonoscopy.** / Every 5 years for a flexible sigmoidoscopy or every 10 years for a colonoscopy beginning at age 85 years and continuing until age 81 years.  Hepatitis C blood test.** / For all people born from [birth year] through  1965 and any individual with known risks for hepatitis C.  Osteoporosis screening.** / A one-time screening for women ages 26 years and over and women at risk for fractures or osteoporosis.  Skin self-exam. / Monthly.  Influenza vaccine. / Every year.  Tetanus, diphtheria, and acellular pertussis (Tdap/Td) vaccine.** / 1 dose of Td every 10 years.  Varicella vaccine.** / Consult your health care provider.  Zoster vaccine.** / 1 dose for adults aged 4 years or older.  Pneumococcal 13-valent conjugate (PCV13) vaccine.** / Consult your health care provider.  Pneumococcal polysaccharide (PPSV23) vaccine.** / 1 dose for all adults aged 59 years and older.  Meningococcal vaccine.** / Consult your health care provider.  Hepatitis A vaccine.** / Consult your health care provider.  Hepatitis B vaccine.** / Consult your health care provider.  Haemophilus influenzae type b (Hib) vaccine.** / Consult your health care provider. ** Family history and personal history of risk and conditions may change your health care provider's recommendations.   This information  is not intended to replace advice given to you by your health care provider. Make sure you discuss any questions you have with your health care provider.   Document Released: 09/12/2001 Document Revised: 08/07/2014 Document Reviewed: 12/12/2010 Elsevier Interactive Patient Education [employer].

## 2016-03-14 NOTE — Assessment & Plan Note (Signed)
Has followed with neurology and is stable

## 2016-03-14 NOTE — Progress Notes (Signed)
Pre visit review using our clinic review tool, if applicable. No additional management support is needed unless otherwise documented below in the visit note. 

## 2016-03-15 NOTE — Progress Notes (Signed)
Emily Mitchell was seen today in the movement disorders clinic for neurologic consultation at the request of Penni Homans, MD.  The consultation is for the evaluation of tremor.  Pt states that tremor started 10 years ago and it started in the R hand but seems that it is in the L hand now as well as in the mouth.  It is in the legs as well.  She states that she notes it most when she uses the limbs and it is worse when she is upset.  She has to use a straw with drinking.  She has trouble eating soup on a spoon and sometimes has to drink the soup.  Sometimes she has to eat peas with her fingers.  Her maternal grandma had tremor all her life.  Saw Dr. Erling Cruz years ago and dx with "tremor" per pt (no notes available) and no medication given.  Pt worried she has PD.  02/04/14 update:  Pt is following up today.  She is doing about the same.  She feels that tremor is actually doing well today.  She does not want any medication for it.  She does complain about some neck pain, but it is not disabling.  She has had no falls.  No urinary incontinence.  No loss of bowel control.  She thought she was able to discontinue her Coumadin this month, but that has changed.  She does have a history of pulmonary embolism.  02/16/15 update:  The patient is following up in regards to her tremor.  This patient is accompanied in the office by her granddaughter who supplements the history.  The patient feels that her tremor has been stable and even thinks that she is doing better with time;  She doesn't drink coffee but goes through a 2L of diet dr pepper every few days.  She denies any new medical problems.  She was dx with macular degeneration since last visit and is getting a shot in her eye (right).  03/16/16 update:  The patient is following up today regarding her essential tremor.  I have reviewed prior records made available to me.   She is on no treatment for essential tremor.  She is no longer on metoprolol.  She  accidentally discontinued that and did fine with her blood pressure, so was never restarted.  Tremor fortunately has been well controlled.   Still drinking several diet doctor pepper per day.  She saw Dr. Charlett Blake just recently and her synthroid was dropped from 137 to 120mcg and her valsartan was decreased b/c her BP had lowered.    ALLERGIES:   Allergies  Allergen Reactions  . Niaspan [Niacin Er] Nausea And Vomiting and Swelling    Swelling in mouth  . Pantoprazole     Mouth sores  . Bupropion Other (See Comments)    Uncontrollable shakes  . Penicillins Hives  . Statins   . Sulfonamide Derivatives Hives  . Apple Rash  . Banana Rash  . Milk-Related Compounds Rash    CURRENT MEDICATIONS:  Current Outpatient Prescriptions on File Prior to Visit  Medication Sig Dispense Refill  . acetaminophen (TYLENOL) 500 MG tablet Take 500 mg by mouth every 6 (six) hours as needed.    Marland Kitchen albuterol (PROVENTIL HFA;VENTOLIN HFA) 108 (90 BASE) MCG/ACT inhaler Inhale 2 puffs into the lungs every 4 (four) hours as needed for wheezing.    . calcium citrate-vitamin D (CITRACAL+D) 315-200 MG-UNIT per tablet Take 1 tablet by mouth daily.     Marland Kitchen  Cholecalciferol (D3 MAXIMUM STRENGTH) 5000 UNITS capsule Take 5,000 Units by mouth daily.    . Choline Fenofibrate (TRILIPIX) 135 MG capsule Take 1 capsule (135 mg total) by mouth daily. 90 capsule 1  . citalopram (CELEXA) 20 MG tablet Take 2 tablets (40 mg total) by mouth daily. 180 tablet 1  . clonazePAM (KLONOPIN) 1 MG tablet Take 1 tablet (1 mg total) by mouth 3 (three) times daily as needed for anxiety. 90 tablet 2  . Cyanocobalamin (VITAMIN B-12 PO) Take by mouth.    . esomeprazole (NEXIUM) 40 MG capsule Take 1 capsule (40 mg total) by mouth daily at 12 noon. 90 capsule 1  . ezetimibe (ZETIA) 10 MG tablet Take 1 tablet (10 mg total) by mouth daily. 90 tablet 1  . furosemide (LASIX) 20 MG tablet Take 1 tablet (20 mg total) by mouth daily as needed for edema. 30 tablet 3   . levalbuterol (XOPENEX) 1.25 MG/0.5ML nebulizer solution Take 1.25 mg by nebulization every 4 (four) hours as needed for wheezing. 30 each 1  . montelukast (SINGULAIR) 10 MG tablet Take 1 tablet (10 mg total) by mouth at bedtime. Does not take Wed & Sun 90 tablet 1  . nitroGLYCERIN (NITROSTAT) 0.4 MG SL tablet Place 1 tablet (0.4 mg total) under the tongue every 5 (five) minutes as needed for chest pain. 25 tablet 3  . ranitidine (ZANTAC) 300 MG tablet Take 1 tablet (300 mg total) by mouth daily with breakfast. 90 tablet 1  . traMADol (ULTRAM) 50 MG tablet Take 1 tablet (50 mg total) by mouth every 8 (eight) hours as needed for moderate pain or severe pain. 90 tablet 0  . valsartan (DIOVAN) 80 MG tablet Take 1 tablet (80 mg total) by mouth daily. (Patient taking differently: Take 40 mg by mouth daily. ) 90 tablet 1  . warfarin (COUMADIN) 5 MG tablet TAKE AS DIRECTED 90 tablet 1   No current facility-administered medications on file prior to visit.     PAST MEDICAL HISTORY:   Past Medical History:  Diagnosis Date  . Anemia 07/05/2014  . Anxiety and depression 12/14/2008   Qualifier: Diagnosis of  By: [name] LPN, Almyra Free    . Arthritis   . Asthma   . Atypical chest pain 05/23/2015  . Carotid artery occlusion   . CHF (congestive heart failure) ([place])   . Chicken pox as a child  . Chronic kidney disease    Bright's Disease at age 43   . COPD (chronic obstructive pulmonary disease) ([place])   . GERD (gastroesophageal reflux disease)   . H. pylori infection 10/19/2013  . Hyperglycemia 03/14/2016  . Hyperlipidemia   . Hypertension   . Hypothyroidism   . Left hip pain 05/23/2015  . Low back pain 09/19/2015  . Macular degeneration of both eyes 12/16/2015   Right eye is wet Left Eye is dry Shots every 11 to 12 weeks in right eye at opthamologist office  . Medicare annual wellness visit, subsequent 02/21/2015  . Mumps 70 yrs old  . Preventative health care 03/14/2016  . Pulmonary emboli ([place])  05/26/2013  . Tremor   . UTI (lower urinary tract infection) 10/19/2013    PAST SURGICAL HISTORY:   Past Surgical History:  Procedure Laterality Date  . ABDOMINAL HYSTERECTOMY  1984  . CAROTID ENDARTERECTOMY Right 05/10/07   cea  . CATARACT EXTRACTION Left   . EYE SURGERY     b/l  . knees replaced  2005 and 2011   both  knees  . WISDOM TOOTH EXTRACTION      SOCIAL HISTORY:   Social History   Social History  . Marital status: Divorced    Spouse name: N/A  . Number of children: 2  . Years of education: N/A   Occupational History  . Not on file.   Social History Main Topics  . Smoking status: Former Smoker    Packs/day: 2.00    Years: 30.00    Types: Cigarettes    Quit date: 12/03/1994  . Smokeless tobacco: Never Used  . Alcohol use No  . Drug use: No  . Sexual activity: No     Comment: lives with son and friend, no dietary restrictions   Other Topics Concern  . Not on file   Social History Narrative   epworth sleepiness scale = 11 (06/15/2015)    FAMILY HISTORY:   Family Status  Relation Status  . Mother Deceased at age 57   CHF, renal failure  . Sister Alive  . Father Deceased at age 50   unknown cause of death (burned)  . Daughter Alive   healthy  . Son Alive   healthy  . Maternal Grandmother Deceased at age 4  . Maternal Grandfather Deceased at age 33  . Paternal Grandmother Deceased  . Paternal Grandfather Deceased  . Sister Alive  . Sister Alive    ROS:  A complete 10 system review of systems was obtained and was unremarkable apart from what is mentioned above.  PHYSICAL EXAMINATION:    VITALS:   Vitals:   03/16/16 1533  BP: 118/68  BP Location: Left Arm  Patient Position: Sitting  Cuff Size: Normal  Pulse: 76  Weight: 186 lb (84.4 kg)  Height: 5\' 3"  (1.6 m)    GEN:  The patient appears stated age and is in NAD. HEENT:  Normocephalic, atraumatic.  The mucous membranes are moist. The superficial temporal arteries are without  ropiness or tenderness.  Neurological examination:  Orientation: The patient is alert and oriented x3. Fund of knowledge is appropriate.  Recent and remote memory are intact.  Attention and concentration are normal.    Able to name objects and repeat phrases. Cranial nerves: There is good facial symmetry.  The speech is fluent and clear. Soft palate rises symmetrically and there is no tongue deviation. Hearing is intact to conversational tone. Sensation: Sensation is intact to light and pinprick throughout (facial, trunk, extremities). Vibration is intact at the bilateral big toe. There is no extinction with double simultaneous stimulation. There is no sensory dermatomal level identified. Motor: Strength is 5/5 in the bilateral upper and lower extremities.   Shoulder shrug is equal and symmetric.  There is no pronator drift.   Movement examination: Tone: There is normal tone in the bilateral upper extremities.  The tone in the lower extremities is normal.  Abnormal movements: There is a chin tremor.  There is no resting tremor today but she has had it previously.  There is postural tremor, R more than L.   Tremor is slightly worse when holding a weight than when there is nothing in the hand.   Coordination:  There is no decremation with RAM's with any form of RAM's including alternating supination and pronation of the forearm, hand opening and closing, finger taps, heel taps and toe taps. Gait and Station: The patient has a slightly wide based gait.      ASSESSMENT/PLAN:  1.  Essential tremor.  -The pt has longstanding essential tremor that  is somewhat asymmetric.  We did discuss that she could not have beta blockers because of her COPD and that we would have to cautiously use primidone because of her coumadin.  She was just relieved to hear that she did not have PD.    -Told to try and make her diet dr pepper a caffeine free variant.  Talked about this last few visits now.   2.   Hyperreflexia  -She has no evidence of myelopathy but the hyperreflexia certainly could be coming from degenerative changes in the neck.  She does have some neck pain. In the end, she decided that she would not have any surgery right now no matter what the findings so I decided not to image.  IF neck pain becomes a big issue, if she begins to fall or has loss of bladder or bowel control, I would suggest that this is re-visited. 3.  Followup in one year, sooner should new neurologic issues arise.

## 2016-03-16 ENCOUNTER — Ambulatory Visit (INDEPENDENT_AMBULATORY_CARE_PROVIDER_SITE_OTHER): Payer: Commercial Managed Care - HMO | Admitting: Neurology

## 2016-03-16 ENCOUNTER — Other Ambulatory Visit: Payer: Self-pay | Admitting: Family Medicine

## 2016-03-16 ENCOUNTER — Encounter: Payer: Self-pay | Admitting: Neurology

## 2016-03-16 VITALS — BP 118/68 | HR 76 | Ht 63.0 in | Wt 186.0 lb

## 2016-03-16 DIAGNOSIS — G250 Essential tremor: Secondary | ICD-10-CM

## 2016-03-16 MED ORDER — LEVOTHYROXINE SODIUM 125 MCG PO TABS: 125.0000 ug | ORAL_TABLET | Freq: Every day | ORAL | 1 refills | Status: DC

## 2016-03-21 LAB — BASIC METABOLIC PANEL
BUN: 34 mg/dL — AB (ref 4–21)
Creatinine: 1.7 mg/dL — AB (ref 0.5–1.1)
Potassium: 4.2 mmol/L (ref 3.4–5.3)
SODIUM: 142 mmol/L (ref 137–147)

## 2016-03-21 LAB — HEPATIC FUNCTION PANEL
ALT: 13 U/L (ref 7–35)
AST: 20 U/L (ref 13–35)
Bilirubin, Total: 0.4 mg/dL

## 2016-03-30 ENCOUNTER — Ambulatory Visit (INDEPENDENT_AMBULATORY_CARE_PROVIDER_SITE_OTHER): Payer: Commercial Managed Care - HMO | Admitting: *Deleted

## 2016-03-30 DIAGNOSIS — Z86711 Personal history of pulmonary embolism: Secondary | ICD-10-CM

## 2016-03-30 DIAGNOSIS — Z23 Encounter for immunization: Secondary | ICD-10-CM

## 2016-03-30 LAB — POCT INR: INR: 2.7

## 2016-03-30 NOTE — Patient Instructions (Signed)
Continue taking 1 tablet every day, except take 1/2 tablet on Monday, Thursday, and Saturday. Recheck INR in 4 weeks. 

## 2016-03-30 NOTE — Progress Notes (Signed)
Pre visit review using our clinic review tool, if applicable. No additional management support is needed unless otherwise documented below in the visit note.  INR today 2.7. Pt findings negative.   Continue taking 1 tablet every day, except take 1/2 tablet on Monday, Thursday, and Saturday. Recheck INR in 4 weeks.  Next appointment: 04/27/16  Dorrene German, RN

## 2016-04-11 ENCOUNTER — Other Ambulatory Visit: Payer: Self-pay | Admitting: Family Medicine

## 2016-04-11 DIAGNOSIS — E785 Hyperlipidemia, unspecified: Secondary | ICD-10-CM

## 2016-04-14 ENCOUNTER — Encounter: Payer: Self-pay | Admitting: Family Medicine

## 2016-04-27 ENCOUNTER — Ambulatory Visit (INDEPENDENT_AMBULATORY_CARE_PROVIDER_SITE_OTHER): Payer: Commercial Managed Care - HMO | Admitting: Behavioral Health

## 2016-04-27 DIAGNOSIS — Z86711 Personal history of pulmonary embolism: Secondary | ICD-10-CM

## 2016-04-27 LAB — POCT INR: INR: 2.1

## 2016-04-27 NOTE — Patient Instructions (Signed)
Continue taking 1 tablet every day, except take 1/2 tablet on Monday, Thursday, and Saturday. Recheck INR in 4 weeks.

## 2016-04-27 NOTE — Progress Notes (Signed)
Pre visit review using our clinic review tool, if applicable. No additional management support is needed unless otherwise documented below in the visit note.  Patient presents in office for INR check. Reviewed medication and current regimen with the patient. She did not report any positive findings. Today's reading is 2.1.  Advised patient to continue taking 1 tablet every day, except take 1/2 tablet on Monday, Thursday, and Saturday. Recheck INR in 4 weeks.  She verbalized understanding. Next appointment scheduled for 05/25/16 at 2:00 PM.

## 2016-05-25 ENCOUNTER — Ambulatory Visit (INDEPENDENT_AMBULATORY_CARE_PROVIDER_SITE_OTHER): Payer: Commercial Managed Care - HMO | Admitting: Behavioral Health

## 2016-05-25 DIAGNOSIS — Z86711 Personal history of pulmonary embolism: Secondary | ICD-10-CM | POA: Diagnosis not present

## 2016-05-25 LAB — POCT INR: INR: 3.1

## 2016-05-25 NOTE — Progress Notes (Signed)
Pre visit review using our clinic review tool, if applicable. No additional management support is needed unless otherwise documented below in the visit note.  Pt presented to clinic for INR check. Medication and regimen reviewed. INR result 3.1. Dr.Blyth notified. Orders received for pt to cont current regimen and follow up in 4 weeks. Pt verbalizes understanding.   Next appt 06/27/16 @230 .  Per Dr.Blyth: Continue taking 1 tablet every day, except take 1/2 tablet on Monday, Thursday, and Saturday. Recheck INR in 4 weeks.

## 2016-05-25 NOTE — Patient Instructions (Signed)
Per Dr.Blyth: Continue taking 1 tablet every day, except take 1/2 tablet on Monday, Thursday, and Saturday. Recheck INR in 4 weeks.

## 2016-06-27 ENCOUNTER — Ambulatory Visit: Payer: Commercial Managed Care - HMO

## 2016-06-29 ENCOUNTER — Other Ambulatory Visit: Payer: Self-pay | Admitting: Family Medicine

## 2016-06-29 ENCOUNTER — Ambulatory Visit (INDEPENDENT_AMBULATORY_CARE_PROVIDER_SITE_OTHER): Payer: Commercial Managed Care - HMO | Admitting: Behavioral Health

## 2016-06-29 DIAGNOSIS — Z86711 Personal history of pulmonary embolism: Secondary | ICD-10-CM

## 2016-06-29 DIAGNOSIS — K219 Gastro-esophageal reflux disease without esophagitis: Secondary | ICD-10-CM

## 2016-06-29 LAB — POCT INR: INR: 3

## 2016-06-29 NOTE — Progress Notes (Signed)
Pre visit review using our clinic review tool, if applicable. No additional management support is needed unless otherwise documented below in the visit note.  Patient came in clinic for INR check. Medication & regimen reviewed with the patient. She reported no changes or positive findings. Today's reading was 3.0.  Per Dr.Blyth: Continue taking 1 tablet every day, except take 1/2 tablet on Monday, Thursday, and Saturday. Recheck INR in 4 weeks.  Informed patient of the provider's instructions. She verbalized understanding. Next appointment scheduled for 07/27/16 at 2:00 PM.

## 2016-06-29 NOTE — Patient Instructions (Signed)
Per Dr.Blyth: Continue taking 1 tablet every day, except take 1/2 tablet on Monday, Thursday, and Saturday. Recheck INR in 4 weeks.

## 2016-07-27 ENCOUNTER — Ambulatory Visit (INDEPENDENT_AMBULATORY_CARE_PROVIDER_SITE_OTHER): Payer: Commercial Managed Care - HMO | Admitting: Behavioral Health

## 2016-07-27 DIAGNOSIS — Z86711 Personal history of pulmonary embolism: Secondary | ICD-10-CM

## 2016-07-27 LAB — POCT INR: INR: 3.3

## 2016-07-27 NOTE — Patient Instructions (Signed)
Per Dr.Blyth: Take Coumadin 2.5 MG (1/2 tablet) on Monday, Wednesday, Friday & Saturday and then take Coumadin 5 MG (1 tablet) on Tuesday, Thursday & Sunday. Recheck INR in 2-3 weeks.

## 2016-07-27 NOTE — Progress Notes (Signed)
Pre visit review using our clinic review tool, if applicable. No additional management support is needed unless otherwise documented below in the visit note.  Patient presents in clinic for INR check. Reviewed medication & current regimen with the patient. She reported no positive findings. Today's INR reading was 3.3.  Per Dr.Blyth: Take Coumadin 2.5 MG (1/2 tablet) on Monday, Wednesday, Friday & Saturday and then take Coumadin 5 MG (1 tablet) on Tuesday, Thursday & Sunday. Recheck INR in 2-3 weeks.  Informed patient of the provider's recommendations. She verbalized understanding and did not have any further concerns before leaving the nurse visit.  Next appointment scheduled for 08/17/16 at 2:00 PM.

## 2016-08-10 ENCOUNTER — Other Ambulatory Visit: Payer: Self-pay

## 2016-08-10 DIAGNOSIS — N6320 Unspecified lump in the left breast, unspecified quadrant: Secondary | ICD-10-CM

## 2016-08-10 NOTE — Progress Notes (Signed)
Orders have been faxed over to [facility]  [facility] [address] [place] [address]  Fax# [phone]  [facility]

## 2016-08-11 ENCOUNTER — Other Ambulatory Visit: Payer: Self-pay

## 2016-08-11 DIAGNOSIS — N6320 Unspecified lump in the left breast, unspecified quadrant: Secondary | ICD-10-CM

## 2016-08-15 DIAGNOSIS — N189 Chronic kidney disease, unspecified: Secondary | ICD-10-CM | POA: Diagnosis not present

## 2016-08-15 DIAGNOSIS — N6322 Unspecified lump in the left breast, upper inner quadrant: Secondary | ICD-10-CM | POA: Diagnosis not present

## 2016-08-15 DIAGNOSIS — N6320 Unspecified lump in the left breast, unspecified quadrant: Secondary | ICD-10-CM | POA: Diagnosis not present

## 2016-08-15 DIAGNOSIS — N183 Chronic kidney disease, stage 3 (moderate): Secondary | ICD-10-CM | POA: Diagnosis not present

## 2016-08-15 LAB — HM MAMMOGRAPHY

## 2016-08-17 ENCOUNTER — Ambulatory Visit: Payer: Commercial Managed Care - HMO

## 2016-08-21 DIAGNOSIS — H353121 Nonexudative age-related macular degeneration, left eye, early dry stage: Secondary | ICD-10-CM | POA: Diagnosis not present

## 2016-08-21 DIAGNOSIS — Z961 Presence of intraocular lens: Secondary | ICD-10-CM | POA: Diagnosis not present

## 2016-08-21 DIAGNOSIS — H353211 Exudative age-related macular degeneration, right eye, with active choroidal neovascularization: Secondary | ICD-10-CM | POA: Diagnosis not present

## 2016-08-21 DIAGNOSIS — H04123 Dry eye syndrome of bilateral lacrimal glands: Secondary | ICD-10-CM | POA: Diagnosis not present

## 2016-08-22 ENCOUNTER — Encounter: Payer: Self-pay | Admitting: Family Medicine

## 2016-08-22 ENCOUNTER — Ambulatory Visit (INDEPENDENT_AMBULATORY_CARE_PROVIDER_SITE_OTHER): Payer: Medicare HMO

## 2016-08-22 DIAGNOSIS — Z7901 Long term (current) use of anticoagulants: Secondary | ICD-10-CM

## 2016-08-22 LAB — POCT INR: INR: 2.1

## 2016-08-22 NOTE — Patient Instructions (Signed)
Per Dr.Blyth: Take Coumadin 2.5 MG (1/2 tablet) on Monday, Wednesday, Friday & Saturday and then take Coumadin 5 MG (1 tablet) on Tuesday, Thursday & Sunday. Recheck INR in 1 month. Patient will return on date of appointment with Dr. Charlett Blake 09/15/15. Ok per Dr. Charlett Blake

## 2016-08-22 NOTE — Progress Notes (Signed)
Pre visit review using our clinic tool,if applicable. No additional management support is needed unless otherwise documented below in the visit note.   Patient in for INR check per order from Dr. Charlett Blake. INR = 2.1. Goal = 2.0-3.0.  Per Dr. Charlett Blake patient has appointment to ee her on 09/14/16 will recheck INR at that time.  Patient to continue taking Coumadin 5mg  Sunday,Tuesday and Thursday. 2.5mg  on Monday Wednesday, Friday and Saturday.

## 2016-08-23 ENCOUNTER — Telehealth: Payer: Self-pay | Admitting: Family Medicine

## 2016-08-23 NOTE — Telephone Encounter (Signed)
Spoke with patient regarding awv. Patient stated that she would like for me to give a call back at a later time.

## 2016-09-12 NOTE — Telephone Encounter (Signed)
Pt scheduled AWV 09/14/16 after PCP appt.

## 2016-09-13 NOTE — Progress Notes (Signed)
Pre visit review using our clinic review tool, if applicable. No additional management support is needed unless otherwise documented below in the visit note. 

## 2016-09-13 NOTE — Progress Notes (Signed)
Subjective:   Emily Mitchell is a 71 y.o. female who presents for Medicare Annual (Subsequent) preventive examination.  Review of Systems:  No ROS.  Medicare Wellness Visit.  Cardiac Risk Factors include: advanced age (>36men, >32 women);dyslipidemia;hypertension;obesity (BMI >30kg/m2)  Sleep patterns: no sleep issues and gets up 1-3 times nightly to void.   Home Safety/Smoke Alarms:   Pt lives with her son and his friend. Feels safe in home. Smoke alarms in place.   Living environment; residence and Firearm Safety: 1-story house/ trailer, number of outside stairs: 1, equipment: Radio producer, Type: [employer], firearms stored safely.  Seat Belt Safety/Bike Helmet: Wears seat belt.   Counseling:   Eye Exam- Pt sees eye dr for macular degeneration. Dental- Pt has top dentures. Gets a cleaning every 6 months.   Female:   Pap- N/A       Mammo- last 08/15/16. BI-RADS CATEGORY3: Probably benign. Left breast US completed showing stable asymmetry. Dexa scan- last 03/24/15. Osteopenia. PT takes calcium and D3 vitamins. CCS- last 07/31/08. No report on file. Done with Dr Gala Romney in [place].  Objective:     Vitals: BP (!) 118/47 (BP Location: Left Arm, Patient Position: Sitting, Cuff Size: Normal)   Pulse 78   Temp 98.3 F (36.8 C) (Oral)   Wt 183 lb (83 kg)   SpO2 97%   BMI 32.42 kg/m   Body mass index is 32.42 kg/m.   Tobacco History  Smoking Status  . Former Smoker  . Packs/day: 2.00  . Years: 30.00  . Types: Cigarettes  . Quit date: 12/03/1994  Smokeless Tobacco  . Never Used     Counseling given: Not Answered   Past Medical History:  Diagnosis Date  . Anemia 07/05/2014  . Anxiety and depression 12/14/2008   Qualifier: Diagnosis of  By: [name] LPN, Almyra Free    . Arthritis   . Asthma   . Atypical chest pain 05/23/2015  . Carotid artery occlusion   . CHF (congestive heart failure) ([place])   . Chicken pox as a child  . Chronic kidney disease    Bright's Disease at  age 60   . COPD (chronic obstructive pulmonary disease) [place]   . GERD (gastroesophageal reflux disease)   . H. pylori infection 10/19/2013  . Hyperglycemia 03/14/2016  . Hyperlipidemia   . Hypertension   . Hypothyroidism   . Left hip pain 05/23/2015  . Low back pain 09/19/2015  . Macular degeneration of both eyes 12/16/2015   Right eye is wet Left Eye is dry Shots every 11 to 12 weeks in right eye at opthamologist office  . Medicare annual wellness visit, subsequent 02/21/2015  . Mumps 71 yrs old  . Preventative health care 03/14/2016  . Pulmonary emboli ([place]) 05/26/2013  . Tremor   . UTI (lower urinary tract infection) 10/19/2013   Past Surgical History:  Procedure Laterality Date  . ABDOMINAL HYSTERECTOMY  1984  . CAROTID ENDARTERECTOMY Right 05/10/07   cea  . CATARACT EXTRACTION Left   . EYE SURGERY     b/l  . knees replaced  2005 and 2011   both knees  . WISDOM TOOTH EXTRACTION     Family History  Problem Relation Age of Onset  . Stroke Mother     mini stroke  . Kidney disease Mother   . Heart failure Mother   . Hypertension Mother   . Diabetes Sister     type 2  . Kidney disease Sister   .  Heart attack Maternal Grandfather   . Hyperlipidemia Sister    History  Sexual Activity  . Sexual activity: No    Comment: lives with son and friend, no dietary restrictions    Outpatient Encounter Prescriptions as of 09/14/2016  Medication Sig  . acetaminophen (TYLENOL) 500 MG tablet Take 500 mg by mouth every 6 (six) hours as needed.  Marland Kitchen albuterol (PROVENTIL HFA;VENTOLIN HFA) 108 (90 BASE) MCG/ACT inhaler Inhale 2 puffs into the lungs every 4 (four) hours as needed for wheezing.  . calcium citrate-vitamin D (CITRACAL+D) 315-200 MG-UNIT per tablet Take 1 tablet by mouth daily.   . Cholecalciferol (D3 MAXIMUM STRENGTH) 5000 UNITS capsule Take 5,000 Units by mouth daily.  . Choline Fenofibrate (FENOFIBRIC ACID) 135 MG CPDR Take 135 mg by mouth daily.  . citalopram (CELEXA) 20 MG  tablet Take 2 tablets (40 mg total) by mouth daily.  . clonazePAM (KLONOPIN) 1 MG tablet Take 1 tablet (1 mg total) by mouth 3 (three) times daily as needed for anxiety.  . Cyanocobalamin (VITAMIN B-12 PO) Take by mouth.  . esomeprazole (NEXIUM) 40 MG capsule Take 1 capsule (40 mg total) by mouth daily at 12 noon.  . ezetimibe (ZETIA) 10 MG tablet Take 1 tablet (10 mg total) by mouth daily.  . furosemide (LASIX) 20 MG tablet Take 1 tablet (20 mg total) by mouth daily as needed for edema.  . levalbuterol (XOPENEX) 1.25 MG/0.5ML nebulizer solution Take 1.25 mg by nebulization every 4 (four) hours as needed for wheezing.  Marland Kitchen levothyroxine (SYNTHROID, LEVOTHROID) 125 MCG tablet Take 1 tablet (125 mcg total) by mouth daily before breakfast.  . montelukast (SINGULAIR) 10 MG tablet TAKE 1 TABLET (10 MG TOTAL) BY MOUTH AT BEDTIME. DOES NOT TAKE WEDNESDAY AND SUNDAY  . nitroGLYCERIN (NITROSTAT) 0.4 MG SL tablet Place 1 tablet (0.4 mg total) under the tongue every 5 (five) minutes as needed for chest pain.  . ranitidine (ZANTAC) 300 MG tablet TAKE 1 TABLET EVERY DAY WITH BREAKFAST  . traMADol (ULTRAM) 50 MG tablet Take 1 tablet (50 mg total) by mouth every 8 (eight) hours as needed for moderate pain or severe pain.  . valsartan (DIOVAN) 80 MG tablet Take 0.5 tablets (40 mg total) by mouth daily.  Marland Kitchen warfarin (COUMADIN) 5 MG tablet TAKE AS DIRECTED  . [DISCONTINUED] Choline Fenofibrate (FENOFIBRIC ACID) 135 MG CPDR TAKE 1 CAPSULE (135 MG TOTAL) BY MOUTH DAILY.  . [DISCONTINUED] citalopram (CELEXA) 20 MG tablet TAKE 2 TABLETS EVERY DAY  . [DISCONTINUED] esomeprazole (NEXIUM) 40 MG capsule TAKE 1 CAPSULE (40 MG TOTAL) BY MOUTH DAILY AT 12 NOON.  . [DISCONTINUED] levothyroxine (SYNTHROID, LEVOTHROID) 125 MCG tablet Take 1 tablet (125 mcg total) by mouth daily before breakfast.  . [DISCONTINUED] ranitidine (ZANTAC) 300 MG tablet TAKE 1 TABLET EVERY DAY WITH BREAKFAST  . [DISCONTINUED] valsartan (DIOVAN) 80 MG  tablet Take 1 tablet (80 mg total) by mouth daily. (Patient taking differently: Take 40 mg by mouth daily. )  . [DISCONTINUED] ZETIA 10 MG tablet TAKE 1 TABLET EVERY DAY   No facility-administered encounter medications on file as of 09/14/2016.     Activities of Daily Living In your present state of health, do you have any difficulty performing the following activities: 09/14/2016 03/14/2016  Hearing? N N  Vision? N N  Difficulty concentrating or making decisions? N N  Walking or climbing stairs? N N  Dressing or bathing? N N  Doing errands, shopping? N N  Preparing Food and eating ? N -  Using the Toilet? N -  In the past six months, have you accidently leaked urine? N -  Do you have problems with loss of bowel control? N -  Managing your Medications? N -  Managing your Finances? N -  Housekeeping or managing your Housekeeping? N -  Some recent data might be hidden    Patient Care Team: Mosie Lukes, MD as PCP - General (Family Medicine) Rigoberto Noel, MD as Consulting Physician (Pulmonary Disease) Donato Heinz, MD as Consulting Physician (Nephrology) Ludwig Clarks, DO as Consulting Physician (Neurology) Noralyn Pick, MD as Consulting Physician (Ophthalmology)    Assessment:    Physical assessment deferred to PCP.  Exercise Activities and Dietary recommendations Current Exercise Habits: The patient does not participate in regular exercise at present, Exercise limited by: None identified  Diet (meal preparation, eat out, water intake, caffeinated beverages, dairy products, fruits and vegetables): on average, 2 meals per day Pt eats 2 meals/day including lunch and dinner. She does not prepare meals. States she eats TV dinners, fruit cups.   Drinks diet soda during the day and sometimes waters. 0-2 bottles of water/day. Goals    . Exercise 3x per week (30 min per time)    . Increase water intake      Fall Risk Fall Risk  09/14/2016 03/16/2016 03/14/2016 02/09/2015  12/07/2014  Falls in the past year? No No Yes No No  Number falls in past yr: - - 2 or more - -  Injury with Fall? - - No - -  Risk Factor Category  - - High Fall Risk - -  Risk for fall due to : Impaired balance/gait - - - -   Depression Screen PHQ 2/9 Scores 09/14/2016 03/14/2016 02/09/2015 12/07/2014  PHQ - 2 Score 0 0 0 0     Cognitive Function MMSE - Mini Mental State Exam 09/14/2016  Orientation to time 5  Orientation to Place 5  Registration 3  Attention/ Calculation 5  Recall 3  Language- name 2 objects 2  Language- repeat 1  Language- follow 3 step command 3  Language- read & follow direction 1  Write a sentence 1  Copy design 1  Total score 30        Immunization History  Administered Date(s) Administered  . Influenza Split 04/29/2013  . Influenza, High Dose Seasonal PF 03/30/2016  . Influenza,inj,Quad PF,36+ Mos 03/26/2014, 05/17/2015  . Pneumococcal Conjugate-13 04/29/2013  . Pneumococcal Polysaccharide-23 04/30/2014  . Tdap 05/26/2013  . Zoster 07/31/2012   Screening Tests Health Maintenance  Topic Date Due  . COLONOSCOPY  07/31/2018  . MAMMOGRAM  08/15/2018  . TETANUS/TDAP  05/27/2023  . INFLUENZA VACCINE  Completed  . DEXA SCAN  Completed  . ZOSTAVAX  Completed  . Hepatitis C Screening  Completed  . PNA vac Low Risk Adult  Completed      Plan:   Follow-up w/ PCP as directed. Dental resource list provided.   Records release faxed to Dr Gala Romney with [facility].   Eat heart healthy diet (full of fruits, vegetables, whole grains, lean protein, water--limit salt, fat, and sugar intake) and increase physical activity as tolerated.  During the course of the visit the patient was educated and counseled about the following appropriate screening and preventive services:   Vaccines to include Pneumoccal, Influenza, Hepatitis B, Td, Zostavax, HCV  Cardiovascular Disease  Colorectal cancer screening  Bone density screening  Diabetes  screening  Glaucoma screening  Mammography/PAP  Nutrition counseling  Patient Instructions (the written plan) was given to the patient.   Ree Edman, RN  09/14/2016   RN AWV note reviewed. Agree with documention and plan.  Penni Homans, MD

## 2016-09-14 ENCOUNTER — Ambulatory Visit (INDEPENDENT_AMBULATORY_CARE_PROVIDER_SITE_OTHER): Payer: Medicare HMO | Admitting: Family Medicine

## 2016-09-14 ENCOUNTER — Encounter: Payer: Self-pay | Admitting: Family Medicine

## 2016-09-14 VITALS — BP 118/47 | HR 78 | Temp 98.3°F | Ht 63.0 in | Wt 183.0 lb

## 2016-09-14 DIAGNOSIS — Z0000 Encounter for general adult medical examination without abnormal findings: Secondary | ICD-10-CM

## 2016-09-14 DIAGNOSIS — E785 Hyperlipidemia, unspecified: Secondary | ICD-10-CM

## 2016-09-14 DIAGNOSIS — I5032 Chronic diastolic (congestive) heart failure: Secondary | ICD-10-CM

## 2016-09-14 DIAGNOSIS — E039 Hypothyroidism, unspecified: Secondary | ICD-10-CM

## 2016-09-14 DIAGNOSIS — Z7901 Long term (current) use of anticoagulants: Secondary | ICD-10-CM | POA: Diagnosis not present

## 2016-09-14 DIAGNOSIS — R739 Hyperglycemia, unspecified: Secondary | ICD-10-CM

## 2016-09-14 DIAGNOSIS — I10 Essential (primary) hypertension: Secondary | ICD-10-CM

## 2016-09-14 DIAGNOSIS — K219 Gastro-esophageal reflux disease without esophagitis: Secondary | ICD-10-CM

## 2016-09-14 DIAGNOSIS — D649 Anemia, unspecified: Secondary | ICD-10-CM | POA: Diagnosis not present

## 2016-09-14 DIAGNOSIS — E782 Mixed hyperlipidemia: Secondary | ICD-10-CM | POA: Diagnosis not present

## 2016-09-14 DIAGNOSIS — N189 Chronic kidney disease, unspecified: Secondary | ICD-10-CM | POA: Diagnosis not present

## 2016-09-14 LAB — POCT INR: INR: 1.6

## 2016-09-14 MED ORDER — ESOMEPRAZOLE MAGNESIUM 40 MG PO CPDR: 40.0000 mg | DELAYED_RELEASE_CAPSULE | Freq: Every day | ORAL | 1 refills | Status: DC

## 2016-09-14 MED ORDER — EZETIMIBE 10 MG PO TABS: 10.0000 mg | ORAL_TABLET | Freq: Every day | ORAL | 1 refills | Status: DC

## 2016-09-14 MED ORDER — CITALOPRAM HYDROBROMIDE 20 MG PO TABS: 40.0000 mg | ORAL_TABLET | Freq: Every day | ORAL | 1 refills | Status: DC

## 2016-09-14 MED ORDER — VALSARTAN 80 MG PO TABS: 40.0000 mg | ORAL_TABLET | Freq: Every day | ORAL | 1 refills | Status: DC

## 2016-09-14 MED ORDER — RANITIDINE HCL 300 MG PO TABS
ORAL_TABLET | ORAL | 1 refills | Status: DC
Start: 2016-09-14 — End: 2017-03-05

## 2016-09-14 MED ORDER — LEVOTHYROXINE SODIUM 125 MCG PO TABS: 125.0000 ug | ORAL_TABLET | Freq: Every day | ORAL | 1 refills | Status: DC

## 2016-09-14 MED ORDER — FENOFIBRIC ACID 135 MG PO CPDR: 135.0000 mg | DELAYED_RELEASE_CAPSULE | Freq: Every day | ORAL | 1 refills | Status: DC

## 2016-09-14 NOTE — Assessment & Plan Note (Signed)
Well controlled, no changes to meds. Encouraged heart healthy diet such as the DASH diet and exercise as tolerated.  °

## 2016-09-14 NOTE — Progress Notes (Signed)
Patient ID: Emily Mitchell, female   DOB: [birth year], 72 y.o.   MRN: [id number]   Subjective:    Patient ID: Emily Mitchell, female    DOB: 02/16/1946, 71 y.o.   MRN: [id number]  Chief Complaint  Patient presents with  . Follow-up  . Hypertension  . Hyperglycemia  . Medicare Wellness   I acted as a Education administrator for Dr. Charlett Blake. [name], RMA  Hypertension  This is a chronic problem. The problem has been gradually improving since onset. Pertinent negatives include no blurred vision, chest pain, headaches, malaise/fatigue, palpitations or shortness of breath.  Hyperglycemia  This is a chronic problem. The problem has been gradually improving. Pertinent negatives include no chest pain, congestion, coughing, fever, headaches, nausea, rash or vomiting.    Patient is in today for folow up on numerous medical concerns including hyperglycemia, hypertension, hypothyroidism, and reflux. She has recently noted some softening of stool and even loose stool not multiple movements. No bloody or tarry stool no fevers, chills. Denies CP/palp/SOB/HA/congestion/fevers/GI or GU c/o. Taking meds as prescribed  Past Medical History:  Diagnosis Date  . Anemia 07/05/2014  . Anxiety and depression 12/14/2008   Qualifier: Diagnosis of  By: [name] LPN, Almyra Free    . Arthritis   . Asthma   . Atypical chest pain 05/23/2015  . Carotid artery occlusion   . CHF (congestive heart failure) ([place])   . Chicken pox as a child  . Chronic kidney disease    Bright's Disease at age 62   . COPD (chronic obstructive pulmonary disease) ([place])   . GERD (gastroesophageal reflux disease)   . H. pylori infection 10/19/2013  . Hyperglycemia 03/14/2016  . Hyperlipidemia   . Hypertension   . Hypothyroidism   . Left hip pain 05/23/2015  . Low back pain 09/19/2015  . Macular degeneration of both eyes 12/16/2015   Right eye is wet Left Eye is dry Shots every 11 to 12 weeks in right eye at opthamologist office  . Medicare annual wellness visit,  subsequent 02/21/2015  . Mumps 71 yrs old  . Preventative health care 03/14/2016  . Pulmonary emboli ([place]) 05/26/2013  . Tremor   . UTI (lower urinary tract infection) 10/19/2013    Past Surgical History:  Procedure Laterality Date  . ABDOMINAL HYSTERECTOMY  1984  . CAROTID ENDARTERECTOMY Right 05/10/07   cea  . CATARACT EXTRACTION Left   . EYE SURGERY     b/l  . knees replaced  2005 and 2011   both knees  . WISDOM TOOTH EXTRACTION      Family History  Problem Relation Age of Onset  . Stroke Mother     mini stroke  . Kidney disease Mother   . Heart failure Mother   . Hypertension Mother   . Diabetes Sister     type 2  . Kidney disease Sister   . Heart attack Maternal Grandfather   . Hyperlipidemia Sister     Social History   Social History  . Marital status: Divorced    Spouse name: N/A  . Number of children: 2  . Years of education: N/A   Occupational History  . Not on file.   Social History Main Topics  . Smoking status: Former Smoker    Packs/day: 2.00    Years: 30.00    Types: Cigarettes    Quit date: 12/03/1994  . Smokeless tobacco: Never Used  . Alcohol use Yes     Comment: rarely, 1/month  . Drug use:  No  . Sexual activity: No     Comment: lives with son and friend, no dietary restrictions   Other Topics Concern  . Not on file   Social History Narrative   epworth sleepiness scale = 11 (06/15/2015)    Outpatient Medications Prior to Visit  Medication Sig Dispense Refill  . acetaminophen (TYLENOL) 500 MG tablet Take 500 mg by mouth every 6 (six) hours as needed.    Marland Kitchen albuterol (PROVENTIL HFA;VENTOLIN HFA) 108 (90 BASE) MCG/ACT inhaler Inhale 2 puffs into the lungs every 4 (four) hours as needed for wheezing.    . calcium citrate-vitamin D (CITRACAL+D) 315-200 MG-UNIT per tablet Take 1 tablet by mouth daily.     . Cholecalciferol (D3 MAXIMUM STRENGTH) 5000 UNITS capsule Take 5,000 Units by mouth daily.    . clonazePAM (KLONOPIN) 1 MG tablet Take  1 tablet (1 mg total) by mouth 3 (three) times daily as needed for anxiety. 90 tablet 2  . Cyanocobalamin (VITAMIN B-12 PO) Take by mouth.    . furosemide (LASIX) 20 MG tablet Take 1 tablet (20 mg total) by mouth daily as needed for edema. 30 tablet 3  . levalbuterol (XOPENEX) 1.25 MG/0.5ML nebulizer solution Take 1.25 mg by nebulization every 4 (four) hours as needed for wheezing. 30 each 1  . montelukast (SINGULAIR) 10 MG tablet TAKE 1 TABLET (10 MG TOTAL) BY MOUTH AT BEDTIME. DOES NOT TAKE WEDNESDAY AND SUNDAY 65 tablet 1  . nitroGLYCERIN (NITROSTAT) 0.4 MG SL tablet Place 1 tablet (0.4 mg total) under the tongue every 5 (five) minutes as needed for chest pain. 25 tablet 3  . traMADol (ULTRAM) 50 MG tablet Take 1 tablet (50 mg total) by mouth every 8 (eight) hours as needed for moderate pain or severe pain. 90 tablet 0  . warfarin (COUMADIN) 5 MG tablet TAKE AS DIRECTED 90 tablet 1  . Choline Fenofibrate (FENOFIBRIC ACID) 135 MG CPDR TAKE 1 CAPSULE (135 MG TOTAL) BY MOUTH DAILY. 90 capsule 1  . citalopram (CELEXA) 20 MG tablet TAKE 2 TABLETS EVERY DAY 180 tablet 1  . esomeprazole (NEXIUM) 40 MG capsule TAKE 1 CAPSULE (40 MG TOTAL) BY MOUTH DAILY AT 12 NOON. 90 capsule 1  . levothyroxine (SYNTHROID, LEVOTHROID) 125 MCG tablet Take 1 tablet (125 mcg total) by mouth daily before breakfast. 90 tablet 1  . ranitidine (ZANTAC) 300 MG tablet TAKE 1 TABLET EVERY DAY WITH BREAKFAST 90 tablet 1  . valsartan (DIOVAN) 80 MG tablet Take 1 tablet (80 mg total) by mouth daily. (Patient taking differently: Take 40 mg by mouth daily. ) 90 tablet 1  . ZETIA 10 MG tablet TAKE 1 TABLET EVERY DAY 90 tablet 1   No facility-administered medications prior to visit.     Allergies  Allergen Reactions  . Niaspan [Niacin Er] Nausea And Vomiting and Swelling    Swelling in mouth  . Pantoprazole     Mouth sores  . Bupropion Other (See Comments)    Uncontrollable shakes  . Penicillins Hives  . Statins   .  Sulfonamide Derivatives Hives  . Apple Rash  . Banana Rash  . Milk-Related Compounds Rash    Review of Systems  Constitutional: Negative for fever and malaise/fatigue.  HENT: Negative for congestion.   Eyes: Negative for blurred vision.  Respiratory: Negative for cough and shortness of breath.   Cardiovascular: Negative for chest pain, palpitations and leg swelling.  Gastrointestinal: Positive for diarrhea. Negative for blood in stool, melena, nausea and vomiting.  Musculoskeletal: Negative for back pain.  Skin: Negative for rash.  Neurological: Negative for loss of consciousness and headaches.       Objective:    Physical Exam  Constitutional: She is oriented to person, place, and time. She appears well-developed and well-nourished. No distress.  HENT:  Head: Normocephalic and atraumatic.  Eyes: Conjunctivae are normal.  Neck: Normal range of motion. No thyromegaly present.  Cardiovascular: Normal rate and regular rhythm.   Pulmonary/Chest: Effort normal and breath sounds normal. She has no wheezes.  Abdominal: Soft. Bowel sounds are normal. There is no tenderness.  Musculoskeletal: Normal range of motion. She exhibits no edema or deformity.  Neurological: She is alert and oriented to person, place, and time.  Skin: Skin is warm and dry. She is not diaphoretic.  Psychiatric: She has a normal mood and affect.    BP (!) 118/47 (BP Location: Left Arm, Patient Position: Sitting, Cuff Size: Normal)   Pulse 78   Temp 98.3 F (36.8 C) (Oral)   Ht 5\' 3"  (1.6 m)   Wt 183 lb (83 kg)   SpO2 97%   BMI 32.42 kg/m  Wt Readings from Last 3 Encounters:  09/14/16 183 lb (83 kg)  03/16/16 186 lb (84.4 kg)  03/14/16 187 lb 12.8 oz (85.2 kg)     Lab Results  Component Value Date   WBC 7.9 09/14/2016   HGB 12.5 09/14/2016   HCT 37.9 09/14/2016   PLT 235.0 09/14/2016   GLUCOSE 76 09/14/2016   CHOL 185 09/14/2016   TRIG 177.0 (H) 09/14/2016   HDL 48.10 09/14/2016   LDLDIRECT  105.0 03/14/2016   LDLCALC 101 (H) 09/14/2016   ALT 13 09/14/2016   AST 20 09/14/2016   NA 137 09/14/2016   K 4.7 09/14/2016   CL 104 09/14/2016   CREATININE 2.02 (H) 09/14/2016   BUN 38 (H) 09/14/2016   CO2 25 09/14/2016   TSH 0.15 (L) 09/14/2016   INR 1.6 09/14/2016   HGBA1C 5.2 09/14/2016    Lab Results  Component Value Date   TSH 0.15 (L) 09/14/2016   Lab Results  Component Value Date   WBC 7.9 09/14/2016   HGB 12.5 09/14/2016   HCT 37.9 09/14/2016   MCV 90.1 09/14/2016   PLT 235.0 09/14/2016   Lab Results  Component Value Date   NA 137 09/14/2016   K 4.7 09/14/2016   CO2 25 09/14/2016   GLUCOSE 76 09/14/2016   BUN 38 (H) 09/14/2016   CREATININE 2.02 (H) 09/14/2016   BILITOT 0.4 09/14/2016   ALKPHOS 39 09/14/2016   AST 20 09/14/2016   ALT 13 09/14/2016   PROT 7.1 09/14/2016   ALBUMIN 4.3 09/14/2016   CALCIUM 9.9 09/14/2016   ANIONGAP 13 03/14/2014   GFR 25.99 (L) 09/14/2016   Lab Results  Component Value Date   CHOL 185 09/14/2016   Lab Results  Component Value Date   HDL 48.10 09/14/2016   Lab Results  Component Value Date   LDLCALC 101 (H) 09/14/2016   Lab Results  Component Value Date   TRIG 177.0 (H) 09/14/2016   Lab Results  Component Value Date   CHOLHDL 4 09/14/2016   Lab Results  Component Value Date   HGBA1C 5.2 09/14/2016       Assessment & Plan:   Problem List Items Addressed This Visit    GERD    Avoid offending foods, start probiotics. Do not eat large meals in late evening and consider raising head of bed.  Has noted some loose stool recently. Encouraged to add a fiber supplements twice daily and report worsening symptoms.       Relevant Medications   esomeprazole (NEXIUM) 40 MG capsule   ranitidine (ZANTAC) 300 MG tablet   Chronic kidney disease    Follows with [facility], no new concerns.       Hypothyroidism    On Levothyroxine, continue to monitor      Relevant Medications   levothyroxine (SYNTHROID,  LEVOTHROID) 125 MCG tablet   CHF (congestive heart failure) (HCC)    No recent flare. No change in meds.      Relevant Medications   Choline Fenofibrate (FENOFIBRIC ACID) 135 MG CPDR   valsartan (DIOVAN) 80 MG tablet   ezetimibe (ZETIA) 10 MG tablet   Hyperlipidemia, mixed    Encouraged heart healthy diet, increase exercise, avoid trans fats, consider a krill oil cap daily      Relevant Medications   Choline Fenofibrate (FENOFIBRIC ACID) 135 MG CPDR   valsartan (DIOVAN) 80 MG tablet   ezetimibe (ZETIA) 10 MG tablet   Anemia    Mild, repeat labs today      Relevant Orders   CBC (Completed)   Essential hypertension    Well controlled, no changes to meds. Encouraged heart healthy diet such as the DASH diet and exercise as tolerated.       Relevant Medications   Choline Fenofibrate (FENOFIBRIC ACID) 135 MG CPDR   valsartan (DIOVAN) 80 MG tablet   ezetimibe (ZETIA) 10 MG tablet   Other Relevant Orders   Comprehensive metabolic panel (Completed)   TSH (Completed)   Hyperglycemia    hgba1c acceptable, minimize simple carbs. Increase exercise as tolerated.       Relevant Orders   Hemoglobin A1c (Completed)    Other Visit Diagnoses    Long-term (current) use of anticoagulants    -  Primary   Relevant Orders   POCT INR (Completed)   Hyperlipidemia, unspecified hyperlipidemia type       Relevant Medications   Choline Fenofibrate (FENOFIBRIC ACID) 135 MG CPDR   valsartan (DIOVAN) 80 MG tablet   ezetimibe (ZETIA) 10 MG tablet   Other Relevant Orders   Lipid panel (Completed)   Encounter for Medicare annual wellness exam          I have changed Emily Mitchell ZETIA to ezetimibe. I have also changed her citalopram, Fenofibric Acid, and valsartan. I am also having her maintain her calcium citrate-vitamin D, Cholecalciferol, albuterol, nitroGLYCERIN, levalbuterol, Cyanocobalamin (VITAMIN B-12 PO), acetaminophen, traMADol, clonazePAM, furosemide, montelukast, warfarin,  esomeprazole, levothyroxine, and ranitidine.  Meds ordered this encounter  Medications  . citalopram (CELEXA) 20 MG tablet    Sig: Take 2 tablets (40 mg total) by mouth daily.    Dispense:  180 tablet    Refill:  1  . esomeprazole (NEXIUM) 40 MG capsule    Sig: Take 1 capsule (40 mg total) by mouth daily at 12 noon.    Dispense:  90 capsule    Refill:  1  . Choline Fenofibrate (FENOFIBRIC ACID) 135 MG CPDR    Sig: Take 135 mg by mouth daily.    Dispense:  90 capsule    Refill:  1  . levothyroxine (SYNTHROID, LEVOTHROID) 125 MCG tablet    Sig: Take 1 tablet (125 mcg total) by mouth daily before breakfast.    Dispense:  90 tablet    Refill:  1  . ranitidine (ZANTAC) 300 MG tablet  Sig: TAKE 1 TABLET EVERY DAY WITH BREAKFAST    Dispense:  90 tablet    Refill:  1  . valsartan (DIOVAN) 80 MG tablet    Sig: Take 0.5 tablets (40 mg total) by mouth daily.    Dispense:  90 tablet    Refill:  1  . ezetimibe (ZETIA) 10 MG tablet    Sig: Take 1 tablet (10 mg total) by mouth daily.    Dispense:  90 tablet    Refill:  1    CMA served as Education administrator during this visit. History, Physical and Plan performed by medical provider. Documentation and orders reviewed and attested to.  Penni Homans, MD

## 2016-09-14 NOTE — Assessment & Plan Note (Signed)
hgba1c acceptable, minimize simple carbs. Increase exercise as tolerated.  

## 2016-09-14 NOTE — Assessment & Plan Note (Signed)
Encouraged heart healthy diet, increase exercise, avoid trans fats, consider a krill oil cap daily 

## 2016-09-14 NOTE — Progress Notes (Signed)
Pre visit review using our clinic review tool, if applicable. No additional management support is needed unless otherwise documented below in the visit note. 

## 2016-09-14 NOTE — Assessment & Plan Note (Signed)
Follows with [facility], no new concerns.

## 2016-09-14 NOTE — Patient Instructions (Addendum)
Increase Coumadin 5 mg tabs to 1 tab po qd sat, sun, tues, thurs and t/2 tab M,W,F Cholesterol Cholesterol is a white, waxy, fat-like substance that is needed by the human body in small amounts. The liver makes all the cholesterol we need. Cholesterol is carried from the liver by the blood through the blood vessels. Deposits of cholesterol (plaques) may build up on blood vessel (artery) walls. Plaques make the arteries narrower and stiffer. Cholesterol plaques increase the risk for heart attack and stroke. You cannot feel your cholesterol level even if it is very high. The only way to know that it is high is to have a blood test. Once you know your cholesterol levels, you should keep a record of the test results. Work with your health care provider to keep your levels in the desired range. What do the results mean?  Total cholesterol is a rough measure of all the cholesterol in your blood.  LDL (low-density lipoprotein) is the "bad" cholesterol. This is the type that causes plaque to build up on the artery walls. You want this level to be low.  HDL (high-density lipoprotein) is the "good" cholesterol because it cleans the arteries and carries the LDL away. You want this level to be high.  Triglycerides are fat that the body can either burn for energy or store. High levels are closely linked to heart disease. What are the desired levels of cholesterol?  Total cholesterol below 200.  LDL below 100 for people who are at risk, below 70 for people at very high risk.  HDL above 40 is good. A level of 60 or higher is considered to be protective against heart disease.  Triglycerides below 150. How can I lower my cholesterol? Diet  Follow your diet program as told by your health care provider.  Choose fish or white meat chicken and [place], roasted or baked. Limit fatty cuts of red meat, fried foods, and processed meats, such as sausage and lunch meats.  Eat lots of fresh fruits and  vegetables.  Choose whole grains, beans, pasta, potatoes, and cereals.  Choose olive oil, corn oil, or canola oil, and use only small amounts.  Avoid butter, mayonnaise, shortening, or palm kernel oils.  Avoid foods with trans fats.  Drink skim or nonfat milk and eat low-fat or nonfat yogurt and cheeses. Avoid whole milk, cream, ice cream, egg yolks, and full-fat cheeses.  Healthier desserts include angel food cake, ginger snaps, animal crackers, hard candy, popsicles, and low-fat or nonfat frozen yogurt. Avoid pastries, cakes, pies, and cookies. Exercise  Follow your exercise program as told by your health care provider. A regular program:  Helps to decrease LDL and raise HDL.  Helps with weight control.  Do things that increase your activity level, such as gardening, walking, and taking the stairs.  Ask your health care provider about ways that you can be more active in your daily life. Medicine  Take over-the-counter and prescription medicines only as told by your health care provider.  Medicine may be prescribed by your health care provider to help lower cholesterol and decrease the risk for heart disease. This is usually done if diet and exercise have failed to bring down cholesterol levels.  If you have several risk factors, you may need medicine even if your levels are normal. This information is not intended to replace advice given to you by your health care provider. Make sure you discuss any questions you have with your health care provider. Document Released: 04/11/2001  Document Revised: 02/12/2016 Document Reviewed: 01/15/2016 Elsevier Interactive Patient Education  2017 [employer].

## 2016-09-14 NOTE — Assessment & Plan Note (Signed)
Mild, repeat labs today

## 2016-09-14 NOTE — Assessment & Plan Note (Signed)
On Levothyroxine, continue to monitor 

## 2016-09-15 LAB — COMPREHENSIVE METABOLIC PANEL
ALK PHOS: 39 U/L (ref 39–117)
ALT: 13 U/L (ref 0–35)
AST: 20 U/L (ref 0–37)
Albumin: 4.3 g/dL (ref 3.5–5.2)
BILIRUBIN TOTAL: 0.4 mg/dL (ref 0.2–1.2)
BUN: 38 mg/dL — ABNORMAL HIGH (ref 6–23)
CALCIUM: 9.9 mg/dL (ref 8.4–10.5)
CO2: 25 meq/L (ref 19–32)
Chloride: 104 mEq/L (ref 96–112)
Creatinine, Ser: 2.02 mg/dL — ABNORMAL HIGH (ref 0.40–1.20)
GFR: 25.99 mL/min — AB (ref >60.00)
Glucose, Bld: 76 mg/dL (ref 70–99)
POTASSIUM: 4.7 meq/L (ref 3.5–5.1)
Sodium: 137 mEq/L (ref 135–145)
Total Protein: 7.1 g/dL (ref 6.0–8.3)

## 2016-09-15 LAB — LIPID PANEL
CHOLESTEROL: 185 mg/dL (ref 0–200)
HDL: 48.1 mg/dL (ref >39.00)
LDL CALC: 101 mg/dL — AB (ref 0–99)
NonHDL: 136.68
TRIGLYCERIDES: 177 mg/dL — AB (ref 0.0–149.0)
Total CHOL/HDL Ratio: 4
VLDL: 35.4 mg/dL (ref 0.0–40.0)

## 2016-09-15 LAB — CBC
HEMATOCRIT: 37.9 % (ref 36.0–46.0)
HEMOGLOBIN: 12.5 g/dL (ref 12.0–15.0)
MCHC: 33 g/dL (ref 30.0–36.0)
MCV: 90.1 fl (ref 78.0–100.0)
PLATELETS: 235 10*3/uL (ref 150.0–400.0)
RBC: 4.21 Mil/uL (ref 3.87–5.11)
RDW: 13.2 % (ref 11.5–15.5)
WBC: 7.9 10*3/uL (ref 4.0–10.5)

## 2016-09-15 LAB — TSH: TSH: 0.15 u[IU]/mL — ABNORMAL LOW (ref 0.35–4.50)

## 2016-09-15 LAB — HEMOGLOBIN A1C: HEMOGLOBIN A1C: 5.2 % (ref 4.6–6.5)

## 2016-09-18 ENCOUNTER — Other Ambulatory Visit: Payer: Self-pay | Admitting: Family Medicine

## 2016-09-18 DIAGNOSIS — R7989 Other specified abnormal findings of blood chemistry: Secondary | ICD-10-CM

## 2016-09-18 DIAGNOSIS — IMO0002 Reserved for concepts with insufficient information to code with codable children: Secondary | ICD-10-CM

## 2016-09-18 DIAGNOSIS — E119 Type 2 diabetes mellitus without complications: Secondary | ICD-10-CM

## 2016-09-18 DIAGNOSIS — E785 Hyperlipidemia, unspecified: Secondary | ICD-10-CM

## 2016-09-18 NOTE — Assessment & Plan Note (Signed)
Avoid offending foods, start probiotics. Do not eat large meals in late evening and consider raising head of bed. Has noted some loose stool recently. Encouraged to add a fiber supplements twice daily and report worsening symptoms.

## 2016-09-18 NOTE — Assessment & Plan Note (Signed)
No recent flare. No change in meds.

## 2016-09-28 ENCOUNTER — Ambulatory Visit (INDEPENDENT_AMBULATORY_CARE_PROVIDER_SITE_OTHER): Payer: Medicare HMO | Admitting: Behavioral Health

## 2016-09-28 DIAGNOSIS — Z86711 Personal history of pulmonary embolism: Secondary | ICD-10-CM

## 2016-09-28 LAB — POCT INR: INR: 2.3

## 2016-09-28 NOTE — Patient Instructions (Signed)
Per Dr. Nani Ravens: Continue taking Coumadin 2.5 mg on Monday, Wednesday & Friday. All other days take Coumadin 5 mg. Return in 4 weeks.

## 2016-09-28 NOTE — Progress Notes (Signed)
Pre visit review using our clinic review tool, if applicable. No additional management support is needed unless otherwise documented below in the visit note.  Patient presents in office for INR check. She voiced compliance with medication & current regimen. Patient reported no changes or positive findings. Today's INR reading was 2.3.  Per Dr. Nani Ravens: Continue taking Coumadin 2.5 mg on Monday, Wednesday & Friday. All other days take Coumadin 5 mg. Return in 4 weeks.  Informed patient of the provider's recommendations. She verbalized understanding and did not have any further concerns before leaving the nurse visit.  Next appointment 10/26/16 at 2:00 PM.

## 2016-09-28 NOTE — Progress Notes (Signed)
Noted. Agree with above.  

## 2016-10-16 DIAGNOSIS — N189 Chronic kidney disease, unspecified: Secondary | ICD-10-CM | POA: Diagnosis not present

## 2016-10-16 DIAGNOSIS — N2581 Secondary hyperparathyroidism of renal origin: Secondary | ICD-10-CM | POA: Diagnosis not present

## 2016-10-16 DIAGNOSIS — N183 Chronic kidney disease, stage 3 (moderate): Secondary | ICD-10-CM | POA: Diagnosis not present

## 2016-10-25 ENCOUNTER — Other Ambulatory Visit: Payer: Self-pay | Admitting: Family Medicine

## 2016-10-25 DIAGNOSIS — I509 Heart failure, unspecified: Secondary | ICD-10-CM | POA: Diagnosis not present

## 2016-10-25 DIAGNOSIS — N183 Chronic kidney disease, stage 3 (moderate): Secondary | ICD-10-CM | POA: Diagnosis not present

## 2016-10-25 DIAGNOSIS — N2581 Secondary hyperparathyroidism of renal origin: Secondary | ICD-10-CM | POA: Diagnosis not present

## 2016-10-25 DIAGNOSIS — N179 Acute kidney failure, unspecified: Secondary | ICD-10-CM | POA: Diagnosis not present

## 2016-10-25 DIAGNOSIS — E785 Hyperlipidemia, unspecified: Secondary | ICD-10-CM | POA: Diagnosis not present

## 2016-10-25 DIAGNOSIS — I10 Essential (primary) hypertension: Secondary | ICD-10-CM | POA: Diagnosis not present

## 2016-10-25 DIAGNOSIS — I2699 Other pulmonary embolism without acute cor pulmonale: Secondary | ICD-10-CM | POA: Diagnosis not present

## 2016-10-25 DIAGNOSIS — E669 Obesity, unspecified: Secondary | ICD-10-CM | POA: Diagnosis not present

## 2016-10-25 DIAGNOSIS — D631 Anemia in chronic kidney disease: Secondary | ICD-10-CM | POA: Diagnosis not present

## 2016-10-26 ENCOUNTER — Encounter: Payer: Self-pay | Admitting: Family Medicine

## 2016-10-26 ENCOUNTER — Ambulatory Visit (INDEPENDENT_AMBULATORY_CARE_PROVIDER_SITE_OTHER): Payer: Medicare HMO | Admitting: Behavioral Health

## 2016-10-26 DIAGNOSIS — Z86711 Personal history of pulmonary embolism: Secondary | ICD-10-CM | POA: Diagnosis not present

## 2016-10-26 LAB — POCT INR: INR: 2.1

## 2016-10-26 NOTE — Telephone Encounter (Signed)
She can have refill but is coming in today so will wait to print til she gets here. Needs UDS and contract

## 2016-10-26 NOTE — Telephone Encounter (Signed)
Requesting:   clonazepam Contract    11/16/2014 UDS    none Last OV   09/14/2016-----next appt is on 01/16/2017 Last Refill    #[phone] with 2 refills on 03/14/2016  Please Advise

## 2016-10-26 NOTE — Progress Notes (Signed)
Pre visit review using our clinic review tool, if applicable. No additional management support is needed unless otherwise documented below in the visit note.  Patient came in clinic today for INR check. She verbalized no changes. All findings were negative. Patient reports medication adherence. INR reading during visit was 2.1.  Advised patient to continue taking Coumadin 2.5 mg on Monday, Wednesday & Friday. All other days take Coumadin 5 mg. Return in 4 weeks.  She understood and did not have any concerns before leaving the nurse visit.  Next appointment 11/23/16 at 2:00 PM.

## 2016-10-26 NOTE — Telephone Encounter (Signed)
Patient being seen by RN today for PT/INR. Spoke to PCP ok to fill/inform of contract/UDS

## 2016-10-26 NOTE — Patient Instructions (Signed)
Continue taking Coumadin 2.5 mg on Monday, Wednesday & Friday. All other days take Coumadin 5 mg. Return in 4 weeks.

## 2016-10-30 DIAGNOSIS — Z79899 Other long term (current) drug therapy: Secondary | ICD-10-CM | POA: Diagnosis not present

## 2016-11-03 DIAGNOSIS — H353211 Exudative age-related macular degeneration, right eye, with active choroidal neovascularization: Secondary | ICD-10-CM | POA: Diagnosis not present

## 2016-11-03 DIAGNOSIS — H43811 Vitreous degeneration, right eye: Secondary | ICD-10-CM | POA: Diagnosis not present

## 2016-11-03 DIAGNOSIS — H353121 Nonexudative age-related macular degeneration, left eye, early dry stage: Secondary | ICD-10-CM | POA: Diagnosis not present

## 2016-11-03 DIAGNOSIS — H04123 Dry eye syndrome of bilateral lacrimal glands: Secondary | ICD-10-CM | POA: Diagnosis not present

## 2016-11-03 DIAGNOSIS — Z961 Presence of intraocular lens: Secondary | ICD-10-CM | POA: Diagnosis not present

## 2016-11-09 ENCOUNTER — Encounter: Payer: Self-pay | Admitting: Family Medicine

## 2016-11-23 ENCOUNTER — Ambulatory Visit (INDEPENDENT_AMBULATORY_CARE_PROVIDER_SITE_OTHER): Payer: Medicare HMO

## 2016-11-23 DIAGNOSIS — Z86711 Personal history of pulmonary embolism: Secondary | ICD-10-CM

## 2016-11-23 LAB — POCT INR: INR: 3

## 2016-11-23 NOTE — Progress Notes (Signed)
9

## 2016-11-23 NOTE — Patient Instructions (Signed)
Per Dr. Charlett Blake continue taking Coumadin 2.5 mg on Monday, Wednesday & Friday. All other days take Coumadin 5 mg. Return in 4 weeks. Appointment scheduled for 12/21/16 @ 2:30

## 2016-11-23 NOTE — Progress Notes (Signed)
Pre visit review using our clinic tool,if applicable. No additional management support is needed unless otherwise documented below in the visit note.   Patient in for INR re-check per order from Dr. Charlett Blake dated 10/26/16.INR this day was 2.1  Patient taking Coumadin 5mg  on Sunday,Tuesday,Thursday and Saturday. 2.69m on Monday,Wednesday and Friday.   INR today = 3.0  Per Dr. Charlett Blake continue taking Coumadin 2.5 mg on Monday, Wednesday & Friday. All other days take Coumadin 5 mg. Return in 4 weeks. Appointment Scheduled for May 2 @ 2:30 pm. Patient notified.

## 2016-12-05 DIAGNOSIS — H353121 Nonexudative age-related macular degeneration, left eye, early dry stage: Secondary | ICD-10-CM | POA: Diagnosis not present

## 2016-12-05 DIAGNOSIS — H353211 Exudative age-related macular degeneration, right eye, with active choroidal neovascularization: Secondary | ICD-10-CM | POA: Diagnosis not present

## 2016-12-21 ENCOUNTER — Ambulatory Visit (INDEPENDENT_AMBULATORY_CARE_PROVIDER_SITE_OTHER): Payer: Medicare HMO | Admitting: Behavioral Health

## 2016-12-21 ENCOUNTER — Other Ambulatory Visit (INDEPENDENT_AMBULATORY_CARE_PROVIDER_SITE_OTHER): Payer: Medicare HMO

## 2016-12-21 DIAGNOSIS — E119 Type 2 diabetes mellitus without complications: Secondary | ICD-10-CM

## 2016-12-21 DIAGNOSIS — E785 Hyperlipidemia, unspecified: Secondary | ICD-10-CM

## 2016-12-21 DIAGNOSIS — R7989 Other specified abnormal findings of blood chemistry: Secondary | ICD-10-CM | POA: Diagnosis not present

## 2016-12-21 DIAGNOSIS — Z86711 Personal history of pulmonary embolism: Secondary | ICD-10-CM

## 2016-12-21 LAB — LIPID PANEL
Cholesterol: 192 mg/dL (ref 0–200)
HDL: 45.4 mg/dL (ref >39.00)
NonHDL: 146.76
Total CHOL/HDL Ratio: 4
Triglycerides: 258 mg/dL — ABNORMAL HIGH (ref 0.0–149.0)
VLDL: 51.6 mg/dL — AB (ref 0.0–40.0)

## 2016-12-21 LAB — CBC
HCT: 38.5 % (ref 36.0–46.0)
HEMOGLOBIN: 12.7 g/dL (ref 12.0–15.0)
MCHC: 33 g/dL (ref 30.0–36.0)
MCV: 90.4 fl (ref 78.0–100.0)
PLATELETS: 243 10*3/uL (ref 150.0–400.0)
RBC: 4.26 Mil/uL (ref 3.87–5.11)
RDW: 13.5 % (ref 11.5–15.5)
WBC: 6.5 10*3/uL (ref 4.0–10.5)

## 2016-12-21 LAB — TSH: TSH: 0.55 u[IU]/mL (ref 0.35–4.50)

## 2016-12-21 LAB — POCT INR: INR: 3

## 2016-12-21 LAB — LDL CHOLESTEROL, DIRECT: Direct LDL: 109 mg/dL

## 2016-12-21 LAB — HEMOGLOBIN A1C: Hgb A1c MFr Bld: 5.3 % (ref 4.6–6.5)

## 2016-12-21 MED ORDER — MONTELUKAST SODIUM 10 MG PO TABS: ORAL_TABLET | ORAL | 1 refills | Status: DC

## 2016-12-21 MED ORDER — WARFARIN SODIUM 5 MG PO TABS: ORAL_TABLET | ORAL | 1 refills | Status: DC

## 2016-12-21 NOTE — Patient Instructions (Signed)
Continue taking Coumadin 2.5 mg on Monday, Wednesday & Friday. All other days take Coumadin 5 mg. Return in 4 weeks.

## 2016-12-21 NOTE — Progress Notes (Signed)
Pre visit review using our clinic review tool, if applicable. No additional management support is needed unless otherwise documented below in the visit note.  Patient came in office for INR check. She reported no changes or positive findings. Patient is compliant with medication & regimen. Today's INR reading was 3.0.  Advised patient to continue taking Coumadin 2.5 mg on Monday, Wednesday & Friday. All other days take Coumadin 5 mg. Return in 4 weeks.  Patient voiced understanding. Next appointment 01/16/17 at 1:30 PM.

## 2016-12-26 DIAGNOSIS — N189 Chronic kidney disease, unspecified: Secondary | ICD-10-CM | POA: Diagnosis not present

## 2016-12-26 DIAGNOSIS — N183 Chronic kidney disease, stage 3 (moderate): Secondary | ICD-10-CM | POA: Diagnosis not present

## 2017-01-04 DIAGNOSIS — H353211 Exudative age-related macular degeneration, right eye, with active choroidal neovascularization: Secondary | ICD-10-CM | POA: Diagnosis not present

## 2017-01-04 DIAGNOSIS — H353121 Nonexudative age-related macular degeneration, left eye, early dry stage: Secondary | ICD-10-CM | POA: Diagnosis not present

## 2017-01-16 ENCOUNTER — Encounter: Payer: Self-pay | Admitting: Family Medicine

## 2017-01-16 ENCOUNTER — Ambulatory Visit (INDEPENDENT_AMBULATORY_CARE_PROVIDER_SITE_OTHER): Payer: Medicare HMO | Admitting: Family Medicine

## 2017-01-16 VITALS — BP 128/68 | HR 70 | Temp 98.1°F | Resp 18 | Ht 63.0 in | Wt 183.8 lb

## 2017-01-16 DIAGNOSIS — H3530 Unspecified macular degeneration: Secondary | ICD-10-CM

## 2017-01-16 DIAGNOSIS — I2609 Other pulmonary embolism with acute cor pulmonale: Secondary | ICD-10-CM

## 2017-01-16 DIAGNOSIS — E039 Hypothyroidism, unspecified: Secondary | ICD-10-CM | POA: Diagnosis not present

## 2017-01-16 DIAGNOSIS — N189 Chronic kidney disease, unspecified: Secondary | ICD-10-CM | POA: Diagnosis not present

## 2017-01-16 DIAGNOSIS — E782 Mixed hyperlipidemia: Secondary | ICD-10-CM | POA: Diagnosis not present

## 2017-01-16 DIAGNOSIS — R739 Hyperglycemia, unspecified: Secondary | ICD-10-CM

## 2017-01-16 DIAGNOSIS — K5900 Constipation, unspecified: Secondary | ICD-10-CM

## 2017-01-16 DIAGNOSIS — R768 Other specified abnormal immunological findings in serum: Secondary | ICD-10-CM

## 2017-01-16 DIAGNOSIS — I10 Essential (primary) hypertension: Secondary | ICD-10-CM | POA: Diagnosis not present

## 2017-01-16 DIAGNOSIS — R1013 Epigastric pain: Secondary | ICD-10-CM

## 2017-01-16 DIAGNOSIS — G8929 Other chronic pain: Secondary | ICD-10-CM

## 2017-01-16 DIAGNOSIS — J449 Chronic obstructive pulmonary disease, unspecified: Secondary | ICD-10-CM | POA: Diagnosis not present

## 2017-01-16 DIAGNOSIS — M25562 Pain in left knee: Secondary | ICD-10-CM

## 2017-01-16 DIAGNOSIS — Z0000 Encounter for general adult medical examination without abnormal findings: Secondary | ICD-10-CM | POA: Diagnosis not present

## 2017-01-16 DIAGNOSIS — M25561 Pain in right knee: Secondary | ICD-10-CM

## 2017-01-16 LAB — POCT INR: INR: 1.9

## 2017-01-16 MED ORDER — MONTELUKAST SODIUM 10 MG PO TABS: 10.0000 mg | ORAL_TABLET | Freq: Every evening | ORAL | 1 refills | Status: DC | PRN

## 2017-01-16 MED ORDER — CETIRIZINE HCL 10 MG PO TABS: 10.0000 mg | ORAL_TABLET | Freq: Every day | ORAL | 11 refills | Status: DC | PRN

## 2017-01-16 MED ORDER — TRAMADOL HCL 50 MG PO TABS: 50.0000 mg | ORAL_TABLET | Freq: Three times a day (TID) | ORAL | 0 refills | Status: DC | PRN

## 2017-01-16 NOTE — Assessment & Plan Note (Signed)
She is s/p b/l TKR but she is noticing worsening pain, stiffness, instability in both knees. Will refer back to orthopaedics when patient is ready

## 2017-01-16 NOTE — Assessment & Plan Note (Signed)
Encouraged increased hydration and fiber in diet. Daily probiotics. If bowels not moving can use MOM 2 tbls po in 4 oz of warm prune juice by mouth every 2-3 days. If no results then repeat in 4 hours with  Dulcolax suppository pr, may repeat again in 4 more hours as needed. Seek care if symptoms worsen. Consider daily Miralax and/or Dulcolax if symptoms persist.  

## 2017-01-16 NOTE — Assessment & Plan Note (Signed)
Creatinine was elevated with last draw will check daily

## 2017-01-16 NOTE — Assessment & Plan Note (Signed)
On Levothyroxine, continue to monitor 

## 2017-01-16 NOTE — Assessment & Plan Note (Addendum)
H/o H Pylori, check breath test today. If no cause then will proceed with imaging. Minimize fatty foods, spicy foods, do not eat close to bedtime and report worsening symptom. Testing positive for H Pylori will treat with regimen without Amoxicillin due to PCN allergy

## 2017-01-16 NOTE — Assessment & Plan Note (Signed)
Patient encouraged to maintain heart healthy diet, regular exercise, adequate sleep. Consider daily probiotics. Take medications as prescribed 

## 2017-01-16 NOTE — Assessment & Plan Note (Signed)
Sees [facility], Cheryll Cockayne, MD and is needing shots in right eye monthly for past 3 months now stretching out to 6 weeks.

## 2017-01-16 NOTE — Patient Instructions (Addendum)
Shingrix is the new new shingles shot, check with your pharmacy to see if your insurance covers this and then receive if able   Preventive Care 69 Years and Older, Female Preventive care refers to lifestyle choices and visits with your health care provider that can promote health and wellness. What does preventive care include?  A yearly physical exam. This is also called an annual well check.  Dental exams once or twice a year.  Routine eye exams. Ask your health care provider how often you should have your eyes checked.  Personal lifestyle choices, including: ? Daily care of your teeth and gums. ? Regular physical activity. ? Eating a healthy diet. ? Avoiding tobacco and drug use. ? Limiting alcohol use. ? Practicing safe sex. ? Taking low-dose aspirin every day. ? Taking vitamin and mineral supplements as recommended by your health care provider. What happens during an annual well check? The services and screenings done by your health care provider during your annual well check will depend on your age, overall health, lifestyle risk factors, and family history of disease. Counseling Your health care provider may ask you questions about your:  Alcohol use.  Tobacco use.  Drug use.  Emotional well-being.  Home and relationship well-being.  Sexual activity.  Eating habits.  History of falls.  Memory and ability to understand (cognition).  Work and work Statistician.  Reproductive health.  Screening You may have the following tests or measurements:  Height, weight, and BMI.  Blood pressure.  Lipid and cholesterol levels. These may be checked every 5 years, or more frequently if you are over 68 years old.  Skin check.  Lung cancer screening. You may have this screening every year starting at age 60 if you have a 30-pack-year history of smoking and currently smoke or have quit within the past 15 years.  Fecal occult blood test (FOBT) of the stool. You may  have this test every year starting at age 61.  Flexible sigmoidoscopy or colonoscopy. You may have a sigmoidoscopy every 5 years or a colonoscopy every 10 years starting at age 56.  Hepatitis C blood test.  Hepatitis B blood test.  Sexually transmitted disease (STD) testing.  Diabetes screening. This is done by checking your blood sugar (glucose) after you have not eaten for a while (fasting). You may have this done every 1-3 years.  Bone density scan. This is done to screen for osteoporosis. You may have this done starting at age 35.  Mammogram. This may be done every 1-2 years. Talk to your health care provider about how often you should have regular mammograms.  Talk with your health care provider about your test results, treatment options, and if necessary, the need for more tests. Vaccines Your health care provider may recommend certain vaccines, such as:  Influenza vaccine. This is recommended every year.  Tetanus, diphtheria, and acellular pertussis (Tdap, Td) vaccine. You may need a Td booster every 10 years.  Varicella vaccine. You may need this if you have not been vaccinated.  Zoster vaccine. You may need this after age 83.  Measles, mumps, and rubella (MMR) vaccine. You may need at least one dose of MMR if you were born in 1957 or later. You may also need a second dose.  Pneumococcal 13-valent conjugate (PCV13) vaccine. One dose is recommended after age 7.  Pneumococcal polysaccharide (PPSV23) vaccine. One dose is recommended after age 54.  Meningococcal vaccine. You may need this if you have certain conditions.  Hepatitis A  vaccine. You may need this if you have certain conditions or if you travel or work in places where you may be exposed to hepatitis A.  Hepatitis B vaccine. You may need this if you have certain conditions or if you travel or work in places where you may be exposed to hepatitis B.  Haemophilus influenzae type b (Hib) vaccine. You may need this  if you have certain conditions.  Talk to your health care provider about which screenings and vaccines you need and how often you need them. This information is not intended to replace advice given to you by your health care provider. Make sure you discuss any questions you have with your health care provider. Document Released: 08/13/2015 Document Revised: 04/05/2016 Document Reviewed: 05/18/2015 Elsevier Interactive Patient Education  2017 [employer].

## 2017-01-16 NOTE — Assessment & Plan Note (Addendum)
Notes her SOB and wheezing are worsening, is going to follow up with pulmonology when she agrees, Dr Elsworth Soho

## 2017-01-16 NOTE — Assessment & Plan Note (Signed)
Encouraged heart healthy diet, increase exercise, avoid trans fats, consider a krill oil cap daily 

## 2017-01-16 NOTE — Assessment & Plan Note (Signed)
Well controlled, no changes to meds. Encouraged heart healthy diet such as the DASH diet and exercise as tolerated.  °

## 2017-01-16 NOTE — Progress Notes (Signed)
Patient ID: Emily Mitchell, female   DOB: 07/05/1946, 71 y.o.   MRN: [id number]   Subjective:    Patient ID: Emily Mitchell, female    DOB: March 19, 1946, 71 y.o.   MRN: [id number]  Chief Complaint  Patient presents with  . Annual Exam    HPI Patient is in today for annual physical exam and follow up on medical conditions including hyperglycemia, reflux, chronic kidney disease, hypothyroidism and more. Her greatest complaint is significant epigastric discomfort. Worse after eating and occurring daily. Notes some swelling in the region as well. Bowels are moving without bloody or tarry stool noted. No recent fevers or chills. Denies CP/palp/HA/congestion/fevers or GU c/o. Taking meds as prescribed  Past Medical History:  Diagnosis Date  . Anemia 07/05/2014  . Anxiety and depression 12/14/2008   Qualifier: Diagnosis of  By: [name] LPN, Almyra Free    . Arthritis   . Asthma   . Atypical chest pain 05/23/2015  . Carotid artery occlusion   . CHF (congestive heart failure) ([place])   . Chicken pox as a child  . Chronic kidney disease    Bright's Disease at age 89   . COPD (chronic obstructive pulmonary disease) ([place])   . GERD (gastroesophageal reflux disease)   . H. pylori infection 10/19/2013  . Hyperglycemia 03/14/2016  . Hyperlipidemia   . Hypertension   . Hypothyroidism   . Knee pain, bilateral 12/17/2012  . Left hip pain 05/23/2015  . Low back pain 09/19/2015  . Macular degeneration of both eyes 12/16/2015   Right eye is wet Left Eye is dry Shots every 11 to 12 weeks in right eye at opthamologist office  . Medicare annual wellness visit, subsequent 02/21/2015  . Mumps 71 yrs old  . Preventative health care 03/14/2016  . Pulmonary emboli ([place]) 05/26/2013  . Tremor   . UTI (lower urinary tract infection) 10/19/2013    Past Surgical History:  Procedure Laterality Date  . ABDOMINAL HYSTERECTOMY  1984  . CAROTID ENDARTERECTOMY Right 05/10/07   cea  . CATARACT EXTRACTION Left   . EYE SURGERY       b/l  . knees replaced  2005 and 2011   both knees  . WISDOM TOOTH EXTRACTION      Family History  Problem Relation Age of Onset  . Stroke Mother        mini stroke  . Kidney disease Mother   . Heart failure Mother   . Hypertension Mother   . Diabetes Sister        type 2  . Kidney disease Sister   . Heart attack Maternal Grandfather   . Hyperlipidemia Sister     Social History   Social History  . Marital status: Divorced    Spouse name: N/A  . Number of children: 2  . Years of education: N/A   Occupational History  . Not on file.   Social History Main Topics  . Smoking status: Former Smoker    Packs/day: 2.00    Years: 30.00    Types: Cigarettes    Quit date: 12/03/1994  . Smokeless tobacco: Never Used  . Alcohol use Yes     Comment: rarely, 1/month  . Drug use: No  . Sexual activity: No     Comment: lives with son and friend, no dietary restrictions   Other Topics Concern  . Not on file   Social History Narrative   epworth sleepiness scale = 11 (06/15/2015)    Outpatient Medications Prior  to Visit  Medication Sig Dispense Refill  . acetaminophen (TYLENOL) 500 MG tablet Take 500 mg by mouth every 6 (six) hours as needed.    Marland Kitchen albuterol (PROVENTIL HFA;VENTOLIN HFA) 108 (90 BASE) MCG/ACT inhaler Inhale 2 puffs into the lungs every 4 (four) hours as needed for wheezing.    . calcium citrate-vitamin D (CITRACAL+D) 315-200 MG-UNIT per tablet Take 1 tablet by mouth daily.     . Cholecalciferol (D3 MAXIMUM STRENGTH) 5000 UNITS capsule Take 5,000 Units by mouth daily.    . Choline Fenofibrate (FENOFIBRIC ACID) 135 MG CPDR Take 135 mg by mouth daily. 90 capsule 1  . citalopram (CELEXA) 20 MG tablet Take 2 tablets (40 mg total) by mouth daily. 180 tablet 1  . clonazePAM (KLONOPIN) 1 MG tablet TAKE ONE TABLET BY MOUTH THREE TIMES DAILY AS NEEDED FOR ANXIETY 90 tablet 2  . Cyanocobalamin (VITAMIN B-12 PO) Take by mouth.    . ezetimibe (ZETIA) 10 MG tablet Take 1  tablet (10 mg total) by mouth daily. 90 tablet 1  . furosemide (LASIX) 20 MG tablet Take 1 tablet (20 mg total) by mouth daily as needed for edema. 30 tablet 3  . levalbuterol (XOPENEX) 1.25 MG/0.5ML nebulizer solution Take 1.25 mg by nebulization every 4 (four) hours as needed for wheezing. 30 each 1  . levothyroxine (SYNTHROID, LEVOTHROID) 125 MCG tablet Take 1 tablet (125 mcg total) by mouth daily before breakfast. 90 tablet 1  . nitroGLYCERIN (NITROSTAT) 0.4 MG SL tablet Place 1 tablet (0.4 mg total) under the tongue every 5 (five) minutes as needed for chest pain. 25 tablet 3  . ranitidine (ZANTAC) 300 MG tablet TAKE 1 TABLET EVERY DAY WITH BREAKFAST 90 tablet 1  . valsartan (DIOVAN) 80 MG tablet Take 0.5 tablets (40 mg total) by mouth daily. 90 tablet 1  . warfarin (COUMADIN) 5 MG tablet TAKE AS DIRECTED 90 tablet 1  . esomeprazole (NEXIUM) 40 MG capsule Take 1 capsule (40 mg total) by mouth daily at 12 noon. 90 capsule 1  . montelukast (SINGULAIR) 10 MG tablet TAKE 1 TABLET (10 MG TOTAL) BY MOUTH AT BEDTIME. DOES NOT TAKE WEDNESDAY AND SUNDAY 65 tablet 1  . traMADol (ULTRAM) 50 MG tablet Take 1 tablet (50 mg total) by mouth every 8 (eight) hours as needed for moderate pain or severe pain. 90 tablet 0   No facility-administered medications prior to visit.     Allergies  Allergen Reactions  . Niaspan [Niacin Er] Nausea And Vomiting and Swelling    Swelling in mouth  . Pantoprazole     Mouth sores  . Bupropion Other (See Comments)    Uncontrollable shakes  . Penicillins Hives  . Statins   . Sulfonamide Derivatives Hives  . Apple Rash  . Banana Rash  . Milk-Related Compounds Rash    Review of Systems  Constitutional: Negative for chills, fever and malaise/fatigue.  HENT: Negative for congestion and hearing loss.   Eyes: Negative for discharge.  Respiratory: Positive for shortness of breath. Negative for cough and sputum production.   Cardiovascular: Negative for chest pain,  palpitations and leg swelling.  Gastrointestinal: Positive for abdominal pain and nausea. Negative for blood in stool, constipation, diarrhea, heartburn and vomiting.  Genitourinary: Negative for dysuria, frequency, hematuria and urgency.  Musculoskeletal: Negative for back pain, falls and myalgias.  Skin: Negative for rash.  Neurological: Negative for dizziness, sensory change, loss of consciousness, weakness and headaches.  Endo/Heme/Allergies: Negative for environmental allergies. Does not bruise/bleed easily.  Psychiatric/Behavioral: Negative for depression and suicidal ideas. The patient is not nervous/anxious and does not have insomnia.        Objective:    Physical Exam  Constitutional: She is oriented to person, place, and time. She appears well-developed and well-nourished. No distress.  HENT:  Head: Normocephalic and atraumatic.  Eyes: Conjunctivae are normal.  Neck: Neck supple. No thyromegaly present.  Cardiovascular: Normal rate, regular rhythm and normal heart sounds.   No murmur heard. Pulmonary/Chest: Effort normal and breath sounds normal. No respiratory distress.  Abdominal: Soft. Bowel sounds are normal. She exhibits no distension and no mass. There is no tenderness. There is no rebound and no guarding.  Musculoskeletal: She exhibits no edema.  Lymphadenopathy:    She has no cervical adenopathy.  Neurological: She is alert and oriented to person, place, and time.  Skin: Skin is warm and dry.  Psychiatric: She has a normal mood and affect. Her behavior is normal.    BP 128/68 (BP Location: Left Arm, Patient Position: Sitting, Cuff Size: Normal)   Pulse 70   Temp 98.1 F (36.7 C) (Oral)   Resp 18   Ht 5\' 3"  (1.6 m)   Wt 183 lb 12.8 oz (83.4 kg)   SpO2 96%   BMI 32.56 kg/m  Wt Readings from Last 3 Encounters:  01/16/17 183 lb 12.8 oz (83.4 kg)  09/14/16 183 lb (83 kg)  03/16/16 186 lb (84.4 kg)     Lab Results  Component Value Date   WBC 6.5  12/21/2016   HGB 12.7 12/21/2016   HCT 38.5 12/21/2016   PLT 243.0 12/21/2016   GLUCOSE 76 09/14/2016   CHOL 192 12/21/2016   TRIG 258.0 (H) 12/21/2016   HDL 45.40 12/21/2016   LDLDIRECT 109.0 12/21/2016   LDLCALC 101 (H) 09/14/2016   ALT 13 09/14/2016   AST 20 09/14/2016   NA 137 09/14/2016   K 4.7 09/14/2016   CL 104 09/14/2016   CREATININE 2.02 (H) 09/14/2016   BUN 38 (H) 09/14/2016   CO2 25 09/14/2016   TSH 0.55 12/21/2016   INR 1.9 01/16/2017   HGBA1C 5.3 12/21/2016    Lab Results  Component Value Date   TSH 0.55 12/21/2016   Lab Results  Component Value Date   WBC 6.5 12/21/2016   HGB 12.7 12/21/2016   HCT 38.5 12/21/2016   MCV 90.4 12/21/2016   PLT 243.0 12/21/2016   Lab Results  Component Value Date   NA 137 09/14/2016   K 4.7 09/14/2016   CO2 25 09/14/2016   GLUCOSE 76 09/14/2016   BUN 38 (H) 09/14/2016   CREATININE 2.02 (H) 09/14/2016   BILITOT 0.4 09/14/2016   ALKPHOS 39 09/14/2016   AST 20 09/14/2016   ALT 13 09/14/2016   PROT 7.1 09/14/2016   ALBUMIN 4.3 09/14/2016   CALCIUM 9.9 09/14/2016   ANIONGAP 13 03/14/2014   GFR 25.99 (L) 09/14/2016   Lab Results  Component Value Date   CHOL 192 12/21/2016   Lab Results  Component Value Date   HDL 45.40 12/21/2016   Lab Results  Component Value Date   LDLCALC 101 (H) 09/14/2016   Lab Results  Component Value Date   TRIG 258.0 (H) 12/21/2016   Lab Results  Component Value Date   CHOLHDL 4 12/21/2016   Lab Results  Component Value Date   HGBA1C 5.3 12/21/2016       Assessment & Plan:   Problem List Items Addressed This Visit  COPD (chronic obstructive pulmonary disease) (HCC)    Notes her SOB and wheezing are worsening, is going to follow up with pulmonology when she agrees, Dr Elsworth Soho      Relevant Medications   montelukast (SINGULAIR) 10 MG tablet   cetirizine (ZYRTEC) 10 MG tablet   Constipation    Encouraged increased hydration and fiber in diet. Daily probiotics. If  bowels not moving can use MOM 2 tbls po in 4 oz of warm prune juice by mouth every 2-3 days. If no results then repeat in 4 hours with  Dulcolax suppository pr, may repeat again in 4 more hours as needed. Seek care if symptoms worsen. Consider daily Miralax and/or Dulcolax if symptoms persist.       Knee pain, bilateral    She is s/p b/l TKR but she is noticing worsening pain, stiffness, instability in both knees. Will refer back to orthopaedics when patient is ready       Chronic kidney disease    Creatinine was elevated with last draw will check daily      Hypothyroidism    On Levothyroxine, continue to monitor      Hyperlipidemia, mixed    Encouraged heart healthy diet, increase exercise, avoid trans fats, consider a krill oil cap daily      Pulmonary emboli (HCC)   Relevant Orders   POCT INR (Completed)   Essential hypertension - Primary    Well controlled, no changes to meds. Encouraged heart healthy diet such as the DASH diet and exercise as tolerated.       Macular degeneration of both eyes    Sees [facility], Cheryll Cockayne, MD and is needing shots in right eye monthly for past 3 months now stretching out to 6 weeks.       Hyperglycemia    hgba1c acceptable, minimize simple carbs. Increase exercise as tolerated.      Preventative health care    Patient encouraged to maintain heart healthy diet, regular exercise, adequate sleep. Consider daily probiotics. Take medications as prescribed      Acute epigastric pain    H/o H Pylori, check breath test today. If no cause then will proceed with imaging. Minimize fatty foods, spicy foods, do not eat close to bedtime and report worsening symptom. Testing positive for H Pylori will treat with regimen without Amoxicillin due to PCN allergy      Relevant Orders   H. pylori breath test (Completed)    Other Visit Diagnoses    Helicobacter pylori ab+       Relevant Orders   H. pylori breath test (Completed)        I have discontinued Ms. Paiva esomeprazole. I have also changed her montelukast. Additionally, I am having her start on cetirizine. Lastly, I am having her maintain her calcium citrate-vitamin D, Cholecalciferol, albuterol, nitroGLYCERIN, levalbuterol, Cyanocobalamin (VITAMIN B-12 PO), acetaminophen, furosemide, citalopram, Fenofibric Acid, levothyroxine, ranitidine, valsartan, ezetimibe, clonazePAM, warfarin, FISH OIL-KRILL OIL PO, and traMADol.  Meds ordered this encounter  Medications  . FISH OIL-KRILL OIL PO    Sig: Take by mouth.  . traMADol (ULTRAM) 50 MG tablet    Sig: Take 1 tablet (50 mg total) by mouth every 8 (eight) hours as needed for moderate pain or severe pain.    Dispense:  90 tablet    Refill:  0  . montelukast (SINGULAIR) 10 MG tablet    Sig: Take 1 tablet (10 mg total) by mouth at bedtime as needed.    Dispense:  65 tablet    Refill:  1  . cetirizine (ZYRTEC) 10 MG tablet    Sig: Take 1 tablet (10 mg total) by mouth daily as needed for allergies.    Dispense:  30 tablet    Refill:  11    CMA served as scribe during this visit. History, Physical and Plan performed by medical provider. Documentation and orders reviewed and attested to.  Penni Homans, MD

## 2017-01-16 NOTE — Assessment & Plan Note (Addendum)
hgba1c acceptable, minimize simple carbs. Increase exercise as tolerated.  

## 2017-01-17 LAB — H. PYLORI BREATH TEST: H. pylori Breath Test: DETECTED — AB

## 2017-01-18 ENCOUNTER — Other Ambulatory Visit: Payer: Self-pay | Admitting: Family Medicine

## 2017-01-18 MED ORDER — OMEPRAZOLE 20 MG PO CPDR: 20.0000 mg | DELAYED_RELEASE_CAPSULE | Freq: Two times a day (BID) | ORAL | 0 refills | Status: DC

## 2017-01-18 MED ORDER — METRONIDAZOLE 500 MG PO TABS: 500.0000 mg | ORAL_TABLET | Freq: Four times a day (QID) | ORAL | 0 refills | Status: DC

## 2017-01-18 MED ORDER — TETRACYCLINE HCL 500 MG PO CAPS: 500.0000 mg | ORAL_CAPSULE | Freq: Four times a day (QID) | ORAL | 0 refills | Status: DC

## 2017-01-18 MED ORDER — BISMUTH SUBSALICYLATE 262 MG PO CHEW: CHEWABLE_TABLET | ORAL | 0 refills | Status: DC

## 2017-02-03 ENCOUNTER — Emergency Department (HOSPITAL_BASED_OUTPATIENT_CLINIC_OR_DEPARTMENT_OTHER): Payer: Medicare HMO

## 2017-02-03 ENCOUNTER — Encounter (HOSPITAL_BASED_OUTPATIENT_CLINIC_OR_DEPARTMENT_OTHER): Payer: Self-pay | Admitting: Emergency Medicine

## 2017-02-03 ENCOUNTER — Emergency Department (HOSPITAL_BASED_OUTPATIENT_CLINIC_OR_DEPARTMENT_OTHER)
Admission: EM | Admit: 2017-02-03 | Discharge: 2017-02-04 | Disposition: A | Discharge status: Home / Self Care | Payer: Medicare HMO | Attending: Emergency Medicine | Admitting: Emergency Medicine

## 2017-02-03 DIAGNOSIS — Z87891 Personal history of nicotine dependence: Secondary | ICD-10-CM | POA: Insufficient documentation

## 2017-02-03 DIAGNOSIS — E039 Hypothyroidism, unspecified: Secondary | ICD-10-CM | POA: Diagnosis not present

## 2017-02-03 DIAGNOSIS — R0600 Dyspnea, unspecified: Secondary | ICD-10-CM | POA: Diagnosis not present

## 2017-02-03 DIAGNOSIS — N189 Chronic kidney disease, unspecified: Secondary | ICD-10-CM | POA: Insufficient documentation

## 2017-02-03 DIAGNOSIS — Z79899 Other long term (current) drug therapy: Secondary | ICD-10-CM | POA: Insufficient documentation

## 2017-02-03 DIAGNOSIS — J45909 Unspecified asthma, uncomplicated: Secondary | ICD-10-CM | POA: Diagnosis not present

## 2017-02-03 DIAGNOSIS — I509 Heart failure, unspecified: Secondary | ICD-10-CM | POA: Insufficient documentation

## 2017-02-03 DIAGNOSIS — J449 Chronic obstructive pulmonary disease, unspecified: Secondary | ICD-10-CM | POA: Insufficient documentation

## 2017-02-03 DIAGNOSIS — R791 Abnormal coagulation profile: Secondary | ICD-10-CM | POA: Diagnosis not present

## 2017-02-03 DIAGNOSIS — Z96653 Presence of artificial knee joint, bilateral: Secondary | ICD-10-CM | POA: Diagnosis not present

## 2017-02-03 DIAGNOSIS — I130 Hypertensive heart and chronic kidney disease with heart failure and stage 1 through stage 4 chronic kidney disease, or unspecified chronic kidney disease: Secondary | ICD-10-CM | POA: Insufficient documentation

## 2017-02-03 DIAGNOSIS — R079 Chest pain, unspecified: Secondary | ICD-10-CM | POA: Diagnosis not present

## 2017-02-03 DIAGNOSIS — R0602 Shortness of breath: Secondary | ICD-10-CM | POA: Diagnosis not present

## 2017-02-03 LAB — BASIC METABOLIC PANEL
Anion gap: 9 (ref 5–15)
BUN: 39 mg/dL — ABNORMAL HIGH (ref 6–20)
CHLORIDE: 103 mmol/L (ref 101–111)
CO2: 26 mmol/L (ref 22–32)
CREATININE: 1.92 mg/dL — AB (ref 0.44–1.00)
Calcium: 9.3 mg/dL (ref 8.9–10.3)
GFR, EST AFRICAN AMERICAN: 30 mL/min — AB (ref >60)
GFR, EST NON AFRICAN AMERICAN: 26 mL/min — AB (ref >60)
Glucose, Bld: 106 mg/dL — ABNORMAL HIGH (ref 65–99)
POTASSIUM: 4.1 mmol/L (ref 3.5–5.1)
SODIUM: 138 mmol/L (ref 135–145)

## 2017-02-03 LAB — CBC WITH DIFFERENTIAL/PLATELET
BASOS PCT: 1 %
Basophils Absolute: 0.1 10*3/uL (ref 0.0–0.1)
EOS PCT: 2 %
Eosinophils Absolute: 0.1 10*3/uL (ref 0.0–0.7)
HCT: 38.1 % (ref 36.0–46.0)
Hemoglobin: 12.9 g/dL (ref 12.0–15.0)
LYMPHS PCT: 35 %
Lymphs Abs: 2.6 10*3/uL (ref 0.7–4.0)
MCH: 30.8 pg (ref 26.0–34.0)
MCHC: 33.9 g/dL (ref 30.0–36.0)
MCV: 90.9 fL (ref 78.0–100.0)
MONO ABS: 0.7 10*3/uL (ref 0.1–1.0)
Monocytes Relative: 10 %
Neutro Abs: 3.9 10*3/uL (ref 1.7–7.7)
Neutrophils Relative %: 52 %
PLATELETS: 164 10*3/uL (ref 150–400)
RBC: 4.19 MIL/uL (ref 3.87–5.11)
RDW: 14.2 % (ref 11.5–15.5)
WBC: 7.4 10*3/uL (ref 4.0–10.5)

## 2017-02-03 LAB — BRAIN NATRIURETIC PEPTIDE: B NATRIURETIC PEPTIDE 5: 24.8 pg/mL (ref 0.0–100.0)

## 2017-02-03 LAB — PROTIME-INR
INR: 5.84 — AB
PROTHROMBIN TIME: 54.2 s — AB (ref 11.4–15.2)

## 2017-02-03 LAB — TROPONIN I: Troponin I: <0.03 ng/mL (ref <0.03)

## 2017-02-03 MED ORDER — LEVALBUTEROL HCL 1.25 MG/0.5ML IN NEBU: 1.2500 mg | INHALATION_SOLUTION | RESPIRATORY_TRACT | 0 refills | Status: DC | PRN

## 2017-02-03 MED ORDER — ASPIRIN 81 MG PO CHEW
324.0000 mg | CHEWABLE_TABLET | Freq: Once | ORAL | Status: AC
  Administered 2017-02-03: 324 mg via ORAL
  Filled 2017-02-03: qty 4

## 2017-02-03 MED ORDER — ALBUTEROL SULFATE (2.5 MG/3ML) 0.083% IN NEBU: 2.5000 mg | INHALATION_SOLUTION | RESPIRATORY_TRACT | 0 refills | Status: DC | PRN

## 2017-02-03 MED ORDER — ALBUTEROL SULFATE (2.5 MG/3ML) 0.083% IN NEBU
5.0000 mg | INHALATION_SOLUTION | Freq: Once | RESPIRATORY_TRACT | Status: AC
  Administered 2017-02-03: 5 mg via RESPIRATORY_TRACT
  Filled 2017-02-03: qty 6

## 2017-02-03 MED ORDER — SODIUM CHLORIDE 0.9 % IV BOLUS (SEPSIS)
1000.0000 mL | Freq: Once | INTRAVENOUS | Status: AC
  Administered 2017-02-03: 1000 mL via INTRAVENOUS

## 2017-02-03 NOTE — ED Notes (Signed)
Alert, NAD, calm, interactive, resps e/u, speaking in clear shortened phrases, some increased wob noted, skin W&D, VSS, here for sob and diarrhea, when probed admits to CP and abd pain, minimizes sx, vague sparse description of sx, EDP at [facility]. Family x2 at [facility].

## 2017-02-03 NOTE — ED Notes (Signed)
EDP into room, prior to RN assessment, see MD notes, pending orders.   

## 2017-02-03 NOTE — ED Triage Notes (Signed)
PT presents to ED with c/o of shortness of breath for several days,  and diarrhea for the past 2 weeks.

## 2017-02-03 NOTE — Discharge Instructions (Signed)
Hold your next 2 doses of warfarin (coumadin) - tonight and tomorrow night. See your doct on 7/9 for a recheck of your INR level.

## 2017-02-03 NOTE — ED Provider Notes (Signed)
[facility] Provider Note   CSN: [id number] Arrival date & time: 02/03/17  2130  By signing my name below, I, Mayer Masker, attest that this documentation has been prepared under the direction and in the presence of Sherwood Gambler, MD. Electronically Signed: Mayer Masker, Scribe. 02/03/17. 10:04 PM.  History   Chief Complaint Chief Complaint  Patient presents with  . Shortness of Breath   The history is provided by the patient. No language interpreter was used.    HPI Comments: Emily Mitchell is a 71 y.o. female with PMHx of COPD and CHF with a PE in 2014 who presents to the Emergency Department complaining of constant, steadily worsening SOB for 3 days. She has associated mild CP for 3-4 days that she describes as if someone is sitting on her chest, however she also has a hard time describing the pain and any exacerbation or inciting factors. Has also had cough, diarrhea for 2 weeks (with 6-10 episodes/day), blood in stool for 3-4 days that has resolved, abdominal pain, and bilateral leg swelling. She has not tried albuterol for her SOB (she thinks hers is expired).. Pt is on warfarin and is taking it regularly. She went to her PCP who prescribed an unknown antibiotic for the diarrhea. She states her symptoms today feel similarly to both COPD and CHF and PE episodes she has had previously.  Past Medical History:  Diagnosis Date  . Anemia 07/05/2014  . Anxiety and depression 12/14/2008   Qualifier: Diagnosis of  By: [name] LPN, Almyra Free    . Arthritis   . Asthma   . Atypical chest pain 05/23/2015  . Carotid artery occlusion   . CHF (congestive heart failure) ([place])   . Chicken pox as a child  . Chronic kidney disease    Bright's Disease at age 53   . COPD (chronic obstructive pulmonary disease) ([place])   . GERD (gastroesophageal reflux disease)   . H. pylori infection 10/19/2013  . Hyperglycemia 03/14/2016  . Hyperlipidemia   . Hypertension   . Hypothyroidism   . Knee pain,  bilateral 12/17/2012  . Left hip pain 05/23/2015  . Low back pain 09/19/2015  . Macular degeneration of both eyes 12/16/2015   Right eye is wet Left Eye is dry Shots every 11 to 12 weeks in right eye at opthamologist office  . Medicare annual wellness visit, subsequent 02/21/2015  . Mumps 71 yrs old  . Preventative health care 03/14/2016  . Pulmonary emboli ([place]) 05/26/2013  . Tremor   . UTI (lower urinary tract infection) 10/19/2013    Patient Active Problem List   Diagnosis Date Noted  . Acute epigastric pain 01/16/2017  . Hyperglycemia 03/14/2016  . Preventative health care 03/14/2016  . Macular degeneration of both eyes 12/16/2015  . Essential hypertension 10/18/2015  . Acute systolic heart failure ([place]) 09/19/2015  . Exudative age-related macular degeneration of right eye with active choroidal neovascularization ([place]) 08/26/2015  . Right-sided carotid artery disease ([place]) 06/16/2015  . Left hip pain 05/23/2015  . Atypical chest pain 05/23/2015  . Medicare annual wellness visit, subsequent 02/21/2015  . Anemia 07/05/2014  . Essential and other specified forms of tremor 11/05/2013  . Lower urinary tract infectious disease 10/19/2013  . Hyperlipidemia, mixed 05/26/2013  . Pulmonary emboli ([place]) 05/26/2013  . Arthritis   . Chronic kidney disease   . Hypothyroidism   . CHF (congestive heart failure) ([place])   . Occlusion and stenosis of carotid artery without mention of cerebral infarction 12/17/2012  .  Knee pain, bilateral 12/17/2012  . Numbness of finger 12/17/2012  . Anxiety and depression 12/14/2008  . OSA (obstructive sleep apnea) 12/14/2008    Past Surgical History:  Procedure Laterality Date  . ABDOMINAL HYSTERECTOMY  1984  . CAROTID ENDARTERECTOMY Right 05/10/07   cea  . CATARACT EXTRACTION Left   . EYE SURGERY     b/l  . knees replaced  2005 and 2011   both knees  . WISDOM TOOTH EXTRACTION      OB History    No data available       Home Medications     Prior to Admission medications   Medication Sig Start Date End Date Taking? Authorizing Provider  acetaminophen (TYLENOL) 500 MG tablet Take 500 mg by mouth every 6 (six) hours as needed.    [provider]  albuterol (PROVENTIL HFA;VENTOLIN HFA) 108 (90 BASE) MCG/ACT inhaler Inhale 2 puffs into the lungs every 4 (four) hours as needed for wheezing.    [provider]  bismuth subsalicylate (PEPTO BISMOL) 262 MG chewable tablet Take 2 by mouth four times daily 01/18/17   Mosie Lukes, MD  calcium citrate-vitamin D (CITRACAL+D) 315-200 MG-UNIT per tablet Take 1 tablet by mouth daily.     [provider]  cetirizine (ZYRTEC) 10 MG tablet Take 1 tablet (10 mg total) by mouth daily as needed for allergies. 01/16/17   Mosie Lukes, MD  Cholecalciferol (D3 MAXIMUM STRENGTH) 5000 UNITS capsule Take 5,000 Units by mouth daily.    [provider]  Choline Fenofibrate (FENOFIBRIC ACID) 135 MG CPDR Take 135 mg by mouth daily. 09/14/16   Mosie Lukes, MD  citalopram (CELEXA) 20 MG tablet Take 2 tablets (40 mg total) by mouth daily. 09/14/16   Mosie Lukes, MD  clonazePAM (KLONOPIN) 1 MG tablet TAKE ONE TABLET BY MOUTH THREE TIMES DAILY AS NEEDED FOR ANXIETY 10/26/16   Mosie Lukes, MD  Cyanocobalamin (VITAMIN B-12 PO) Take by mouth.    [provider]  ezetimibe (ZETIA) 10 MG tablet Take 1 tablet (10 mg total) by mouth daily. 09/14/16   Mosie Lukes, MD  FISH OIL-KRILL OIL PO Take by mouth.    [provider]  furosemide (LASIX) 20 MG tablet Take 1 tablet (20 mg total) by mouth daily as needed for edema. 03/14/16   Mosie Lukes, MD  levalbuterol Penne Lash) 1.25 MG/0.5ML nebulizer solution Take 1.25 mg by nebulization every 4 (four) hours as needed for wheezing. 02/03/17   Sherwood Gambler, MD  levothyroxine (SYNTHROID, LEVOTHROID) 125 MCG tablet Take 1 tablet (125 mcg total) by mouth daily before breakfast. 09/14/16   Mosie Lukes, MD   metroNIDAZOLE (FLAGYL) 500 MG tablet Take 1 tablet (500 mg total) by mouth 4 (four) times daily. 01/18/17   Mosie Lukes, MD  montelukast (SINGULAIR) 10 MG tablet Take 1 tablet (10 mg total) by mouth at bedtime as needed. 01/16/17   Mosie Lukes, MD  nitroGLYCERIN (NITROSTAT) 0.4 MG SL tablet Place 1 tablet (0.4 mg total) under the tongue every 5 (five) minutes as needed for chest pain. 06/23/13   Mosie Lukes, MD  omeprazole (PRILOSEC) 20 MG capsule Take 1 capsule (20 mg total) by mouth 2 (two) times daily before a meal. 01/18/17   Mosie Lukes, MD  ranitidine (ZANTAC) 300 MG tablet TAKE 1 TABLET EVERY DAY WITH BREAKFAST 09/14/16   Mosie Lukes, MD  tetracycline (ACHROMYCIN,SUMYCIN) 500 MG capsule Take 1 capsule (500  mg total) by mouth 4 (four) times daily. 01/18/17   Mosie Lukes, MD  traMADol (ULTRAM) 50 MG tablet Take 1 tablet (50 mg total) by mouth every 8 (eight) hours as needed for moderate pain or severe pain. 01/16/17   Mosie Lukes, MD  valsartan (DIOVAN) 80 MG tablet Take 0.5 tablets (40 mg total) by mouth daily. 09/14/16   Mosie Lukes, MD  warfarin (COUMADIN) 5 MG tablet TAKE AS DIRECTED 12/21/16   Mosie Lukes, MD    Family History Family History  Problem Relation Age of Onset  . Stroke Mother        mini stroke  . Kidney disease Mother   . Heart failure Mother   . Hypertension Mother   . Diabetes Sister        type 2  . Kidney disease Sister   . Heart attack Maternal Grandfather   . Hyperlipidemia Sister     Social History Social History  Substance Use Topics  . Smoking status: Former Smoker    Packs/day: 2.00    Years: 30.00    Types: Cigarettes    Quit date: 12/03/1994  . Smokeless tobacco: Never Used  . Alcohol use Yes     Comment: rarely, 1/month     Allergies   Niaspan [niacin er]; Pantoprazole; Bupropion; Penicillins; Statins; Sulfonamide derivatives; Apple; Banana; and Milk-related compounds   Review of Systems Review of Systems   Respiratory: Positive for cough and shortness of breath.   Cardiovascular: Positive for chest pain and leg swelling.  Gastrointestinal: Positive for abdominal pain, blood in stool and diarrhea.  All other systems reviewed and are negative.    Physical Exam Updated Vital Signs BP (!) 135/105 Comment: tremulous  Pulse 72   Temp 98.3 F (36.8 C) (Oral)   Resp 15   SpO2 99%   Physical Exam  Constitutional: She is oriented to person, place, and time. She appears well-developed and well-nourished.  HENT:  Head: Normocephalic and atraumatic.  Right Ear: External ear normal.  Left Ear: External ear normal.  Nose: Nose normal.  Eyes: Right eye exhibits no discharge. Left eye exhibits no discharge.  Cardiovascular: Normal rate, regular rhythm, normal heart sounds and intact distal pulses.   Pulmonary/Chest: Effort normal and breath sounds normal. No accessory muscle usage. Tachypnea noted. She has no wheezes.  Abdominal: Soft. There is no tenderness.  Musculoskeletal: She exhibits no edema.  Neurological: She is alert and oriented to person, place, and time.  Diffuse tremors  Skin: Skin is warm and dry.  Nursing note and vitals reviewed.    ED Treatments / Results  DIAGNOSTIC STUDIES: Oxygen Saturation is 100% on RA, normal by my interpretation.    COORDINATION OF CARE: 10:00 PM Discussed treatment plan with pt at bedside and pt agreed to plan.  Labs (all labs ordered are listed, but only abnormal results are displayed) Labs Reviewed  BASIC METABOLIC PANEL - Abnormal; Notable for the following:       Result Value   Glucose, Bld 106 (*)    BUN 39 (*)    Creatinine, Ser 1.92 (*)    GFR calc non Af Amer 26 (*)    GFR calc Af Amer 30 (*)    All other components within normal limits  PROTIME-INR - Abnormal; Notable for the following:    Prothrombin Time 54.2 (*)    INR 5.84 (*)    All other components within normal limits  TROPONIN I  CBC WITH DIFFERENTIAL/PLATELET  BRAIN NATRIURETIC PEPTIDE    EKG  EKG Interpretation  Date/Time:  Saturday February 03 2017 21:56:33 EDT Ventricular Rate:  68 PR Interval:    QRS Duration: 92 QT Interval:  426 QTC Calculation: 460 R Axis:   36 Text Interpretation:  Sinus rhythm Prolonged PR interval Low voltage, precordial leads Abnormal R-wave progression, early transition no significant change since 2011 Confirmed by Sherwood Gambler [phone]) on 02/03/2017 10:02:09 PM       Radiology Dg Chest 2 View  Result Date: 02/03/2017 CLINICAL DATA:  Chest pain and shortness of breath. EXAM: CHEST  2 VIEW COMPARISON:  Radiographs 05/12/2013 FINDINGS: The cardiomediastinal contours are normal. Mild atherosclerosis of the aortic arch. Mild hyperinflation, chronic. Left basilar calcifications likely calcified granuloma, unchanged. Pulmonary vasculature is normal. No consolidation, pleural effusion, or pneumothorax. No acute osseous abnormalities are seen. IMPRESSION: 1. Mild hyperinflation can be seen with asthma, bronchitis, or smoking related lung disease. 2. No acute abnormality.  Thoracic aortic atherosclerosis. Electronically Signed   By: Jeb Levering M.D.   On: 02/03/2017 22:28    Procedures Procedures (including critical care time)  Medications Ordered in ED Medications  aspirin chewable tablet 324 mg (324 mg Oral Given 02/03/17 2206)  albuterol (PROVENTIL) (2.5 MG/3ML) 0.083% nebulizer solution 5 mg (5 mg Nebulization Given 02/03/17 2208)  sodium chloride 0.9 % bolus 1,000 mL (1,000 mLs Intravenous New Bag/Given 02/03/17 2248)     Initial Impression / Assessment and Plan / ED Course  I have reviewed the triage vital signs and the nursing notes.  Pertinent labs & imaging results that were available during my care of the patient were reviewed by me and considered in my medical decision making (see chart for details).     Patient now feels like her shortness of breath has resolved. She's not sure if it was the albuterol  or IV fluids. Initially her blood pressure was okay but did seem to drop into the 90s and high 80s. They could be that she short of breath from dehydration from the diarrhea. After just less than 500 mL of IV fluids her blood pressure has normalized and stayed okay or even high. Chest pain also is resolved. Her chest pain is atypical. Shortness of breath now back to her baseline dyspnea from COPD. Her INR is supratherapeutic, no current bleeding. Given this, I have low suspicion for a PE and do not think imaging is needed at this time. She would like a refill of her Xopenex because she thinks hers is expired. Use this as needed. I think ACS is less likely. No evidence of CHF. I offered observation admission versus going home and she wants to go home. Given 3 days of symptoms by suspicion for ACS is a lot lower. Discussed strict return precautions and need for close follow-up with PCP for her super therapeutic on our. She will hold her tonight's Coumadin dose and tomorrow.   Final Clinical Impressions(s) / ED Diagnoses   Final diagnoses:  Acute dyspnea  Supratherapeutic INR    New Prescriptions Current Discharge Medication List     I personally performed the services described in this documentation, which was scribed in my presence. The recorded information has been reviewed and is accurate.    Sherwood Gambler, MD 02/03/17 [phone]

## 2017-02-03 NOTE — ED Notes (Addendum)
Dr. Regenia Skeeter and primary nurse Wille Glaser that INR is 5.84.

## 2017-02-04 NOTE — ED Notes (Addendum)
No changes. IVF infusing. Alert, NAD, calm, interactive, resps e/u, speaking in clear complete sentences, no dyspnea noted, VSS. Family at [facility]. Updated.

## 2017-02-05 ENCOUNTER — Ambulatory Visit (INDEPENDENT_AMBULATORY_CARE_PROVIDER_SITE_OTHER): Payer: Medicare HMO | Admitting: Family Medicine

## 2017-02-05 VITALS — BP 124/71 | HR 71 | Temp 98.3°F | Resp 18 | Wt 178.4 lb

## 2017-02-05 DIAGNOSIS — K529 Noninfective gastroenteritis and colitis, unspecified: Secondary | ICD-10-CM

## 2017-02-05 DIAGNOSIS — R0602 Shortness of breath: Secondary | ICD-10-CM

## 2017-02-05 DIAGNOSIS — R739 Hyperglycemia, unspecified: Secondary | ICD-10-CM | POA: Diagnosis not present

## 2017-02-05 DIAGNOSIS — I2609 Other pulmonary embolism with acute cor pulmonale: Secondary | ICD-10-CM

## 2017-02-05 DIAGNOSIS — E039 Hypothyroidism, unspecified: Secondary | ICD-10-CM | POA: Diagnosis not present

## 2017-02-05 DIAGNOSIS — R197 Diarrhea, unspecified: Secondary | ICD-10-CM | POA: Diagnosis not present

## 2017-02-05 DIAGNOSIS — I10 Essential (primary) hypertension: Secondary | ICD-10-CM

## 2017-02-05 DIAGNOSIS — N189 Chronic kidney disease, unspecified: Secondary | ICD-10-CM | POA: Diagnosis not present

## 2017-02-05 LAB — POCT INR: INR: 2

## 2017-02-05 MED ORDER — PT/INR TEST VI STRP: ORAL_STRIP | 0 refills | Status: DC

## 2017-02-05 MED ORDER — PT/INR TESTING MONITOR KIT: PACK | 0 refills | Status: DC

## 2017-02-05 NOTE — Progress Notes (Signed)
Subjective:  I acted as a Education administrator for Dr. Charlett Blake. [name], [place]  Patient ID: Emily Mitchell, female    DOB: Feb 16, 1946, 71 y.o.   MRN: [id number]  No chief complaint on file.   HPI  Patient is in today for a hospital follow up. Patient states she still feels weak since her ER visit. She reports a three week history of frequent loose stool. She reports 3-5 watery movements daily. Her son accompanies her today and notes he had similar symptoms but they have resolved. At this point the  We will proceed with stool cultures and if her symptoms do not continue to improve. For now she will frequency and amount of the stool is lessening. She was seen in the ED and diagnosed with dehydration acute kidney injury hydrated and sent home. She reports she feels better since that time but not fully recovered. In the ER her INR was supratherpeutic and she has held her coumadin dose for 2 days. No bloody or tarry stool no signs of bleeding noted  Patient Care Team: Mosie Lukes, MD as PCP - General (Family Medicine) Rigoberto Noel, MD as Consulting Physician (Pulmonary Disease) Donato Heinz, MD as Consulting Physician (Nephrology) Tat, Eustace Quail, DO as Consulting Physician (Neurology) Antionette Fairy Isaias Cowman, MD as Consulting Physician (Ophthalmology)   Past Medical History:  Diagnosis Date  . Anemia 07/05/2014  . Anxiety and depression 12/14/2008   Qualifier: Diagnosis of  By: [name] LPN, Almyra Free    . Arthritis   . Asthma   . Atypical chest pain 05/23/2015  . Carotid artery occlusion   . CHF (congestive heart failure) ([place])   . Chicken pox as a child  . Chronic kidney disease    Bright's Disease at age 8   . COPD (chronic obstructive pulmonary disease) ([place])   . GERD (gastroesophageal reflux disease)   . H. pylori infection 10/19/2013  . Hyperglycemia 03/14/2016  . Hyperlipidemia   . Hypertension   . Hypothyroidism   . Knee pain, bilateral 12/17/2012  . Left hip pain 05/23/2015  . Low back  pain 09/19/2015  . Macular degeneration of both eyes 12/16/2015   Right eye is wet Left Eye is dry Shots every 11 to 12 weeks in right eye at opthamologist office  . Medicare annual wellness visit, subsequent 02/21/2015  . Mumps 71 yrs old  . Preventative health care 03/14/2016  . Pulmonary emboli ([place]) 05/26/2013  . Tremor   . UTI (lower urinary tract infection) 10/19/2013    Past Surgical History:  Procedure Laterality Date  . ABDOMINAL HYSTERECTOMY  1984  . CAROTID ENDARTERECTOMY Right 05/10/07   cea  . CATARACT EXTRACTION Left   . EYE SURGERY     b/l  . knees replaced  2005 and 2011   both knees  . WISDOM TOOTH EXTRACTION      Family History  Problem Relation Age of Onset  . Stroke Mother        mini stroke  . Kidney disease Mother   . Heart failure Mother   . Hypertension Mother   . Diabetes Sister        type 2  . Kidney disease Sister   . Heart attack Maternal Grandfather   . Hyperlipidemia Sister     Social History   Social History  . Marital status: Divorced    Spouse name: N/A  . Number of children: 2  . Years of education: N/A   Occupational History  . Not on  file.   Social History Main Topics  . Smoking status: Former Smoker    Packs/day: 2.00    Years: 30.00    Types: Cigarettes    Quit date: 12/03/1994  . Smokeless tobacco: Never Used  . Alcohol use Yes     Comment: rarely, 1/month  . Drug use: No  . Sexual activity: No     Comment: lives with son and friend, no dietary restrictions   Other Topics Concern  . Not on file   Social History Narrative   epworth sleepiness scale = 11 (06/15/2015)    Outpatient Medications Prior to Visit  Medication Sig Dispense Refill  . acetaminophen (TYLENOL) 500 MG tablet Take 500 mg by mouth every 6 (six) hours as needed.    Marland Kitchen albuterol (PROVENTIL HFA;VENTOLIN HFA) 108 (90 BASE) MCG/ACT inhaler Inhale 2 puffs into the lungs every 4 (four) hours as needed for wheezing.    . bismuth subsalicylate (PEPTO  BISMOL) 262 MG chewable tablet Take 2 by mouth four times daily 112 tablet 0  . calcium citrate-vitamin D (CITRACAL+D) 315-200 MG-UNIT per tablet Take 1 tablet by mouth daily.     . cetirizine (ZYRTEC) 10 MG tablet Take 1 tablet (10 mg total) by mouth daily as needed for allergies. 30 tablet 11  . Cholecalciferol (D3 MAXIMUM STRENGTH) 5000 UNITS capsule Take 5,000 Units by mouth daily.    . Choline Fenofibrate (FENOFIBRIC ACID) 135 MG CPDR Take 135 mg by mouth daily. 90 capsule 1  . citalopram (CELEXA) 20 MG tablet Take 2 tablets (40 mg total) by mouth daily. 180 tablet 1  . clonazePAM (KLONOPIN) 1 MG tablet TAKE ONE TABLET BY MOUTH THREE TIMES DAILY AS NEEDED FOR ANXIETY 90 tablet 2  . Cyanocobalamin (VITAMIN B-12 PO) Take by mouth.    . ezetimibe (ZETIA) 10 MG tablet Take 1 tablet (10 mg total) by mouth daily. 90 tablet 1  . FISH OIL-KRILL OIL PO Take by mouth.    . furosemide (LASIX) 20 MG tablet Take 1 tablet (20 mg total) by mouth daily as needed for edema. 30 tablet 3  . levalbuterol (XOPENEX) 1.25 MG/0.5ML nebulizer solution Take 1.25 mg by nebulization every 4 (four) hours as needed for wheezing. 30 each 0  . levothyroxine (SYNTHROID, LEVOTHROID) 125 MCG tablet Take 1 tablet (125 mcg total) by mouth daily before breakfast. 90 tablet 1  . metroNIDAZOLE (FLAGYL) 500 MG tablet Take 1 tablet (500 mg total) by mouth 4 (four) times daily. 56 tablet 0  . montelukast (SINGULAIR) 10 MG tablet Take 1 tablet (10 mg total) by mouth at bedtime as needed. 65 tablet 1  . nitroGLYCERIN (NITROSTAT) 0.4 MG SL tablet Place 1 tablet (0.4 mg total) under the tongue every 5 (five) minutes as needed for chest pain. 25 tablet 3  . omeprazole (PRILOSEC) 20 MG capsule Take 1 capsule (20 mg total) by mouth 2 (two) times daily before a meal. 28 capsule 0  . ranitidine (ZANTAC) 300 MG tablet TAKE 1 TABLET EVERY DAY WITH BREAKFAST 90 tablet 1  . tetracycline (ACHROMYCIN,SUMYCIN) 500 MG capsule Take 1 capsule (500 mg  total) by mouth 4 (four) times daily. 56 capsule 0  . traMADol (ULTRAM) 50 MG tablet Take 1 tablet (50 mg total) by mouth every 8 (eight) hours as needed for moderate pain or severe pain. 90 tablet 0  . valsartan (DIOVAN) 80 MG tablet Take 0.5 tablets (40 mg total) by mouth daily. 90 tablet 1  . warfarin (COUMADIN) 5 MG  tablet TAKE AS DIRECTED 90 tablet 1   No facility-administered medications prior to visit.     Allergies  Allergen Reactions  . Niaspan [Niacin Er] Nausea And Vomiting and Swelling    Swelling in mouth  . Pantoprazole     Mouth sores  . Bupropion Other (See Comments)    Uncontrollable shakes  . Penicillins Hives  . Statins   . Sulfonamide Derivatives Hives  . Apple Rash  . Banana Rash  . Milk-Related Compounds Rash    Review of Systems  Constitutional: Positive for malaise/fatigue. Negative for fever.  HENT: Negative for congestion.   Eyes: Negative for blurred vision.  Respiratory: Negative for cough and shortness of breath.   Cardiovascular: Negative for chest pain, palpitations and leg swelling.  Gastrointestinal: Positive for diarrhea. Negative for abdominal pain, blood in stool, constipation, melena and vomiting.  Musculoskeletal: Negative for back pain.  Skin: Negative for rash.  Neurological: Positive for weakness. Negative for loss of consciousness and headaches.       Objective:    Physical Exam  Constitutional: She is oriented to person, place, and time. She appears well-developed and well-nourished. No distress.  HENT:  Head: Normocephalic and atraumatic.  Eyes: Conjunctivae are normal.  Neck: Normal range of motion. No thyromegaly present.  Cardiovascular: Normal rate and regular rhythm.   Pulmonary/Chest: Effort normal and breath sounds normal. She has no wheezes.  Abdominal: Soft. Bowel sounds are normal. There is no tenderness.  Musculoskeletal: Normal range of motion. She exhibits no edema or deformity.  Neurological: She is alert and  oriented to person, place, and time.  Skin: Skin is warm and dry. She is not diaphoretic.  Psychiatric: She has a normal mood and affect.    BP 124/71 (BP Location: Left Arm, Patient Position: Sitting, Cuff Size: Normal)   Pulse 71   Temp 98.3 F (36.8 C) (Oral)   Resp 18   Wt 178 lb 6.4 oz (80.9 kg)   SpO2 98%   BMI 31.60 kg/m  Wt Readings from Last 3 Encounters:  02/05/17 178 lb 6.4 oz (80.9 kg)  01/16/17 183 lb 12.8 oz (83.4 kg)  09/14/16 183 lb (83 kg)   BP Readings from Last 3 Encounters:  02/05/17 124/71  02/04/17 100/62  01/16/17 128/68     Immunization History  Administered Date(s) Administered  . Influenza Split 04/29/2013  . Influenza, High Dose Seasonal PF 03/30/2016  . Influenza,inj,Quad PF,36+ Mos 03/26/2014, 05/17/2015  . Pneumococcal Conjugate-13 04/29/2013  . Pneumococcal Polysaccharide-23 04/30/2014  . Tdap 05/26/2013  . Zoster 07/31/2012    Health Maintenance  Topic Date Due  . INFLUENZA VACCINE  02/28/2017  . MAMMOGRAM  08/15/2018  . COLONOSCOPY  09/25/2018  . TETANUS/TDAP  05/27/2023  . DEXA SCAN  Completed  . Hepatitis C Screening  Completed  . PNA vac Low Risk Adult  Completed    Lab Results  Component Value Date   WBC 7.4 02/03/2017   HGB 12.9 02/03/2017   HCT 38.1 02/03/2017   PLT 164 02/03/2017   GLUCOSE 106 (H) 02/03/2017   CHOL 192 12/21/2016   TRIG 258.0 (H) 12/21/2016   HDL 45.40 12/21/2016   LDLDIRECT 109.0 12/21/2016   LDLCALC 101 (H) 09/14/2016   ALT 13 09/14/2016   AST 20 09/14/2016   NA 138 02/03/2017   K 4.1 02/03/2017   CL 103 02/03/2017   CREATININE 1.92 (H) 02/03/2017   BUN 39 (H) 02/03/2017   CO2 26 02/03/2017   TSH 0.55 12/21/2016  INR 2.0 02/05/2017   HGBA1C 5.3 12/21/2016    Lab Results  Component Value Date   TSH 0.55 12/21/2016   Lab Results  Component Value Date   WBC 7.4 02/03/2017   HGB 12.9 02/03/2017   HCT 38.1 02/03/2017   MCV 90.9 02/03/2017   PLT 164 02/03/2017   Lab Results    Component Value Date   NA 138 02/03/2017   K 4.1 02/03/2017   CO2 26 02/03/2017   GLUCOSE 106 (H) 02/03/2017   BUN 39 (H) 02/03/2017   CREATININE 1.92 (H) 02/03/2017   BILITOT 0.4 09/14/2016   ALKPHOS 39 09/14/2016   AST 20 09/14/2016   ALT 13 09/14/2016   PROT 7.1 09/14/2016   ALBUMIN 4.3 09/14/2016   CALCIUM 9.3 02/03/2017   ANIONGAP 9 02/03/2017   GFR 25.99 (L) 09/14/2016   Lab Results  Component Value Date   CHOL 192 12/21/2016   Lab Results  Component Value Date   HDL 45.40 12/21/2016   Lab Results  Component Value Date   LDLCALC 101 (H) 09/14/2016   Lab Results  Component Value Date   TRIG 258.0 (H) 12/21/2016   Lab Results  Component Value Date   CHOLHDL 4 12/21/2016   Lab Results  Component Value Date   HGBA1C 5.3 12/21/2016         Assessment & Plan:   Problem List Items Addressed This Visit    Chronic kidney disease    Worse with recent dehydration. Reminded to maintain good hydration at all times if possible      Hypothyroidism    On Levothyroxine, continue to monitor      Pulmonary emboli ([place]) - Primary    Recently seen in ED with supratherapeutic INR over 5 and her coumadin was held for 2 days now her INR is 2 restart but decreased coumadin 5 mg tabs to 1/2 tab 4 days a week and 1 tab 3 days a week. Monitor closely. Take full tab today      Relevant Medications   PT/INR Testing Monitor KIT   PT/INR Test STRP   Other Relevant Orders   POCT INR (Completed)   INR/PT   Essential hypertension    Well controlled, no changes to meds. Encouraged heart healthy diet such as the DASH diet and exercise as tolerated.       Relevant Orders   ECHOCARDIOGRAM COMPLETE   Hyperglycemia    minimize simple carbs. Increase exercise as tolerated.       Gastroenteritis    She reports a three week history of frequent loose stool. She reports 3-5 watery movements daily. Her son accompanies her today and notes he had similar symptoms but they have  resolved. At this point the  We will proceed with stool cultures and if her symptoms do not continue to improve. For now she will frequency and amount of the stool is lessening. She was seen in the ED and diagnosed with dehydration acute kidney injury hydrated and sent home. She reports she feels better since that time but not fully recovered.maintain her clear fluid BRAT diet and report any concerns.       Other Visit Diagnoses    SOB (shortness of breath)       Relevant Orders   ECHOCARDIOGRAM COMPLETE   Diarrhea, unspecified type       Relevant Orders   CBC w/Diff   Sed Rate (ESR)      I am having Ms. Kilpatrick start on PT/INR Testing  Monitor and PT/INR Test. I am also having her maintain her calcium citrate-vitamin D, Cholecalciferol, albuterol, nitroGLYCERIN, Cyanocobalamin (VITAMIN B-12 PO), acetaminophen, furosemide, citalopram, Fenofibric Acid, levothyroxine, ranitidine, valsartan, ezetimibe, clonazePAM, warfarin, FISH OIL-KRILL OIL PO, traMADol, montelukast, cetirizine, metroNIDAZOLE, omeprazole, tetracycline, bismuth subsalicylate, and levalbuterol.  Meds ordered this encounter  Medications  . PT/INR Testing Monitor KIT    Sig: Check INR every 4 weeks prn Dz: l26.99    Dispense:  1 each    Refill:  0  . PT/INR Test STRP    Sig: Check INR every 4 weeks,  PRN Dz:I26.99    Dispense:  48 each    Refill:  0    CMA served as scribe during this visit. History, Physical and Plan performed by medical provider. Documentation and orders reviewed and attested to.  Penni Homans, MD

## 2017-02-05 NOTE — Patient Instructions (Signed)
Coumadin 5 mg tabs 1/2 tab daily 4 days a week( ie m, w, f, sat Bland Diet A bland diet consists of foods that do not have a lot of fat or fiber. Foods without fat or fiber are easier for the body to digest. They are also less likely to irritate your mouth, throat, stomach, and other parts of your gastrointestinal tract. A bland diet is sometimes called a BRAT diet. What is my plan? Your health care provider or dietitian may recommend specific changes to your diet to prevent and treat your symptoms, such as:  Eating small meals often.  Cooking food until it is soft enough to chew easily.  Chewing your food well.  Drinking fluids slowly.  Not eating foods that are very spicy, sour, or fatty.  Not eating citrus fruits, such as oranges and grapefruit.  What do I need to know about this diet?  Eat a variety of foods from the bland diet food list.  Do not follow a bland diet longer than you have to.  Ask your health care provider whether you should take vitamins. What foods can I eat? Grains  Hot cereals, such as cream of wheat. Bread, crackers, or tortillas made from refined white flour. Rice. Vegetables Canned or cooked vegetables. Mashed or boiled potatoes. Fruits Bananas. Applesauce. Other types of cooked or canned fruit with the skin and seeds removed, such as canned peaches or pears. Meats and Other Protein Sources Scrambled eggs. Creamy peanut butter or other nut butters. Lean, well-cooked meats, such as chicken or fish. Tofu. Soups or broths. Dairy Low-fat dairy products, such as milk, cottage cheese, or yogurt. Beverages Water. Herbal tea. Apple juice. Sweets and Desserts Pudding. Custard. Fruit gelatin. Ice cream. Fats and Oils Mild salad dressings. Canola or olive oil. The items listed above may not be a complete list of allowed foods or beverages. Contact your dietitian for more options. What foods are not recommended? Foods and ingredients that are often not  recommended include:  Spicy foods, such as hot sauce or salsa.  Fried foods.  Sour foods, such as pickled or fermented foods.  Raw vegetables or fruits, especially citrus or berries.  Caffeinated drinks.  Alcohol.  Strongly flavored seasonings or condiments.  The items listed above may not be a complete list of foods and beverages that are not allowed. Contact your dietitian for more information. This information is not intended to replace advice given to you by your health care provider. Make sure you discuss any questions you have with your health care provider. Document Released: 11/08/2015 Document Revised: 12/23/2015 Document Reviewed: 07/29/2014 Elsevier Interactive Patient Education  [employer].  and full tab daily 3 days a week

## 2017-02-06 DIAGNOSIS — K529 Noninfective gastroenteritis and colitis, unspecified: Secondary | ICD-10-CM | POA: Insufficient documentation

## 2017-02-06 DIAGNOSIS — R69 Illness, unspecified: Secondary | ICD-10-CM | POA: Diagnosis not present

## 2017-02-06 NOTE — Assessment & Plan Note (Signed)
Well controlled, no changes to meds. Encouraged heart healthy diet such as the DASH diet and exercise as tolerated.  °

## 2017-02-06 NOTE — Assessment & Plan Note (Signed)
minimize simple carbs. Increase exercise as tolerated.  

## 2017-02-06 NOTE — Assessment & Plan Note (Signed)
Recently seen in ED with supratherapeutic INR over 5 and her coumadin was held for 2 days now her INR is 2 restart but decreased coumadin 5 mg tabs to 1/2 tab 4 days a week and 1 tab 3 days a week. Monitor closely. Take full tab today

## 2017-02-06 NOTE — Assessment & Plan Note (Addendum)
She reports a three week history of frequent loose stool. She reports 3-5 watery movements daily. Her son accompanies her today and notes he had similar symptoms but they have resolved. At this point the  We will proceed with stool cultures and if her symptoms do not continue to improve. For now she will frequency and amount of the stool is lessening. She was seen in the ED and diagnosed with dehydration acute kidney injury hydrated and sent home. She reports she feels better since that time but not fully recovered.maintain her clear fluid BRAT diet and report any concerns.

## 2017-02-06 NOTE — Assessment & Plan Note (Signed)
On Levothyroxine, continue to monitor 

## 2017-02-06 NOTE — Assessment & Plan Note (Signed)
Worse with recent dehydration. Reminded to maintain good hydration at all times if possible

## 2017-02-08 ENCOUNTER — Telehealth: Payer: Self-pay | Admitting: *Deleted

## 2017-02-08 ENCOUNTER — Other Ambulatory Visit (INDEPENDENT_AMBULATORY_CARE_PROVIDER_SITE_OTHER): Payer: Medicare HMO

## 2017-02-08 ENCOUNTER — Other Ambulatory Visit: Payer: Self-pay | Admitting: Family Medicine

## 2017-02-08 DIAGNOSIS — R197 Diarrhea, unspecified: Secondary | ICD-10-CM

## 2017-02-08 DIAGNOSIS — I2609 Other pulmonary embolism with acute cor pulmonale: Secondary | ICD-10-CM

## 2017-02-08 DIAGNOSIS — N649 Disorder of breast, unspecified: Secondary | ICD-10-CM

## 2017-02-08 LAB — CBC WITH DIFFERENTIAL/PLATELET
BASOS PCT: 1 % (ref 0.0–3.0)
Basophils Absolute: 0.1 10*3/uL (ref 0.0–0.1)
Eosinophils Absolute: 0.2 10*3/uL (ref 0.0–0.7)
Eosinophils Relative: 3.1 % (ref 0.0–5.0)
HCT: 35.1 % — ABNORMAL LOW (ref 36.0–46.0)
HEMOGLOBIN: 11.7 g/dL — AB (ref 12.0–15.0)
LYMPHS PCT: 35 % (ref 12.0–46.0)
Lymphs Abs: 1.8 10*3/uL (ref 0.7–4.0)
MCHC: 33.2 g/dL (ref 30.0–36.0)
MCV: 91.5 fl (ref 78.0–100.0)
MONO ABS: 0.4 10*3/uL (ref 0.1–1.0)
Monocytes Relative: 8 % (ref 3.0–12.0)
Neutro Abs: 2.7 10*3/uL (ref 1.4–7.7)
Neutrophils Relative %: 52.9 % (ref 43.0–77.0)
Platelets: 169 10*3/uL (ref 150.0–400.0)
RBC: 3.84 Mil/uL — AB (ref 3.87–5.11)
RDW: 14.1 % (ref 11.5–15.5)
WBC: 5 10*3/uL (ref 4.0–10.5)

## 2017-02-08 LAB — SEDIMENTATION RATE: SED RATE: 1 mm/h (ref 0–30)

## 2017-02-08 LAB — PROTIME-INR
INR: 1.3 ratio — AB (ref 0.8–1.0)
PROTHROMBIN TIME: 14.1 s — AB (ref 9.6–13.1)

## 2017-02-08 NOTE — Progress Notes (Signed)
Orders placed for patient for Mammogram and Ultrasound of the breast.

## 2017-02-08 NOTE — Telephone Encounter (Signed)
Received request for Physician Orders from [facility], pt is scheduled for bilateral diagnostic Mammogram & [place] breast on 02/14/17; forwarded to provider/SLS 07/12

## 2017-02-14 DIAGNOSIS — R922 Inconclusive mammogram: Secondary | ICD-10-CM | POA: Diagnosis not present

## 2017-02-14 DIAGNOSIS — N6320 Unspecified lump in the left breast, unspecified quadrant: Secondary | ICD-10-CM | POA: Diagnosis not present

## 2017-02-14 DIAGNOSIS — N6322 Unspecified lump in the left breast, upper inner quadrant: Secondary | ICD-10-CM | POA: Diagnosis not present

## 2017-02-15 ENCOUNTER — Ambulatory Visit (HOSPITAL_BASED_OUTPATIENT_CLINIC_OR_DEPARTMENT_OTHER)
Admission: RE | Admit: 2017-02-15 | Discharge: 2017-02-15 | Disposition: A | Discharge status: Home / Self Care | Payer: Medicare HMO | Source: Ambulatory Visit | Attending: Family Medicine | Admitting: Family Medicine

## 2017-02-15 DIAGNOSIS — I371 Nonrheumatic pulmonary valve insufficiency: Secondary | ICD-10-CM | POA: Insufficient documentation

## 2017-02-15 DIAGNOSIS — K529 Noninfective gastroenteritis and colitis, unspecified: Secondary | ICD-10-CM | POA: Diagnosis not present

## 2017-02-15 DIAGNOSIS — Z86711 Personal history of pulmonary embolism: Secondary | ICD-10-CM | POA: Insufficient documentation

## 2017-02-15 DIAGNOSIS — I10 Essential (primary) hypertension: Secondary | ICD-10-CM | POA: Diagnosis present

## 2017-02-15 DIAGNOSIS — R0602 Shortness of breath: Secondary | ICD-10-CM | POA: Insufficient documentation

## 2017-02-15 DIAGNOSIS — I340 Nonrheumatic mitral (valve) insufficiency: Secondary | ICD-10-CM | POA: Insufficient documentation

## 2017-02-15 DIAGNOSIS — N189 Chronic kidney disease, unspecified: Secondary | ICD-10-CM | POA: Diagnosis not present

## 2017-02-15 DIAGNOSIS — I129 Hypertensive chronic kidney disease with stage 1 through stage 4 chronic kidney disease, or unspecified chronic kidney disease: Secondary | ICD-10-CM | POA: Insufficient documentation

## 2017-02-15 NOTE — Progress Notes (Signed)
  Echocardiogram 2D Echocardiogram has been performed.  [name] 02/15/2017, 12:23 PM

## 2017-02-20 ENCOUNTER — Ambulatory Visit: Payer: Medicare HMO

## 2017-02-20 DIAGNOSIS — H353211 Exudative age-related macular degeneration, right eye, with active choroidal neovascularization: Secondary | ICD-10-CM | POA: Diagnosis not present

## 2017-02-20 DIAGNOSIS — H353121 Nonexudative age-related macular degeneration, left eye, early dry stage: Secondary | ICD-10-CM | POA: Diagnosis not present

## 2017-02-22 ENCOUNTER — Ambulatory Visit (INDEPENDENT_AMBULATORY_CARE_PROVIDER_SITE_OTHER): Payer: Medicare HMO | Admitting: Behavioral Health

## 2017-02-22 DIAGNOSIS — Z86711 Personal history of pulmonary embolism: Secondary | ICD-10-CM | POA: Diagnosis not present

## 2017-02-22 LAB — POCT INR: INR: 1

## 2017-02-22 NOTE — Patient Instructions (Signed)
Per Mackie Pai, PA-C: Take Coumadin 7.5 mg today & tomorrow; 5 mg on Saturday & Sunday and 2.5 mg on Monday. Return on Tuesday, 02/27/17 for INR check.

## 2017-02-22 NOTE — Progress Notes (Addendum)
Pre visit review using our clinic review tool, if applicable. No additional management support is needed unless otherwise documented below in the visit note.  Patient presents in clinic for INR check. She voiced adherence to medication & current regimen. Patient reported that she had not been consuming any green vegetables until recently; the week before last she ate one serving of turnip greens & a salad yesterday evening. Today's INR reading was 1.0.  Per Mackie Pai, PA-C: Take Coumadin 7.5 mg today & tomorrow; 5 mg on Saturday & Sunday and 2.5 mg on Monday. Return on Tuesday, 02/27/17 for INR check.  Informed patient of the provider's recommendations. She verbalized understanding & did not have any questions or concerns before leaving the nurse visit.  This advise that I gave after discussion and verifying twice that inr was 1.0. Also pt states compliant on recent regimen.  [place], Percell Miller, PA-C

## 2017-02-27 ENCOUNTER — Ambulatory Visit (INDEPENDENT_AMBULATORY_CARE_PROVIDER_SITE_OTHER): Payer: Medicare HMO | Admitting: Behavioral Health

## 2017-02-27 ENCOUNTER — Other Ambulatory Visit: Payer: Self-pay | Admitting: Family Medicine

## 2017-02-27 DIAGNOSIS — Z86711 Personal history of pulmonary embolism: Secondary | ICD-10-CM

## 2017-02-27 DIAGNOSIS — N2581 Secondary hyperparathyroidism of renal origin: Secondary | ICD-10-CM | POA: Diagnosis not present

## 2017-02-27 DIAGNOSIS — N189 Chronic kidney disease, unspecified: Secondary | ICD-10-CM | POA: Diagnosis not present

## 2017-02-27 DIAGNOSIS — N183 Chronic kidney disease, stage 3 (moderate): Secondary | ICD-10-CM | POA: Diagnosis not present

## 2017-02-27 LAB — POCT INR: INR: 1.6

## 2017-02-27 MED ORDER — LOSARTAN POTASSIUM 25 MG PO TABS: 25.0000 mg | ORAL_TABLET | Freq: Every day | ORAL | 1 refills | Status: DC

## 2017-02-27 NOTE — Progress Notes (Signed)
Pre visit review using our clinic review tool, if applicable. No additional management support is needed unless otherwise documented below in the visit note.  Patient came in clinic for INR recheck. She adheres to medication & current regimen. Patient reported no changes since this last visit. Today's INR reading was 1.6.  Per Dr. Charlett Blake: Take Coumadin 5 mg daily, except on Monday, Wednesday & Friday take 2.5 mg. Return in 1 week for INR check.  Patient was made aware of the provider's instructions. She verbalized understanding. Next appointment scheduled for 03/06/17 at 2:00 PM.

## 2017-02-27 NOTE — Progress Notes (Unsigned)
[place] d/c

## 2017-02-27 NOTE — Patient Instructions (Signed)
Per Dr. Charlett Blake: Take Coumadin 5 mg daily, except on Monday, Wednesday & Friday take 2.5 mg. Return in 1 week for INR check.

## 2017-03-05 ENCOUNTER — Other Ambulatory Visit: Payer: Self-pay | Admitting: Family Medicine

## 2017-03-05 DIAGNOSIS — E785 Hyperlipidemia, unspecified: Secondary | ICD-10-CM

## 2017-03-05 DIAGNOSIS — K219 Gastro-esophageal reflux disease without esophagitis: Secondary | ICD-10-CM

## 2017-03-06 ENCOUNTER — Ambulatory Visit (INDEPENDENT_AMBULATORY_CARE_PROVIDER_SITE_OTHER): Payer: Medicare HMO | Admitting: Behavioral Health

## 2017-03-06 DIAGNOSIS — Z86711 Personal history of pulmonary embolism: Secondary | ICD-10-CM

## 2017-03-06 LAB — POCT INR: INR: 1.4

## 2017-03-06 NOTE — Progress Notes (Signed)
Nurse note reviewed. Agree with documention and plan. 

## 2017-03-06 NOTE — Patient Instructions (Signed)
Per Dr. Charlett Blake: Take Coumadin 5 mg daily, except on Monday & Friday take 2.5 mg. Return in 1 week for INR check.

## 2017-03-06 NOTE — Progress Notes (Signed)
Pre visit review using our clinic review tool, if applicable. No additional management support is needed unless otherwise documented below in the visit note.  Patient came in clinic for INR recheck. She reported adherence to medication & no positive findings. The patient's INR reading during today's visit was 1.4.  Per Dr. Charlett Blake: Take Coumadin 5 mg daily, except on Monday & Friday take 2.5 mg. Return in 1 week for INR check.  Patient was made aware of the provider's instructions & voiced understanding. Next appointment scheduled for 03/13/17.

## 2017-03-13 ENCOUNTER — Ambulatory Visit (INDEPENDENT_AMBULATORY_CARE_PROVIDER_SITE_OTHER): Payer: Medicare HMO | Admitting: Behavioral Health

## 2017-03-13 DIAGNOSIS — Z86711 Personal history of pulmonary embolism: Secondary | ICD-10-CM

## 2017-03-13 LAB — POCT INR: INR: 2

## 2017-03-13 NOTE — Patient Instructions (Signed)
Per Dr. Charlett Blake: Take Coumadin 5 mg daily, except on Monday & Friday take 2.5 mg. Return in 3-4 weeks for INR check.

## 2017-03-13 NOTE — Progress Notes (Signed)
RN INR check note reviewed. Agree with documention and plan.

## 2017-03-13 NOTE — Progress Notes (Signed)
Pre visit review using our clinic review tool, if applicable. No additional management support is needed unless otherwise documented below in the visit note.  Patient came in clinic for INR recheck. She voices adherence to medication & regimen. Patient reported no new changes since the last visit. Today's INR reading was 2.0.  Per Dr. Charlett Blake: Take Coumadin 5 mg daily, except on Monday & Friday take 2.5 mg. Return in 3-4 weeks for INR check.  Patient was made aware of the provider's recommendations & verbalized understanding. Next appointment 04/10/17 at 2:00 PM.

## 2017-03-15 DIAGNOSIS — I10 Essential (primary) hypertension: Secondary | ICD-10-CM | POA: Diagnosis not present

## 2017-03-15 DIAGNOSIS — N2581 Secondary hyperparathyroidism of renal origin: Secondary | ICD-10-CM | POA: Diagnosis not present

## 2017-03-15 DIAGNOSIS — E785 Hyperlipidemia, unspecified: Secondary | ICD-10-CM | POA: Diagnosis not present

## 2017-03-15 DIAGNOSIS — N179 Acute kidney failure, unspecified: Secondary | ICD-10-CM | POA: Diagnosis not present

## 2017-03-15 DIAGNOSIS — N183 Chronic kidney disease, stage 3 (moderate): Secondary | ICD-10-CM | POA: Diagnosis not present

## 2017-03-15 DIAGNOSIS — I509 Heart failure, unspecified: Secondary | ICD-10-CM | POA: Diagnosis not present

## 2017-03-15 DIAGNOSIS — I2699 Other pulmonary embolism without acute cor pulmonale: Secondary | ICD-10-CM | POA: Diagnosis not present

## 2017-03-15 DIAGNOSIS — D631 Anemia in chronic kidney disease: Secondary | ICD-10-CM | POA: Diagnosis not present

## 2017-03-15 DIAGNOSIS — E669 Obesity, unspecified: Secondary | ICD-10-CM | POA: Diagnosis not present

## 2017-03-15 NOTE — Progress Notes (Signed)
Emily Mitchell was seen today in the movement disorders clinic for neurologic consultation at the request of Mosie Lukes, MD.  The consultation is for the evaluation of tremor.  Pt states that tremor started 10 years ago and it started in the R hand but seems that it is in the L hand now as well as in the mouth.  It is in the legs as well.  She states that she notes it most when she uses the limbs and it is worse when she is upset.  She has to use a straw with drinking.  She has trouble eating soup on a spoon and sometimes has to drink the soup.  Sometimes she has to eat peas with her fingers.  Her maternal grandma had tremor all her life.  Saw Dr. Erling Cruz years ago and dx with "tremor" per pt (no notes available) and no medication given.  Pt worried she has PD.  02/04/14 update:  Pt is following up today.  She is doing about the same.  She feels that tremor is actually doing well today.  She does not want any medication for it.  She does complain about some neck pain, but it is not disabling.  She has had no falls.  No urinary incontinence.  No loss of bowel control.  She thought she was able to discontinue her Coumadin this month, but that has changed.  She does have a history of pulmonary embolism.  02/16/15 update:  The patient is following up in regards to her tremor.  This patient is accompanied in the office by her granddaughter who supplements the history.  The patient feels that her tremor has been stable and even thinks that she is doing better with time;  She doesn't drink coffee but goes through a 2L of diet dr pepper every few days.  She denies any new medical problems.  She was dx with macular degeneration since last visit and is getting a shot in her eye (right).  03/16/16 update:  The patient is following up today regarding her essential tremor.  I have reviewed prior records made available to me.   She is on no treatment for essential tremor.  She is no longer on metoprolol.  She  accidentally discontinued that and did fine with her blood pressure, so was never restarted.  Tremor fortunately has been well controlled.   Still drinking several diet doctor pepper per day.  She saw Dr. Charlett Blake just recently and her synthroid was dropped from 137 to 123mg and her valsartan was decreased b/c her BP had lowered.    03/16/17 update:  Patient seen today in follow-up for asymmetric essential tremor.  She is on no medication for this.  Reports that tremor is "doing better."  Drinks less diet dr pepper - about 4 per week.  Records since last visit were reviewed.  She was in the emergency room in July for shortness of breath, which resolved after IV fluids and albuterol.  Reports a few falls over the year for unknown reason; "the walls just walk out in front of me."  No injuries.  Not having to use her inhaler.  Uses klonopin only q hs.    ALLERGIES:   Allergies  Allergen Reactions  . Niaspan [Niacin Er] Nausea And Vomiting and Swelling    Swelling in mouth  . Pantoprazole     Mouth sores  . Bupropion Other (See Comments)    Uncontrollable shakes  . Penicillins Hives  .  Statins   . Sulfonamide Derivatives Hives  . Apple Rash  . Banana Rash  . Milk-Related Compounds Rash    CURRENT MEDICATIONS:  Current Outpatient Prescriptions on File Prior to Visit  Medication Sig Dispense Refill  . acetaminophen (TYLENOL) 500 MG tablet Take 500 mg by mouth every 6 (six) hours as needed.    Marland Kitchen albuterol (PROVENTIL HFA;VENTOLIN HFA) 108 (90 BASE) MCG/ACT inhaler Inhale 2 puffs into the lungs every 4 (four) hours as needed for wheezing.    . calcium citrate-vitamin D (CITRACAL+D) 315-200 MG-UNIT per tablet Take 1 tablet by mouth daily.     . cetirizine (ZYRTEC) 10 MG tablet Take 1 tablet (10 mg total) by mouth daily as needed for allergies. 30 tablet 11  . Cholecalciferol (D3 MAXIMUM STRENGTH) 5000 UNITS capsule Take 5,000 Units by mouth daily.    . Choline Fenofibrate (FENOFIBRIC ACID) 135 MG  CPDR TAKE 1 CAPSULE BY MOUTH ONCE DAILY 90 capsule 1  . citalopram (CELEXA) 20 MG tablet Take 2 tablets (40 mg total) by mouth daily. 180 tablet 1  . clonazePAM (KLONOPIN) 1 MG tablet TAKE ONE TABLET BY MOUTH THREE TIMES DAILY AS NEEDED FOR ANXIETY 90 tablet 2  . Cyanocobalamin (VITAMIN B-12 PO) Take by mouth.    . esomeprazole (NEXIUM) 40 MG capsule TAKE 1 CAPSULE BY MOUTH ONCE DAILY AT  12  NOON 90 capsule 1  . ezetimibe (ZETIA) 10 MG tablet TAKE 1 TABLET BY MOUTH ONCE DAILY 90 tablet 1  . FISH OIL-KRILL OIL PO Take by mouth.    . furosemide (LASIX) 20 MG tablet Take 1 tablet (20 mg total) by mouth daily as needed for edema. 30 tablet 3  . levalbuterol (XOPENEX) 1.25 MG/0.5ML nebulizer solution Take 1.25 mg by nebulization every 4 (four) hours as needed for wheezing. 30 each 0  . levothyroxine (SYNTHROID, LEVOTHROID) 125 MCG tablet TAKE 1 TABLET BY MOUTH ONCE DAILY BEFORE BREAKFAST 90 tablet 1  . losartan (COZAAR) 25 MG tablet Take 1 tablet (25 mg total) by mouth daily. 90 tablet 1  . metroNIDAZOLE (FLAGYL) 500 MG tablet Take 1 tablet (500 mg total) by mouth 4 (four) times daily. 56 tablet 0  . montelukast (SINGULAIR) 10 MG tablet Take 1 tablet (10 mg total) by mouth at bedtime as needed. 65 tablet 1  . nitroGLYCERIN (NITROSTAT) 0.4 MG SL tablet Place 1 tablet (0.4 mg total) under the tongue every 5 (five) minutes as needed for chest pain. 25 tablet 3  . omeprazole (PRILOSEC) 20 MG capsule Take 1 capsule (20 mg total) by mouth 2 (two) times daily before a meal. 28 capsule 0  . PT/INR Test STRP Check INR every 4 weeks,  PRN Dz:I26.99 48 each 0  . PT/INR Testing Monitor KIT Check INR every 4 weeks prn Dz: l26.99 1 each 0  . ranitidine (ZANTAC) 300 MG tablet TAKE 1 TABLET BY MOUTH ONCE DAILY WITH BREAKFAST 90 tablet 1  . tetracycline (ACHROMYCIN,SUMYCIN) 500 MG capsule Take 1 capsule (500 mg total) by mouth 4 (four) times daily. 56 capsule 0  . traMADol (ULTRAM) 50 MG tablet Take 1 tablet (50 mg  total) by mouth every 8 (eight) hours as needed for moderate pain or severe pain. 90 tablet 0  . valsartan (DIOVAN) 80 MG tablet Take 0.5 tablets (40 mg total) by mouth daily. 90 tablet 1  . warfarin (COUMADIN) 5 MG tablet TAKE AS DIRECTED 90 tablet 1   No current facility-administered medications on file prior to visit.  PAST MEDICAL HISTORY:   Past Medical History:  Diagnosis Date  . Anemia 07/05/2014  . Anxiety and depression 12/14/2008   Qualifier: Diagnosis of  By: [name] LPN, Almyra Free    . Arthritis   . Asthma   . Atypical chest pain 05/23/2015  . Carotid artery occlusion   . CHF (congestive heart failure) ([place])   . Chicken pox as a child  . Chronic kidney disease    Bright's Disease at age 69   . COPD (chronic obstructive pulmonary disease) ([place])   . GERD (gastroesophageal reflux disease)   . H. pylori infection 10/19/2013  . Hyperglycemia 03/14/2016  . Hyperlipidemia   . Hypertension   . Hypothyroidism   . Knee pain, bilateral 12/17/2012  . Left hip pain 05/23/2015  . Low back pain 09/19/2015  . Macular degeneration of both eyes 12/16/2015   Right eye is wet Left Eye is dry Shots every 11 to 12 weeks in right eye at opthamologist office  . Medicare annual wellness visit, subsequent 02/21/2015  . Mumps 71 yrs old  . Preventative health care 03/14/2016  . Pulmonary emboli ([place]) 05/26/2013  . Tremor   . UTI (lower urinary tract infection) 10/19/2013    PAST SURGICAL HISTORY:   Past Surgical History:  Procedure Laterality Date  . ABDOMINAL HYSTERECTOMY  1984  . CAROTID ENDARTERECTOMY Right 05/10/07   cea  . CATARACT EXTRACTION Left   . EYE SURGERY     b/l  . knees replaced  2005 and 2011   both knees  . WISDOM TOOTH EXTRACTION      SOCIAL HISTORY:   Social History   Social History  . Marital status: Divorced    Spouse name: N/A  . Number of children: 2  . Years of education: N/A   Occupational History  . Not on file.   Social History Main Topics  .  Smoking status: Former Smoker    Packs/day: 2.00    Years: 30.00    Types: Cigarettes    Quit date: 12/03/1994  . Smokeless tobacco: Never Used  . Alcohol use Yes     Comment: rarely, 1/month  . Drug use: No  . Sexual activity: No     Comment: lives with son and friend, no dietary restrictions   Other Topics Concern  . Not on file   Social History Narrative   epworth sleepiness scale = 11 (06/15/2015)    FAMILY HISTORY:   Family Status  Relation Status  . Mother Deceased at age 76       CHF, renal failure  . Sister Alive  . Father Deceased at age 29       unknown cause of death (burned)  . Daughter Alive       healthy  . Son Alive       healthy  . MGM Deceased at age 42  . MGF Deceased at age 67  . PGM Deceased  . PGF Deceased  . Sister Alive  . Sister Alive    ROS:  A complete 10 system review of systems was obtained and was unremarkable apart from what is mentioned above.  PHYSICAL EXAMINATION:    VITALS:   Vitals:   03/16/17 1241  BP: (!) 100/58  Pulse: 84  SpO2: 96%  Weight: 179 lb (81.2 kg)  Height: 5' 2.5" (1.588 m)   Wt Readings from Last 3 Encounters:  03/16/17 179 lb (81.2 kg)  02/05/17 178 lb 6.4 oz (80.9 kg)  01/16/17 183 lb  12.8 oz (83.4 kg)     GEN:  The patient appears stated age and is in NAD. HEENT:  Normocephalic, atraumatic.  The mucous membranes are moist. The superficial temporal arteries are without ropiness or tenderness.  Neurological examination:  Orientation: The patient is alert and oriented x3. Fund of knowledge is appropriate.  Recent and remote memory are intact.  Attention and concentration are normal.    Able to name objects and repeat phrases. Cranial nerves: There is good facial symmetry.  The speech is fluent and clear. Soft palate rises symmetrically and there is no tongue deviation. Hearing is intact to conversational tone. Sensation: Sensation is intact to light and pinprick throughout (facial, trunk, extremities).  Vibration is intact at the bilateral big toe. There is no extinction with double simultaneous stimulation. There is no sensory dermatomal level identified. Motor: Strength is 5/5 in the bilateral upper and lower extremities.   Shoulder shrug is equal and symmetric.  There is no pronator drift.   Movement examination: Tone: There is normal tone in the bilateral upper extremities.  The tone in the lower extremities is normal.  Abnormal movements: Chin tremor is present.   There is bilateral UE postural tremor (a little more symmetric than in the past).   Gait and Station: The patient has a slightly wide based gait.      ASSESSMENT/PLAN:  1.  Essential tremor.  -The pt has longstanding essential tremor that is somewhat asymmetric.  We did discuss that she could not have beta blockers because of her COPD and that we would have to cautiously use primidone because of her coumadin.  She was just relieved to hear that she did not have PD.    -has decreased her diet dr pepper  2.  F/u prn.  Much greater than 50% of this visit was spent in counseling and coordinating care.  Total face to face time:  15 min

## 2017-03-16 ENCOUNTER — Encounter: Payer: Self-pay | Admitting: Neurology

## 2017-03-16 ENCOUNTER — Ambulatory Visit (INDEPENDENT_AMBULATORY_CARE_PROVIDER_SITE_OTHER): Payer: Medicare HMO | Admitting: Neurology

## 2017-03-16 VITALS — BP 100/58 | HR 84 | Ht 62.5 in | Wt 179.0 lb

## 2017-03-16 DIAGNOSIS — G250 Essential tremor: Secondary | ICD-10-CM | POA: Diagnosis not present

## 2017-04-10 ENCOUNTER — Ambulatory Visit (INDEPENDENT_AMBULATORY_CARE_PROVIDER_SITE_OTHER): Payer: Medicare HMO | Admitting: Behavioral Health

## 2017-04-10 DIAGNOSIS — Z86711 Personal history of pulmonary embolism: Secondary | ICD-10-CM | POA: Diagnosis not present

## 2017-04-10 LAB — POCT INR: INR: 1.8

## 2017-04-10 NOTE — Patient Instructions (Signed)
Per Dr. Charlett Blake: Continue taking Coumadin 5 mg daily, except on Monday & Friday take 2.5 mg. Return in 3 weeks for INR check.

## 2017-04-10 NOTE — Progress Notes (Signed)
Pre visit review using our clinic review tool, if applicable. No additional management support is needed unless otherwise documented below in the visit note.  Patient presents in clinic for INR check. She voiced missing yesterday's (04/09/17) dose of Coumadin. No other positive findings or changes were reported. Today's INR reading was 1.8.  Per Dr. Charlett Blake: Continue taking Coumadin 5 mg daily, except on Monday & Friday take 2.5 mg. Return in 3 weeks for INR check.  Informed patient of the provider's recommendations. She verbalized understanding. Next appointment 05/03/17 at 2:00 PM.

## 2017-04-17 DIAGNOSIS — H353121 Nonexudative age-related macular degeneration, left eye, early dry stage: Secondary | ICD-10-CM | POA: Diagnosis not present

## 2017-04-17 DIAGNOSIS — H353211 Exudative age-related macular degeneration, right eye, with active choroidal neovascularization: Secondary | ICD-10-CM | POA: Diagnosis not present

## 2017-04-19 ENCOUNTER — Encounter: Payer: Self-pay | Admitting: Family Medicine

## 2017-04-19 ENCOUNTER — Ambulatory Visit (INDEPENDENT_AMBULATORY_CARE_PROVIDER_SITE_OTHER): Payer: Medicare HMO | Admitting: Family Medicine

## 2017-04-19 VITALS — BP 122/78 | HR 69 | Temp 98.0°F | Resp 18 | Wt 179.0 lb

## 2017-04-19 DIAGNOSIS — G8929 Other chronic pain: Secondary | ICD-10-CM | POA: Diagnosis not present

## 2017-04-19 DIAGNOSIS — M25562 Pain in left knee: Secondary | ICD-10-CM | POA: Diagnosis not present

## 2017-04-19 DIAGNOSIS — M25561 Pain in right knee: Secondary | ICD-10-CM | POA: Diagnosis not present

## 2017-04-19 DIAGNOSIS — R739 Hyperglycemia, unspecified: Secondary | ICD-10-CM | POA: Diagnosis not present

## 2017-04-19 DIAGNOSIS — M542 Cervicalgia: Secondary | ICD-10-CM | POA: Diagnosis not present

## 2017-04-19 DIAGNOSIS — E782 Mixed hyperlipidemia: Secondary | ICD-10-CM | POA: Diagnosis not present

## 2017-04-19 DIAGNOSIS — I10 Essential (primary) hypertension: Secondary | ICD-10-CM

## 2017-04-19 DIAGNOSIS — E039 Hypothyroidism, unspecified: Secondary | ICD-10-CM | POA: Diagnosis not present

## 2017-04-19 DIAGNOSIS — Z23 Encounter for immunization: Secondary | ICD-10-CM

## 2017-04-19 MED ORDER — GABAPENTIN 100 MG PO CAPS: 100.0000 mg | ORAL_CAPSULE | Freq: Every day | ORAL | 3 refills | Status: DC

## 2017-04-19 NOTE — Assessment & Plan Note (Signed)
Well controlled, no changes to meds. Encouraged heart healthy diet such as the DASH diet and exercise as tolerated.  °

## 2017-04-19 NOTE — Assessment & Plan Note (Signed)
On Levothyroxine, continue to monitor 

## 2017-04-19 NOTE — Assessment & Plan Note (Signed)
Encouraged heart healthy diet, increase exercise, avoid trans fats, consider a krill oil cap daily 

## 2017-04-19 NOTE — Patient Instructions (Addendum)
shingrix  New shot series of 2 shots to prevent shingles, check with [facility]   Encouraged increased hydration and fiber in diet. Daily probiotics. If bowels not moving can use MOM 2 tbls po in 4 oz of warm prune juice by mouth every 2-3 days. If no results then repeat in 4 hours with  Dulcolax suppository pr, may repeat again in 4 more hours as needed. Seek care if symptoms worsen. Consider daily Miralax and/or Dulcolax if symptoms persist.   Miralax and Benefiber and take them once or twice daily Hypertension Hypertension, commonly called high blood pressure, is when the force of blood pumping through the arteries is too strong. The arteries are the blood vessels that carry blood from the heart throughout the body. Hypertension forces the heart to work harder to pump blood and may cause arteries to become narrow or stiff. Having untreated or uncontrolled hypertension can cause heart attacks, strokes, kidney disease, and other problems. A blood pressure reading consists of a higher number over a lower number. Ideally, your blood pressure should be below 120/80. The first ("top") number is called the systolic pressure. It is a measure of the pressure in your arteries as your heart beats. The second ("bottom") number is called the diastolic pressure. It is a measure of the pressure in your arteries as the heart relaxes. What are the causes? The cause of this condition is not known. What increases the risk? Some risk factors for high blood pressure are under your control. Others are not. Factors you can change  Smoking.  Having type 2 diabetes mellitus, high cholesterol, or both.  Not getting enough exercise or physical activity.  Being overweight.  Having too much fat, sugar, calories, or salt (sodium) in your diet.  Drinking too much alcohol. Factors that are difficult or impossible to change  Having chronic kidney disease.  Having a family history of high blood pressure.  Age. Risk  increases with age.  Race. You may be at higher risk if you are African-American.  Gender. Men are at higher risk than women before age 43. After age 1, women are at higher risk than men.  Having obstructive sleep apnea.  Stress. What are the signs or symptoms? Extremely high blood pressure (hypertensive crisis) may cause:  Headache.  Anxiety.  Shortness of breath.  Nosebleed.  Nausea and vomiting.  Severe chest pain.  Jerky movements you cannot control (seizures).  How is this diagnosed? This condition is diagnosed by measuring your blood pressure while you are seated, with your arm resting on a surface. The cuff of the blood pressure monitor will be placed directly against the skin of your upper arm at the level of your heart. It should be measured at least twice using the same arm. Certain conditions can cause a difference in blood pressure between your right and left arms. Certain factors can cause blood pressure readings to be lower or higher than normal (elevated) for a short period of time:  When your blood pressure is higher when you are in a health care provider's office than when you are at home, this is called white coat hypertension. Most people with this condition do not need medicines.  When your blood pressure is higher at home than when you are in a health care provider's office, this is called masked hypertension. Most people with this condition may need medicines to control blood pressure.  If you have a high blood pressure reading during one visit or you have normal blood  pressure with other risk factors:  You may be asked to return on a different day to have your blood pressure checked again.  You may be asked to monitor your blood pressure at home for 1 week or longer.  If you are diagnosed with hypertension, you may have other blood or imaging tests to help your health care provider understand your overall risk for other conditions. How is this  treated? This condition is treated by making healthy lifestyle changes, such as eating healthy foods, exercising more, and reducing your alcohol intake. Your health care provider may prescribe medicine if lifestyle changes are not enough to get your blood pressure under control, and if:  Your systolic blood pressure is above 130.  Your diastolic blood pressure is above 80.  Your personal target blood pressure may vary depending on your medical conditions, your age, and other factors. Follow these instructions at home: Eating and drinking  Eat a diet that is high in fiber and potassium, and low in sodium, added sugar, and fat. An example eating plan is called the DASH (Dietary Approaches to Stop Hypertension) diet. To eat this way: ? Eat plenty of fresh fruits and vegetables. Try to fill half of your plate at each meal with fruits and vegetables. ? Eat whole grains, such as whole wheat pasta, brown rice, or whole grain bread. Fill about one quarter of your plate with whole grains. ? Eat or drink low-fat dairy products, such as skim milk or low-fat yogurt. ? Avoid fatty cuts of meat, processed or cured meats, and poultry with skin. Fill about one quarter of your plate with lean proteins, such as fish, chicken without skin, beans, eggs, and tofu. ? Avoid premade and processed foods. These tend to be higher in sodium, added sugar, and fat.  Reduce your daily sodium intake. Most people with hypertension should eat less than 1,500 mg of sodium a day.  Limit alcohol intake to no more than 1 drink a day for nonpregnant women and 2 drinks a day for men. One drink equals 12 oz of beer, 5 oz of wine, or 1 oz of hard liquor. Lifestyle  Work with your health care provider to maintain a healthy body weight or to lose weight. Ask what an ideal weight is for you.  Get at least 30 minutes of exercise that causes your heart to beat faster (aerobic exercise) most days of the week. Activities may include  walking, swimming, or biking.  Include exercise to strengthen your muscles (resistance exercise), such as pilates or lifting weights, as part of your weekly exercise routine. Try to do these types of exercises for 30 minutes at least 3 days a week.  Do not use any products that contain nicotine or tobacco, such as cigarettes and e-cigarettes. If you need help quitting, ask your health care provider.  Monitor your blood pressure at home as told by your health care provider.  Keep all follow-up visits as told by your health care provider. This is important. Medicines  Take over-the-counter and prescription medicines only as told by your health care provider. Follow directions carefully. Blood pressure medicines must be taken as prescribed.  Do not skip doses of blood pressure medicine. Doing this puts you at risk for problems and can make the medicine less effective.  Ask your health care provider about side effects or reactions to medicines that you should watch for. Contact a health care provider if:  You think you are having a reaction to a medicine  you are taking.  You have headaches that keep coming back (recurring).  You feel dizzy.  You have swelling in your ankles.  You have trouble with your vision. Get help right away if:  You develop a severe headache or confusion.  You have unusual weakness or numbness.  You feel faint.  You have severe pain in your chest or abdomen.  You vomit repeatedly.  You have trouble breathing. Summary  Hypertension is when the force of blood pumping through your arteries is too strong. If this condition is not controlled, it may put you at risk for serious complications.  Your personal target blood pressure may vary depending on your medical conditions, your age, and other factors. For most people, a normal blood pressure is less than 120/80.  Hypertension is treated with lifestyle changes, medicines, or a combination of both. Lifestyle  changes include weight loss, eating a healthy, low-sodium diet, exercising more, and limiting alcohol. This information is not intended to replace advice given to you by your health care provider. Make sure you discuss any questions you have with your health care provider. Document Released: 07/17/2005 Document Revised: 06/14/2016 Document Reviewed: 06/14/2016 Elsevier Interactive Patient Education  [employer].

## 2017-04-19 NOTE — Assessment & Plan Note (Signed)
hgba1c acceptable, minimize simple carbs. Increase exercise as tolerated.  

## 2017-04-19 NOTE — Progress Notes (Signed)
Subjective:  I acted as a Education administrator for Dr. Charlett Blake. [name], [place]  Patient ID: Emily Mitchell, female    DOB: Aug 12, 1945, 71 y.o.   MRN: [id number]  No chief complaint on file.   HPI  Patient is in today for a 3 month follow up. Patient c/o the nerves in her head and neck give her problems.  No recent febrile illness or acute hospitalizations. Denies CP/palp/SOB/congestion/fevers/GI or GU c/o. Taking meds as prescribed. Neck stiffness is noted but no recent injury or falls. Is noting an increase in headaches with migrainous symptoms over the past couple of months. Notes nausea, photophobia, phonophobia. Has been getting 3-4 shots a year in her right eye due to macular degeneration. .   Patient Care Team: Mosie Lukes, MD as PCP - General (Family Medicine) Rigoberto Noel, MD as Consulting Physician (Pulmonary Disease) Donato Heinz, MD as Consulting Physician (Nephrology) Tat, Eustace Quail, DO as Consulting Physician (Neurology) Antionette Fairy Isaias Cowman, MD as Consulting Physician (Ophthalmology)   Past Medical History:  Diagnosis Date  . Anemia 07/05/2014  . Anxiety and depression 12/14/2008   Qualifier: Diagnosis of  By: [name] LPN, Almyra Free    . Arthritis   . Asthma   . Atypical chest pain 05/23/2015  . Carotid artery occlusion   . CHF (congestive heart failure) ([place])   . Chicken pox as a child  . Chronic kidney disease    Bright's Disease at age 76   . COPD (chronic obstructive pulmonary disease) ([place])   . GERD (gastroesophageal reflux disease)   . H. pylori infection 10/19/2013  . Hyperglycemia 03/14/2016  . Hyperlipidemia   . Hypertension   . Hypothyroidism   . Knee pain, bilateral 12/17/2012  . Left hip pain 05/23/2015  . Low back pain 09/19/2015  . Macular degeneration of both eyes 12/16/2015   Right eye is wet Left Eye is dry Shots every 11 to 12 weeks in right eye at opthamologist office  . Medicare annual wellness visit, subsequent 02/21/2015  . Mumps 71 yrs old  .  Preventative health care 03/14/2016  . Pulmonary emboli ([place]) 05/26/2013  . Tremor   . UTI (lower urinary tract infection) 10/19/2013    Past Surgical History:  Procedure Laterality Date  . ABDOMINAL HYSTERECTOMY  1984  . CAROTID ENDARTERECTOMY Right 05/10/07   cea  . CATARACT EXTRACTION Left   . EYE SURGERY     b/l  . knees replaced  2005 and 2011   both knees  . WISDOM TOOTH EXTRACTION      Family History  Problem Relation Age of Onset  . Stroke Mother        mini stroke  . Kidney disease Mother   . Heart failure Mother   . Hypertension Mother   . Diabetes Sister        type 2  . Kidney disease Sister   . Heart attack Maternal Grandfather   . Hyperlipidemia Sister     Social History   Social History  . Marital status: Divorced    Spouse name: N/A  . Number of children: 2  . Years of education: N/A   Occupational History  . Not on file.   Social History Main Topics  . Smoking status: Former Smoker    Packs/day: 2.00    Years: 30.00    Types: Cigarettes    Quit date: 12/03/1994  . Smokeless tobacco: Never Used  . Alcohol use Yes     Comment: rarely, 1/month  .  Drug use: No  . Sexual activity: No     Comment: lives with son and friend, no dietary restrictions   Other Topics Concern  . Not on file   Social History Narrative   epworth sleepiness scale = 11 (06/15/2015)    Outpatient Medications Prior to Visit  Medication Sig Dispense Refill  . acetaminophen (TYLENOL) 500 MG tablet Take 500 mg by mouth every 6 (six) hours as needed.    Marland Kitchen albuterol (PROVENTIL HFA;VENTOLIN HFA) 108 (90 BASE) MCG/ACT inhaler Inhale 2 puffs into the lungs every 4 (four) hours as needed for wheezing.    . calcium citrate-vitamin D (CITRACAL+D) 315-200 MG-UNIT per tablet Take 1 tablet by mouth daily.     . cetirizine (ZYRTEC) 10 MG tablet Take 1 tablet (10 mg total) by mouth daily as needed for allergies. 30 tablet 11  . Cholecalciferol (D3 MAXIMUM STRENGTH) 5000 UNITS capsule  Take 5,000 Units by mouth daily.    . Choline Fenofibrate (FENOFIBRIC ACID) 135 MG CPDR TAKE 1 CAPSULE BY MOUTH ONCE DAILY 90 capsule 1  . citalopram (CELEXA) 20 MG tablet Take 2 tablets (40 mg total) by mouth daily. 180 tablet 1  . clonazePAM (KLONOPIN) 1 MG tablet TAKE ONE TABLET BY MOUTH THREE TIMES DAILY AS NEEDED FOR ANXIETY 90 tablet 2  . Cyanocobalamin (VITAMIN B-12 PO) Take by mouth.    . esomeprazole (NEXIUM) 40 MG capsule TAKE 1 CAPSULE BY MOUTH ONCE DAILY AT  12  NOON 90 capsule 1  . ezetimibe (ZETIA) 10 MG tablet TAKE 1 TABLET BY MOUTH ONCE DAILY 90 tablet 1  . FISH OIL-KRILL OIL PO Take by mouth.    . furosemide (LASIX) 20 MG tablet Take 1 tablet (20 mg total) by mouth daily as needed for edema. 30 tablet 3  . levalbuterol (XOPENEX) 1.25 MG/0.5ML nebulizer solution Take 1.25 mg by nebulization every 4 (four) hours as needed for wheezing. 30 each 0  . levothyroxine (SYNTHROID, LEVOTHROID) 125 MCG tablet TAKE 1 TABLET BY MOUTH ONCE DAILY BEFORE BREAKFAST 90 tablet 1  . losartan (COZAAR) 25 MG tablet Take 1 tablet (25 mg total) by mouth daily. 90 tablet 1  . montelukast (SINGULAIR) 10 MG tablet Take 1 tablet (10 mg total) by mouth at bedtime as needed. 65 tablet 1  . nitroGLYCERIN (NITROSTAT) 0.4 MG SL tablet Place 1 tablet (0.4 mg total) under the tongue every 5 (five) minutes as needed for chest pain. 25 tablet 3  . omeprazole (PRILOSEC) 20 MG capsule Take 1 capsule (20 mg total) by mouth 2 (two) times daily before a meal. 28 capsule 0  . PT/INR Test STRP Check INR every 4 weeks,  PRN Dz:I26.99 48 each 0  . PT/INR Testing Monitor KIT Check INR every 4 weeks prn Dz: l26.99 1 each 0  . ranitidine (ZANTAC) 300 MG tablet TAKE 1 TABLET BY MOUTH ONCE DAILY WITH BREAKFAST 90 tablet 1  . traMADol (ULTRAM) 50 MG tablet Take 1 tablet (50 mg total) by mouth every 8 (eight) hours as needed for moderate pain or severe pain. 90 tablet 0  . valsartan (DIOVAN) 80 MG tablet Take 0.5 tablets (40 mg  total) by mouth daily. 90 tablet 1  . warfarin (COUMADIN) 5 MG tablet TAKE AS DIRECTED 90 tablet 1  . metroNIDAZOLE (FLAGYL) 500 MG tablet Take 1 tablet (500 mg total) by mouth 4 (four) times daily. 56 tablet 0  . tetracycline (ACHROMYCIN,SUMYCIN) 500 MG capsule Take 1 capsule (500 mg total) by mouth 4 (  four) times daily. 56 capsule 0   No facility-administered medications prior to visit.     Allergies  Allergen Reactions  . Niaspan [Niacin Er] Nausea And Vomiting and Swelling    Swelling in mouth  . Pantoprazole     Mouth sores  . Bupropion Other (See Comments)    Uncontrollable shakes  . Penicillins Hives  . Statins   . Sulfonamide Derivatives Hives  . Apple Rash  . Banana Rash  . Milk-Related Compounds Rash    Review of Systems  Constitutional: Negative for fever and malaise/fatigue.  HENT: Negative for congestion.   Eyes: Negative for blurred vision.  Respiratory: Negative for cough and shortness of breath.   Cardiovascular: Negative for chest pain, palpitations and leg swelling.  Gastrointestinal: Negative for vomiting.  Musculoskeletal: Positive for joint pain and neck pain. Negative for back pain.  Skin: Negative for rash.  Neurological: Negative for loss of consciousness and headaches.       Objective:    Physical Exam  Constitutional: She is oriented to person, place, and time. She appears well-developed and well-nourished. No distress.  HENT:  Head: Normocephalic and atraumatic.  Eyes: Conjunctivae are normal.  Neck: Normal range of motion. No thyromegaly present.  Cardiovascular: Normal rate and regular rhythm.   Pulmonary/Chest: Effort normal and breath sounds normal. She has no wheezes.  Abdominal: Soft. Bowel sounds are normal. There is no tenderness.  Musculoskeletal: Normal range of motion. She exhibits no edema or deformity.  Neurological: She is alert and oriented to person, place, and time.  Skin: Skin is warm and dry. She is not diaphoretic.    Psychiatric: She has a normal mood and affect.    BP 122/78 (BP Location: Left Arm, Patient Position: Sitting, Cuff Size: Normal)   Pulse 69   Temp 98 F (36.7 C) (Oral)   Resp 18   Wt 179 lb (81.2 kg)   SpO2 98%   BMI 32.22 kg/m  Wt Readings from Last 3 Encounters:  04/19/17 179 lb (81.2 kg)  03/16/17 179 lb (81.2 kg)  02/05/17 178 lb 6.4 oz (80.9 kg)   BP Readings from Last 3 Encounters:  04/19/17 122/78  03/16/17 (!) 100/58  02/05/17 124/71     Immunization History  Administered Date(s) Administered  . Influenza Split 04/29/2013  . Influenza, High Dose Seasonal PF 03/30/2016, 04/19/2017  . Influenza,inj,Quad PF,6+ Mos 03/26/2014, 05/17/2015  . Pneumococcal Conjugate-13 04/29/2013  . Pneumococcal Polysaccharide-23 04/30/2014  . Tdap 05/26/2013  . Zoster 07/31/2012    Health Maintenance  Topic Date Due  . MAMMOGRAM  08/15/2018  . COLONOSCOPY  09/25/2018  . TETANUS/TDAP  05/27/2023  . INFLUENZA VACCINE  Completed  . DEXA SCAN  Completed  . Hepatitis C Screening  Completed  . PNA vac Low Risk Adult  Completed    Lab Results  Component Value Date   WBC 6.8 04/19/2017   HGB 12.4 04/19/2017   HCT 38.3 04/19/2017   PLT 237.0 04/19/2017   GLUCOSE 76 04/19/2017   CHOL 177 04/19/2017   TRIG 243.0 (H) 04/19/2017   HDL 48.20 04/19/2017   LDLDIRECT 96.0 04/19/2017   LDLCALC 101 (H) 09/14/2016   ALT 16 04/19/2017   AST 20 04/19/2017   NA 140 04/19/2017   K 5.0 04/19/2017   CL 106 04/19/2017   CREATININE 1.92 (H) 04/19/2017   BUN 30 (H) 04/19/2017   CO2 29 04/19/2017   TSH 0.63 04/19/2017   INR 1.8 04/10/2017   HGBA1C 5.2 04/19/2017  Lab Results  Component Value Date   TSH 0.63 04/19/2017   Lab Results  Component Value Date   WBC 6.8 04/19/2017   HGB 12.4 04/19/2017   HCT 38.3 04/19/2017   MCV 92.5 04/19/2017   PLT 237.0 04/19/2017   Lab Results  Component Value Date   NA 140 04/19/2017   K 5.0 04/19/2017   CO2 29 04/19/2017   GLUCOSE 76  04/19/2017   BUN 30 (H) 04/19/2017   CREATININE 1.92 (H) 04/19/2017   BILITOT 0.4 04/19/2017   ALKPHOS 35 (L) 04/19/2017   AST 20 04/19/2017   ALT 16 04/19/2017   PROT 6.6 04/19/2017   ALBUMIN 4.0 04/19/2017   CALCIUM 9.6 04/19/2017   ANIONGAP 9 02/03/2017   GFR 27.51 (L) 04/19/2017   Lab Results  Component Value Date   CHOL 177 04/19/2017   Lab Results  Component Value Date   HDL 48.20 04/19/2017   Lab Results  Component Value Date   LDLCALC 101 (H) 09/14/2016   Lab Results  Component Value Date   TRIG 243.0 (H) 04/19/2017   Lab Results  Component Value Date   CHOLHDL 4 04/19/2017   Lab Results  Component Value Date   HGBA1C 5.2 04/19/2017         Assessment & Plan:   Problem List Items Addressed This Visit    Knee pain, bilateral   Relevant Orders   Ambulatory referral to Sports Medicine   Hypothyroidism    On Levothyroxine, continue to monitor      Relevant Orders   TSH (Completed)   Hyperlipidemia, mixed    Encouraged heart healthy diet, increase exercise, avoid trans fats, consider a krill oil cap daily      Relevant Orders   Lipid panel (Completed)   Essential hypertension    Well controlled, no changes to meds. Encouraged heart healthy diet such as the DASH diet and exercise as tolerated.       Relevant Orders   CBC (Completed)   Comprehensive metabolic panel (Completed)   Hyperglycemia    hgba1c acceptable, minimize simple carbs. Increase exercise as tolerated.       Relevant Orders   Hemoglobin A1c (Completed)   Neck pain    Encouraged moist heat and gentle stretching as tolerated. May try NSAIDs and prescription meds as directed and report if symptoms worsen or seek immediate care. Try topical lidocaine and referred to sports med for further consideration      Relevant Orders   Ambulatory referral to Sports Medicine    Other Visit Diagnoses    Needs flu shot    -  Primary   Relevant Orders   Flu vaccine HIGH DOSE PF (Fluzone  High dose) (Completed)      I have discontinued Ms. Tukes metroNIDAZOLE and tetracycline. I am also having her start on gabapentin. Additionally, I am having her maintain her calcium citrate-vitamin D, Cholecalciferol, albuterol, nitroGLYCERIN, Cyanocobalamin (VITAMIN B-12 PO), acetaminophen, furosemide, citalopram, valsartan, clonazePAM, warfarin, FISH OIL-KRILL OIL PO, traMADol, montelukast, cetirizine, omeprazole, levalbuterol, PT/INR Testing Monitor, PT/INR Test, losartan, Fenofibric Acid, ranitidine, ezetimibe, esomeprazole, and levothyroxine.  Meds ordered this encounter  Medications  . gabapentin (NEURONTIN) 100 MG capsule    Sig: Take 1-2 capsules (100-200 mg total) by mouth at bedtime.    Dispense:  60 capsule    Refill:  3    CMA served as scribe during this visit. History, Physical and Plan performed by medical provider. Documentation and orders reviewed and attested to.  Penni Homans, MD

## 2017-04-20 LAB — COMPREHENSIVE METABOLIC PANEL
ALBUMIN: 4 g/dL (ref 3.5–5.2)
ALT: 16 U/L (ref 0–35)
AST: 20 U/L (ref 0–37)
Alkaline Phosphatase: 35 U/L — ABNORMAL LOW (ref 39–117)
BILIRUBIN TOTAL: 0.4 mg/dL (ref 0.2–1.2)
BUN: 30 mg/dL — ABNORMAL HIGH (ref 6–23)
CALCIUM: 9.6 mg/dL (ref 8.4–10.5)
CHLORIDE: 106 meq/L (ref 96–112)
CO2: 29 mEq/L (ref 19–32)
CREATININE: 1.92 mg/dL — AB (ref 0.40–1.20)
GFR: 27.51 mL/min — AB (ref >60.00)
Glucose, Bld: 76 mg/dL (ref 70–99)
Potassium: 5 mEq/L (ref 3.5–5.1)
Sodium: 140 mEq/L (ref 135–145)
Total Protein: 6.6 g/dL (ref 6.0–8.3)

## 2017-04-20 LAB — CBC
HCT: 38.3 % (ref 36.0–46.0)
HEMOGLOBIN: 12.4 g/dL (ref 12.0–15.0)
MCHC: 32.5 g/dL (ref 30.0–36.0)
MCV: 92.5 fl (ref 78.0–100.0)
PLATELETS: 237 10*3/uL (ref 150.0–400.0)
RBC: 4.14 Mil/uL (ref 3.87–5.11)
RDW: 13.4 % (ref 11.5–15.5)
WBC: 6.8 10*3/uL (ref 4.0–10.5)

## 2017-04-20 LAB — LDL CHOLESTEROL, DIRECT: Direct LDL: 96 mg/dL

## 2017-04-20 LAB — LIPID PANEL
CHOLESTEROL: 177 mg/dL (ref 0–200)
HDL: 48.2 mg/dL (ref >39.00)
NonHDL: 128.72
TRIGLYCERIDES: 243 mg/dL — AB (ref 0.0–149.0)
Total CHOL/HDL Ratio: 4
VLDL: 48.6 mg/dL — ABNORMAL HIGH (ref 0.0–40.0)

## 2017-04-20 LAB — TSH: TSH: 0.63 u[IU]/mL (ref 0.35–4.50)

## 2017-04-20 LAB — HEMOGLOBIN A1C: Hgb A1c MFr Bld: 5.2 % (ref 4.6–6.5)

## 2017-04-22 DIAGNOSIS — M542 Cervicalgia: Secondary | ICD-10-CM | POA: Insufficient documentation

## 2017-04-22 HISTORY — DX: Cervicalgia: M54.2

## 2017-04-22 NOTE — Assessment & Plan Note (Signed)
No recent exacerbation. No changes 

## 2017-04-22 NOTE — Assessment & Plan Note (Signed)
Encouraged moist heat and gentle stretching as tolerated. May try NSAIDs and prescription meds as directed and report if symptoms worsen or seek immediate care. Try topical lidocaine and referred to sports med for further consideration

## 2017-04-23 DIAGNOSIS — N183 Chronic kidney disease, stage 3 (moderate): Secondary | ICD-10-CM | POA: Diagnosis not present

## 2017-04-23 DIAGNOSIS — N189 Chronic kidney disease, unspecified: Secondary | ICD-10-CM | POA: Diagnosis not present

## 2017-05-03 ENCOUNTER — Ambulatory Visit (INDEPENDENT_AMBULATORY_CARE_PROVIDER_SITE_OTHER): Payer: Medicare HMO | Admitting: Behavioral Health

## 2017-05-03 ENCOUNTER — Ambulatory Visit: Payer: Medicare HMO | Admitting: Family Medicine

## 2017-05-03 DIAGNOSIS — Z7901 Long term (current) use of anticoagulants: Secondary | ICD-10-CM

## 2017-05-03 LAB — POCT INR: INR: 2.6

## 2017-05-03 NOTE — Progress Notes (Signed)
Pre visit review using our clinic review tool, if applicable. No additional management support is needed unless otherwise documented below in the visit note.  Patient presents in office for INR check. She voiced adherence to medication & regimen. All findings were negative. Today's INR reading was 2.6.  Per Dr. Charlett Blake: Continue taking Coumadin 5 mg daily, except on Monday & Friday take 2.5 mg. Return in 4 weeks for INR check.  Informed patient of the provider's recommendations. She verbalized understanding. Next appointment scheduled for 05/31/17 at 2:30 PM.

## 2017-05-03 NOTE — Patient Instructions (Signed)
Per Dr. Charlett Blake: Continue taking Coumadin 5 mg daily, except on Monday & Friday take 2.5 mg. Return in 4 weeks for INR check.

## 2017-05-04 ENCOUNTER — Encounter: Payer: Self-pay | Admitting: Family Medicine

## 2017-05-07 ENCOUNTER — Other Ambulatory Visit: Payer: Self-pay | Admitting: Family Medicine

## 2017-05-08 ENCOUNTER — Other Ambulatory Visit: Payer: Self-pay | Admitting: Emergency Medicine

## 2017-05-08 MED ORDER — CITALOPRAM HYDROBROMIDE 20 MG PO TABS: 40.0000 mg | ORAL_TABLET | Freq: Every day | ORAL | 1 refills | Status: DC

## 2017-05-23 ENCOUNTER — Other Ambulatory Visit: Payer: Self-pay | Admitting: Family Medicine

## 2017-05-23 NOTE — Telephone Encounter (Signed)
OK to fill once UDS updated

## 2017-05-23 NOTE — Telephone Encounter (Signed)
Requesting:Klonopin Contract:yes UDS:low risk nxt scrn 9/291/8 Last OV:04/19/17 Next OV:07/19/17 Last Refill:10/26/16  #[id number]   Please advise

## 2017-05-24 ENCOUNTER — Other Ambulatory Visit: Payer: Medicare HMO

## 2017-05-24 ENCOUNTER — Other Ambulatory Visit: Payer: Self-pay

## 2017-05-24 DIAGNOSIS — Z79899 Other long term (current) drug therapy: Secondary | ICD-10-CM

## 2017-05-24 NOTE — Telephone Encounter (Signed)
Patient notified of rx in the front and the UDS

## 2017-05-29 LAB — PAIN MGMT, PROFILE 8 W/CONF, U
6 ACETYLMORPHINE: NEGATIVE ng/mL (ref <10)
ALPHAHYDROXYTRIAZOLAM: NEGATIVE ng/mL (ref <50)
AMINOCLONAZEPAM: 518 ng/mL — AB (ref <25)
Alcohol Metabolites: NEGATIVE ng/mL (ref <500)
Alphahydroxyalprazolam: NEGATIVE ng/mL (ref <25)
Alphahydroxymidazolam: NEGATIVE ng/mL (ref <50)
Amphetamines: NEGATIVE ng/mL (ref <500)
BUPRENORPHINE, URINE: NEGATIVE ng/mL (ref <5)
Benzodiazepines: POSITIVE ng/mL — AB (ref <100)
CREATININE: 155.3 mg/dL
Cocaine Metabolite: NEGATIVE ng/mL (ref <150)
HYDROXYETHYLFLURAZEPAM: NEGATIVE ng/mL (ref <50)
Lorazepam: NEGATIVE ng/mL (ref <50)
MDA: NEGATIVE ng/mL (ref <200)
MDMA: NEGATIVE ng/mL (ref <200)
MDMA: NEGATIVE ng/mL (ref <500)
Marijuana Metabolite: NEGATIVE ng/mL (ref <20)
NORDIAZEPAM: NEGATIVE ng/mL (ref <50)
OPIATES: NEGATIVE ng/mL (ref <100)
OXAZEPAM: NEGATIVE ng/mL (ref <50)
OXIDANT: NEGATIVE ug/mL (ref <200)
OXYCODONE: NEGATIVE ng/mL (ref <100)
PH: 6.29 (ref 4.5–9.0)
TEMAZEPAM: NEGATIVE ng/mL (ref <50)

## 2017-05-31 ENCOUNTER — Ambulatory Visit (INDEPENDENT_AMBULATORY_CARE_PROVIDER_SITE_OTHER): Payer: Medicare HMO

## 2017-05-31 DIAGNOSIS — Z7901 Long term (current) use of anticoagulants: Secondary | ICD-10-CM

## 2017-05-31 LAB — POCT INR: INR: 3

## 2017-05-31 NOTE — Progress Notes (Signed)
Pre visit review using our clinic tool,if applicable. No additional management support is needed unless otherwise documented below in the visit note.  .  Patient in for INR check per order from Dr. Frederik Pear. Patient has current diagnosis of Pulmonary Emboli.  Patient has voiced  Complaints this visit. She denies any missed doses, bleeding or bruising.  Currently taking Coumadin 5 mg daily except for Monday and Friday she takes 2.5 mg. Last INR = 2.6  INR today = 3.0 Goal = 2.0-3.0  Per Dr. Nani Ravens patient to continue taking medication as ordered and return for INR re-check in 4 weeks. Return appointment scheduled for patient

## 2017-05-31 NOTE — Progress Notes (Signed)
Noted. Agree with above.  [place], DO 05/31/17 4:43 PM

## 2017-06-12 ENCOUNTER — Ambulatory Visit: Payer: Medicare HMO | Admitting: *Deleted

## 2017-06-25 DIAGNOSIS — N183 Chronic kidney disease, stage 3 (moderate): Secondary | ICD-10-CM | POA: Diagnosis not present

## 2017-06-25 DIAGNOSIS — N189 Chronic kidney disease, unspecified: Secondary | ICD-10-CM | POA: Diagnosis not present

## 2017-06-25 DIAGNOSIS — N2581 Secondary hyperparathyroidism of renal origin: Secondary | ICD-10-CM | POA: Diagnosis not present

## 2017-06-26 DIAGNOSIS — H353121 Nonexudative age-related macular degeneration, left eye, early dry stage: Secondary | ICD-10-CM | POA: Diagnosis not present

## 2017-06-26 DIAGNOSIS — H353211 Exudative age-related macular degeneration, right eye, with active choroidal neovascularization: Secondary | ICD-10-CM | POA: Diagnosis not present

## 2017-06-28 ENCOUNTER — Ambulatory Visit (INDEPENDENT_AMBULATORY_CARE_PROVIDER_SITE_OTHER): Payer: Medicare HMO

## 2017-06-28 DIAGNOSIS — Z7901 Long term (current) use of anticoagulants: Secondary | ICD-10-CM | POA: Diagnosis not present

## 2017-06-28 LAB — POCT INR: INR: 2.8

## 2017-06-28 NOTE — Progress Notes (Signed)
Pre visit review using our clinic tool,if applicable. No additional management support is needed unless otherwise documented below in the visit note.   Patient in for INR check per order from Dr. Nani Ravens DOD dated 05/31/17.  Last INR = 3.0 Goal 2.0-3.0  Patient taking Coumadin 5 mg daily except for Monday and Friday 2.5 mg.  Patient states pharmacist at [facility] advised her she was due for Pneumonia Imm. Advised to discuss with Dr. Charlett Blake on next visit . Nor showing due  In system.  INR today = 2.8  Per Dr. Charlett Blake patient to continue Coumadin as she is currently taking and return in 1 month for re-check of INR.  Appointment scheduled.

## 2017-07-05 DIAGNOSIS — E669 Obesity, unspecified: Secondary | ICD-10-CM | POA: Diagnosis not present

## 2017-07-05 DIAGNOSIS — E785 Hyperlipidemia, unspecified: Secondary | ICD-10-CM | POA: Diagnosis not present

## 2017-07-05 DIAGNOSIS — N183 Chronic kidney disease, stage 3 (moderate): Secondary | ICD-10-CM | POA: Diagnosis not present

## 2017-07-05 DIAGNOSIS — I509 Heart failure, unspecified: Secondary | ICD-10-CM | POA: Diagnosis not present

## 2017-07-05 DIAGNOSIS — I129 Hypertensive chronic kidney disease with stage 1 through stage 4 chronic kidney disease, or unspecified chronic kidney disease: Secondary | ICD-10-CM | POA: Diagnosis not present

## 2017-07-05 DIAGNOSIS — I2699 Other pulmonary embolism without acute cor pulmonale: Secondary | ICD-10-CM | POA: Diagnosis not present

## 2017-07-05 DIAGNOSIS — N2581 Secondary hyperparathyroidism of renal origin: Secondary | ICD-10-CM | POA: Diagnosis not present

## 2017-07-05 DIAGNOSIS — D631 Anemia in chronic kidney disease: Secondary | ICD-10-CM | POA: Diagnosis not present

## 2017-07-05 DIAGNOSIS — N179 Acute kidney failure, unspecified: Secondary | ICD-10-CM | POA: Diagnosis not present

## 2017-07-19 ENCOUNTER — Encounter: Payer: Self-pay | Admitting: Family Medicine

## 2017-07-19 ENCOUNTER — Ambulatory Visit (INDEPENDENT_AMBULATORY_CARE_PROVIDER_SITE_OTHER): Payer: Medicare HMO | Admitting: Family Medicine

## 2017-07-19 VITALS — BP 120/70 | HR 68 | Temp 97.4°F | Resp 18 | Wt 184.4 lb

## 2017-07-19 DIAGNOSIS — M25561 Pain in right knee: Secondary | ICD-10-CM | POA: Diagnosis not present

## 2017-07-19 DIAGNOSIS — E782 Mixed hyperlipidemia: Secondary | ICD-10-CM | POA: Diagnosis not present

## 2017-07-19 DIAGNOSIS — I10 Essential (primary) hypertension: Secondary | ICD-10-CM | POA: Diagnosis not present

## 2017-07-19 DIAGNOSIS — E038 Other specified hypothyroidism: Secondary | ICD-10-CM

## 2017-07-19 DIAGNOSIS — M25562 Pain in left knee: Secondary | ICD-10-CM | POA: Diagnosis not present

## 2017-07-19 DIAGNOSIS — R739 Hyperglycemia, unspecified: Secondary | ICD-10-CM

## 2017-07-19 DIAGNOSIS — N189 Chronic kidney disease, unspecified: Secondary | ICD-10-CM

## 2017-07-19 NOTE — Assessment & Plan Note (Signed)
Encouraged heart healthy diet, increase exercise, avoid trans fats, consider a krill oil cap daily 

## 2017-07-19 NOTE — Assessment & Plan Note (Signed)
Works with [facility] and is doing well at this

## 2017-07-19 NOTE — Assessment & Plan Note (Addendum)
hgba1c acceptable, minimize simple carbs. Increase exercise as tolerated.  

## 2017-07-19 NOTE — Assessment & Plan Note (Signed)
On Levothyroxine, continue to monitor 

## 2017-07-19 NOTE — Progress Notes (Signed)
Subjective:  I acted as a Education administrator for Dr. Charlett Blake. [name], [place]  Patient ID: Emily Mitchell, female    DOB: Apr 25, 1946, 71 y.o.   MRN: [id number]  No chief complaint on file.   HPI  Patient is in today for a 3 month follow up and overall she is doing well.  No recent febrile illness or acute hospitalization she has been seen by nephrology recently and no changes have been made.  No polyuria or polydipsia.  Trying to maintain a heart healthy diet. Denies CP/palp/SOB/HA/congestion/fevers/GI or GU c/o. Taking meds as prescribed  Patient Care Team: Mosie Lukes, MD as PCP - General (Family Medicine) Rigoberto Noel, MD as Consulting Physician (Pulmonary Disease) Donato Heinz, MD as Consulting Physician (Nephrology) Tat, Eustace Quail, DO as Consulting Physician (Neurology) Antionette Fairy Isaias Cowman, MD as Consulting Physician (Ophthalmology)   Past Medical History:  Diagnosis Date  . Anemia 07/05/2014  . Anxiety and depression 12/14/2008   Qualifier: Diagnosis of  By: [name] LPN, Almyra Free    . Arthritis   . Asthma   . Atypical chest pain 05/23/2015  . Carotid artery occlusion   . CHF (congestive heart failure) ([place])   . Chicken pox as a child  . Chronic kidney disease    Bright's Disease at age 74   . COPD (chronic obstructive pulmonary disease) ([place])   . GERD (gastroesophageal reflux disease)   . H. pylori infection 10/19/2013  . Hyperglycemia 03/14/2016  . Hyperlipidemia   . Hypertension   . Hypothyroidism   . Knee pain, bilateral 12/17/2012  . Left hip pain 05/23/2015  . Low back pain 09/19/2015  . Macular degeneration of both eyes 12/16/2015   Right eye is wet Left Eye is dry Shots every 11 to 12 weeks in right eye at opthamologist office  . Medicare annual wellness visit, subsequent 02/21/2015  . Mumps 71 yrs old  . Preventative health care 03/14/2016  . Pulmonary emboli ([place]) 05/26/2013  . Tremor   . UTI (lower urinary tract infection) 10/19/2013    Past Surgical History:    Procedure Laterality Date  . ABDOMINAL HYSTERECTOMY  1984  . CAROTID ENDARTERECTOMY Right 05/10/07   cea  . CATARACT EXTRACTION Left   . EYE SURGERY     b/l  . knees replaced  2005 and 2011   both knees  . WISDOM TOOTH EXTRACTION      Family History  Problem Relation Age of Onset  . Stroke Mother        mini stroke  . Kidney disease Mother   . Heart failure Mother   . Hypertension Mother   . Diabetes Sister        type 2  . Kidney disease Sister   . Heart attack Maternal Grandfather   . Hyperlipidemia Sister     Social History   Socioeconomic History  . Marital status: Divorced    Spouse name: Not on file  . Number of children: 2  . Years of education: Not on file  . Highest education level: Not on file  Social Needs  . Financial resource strain: Not on file  . Food insecurity - worry: Not on file  . Food insecurity - inability: Not on file  . Transportation needs - medical: Not on file  . Transportation needs - non-medical: Not on file  Occupational History  . Not on file  Tobacco Use  . Smoking status: Former Smoker    Packs/day: 2.00    Years: 30.00  Pack years: 60.00    Types: Cigarettes    Last attempt to quit: 12/03/1994    Years since quitting: 22.6  . Smokeless tobacco: Never Used  Substance and Sexual Activity  . Alcohol use: Yes    Comment: rarely, 1/month  . Drug use: No  . Sexual activity: No    Comment: lives with son and friend, no dietary restrictions  Other Topics Concern  . Not on file  Social History Narrative   epworth sleepiness scale = 11 (06/15/2015)    Outpatient Medications Prior to Visit  Medication Sig Dispense Refill  . acetaminophen (TYLENOL) 500 MG tablet Take 500 mg by mouth every 6 (six) hours as needed.    Marland Kitchen albuterol (PROVENTIL HFA;VENTOLIN HFA) 108 (90 BASE) MCG/ACT inhaler Inhale 2 puffs into the lungs every 4 (four) hours as needed for wheezing.    . calcium citrate-vitamin D (CITRACAL+D) 315-200 MG-UNIT per  tablet Take 1 tablet by mouth daily.     . cetirizine (ZYRTEC) 10 MG tablet Take 1 tablet (10 mg total) by mouth daily as needed for allergies. 30 tablet 11  . Cholecalciferol (D3 MAXIMUM STRENGTH) 5000 UNITS capsule Take 5,000 Units by mouth daily.    . Choline Fenofibrate (FENOFIBRIC ACID) 135 MG CPDR TAKE 1 CAPSULE BY MOUTH ONCE DAILY 90 capsule 1  . citalopram (CELEXA) 20 MG tablet Take 2 tablets (40 mg total) by mouth daily. 180 tablet 1  . clonazePAM (KLONOPIN) 1 MG tablet TAKE 1 TABLET BY MOUTH THREE TIMES DAILY AS NEEDED FOR ANXIETY 90 tablet 2  . Cyanocobalamin (VITAMIN B-12 PO) Take by mouth.    . esomeprazole (NEXIUM) 40 MG capsule TAKE 1 CAPSULE BY MOUTH ONCE DAILY AT  12  NOON 90 capsule 1  . ezetimibe (ZETIA) 10 MG tablet TAKE 1 TABLET BY MOUTH ONCE DAILY 90 tablet 1  . FISH OIL-KRILL OIL PO Take by mouth.    . furosemide (LASIX) 20 MG tablet Take 1 tablet (20 mg total) by mouth daily as needed for edema. 30 tablet 3  . gabapentin (NEURONTIN) 100 MG capsule Take 1-2 capsules (100-200 mg total) by mouth at bedtime. 60 capsule 3  . levalbuterol (XOPENEX) 1.25 MG/0.5ML nebulizer solution Take 1.25 mg by nebulization every 4 (four) hours as needed for wheezing. 30 each 0  . levothyroxine (SYNTHROID, LEVOTHROID) 125 MCG tablet TAKE 1 TABLET BY MOUTH ONCE DAILY BEFORE BREAKFAST 90 tablet 1  . losartan (COZAAR) 25 MG tablet Take 1 tablet (25 mg total) by mouth daily. 90 tablet 1  . montelukast (SINGULAIR) 10 MG tablet Take 1 tablet (10 mg total) by mouth at bedtime as needed. 65 tablet 1  . nitroGLYCERIN (NITROSTAT) 0.4 MG SL tablet Place 1 tablet (0.4 mg total) under the tongue every 5 (five) minutes as needed for chest pain. 25 tablet 3  . omeprazole (PRILOSEC) 20 MG capsule Take 1 capsule (20 mg total) by mouth 2 (two) times daily before a meal. 28 capsule 0  . PT/INR Test STRP Check INR every 4 weeks,  PRN Dz:I26.99 48 each 0  . PT/INR Testing Monitor KIT Check INR every 4 weeks  prn Dz: l26.99 1 each 0  . ranitidine (ZANTAC) 300 MG tablet TAKE 1 TABLET BY MOUTH ONCE DAILY WITH BREAKFAST 90 tablet 1  . warfarin (COUMADIN) 5 MG tablet TAKE AS DIRECTED 90 tablet 1  . traMADol (ULTRAM) 50 MG tablet Take 1 tablet (50 mg total) by mouth every 8 (eight) hours as needed for moderate  pain or severe pain. 90 tablet 0  . valsartan (DIOVAN) 80 MG tablet Take 0.5 tablets (40 mg total) by mouth daily. 90 tablet 1   No facility-administered medications prior to visit.     Allergies  Allergen Reactions  . Niaspan [Niacin Er] Nausea And Vomiting and Swelling    Swelling in mouth  . Pantoprazole     Mouth sores  . Bupropion Other (See Comments)    Uncontrollable shakes  . Penicillins Hives  . Statins   . Sulfonamide Derivatives Hives  . Apple Rash  . Banana Rash  . Milk-Related Compounds Rash    Review of Systems  Constitutional: Negative for fever and malaise/fatigue.  HENT: Negative for congestion.   Eyes: Negative for blurred vision.  Respiratory: Negative for shortness of breath.   Cardiovascular: Negative for chest pain, palpitations and leg swelling.  Gastrointestinal: Negative for abdominal pain, blood in stool and nausea.  Genitourinary: Negative for dysuria and frequency.  Musculoskeletal: Negative for falls.  Skin: Negative for rash.  Neurological: Negative for dizziness, loss of consciousness and headaches.  Endo/Heme/Allergies: Negative for environmental allergies.  Psychiatric/Behavioral: Negative for depression. The patient is not nervous/anxious.        Objective:    Physical Exam  Constitutional: She is oriented to person, place, and time. She appears well-developed and well-nourished. No distress.  HENT:  Head: Normocephalic and atraumatic.  Nose: Nose normal.  Eyes: Right eye exhibits no discharge. Left eye exhibits no discharge.  Neck: Normal range of motion. Neck supple.  Cardiovascular: Normal rate and regular rhythm.  No murmur  heard. Pulmonary/Chest: Effort normal and breath sounds normal.  Abdominal: Soft. Bowel sounds are normal. There is no tenderness.  Musculoskeletal: She exhibits no edema.  Neurological: She is alert and oriented to person, place, and time.  Skin: Skin is warm and dry.  Psychiatric: She has a normal mood and affect.  Nursing note and vitals reviewed.   BP 120/70 (BP Location: Left Arm, Patient Position: Sitting, Cuff Size: Normal)   Pulse 68   Temp (!) 97.4 F (36.3 C) (Oral)   Resp 18   Wt 184 lb 6.4 oz (83.6 kg)   SpO2 96%   BMI 33.19 kg/m  Wt Readings from Last 3 Encounters:  07/19/17 184 lb 6.4 oz (83.6 kg)  04/19/17 179 lb (81.2 kg)  03/16/17 179 lb (81.2 kg)   BP Readings from Last 3 Encounters:  07/19/17 120/70  04/19/17 122/78  03/16/17 (!) 100/58     Immunization History  Administered Date(s) Administered  . Influenza Split 04/29/2013  . Influenza, High Dose Seasonal PF 03/30/2016, 04/19/2017  . Influenza,inj,Quad PF,6+ Mos 03/26/2014, 05/17/2015  . Pneumococcal Conjugate-13 04/29/2013  . Pneumococcal Polysaccharide-23 04/30/2014  . Tdap 05/26/2013  . Zoster 07/31/2012  . Zoster Recombinat (Shingrix) 04/27/2017, 06/13/2017    Health Maintenance  Topic Date Due  . MAMMOGRAM  08/15/2018  . COLONOSCOPY  09/25/2018  . TETANUS/TDAP  05/27/2023  . INFLUENZA VACCINE  Completed  . DEXA SCAN  Completed  . Hepatitis C Screening  Completed  . PNA vac Low Risk Adult  Completed    Lab Results  Component Value Date   WBC 6.8 04/19/2017   HGB 12.4 04/19/2017   HCT 38.3 04/19/2017   PLT 237.0 04/19/2017   GLUCOSE 76 04/19/2017   CHOL 177 04/19/2017   TRIG 243.0 (H) 04/19/2017   HDL 48.20 04/19/2017   LDLDIRECT 96.0 04/19/2017   LDLCALC 101 (H) 09/14/2016   ALT 16 04/19/2017  AST 20 04/19/2017   NA 140 04/19/2017   K 5.0 04/19/2017   CL 106 04/19/2017   CREATININE 1.92 (H) 04/19/2017   BUN 30 (H) 04/19/2017   CO2 29 04/19/2017   TSH 0.63 04/19/2017    INR 2.8 06/28/2017   HGBA1C 5.2 04/19/2017    Lab Results  Component Value Date   TSH 0.63 04/19/2017   Lab Results  Component Value Date   WBC 6.8 04/19/2017   HGB 12.4 04/19/2017   HCT 38.3 04/19/2017   MCV 92.5 04/19/2017   PLT 237.0 04/19/2017   Lab Results  Component Value Date   NA 140 04/19/2017   K 5.0 04/19/2017   CO2 29 04/19/2017   GLUCOSE 76 04/19/2017   BUN 30 (H) 04/19/2017   CREATININE 1.92 (H) 04/19/2017   BILITOT 0.4 04/19/2017   ALKPHOS 35 (L) 04/19/2017   AST 20 04/19/2017   ALT 16 04/19/2017   PROT 6.6 04/19/2017   ALBUMIN 4.0 04/19/2017   CALCIUM 9.6 04/19/2017   ANIONGAP 9 02/03/2017   GFR 27.51 (L) 04/19/2017   Lab Results  Component Value Date   CHOL 177 04/19/2017   Lab Results  Component Value Date   HDL 48.20 04/19/2017   Lab Results  Component Value Date   LDLCALC 101 (H) 09/14/2016   Lab Results  Component Value Date   TRIG 243.0 (H) 04/19/2017   Lab Results  Component Value Date   CHOLHDL 4 04/19/2017   Lab Results  Component Value Date   HGBA1C 5.2 04/19/2017         Assessment & Plan:   Problem List Items Addressed This Visit    Knee pain, bilateral - Primary   Relevant Orders   Ambulatory referral to Sports Medicine   Chronic kidney disease    Works with [facility] and is doing well at this       Hypothyroidism    On Levothyroxine, continue to monitor      Hyperlipidemia, mixed    Encouraged heart healthy diet, increase exercise, avoid trans fats, consider a krill oil cap daily      Relevant Orders   Lipid panel   Essential hypertension    Well controlled, no changes to meds. Encouraged heart healthy diet such as the DASH diet and exercise as tolerated.       Relevant Orders   CBC   Comprehensive metabolic panel   TSH   Hyperglycemia    hgba1c acceptable, minimize simple carbs. Increase exercise as tolerated.       Relevant Orders   Hemoglobin A1c      I have discontinued Pearline Cables. [place]'s valsartan and traMADol. I am also having her maintain her calcium citrate-vitamin D, Cholecalciferol, albuterol, nitroGLYCERIN, Cyanocobalamin (VITAMIN B-12 PO), acetaminophen, furosemide, warfarin, FISH OIL-KRILL OIL PO, montelukast, cetirizine, omeprazole, levalbuterol, PT/INR Testing Monitor, PT/INR Test, losartan, Fenofibric Acid, ranitidine, ezetimibe, esomeprazole, levothyroxine, gabapentin, citalopram, and clonazePAM.  No orders of the defined types were placed in this encounter.   CMA served as Education administrator during this visit. History, Physical and Plan performed by medical provider. Documentation and orders reviewed and attested to.  Penni Homans, MD

## 2017-07-19 NOTE — Patient Instructions (Addendum)
Increase leafy greens, consider increased lean red meat and using cast iron cookware. Continue to monitor, report any concerns  Lidocaine gel by the companies of [employer], [employer] and [employer]  Hypertension Hypertension is another name for high blood pressure. High blood pressure forces your heart to work harder to pump blood. This can cause problems over time. There are two numbers in a blood pressure reading. There is a top number (systolic) over a bottom number (diastolic). It is best to have a blood pressure below 120/80. Healthy choices can help lower your blood pressure. You may need medicine to help lower your blood pressure if:  Your blood pressure cannot be lowered with healthy choices.  Your blood pressure is higher than 130/80.  Follow these instructions at home: Eating and drinking  If directed, follow the DASH eating plan. This diet includes: ? Filling half of your plate at each meal with fruits and vegetables. ? Filling one quarter of your plate at each meal with whole grains. Whole grains include whole wheat pasta, brown rice, and whole grain bread. ? Eating or drinking low-fat dairy products, such as skim milk or low-fat yogurt. ? Filling one quarter of your plate at each meal with low-fat (lean) proteins. Low-fat proteins include fish, skinless chicken, eggs, beans, and tofu. ? Avoiding fatty meat, cured and processed meat, or chicken with skin. ? Avoiding premade or processed food.  Eat less than 1,500 mg of salt (sodium) a day.  Limit alcohol use to no more than 1 drink a day for nonpregnant women and 2 drinks a day for men. One drink equals 12 oz of beer, 5 oz of wine, or 1 oz of hard liquor. Lifestyle  Work with your doctor to stay at a healthy weight or to lose weight. Ask your doctor what the best weight is for you.  Get at least 30 minutes of exercise that causes your heart to beat faster (aerobic exercise) most days of the week. This may include walking,  swimming, or biking.  Get at least 30 minutes of exercise that strengthens your muscles (resistance exercise) at least 3 days a week. This may include lifting weights or pilates.  Do not use any products that contain nicotine or tobacco. This includes cigarettes and e-cigarettes. If you need help quitting, ask your doctor.  Check your blood pressure at home as told by your doctor.  Keep all follow-up visits as told by your doctor. This is important. Medicines  Take over-the-counter and prescription medicines only as told by your doctor. Follow directions carefully.  Do not skip doses of blood pressure medicine. The medicine does not work as well if you skip doses. Skipping doses also puts you at risk for problems.  Ask your doctor about side effects or reactions to medicines that you should watch for. Contact a doctor if:  You think you are having a reaction to the medicine you are taking.  You have headaches that keep coming back (recurring).  You feel dizzy.  You have swelling in your ankles.  You have trouble with your vision. Get help right away if:  You get a very bad headache.  You start to feel confused.  You feel weak or numb.  You feel faint.  You get very bad pain in your: ? Chest. ? Belly (abdomen).  You throw up (vomit) more than once.  You have trouble breathing. Summary  Hypertension is another name for high blood pressure.  Making healthy choices can help lower  blood pressure. If your blood pressure cannot be controlled with healthy choices, you may need to take medicine. This information is not intended to replace advice given to you by your health care provider. Make sure you discuss any questions you have with your health care provider. Document Released: 01/03/2008 Document Revised: 06/14/2016 Document Reviewed: 06/14/2016 Elsevier Interactive Patient Education  [employer].

## 2017-07-19 NOTE — Assessment & Plan Note (Signed)
Well controlled, no changes to meds. Encouraged heart healthy diet such as the DASH diet and exercise as tolerated.  °

## 2017-07-20 LAB — COMPREHENSIVE METABOLIC PANEL
ALBUMIN: 4.3 g/dL (ref 3.5–5.2)
ALK PHOS: 46 U/L (ref 39–117)
ALT: 14 U/L (ref 0–35)
AST: 19 U/L (ref 0–37)
BUN: 48 mg/dL — AB (ref 6–23)
CO2: 28 mEq/L (ref 19–32)
CREATININE: 1.86 mg/dL — AB (ref 0.40–1.20)
Calcium: 9.4 mg/dL (ref 8.4–10.5)
Chloride: 106 mEq/L (ref 96–112)
GFR: 28.52 mL/min — ABNORMAL LOW (ref >60.00)
Glucose, Bld: 79 mg/dL (ref 70–99)
Potassium: 5 mEq/L (ref 3.5–5.1)
SODIUM: 140 meq/L (ref 135–145)
TOTAL PROTEIN: 7.2 g/dL (ref 6.0–8.3)
Total Bilirubin: 0.4 mg/dL (ref 0.2–1.2)

## 2017-07-20 LAB — TSH: TSH: 0.5 u[IU]/mL (ref 0.35–4.50)

## 2017-07-20 LAB — HEMOGLOBIN A1C: HEMOGLOBIN A1C: 5.3 % (ref 4.6–6.5)

## 2017-07-20 LAB — CBC
HEMATOCRIT: 39.9 % (ref 36.0–46.0)
Hemoglobin: 12.8 g/dL (ref 12.0–15.0)
MCHC: 32 g/dL (ref 30.0–36.0)
MCV: 90.5 fl (ref 78.0–100.0)
Platelets: 235 10*3/uL (ref 150.0–400.0)
RBC: 4.41 Mil/uL (ref 3.87–5.11)
RDW: 14.4 % (ref 11.5–15.5)
WBC: 7.5 10*3/uL (ref 4.0–10.5)

## 2017-07-20 LAB — LIPID PANEL
Cholesterol: 150 mg/dL (ref 0–200)
HDL: 52.8 mg/dL (ref >39.00)
NonHDL: 97.64
Total CHOL/HDL Ratio: 3
Triglycerides: 226 mg/dL — ABNORMAL HIGH (ref 0.0–149.0)
VLDL: 45.2 mg/dL — ABNORMAL HIGH (ref 0.0–40.0)

## 2017-07-20 LAB — LDL CHOLESTEROL, DIRECT: Direct LDL: 95 mg/dL

## 2017-07-26 ENCOUNTER — Ambulatory Visit (INDEPENDENT_AMBULATORY_CARE_PROVIDER_SITE_OTHER): Payer: Medicare HMO

## 2017-07-26 DIAGNOSIS — Z7901 Long term (current) use of anticoagulants: Secondary | ICD-10-CM

## 2017-07-26 LAB — POCT INR: INR: 2.8

## 2017-07-26 NOTE — Progress Notes (Signed)
Pre visit review using our clinic tool,if applicable. No additional management support is needed unless otherwise documented below in the visit note.   Patient in for INR check per order from Cr. Charlett Blake.  . INR last visit was 2.8   Goal is 2.0-3.0    Currently taking Coumadin 5 mg daily except for Monday and Friday she takes 2.5 mg  INR today is 2.8 also.  Per Dr. Lorelei Pont DOD patient to continue current dose and return for INR-re-check in 1 month. Patient agreed appointment scheduled.

## 2017-08-02 ENCOUNTER — Ambulatory Visit: Payer: Medicare HMO | Admitting: Family Medicine

## 2017-08-02 ENCOUNTER — Ambulatory Visit (HOSPITAL_BASED_OUTPATIENT_CLINIC_OR_DEPARTMENT_OTHER)
Admission: RE | Admit: 2017-08-02 | Discharge: 2017-08-02 | Disposition: A | Discharge status: Home / Self Care | Payer: Medicare HMO | Source: Ambulatory Visit | Attending: Family Medicine | Admitting: Family Medicine

## 2017-08-02 VITALS — BP 106/69 | HR 71 | Ht 63.0 in | Wt 180.0 lb

## 2017-08-02 DIAGNOSIS — Z96653 Presence of artificial knee joint, bilateral: Secondary | ICD-10-CM | POA: Diagnosis not present

## 2017-08-02 DIAGNOSIS — M25562 Pain in left knee: Secondary | ICD-10-CM | POA: Insufficient documentation

## 2017-08-02 DIAGNOSIS — G8929 Other chronic pain: Secondary | ICD-10-CM

## 2017-08-02 DIAGNOSIS — M25561 Pain in right knee: Secondary | ICD-10-CM | POA: Insufficient documentation

## 2017-08-02 DIAGNOSIS — Z471 Aftercare following joint replacement surgery: Secondary | ICD-10-CM | POA: Diagnosis not present

## 2017-08-02 MED ORDER — DICLOFENAC SODIUM 1 % TD GEL: 4.0000 g | Freq: Four times a day (QID) | TRANSDERMAL | 1 refills | Status: DC

## 2017-08-02 NOTE — Patient Instructions (Signed)
Your pain and swelling are due to synovitis. These are the different medications you can take for this: Tylenol 500mg  1-2 tabs three times a day for pain. Capsaicin, aspercreme, or biofreeze topically up to four times a day may also help with pain. Some supplements that may help for arthritis: Boswellia extract, curcumin, pycnogenol Try topical voltaren gel since you can't do the anti-inflammatories by mouth. Can discuss injections with your orthopedist but there's a risk of infection of your total knee replacements if you have these. It's important that you continue to stay active. Straight leg raises, knee extensions 3 sets of 10 once a day (add ankle weight if these become too easy). Consider physical therapy to strengthen muscles around the joint that hurts to take pressure off of the joint itself - let me know if you want to do this again. Shoe inserts with good arch support may be helpful (spencos, dr. Zoe Lan active series, our sports insoles). Heat or ice 15 minutes at a time 3-4 times a day as needed to help with pain. Water aerobics and cycling with low resistance are the best two types of exercise for arthritis though any exercise is ok as long as it doesn't worsen the pain. Follow up with me in 1 month or as needed if you're doing well.

## 2017-08-07 ENCOUNTER — Encounter: Payer: Self-pay | Admitting: Family Medicine

## 2017-08-07 NOTE — Progress Notes (Signed)
PCP: Mosie Lukes, MD  Subjective:   HPI: Patient is a 72 y.o. female here for bilateral knee pain.  Patient reports she's had prior history of bilateral knee replacement (right in 2006 then 2008 a revision, left in 2011 or 2012).  She had some improvement in her knee pain but states years later now she's still struggling with pain and swelling to both anterior knee. Pain level 5/10 and sharp. Worse with walking. Both knees will give out at different times. No skin changes, numbness.  Past Medical History:  Diagnosis Date  . Anemia 07/05/2014  . Anxiety and depression 12/14/2008   Qualifier: Diagnosis of  By: [name] LPN, Almyra Free    . Arthritis   . Asthma   . Atypical chest pain 05/23/2015  . Carotid artery occlusion   . CHF (congestive heart failure) ([place])   . Chicken pox as a child  . Chronic kidney disease    Bright's Disease at age 63   . COPD (chronic obstructive pulmonary disease) ([place])   . GERD (gastroesophageal reflux disease)   . H. pylori infection 10/19/2013  . Hyperglycemia 03/14/2016  . Hyperlipidemia   . Hypertension   . Hypothyroidism   . Knee pain, bilateral 12/17/2012  . Left hip pain 05/23/2015  . Low back pain 09/19/2015  . Macular degeneration of both eyes 12/16/2015   Right eye is wet Left Eye is dry Shots every 11 to 12 weeks in right eye at opthamologist office  . Medicare annual wellness visit, subsequent 02/21/2015  . Mumps 72 yrs old  . Preventative health care 03/14/2016  . Pulmonary emboli ([place]) 05/26/2013  . Tremor   . UTI (lower urinary tract infection) 10/19/2013    Current Outpatient Medications on File Prior to Visit  Medication Sig Dispense Refill  . acetaminophen (TYLENOL) 500 MG tablet Take 500 mg by mouth every 6 (six) hours as needed.    Marland Kitchen albuterol (PROVENTIL HFA;VENTOLIN HFA) 108 (90 BASE) MCG/ACT inhaler Inhale 2 puffs into the lungs every 4 (four) hours as needed for wheezing.    . calcium citrate-vitamin D (CITRACAL+D) 315-200  MG-UNIT per tablet Take 1 tablet by mouth daily.     . cetirizine (ZYRTEC) 10 MG tablet Take 1 tablet (10 mg total) by mouth daily as needed for allergies. 30 tablet 11  . Cholecalciferol (D3 MAXIMUM STRENGTH) 5000 UNITS capsule Take 5,000 Units by mouth daily.    . Choline Fenofibrate (FENOFIBRIC ACID) 135 MG CPDR TAKE 1 CAPSULE BY MOUTH ONCE DAILY 90 capsule 1  . citalopram (CELEXA) 20 MG tablet Take 2 tablets (40 mg total) by mouth daily. 180 tablet 1  . clonazePAM (KLONOPIN) 1 MG tablet TAKE 1 TABLET BY MOUTH THREE TIMES DAILY AS NEEDED FOR ANXIETY 90 tablet 2  . Cyanocobalamin (VITAMIN B-12 PO) Take by mouth.    . esomeprazole (NEXIUM) 40 MG capsule TAKE 1 CAPSULE BY MOUTH ONCE DAILY AT  12  NOON 90 capsule 1  . ezetimibe (ZETIA) 10 MG tablet TAKE 1 TABLET BY MOUTH ONCE DAILY 90 tablet 1  . FISH OIL-KRILL OIL PO Take by mouth.    . furosemide (LASIX) 20 MG tablet Take 1 tablet (20 mg total) by mouth daily as needed for edema. 30 tablet 3  . gabapentin (NEURONTIN) 100 MG capsule Take 1-2 capsules (100-200 mg total) by mouth at bedtime. 60 capsule 3  . levalbuterol (XOPENEX) 1.25 MG/0.5ML nebulizer solution Take 1.25 mg by nebulization every 4 (four) hours as needed for wheezing. [place]  each 0  . levothyroxine (SYNTHROID, LEVOTHROID) 125 MCG tablet TAKE 1 TABLET BY MOUTH ONCE DAILY BEFORE BREAKFAST 90 tablet 1  . losartan (COZAAR) 25 MG tablet Take 1 tablet (25 mg total) by mouth daily. 90 tablet 1  . montelukast (SINGULAIR) 10 MG tablet Take 1 tablet (10 mg total) by mouth at bedtime as needed. 65 tablet 1  . nitroGLYCERIN (NITROSTAT) 0.4 MG SL tablet Place 1 tablet (0.4 mg total) under the tongue every 5 (five) minutes as needed for chest pain. 25 tablet 3  . omeprazole (PRILOSEC) 20 MG capsule Take 1 capsule (20 mg total) by mouth 2 (two) times daily before a meal. 28 capsule 0  . PT/INR Test STRP Check INR every 4 weeks,  PRN Dz:I26.99 48 each 0  . PT/INR Testing Monitor KIT Check INR every 4  weeks prn Dz: l26.99 1 each 0  . ranitidine (ZANTAC) 300 MG tablet TAKE 1 TABLET BY MOUTH ONCE DAILY WITH BREAKFAST 90 tablet 1  . warfarin (COUMADIN) 5 MG tablet TAKE AS DIRECTED 90 tablet 1   No current facility-administered medications on file prior to visit.     Past Surgical History:  Procedure Laterality Date  . ABDOMINAL HYSTERECTOMY  1984  . CAROTID ENDARTERECTOMY Right 05/10/07   cea  . CATARACT EXTRACTION Left   . EYE SURGERY     b/l  . knees replaced  2005 and 2011   both knees  . WISDOM TOOTH EXTRACTION      Allergies  Allergen Reactions  . Niaspan [Niacin Er] Nausea And Vomiting and Swelling    Swelling in mouth  . Pantoprazole     Mouth sores  . Bupropion Other (See Comments)    Uncontrollable shakes  . Penicillins Hives  . Statins   . Sulfonamide Derivatives Hives  . Apple Rash  . Banana Rash  . Milk-Related Compounds Rash    Social History   Socioeconomic History  . Marital status: Divorced    Spouse name: Not on file  . Number of children: 2  . Years of education: Not on file  . Highest education level: Not on file  Social Needs  . Financial resource strain: Not on file  . Food insecurity - worry: Not on file  . Food insecurity - inability: Not on file  . Transportation needs - medical: Not on file  . Transportation needs - non-medical: Not on file  Occupational History  . Not on file  Tobacco Use  . Smoking status: Former Smoker    Packs/day: 2.00    Years: 30.00    Pack years: 60.00    Types: Cigarettes    Last attempt to quit: 12/03/1994    Years since quitting: 22.6  . Smokeless tobacco: Never Used  Substance and Sexual Activity  . Alcohol use: Yes    Comment: rarely, 1/month  . Drug use: No  . Sexual activity: No    Comment: lives with son and friend, no dietary restrictions  Other Topics Concern  . Not on file  Social History Narrative   epworth sleepiness scale = 11 (06/15/2015)    Family History  Problem Relation Age of  Onset  . Stroke Mother        mini stroke  . Kidney disease Mother   . Heart failure Mother   . Hypertension Mother   . Diabetes Sister        type 2  . Kidney disease Sister   . Heart attack Maternal Grandfather   .  Hyperlipidemia Sister     BP 106/69   Pulse 71   Ht 5' 3" (1.6 m)   Wt 180 lb (81.6 kg)   BMI 31.89 kg/m   Review of Systems: See HPI above.     Objective:  Physical Exam:  Gen: NAD, comfortable in exam room  Right knee: No gross deformity, ecchymoses.  Mild effusion. TTP anteriorly diffusely mildly.  No pes, tendon, other tenderness. ROM 0 - 100 degrees. Negative valgus/varus testing. NV intact distally.  Left knee: No gross deformity, ecchymoses.  Mild effusion. TTP anteriorly diffusely mildly.  No pes, tendon, other tenderness. ROM 0 - 100 degrees. Negative valgus/varus testing. NV intact distally.   Assessment & Plan:  1. Bilateral knee pain - independently reviewed radiographs and no loosening of hardware, no other bony abnormalities.  Consistent with synovitis.  Discussed tylenol, topical medications, voltaren gel prescribed, supplements that may help.  Home exercises reviewed.  Encouraged to discuss possibility of injections with orthopedist - typically recommend against this due to risk of infection of total knee components.  F/u in 1 month.

## 2017-08-07 NOTE — Assessment & Plan Note (Signed)
independently reviewed radiographs and no loosening of hardware, no other bony abnormalities.  Consistent with synovitis.  Discussed tylenol, topical medications, voltaren gel prescribed, supplements that may help.  Home exercises reviewed.  Encouraged to discuss possibility of injections with orthopedist - typically recommend against this due to risk of infection of total knee components.  F/u in 1 month.

## 2017-08-09 ENCOUNTER — Other Ambulatory Visit: Payer: Self-pay | Admitting: Family Medicine

## 2017-08-09 DIAGNOSIS — E785 Hyperlipidemia, unspecified: Secondary | ICD-10-CM

## 2017-08-16 IMAGING — DX DG CHEST 2V
2 series · 2 of 2 positions shown · non-contrast
Comparison: Radiographs 05/12/2013

CLINICAL DATA: Chest pain and shortness of breath.

EXAM:
CHEST  2 VIEW

[chest pa]
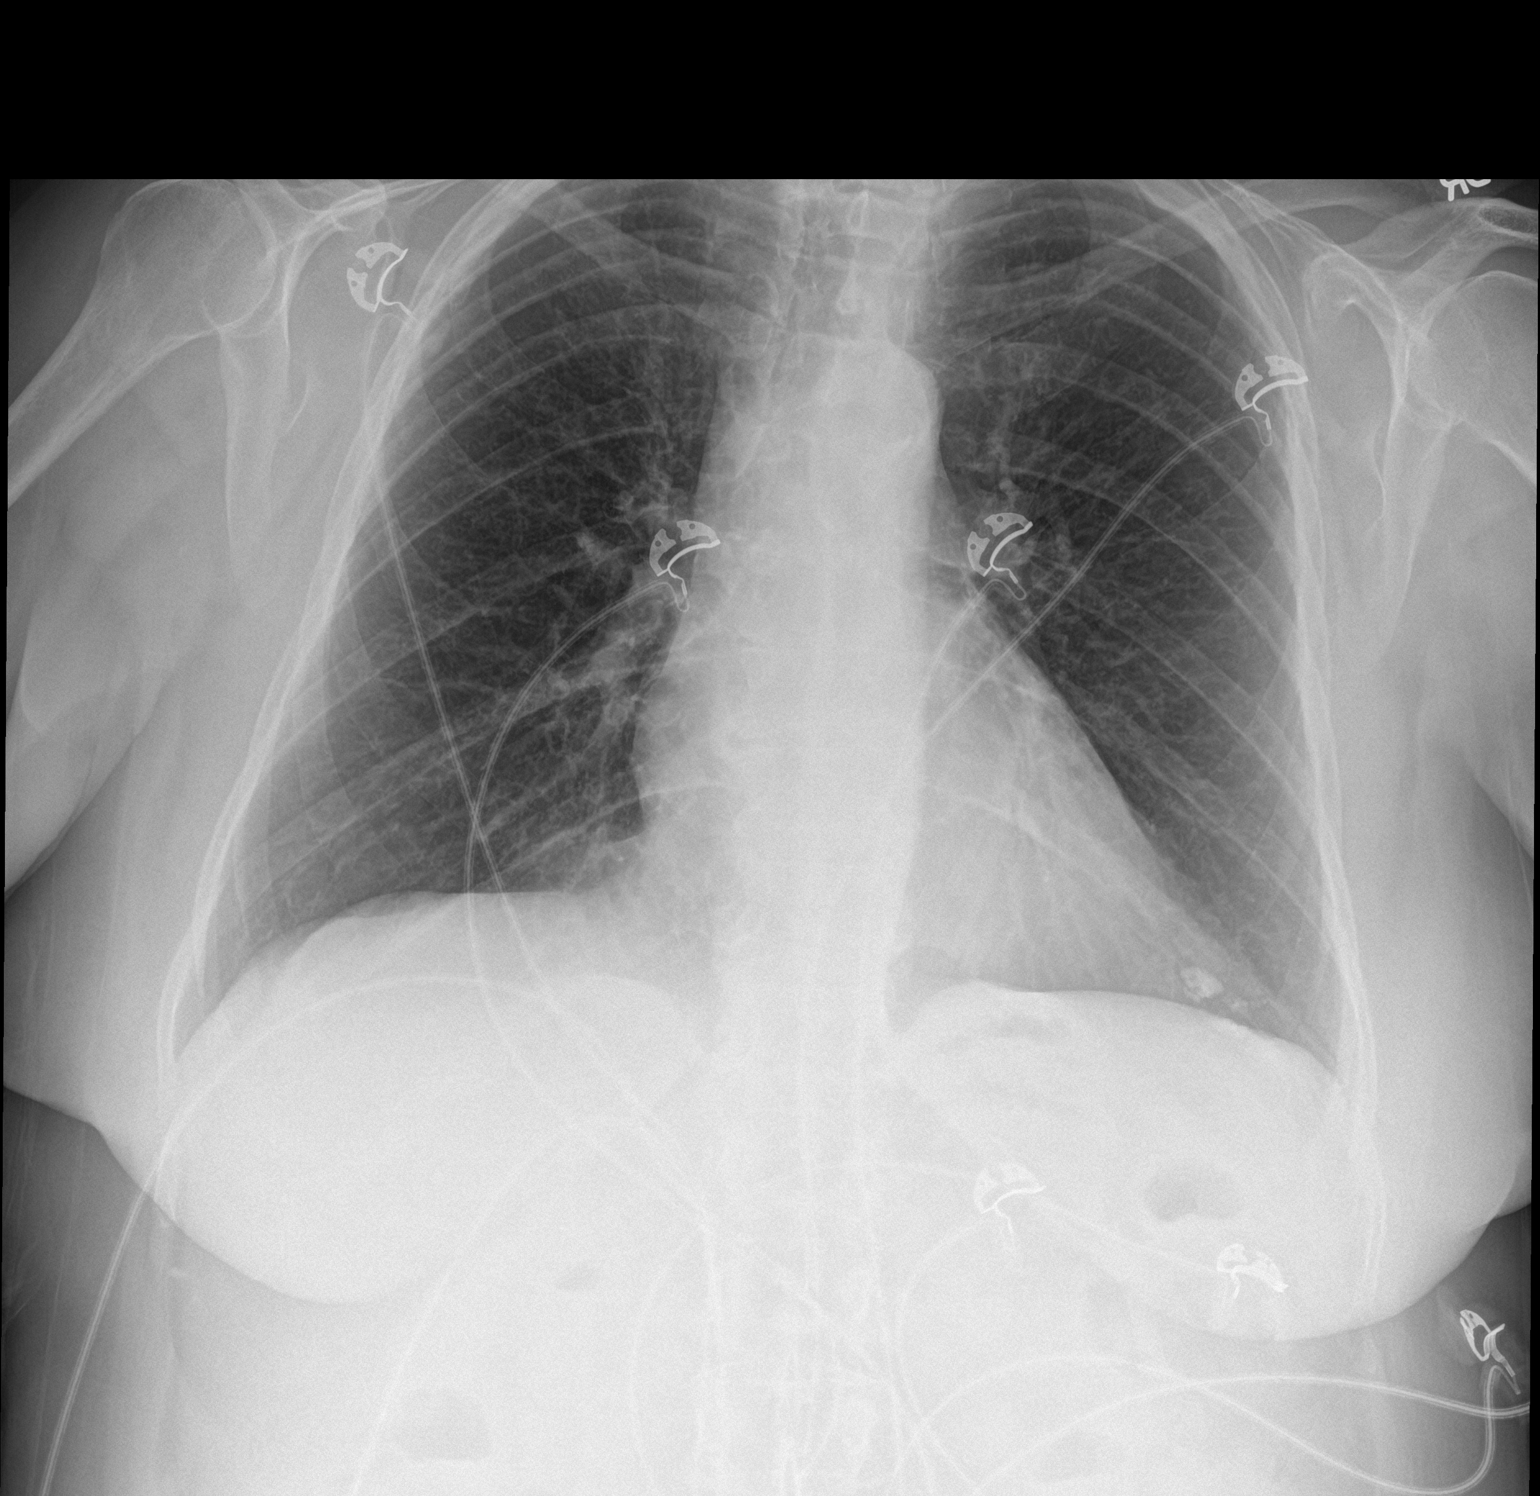

[chest lat]
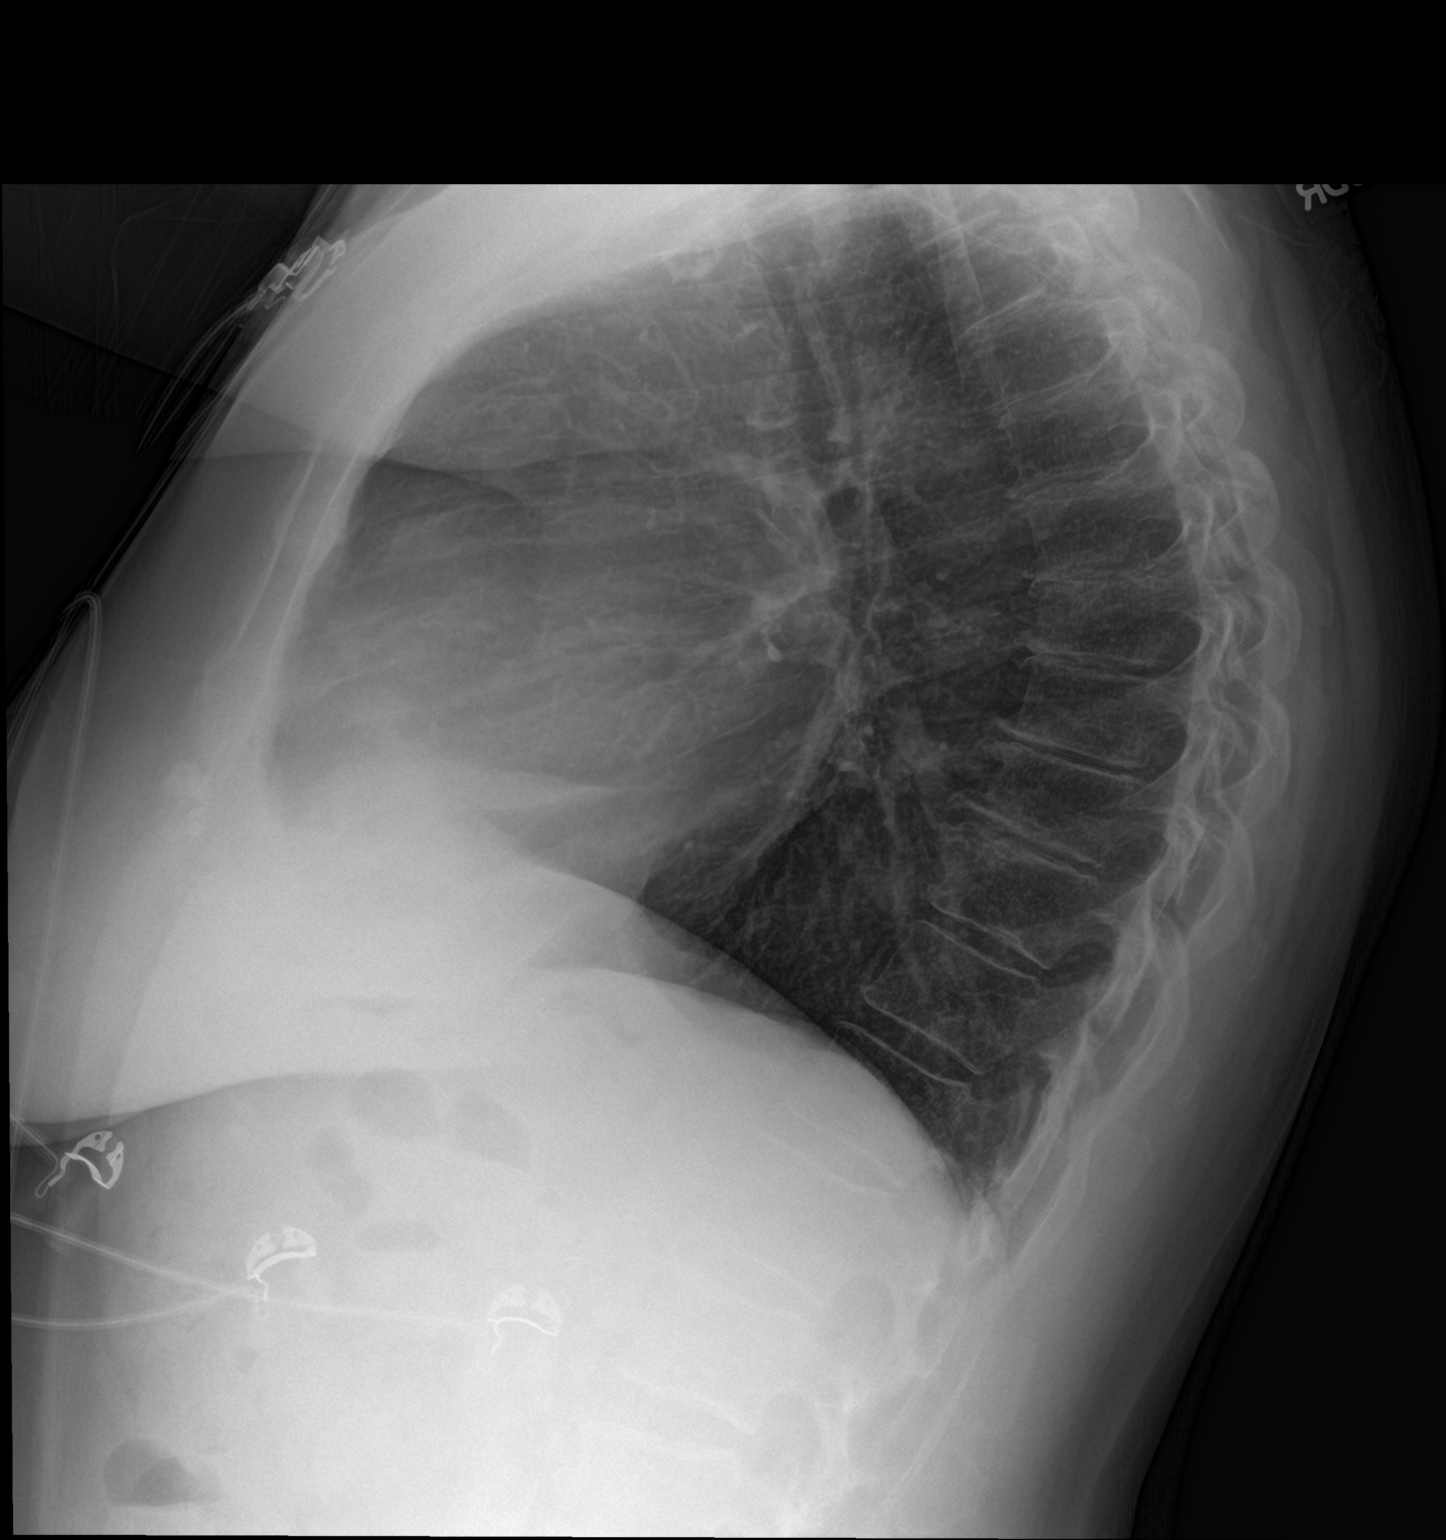

[2 of 2 positions shown; findings below may reference images not displayed]

FINDINGS: The cardiomediastinal contours are normal. Mild atherosclerosis of
the aortic arch. Mild hyperinflation, chronic. Left basilar
calcifications likely calcified granuloma, unchanged. Pulmonary
vasculature is normal. No consolidation, pleural effusion, or
pneumothorax. No acute osseous abnormalities are seen.
IMPRESSION: 1. Mild hyperinflation can be seen with asthma, bronchitis, or
smoking related lung disease.
2. No acute abnormality.  Thoracic aortic atherosclerosis.

## 2017-08-23 ENCOUNTER — Other Ambulatory Visit: Payer: Self-pay | Admitting: Family Medicine

## 2017-08-23 ENCOUNTER — Ambulatory Visit (INDEPENDENT_AMBULATORY_CARE_PROVIDER_SITE_OTHER): Payer: Medicare HMO

## 2017-08-23 DIAGNOSIS — Z7901 Long term (current) use of anticoagulants: Secondary | ICD-10-CM | POA: Diagnosis not present

## 2017-08-23 DIAGNOSIS — K219 Gastro-esophageal reflux disease without esophagitis: Secondary | ICD-10-CM

## 2017-08-23 LAB — POCT INR: INR: 1.9

## 2017-09-10 DIAGNOSIS — N2581 Secondary hyperparathyroidism of renal origin: Secondary | ICD-10-CM | POA: Diagnosis not present

## 2017-09-10 DIAGNOSIS — D631 Anemia in chronic kidney disease: Secondary | ICD-10-CM | POA: Diagnosis not present

## 2017-09-10 DIAGNOSIS — N183 Chronic kidney disease, stage 3 (moderate): Secondary | ICD-10-CM | POA: Diagnosis not present

## 2017-09-10 DIAGNOSIS — N390 Urinary tract infection, site not specified: Secondary | ICD-10-CM | POA: Diagnosis not present

## 2017-09-12 NOTE — Progress Notes (Signed)
Subjective:   Emily Mitchell is a 72 y.o. female who presents for Medicare Annual (Subsequent) preventive examination.  Review of Systems: No ROS.  Medicare Wellness Visit. Additional risk factors are reflected in the social history. Cardiac Risk Factors include: advanced age (>67mn, >>72women);dyslipidemia;hypertension;sedentary lifestyle Sleep patterns: Sleeps 8-9 hrs per pt.Takes Klonopin before bed. Home Safety/Smoke Alarms: Feels safe in home. Smoke alarms in place.  Living environment; residence and Firearm Safety: Lives with son and his friend. Seat Belt Safety/Bike Helmet: Wears seat belt.   Female:   Pap- No longer doing routine screening due to age.     Mammo- ordered       Dexa scan-   Last 03/24/15: osteopenia  CCS- last pt reported 09/25/08     Objective:     Vitals: BP 133/81 (BP Location: Right Arm, Patient Position: Sitting, Cuff Size: Normal)   Pulse 60   Wt 184 lb 6.4 oz (83.6 kg)   SpO2 95%   BMI 32.66 kg/m   Body mass index is 32.66 kg/m.  Advanced Directives 09/18/2017 02/03/2017 01/16/2017 09/14/2016 02/09/2015  Does Patient Have a Medical Advance Directive? Yes No Yes Yes Yes  Type of AParamedicof A[place]ian[place]Living will - [place] Stur[place]Living will Living will;Healthcare Power of [place]ar[place]  Does patient want to make changes to medical advance directive? - - - No - Patient declined No - Patient declined  Copy of [place] Chart? Yes - Yes Yes Yes    Tobacco Social History   Tobacco Use  Smoking Status Former Smoker  . Packs/day: 2.00  . Years: 30.00  . Pack years: 60.00  . Types: Cigarettes  . Last attempt to quit: 12/03/1994  . Years since quitting: 22.8  Smokeless Tobacco Never Used     Counseling given: Not Answered   Clinical Intake: Pain : No/denies pain     Past Medical History:  Diagnosis Date  . Anemia 07/05/2014  . Anxiety and  depression 12/14/2008   Qualifier: Diagnosis of  By: LKellie SimmeringLPN, JAlmyra Free   . Arthritis   . Asthma   . Atypical chest pain 05/23/2015  . Carotid artery occlusion   . [place] ([place]ive heart [place]) [place]   . Chicken pox as a child  . Chronic kidney disease    Bright's Disease at age 89  . COPD (chronic obstructive [place] disease) ([place]   . GERD (gastroesophageal reflux disease)   . H. pylori infection 10/19/2013  . Hyperglycemia 03/14/2016  . Hyperlipidemia   . Hypertension   . Hypothyroidism   . Knee pain, bilateral 12/17/2012  . Left hip pain 05/23/2015  . Low back pain 09/19/2015  . Macular degeneration of both eyes 12/16/2015   Right eye is wet Left Eye is dry Shots every 11 to 12 weeks in right eye at opthamologist office  . Medicare annual wellness visit, subsequent 02/21/2015  . Mumps 72yrs old  . Preventative health care 03/14/2016  . [place] 05/[place]  . Tremor   . UTI (lower urinary tract infection) 10/19/2013   Past Surgical History:  Procedure Laterality Date  . ABDOMINAL HYSTERECTOMY  1984  . CAROTID ENDARTERECTOMY Right 05/10/07   cea  . CATARACT EXTRACTION Left   . EYE SURGERY     b/l  . knees replaced  2005 and 2011   both knees  . WISDOM TOOTH EXTRACTION     Family History  Problem  Relation Age of Onset  . Stroke Mother        mini stroke  . Kidney disease Mother   . Heart failure Mother   . Hypertension Mother   . Diabetes Sister        type 2  . Kidney disease Sister   . Heart attack Maternal Grandfather   . Hyperlipidemia Sister    Social History   Socioeconomic History  . Marital status: Divorced    Spouse name: None  . Number of children: 2  . Years of education: None  . Highest education level: None  Social Needs  . Financial resource strain: None  . Food insecurity - worry: None  . Food insecurity - inability: None  . Transportation needs - medical: None  . Transportation needs - non-medical: None  Occupational  History  . None  Tobacco Use  . Smoking status: Former Smoker    Packs/day: 2.00    Years: 30.00    Pack years: 60.00    Types: Cigarettes    Last attempt to quit: 12/03/1994    Years since quitting: 22.8  . Smokeless tobacco: Never Used  Substance and Sexual Activity  . Alcohol use: Yes    Comment: rarely, 1/month  . Drug use: No  . Sexual activity: Not Currently    Comment: lives with son and friend, no dietary restrictions  Other Topics Concern  . None  Social History Narrative   epworth sleepiness scale = 11 (06/15/2015)    Outpatient Encounter Medications as of 09/18/2017  Medication Sig  . acetaminophen (TYLENOL) 500 MG tablet Take 500 mg by mouth every 6 (six) hours as needed.  Marland Kitchen albuterol (PROVENTIL HFA;VENTOLIN HFA) 108 (90 BASE) MCG/ACT inhaler Inhale 2 puffs into the lungs every 4 (four) hours as needed for wheezing.  . calcium citrate-vitamin D (CITRACAL+D) 315-200 MG-UNIT per tablet Take 1 tablet by mouth daily.   . cetirizine (ZYRTEC) 10 MG tablet Take 1 tablet (10 mg total) by mouth daily as needed for allergies.  . Cholecalciferol (D3 MAXIMUM STRENGTH) 5000 UNITS capsule Take 5,000 Units by mouth daily.  . Choline Fenofibrate (FENOFIBRIC ACID) 135 MG CPDR TAKE 1 CAPSULE BY MOUTH ONCE DAILY  . ciprofloxacin (CIPRO) 250 MG tablet   . citalopram (CELEXA) 20 MG tablet Take 2 tablets (40 mg total) by mouth daily.  . clonazePAM (KLONOPIN) 1 MG tablet TAKE 1 TABLET BY MOUTH THREE TIMES DAILY AS NEEDED FOR ANXIETY  . Cyanocobalamin (VITAMIN B-12 PO) Take by mouth.  . diclofenac sodium (VOLTAREN) 1 % GEL Apply 4 g topically 4 (four) times daily.  Marland Kitchen esomeprazole (NEXIUM) 40 MG capsule TAKE 1 CAPSULE BY MOUTH ONCE DAILY AT  12  NOON  . ezetimibe (ZETIA) 10 MG tablet TAKE 1 TABLET BY MOUTH ONCE DAILY  . FISH OIL-KRILL OIL PO Take by mouth.  . gabapentin (NEURONTIN) 100 MG capsule Take 1-2 capsules (100-200 mg total) by mouth at bedtime.  . levalbuterol (XOPENEX) 1.25 MG/0.5ML  nebulizer solution Take 1.25 mg by nebulization every 4 (four) hours as needed for wheezing.  Marland Kitchen levothyroxine (SYNTHROID, LEVOTHROID) 125 MCG tablet TAKE 1 TABLET BY MOUTH ONCE DAILY BEFORE BREAKFAST  . losartan (COZAAR) 25 MG tablet TAKE 1 TABLET BY MOUTH ONCE DAILY  . montelukast (SINGULAIR) 10 MG tablet Take 1 tablet (10 mg total) by mouth at bedtime as needed.  . nitroGLYCERIN (NITROSTAT) 0.4 MG SL tablet Place 1 tablet (0.4 mg total) under the tongue every 5 (five) minutes as needed  for chest pain.  Marland Kitchen omeprazole (PRILOSEC) 20 MG capsule Take 1 capsule (20 mg total) by mouth 2 (two) times daily before a meal.  . ranitidine (ZANTAC) 300 MG tablet TAKE 1 TABLET BY MOUTH ONCE DAILY WITH BREAKFAST  . warfarin (COUMADIN) 5 MG tablet TAKE AS DIRECTED  . furosemide (LASIX) 20 MG tablet Take 1 tablet (20 mg total) by mouth daily as needed for edema. (Patient not taking: Reported on 09/18/2017)  . PT/INR Test STRP Check INR every 4 weeks,  PRN Dz:I26.99 (Patient not taking: Reported on 09/18/2017)  . PT/INR Testing Monitor KIT Check INR every 4 weeks prn Dz: l26.99 (Patient not taking: Reported on 09/18/2017)   No facility-administered encounter medications on file as of 09/18/2017.     Activities of Daily Living In your present state of health, do you have any difficulty performing the following activities: 09/18/2017 01/16/2017  Hearing? N N  Vision? N N  Comment pt reports macular degeneration in right eye. States she is getting injections every 4 months. wears readers -  Difficulty concentrating or making decisions? N N  Walking or climbing stairs? N N  Dressing or bathing? N N  Doing errands, shopping? N N  Preparing Food and eating ? N -  Using the Toilet? N -  In the past six months, have you accidently leaked urine? N -  Do you have problems with loss of bowel control? N -  Managing your Medications? N -  Managing your Finances? N -  Housekeeping or managing your Housekeeping? N -  Some  recent data might be hidden    Patient Care Team: Mosie Lukes, MD as PCP - General (Family Medicine) Rigoberto Noel, MD as Consulting Physician (Pulmonary Disease) Donato Heinz, MD as Consulting Physician (Nephrology) Tat, Eustace Quail, DO as Consulting Physician (Neurology) Antionette Fairy Isaias Cowman, MD as Consulting Physician (Ophthalmology)    Assessment:   This is a routine wellness examination for [facility]. Physical assessment deferred to PCP.  Exercise Activities and Dietary recommendations Current Exercise Habits: The patient does not participate in regular exercise at present, Exercise limited by: None identified Diet (meal preparation, eat out, water intake, caffeinated beverages, dairy products, fruits and vegetables): in general, an "unhealthy" diet    Goals    . Exercise 3x per week (30 min per time)       Fall Risk Fall Risk  09/18/2017 03/16/2017 01/16/2017 01/16/2017 09/14/2016  Falls in the past year? No No Yes No Yes  Number falls in past yr: - - 1 - 1  Comment - - - - -  Injury with Fall? - - No - No  Comment - - Missed the cahir when she wnet to sit down.  - -  Risk Factor Category  - - - - -  Risk for fall due to : - - - - Impaired balance/gait  Follow up - - - - Education provided;Falls prevention discussed    Depression Screen PHQ 2/9 Scores 09/18/2017 01/16/2017 09/14/2016 03/14/2016  PHQ - 2 Score 0 0 0 0     Cognitive Function MMSE - Mini Mental State Exam 09/14/2016  Orientation to time 5  Orientation to Place 5  Registration 3  Attention/ Calculation 5  Recall 3  Language- name 2 objects 2  Language- repeat 1  Language- follow 3 step command 3  Language- read & follow direction 1  Write a sentence 1  Copy design 1  Total score 30  Immunization History  Administered Date(s) Administered  . Influenza Split 04/29/2013  . Influenza, High Dose Seasonal PF 03/30/2016, 04/19/2017  . Influenza,inj,Quad PF,6+ Mos 03/26/2014, 05/17/2015  .  Pneumococcal Conjugate-13 04/29/2013  . Pneumococcal Polysaccharide-23 04/30/2014  . Tdap 05/26/2013  . Zoster 07/31/2012  . Zoster Recombinat (Shingrix) 04/27/2017, 06/13/2017   Screening Tests Health Maintenance  Topic Date Due  . MAMMOGRAM  08/15/2018  . COLONOSCOPY  09/25/2018  . TETANUS/TDAP  05/27/2023  . INFLUENZA VACCINE  Completed  . DEXA SCAN  Completed  . Hepatitis C Screening  Completed  . PNA vac Low Risk Adult  Completed       Plan:   Follow up as scheduled  Continue to eat heart healthy diet (full of fruits, vegetables, whole grains, lean protein, water--limit salt, fat, and sugar intake) and increase physical activity as tolerated.  Continue doing brain stimulating activities (puzzles, reading, adult coloring books, staying active) to keep memory sharp.   Please schedule your mammogram and bone density scan.   I have personally reviewed and noted the following in the patient's chart:   . Medical and social history . Use of alcohol, tobacco or illicit drugs  . Current medications and supplements . Functional ability and status . Nutritional status . Physical activity . Advanced directives . List of other physicians . Hospitalizations, surgeries, and ER visits in previous 12 months . Vitals . Screenings to include cognitive, depression, and falls . Referrals and appointments  In addition, I have reviewed and discussed with patient certain preventive protocols, quality metrics, and best practice recommendations. A written personalized care plan for preventive services as well as general preventive health recommendations were provided to patient.     Shela Nevin, [place]  09/18/2017

## 2017-09-13 ENCOUNTER — Ambulatory Visit (INDEPENDENT_AMBULATORY_CARE_PROVIDER_SITE_OTHER): Payer: Medicare HMO

## 2017-09-13 DIAGNOSIS — Z7901 Long term (current) use of anticoagulants: Secondary | ICD-10-CM | POA: Diagnosis not present

## 2017-09-13 LAB — POCT INR: INR: 3.2

## 2017-09-13 NOTE — Progress Notes (Signed)
Pt comes in today for INR check. Last visit Pt's INR was 2.8 with a goal of 2.0 to 3.0.  Today's Pt's INR is 3.2 Pt normally takes 2.5mg  on Monday and Friday and 5mg  every other day. After consulting with Dr.Blyth Pt should keep the current regimen and follow up on 3 weeks

## 2017-09-18 ENCOUNTER — Ambulatory Visit (INDEPENDENT_AMBULATORY_CARE_PROVIDER_SITE_OTHER): Payer: Medicare HMO | Admitting: *Deleted

## 2017-09-18 ENCOUNTER — Encounter: Payer: Self-pay | Admitting: *Deleted

## 2017-09-18 VITALS — BP 133/81 | HR 60 | Wt 184.4 lb

## 2017-09-18 DIAGNOSIS — Z0000 Encounter for general adult medical examination without abnormal findings: Secondary | ICD-10-CM | POA: Diagnosis not present

## 2017-09-18 NOTE — Patient Instructions (Signed)
Follow up as scheduled  Continue to eat heart healthy diet (full of fruits, vegetables, whole grains, lean protein, water--limit salt, fat, and sugar intake) and increase physical activity as tolerated.  Continue doing brain stimulating activities (puzzles, reading, adult coloring books, staying active) to keep memory sharp.   Please schedule your mammogram and bone density scan.    Emily Mitchell , Thank you for taking time to come for your Medicare Wellness Visit. I appreciate your ongoing commitment to your health goals. Please review the following plan we discussed and let me know if I can assist you in the future.   These are the goals we discussed: Goals    . Exercise 3x per week (30 min per time)       This is a list of the screening recommended for you and due dates:  Health Maintenance  Topic Date Due  . Mammogram  08/15/2018  . Colon Cancer Screening  09/25/2018  . Tetanus Vaccine  05/27/2023  . Flu Shot  Completed  . DEXA scan (bone density measurement)  Completed  .  Hepatitis C: One time screening is recommended by Center for Disease Control  (CDC) for  adults born from [birth year] through 1963.   Completed  . Pneumonia vaccines  Completed    Health Maintenance for Postmenopausal Women Menopause is a normal process in which your reproductive ability comes to an end. This process happens gradually over a span of months to years, usually between the ages of 93 and 53. Menopause is complete when you have missed 12 consecutive menstrual periods. It is important to talk with your health care provider about some of the most common conditions that affect postmenopausal women, such as heart disease, cancer, and bone loss (osteoporosis). Adopting a healthy lifestyle and getting preventive care can help to promote your health and wellness. Those actions can also lower your chances of developing some of these common conditions. What should I know about menopause? During menopause, you may  experience a number of symptoms, such as:  Moderate-to-severe hot flashes.  Night sweats.  Decrease in sex drive.  Mood swings.  Headaches.  Tiredness.  Irritability.  Memory problems.  Insomnia.  Choosing to treat or not to treat menopausal changes is an individual decision that you make with your health care provider. What should I know about hormone replacement therapy and supplements? Hormone therapy products are effective for treating symptoms that are associated with menopause, such as hot flashes and night sweats. Hormone replacement carries certain risks, especially as you become older. If you are thinking about using estrogen or estrogen with progestin treatments, discuss the benefits and risks with your health care provider. What should I know about heart disease and stroke? Heart disease, heart attack, and stroke become more likely as you age. This may be due, in part, to the hormonal changes that your body experiences during menopause. These can affect how your body processes dietary fats, triglycerides, and cholesterol. Heart attack and stroke are both medical emergencies. There are many things that you can do to help prevent heart disease and stroke:  Have your blood pressure checked at least every 1-2 years. High blood pressure causes heart disease and increases the risk of stroke.  If you are 40-59 years old, ask your health care provider if you should take aspirin to prevent a heart attack or a stroke.  Do not use any tobacco products, including cigarettes, chewing tobacco, or electronic cigarettes. If you need help quitting, ask your health  care provider.  It is important to eat a healthy diet and maintain a healthy weight. ? Be sure to include plenty of vegetables, fruits, low-fat dairy products, and lean protein. ? Avoid eating foods that are high in solid fats, added sugars, or salt (sodium).  Get regular exercise. This is one of the most important things  that you can do for your health. ? Try to exercise for at least 150 minutes each week. The type of exercise that you do should increase your heart rate and make you sweat. This is known as moderate-intensity exercise. ? Try to do strengthening exercises at least twice each week. Do these in addition to the moderate-intensity exercise.  Know your numbers.Ask your health care provider to check your cholesterol and your blood glucose. Continue to have your blood tested as directed by your health care provider.  What should I know about cancer screening? There are several types of cancer. Take the following steps to reduce your risk and to catch any cancer development as early as possible. Breast Cancer  Practice breast self-awareness. ? This means understanding how your breasts normally appear and feel. ? It also means doing regular breast self-exams. Let your health care provider know about any changes, no matter how small.  If you are 78 or older, have a clinician do a breast exam (clinical breast exam or CBE) every year. Depending on your age, family history, and medical history, it may be recommended that you also have a yearly breast X-ray (mammogram).  If you have a family history of breast cancer, talk with your health care provider about genetic screening.  If you are at high risk for breast cancer, talk with your health care provider about having an MRI and a mammogram every year.  Breast cancer (BRCA) gene test is recommended for women who have family members with BRCA-related cancers. Results of the assessment will determine the need for genetic counseling and BRCA1 and for BRCA2 testing. BRCA-related cancers include these types: ? Breast. This occurs in males or females. ? Ovarian. ? Tubal. This may also be called fallopian tube cancer. ? Cancer of the abdominal or pelvic lining (peritoneal cancer). ? Prostate. ? Pancreatic.  Cervical, Uterine, and Ovarian Cancer Your health  care provider may recommend that you be screened regularly for cancer of the pelvic organs. These include your ovaries, uterus, and vagina. This screening involves a pelvic exam, which includes checking for microscopic changes to the surface of your cervix (Pap test).  For women ages 21-65, health care providers may recommend a pelvic exam and a Pap test every three years. For women ages 91-65, they may recommend the Pap test and pelvic exam, combined with testing for human papilloma virus (HPV), every five years. Some types of HPV increase your risk of cervical cancer. Testing for HPV may also be done on women of any age who have unclear Pap test results.  Other health care providers may not recommend any screening for nonpregnant women who are considered low risk for pelvic cancer and have no symptoms. Ask your health care provider if a screening pelvic exam is right for you.  If you have had past treatment for cervical cancer or a condition that could lead to cancer, you need Pap tests and screening for cancer for at least 20 years after your treatment. If Pap tests have been discontinued for you, your risk factors (such as having a new sexual partner) need to be reassessed to determine if you should  start having screenings again. Some women have medical problems that increase the chance of getting cervical cancer. In these cases, your health care provider may recommend that you have screening and Pap tests more often.  If you have a family history of uterine cancer or ovarian cancer, talk with your health care provider about genetic screening.  If you have vaginal bleeding after reaching menopause, tell your health care provider.  There are currently no reliable tests available to screen for ovarian cancer.  Lung Cancer Lung cancer screening is recommended for adults 43-51 years old who are at high risk for lung cancer because of a history of smoking. A yearly low-dose CT scan of the lungs is  recommended if you:  Currently smoke.  Have a history of at least 30 pack-years of smoking and you currently smoke or have quit within the past 15 years. A pack-year is smoking an average of one pack of cigarettes per day for one year.  Yearly screening should:  Continue until it has been 15 years since you quit.  Stop if you develop a health problem that would prevent you from having lung cancer treatment.  Colorectal Cancer  This type of cancer can be detected and can often be prevented.  Routine colorectal cancer screening usually begins at age 56 and continues through age 86.  If you have risk factors for colon cancer, your health care provider may recommend that you be screened at an earlier age.  If you have a family history of colorectal cancer, talk with your health care provider about genetic screening.  Your health care provider may also recommend using home test kits to check for hidden blood in your stool.  A small camera at the end of a tube can be used to examine your colon directly (sigmoidoscopy or colonoscopy). This is done to check for the earliest forms of colorectal cancer.  Direct examination of the colon should be repeated every 5-10 years until age 12. However, if early forms of precancerous polyps or small growths are found or if you have a family history or genetic risk for colorectal cancer, you may need to be screened more often.  Skin Cancer  Check your skin from head to toe regularly.  Monitor any moles. Be sure to tell your health care provider: ? About any new moles or changes in moles, especially if there is a change in a mole's shape or color. ? If you have a mole that is larger than the size of a pencil eraser.  If any of your family members has a history of skin cancer, especially at a young age, talk with your health care provider about genetic screening.  Always use sunscreen. Apply sunscreen liberally and repeatedly throughout the  day.  Whenever you are outside, protect yourself by wearing long sleeves, pants, a wide-brimmed hat, and sunglasses.  What should I know about osteoporosis? Osteoporosis is a condition in which bone destruction happens more quickly than new bone creation. After menopause, you may be at an increased risk for osteoporosis. To help prevent osteoporosis or the bone fractures that can happen because of osteoporosis, the following is recommended:  If you are 74-61 years old, get at least 1,000 mg of calcium and at least 600 mg of vitamin D per day.  If you are older than age 67 but younger than age 38, get at least 1,200 mg of calcium and at least 600 mg of vitamin D per day.  If you are older  than age 71, get at least 1,200 mg of calcium and at least 800 mg of vitamin D per day.  Smoking and excessive alcohol intake increase the risk of osteoporosis. Eat foods that are rich in calcium and vitamin D, and do weight-bearing exercises several times each week as directed by your health care provider. What should I know about how menopause affects my mental health? Depression may occur at any age, but it is more common as you become older. Common symptoms of depression include:  Low or sad mood.  Changes in sleep patterns.  Changes in appetite or eating patterns.  Feeling an overall lack of motivation or enjoyment of activities that you previously enjoyed.  Frequent crying spells.  Talk with your health care provider if you think that you are experiencing depression. What should I know about immunizations? It is important that you get and maintain your immunizations. These include:  Tetanus, diphtheria, and pertussis (Tdap) booster vaccine.  Influenza every year before the flu season begins.  Pneumonia vaccine.  Shingles vaccine.  Your health care provider may also recommend other immunizations. This information is not intended to replace advice given to you by your health care provider.  Make sure you discuss any questions you have with your health care provider. Document Released: 09/08/2005 Document Revised: 02/04/2016 Document Reviewed: 04/20/2015 Elsevier Interactive Patient Education  2018 [employer].

## 2017-09-26 DIAGNOSIS — H353211 Exudative age-related macular degeneration, right eye, with active choroidal neovascularization: Secondary | ICD-10-CM | POA: Diagnosis not present

## 2017-09-26 DIAGNOSIS — H353121 Nonexudative age-related macular degeneration, left eye, early dry stage: Secondary | ICD-10-CM | POA: Diagnosis not present

## 2017-10-04 ENCOUNTER — Ambulatory Visit (INDEPENDENT_AMBULATORY_CARE_PROVIDER_SITE_OTHER): Payer: Medicare HMO

## 2017-10-04 DIAGNOSIS — Z7901 Long term (current) use of anticoagulants: Secondary | ICD-10-CM

## 2017-10-04 LAB — POCT INR: INR: 2.6

## 2017-10-04 NOTE — Progress Notes (Signed)
Pre visit review using our clinic review tool, if applicable. No additional management support is needed unless otherwise documented below in the visit note.  Pt here today for INR check.  Pt currently taking Coumadin 2.5mg  Monday and Fridays and 5mg  all other days.   Last INR: 3.2. Goal is 2.0-3.0.  INR today: 2.6   Per Dr. Charlett Blake- continue same regimen. Recheck 4 weeks.   Nurse visit scheduled for 11/01/2017.

## 2017-10-04 NOTE — Patient Instructions (Signed)
Continue taking the same dosage: Coumadin 2.5mg  Mondays and Fridays and 5mg  all other days. Return in 4 weeks for recheck.

## 2017-10-11 ENCOUNTER — Other Ambulatory Visit: Payer: Self-pay | Admitting: Family Medicine

## 2017-10-18 ENCOUNTER — Encounter: Payer: Self-pay | Admitting: Family Medicine

## 2017-10-18 ENCOUNTER — Ambulatory Visit (INDEPENDENT_AMBULATORY_CARE_PROVIDER_SITE_OTHER): Payer: Medicare HMO | Admitting: Family Medicine

## 2017-10-18 VITALS — BP 120/70 | HR 74 | Temp 98.1°F | Resp 18 | Wt 188.2 lb

## 2017-10-18 DIAGNOSIS — Z1211 Encounter for screening for malignant neoplasm of colon: Secondary | ICD-10-CM | POA: Diagnosis not present

## 2017-10-18 DIAGNOSIS — I10 Essential (primary) hypertension: Secondary | ICD-10-CM | POA: Diagnosis not present

## 2017-10-18 DIAGNOSIS — R591 Generalized enlarged lymph nodes: Secondary | ICD-10-CM | POA: Diagnosis not present

## 2017-10-18 DIAGNOSIS — M25511 Pain in right shoulder: Secondary | ICD-10-CM

## 2017-10-18 DIAGNOSIS — E782 Mixed hyperlipidemia: Secondary | ICD-10-CM | POA: Diagnosis not present

## 2017-10-18 DIAGNOSIS — R739 Hyperglycemia, unspecified: Secondary | ICD-10-CM | POA: Diagnosis not present

## 2017-10-18 HISTORY — DX: Pain in right shoulder: M25.511

## 2017-10-18 HISTORY — DX: Generalized enlarged lymph nodes: R59.1

## 2017-10-18 MED ORDER — CITALOPRAM HYDROBROMIDE 20 MG PO TABS: 40.0000 mg | ORAL_TABLET | Freq: Every day | ORAL | 1 refills | Status: DC

## 2017-10-18 MED ORDER — CEPHALEXIN 500 MG PO CAPS: 500.0000 mg | ORAL_CAPSULE | Freq: Three times a day (TID) | ORAL | 0 refills | Status: DC

## 2017-10-18 NOTE — Assessment & Plan Note (Signed)
Small lymph nodes noted in b/l arms. Will check peripheral smear and cbc with diff she will let us know if they worsen.

## 2017-10-18 NOTE — Patient Instructions (Signed)
Shoulder Pain Many things can cause shoulder pain, including:  An injury.  Moving the arm in the same way again and again (overuse).  Joint pain (arthritis).  Follow these instructions at home: Take these actions to help with your pain:  Squeeze a soft ball or a foam pad as much as you can. This helps to prevent swelling. It also makes the arm stronger.  Take over-the-counter and prescription medicines only as told by your doctor.  If told, put ice on the area: ? Put ice in a plastic bag. ? Place a towel between your skin and the bag. ? Leave the ice on for 20 minutes, 2-3 times per day. Stop putting on ice if it does not help with the pain.  If you were given a shoulder sling or immobilizer: ? Wear it as told. ? Remove it to shower or bathe. ? Move your arm as little as possible. ? Keep your hand moving. This helps prevent swelling.  Contact a doctor if:  Your pain gets worse.  Medicine does not help your pain.  You have new pain in your arm, hand, or fingers. Get help right away if:  Your arm, hand, or fingers: ? Tingle. ? Are numb. ? Are swollen. ? Are painful. ? Turn white or blue. This information is not intended to replace advice given to you by your health care provider. Make sure you discuss any questions you have with your health care provider. Document Released: 01/03/2008 Document Revised: 03/12/2016 Document Reviewed: 11/09/2014 Elsevier Interactive Patient Education  2018 [employer]  

## 2017-10-18 NOTE — Assessment & Plan Note (Signed)
After a fall. It is improving and at this time it is tolerable she will let us know if she needs referral

## 2017-10-18 NOTE — Assessment & Plan Note (Signed)
Well controlled, no changes to meds. Encouraged heart healthy diet such as the DASH diet and exercise as tolerated.  °

## 2017-10-18 NOTE — Assessment & Plan Note (Signed)
hgba1c acceptable, minimize simple carbs. Increase exercise as tolerated.  

## 2017-10-18 NOTE — Progress Notes (Signed)
Subjective:  I acted as a Education administrator for Dr. Charlett Blake. [name], [place]  Patient ID: Emily Mitchell, female    DOB: March 01, 1946, 72 y.o.   MRN: [id number]  No chief complaint on file.   HPI  Patient is in today for a 3 month follow up and she reports she feels mostly well. She did have a fall and injured her right shoulder, it is improving slowly and no radicular symptoms. She has noticed some tenderness in her right fossa since lab work was run and some small lumps. No anorexia, fever or sign of acute illness. Denies CP/palp/SOB/HA/congestion/fevers/GI or GU c/o. Taking meds as prescribed  Patient Care Team: Mosie Lukes, MD as PCP - General (Family Medicine) Rigoberto Noel, MD as Consulting Physician (Pulmonary Disease) Donato Heinz, MD as Consulting Physician (Nephrology) Tat, Eustace Quail, DO as Consulting Physician (Neurology) Antionette Fairy Isaias Cowman, MD as Consulting Physician (Ophthalmology)   Past Medical History:  Diagnosis Date  . Anemia 07/05/2014  . Anxiety and depression 12/14/2008   Qualifier: Diagnosis of  By: [name] LPN, Almyra Free    . Arthritis   . Asthma   . Atypical chest pain 05/23/2015  . Carotid artery occlusion   . CHF (congestive heart failure) ([place])   . Chicken pox as a child  . Chronic kidney disease    Bright's Disease at age 76   . COPD (chronic obstructive pulmonary disease) ([place])   . GERD (gastroesophageal reflux disease)   . H. pylori infection 10/19/2013  . Hyperglycemia 03/14/2016  . Hyperlipidemia   . Hypertension   . Hypothyroidism   . Knee pain, bilateral 12/17/2012  . Left hip pain 05/23/2015  . Low back pain 09/19/2015  . Macular degeneration of both eyes 12/16/2015   Right eye is wet Left Eye is dry Shots every 11 to 12 weeks in right eye at opthamologist office  . Medicare annual wellness visit, subsequent 02/21/2015  . Mumps 72 yrs old  . Preventative health care 03/14/2016  . Pulmonary emboli ([place]) 05/26/2013  . Tremor   . UTI (lower urinary  tract infection) 10/19/2013    Past Surgical History:  Procedure Laterality Date  . ABDOMINAL HYSTERECTOMY  1984  . CAROTID ENDARTERECTOMY Right 05/10/07   cea  . CATARACT EXTRACTION Left   . EYE SURGERY     b/l  . knees replaced  2005 and 2011   both knees  . WISDOM TOOTH EXTRACTION      Family History  Problem Relation Age of Onset  . Stroke Mother        mini stroke  . Kidney disease Mother   . Heart failure Mother   . Hypertension Mother   . Diabetes Sister        type 2  . Kidney disease Sister   . Heart attack Maternal Grandfather   . Hyperlipidemia Sister     Social History   Socioeconomic History  . Marital status: Divorced    Spouse name: Not on file  . Number of children: 2  . Years of education: Not on file  . Highest education level: Not on file  Occupational History  . Not on file  Social Needs  . Financial resource strain: Not on file  . Food insecurity:    Worry: Not on file    Inability: Not on file  . Transportation needs:    Medical: Not on file    Non-medical: Not on file  Tobacco Use  . Smoking status: Former Smoker  Packs/day: 2.00    Years: 30.00    Pack years: 60.00    Types: Cigarettes    Last attempt to quit: 12/03/1994    Years since quitting: 22.8  . Smokeless tobacco: Never Used  Substance and Sexual Activity  . Alcohol use: Yes    Comment: rarely, 1/month  . Drug use: No  . Sexual activity: Not Currently    Comment: lives with son and friend, no dietary restrictions  Lifestyle  . Physical activity:    Days per week: Not on file    Minutes per session: Not on file  . Stress: Not on file  Relationships  . Social connections:    Talks on phone: Not on file    Gets together: Not on file    Attends religious service: Not on file    Active member of club or organization: Not on file    Attends meetings of clubs or organizations: Not on file    Relationship status: Not on file  . Intimate partner violence:    Fear of  current or ex partner: Not on file    Emotionally abused: Not on file    Physically abused: Not on file    Forced sexual activity: Not on file  Other Topics Concern  . Not on file  Social History Narrative   epworth sleepiness scale = 11 (06/15/2015)    Outpatient Medications Prior to Visit  Medication Sig Dispense Refill  . acetaminophen (TYLENOL) 500 MG tablet Take 500 mg by mouth every 6 (six) hours as needed.    Marland Kitchen albuterol (PROVENTIL HFA;VENTOLIN HFA) 108 (90 BASE) MCG/ACT inhaler Inhale 2 puffs into the lungs every 4 (four) hours as needed for wheezing.    . calcium citrate-vitamin D (CITRACAL+D) 315-200 MG-UNIT per tablet Take 1 tablet by mouth daily.     . cetirizine (ZYRTEC) 10 MG tablet Take 1 tablet (10 mg total) by mouth daily as needed for allergies. 30 tablet 11  . Cholecalciferol (D3 MAXIMUM STRENGTH) 5000 UNITS capsule Take 5,000 Units by mouth daily.    . Choline Fenofibrate (FENOFIBRIC ACID) 135 MG CPDR TAKE 1 CAPSULE BY MOUTH ONCE DAILY 90 capsule 1  . clonazePAM (KLONOPIN) 1 MG tablet TAKE 1 TABLET BY MOUTH THREE TIMES DAILY AS NEEDED FOR ANXIETY 90 tablet 2  . Cyanocobalamin (VITAMIN B-12 PO) Take by mouth.    . diclofenac sodium (VOLTAREN) 1 % GEL Apply 4 g topically 4 (four) times daily. 5 Tube 1  . esomeprazole (NEXIUM) 40 MG capsule TAKE 1 CAPSULE BY MOUTH ONCE DAILY AT  12  NOON 90 capsule 1  . ezetimibe (ZETIA) 10 MG tablet TAKE 1 TABLET BY MOUTH ONCE DAILY 90 tablet 1  . FISH OIL-KRILL OIL PO Take by mouth.    . furosemide (LASIX) 20 MG tablet Take 1 tablet (20 mg total) by mouth daily as needed for edema. 30 tablet 3  . gabapentin (NEURONTIN) 100 MG capsule TAKE 1 TO 2 CAPSULES BY MOUTH AT BEDTIME 60 capsule 3  . levalbuterol (XOPENEX) 1.25 MG/0.5ML nebulizer solution Take 1.25 mg by nebulization every 4 (four) hours as needed for wheezing. 30 each 0  . levothyroxine (SYNTHROID, LEVOTHROID) 125 MCG tablet TAKE 1 TABLET BY MOUTH ONCE DAILY BEFORE BREAKFAST 90  tablet 1  . losartan (COZAAR) 25 MG tablet TAKE 1 TABLET BY MOUTH ONCE DAILY 90 tablet 1  . montelukast (SINGULAIR) 10 MG tablet Take 1 tablet (10 mg total) by mouth at bedtime as needed. [place]  tablet 1  . nitroGLYCERIN (NITROSTAT) 0.4 MG SL tablet Place 1 tablet (0.4 mg total) under the tongue every 5 (five) minutes as needed for chest pain. 25 tablet 3  . omeprazole (PRILOSEC) 20 MG capsule Take 1 capsule (20 mg total) by mouth 2 (two) times daily before a meal. 28 capsule 0  . PT/INR Test STRP Check INR every 4 weeks,  PRN Dz:I26.99 48 each 0  . PT/INR Testing Monitor KIT Check INR every 4 weeks prn Dz: l26.99 1 each 0  . ranitidine (ZANTAC) 300 MG tablet TAKE 1 TABLET BY MOUTH ONCE DAILY WITH BREAKFAST 90 tablet 1  . warfarin (COUMADIN) 5 MG tablet TAKE AS DIRECTED 90 tablet 1  . ciprofloxacin (CIPRO) 250 MG tablet     . citalopram (CELEXA) 20 MG tablet TAKE 2 TABLETS BY MOUTH ONCE DAILY 180 tablet 1   No facility-administered medications prior to visit.     Allergies  Allergen Reactions  . Niaspan [Niacin Er] Nausea And Vomiting and Swelling    Swelling in mouth  . Pantoprazole     Mouth sores  . Bupropion Other (See Comments)    Uncontrollable shakes  . Penicillins Hives  . Statins   . Sulfonamide Derivatives Hives  . Apple Rash  . Banana Rash  . Milk-Related Compounds Rash    Review of Systems  Constitutional: Negative for fever and malaise/fatigue.  HENT: Negative for congestion.   Eyes: Negative for blurred vision.  Respiratory: Negative for shortness of breath.   Cardiovascular: Negative for chest pain, palpitations and leg swelling.  Gastrointestinal: Negative for abdominal pain, blood in stool and nausea.  Genitourinary: Negative for dysuria and frequency.  Musculoskeletal: Positive for joint pain. Negative for falls.  Skin: Negative for rash.  Neurological: Negative for dizziness, loss of consciousness and headaches.  Endo/Heme/Allergies: Negative for  environmental allergies.  Psychiatric/Behavioral: Negative for depression. The patient is not nervous/anxious.        Objective:    Physical Exam  Constitutional: She is oriented to person, place, and time. She appears well-developed and well-nourished. No distress.  HENT:  Head: Normocephalic and atraumatic.  Nose: Nose normal.  Eyes: Right eye exhibits no discharge. Left eye exhibits no discharge.  Neck: Normal range of motion. Neck supple.  Cardiovascular: Normal rate and regular rhythm.  No murmur heard. Pulmonary/Chest: Effort normal and breath sounds normal.  Abdominal: Soft. Bowel sounds are normal. There is no tenderness.  Musculoskeletal: She exhibits no edema.  Scattered, small lymph notes in b/l arms.   Neurological: She is alert and oriented to person, place, and time.  Skin: Skin is warm and dry.  Psychiatric: She has a normal mood and affect.  Nursing note and vitals reviewed.   BP 120/70 (BP Location: Left Arm, Patient Position: Sitting, Cuff Size: Normal)   Pulse 74   Temp 98.1 F (36.7 C) (Oral)   Resp 18   Wt 188 lb 3.2 oz (85.4 kg)   SpO2 98%   BMI 33.34 kg/m  Wt Readings from Last 3 Encounters:  10/18/17 188 lb 3.2 oz (85.4 kg)  09/18/17 184 lb 6.4 oz (83.6 kg)  08/02/17 180 lb (81.6 kg)   BP Readings from Last 3 Encounters:  10/18/17 120/70  09/18/17 133/81  08/02/17 106/69     Immunization History  Administered Date(s) Administered  . Influenza Split 04/29/2013  . Influenza, High Dose Seasonal PF 03/30/2016, 04/19/2017  . Influenza,inj,Quad PF,6+ Mos 03/26/2014, 05/17/2015  . Pneumococcal Conjugate-13 04/29/2013  . Pneumococcal  Polysaccharide-23 04/30/2014  . Tdap 05/26/2013  . Zoster 07/31/2012  . Zoster Recombinat (Shingrix) 04/27/2017, 06/13/2017    Health Maintenance  Topic Date Due  . MAMMOGRAM  08/15/2018  . COLONOSCOPY  09/25/2018  . TETANUS/TDAP  05/27/2023  . INFLUENZA VACCINE  Completed  . DEXA SCAN  Completed  .  Hepatitis C Screening  Completed  . PNA vac Low Risk Adult  Completed    Lab Results  Component Value Date   WBC 7.5 07/19/2017   HGB 12.8 07/19/2017   HCT 39.9 07/19/2017   PLT 235.0 07/19/2017   GLUCOSE 79 07/19/2017   CHOL 150 07/19/2017   TRIG 226.0 (H) 07/19/2017   HDL 52.80 07/19/2017   LDLDIRECT 95.0 07/19/2017   LDLCALC 101 (H) 09/14/2016   ALT 14 07/19/2017   AST 19 07/19/2017   NA 140 07/19/2017   K 5.0 07/19/2017   CL 106 07/19/2017   CREATININE 1.86 (H) 07/19/2017   BUN 48 (H) 07/19/2017   CO2 28 07/19/2017   TSH 0.50 07/19/2017   INR 2.6 10/04/2017   HGBA1C 5.3 07/19/2017    Lab Results  Component Value Date   TSH 0.50 07/19/2017   Lab Results  Component Value Date   WBC 7.5 07/19/2017   HGB 12.8 07/19/2017   HCT 39.9 07/19/2017   MCV 90.5 07/19/2017   PLT 235.0 07/19/2017   Lab Results  Component Value Date   NA 140 07/19/2017   K 5.0 07/19/2017   CO2 28 07/19/2017   GLUCOSE 79 07/19/2017   BUN 48 (H) 07/19/2017   CREATININE 1.86 (H) 07/19/2017   BILITOT 0.4 07/19/2017   ALKPHOS 46 07/19/2017   AST 19 07/19/2017   ALT 14 07/19/2017   PROT 7.2 07/19/2017   ALBUMIN 4.3 07/19/2017   CALCIUM 9.4 07/19/2017   ANIONGAP 9 02/03/2017   GFR 28.52 (L) 07/19/2017   Lab Results  Component Value Date   CHOL 150 07/19/2017   Lab Results  Component Value Date   HDL 52.80 07/19/2017   Lab Results  Component Value Date   LDLCALC 101 (H) 09/14/2016   Lab Results  Component Value Date   TRIG 226.0 (H) 07/19/2017   Lab Results  Component Value Date   CHOLHDL 3 07/19/2017   Lab Results  Component Value Date   HGBA1C 5.3 07/19/2017         Assessment & Plan:   Problem List Items Addressed This Visit    Hyperlipidemia, mixed    Encouraged heart healthy diet, increase exercise, avoid trans fats, consider a krill oil cap daily      Essential hypertension    Well controlled, no changes to meds. Encouraged heart healthy diet such as the  DASH diet and exercise as tolerated.       Hyperglycemia    hgba1c acceptable, minimize simple carbs. Increase exercise as tolerated.       Lymphadenopathy    Small lymph nodes noted in b/l arms. Will check peripheral smear and cbc with diff she will let us know if they worsen.      Relevant Orders   CBC w/Diff   Pathologist smear review   Right shoulder pain    After a fall. It is improving and at this time it is tolerable she will let us know if she needs referral       Other Visit Diagnoses    Colon cancer screening    -  Primary   Relevant Orders   Ambulatory referral  to Gastroenterology      I have discontinued Pearline Cables. Fryberger's ciprofloxacin. I have also changed her citalopram. Additionally, I am having her start on cephALEXin. Lastly, I am having her maintain her calcium citrate-vitamin D, Cholecalciferol, albuterol, nitroGLYCERIN, Cyanocobalamin (VITAMIN B-12 PO), acetaminophen, furosemide, FISH OIL-KRILL OIL PO, montelukast, cetirizine, omeprazole, levalbuterol, PT/INR Testing Monitor, PT/INR Test, clonazePAM, diclofenac sodium, ezetimibe, warfarin, losartan, ranitidine, esomeprazole, Fenofibric Acid, levothyroxine, and gabapentin.  Meds ordered this encounter  Medications  . citalopram (CELEXA) 20 MG tablet    Sig: Take 2 tablets (40 mg total) by mouth daily.    Dispense:  180 tablet    Refill:  1  . cephALEXin (KEFLEX) 500 MG capsule    Sig: Take 1 capsule (500 mg total) by mouth 3 (three) times daily.    Dispense:  21 capsule    Refill:  0    CMA served as scribe during this visit. History, Physical and Plan performed by medical provider. Documentation and orders reviewed and attested to.  Penni Homans, MD

## 2017-10-18 NOTE — Assessment & Plan Note (Signed)
Encouraged heart healthy diet, increase exercise, avoid trans fats, consider a krill oil cap daily 

## 2017-10-19 LAB — CBC WITH DIFFERENTIAL/PLATELET
BASOS ABS: 0.1 10*3/uL (ref 0.0–0.1)
BASOS PCT: 1.2 % (ref 0.0–3.0)
EOS ABS: 0.1 10*3/uL (ref 0.0–0.7)
Eosinophils Relative: 1.5 % (ref 0.0–5.0)
HCT: 36.8 % (ref 36.0–46.0)
HEMOGLOBIN: 12.2 g/dL (ref 12.0–15.0)
Lymphocytes Relative: 25.7 % (ref 12.0–46.0)
Lymphs Abs: 1.8 10*3/uL (ref 0.7–4.0)
MCHC: 33.2 g/dL (ref 30.0–36.0)
MCV: 87.9 fl (ref 78.0–100.0)
MONO ABS: 0.6 10*3/uL (ref 0.1–1.0)
Monocytes Relative: 8.4 % (ref 3.0–12.0)
Neutro Abs: 4.3 10*3/uL (ref 1.4–7.7)
Neutrophils Relative %: 63.2 % (ref 43.0–77.0)
Platelets: 214 10*3/uL (ref 150.0–400.0)
RBC: 4.18 Mil/uL (ref 3.87–5.11)
RDW: 14.1 % (ref 11.5–15.5)
WBC: 6.8 10*3/uL (ref 4.0–10.5)

## 2017-10-19 LAB — PATHOLOGIST SMEAR REVIEW

## 2017-10-22 ENCOUNTER — Other Ambulatory Visit: Payer: Self-pay | Admitting: Family Medicine

## 2017-11-01 ENCOUNTER — Ambulatory Visit (INDEPENDENT_AMBULATORY_CARE_PROVIDER_SITE_OTHER): Payer: Medicare HMO | Admitting: *Deleted

## 2017-11-01 DIAGNOSIS — Z7901 Long term (current) use of anticoagulants: Secondary | ICD-10-CM | POA: Diagnosis not present

## 2017-11-01 LAB — POCT INR: INR: 3.4

## 2017-11-01 NOTE — Patient Instructions (Signed)
Skip tonight's dose of Coumadin and then resume usual dosage.  Return early next week for repeat INR. Appointment scheduled for 11/06/17 at 3:45pm.

## 2017-11-01 NOTE — Progress Notes (Signed)
Pre visit review using our clinic review tool, if applicable. No additional management support is needed unless otherwise documented below in the visit note.  Pt here for INR check per Dr Charlett Blake.  INR goal = 2.0-3.0  Last INR = 2.6  Pt currently taking Coumadin 2.5mg  on Mondays and Fridays and 5mg  on all other days.  No dietary changes and no unusual bruising / bleeding. Pt completed cephalexin 1 week ago.   INR today = 3.4  Advised pt to skip tonight's dose and then resume usual dosage as stated above. Return early next week for repeat INR. Appointment scheduled for 11/06/17 at 3:45pm.

## 2017-11-02 ENCOUNTER — Ambulatory Visit: Payer: Medicare HMO | Admitting: Pulmonary Disease

## 2017-11-05 DIAGNOSIS — N183 Chronic kidney disease, stage 3 (moderate): Secondary | ICD-10-CM | POA: Diagnosis not present

## 2017-11-05 DIAGNOSIS — D631 Anemia in chronic kidney disease: Secondary | ICD-10-CM | POA: Diagnosis not present

## 2017-11-06 ENCOUNTER — Ambulatory Visit (INDEPENDENT_AMBULATORY_CARE_PROVIDER_SITE_OTHER): Payer: Medicare HMO | Admitting: *Deleted

## 2017-11-06 ENCOUNTER — Other Ambulatory Visit: Payer: Medicare HMO

## 2017-11-06 ENCOUNTER — Encounter: Payer: Self-pay | Admitting: Pulmonary Disease

## 2017-11-06 ENCOUNTER — Ambulatory Visit: Payer: Medicare HMO | Admitting: Pulmonary Disease

## 2017-11-06 VITALS — BP 124/80 | HR 66 | Ht 63.0 in | Wt 186.5 lb

## 2017-11-06 DIAGNOSIS — J449 Chronic obstructive pulmonary disease, unspecified: Secondary | ICD-10-CM

## 2017-11-06 DIAGNOSIS — I2782 Chronic pulmonary embolism: Secondary | ICD-10-CM | POA: Diagnosis not present

## 2017-11-06 DIAGNOSIS — J4489 Other specified chronic obstructive pulmonary disease: Secondary | ICD-10-CM

## 2017-11-06 DIAGNOSIS — Z7901 Long term (current) use of anticoagulants: Secondary | ICD-10-CM | POA: Diagnosis not present

## 2017-11-06 HISTORY — DX: Other specified chronic obstructive pulmonary disease: J44.89

## 2017-11-06 HISTORY — DX: Chronic obstructive pulmonary disease, unspecified: J44.9

## 2017-11-06 LAB — POCT INR: INR: 2.5

## 2017-11-06 MED ORDER — BUDESONIDE-FORMOTEROL FUMARATE 80-4.5 MCG/ACT IN AERO: 2.0000 | INHALATION_SPRAY | Freq: Two times a day (BID) | RESPIRATORY_TRACT | 0 refills | Status: DC

## 2017-11-06 NOTE — Patient Instructions (Addendum)
Allergy testing  I will discuss with Dr. Randel Pigg if blood thinner can be stopped since you have not had a blood clot in 5 years  Breathing test shows airway obstruction. Sample of Symbicort 80 - 2 puffs twice daily

## 2017-11-06 NOTE — Assessment & Plan Note (Signed)
Lung function seems to have dropped significantly compared to 2014.  She quit smoking a long time ago so this does not seem to be worsening COPD but likely a component of reversible airway obstruction. Unfortunately bronchodilator testing could not be performed today.  We will trial her on Symbicort 80/4.5 and reassess in 1 month. Given her history of exposure to cats, will proceed with allergy testing

## 2017-11-06 NOTE — Assessment & Plan Note (Signed)
Not clear why she has been continued anticoagulation, single episode of PE was in 2014. I have reviewed prior cardiology and PCP notes and could not pick up any other cause for anticoagulation. Can consider stopping this

## 2017-11-06 NOTE — Progress Notes (Signed)
Pre visit review using our clinic review tool, if applicable. No additional management support is needed unless otherwise documented below in the visit note.  Pt here for INR check per Dr Charlett Blake.  Goal = 2.0 - 3.0  Last INR = 3.4  Pt currently taking Comadin 2.5mg  on Mondays and Fridays and 5mg  on all other days.  (total = 30mg /wk)  INR today = 2.5  Advised pt per Dr Charlett Blake to continue current dose and recheck INR in 1 month. Appointment scheduled for 12/06/17 at 3pm.  Pt has follow up with PCP on 02/21/18.   Pt states she does not remember if she had DVT or other PE event as a child or young adulthood. States she does not remember much about her younger years.  Reports a 1st cousin with DVTs.

## 2017-11-06 NOTE — Addendum Note (Signed)
Addended by: Valerie Salts on: 11/06/2017 03:14 PM   Modules accepted: Orders

## 2017-11-06 NOTE — Progress Notes (Signed)
Subjective:    Patient ID: Emily Mitchell, female    DOB: 1945-11-02, 72 y.o.   MRN: [id number]  HPI  72 y.o with  obstructive sleep apnea , presents to establish care for dyspnea   She was diagnosed with PE in 01/2013 at [facility] -maintained on Coumadin since then .  Her INR has been difficult to regulate. Placed on O2 during sleep , dc'd 12/2014 Her OSA was mild and she did not require CPAP  Last seen 12/2015 For the last year she has been complaining of increased dyspnea and intermittent wheezing.  She lives with her son who has a International aid/development worker now. This tenant seems to have 10 cats in his room and 2 dogs.  She wonders if she is allergic and whether that is causing her dyspnea.   She has not needed to use albuterol MDI or Xopenex nebs.  She feels that albuterol causes her to be jittery  Her weight has been steady and if anything she has lost about 10 pounds since her sleep study.  She is taking shots in her right eye at [facility] for macular degeneration.  Spirometry today shows severe airway obstruction with ratio 60, FEV1 of 41% and FVC of 52%   Significant tests/ events  PSG at [facility] 08/2011 -217 lbs -AHI 4/h, TST 319 mins,supine - and Dr Jarome Lamas said she did not need CPAP. Dr Luan Pulling said she did  Copd dx 2004. Pt saw [facility] [facility] pulm in the past.  Spirometry 04/2013: FEV1 72% predicted FEV1 FVC ratio 73   PSG 07/2013 -208 lbs -showed mild OSA TST - 390 mins,  AHI  6/h, RDI of 8/h The lowest desaturation was 87%   Echo 08/2014 grade 2 DD  12/2014 ONO - no desatn >> dc O2  Past Medical History:  Diagnosis Date  . Anemia 07/05/2014  . Anxiety and depression 12/14/2008   Qualifier: Diagnosis of  By: [name] LPN, Almyra Free    . Arthritis   . Asthma   . Atypical chest pain 05/23/2015  . Carotid artery occlusion   . CHF (congestive heart failure) ([place])   . Chicken pox as a child  . Chronic kidney disease    Bright's Disease at age 35   . COPD (chronic  obstructive pulmonary disease) ([place])   . GERD (gastroesophageal reflux disease)   . H. pylori infection 10/19/2013  . Hyperglycemia 03/14/2016  . Hyperlipidemia   . Hypertension   . Hypothyroidism   . Knee pain, bilateral 12/17/2012  . Left hip pain 05/23/2015  . Low back pain 09/19/2015  . Macular degeneration of both eyes 12/16/2015   Right eye is wet Left Eye is dry Shots every 11 to 12 weeks in right eye at opthamologist office  . Medicare annual wellness visit, subsequent 02/21/2015  . Mumps 72 yrs old  . Preventative health care 03/14/2016  . Pulmonary emboli ([place]) 05/26/2013  . Tremor   . UTI (lower urinary tract infection) 10/19/2013    Past Surgical History:  Procedure Laterality Date  . ABDOMINAL HYSTERECTOMY  1984  . CAROTID ENDARTERECTOMY Right 05/10/07   cea  . CATARACT EXTRACTION Left   . EYE SURGERY     b/l  . knees replaced  2005 and 2011   both knees  . WISDOM TOOTH EXTRACTION      Allergies  Allergen Reactions  . Niaspan [Niacin Er] Nausea And Vomiting and Swelling    Swelling in mouth  . Pantoprazole  Mouth sores  . Bupropion Other (See Comments)    Uncontrollable shakes  . Penicillins Hives  . Statins   . Sulfonamide Derivatives Hives  . Apple Rash  . Banana Rash  . Milk-Related Compounds Rash    Social History   Socioeconomic History  . Marital status: Divorced    Spouse name: Not on file  . Number of children: 2  . Years of education: Not on file  . Highest education level: Not on file  Occupational History  . Not on file  Social Needs  . Financial resource strain: Not on file  . Food insecurity:    Worry: Not on file    Inability: Not on file  . Transportation needs:    Medical: Not on file    Non-medical: Not on file  Tobacco Use  . Smoking status: Former Smoker    Packs/day: 2.00    Years: 30.00    Pack years: 60.00    Types: Cigarettes    Last attempt to quit: 12/03/1994    Years since quitting: 22.9  . Smokeless tobacco:  Never Used  Substance and Sexual Activity  . Alcohol use: Yes    Comment: rarely, 1/month  . Drug use: No  . Sexual activity: Not Currently    Comment: lives with son and friend, no dietary restrictions  Lifestyle  . Physical activity:    Days per week: Not on file    Minutes per session: Not on file  . Stress: Not on file  Relationships  . Social connections:    Talks on phone: Not on file    Gets together: Not on file    Attends religious service: Not on file    Active member of club or organization: Not on file    Attends meetings of clubs or organizations: Not on file    Relationship status: Not on file  . Intimate partner violence:    Fear of current or ex partner: Not on file    Emotionally abused: Not on file    Physically abused: Not on file    Forced sexual activity: Not on file  Other Topics Concern  . Not on file  Social History Narrative   epworth sleepiness scale = 11 (06/15/2015)    Family History  Problem Relation Age of Onset  . Stroke Mother        mini stroke  . Kidney disease Mother   . Heart failure Mother   . Hypertension Mother   . Diabetes Sister        type 2  . Kidney disease Sister   . Heart attack Maternal Grandfather   . Hyperlipidemia Sister      Review of Systems neg for any significant sore throat, dysphagia, itching, sneezing, nasal congestion or excess/ purulent secretions, fever, chills, sweats, unintended wt loss, pleuritic or exertional cp, hempoptysis, orthopnea pnd or change in chronic leg swelling.   Also denies presyncope, palpitations, heartburn, abdominal pain, nausea, vomiting, diarrhea or change in bowel or urinary habits, dysuria,hematuria, rash, arthralgias, visual complaints, headache, numbness weakness or ataxia.     Objective:   Physical Exam  Gen. Pleasant, obese, in no distress ENT - no lesions, no post nasal drip Neck: No JVD, no thyromegaly, no carotid bruits Lungs: no use of accessory muscles, no dullness  to percussion, decreased without rales or rhonchi  Cardiovascular: Rhythm regular, heart sounds  normal, no murmurs or gallops, no peripheral edema Musculoskeletal: No deformities, no cyanosis or clubbing , no  tremors       Assessment & Plan:

## 2017-11-07 LAB — RESPIRATORY ALLERGY PROFILE REGION II ~~LOC~~
Allergen, Cedar tree, t12: <0.1 kU/L
Allergen, Cottonwood, t14: <0.1 kU/L
Allergen, D pternoyssinus,d7: <0.1 kU/L
Allergen, Mouse Urine Protein, e78: <0.1 kU/L
Allergen, Mulberry, t76: <0.1 kU/L
Allergen, Oak,t7: <0.1 kU/L
Allergen, P. notatum, m1: <0.1 kU/L
Bermuda Grass: <0.1 kU/L
CLASS: 0
CLASS: 0
CLASS: 0
CLASS: 0
CLASS: 0
CLASS: 0
CLASS: 0
CLASS: 0
CLASS: 0
Cat Dander: <0.1 kU/L
Class: 0
Class: 0
Class: 0
Class: 0
Class: 0
Class: 0
Class: 0
Class: 0
Class: 0
Class: 0
Class: 0
Class: 0
Class: 0
Class: 0
Class: 0
Cockroach: <0.1 kU/L
D. farinae: <0.1 kU/L
Dog Dander: <0.1 kU/L
IgE (Immunoglobulin E), Serum: 24 kU/L (ref <=114)
Rough Pigweed  IgE: <0.1 kU/L
Sheep Sorrel IgE: <0.1 kU/L
Timothy Grass: <0.1 kU/L

## 2017-11-07 LAB — INTERPRETATION:

## 2017-11-10 DIAGNOSIS — E669 Obesity, unspecified: Secondary | ICD-10-CM | POA: Diagnosis not present

## 2017-11-10 DIAGNOSIS — E039 Hypothyroidism, unspecified: Secondary | ICD-10-CM | POA: Diagnosis not present

## 2017-11-10 DIAGNOSIS — R69 Illness, unspecified: Secondary | ICD-10-CM | POA: Diagnosis not present

## 2017-11-10 DIAGNOSIS — I509 Heart failure, unspecified: Secondary | ICD-10-CM | POA: Diagnosis not present

## 2017-11-10 DIAGNOSIS — J449 Chronic obstructive pulmonary disease, unspecified: Secondary | ICD-10-CM | POA: Diagnosis not present

## 2017-11-10 DIAGNOSIS — I110 Hypertensive heart disease with heart failure: Secondary | ICD-10-CM | POA: Diagnosis not present

## 2017-11-10 DIAGNOSIS — G8929 Other chronic pain: Secondary | ICD-10-CM | POA: Diagnosis not present

## 2017-11-10 DIAGNOSIS — E785 Hyperlipidemia, unspecified: Secondary | ICD-10-CM | POA: Diagnosis not present

## 2017-11-29 ENCOUNTER — Other Ambulatory Visit: Payer: Self-pay | Admitting: Family Medicine

## 2017-12-06 ENCOUNTER — Ambulatory Visit (INDEPENDENT_AMBULATORY_CARE_PROVIDER_SITE_OTHER): Payer: Medicare HMO

## 2017-12-06 DIAGNOSIS — Z7901 Long term (current) use of anticoagulants: Secondary | ICD-10-CM | POA: Diagnosis not present

## 2017-12-06 LAB — POCT INR: INR: 4.4

## 2017-12-06 NOTE — Patient Instructions (Addendum)
DO NOT TAKE COUMADIN TONIGHT Start back on Friday with the 2.5mg  Start 2.5mg  on Monday, Wednesday, & Fridays 5mg  on Tuesday, Thursday, Saturday & Sundays Return in 1 week     Bleeding Precautions When on Anticoagulant Therapy WHAT IS ANTICOAGULANT THERAPY? Anticoagulant therapy is taking medicine to prevent or reduce blood clots. It is also called blood thinner therapy. Blood clots that form in your blood vessels can be dangerous. They can break loose and travel to your heart, lungs, or brain. This increases your risk of a heart attack or stroke. Anticoagulant therapy causes blood to clot more slowly. You may need anticoagulant therapy if you have:  A medical condition that increases the likelihood that blood clots will form.  A heart defect or a problem with heart rhythm. It is also a common treatment after heart surgery, such as valve replacement. WHAT ARE COMMON TYPES OF ANTICOAGULANT THERAPY? Anticoagulant medicine can be injected or taken by mouth.If you need anticoagulant therapy quickly at the hospital, the medicine may be injected under your skin or given through an IV tube. Heparin is a common example of an anticoagulant that you may get at the hospital. Most anticoagulant therapy is in the form of pills that you take at home every day. These may include:  Aspirin. This common blood thinner works by preventing blood cells (platelets) from sticking together to form a clot. Aspirin is not as strong as anticoagulants that slow down the time that it takes for your body to form a clot.  Clopidogrel. This is a newer type of drug that affects platelets. It is stronger than aspirin.  Warfarin. This is the most common anticoagulant. It changes the way your body uses vitamin K, a vitamin that helps your blood to clot. The risk of bleeding is higher with warfarin than with aspirin. You will need frequent blood tests to make sure you are taking the safest amount.  New anticoagulants.  Several new drugs have been approved. They are all taken by mouth. Studies show that these drugs work as well as warfarin. They do not require blood testing. They may cause less bleeding risk than warfarin. WHAT DO I NEED TO REMEMBER WHEN TAKING ANTICOAGULANT THERAPY? Anticoagulant therapy decreases your risk of forming a blood clot, but it increases your risk of bleeding. Work closely with your health care provider to make sure you are taking your medicine safely. These tips can help:  Learn ways to reduce your risk of bleeding.  If you are taking warfarin: ? Have blood tests as ordered by your health care provider. ? Do not make any sudden changes to your diet. Vitamin K in your diet can make warfarin less effective. ? Do not get pregnant. This medicine may cause birth defects.  Take your medicine at the same time every day. If you forget to take your medicine, take it as soon as you remember. If you miss a whole day, do not double your dose of medicine. Take your normal dose and call your health care provider to check in.  Do not stop taking your medicine on your own.  Tell your health care provider before you start taking any new medicine, vitamin, or herbal product. Some of these could interfere with your therapy.  Tell all of your health care providers that you are on anticoagulant therapy.  Do not have surgery, medical procedures, or dental work until you tell your health care provider that you are on anticoagulant therapy. WHAT CAN AFFECT HOW ANTICOAGULANTS WORK?  Certain foods, vitamins, medicines, supplements, and herbal medicines change the way that anticoagulant therapy works. They may increase or decrease the effects of your anticoagulant therapy. Either result can be dangerous for you.  Many over-the-counter medicines for pain, colds, or stomach problems interfere with anticoagulant therapy. Take these only as told by your health care provider.  Do not drink alcohol. It can  interfere with your medicine and increase your risk of an injury that causes bleeding.  If you are taking warfarin, do not begin eating more foods that contain vitamin K. These include leafy green vegetables. Ask your health care provider if you should avoid any foods. WHAT ARE SOME WAYS TO PREVENT BLEEDING? You can prevent bleeding by taking certain precautions:  Be extra careful when you use knives, scissors, or other sharp objects.  Use an electric razor instead of a blade.  Do not use toothpicks.  Use a soft toothbrush.  Wear shoes that have nonskid soles.  Use bath mats and handrails in your bathroom.  Wear gloves while you do yard work.  Wear a helmet when you ride a bike.  Wear your seat belt.  Prevent falls by removing loose rugs and extension cords from areas where you walk.  Do not play contact sports or participate in other activities that have a high risk of injury. [place] PROVIDER? Call your health care provider if:  You miss a dose of medicine: ? And you are not sure what to do. ? For more than one day.  You have: ? Menstrual bleeding that is heavier than normal. ? Blood in your urine. ? A bloody nose or bleeding gums. ? Easy bruising. ? Blood in your stool (feces) or have black and tarry stool. ? Side effects from your medicine.  You feel weak or dizzy.  You become pregnant. Seek immediate medical care if:  You have bleeding that will not stop.  You have sudden and severe headache or belly pain.  You vomit or you cough up bright red blood.  You have a severe blow to your head. WHAT ARE SOME QUESTIONS TO ASK MY HEALTH CARE PROVIDER?  What is the best anticoagulant therapy for my condition?  What side effects should I watch for?  When should I take my medicine? What should I do if I forget to take it?  Will I need to have regular blood tests?  Do I need to change my diet? Are there foods or drinks that I should  avoid?  What activities are safe for me?  What should I do if I want to get pregnant? This information is not intended to replace advice given to you by your health care provider. Make sure you discuss any questions you have with your health care provider. Document Released: 06/28/2015 Document Reviewed: 06/28/2015 Elsevier Interactive Patient Education  2017 [employer].

## 2017-12-06 NOTE — Progress Notes (Signed)
Patient in today per Dr. Charlett Blake to have her INR check   Last INR: 2.5  Currently taking 2.5mg  on Monday and Fridays and 5mg  on all other days.  INR today: 4.4   Patient states she does not feel well.    Per Dr. Charlett Blake states she wants her to hold Coumadin today and start 2.5 on Monday, Wednesdays and Fridays. The other days take 5mg . Return in 1 week   Patient agreed and voiced her understanding

## 2017-12-07 ENCOUNTER — Encounter: Payer: Self-pay | Admitting: Adult Health

## 2017-12-07 ENCOUNTER — Ambulatory Visit: Payer: Medicare HMO | Admitting: Adult Health

## 2017-12-07 ENCOUNTER — Ambulatory Visit (INDEPENDENT_AMBULATORY_CARE_PROVIDER_SITE_OTHER)
Admission: RE | Admit: 2017-12-07 | Discharge: 2017-12-07 | Disposition: A | Discharge status: Home / Self Care | Payer: Medicare HMO | Source: Ambulatory Visit | Attending: Adult Health | Admitting: Adult Health

## 2017-12-07 VITALS — BP 108/62 | HR 75 | Ht 63.0 in | Wt 189.8 lb

## 2017-12-07 DIAGNOSIS — J449 Chronic obstructive pulmonary disease, unspecified: Secondary | ICD-10-CM

## 2017-12-07 DIAGNOSIS — R0602 Shortness of breath: Secondary | ICD-10-CM | POA: Diagnosis not present

## 2017-12-07 MED ORDER — BUDESONIDE-FORMOTEROL FUMARATE 80-4.5 MCG/ACT IN AERO: 2.0000 | INHALATION_SPRAY | Freq: Two times a day (BID) | RESPIRATORY_TRACT | 0 refills | Status: DC

## 2017-12-07 MED ORDER — ALBUTEROL SULFATE HFA 108 (90 BASE) MCG/ACT IN AERS: 2.0000 | INHALATION_SPRAY | Freq: Four times a day (QID) | RESPIRATORY_TRACT | 5 refills | Status: DC | PRN

## 2017-12-07 MED ORDER — BUDESONIDE-FORMOTEROL FUMARATE 80-4.5 MCG/ACT IN AERO: 2.0000 | INHALATION_SPRAY | Freq: Two times a day (BID) | RESPIRATORY_TRACT | 5 refills | Status: DC

## 2017-12-07 NOTE — Progress Notes (Signed)
'@Patient'  ID: Emily Mitchell, female    DOB: 08/29/1945, 72 y.o.   MRN: [id number]  Chief Complaint  Patient presents with  . Follow-up    COPD     Referring provider: Mosie Lukes, MD  HPI: 72 year old female former smoker followed for COPD with asthma  TEST  Significant tests/ events  PSG at [facility] 08/2011 -217 lbs -AHI 4/h, TST 319 mins,supine - and Dr Jarome Lamas said she did not need CPAP. Dr Luan Pulling said she did  Copd dx 2004. Pt saw [facility] in the past.  Spirometry 04/2013: FEV1 72% predicted FEV1 FVC ratio 73   PSG 07/2013 -208 lbs -showed mild OSA TST - 390 mins,  AHI 6/h, RDI of 8/h The lowest desaturation was 87%   Echo 08/2014 grade 2 DD  12/2014 ONO - no desatn >> dc O2  12/07/2017 Follow up: COPD w/ asthma  Patient presents for a one-month follow-up.  Last visit patient was having progressive shortness of breath.  She was found to have a decline in her COPD with FEV1 dropping down to 41%.  Patient was started on Symbicort.  Patient says she feels that this really helped.  However she only had a sample.  And ran out of it to 3 weeks ago.  She denies any chest pain orthopnea PND or increased leg swelling.     Allergies  Allergen Reactions  . Niaspan [Niacin Er] Nausea And Vomiting and Swelling    Swelling in mouth  . Pantoprazole     Mouth sores  . Bupropion Other (See Comments)    Uncontrollable shakes  . Penicillins Hives  . Statins   . Sulfonamide Derivatives Hives  . Apple Rash  . Banana Rash  . Milk-Related Compounds Rash    Immunization History  Administered Date(s) Administered  . Influenza Split 04/29/2013  . Influenza, High Dose Seasonal PF 03/30/2016, 04/19/2017  . Influenza,inj,Quad PF,6+ Mos 03/26/2014, 05/17/2015  . Pneumococcal Conjugate-13 04/29/2013  . Pneumococcal Polysaccharide-23 04/30/2014  . Tdap 05/26/2013  . Zoster 07/31/2012  . Zoster Recombinat (Shingrix) 04/27/2017, 06/13/2017    Past Medical History:    Diagnosis Date  . Anemia 07/05/2014  . Anxiety and depression 12/14/2008   Qualifier: Diagnosis of  By: [name] LPN, Almyra Free    . Arthritis   . Asthma   . Atypical chest pain 05/23/2015  . Carotid artery occlusion   . CHF (congestive heart failure) ([place])   . Chicken pox as a child  . Chronic kidney disease    Bright's Disease at age 74   . COPD (chronic obstructive pulmonary disease) ([place])   . GERD (gastroesophageal reflux disease)   . H. pylori infection 10/19/2013  . Hyperglycemia 03/14/2016  . Hyperlipidemia   . Hypertension   . Hypothyroidism   . Knee pain, bilateral 12/17/2012  . Left hip pain 05/23/2015  . Low back pain 09/19/2015  . Macular degeneration of both eyes 12/16/2015   Right eye is wet Left Eye is dry Shots every 11 to 12 weeks in right eye at opthamologist office  . Medicare annual wellness visit, subsequent 02/21/2015  . Mumps 72 yrs old  . Preventative health care 03/14/2016  . Pulmonary emboli ([place]) 05/26/2013  . Tremor   . UTI (lower urinary tract infection) 10/19/2013    Tobacco History: Social History   Tobacco Use  Smoking Status Former Smoker  . Packs/day: 2.00  . Years: 30.00  . Pack years: 60.00  . Types: Cigarettes  . Last attempt  to quit: 12/03/1994  . Years since quitting: 23.0  Smokeless Tobacco Never Used   Counseling given: Not Answered   Outpatient Encounter Medications as of 12/07/2017  Medication Sig  . acetaminophen (TYLENOL) 500 MG tablet Take 500 mg by mouth every 6 (six) hours as needed.  . calcium citrate-vitamin D (CITRACAL+D) 315-200 MG-UNIT per tablet Take 1 tablet by mouth daily.   . cetirizine (ZYRTEC) 10 MG tablet Take 1 tablet (10 mg total) by mouth daily as needed for allergies.  . Cholecalciferol (D3 MAXIMUM STRENGTH) 5000 UNITS capsule Take 5,000 Units by mouth daily.  . Choline Fenofibrate (FENOFIBRIC ACID) 135 MG CPDR TAKE 1 CAPSULE BY MOUTH ONCE DAILY  . citalopram (CELEXA) 20 MG tablet Take 2 tablets (40 mg total) by  mouth daily.  . clonazePAM (KLONOPIN) 1 MG tablet TAKE 1 TABLET BY MOUTH THREE TIMES DAILY AS NEEDED FOR ANXIETY  . Cyanocobalamin (VITAMIN B-12 PO) Take by mouth.  . diclofenac sodium (VOLTAREN) 1 % GEL Apply 4 g topically 4 (four) times daily.  Marland Kitchen esomeprazole (NEXIUM) 40 MG capsule TAKE 1 CAPSULE BY MOUTH ONCE DAILY AT  12  NOON  . ezetimibe (ZETIA) 10 MG tablet TAKE 1 TABLET BY MOUTH ONCE DAILY  . FISH OIL-KRILL OIL PO Take by mouth.  . gabapentin (NEURONTIN) 100 MG capsule TAKE 1 TO 2 CAPSULES BY MOUTH AT BEDTIME  . levothyroxine (SYNTHROID, LEVOTHROID) 125 MCG tablet TAKE 1 TABLET BY MOUTH ONCE DAILY BEFORE BREAKFAST  . losartan (COZAAR) 25 MG tablet TAKE 1 TABLET BY MOUTH ONCE DAILY  . montelukast (SINGULAIR) 10 MG tablet TAKE 1 TABLET BY MOUTH AT BEDTIME AS NEEDED  . nitroGLYCERIN (NITROSTAT) 0.4 MG SL tablet Place 1 tablet (0.4 mg total) under the tongue every 5 (five) minutes as needed for chest pain.  Marland Kitchen omeprazole (PRILOSEC) 20 MG capsule Take 1 capsule (20 mg total) by mouth 2 (two) times daily before a meal.  . PT/INR Test STRP Check INR every 4 weeks,  PRN Dz:I26.99  . PT/INR Testing Monitor KIT Check INR every 4 weeks prn Dz: l26.99  . ranitidine (ZANTAC) 300 MG tablet TAKE 1 TABLET BY MOUTH ONCE DAILY WITH BREAKFAST  . warfarin (COUMADIN) 5 MG tablet TAKE AS DIRECTED (Patient taking differently: TAKE AS DIRECTED-M-W-F take half pill other days take whole)  . albuterol (PROVENTIL HFA;VENTOLIN HFA) 108 (90 Base) MCG/ACT inhaler Inhale 2 puffs into the lungs every 6 (six) hours as needed for wheezing or shortness of breath.  . budesonide-formoterol (SYMBICORT) 80-4.5 MCG/ACT inhaler Inhale 2 puffs into the lungs 2 (two) times daily. (Patient not taking: Reported on 12/07/2017)  . budesonide-formoterol (SYMBICORT) 80-4.5 MCG/ACT inhaler Inhale 2 puffs into the lungs 2 (two) times daily.  . budesonide-formoterol (SYMBICORT) 80-4.5 MCG/ACT inhaler Inhale 2 puffs into the lungs 2 (two)  times daily.  . furosemide (LASIX) 20 MG tablet Take 1 tablet (20 mg total) by mouth daily as needed for edema. (Patient not taking: Reported on 12/07/2017)   No facility-administered encounter medications on file as of 12/07/2017.      Review of Systems  Constitutional:   No  weight loss, night sweats,  Fevers, chills, + fatigue, or  lassitude.  HEENT:   No headaches,  Difficulty swallowing,  Tooth/dental problems, or  Sore throat,                No sneezing, itching, ear ache, nasal congestion, post nasal drip,   CV:  No chest pain,  Orthopnea, PND, swelling in  lower extremities, anasarca, dizziness, palpitations, syncope.   GI  No heartburn, indigestion, abdominal pain, nausea, vomiting, diarrhea, change in bowel habits, loss of appetite, bloody stools.   Resp:    No chest wall deformity  Skin: no rash or lesions.  GU: no dysuria, change in color of urine, no urgency or frequency.  No flank pain, no hematuria   MS:  No joint pain or swelling.  No decreased range of motion.  No back pain.    Physical Exam  BP 108/62 (BP Location: Left Arm, Cuff Size: Normal)   Pulse 75   Ht '5\' 3"'  (1.6 m)   Wt 189 lb 12.8 oz (86.1 kg)   SpO2 97%   BMI 33.62 kg/m   GEN: A/Ox3; pleasant , NAD, eldelry , obese    HEENT:  [place]/AT,  EACs-clear, TMs-wnl, NOSE-clear, THROAT-clear, no lesions, no postnasal drip or exudate noted.   NECK:  Supple w/ fair ROM; no JVD; normal carotid impulses w/o bruits; no thyromegaly or nodules palpated; no lymphadenopathy.    RESP  Clear  P & A; w/o, wheezes/ rales/ or rhonchi. no accessory muscle use, no dullness to percussion  CARD:  RRR, no m/r/g, no peripheral edema, pulses intact, no cyanosis or clubbing.  GI:   Soft & nt; nml bowel sounds; no organomegaly or masses detected.   Musco: Warm bil, no deformities or joint swelling noted.   Neuro: alert, no focal deficits noted.    Skin: Warm, no lesions or rashes    Lab  Results:  CBC  BNP  Imaging: No results found.   Assessment & Plan:   COPD with asthma ([place]) Progressive COPD.  Improved symptom control on Symbicort  Plan  Patient Instructions  Restart Symbicort 33mg 2 puffs Twice daily  , rinse after use.  Check chest xray today .  Follow up with Dr. AElsworth Soho In 3 months and As needed        Pulmonary emboli Remains on coumadin . INR elevated yesterday and adjusted by PCP .  Discuss with PCP regarding length of therapy .       TRexene Edison NP 12/07/2017

## 2017-12-07 NOTE — Assessment & Plan Note (Signed)
Progressive COPD.  Improved symptom control on Symbicort  Plan  Patient Instructions  Restart Symbicort 63mcg 2 puffs Twice daily  , rinse after use.  Check chest xray today .  Follow up with Dr. Elsworth Soho  In 3 months and As needed

## 2017-12-07 NOTE — Assessment & Plan Note (Signed)
Remains on coumadin . INR elevated yesterday and adjusted by PCP .  Discuss with PCP regarding length of therapy .

## 2017-12-07 NOTE — Progress Notes (Signed)
Chest x

## 2017-12-07 NOTE — Patient Instructions (Addendum)
Restart Symbicort 43mcg 2 puffs Twice daily  , rinse after use.  Check chest xray today .  Follow up with Dr. Elsworth Soho  In 3 months and As needed

## 2017-12-12 NOTE — Progress Notes (Signed)
Reviewed & agree with plan  

## 2017-12-13 ENCOUNTER — Ambulatory Visit (INDEPENDENT_AMBULATORY_CARE_PROVIDER_SITE_OTHER): Payer: Medicare HMO

## 2017-12-13 DIAGNOSIS — Z7901 Long term (current) use of anticoagulants: Secondary | ICD-10-CM | POA: Diagnosis not present

## 2017-12-13 LAB — POCT INR: INR: 3.2

## 2017-12-13 NOTE — Progress Notes (Signed)
Pre visit review using our clinic review tool, if applicable. No additional management support is needed unless otherwise documented below in the visit note.  Pt here today for INR check. Pt denies negative findings.  Last INR: 4.4. Goal is 2.0-3.0.  Pt currently taking Coumadin 2.5mg  on Mon, Wed, Friday and 5mg  all other days.   INR today: 3.2.  Per Dr. Charlett Blake- Continue taking Coumadin 2.5mg  on Mon, Wed, Frid and 5mg  all other days. Return for recheck in 2 weeks.   Pt verbalized understanding. She will stop by front desk fot schedule.

## 2017-12-13 NOTE — Patient Instructions (Signed)
Continue Coumadin 2.5mg  on Monday, Wednesday, Friday and 5mg  all other days. Return in 2 weeks for recheck.

## 2017-12-14 ENCOUNTER — Encounter: Payer: Self-pay | Admitting: Family Medicine

## 2017-12-25 DIAGNOSIS — Z961 Presence of intraocular lens: Secondary | ICD-10-CM | POA: Diagnosis not present

## 2017-12-25 DIAGNOSIS — H35363 Drusen (degenerative) of macula, bilateral: Secondary | ICD-10-CM | POA: Diagnosis not present

## 2017-12-25 DIAGNOSIS — H04123 Dry eye syndrome of bilateral lacrimal glands: Secondary | ICD-10-CM | POA: Diagnosis not present

## 2017-12-25 DIAGNOSIS — H353121 Nonexudative age-related macular degeneration, left eye, early dry stage: Secondary | ICD-10-CM | POA: Diagnosis not present

## 2017-12-25 DIAGNOSIS — H353211 Exudative age-related macular degeneration, right eye, with active choroidal neovascularization: Secondary | ICD-10-CM | POA: Diagnosis not present

## 2017-12-25 DIAGNOSIS — H43811 Vitreous degeneration, right eye: Secondary | ICD-10-CM | POA: Diagnosis not present

## 2018-01-03 ENCOUNTER — Ambulatory Visit (INDEPENDENT_AMBULATORY_CARE_PROVIDER_SITE_OTHER): Payer: Medicare HMO

## 2018-01-03 DIAGNOSIS — Z7901 Long term (current) use of anticoagulants: Secondary | ICD-10-CM | POA: Diagnosis not present

## 2018-01-03 LAB — POCT INR: INR: 3.1 — AB (ref 2.0–3.0)

## 2018-01-03 NOTE — Progress Notes (Signed)
Pre visit review using our clinic tool,if applicable. No additional management support is needed unless otherwise documented below in the visit note.   Pt here for INR check per order from Dr. Charlett Blake  Goal INR = 2.0-3.0  Last INR = 3.2  Pt currently takes Coumadin 2.5 mg on Monday Wednesday and Friday and 5 mg on Tuesday Thursday Saturday and Sunday.  Pt denies recent antibiotics, no dietary changes and no unusual bruising / bleeding.  INR today = 3.1  Pt advised per Dr. Charlett Blake, patient to continue same dosage of Coumadin and return for INR check in 1 month Patient agreed appointment scheduled.Marland Kitchen

## 2018-01-07 DIAGNOSIS — N183 Chronic kidney disease, stage 3 (moderate): Secondary | ICD-10-CM | POA: Diagnosis not present

## 2018-01-07 DIAGNOSIS — D631 Anemia in chronic kidney disease: Secondary | ICD-10-CM | POA: Diagnosis not present

## 2018-01-08 ENCOUNTER — Other Ambulatory Visit: Payer: Self-pay | Admitting: Family Medicine

## 2018-01-09 MED ORDER — GABAPENTIN 100 MG PO CAPS: ORAL_CAPSULE | ORAL | 3 refills | Status: DC

## 2018-01-09 NOTE — Telephone Encounter (Signed)
Copied from [place] [phone]. Topic: Quick Communication - Rx Refill/Question >> Jan 09, 2018  3:38 PM Oliver Pila B wrote: Medication: clonazePAM (KLONOPIN) 1 MG tablet [[id number]] , gabapentin (NEURONTIN) 100 MG capsule [[id number]]   Has the patient contacted their pharmacy? Yes.   (Agent: If no, request that the patient contact the pharmacy for the refill.) (Agent: If yes, when and what did the pharmacy advise?)  Preferred Pharmacy (with phone number or street name): [facility]  Agent: Please be advised that RX refills may take up to 3 business days. We ask that you follow-up with your pharmacy.

## 2018-01-09 NOTE — Telephone Encounter (Signed)
Requesting: Contract: 10/26/16 UDS: 04/28/17 Moderate risk Last OV: 10/18/17 Next OV: 02/21/18 Last Refill: 05/23/17   Please advise

## 2018-01-09 NOTE — Telephone Encounter (Signed)
Refill request for clonazepam 1 MG tablet, last ordered on 05/23/17 Refill Gabapentin 100 MG capsule, last ordered on 10/12/17 LOV 10/18/17 medications addressed PCP Dr. Judene Companion for consideration

## 2018-01-10 ENCOUNTER — Other Ambulatory Visit: Payer: Self-pay

## 2018-01-10 DIAGNOSIS — N390 Urinary tract infection, site not specified: Secondary | ICD-10-CM | POA: Diagnosis not present

## 2018-01-10 MED ORDER — GABAPENTIN 100 MG PO CAPS: ORAL_CAPSULE | ORAL | 1 refills | Status: DC

## 2018-01-22 ENCOUNTER — Encounter: Payer: Self-pay | Admitting: Family Medicine

## 2018-02-05 ENCOUNTER — Ambulatory Visit (INDEPENDENT_AMBULATORY_CARE_PROVIDER_SITE_OTHER): Payer: Medicare HMO

## 2018-02-05 DIAGNOSIS — Z7901 Long term (current) use of anticoagulants: Secondary | ICD-10-CM | POA: Diagnosis not present

## 2018-02-05 LAB — POCT INR: INR: 2.8 (ref 2.0–3.0)

## 2018-02-05 NOTE — Progress Notes (Addendum)
Patient here today for INR check per Dr.Blyth.  Goal INR = 2.0-3.0  Last INR = 3.1  Pt currently takes Coumadin 2.5 mg on Monday Wednesday and Friday and 5 mg on Tuesday Thursday Saturday and Sunday.  Pt denies recent antibiotics, no dietary changes and no unusual bruising / bleeding.   INR today = 2.8  Pt advised per Dr. Charlett Blake, patient to continue same dosage of Coumadin and return for INR check in 1 month Patient agreed she has appointment with PCP on 02/28/18 she will recheck then.   Nursing INR check note reviewed. Agree with documention and plan. Nursing INR check note reviewed. Agree with documention and plan.

## 2018-02-12 IMAGING — DX DG KNEE AP/LAT W/ SUNRISE*R*
3 series · 3 of 3 positions shown · non-contrast
Comparison: None.

CLINICAL DATA: Chronic bilateral knee pain

EXAM:
RIGHT KNEE 3 VIEWS; LEFT KNEE 3 VIEWS

[knee ap]
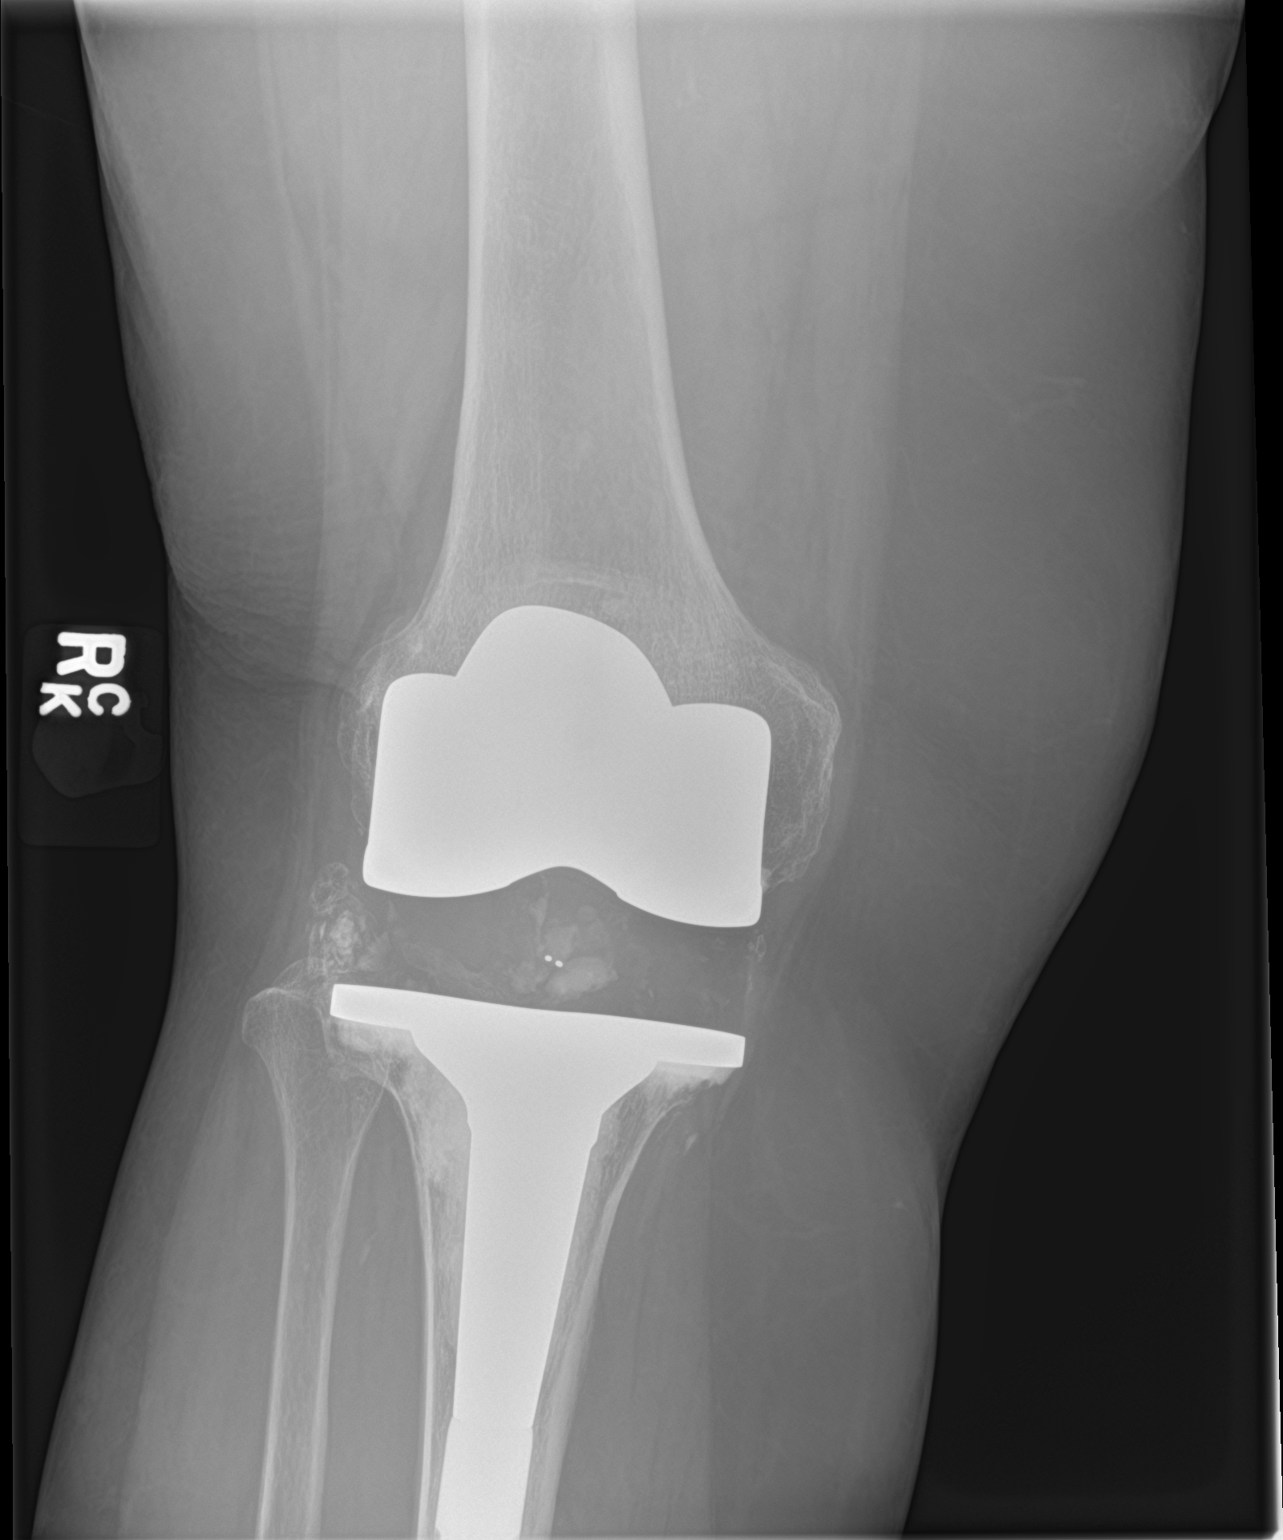

[knee lat]
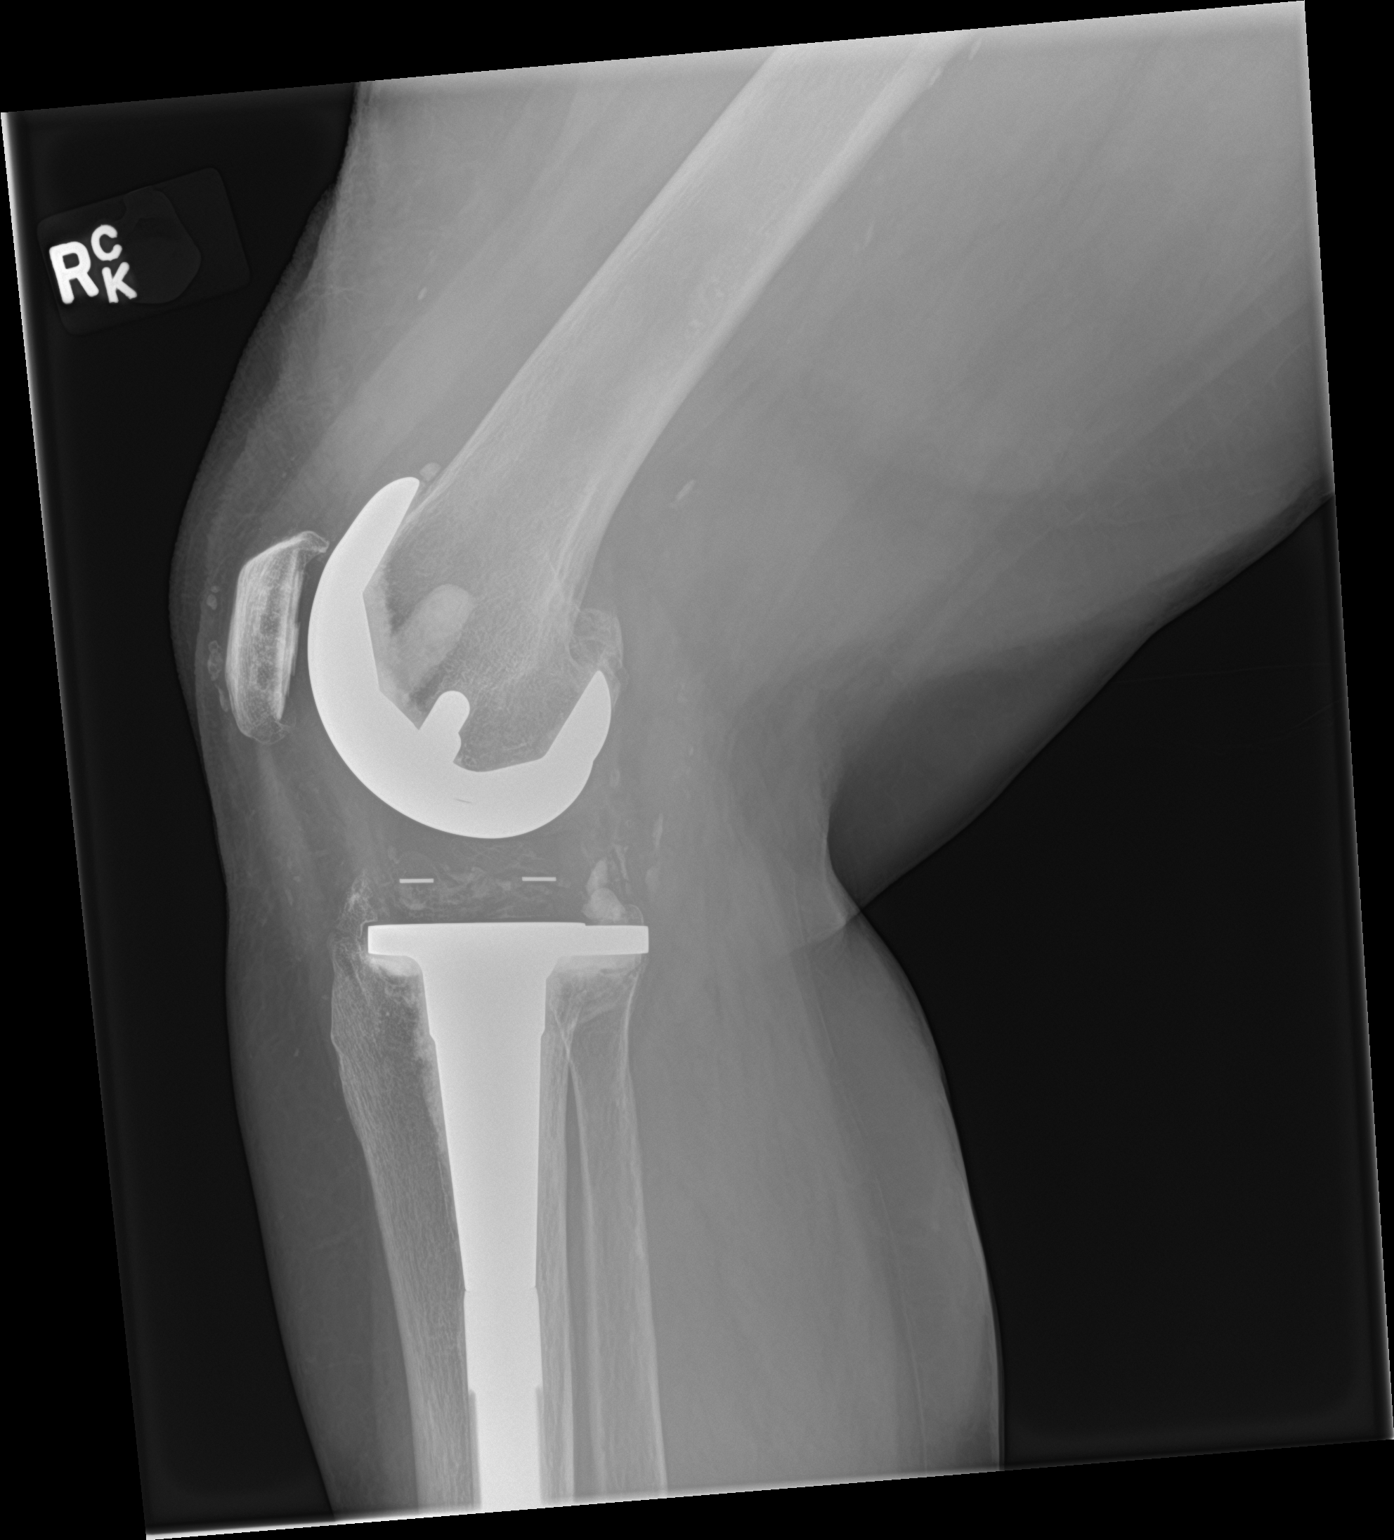

[knee sunrise]
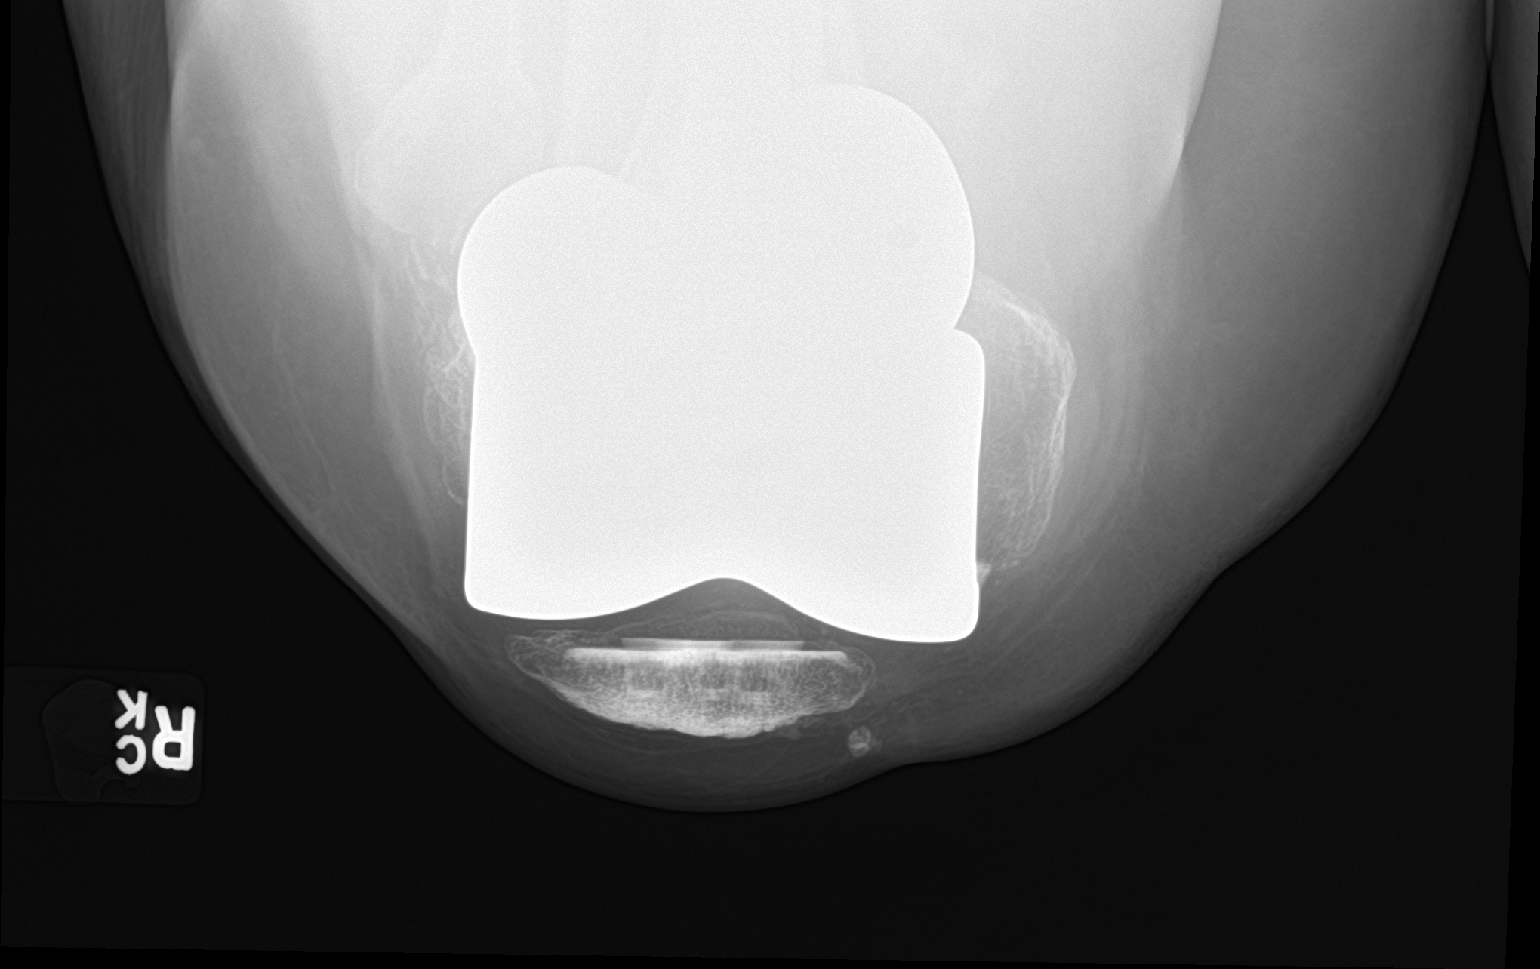

[3 of 3 positions shown; findings below may reference images not displayed]

FINDINGS: RIGHT KNEE: The right knee demonstrates a total knee arthroplasty
without evidence of hardware failure complication. There is no
significant joint effusion. There is no fracture or dislocation. The
alignment is anatomic.

LEFT KNEE: The left knee demonstrates a total knee arthroplasty
without evidence of hardware failure complication. There is no
significant joint effusion. There is no fracture or dislocation. The
alignment is anatomic.
IMPRESSION: 1. Bilateral total knee arthroplasties without failure or
complication.

## 2018-02-21 ENCOUNTER — Ambulatory Visit: Payer: Medicare HMO | Admitting: Family Medicine

## 2018-02-21 DIAGNOSIS — R922 Inconclusive mammogram: Secondary | ICD-10-CM | POA: Diagnosis not present

## 2018-02-21 DIAGNOSIS — N6322 Unspecified lump in the left breast, upper inner quadrant: Secondary | ICD-10-CM | POA: Diagnosis not present

## 2018-02-21 DIAGNOSIS — R928 Other abnormal and inconclusive findings on diagnostic imaging of breast: Secondary | ICD-10-CM | POA: Diagnosis not present

## 2018-02-21 DIAGNOSIS — Z09 Encounter for follow-up examination after completed treatment for conditions other than malignant neoplasm: Secondary | ICD-10-CM | POA: Diagnosis not present

## 2018-02-21 LAB — HM MAMMOGRAPHY

## 2018-02-25 ENCOUNTER — Telehealth: Payer: Self-pay | Admitting: *Deleted

## 2018-02-25 NOTE — Telephone Encounter (Signed)
Received comprehensive chart note from [employer]; forwarded to provider/SLS 07/29

## 2018-02-27 ENCOUNTER — Other Ambulatory Visit: Payer: Self-pay | Admitting: Family Medicine

## 2018-02-27 DIAGNOSIS — N6320 Unspecified lump in the left breast, unspecified quadrant: Secondary | ICD-10-CM

## 2018-02-28 ENCOUNTER — Ambulatory Visit (INDEPENDENT_AMBULATORY_CARE_PROVIDER_SITE_OTHER): Payer: Medicare HMO | Admitting: Family Medicine

## 2018-02-28 ENCOUNTER — Encounter: Payer: Self-pay | Admitting: Family Medicine

## 2018-02-28 VITALS — BP 128/68 | HR 69 | Temp 98.4°F | Resp 18 | Ht 63.0 in | Wt 194.2 lb

## 2018-02-28 DIAGNOSIS — E2839 Other primary ovarian failure: Secondary | ICD-10-CM

## 2018-02-28 DIAGNOSIS — Z7901 Long term (current) use of anticoagulants: Secondary | ICD-10-CM | POA: Diagnosis not present

## 2018-02-28 DIAGNOSIS — I10 Essential (primary) hypertension: Secondary | ICD-10-CM

## 2018-02-28 DIAGNOSIS — N189 Chronic kidney disease, unspecified: Secondary | ICD-10-CM | POA: Diagnosis not present

## 2018-02-28 DIAGNOSIS — E785 Hyperlipidemia, unspecified: Secondary | ICD-10-CM | POA: Diagnosis not present

## 2018-02-28 DIAGNOSIS — M542 Cervicalgia: Secondary | ICD-10-CM

## 2018-02-28 DIAGNOSIS — B372 Candidiasis of skin and nail: Secondary | ICD-10-CM | POA: Insufficient documentation

## 2018-02-28 DIAGNOSIS — E559 Vitamin D deficiency, unspecified: Secondary | ICD-10-CM

## 2018-02-28 DIAGNOSIS — I2782 Chronic pulmonary embolism: Secondary | ICD-10-CM

## 2018-02-28 DIAGNOSIS — E038 Other specified hypothyroidism: Secondary | ICD-10-CM

## 2018-02-28 DIAGNOSIS — E669 Obesity, unspecified: Secondary | ICD-10-CM | POA: Diagnosis not present

## 2018-02-28 DIAGNOSIS — M25552 Pain in left hip: Secondary | ICD-10-CM

## 2018-02-28 DIAGNOSIS — E782 Mixed hyperlipidemia: Secondary | ICD-10-CM

## 2018-02-28 DIAGNOSIS — I509 Heart failure, unspecified: Secondary | ICD-10-CM | POA: Diagnosis not present

## 2018-02-28 DIAGNOSIS — R739 Hyperglycemia, unspecified: Secondary | ICD-10-CM

## 2018-02-28 DIAGNOSIS — M8580 Other specified disorders of bone density and structure, unspecified site: Secondary | ICD-10-CM

## 2018-02-28 HISTORY — DX: Candidiasis of skin and nail: B37.2

## 2018-02-28 MED ORDER — FLUCONAZOLE 150 MG PO TABS: 150.0000 mg | ORAL_TABLET | Freq: Once | ORAL | 1 refills | Status: AC

## 2018-02-28 MED ORDER — NYSTATIN 100000 UNIT/ML MT SUSP: 5.0000 mL | Freq: Four times a day (QID) | OROMUCOSAL | 2 refills | Status: DC

## 2018-02-28 MED ORDER — EZETIMIBE 10 MG PO TABS: 10.0000 mg | ORAL_TABLET | Freq: Every day | ORAL | 1 refills | Status: DC

## 2018-02-28 MED ORDER — GABAPENTIN 100 MG PO CAPS: ORAL_CAPSULE | ORAL | 1 refills | Status: DC

## 2018-02-28 MED ORDER — RANITIDINE HCL 300 MG PO TABS: ORAL_TABLET | ORAL | 1 refills | Status: DC

## 2018-02-28 NOTE — Assessment & Plan Note (Signed)
Well controlled, no changes to meds. Encouraged heart healthy diet such as the DASH diet and exercise as tolerated.  °

## 2018-02-28 NOTE — Assessment & Plan Note (Signed)
Encouraged DASH diet, decrease po intake and increase exercise as tolerated. Needs 7-8 hours of sleep nightly. Avoid trans fats, eat small, frequent meals every 4-5 hours with lean proteins, complex carbs and healthy fats. Minimize simple carbs 

## 2018-02-28 NOTE — Assessment & Plan Note (Signed)
Check cmp 

## 2018-02-28 NOTE — Assessment & Plan Note (Signed)
Use Diflucan 150 mg tabs, 1 tab po weekly x 2 weeks. Nystatin cream twice daily

## 2018-02-28 NOTE — Assessment & Plan Note (Signed)
On Levothyroxine, continue to monitor 

## 2018-02-28 NOTE — Assessment & Plan Note (Signed)
Encouraged heart healthy diet, increase exercise, avoid trans fats, consider a krill oil cap daily 

## 2018-02-28 NOTE — Assessment & Plan Note (Signed)
No recent exacerbation 

## 2018-02-28 NOTE — Patient Instructions (Signed)

## 2018-03-01 ENCOUNTER — Encounter: Payer: Self-pay | Admitting: Family Medicine

## 2018-03-01 LAB — CBC
HCT: 38.4 % (ref 36.0–46.0)
HEMOGLOBIN: 12.7 g/dL (ref 12.0–15.0)
MCHC: 33 g/dL (ref 30.0–36.0)
MCV: 89.9 fl (ref 78.0–100.0)
PLATELETS: 260 10*3/uL (ref 150.0–400.0)
RBC: 4.27 Mil/uL (ref 3.87–5.11)
RDW: 14.7 % (ref 11.5–15.5)
WBC: 7.8 10*3/uL (ref 4.0–10.5)

## 2018-03-01 LAB — TSH: TSH: 0.47 u[IU]/mL (ref 0.35–4.50)

## 2018-03-01 LAB — PROTIME-INR
INR: 3 ratio — ABNORMAL HIGH (ref 0.8–1.0)
PROTHROMBIN TIME: 34.8 s — AB (ref 9.6–13.1)

## 2018-03-01 LAB — COMPREHENSIVE METABOLIC PANEL
ALT: 11 U/L (ref 0–35)
AST: 16 U/L (ref 0–37)
Albumin: 4.4 g/dL (ref 3.5–5.2)
Alkaline Phosphatase: 35 U/L — ABNORMAL LOW (ref 39–117)
BUN: 35 mg/dL — ABNORMAL HIGH (ref 6–23)
CALCIUM: 10.1 mg/dL (ref 8.4–10.5)
CHLORIDE: 104 meq/L (ref 96–112)
CO2: 24 meq/L (ref 19–32)
Creatinine, Ser: 2.63 mg/dL — ABNORMAL HIGH (ref 0.40–1.20)
GFR: 19.09 mL/min — AB (ref >60.00)
Glucose, Bld: 88 mg/dL (ref 70–99)
Potassium: 5.1 mEq/L (ref 3.5–5.1)
Sodium: 140 mEq/L (ref 135–145)
Total Bilirubin: 0.4 mg/dL (ref 0.2–1.2)
Total Protein: 6.9 g/dL (ref 6.0–8.3)

## 2018-03-01 LAB — LIPID PANEL
CHOL/HDL RATIO: 4
Cholesterol: 220 mg/dL — ABNORMAL HIGH (ref 0–200)
HDL: 53.6 mg/dL (ref >39.00)
NONHDL: 165.99
TRIGLYCERIDES: 250 mg/dL — AB (ref 0.0–149.0)
VLDL: 50 mg/dL — AB (ref 0.0–40.0)

## 2018-03-01 LAB — LDL CHOLESTEROL, DIRECT: LDL DIRECT: 129 mg/dL

## 2018-03-01 LAB — HEMOGLOBIN A1C: Hgb A1c MFr Bld: 5.5 % (ref 4.6–6.5)

## 2018-03-01 LAB — VITAMIN D 25 HYDROXY (VIT D DEFICIENCY, FRACTURES): VITD: 43.75 ng/mL (ref 30.00–100.00)

## 2018-03-03 DIAGNOSIS — E559 Vitamin D deficiency, unspecified: Secondary | ICD-10-CM

## 2018-03-03 HISTORY — DX: Vitamin D deficiency, unspecified: E55.9

## 2018-03-03 NOTE — Assessment & Plan Note (Signed)
She is tolerating the Coumadin but will discuss with patient the possiblity of discontinuing the Coumadin due to distant histoy of DVT

## 2018-03-03 NOTE — Assessment & Plan Note (Signed)
hgba1c acceptable, minimize simple carbs. Increase exercise as tolerated.  

## 2018-03-03 NOTE — Assessment & Plan Note (Signed)
Monitor and supplement 

## 2018-03-03 NOTE — Progress Notes (Signed)
Subjective:    Patient ID: Emily Mitchell, female    DOB: Apr 12, 1946, 72 y.o.   MRN: [id number]  No chief complaint on file.   HPI Patient is in today for follow-up.  She feels well today.  No recent febrile illness or hospitalizations.  No polyuria or polydipsia.  Is trying to maintain a heart healthy diet. Denies CP/palp/HA/congestion/fevers/GI or GU c/o. Taking meds as prescribed  Past Medical History:  Diagnosis Date  . Anemia 07/05/2014  . Anxiety and depression 12/14/2008   Qualifier: Diagnosis of  By: [name] LPN, Almyra Free    . Arthritis   . Asthma   . Atypical chest pain 05/23/2015  . Carotid artery occlusion   . CHF (congestive heart failure) ([place])   . Chicken pox as a child  . Chronic kidney disease    Bright's Disease at age 28   . COPD (chronic obstructive pulmonary disease) ([place])   . GERD (gastroesophageal reflux disease)   . H. pylori infection 10/19/2013  . Hyperglycemia 03/14/2016  . Hyperlipidemia   . Hypertension   . Hypothyroidism   . Knee pain, bilateral 12/17/2012  . Left hip pain 05/23/2015  . Low back pain 09/19/2015  . Macular degeneration of both eyes 12/16/2015   Right eye is wet Left Eye is dry Shots every 11 to 12 weeks in right eye at opthamologist office  . Medicare annual wellness visit, subsequent 02/21/2015  . Mumps 72 yrs old  . Preventative health care 03/14/2016  . Pulmonary emboli ([place]) 05/26/2013  . Tremor   . UTI (lower urinary tract infection) 10/19/2013    Past Surgical History:  Procedure Laterality Date  . ABDOMINAL HYSTERECTOMY  1984  . CAROTID ENDARTERECTOMY Right 05/10/07   cea  . CATARACT EXTRACTION Left   . EYE SURGERY     b/l  . knees replaced  2005 and 2011   both knees  . WISDOM TOOTH EXTRACTION      Family History  Problem Relation Age of Onset  . Stroke Mother        mini stroke  . Kidney disease Mother   . Heart failure Mother   . Hypertension Mother   . Diabetes Sister        type 2  . Kidney disease Sister     . Heart attack Maternal Grandfather   . Hyperlipidemia Sister     Social History   Socioeconomic History  . Marital status: Divorced    Spouse name: Not on file  . Number of children: 2  . Years of education: Not on file  . Highest education level: Not on file  Occupational History  . Not on file  Social Needs  . Financial resource strain: Not on file  . Food insecurity:    Worry: Not on file    Inability: Not on file  . Transportation needs:    Medical: Not on file    Non-medical: Not on file  Tobacco Use  . Smoking status: Former Smoker    Packs/day: 2.00    Years: 30.00    Pack years: 60.00    Types: Cigarettes    Last attempt to quit: 12/03/1994    Years since quitting: 23.2  . Smokeless tobacco: Never Used  Substance and Sexual Activity  . Alcohol use: Yes    Comment: rarely, 1/month  . Drug use: No  . Sexual activity: Not Currently    Comment: lives with son and friend, no dietary restrictions  Lifestyle  .  Physical activity:    Days per week: Not on file    Minutes per session: Not on file  . Stress: Not on file  Relationships  . Social connections:    Talks on phone: Not on file    Gets together: Not on file    Attends religious service: Not on file    Active member of club or organization: Not on file    Attends meetings of clubs or organizations: Not on file    Relationship status: Not on file  . Intimate partner violence:    Fear of current or ex partner: Not on file    Emotionally abused: Not on file    Physically abused: Not on file    Forced sexual activity: Not on file  Other Topics Concern  . Not on file  Social History Narrative   epworth sleepiness scale = 11 (06/15/2015)    Outpatient Medications Prior to Visit  Medication Sig Dispense Refill  . acetaminophen (TYLENOL) 500 MG tablet Take 500 mg by mouth every 6 (six) hours as needed.    . budesonide-formoterol (SYMBICORT) 80-4.5 MCG/ACT inhaler Inhale 2 puffs into the lungs 2 (two)  times daily. 1 Inhaler 5  . calcium citrate-vitamin D (CITRACAL+D) 315-200 MG-UNIT per tablet Take 1 tablet by mouth daily.     . cetirizine (ZYRTEC) 10 MG tablet Take 1 tablet (10 mg total) by mouth daily as needed for allergies. 30 tablet 11  . Cholecalciferol (D3 MAXIMUM STRENGTH) 5000 UNITS capsule Take 5,000 Units by mouth daily.    . Choline Fenofibrate (FENOFIBRIC ACID) 135 MG CPDR TAKE 1 CAPSULE BY MOUTH ONCE DAILY 90 capsule 1  . citalopram (CELEXA) 20 MG tablet Take 2 tablets (40 mg total) by mouth daily. 180 tablet 1  . clonazePAM (KLONOPIN) 1 MG tablet TAKE 1 TABLET BY MOUTH THREE TIMES DAILY AS NEEDED FOR ANXIETY 90 tablet 2  . Cyanocobalamin (VITAMIN B-12 PO) Take by mouth.    . diclofenac sodium (VOLTAREN) 1 % GEL Apply 4 g topically 4 (four) times daily. 5 Tube 1  . esomeprazole (NEXIUM) 40 MG capsule TAKE 1 CAPSULE BY MOUTH ONCE DAILY AT  12  NOON 90 capsule 1  . FISH OIL-KRILL OIL PO Take by mouth.    . furosemide (LASIX) 20 MG tablet Take 1 tablet (20 mg total) by mouth daily as needed for edema. 30 tablet 3  . levothyroxine (SYNTHROID, LEVOTHROID) 125 MCG tablet TAKE 1 TABLET BY MOUTH ONCE DAILY BEFORE BREAKFAST 90 tablet 1  . losartan (COZAAR) 25 MG tablet TAKE 1 TABLET BY MOUTH ONCE DAILY 90 tablet 1  . montelukast (SINGULAIR) 10 MG tablet TAKE 1 TABLET BY MOUTH AT BEDTIME AS NEEDED 65 tablet 1  . nitroGLYCERIN (NITROSTAT) 0.4 MG SL tablet Place 1 tablet (0.4 mg total) under the tongue every 5 (five) minutes as needed for chest pain. 25 tablet 3  . omeprazole (PRILOSEC) 20 MG capsule Take 1 capsule (20 mg total) by mouth 2 (two) times daily before a meal. 28 capsule 0  . PT/INR Test STRP Check INR every 4 weeks,  PRN Dz:I26.99 48 each 0  . PT/INR Testing Monitor KIT Check INR every 4 weeks prn Dz: l26.99 1 each 0  . warfarin (COUMADIN) 5 MG tablet TAKE AS DIRECTED (Patient taking differently: TAKE AS DIRECTED-M-W-F take half pill other days take whole) 90 tablet 1  .  albuterol (PROVENTIL HFA;VENTOLIN HFA) 108 (90 Base) MCG/ACT inhaler Inhale 2 puffs into the lungs every  6 (six) hours as needed for wheezing or shortness of breath. 1 Inhaler 5  . budesonide-formoterol (SYMBICORT) 80-4.5 MCG/ACT inhaler Inhale 2 puffs into the lungs 2 (two) times daily. 1 Inhaler 0  . budesonide-formoterol (SYMBICORT) 80-4.5 MCG/ACT inhaler Inhale 2 puffs into the lungs 2 (two) times daily. 1 Inhaler 0  . ezetimibe (ZETIA) 10 MG tablet TAKE 1 TABLET BY MOUTH ONCE DAILY 90 tablet 1  . gabapentin (NEURONTIN) 100 MG capsule TAKE 1 TO 2 CAPSULES BY MOUTH AT BEDTIME 180 capsule 1  . ranitidine (ZANTAC) 300 MG tablet TAKE 1 TABLET BY MOUTH ONCE DAILY WITH BREAKFAST 90 tablet 1   No facility-administered medications prior to visit.     Allergies  Allergen Reactions  . Niaspan [Niacin Er] Nausea And Vomiting and Swelling    Swelling in mouth  . Pantoprazole     Mouth sores  . Bupropion Other (See Comments)    Uncontrollable shakes  . Penicillins Hives  . Statins   . Sulfonamide Derivatives Hives  . Apple Rash  . Banana Rash  . Milk-Related Compounds Rash    Review of Systems  Constitutional: Positive for malaise/fatigue. Negative for fever.  HENT: Negative for congestion.   Eyes: Negative for blurred vision.  Respiratory: Negative for shortness of breath.   Cardiovascular: Negative for chest pain, palpitations and leg swelling.  Gastrointestinal: Negative for abdominal pain, blood in stool and nausea.  Genitourinary: Negative for dysuria and frequency.  Musculoskeletal: Negative for falls.  Skin: Positive for itching and rash.  Neurological: Negative for dizziness, loss of consciousness and headaches.  Endo/Heme/Allergies: Negative for environmental allergies.  Psychiatric/Behavioral: Negative for depression. The patient is not nervous/anxious.        Objective:    Physical Exam  Constitutional: She is oriented to person, place, and time. She appears  well-developed and well-nourished. No distress.  HENT:  Head: Normocephalic and atraumatic.  Nose: Nose normal.  Eyes: Right eye exhibits no discharge. Left eye exhibits no discharge.  Neck: Normal range of motion. Neck supple.  Cardiovascular: Normal rate and regular rhythm.  No murmur heard. Pulmonary/Chest: Effort normal and breath sounds normal.  Abdominal: Soft. Bowel sounds are normal. There is no tenderness.  Musculoskeletal: She exhibits no edema.  Neurological: She is alert and oriented to person, place, and time.  Skin: Skin is warm and dry.  Psychiatric: She has a normal mood and affect.  Nursing note and vitals reviewed.   BP 128/68 (BP Location: Left Arm, Patient Position: Sitting, Cuff Size: Normal)   Pulse 69   Temp 98.4 F (36.9 C) (Oral)   Resp 18   Ht '5\' 3"'  (1.6 m)   Wt 194 lb 3.2 oz (88.1 kg)   SpO2 97%   BMI 34.40 kg/m  Wt Readings from Last 3 Encounters:  02/28/18 194 lb 3.2 oz (88.1 kg)  12/07/17 189 lb 12.8 oz (86.1 kg)  11/06/17 186 lb 8 oz (84.6 kg)     Lab Results  Component Value Date   WBC 7.8 02/28/2018   HGB 12.7 02/28/2018   HCT 38.4 02/28/2018   PLT 260.0 02/28/2018   GLUCOSE 88 02/28/2018   CHOL 220 (H) 02/28/2018   TRIG 250.0 (H) 02/28/2018   HDL 53.60 02/28/2018   LDLDIRECT 129.0 02/28/2018   LDLCALC 101 (H) 09/14/2016   ALT 11 02/28/2018   AST 16 02/28/2018   NA 140 02/28/2018   K 5.1 02/28/2018   CL 104 02/28/2018   CREATININE 2.63 (H) 02/28/2018  BUN 35 (H) 02/28/2018   CO2 24 02/28/2018   TSH 0.47 02/28/2018   INR 3.0 (H) 02/28/2018   HGBA1C 5.5 02/28/2018    Lab Results  Component Value Date   TSH 0.47 02/28/2018   Lab Results  Component Value Date   WBC 7.8 02/28/2018   HGB 12.7 02/28/2018   HCT 38.4 02/28/2018   MCV 89.9 02/28/2018   PLT 260.0 02/28/2018   Lab Results  Component Value Date   NA 140 02/28/2018   K 5.1 02/28/2018   CO2 24 02/28/2018   GLUCOSE 88 02/28/2018   BUN 35 (H) 02/28/2018    CREATININE 2.63 (H) 02/28/2018   BILITOT 0.4 02/28/2018   ALKPHOS 35 (L) 02/28/2018   AST 16 02/28/2018   ALT 11 02/28/2018   PROT 6.9 02/28/2018   ALBUMIN 4.4 02/28/2018   CALCIUM 10.1 02/28/2018   ANIONGAP 9 02/03/2017   GFR 19.09 (L) 02/28/2018   Lab Results  Component Value Date   CHOL 220 (H) 02/28/2018   Lab Results  Component Value Date   HDL 53.60 02/28/2018   Lab Results  Component Value Date   LDLCALC 101 (H) 09/14/2016   Lab Results  Component Value Date   TRIG 250.0 (H) 02/28/2018   Lab Results  Component Value Date   CHOLHDL 4 02/28/2018   Lab Results  Component Value Date   HGBA1C 5.5 02/28/2018       Assessment & Plan:   Problem List Items Addressed This Visit    Chronic kidney disease    Check cmp      Hypothyroidism    On Levothyroxine, continue to monitor      CHF (congestive heart failure) ([facility])    No recent exacerbation      Relevant Medications   ezetimibe (ZETIA) 10 MG tablet   Hyperlipidemia, mixed    Encouraged heart healthy diet, increase exercise, avoid trans fats, consider a krill oil cap daily      Relevant Medications   ezetimibe (ZETIA) 10 MG tablet   Pulmonary emboli ([place])    She is tolerating the Coumadin but will discuss with patient the possiblity of discontinuing the Coumadin due to distant histoy of DVT      Relevant Medications   ezetimibe (ZETIA) 10 MG tablet   Left hip pain   Essential hypertension    Well controlled, no changes to meds. Encouraged heart healthy diet such as the DASH diet and exercise as tolerated.       Relevant Medications   ezetimibe (ZETIA) 10 MG tablet   Other Relevant Orders   CBC (Completed)   Comprehensive metabolic panel (Completed)   TSH (Completed)   Hyperglycemia    hgba1c acceptable, minimize simple carbs. Increase exercise as tolerated.       Relevant Orders   Hemoglobin A1c (Completed)   Neck pain   Candidal skin infection    Use Diflucan 150 mg tabs, 1 tab po  weekly x 2 weeks. Nystatin cream twice daily       Relevant Medications   nystatin (MYCOSTATIN) 100000 UNIT/ML suspension   Obesity    Encouraged DASH diet, decrease po intake and increase exercise as tolerated. Needs 7-8 hours of sleep nightly. Avoid trans fats, eat small, frequent meals every 4-5 hours with lean proteins, complex carbs and healthy fats. Minimize simple carbs      Vitamin D deficiency    Monitor and supplement      Relevant Orders   VITAMIN D 25  Hydroxy (Vit-D Deficiency, Fractures) (Completed)    Other Visit Diagnoses    Estrogen deficiency    -  Primary   Relevant Orders   DG Bone Density   Hyperlipidemia, unspecified hyperlipidemia type       Relevant Medications   ezetimibe (ZETIA) 10 MG tablet   Other Relevant Orders   Lipid panel (Completed)   Long term (current) use of anticoagulants       Osteopenia, unspecified location       Relevant Orders   DG Bone Density   Anticoagulated on Coumadin       Relevant Orders   Protime-INR (Completed)      I have discontinued Pearline Cables. [place]'s albuterol. I have also changed her ezetimibe. Additionally, I am having her start on fluconazole and nystatin. Lastly, I am having her maintain her calcium citrate-vitamin D, Cholecalciferol, nitroGLYCERIN, Cyanocobalamin (VITAMIN B-12 PO), acetaminophen, furosemide, FISH OIL-KRILL OIL PO, cetirizine, omeprazole, PT/INR Testing Monitor, PT/INR Test, diclofenac sodium, losartan, esomeprazole, Fenofibric Acid, levothyroxine, citalopram, warfarin, montelukast, budesonide-formoterol, clonazePAM, gabapentin, and ranitidine.  Meds ordered this encounter  Medications  . gabapentin (NEURONTIN) 100 MG capsule    Sig: TAKE 1 TO 2 CAPSULES BY MOUTH AT BEDTIME    Dispense:  180 capsule    Refill:  1    Please consider 90 day supplies to promote better adherence  . ezetimibe (ZETIA) 10 MG tablet    Sig: Take 1 tablet (10 mg total) by mouth daily.    Dispense:  90 tablet    Refill:   1  . ranitidine (ZANTAC) 300 MG tablet    Sig: TAKE 1 TABLET BY MOUTH ONCE DAILY WITH BREAKFAST    Dispense:  90 tablet    Refill:  1  . fluconazole (DIFLUCAN) 150 MG tablet    Sig: Take 1 tablet (150 mg total) by mouth once for 1 dose.    Dispense:  2 tablet    Refill:  1  . nystatin (MYCOSTATIN) 100000 UNIT/ML suspension    Sig: Take 5 mLs (500,000 Units total) by mouth 4 (four) times daily.    Dispense:  473 mL    Refill:  2     Penni Homans, MD

## 2018-03-04 ENCOUNTER — Inpatient Hospital Stay (HOSPITAL_BASED_OUTPATIENT_CLINIC_OR_DEPARTMENT_OTHER): Admission: RE | Admit: 2018-03-04 | Payer: Medicare HMO | Source: Ambulatory Visit

## 2018-03-04 DIAGNOSIS — D631 Anemia in chronic kidney disease: Secondary | ICD-10-CM | POA: Diagnosis not present

## 2018-03-04 DIAGNOSIS — N183 Chronic kidney disease, stage 3 (moderate): Secondary | ICD-10-CM | POA: Diagnosis not present

## 2018-03-08 ENCOUNTER — Other Ambulatory Visit (HOSPITAL_BASED_OUTPATIENT_CLINIC_OR_DEPARTMENT_OTHER): Payer: Medicare HMO

## 2018-03-13 ENCOUNTER — Encounter: Payer: Self-pay | Admitting: Pulmonary Disease

## 2018-03-13 ENCOUNTER — Ambulatory Visit: Payer: Medicare HMO | Admitting: Pulmonary Disease

## 2018-03-13 DIAGNOSIS — J449 Chronic obstructive pulmonary disease, unspecified: Secondary | ICD-10-CM | POA: Diagnosis not present

## 2018-03-13 DIAGNOSIS — I2699 Other pulmonary embolism without acute cor pulmonale: Secondary | ICD-10-CM

## 2018-03-13 DIAGNOSIS — G4733 Obstructive sleep apnea (adult) (pediatric): Secondary | ICD-10-CM | POA: Diagnosis not present

## 2018-03-13 MED ORDER — BUDESONIDE-FORMOTEROL FUMARATE 80-4.5 MCG/ACT IN AERO: 2.0000 | INHALATION_SPRAY | Freq: Two times a day (BID) | RESPIRATORY_TRACT | 5 refills | Status: DC

## 2018-03-13 NOTE — Patient Instructions (Signed)
5 refills on Symbicort. Discussed with Dr. Randel Pigg about stopping Coumadin

## 2018-03-13 NOTE — Progress Notes (Signed)
   Subjective:    Patient ID: Emily Mitchell, female    DOB: [birth year], 72 y.o.   MRN: [id number]  HPI  72 yo former smoker followed for COPD with asthma She was diagnosed with PE in 01/2013 at [facility]-maintained on Coumadin since then  OSA was very mild on testing in 2015 and she lost significant weight and came off CPAP.  Symbicort really helped and she has rarely needed to use albuterol.  She continues on Coumadin her INR has been in 3.0 range  Significant tests/ events  PSG at [facility] 08/2011 -217 lbs -AHI 4/h, TST 319 mins,supine - and Dr Jarome Lamas said she did not need CPAP. Dr Luan Pulling said she did    Spirometry 04/2013: FEV1 72% predicted FEV1 FVC ratio 73 Spirometry  10/2017  severe airway obstruction with ratio 60, FEV1 of 41% and FVC of 52%   PSG 07/2013 -208 lbs -showed mild OSA TST - 390 mins, AHI 6/h, RDI of 8/h The lowest desaturation was 87%   Echo 08/2014 grade 2 DD  12/2014 ONO - no desatn >> dc O2  Review of Systems Patient denies significant dyspnea,cough, hemoptysis,  chest pain, palpitations, pedal edema, orthopnea, paroxysmal nocturnal dyspnea, lightheadedness, nausea, vomiting, abdominal or  leg pains      Objective:   Physical Exam   Gen. Pleasant, well-nourished, in no distress ENT - no thrush, no post nasal drip Neck: No JVD, no thyromegaly, no carotid bruits Lungs: no use of accessory muscles, no dullness to percussion, clear without rales or rhonchi  Cardiovascular: Rhythm regular, heart sounds  normal, no murmurs or gallops, no peripheral edema Musculoskeletal: No deformities, no cyanosis or clubbing         Assessment & Plan:

## 2018-03-13 NOTE — Assessment & Plan Note (Signed)
Symbicort is really helped her to the point that she does not need for rescue albuterol at all. Refills will be provided

## 2018-03-13 NOTE — Assessment & Plan Note (Signed)
She has been on Coumadin now for 5 years with no recurrence.  She only had one unprovoked event. Okay to stop Coumadin for now.

## 2018-03-13 NOTE — Addendum Note (Signed)
Addended by: Valerie Salts on: 03/13/2018 02:09 PM   Modules accepted: Orders

## 2018-03-13 NOTE — Assessment & Plan Note (Signed)
Okay to stay off CPAP, she has lost significant weight and had only mild OSA to begin with

## 2018-03-14 ENCOUNTER — Ambulatory Visit (HOSPITAL_BASED_OUTPATIENT_CLINIC_OR_DEPARTMENT_OTHER)
Admission: RE | Admit: 2018-03-14 | Discharge: 2018-03-14 | Disposition: A | Discharge status: Home / Self Care | Payer: Medicare HMO | Source: Ambulatory Visit | Attending: Family Medicine | Admitting: Family Medicine

## 2018-03-14 ENCOUNTER — Encounter (HOSPITAL_BASED_OUTPATIENT_CLINIC_OR_DEPARTMENT_OTHER): Payer: Self-pay | Admitting: Radiology

## 2018-03-14 DIAGNOSIS — Z780 Asymptomatic menopausal state: Secondary | ICD-10-CM | POA: Diagnosis not present

## 2018-03-14 DIAGNOSIS — M8580 Other specified disorders of bone density and structure, unspecified site: Secondary | ICD-10-CM

## 2018-03-14 DIAGNOSIS — E2839 Other primary ovarian failure: Secondary | ICD-10-CM | POA: Diagnosis not present

## 2018-03-14 DIAGNOSIS — M8589 Other specified disorders of bone density and structure, multiple sites: Secondary | ICD-10-CM | POA: Diagnosis not present

## 2018-03-14 DIAGNOSIS — M8588 Other specified disorders of bone density and structure, other site: Secondary | ICD-10-CM | POA: Insufficient documentation

## 2018-03-18 ENCOUNTER — Other Ambulatory Visit: Payer: Self-pay | Admitting: Family Medicine

## 2018-03-18 DIAGNOSIS — K219 Gastro-esophageal reflux disease without esophagitis: Secondary | ICD-10-CM

## 2018-03-19 NOTE — Telephone Encounter (Signed)
Received refill request on Choline Fenofibrate and esomeprazole.   Last OV:  02/28/2018  Last Refill:  08/24/17  Refills sent to pt's pharmacy.

## 2018-03-21 DIAGNOSIS — I509 Heart failure, unspecified: Secondary | ICD-10-CM | POA: Diagnosis not present

## 2018-03-21 DIAGNOSIS — E785 Hyperlipidemia, unspecified: Secondary | ICD-10-CM | POA: Diagnosis not present

## 2018-03-21 DIAGNOSIS — D631 Anemia in chronic kidney disease: Secondary | ICD-10-CM | POA: Diagnosis not present

## 2018-03-21 DIAGNOSIS — I129 Hypertensive chronic kidney disease with stage 1 through stage 4 chronic kidney disease, or unspecified chronic kidney disease: Secondary | ICD-10-CM | POA: Diagnosis not present

## 2018-03-21 DIAGNOSIS — E669 Obesity, unspecified: Secondary | ICD-10-CM | POA: Diagnosis not present

## 2018-03-21 DIAGNOSIS — I2699 Other pulmonary embolism without acute cor pulmonale: Secondary | ICD-10-CM | POA: Diagnosis not present

## 2018-03-21 DIAGNOSIS — N2581 Secondary hyperparathyroidism of renal origin: Secondary | ICD-10-CM | POA: Diagnosis not present

## 2018-03-21 DIAGNOSIS — N183 Chronic kidney disease, stage 3 (moderate): Secondary | ICD-10-CM | POA: Diagnosis not present

## 2018-03-21 DIAGNOSIS — N179 Acute kidney failure, unspecified: Secondary | ICD-10-CM | POA: Diagnosis not present

## 2018-03-26 DIAGNOSIS — H353211 Exudative age-related macular degeneration, right eye, with active choroidal neovascularization: Secondary | ICD-10-CM | POA: Diagnosis not present

## 2018-03-26 DIAGNOSIS — H353121 Nonexudative age-related macular degeneration, left eye, early dry stage: Secondary | ICD-10-CM | POA: Diagnosis not present

## 2018-04-04 ENCOUNTER — Ambulatory Visit (INDEPENDENT_AMBULATORY_CARE_PROVIDER_SITE_OTHER): Payer: Medicare HMO | Admitting: Family Medicine

## 2018-04-04 DIAGNOSIS — I10 Essential (primary) hypertension: Secondary | ICD-10-CM | POA: Diagnosis not present

## 2018-04-04 DIAGNOSIS — Z7901 Long term (current) use of anticoagulants: Secondary | ICD-10-CM

## 2018-04-04 LAB — POCT INR: INR: 3 (ref 2.0–3.0)

## 2018-04-04 NOTE — Progress Notes (Signed)
Nursing note reviewed. Agree with documention and plan.  

## 2018-04-04 NOTE — Progress Notes (Signed)
Pre visit review using our clinic tool,if applicable. No additional management support is needed unless otherwise documented below in the visit note.   Pt here for INR check per  Goal INR = 2.0-3.0  Last INR =2.8  Pt currently takes Coumadin   Pt denies recent antibiotics, no dietary changes and no unusual bruising / bleeding.  INR today = 3.0   Patient had Losartan discontinued as well. No other BP medication started.  BP today = 102/67  P= 74   Pt advised per Dr. Frederik Pear patient to continue Coumadin as ordered and return in 1 month for INR check. For BP continue as ordered and hydrate well. Patient agreed appointment scheduled.

## 2018-04-11 NOTE — Progress Notes (Signed)
Nursing note reviewed. Agree with documention and plan.  

## 2018-04-11 NOTE — Addendum Note (Signed)
Addended by: Bunnie Domino on: 04/11/2018 08:22 AM   Modules accepted: Orders

## 2018-05-02 ENCOUNTER — Ambulatory Visit (INDEPENDENT_AMBULATORY_CARE_PROVIDER_SITE_OTHER): Payer: Medicare HMO

## 2018-05-02 DIAGNOSIS — Z7901 Long term (current) use of anticoagulants: Secondary | ICD-10-CM | POA: Diagnosis not present

## 2018-05-02 DIAGNOSIS — Z23 Encounter for immunization: Secondary | ICD-10-CM | POA: Diagnosis not present

## 2018-05-02 LAB — POCT INR: INR: 2.4 (ref 2.0–3.0)

## 2018-05-02 NOTE — Progress Notes (Signed)
Pre visit review using our clinic tool,if applicable. No additional management support is needed unless otherwise documented below in the visit note.   Pt here for INR check per  Goal INR =2.0-3.0  Last INR = 3.0  Pt currently takes Coumadin  2.5 on Monn,Wed,and Friday then 5 mg Tue,Thurs,Sat,and Sun.  Pt denies recent antibiotics, no dietary changes and no unusual bruising / bleeding.  INR today = 2.4  Pt advised per  Dr. Lorelei Pont continue taking Coumadin as ordered and return in 1 month for INR check.

## 2018-05-06 DIAGNOSIS — D631 Anemia in chronic kidney disease: Secondary | ICD-10-CM | POA: Diagnosis not present

## 2018-05-06 DIAGNOSIS — N183 Chronic kidney disease, stage 3 (moderate): Secondary | ICD-10-CM | POA: Diagnosis not present

## 2018-05-11 ENCOUNTER — Other Ambulatory Visit: Payer: Self-pay | Admitting: Family Medicine

## 2018-05-21 ENCOUNTER — Other Ambulatory Visit: Payer: Self-pay | Admitting: Family Medicine

## 2018-06-04 ENCOUNTER — Ambulatory Visit (INDEPENDENT_AMBULATORY_CARE_PROVIDER_SITE_OTHER): Payer: Medicare HMO

## 2018-06-04 DIAGNOSIS — Z7901 Long term (current) use of anticoagulants: Secondary | ICD-10-CM

## 2018-06-04 LAB — POCT INR: INR: 3.3 — AB (ref 2.0–3.0)

## 2018-06-04 NOTE — Progress Notes (Signed)
Pre visit review using our clinic tool,if applicable. No additional management support is needed unless otherwise documented below in the visit note.   Pt here for INR check per order from Dr.   Barrie Folk INR = 2.0-3.0  Last INR = 2.4  Pt currently takes Coumadin 2.5 mg on Mon,Wed,Fri and Coumadin 5 mg on Tue,Thurs,Sat,Sun.  Pt denies recent antibiotics, no dietary changes and no unusual bruising / bleeding.  INR today = 3.3  Pt advised per Dr. Charlett Blake to continue same dose of Coumadin and return in 2 weeks for INR check. Patient agreed.

## 2018-06-14 ENCOUNTER — Ambulatory Visit: Payer: Medicare HMO | Admitting: Adult Health

## 2018-06-14 ENCOUNTER — Encounter: Payer: Self-pay | Admitting: Adult Health

## 2018-06-14 ENCOUNTER — Ambulatory Visit (INDEPENDENT_AMBULATORY_CARE_PROVIDER_SITE_OTHER): Payer: Medicare HMO | Admitting: Adult Health

## 2018-06-14 DIAGNOSIS — J310 Chronic rhinitis: Secondary | ICD-10-CM

## 2018-06-14 DIAGNOSIS — J449 Chronic obstructive pulmonary disease, unspecified: Secondary | ICD-10-CM | POA: Diagnosis not present

## 2018-06-14 HISTORY — DX: Chronic rhinitis: J31.0

## 2018-06-14 NOTE — Progress Notes (Signed)
'@Patient'  ID: Emily Mitchell, female    DOB: [birth year], 72 y.o.   MRN: [id number]  Chief Complaint  Patient presents with  . Follow-up    COPD     Referring provider: Mosie Lukes, MD  HPI:   72 yo former smoker followed for COPD with asthma She was diagnosed with PE in 01/2013 at [facility]-maintained on Coumadin since then  OSA was very mild on testing in 2015 and she lost significant weight and came off CPAP.  Symbicort really helped and she has rarely needed to use albuterol.  She continues on Coumadin her INR has been in 3.0 range  Significant tests/ events  PSG at [facility] 08/2011 -217 lbs -AHI 4/h, TST 319 mins,supine - and Dr Jarome Lamas said she did not need CPAP. Dr Luan Pulling said she did    Spirometry 04/2013: FEV1 72% predicted FEV1 FVC ratio 73 Spirometry  10/2017  severe airway obstruction with ratio 60, FEV1 of 41% and FVC of 52%   PSG 07/2013 -208 lbs -showed mild OSA TST - 390 mins, AHI 6/h, RDI of 8/h The lowest desaturation was 87%   Echo 08/2014 grade 2 DD  12/2014 ONO - no desatn >> dc O2  06/14/2018  - Follow up : COPD , Asthma  Presents for a 10-monthfollow-up.  She has underlying mild COPD with asthma component.  Says overall she is doing very well.  She has had no flare of cough or wheezing.  Has rare use of her albuterol.  Is very excited that she join the [employer] is going 3 days a week.  She is very excited with her progress..She is on Symbicort twice daily.  sHe has chronic rhinitis on Singulair and Zyrtec.  Has mild OSA , improved after weight loss. No significant daytime sleepiness.  No longer using CPAP.  Flu shot is utd.     FENO:  No results found for: NITRICOXIDE  PFT: No flowsheet data found.  Imaging: No results found.  Chart Review:    Specialty Problems      Pulmonary Problems   OSA (obstructive sleep apnea)    PSG  06/2013-AHI 6/h 08/2011 ([facility]) AHI 4/h No longer needs CPAP      COPD with asthma (HCC)    Chronic rhinitis      Allergies  Allergen Reactions  . Niaspan [Niacin Er] Nausea And Vomiting and Swelling    Swelling in mouth  . Pantoprazole     Mouth sores  . Bupropion Other (See Comments)    Uncontrollable shakes  . Penicillins Hives  . Statins   . Sulfonamide Derivatives Hives  . Apple Rash  . Banana Rash  . Milk-Related Compounds Rash    Immunization History  Administered Date(s) Administered  . Influenza Split 04/29/2013  . Influenza, High Dose Seasonal PF 03/30/2016, 04/19/2017, 05/02/2018  . Influenza,inj,Quad PF,6+ Mos 03/26/2014, 05/17/2015  . Pneumococcal Conjugate-13 04/29/2013  . Pneumococcal Polysaccharide-23 04/30/2014  . Tdap 05/26/2013  . Zoster 07/31/2012  . Zoster Recombinat (Shingrix) 04/27/2017, 06/13/2017    Past Medical History:  Diagnosis Date  . Anemia 07/05/2014  . Anxiety and depression 12/14/2008   Qualifier: Diagnosis of  By: LKellie SimmeringLPN, JAlmyra Free   . Arthritis   . Asthma   . Atypical chest pain 05/23/2015  . Carotid artery occlusion   . [place]   . Chicken pox as a child  . Chronic kidney disease    Bright's Disease at age 72  .  COPD (chronic obstructive pulmonary disease) ([place])   . GERD (gastroesophageal reflux disease)   . H. pylori infection 10/19/2013  . Hyperglycemia 03/14/2016  . Hyperlipidemia   . Hypertension   . Hypothyroidism   . Knee pain, bilateral 12/17/2012  . Left hip pain 05/23/2015  . Low back pain 09/19/2015  . Macular degeneration of both eyes 12/16/2015   Right eye is wet Left Eye is dry Shots every 11 to 12 weeks in right eye at opthamologist office  . Medicare annual wellness visit, subsequent 02/21/2015  . Mumps 72 yrs old  . Preventative health care 03/14/2016  . Pulmonary emboli ([place]) 05/26/2013  . Tremor   . UTI (lower urinary tract infection) 10/19/2013    Tobacco History: Social History   Tobacco Use  Smoking Status Former Smoker  . Packs/day: 2.00  . Years: 30.00    . Pack years: 60.00  . Types: Cigarettes  . Last attempt to quit: 12/03/1994  . Years since quitting: 23.5  Smokeless Tobacco Never Used   Counseling given: Not Answered   Outpatient Encounter Medications as of 06/14/2018  Medication Sig  . acetaminophen (TYLENOL) 500 MG tablet Take 500 mg by mouth every 6 (six) hours as needed.  . budesonide-formoterol (SYMBICORT) 80-4.5 MCG/ACT inhaler Inhale 2 puffs into the lungs 2 (two) times daily.  . calcium citrate-vitamin D (CITRACAL+D) 315-200 MG-UNIT per tablet Take 1 tablet by mouth daily.   . cetirizine (ZYRTEC) 10 MG tablet Take 1 tablet (10 mg total) by mouth daily as needed for allergies.  . Cholecalciferol (D3 MAXIMUM STRENGTH) 5000 UNITS capsule Take 5,000 Units by mouth daily.  . Choline Fenofibrate (FENOFIBRIC ACID) 135 MG CPDR TAKE 1 CAPSULE BY MOUTH ONCE DAILY  . citalopram (CELEXA) 20 MG tablet Take 2 tablets (40 mg total) by mouth daily.  . clonazePAM (KLONOPIN) 1 MG tablet TAKE 1 TABLET BY MOUTH THREE TIMES DAILY AS NEEDED FOR ANXIETY  . Cyanocobalamin (VITAMIN B-12 PO) Take by mouth.  . diclofenac sodium (VOLTAREN) 1 % GEL Apply 4 g topically 4 (four) times daily.  Marland Kitchen esomeprazole (NEXIUM) 40 MG capsule TAKE 1 CAPSULE BY MOUTH ONCE DAILY AT  12  NOON  . ezetimibe (ZETIA) 10 MG tablet Take 1 tablet (10 mg total) by mouth daily.  Marland Kitchen FISH OIL-KRILL OIL PO Take by mouth.  . furosemide (LASIX) 20 MG tablet Take 1 tablet (20 mg total) by mouth daily as needed for edema.  . gabapentin (NEURONTIN) 100 MG capsule TAKE 1 TO 2 CAPSULES BY MOUTH AT BEDTIME  . levothyroxine (SYNTHROID, LEVOTHROID) 125 MCG tablet Take 1 tablet (125 mcg total) by mouth daily before breakfast.  . losartan (COZAAR) 25 MG tablet TAKE 1 TABLET BY MOUTH ONCE DAILY  . montelukast (SINGULAIR) 10 MG tablet Take 1 tablet (10 mg total) by mouth at bedtime as needed.  . nitroGLYCERIN (NITROSTAT) 0.4 MG SL tablet Place 1 tablet (0.4 mg total) under the tongue every 5 (five)  minutes as needed for chest pain.  Marland Kitchen nystatin (MYCOSTATIN) 100000 UNIT/ML suspension Take 5 mLs (500,000 Units total) by mouth 4 (four) times daily.  Marland Kitchen omeprazole (PRILOSEC) 20 MG capsule Take 1 capsule (20 mg total) by mouth 2 (two) times daily before a meal.  . PT/INR Test STRP Check INR every 4 weeks,  PRN Dz:I26.99  . PT/INR Testing Monitor KIT Check INR every 4 weeks prn Dz: l26.99  . ranitidine (ZANTAC) 300 MG tablet TAKE 1 TABLET BY MOUTH ONCE DAILY WITH BREAKFAST  .  warfarin (COUMADIN) 5 MG tablet TAKE AS DIRECTED   No facility-administered encounter medications on file as of 06/14/2018.       Review of Systems  Constitutional:   No  weight loss, night sweats,  Fevers, chills, fatigue, or  lassitude.  HEENT:   No headaches,  Difficulty swallowing,  Tooth/dental problems, or  Sore throat,                No sneezing, itching, ear ache,  +nasal congestion, post nasal drip,   CV:  No chest pain,  Orthopnea, PND, swelling in lower extremities, anasarca, dizziness, palpitations, syncope.   GI  No heartburn, indigestion, abdominal pain, nausea, vomiting, diarrhea, change in bowel habits, loss of appetite, bloody stools.   Resp: No shortness of breath with exertion or at rest.  No excess mucus, no productive cough,  No non-productive cough,  No coughing up of blood.  No change in color of mucus.  No wheezing.  No chest wall deformity  Skin: no rash or lesions.  GU: no dysuria, change in color of urine, no urgency or frequency.  No flank pain, no hematuria   MS:  No joint pain or swelling.  No decreased range of motion.  No back pain.  Psych:  No change in mood or affect. No depression or anxiety.  No memory loss.       Physical Exam      Physical Exam  GEN: A/Ox3; pleasant , NAD, elderly   HEENT:  [place]/AT,  EACs-clear, TMs-wnl, NOSE-clear drainage  THROAT-clear, no lesions, no postnasal drip or exudate noted.   NECK:  Supple w/ fair ROM; no JVD; normal carotid  impulses w/o bruits; no thyromegaly or nodules palpated; no lymphadenopathy.  RESP  Clear  P & A; w/o, wheezes/ rales/ or rhonchi.no accessory muscle use, no dullness to percussion  CARD:  RRR, no m/r/g  , tr  peripheral edema, pulses intact, no cyanosis or clubbing.  GI:   Soft & nt; nml bowel sounds; no organomegaly or masses detected.  Musco: Warm bil, no deformities or joint swelling noted.   Neuro: alert, no focal deficits noted. Hand tremor   Skin: Warm, no lesions or rashes    Lab Results:  CBC  BMET    Assessment & Plan:     COPD with asthma ([place]) Compensated on present regimen   Plan  Patient Instructions  Continue on Symbicort 40mg 2 puffs Twice daily  , rinse after use.  Continue on Singulair daily  Continue on Zyrtec 118mAt bedtime   Follow up with Dr. AlElsworth SohoIn 4 months and As needed         Chronic rhinitis Cont on current regimen      TaRexene EdisonNP 06/14/2018

## 2018-06-14 NOTE — Assessment & Plan Note (Addendum)
Compensated on present regimen   Plan  Patient Instructions  Continue on Symbicort 53mcg 2 puffs Twice daily  , rinse after use.  Continue on Singulair daily  Continue on Zyrtec 10mg  At bedtime   Follow up with Dr. Elsworth Soho  In 4 months and As needed

## 2018-06-14 NOTE — Patient Instructions (Addendum)
Continue on Symbicort 68mcg 2 puffs Twice daily  , rinse after use.  Continue on Singulair daily  Continue on Zyrtec 10mg  At bedtime   Follow up with Dr. Elsworth Soho  In 4 months and As needed

## 2018-06-14 NOTE — Assessment & Plan Note (Signed)
Cont on current regimen  

## 2018-06-18 ENCOUNTER — Ambulatory Visit (INDEPENDENT_AMBULATORY_CARE_PROVIDER_SITE_OTHER): Payer: Medicare HMO

## 2018-06-18 DIAGNOSIS — Z7901 Long term (current) use of anticoagulants: Secondary | ICD-10-CM | POA: Diagnosis not present

## 2018-06-18 LAB — POCT INR: INR: 2.7 (ref 2.0–3.0)

## 2018-06-18 NOTE — Progress Notes (Addendum)
Pre visit review using our clinic tool,if applicable. No additional management support is needed unless otherwise documented below in the visit note.   Pt here for INR check per  Goal INR = 2.0-3.0  Last INR =3.3  Pt currently takes Coumadin 2.5 mg on Monday, Wednesday and and Friday and 5 mg on Tuesday, Thursday, Saturday and Sunday  Pt denies recent antibiotics, no dietary changes and no unusual bruising / bleeding.  INR today = 2.7  Pt advised per Dr. Charlett Blake patient to continue same dose and return for INR check in 1 month.  Nursing INR check note reviewed. Agree with documention and plan.

## 2018-06-25 DIAGNOSIS — H3554 Dystrophies primarily involving the retinal pigment epithelium: Secondary | ICD-10-CM | POA: Diagnosis not present

## 2018-06-25 DIAGNOSIS — H353211 Exudative age-related macular degeneration, right eye, with active choroidal neovascularization: Secondary | ICD-10-CM | POA: Diagnosis not present

## 2018-06-25 DIAGNOSIS — Z961 Presence of intraocular lens: Secondary | ICD-10-CM | POA: Diagnosis not present

## 2018-06-25 DIAGNOSIS — H43811 Vitreous degeneration, right eye: Secondary | ICD-10-CM | POA: Diagnosis not present

## 2018-06-25 DIAGNOSIS — H35363 Drusen (degenerative) of macula, bilateral: Secondary | ICD-10-CM | POA: Diagnosis not present

## 2018-06-25 DIAGNOSIS — H353121 Nonexudative age-related macular degeneration, left eye, early dry stage: Secondary | ICD-10-CM | POA: Diagnosis not present

## 2018-06-30 ENCOUNTER — Encounter: Payer: Self-pay | Admitting: Family Medicine

## 2018-07-01 ENCOUNTER — Other Ambulatory Visit: Payer: Self-pay | Admitting: Family Medicine

## 2018-07-01 ENCOUNTER — Ambulatory Visit: Payer: Medicare HMO | Admitting: Family Medicine

## 2018-07-01 MED ORDER — FAMOTIDINE 40 MG PO TABS: 40.0000 mg | ORAL_TABLET | Freq: Every day | ORAL | 3 refills | Status: DC

## 2018-07-08 ENCOUNTER — Ambulatory Visit (INDEPENDENT_AMBULATORY_CARE_PROVIDER_SITE_OTHER): Payer: Medicare HMO | Admitting: Family Medicine

## 2018-07-08 VITALS — BP 126/72 | HR 66 | Temp 97.0°F | Resp 18 | Wt 193.0 lb

## 2018-07-08 DIAGNOSIS — I509 Heart failure, unspecified: Secondary | ICD-10-CM | POA: Diagnosis not present

## 2018-07-08 DIAGNOSIS — I10 Essential (primary) hypertension: Secondary | ICD-10-CM | POA: Diagnosis not present

## 2018-07-08 DIAGNOSIS — T7840XS Allergy, unspecified, sequela: Secondary | ICD-10-CM | POA: Diagnosis not present

## 2018-07-08 DIAGNOSIS — Z9189 Other specified personal risk factors, not elsewhere classified: Secondary | ICD-10-CM | POA: Diagnosis not present

## 2018-07-08 DIAGNOSIS — E782 Mixed hyperlipidemia: Secondary | ICD-10-CM | POA: Diagnosis not present

## 2018-07-08 DIAGNOSIS — R739 Hyperglycemia, unspecified: Secondary | ICD-10-CM | POA: Diagnosis not present

## 2018-07-08 DIAGNOSIS — N189 Chronic kidney disease, unspecified: Secondary | ICD-10-CM | POA: Diagnosis not present

## 2018-07-08 DIAGNOSIS — E038 Other specified hypothyroidism: Secondary | ICD-10-CM | POA: Diagnosis not present

## 2018-07-08 DIAGNOSIS — N183 Chronic kidney disease, stage 3 (moderate): Secondary | ICD-10-CM | POA: Diagnosis not present

## 2018-07-08 DIAGNOSIS — Z889 Allergy status to unspecified drugs, medicaments and biological substances status: Secondary | ICD-10-CM

## 2018-07-08 DIAGNOSIS — I739 Peripheral vascular disease, unspecified: Secondary | ICD-10-CM

## 2018-07-08 DIAGNOSIS — E559 Vitamin D deficiency, unspecified: Secondary | ICD-10-CM

## 2018-07-08 DIAGNOSIS — I779 Disorder of arteries and arterioles, unspecified: Secondary | ICD-10-CM

## 2018-07-08 NOTE — Patient Instructions (Addendum)
Bone density shows osteopenia, which is thinner than normal but not as bad as osteoporosis. Recommend calcium intake of 1200 to 1500 mg daily, divided into roughly 3 doses. Best source is the diet and a single dairy serving is about 500 mg, a supplement of calcium citrate once or twice daily to balance diet is fine if not getting enough in diet. Also need Vitamin D 2000 IU caps, 1 cap daily if not already taking vitamin D. Also recommend weight baring exercise on hips and upper body to keep bones strong Start an 81 mg coated Aspirin daily as you stop the coumadin.    Hypertension Hypertension, commonly called high blood pressure, is when the force of blood pumping through the arteries is too strong. The arteries are the blood vessels that carry blood from the heart throughout the body. Hypertension forces the heart to work harder to pump blood and may cause arteries to become narrow or stiff. Having untreated or uncontrolled hypertension can cause heart attacks, strokes, kidney disease, and other problems. A blood pressure reading consists of a higher number over a lower number. Ideally, your blood pressure should be below 120/80. The first ("top") number is called the systolic pressure. It is a measure of the pressure in your arteries as your heart beats. The second ("bottom") number is called the diastolic pressure. It is a measure of the pressure in your arteries as the heart relaxes. What are the causes? The cause of this condition is not known. What increases the risk? Some risk factors for high blood pressure are under your control. Others are not. Factors you can change  Smoking.  Having type 2 diabetes mellitus, high cholesterol, or both.  Not getting enough exercise or physical activity.  Being overweight.  Having too much fat, sugar, calories, or salt (sodium) in your diet.  Drinking too much alcohol. Factors that are difficult or impossible to change  Having chronic kidney  disease.  Having a family history of high blood pressure.  Age. Risk increases with age.  Race. You may be at higher risk if you are African-American.  Gender. Men are at higher risk than women before age 29. After age 27, women are at higher risk than men.  Having obstructive sleep apnea.  Stress. What are the signs or symptoms? Extremely high blood pressure (hypertensive crisis) may cause:  Headache.  Anxiety.  Shortness of breath.  Nosebleed.  Nausea and vomiting.  Severe chest pain.  Jerky movements you cannot control (seizures).  How is this diagnosed? This condition is diagnosed by measuring your blood pressure while you are seated, with your arm resting on a surface. The cuff of the blood pressure monitor will be placed directly against the skin of your upper arm at the level of your heart. It should be measured at least twice using the same arm. Certain conditions can cause a difference in blood pressure between your right and left arms. Certain factors can cause blood pressure readings to be lower or higher than normal (elevated) for a short period of time:  When your blood pressure is higher when you are in a health care provider's office than when you are at home, this is called white coat hypertension. Most people with this condition do not need medicines.  When your blood pressure is higher at home than when you are in a health care provider's office, this is called masked hypertension. Most people with this condition may need medicines to control blood pressure.  If you  have a high blood pressure reading during one visit or you have normal blood pressure with other risk factors:  You may be asked to return on a different day to have your blood pressure checked again.  You may be asked to monitor your blood pressure at home for 1 week or longer.  If you are diagnosed with hypertension, you may have other blood or imaging tests to help your health care provider  understand your overall risk for other conditions. How is this treated? This condition is treated by making healthy lifestyle changes, such as eating healthy foods, exercising more, and reducing your alcohol intake. Your health care provider may prescribe medicine if lifestyle changes are not enough to get your blood pressure under control, and if:  Your systolic blood pressure is above 130.  Your diastolic blood pressure is above 80.  Your personal target blood pressure may vary depending on your medical conditions, your age, and other factors. Follow these instructions at home: Eating and drinking  Eat a diet that is high in fiber and potassium, and low in sodium, added sugar, and fat. An example eating plan is called the DASH (Dietary Approaches to Stop Hypertension) diet. To eat this way: ? Eat plenty of fresh fruits and vegetables. Try to fill half of your plate at each meal with fruits and vegetables. ? Eat whole grains, such as whole wheat pasta, brown rice, or whole grain bread. Fill about one quarter of your plate with whole grains. ? Eat or drink low-fat dairy products, such as skim milk or low-fat yogurt. ? Avoid fatty cuts of meat, processed or cured meats, and poultry with skin. Fill about one quarter of your plate with lean proteins, such as fish, chicken without skin, beans, eggs, and tofu. ? Avoid premade and processed foods. These tend to be higher in sodium, added sugar, and fat.  Reduce your daily sodium intake. Most people with hypertension should eat less than 1,500 mg of sodium a day.  Limit alcohol intake to no more than 1 drink a day for nonpregnant women and 2 drinks a day for men. One drink equals 12 oz of beer, 5 oz of wine, or 1 oz of hard liquor. Lifestyle  Work with your health care provider to maintain a healthy body weight or to lose weight. Ask what an ideal weight is for you.  Get at least 30 minutes of exercise that causes your heart to beat faster  (aerobic exercise) most days of the week. Activities may include walking, swimming, or biking.  Include exercise to strengthen your muscles (resistance exercise), such as pilates or lifting weights, as part of your weekly exercise routine. Try to do these types of exercises for 30 minutes at least 3 days a week.  Do not use any products that contain nicotine or tobacco, such as cigarettes and e-cigarettes. If you need help quitting, ask your health care provider.  Monitor your blood pressure at home as told by your health care provider.  Keep all follow-up visits as told by your health care provider. This is important. Medicines  Take over-the-counter and prescription medicines only as told by your health care provider. Follow directions carefully. Blood pressure medicines must be taken as prescribed.  Do not skip doses of blood pressure medicine. Doing this puts you at risk for problems and can make the medicine less effective.  Ask your health care provider about side effects or reactions to medicines that you should watch for. Contact a health  care provider if:  You think you are having a reaction to a medicine you are taking.  You have headaches that keep coming back (recurring).  You feel dizzy.  You have swelling in your ankles.  You have trouble with your vision. Get help right away if:  You develop a severe headache or confusion.  You have unusual weakness or numbness.  You feel faint.  You have severe pain in your chest or abdomen.  You vomit repeatedly.  You have trouble breathing. Summary  Hypertension is when the force of blood pumping through your arteries is too strong. If this condition is not controlled, it may put you at risk for serious complications.  Your personal target blood pressure may vary depending on your medical conditions, your age, and other factors. For most people, a normal blood pressure is less than 120/80.  Hypertension is treated with  lifestyle changes, medicines, or a combination of both. Lifestyle changes include weight loss, eating a healthy, low-sodium diet, exercising more, and limiting alcohol. This information is not intended to replace advice given to you by your health care provider. Make sure you discuss any questions you have with your health care provider. Document Released: 07/17/2005 Document Revised: 06/14/2016 Document Reviewed: 06/14/2016 Elsevier Interactive Patient Education  2018 [employer].  Back Pain, Adult Many adults have back pain from time to time. Common causes of back pain include:  A strained muscle or ligament.  Wear and tear (degeneration) of the spinal disks.  Arthritis.  A hit to the back.  Back pain can be short-lived (acute) or last a long time (chronic). A physical exam, lab tests, and imaging studies may be done to find the cause of your pain. Follow these instructions at home: Managing pain and stiffness  Take over-the-counter and prescription medicines only as told by your health care provider.  If directed, apply heat to the affected area as often as told by your health care provider. Use the heat source that your health care provider recommends, such as a moist heat pack or a heating pad. ? Place a towel between your skin and the heat source. ? Leave the heat on for 20-30 minutes. ? Remove the heat if your skin turns bright red. This is especially important if you are unable to feel pain, heat, or cold. You have a greater risk of getting burned.  If directed, apply ice to the injured area: ? Put ice in a plastic bag. ? Place a towel between your skin and the bag. ? Leave the ice on for 20 minutes, 2-3 times a day for the first 2-3 days. Activity  Do not stay in bed. Resting more than 1-2 days can delay your recovery.  Take short walks on even surfaces as soon as you are able. Try to increase the length of time you walk each day.  Do not sit, drive, or stand in one  place for more than 30 minutes at a time. Sitting or standing for long periods of time can put stress on your back.  Use proper lifting techniques. When you bend and lift, use positions that put less stress on your back: ? Whitney your knees. ? Keep the load close to your body. ? Avoid twisting.  Exercise regularly as told by your health care provider. Exercising will help your back heal faster. This also helps prevent back injuries by keeping muscles strong and flexible.  Your health care provider may recommend that you see a physical therapist. This  person can help you come up with a safe exercise program. Do any exercises as told by your physical therapist. Lifestyle  Maintain a healthy weight. Extra weight puts stress on your back and makes it difficult to have good posture.  Avoid activities or situations that make you feel anxious or stressed. Learn ways to manage anxiety and stress. One way to manage stress is through exercise. Stress and anxiety increase muscle tension and can make back pain worse. General instructions  Sleep on a firm mattress in a comfortable position. Try lying on your side with your knees slightly bent. If you lie on your back, put a pillow under your knees.  Follow your treatment plan as told by your health care provider. This may include: ? Cognitive or behavioral therapy. ? Acupuncture or massage therapy. ? Meditation or yoga. Contact a health care provider if:  You have pain that is not relieved with rest or medicine.  You have increasing pain going down into your legs or buttocks.  Your pain does not improve in 2 weeks.  You have pain at night.  You lose weight.  You have a fever or chills. Get help right away if:  You develop new bowel or bladder control problems.  You have unusual weakness or numbness in your arms or legs.  You develop nausea or vomiting.  You develop abdominal pain.  You feel faint. Summary  Many adults have back pain  from time to time. A physical exam, lab tests, and imaging studies may be done to find the cause of your pain.  Use proper lifting techniques. When you bend and lift, use positions that put less stress on your back.  Take over-the-counter and prescription medicines and apply heat or ice as directed by your health care provider. This information is not intended to replace advice given to you by your health care provider. Make sure you discuss any questions you have with your health care provider. Document Released: 07/17/2005 Document Revised: 08/21/2016 Document Reviewed: 08/21/2016 Elsevier Interactive Patient Education  [employer].

## 2018-07-08 NOTE — Assessment & Plan Note (Signed)
[id number] acceptable, minimize simple carbs. Increase exercise as tolerated. Continue current meds 

## 2018-07-08 NOTE — Assessment & Plan Note (Signed)
Well controlled, no changes to meds. Encouraged heart healthy diet such as the DASH diet and exercise as tolerated.  °

## 2018-07-08 NOTE — Assessment & Plan Note (Signed)
Encouraged heart healthy diet, increase exercise, avoid trans fats, consider a krill oil cap daily 

## 2018-07-08 NOTE — Assessment & Plan Note (Signed)
stable °

## 2018-07-08 NOTE — Assessment & Plan Note (Addendum)
Check ultrasound. This shows significant stenosis so will refer back for further vascular evaluation and maintain Coumadin for now.

## 2018-07-08 NOTE — Assessment & Plan Note (Signed)
On Levothyroxine, continue to monitor 

## 2018-07-09 ENCOUNTER — Ambulatory Visit (HOSPITAL_BASED_OUTPATIENT_CLINIC_OR_DEPARTMENT_OTHER)
Admission: RE | Admit: 2018-07-09 | Discharge: 2018-07-09 | Disposition: A | Discharge status: Home / Self Care | Payer: Medicare HMO | Source: Ambulatory Visit | Attending: Family Medicine | Admitting: Family Medicine

## 2018-07-09 ENCOUNTER — Other Ambulatory Visit: Payer: Self-pay | Admitting: Family Medicine

## 2018-07-09 ENCOUNTER — Telehealth: Payer: Self-pay | Admitting: Family Medicine

## 2018-07-09 DIAGNOSIS — I6523 Occlusion and stenosis of bilateral carotid arteries: Secondary | ICD-10-CM

## 2018-07-09 DIAGNOSIS — I779 Disorder of arteries and arterioles, unspecified: Secondary | ICD-10-CM

## 2018-07-09 DIAGNOSIS — I739 Peripheral vascular disease, unspecified: Secondary | ICD-10-CM

## 2018-07-09 DIAGNOSIS — I509 Heart failure, unspecified: Secondary | ICD-10-CM

## 2018-07-09 LAB — VITAMIN D 25 HYDROXY (VIT D DEFICIENCY, FRACTURES): VITD: 56.87 ng/mL (ref 30.00–100.00)

## 2018-07-09 LAB — LIPID PANEL
CHOLESTEROL: 172 mg/dL (ref 0–200)
HDL: 60.3 mg/dL (ref >39.00)
LDL Cholesterol: 88 mg/dL (ref 0–99)
NonHDL: 111.35
TRIGLYCERIDES: 118 mg/dL (ref 0.0–149.0)
Total CHOL/HDL Ratio: 3
VLDL: 23.6 mg/dL (ref 0.0–40.0)

## 2018-07-09 LAB — CBC
HCT: 39.6 % (ref 36.0–46.0)
Hemoglobin: 13 g/dL (ref 12.0–15.0)
MCHC: 32.9 g/dL (ref 30.0–36.0)
MCV: 89.3 fl (ref 78.0–100.0)
Platelets: 247 10*3/uL (ref 150.0–400.0)
RBC: 4.44 Mil/uL (ref 3.87–5.11)
RDW: 14 % (ref 11.5–15.5)
WBC: 7.1 10*3/uL (ref 4.0–10.5)

## 2018-07-09 LAB — TSH: TSH: 0.84 u[IU]/mL (ref 0.35–4.50)

## 2018-07-09 LAB — HEMOGLOBIN A1C: HEMOGLOBIN A1C: 5.5 % (ref 4.6–6.5)

## 2018-07-09 NOTE — Telephone Encounter (Signed)
I sent her to [facility] since she also has HTN and CHF because several of the cardiologists do vascular work also

## 2018-07-09 NOTE — Telephone Encounter (Signed)
Per Dr. Charlett Blake patient will need to see a vascular surgeon again due to critical stenosis in the right internal carotid artery and high grade stenosis in the proximal left internal carotid artery secondary to bulk, heterogeneous and irregular atherosclerotic plaque.   Patient wants to know if there is a vascular surgeon you recommend Dr. Charlett Blake  Please advise

## 2018-07-10 NOTE — Telephone Encounter (Signed)
Patient informed of PCP instructions. She did want to know if she is to continue taking warfarin?

## 2018-07-10 NOTE — Telephone Encounter (Signed)
She should go ahead and keep taking it til she sees cardiology and they advise.

## 2018-07-11 NOTE — Telephone Encounter (Signed)
Patient notified

## 2018-07-14 DIAGNOSIS — Z889 Allergy status to unspecified drugs, medicaments and biological substances status: Secondary | ICD-10-CM | POA: Insufficient documentation

## 2018-07-14 DIAGNOSIS — Z9189 Other specified personal risk factors, not elsewhere classified: Secondary | ICD-10-CM | POA: Insufficient documentation

## 2018-07-14 HISTORY — DX: Other specified personal risk factors, not elsewhere classified: Z91.89

## 2018-07-14 HISTORY — DX: Allergy status to unspecified drugs, medicaments and biological substances: Z88.9

## 2018-07-14 NOTE — Assessment & Plan Note (Signed)
No recent exacerbation 

## 2018-07-14 NOTE — Progress Notes (Signed)
Subjective:    Patient ID: Emily Mitchell, female    DOB: 02-08-1946, 72 y.o.   MRN: [id number]  No chief complaint on file.   HPI Patient is in today for follow-up.  She is accompanied by her son.  She is exercising again and is started back Silver sneakers she is very happy about that.  She is noting no increased joint pain especially on the left side.  No falls or trauma.  No redness warmth or swelling.  She continues to struggle with some congestion and is interested in repeat allergy testing.  She had allergy testing at the [facility] clinic in the past and questions the accuracy of the testing.  No recent febrile illness or hospitalization.  Is considering stopping Coumadin after discussing with her pulmonologist no bleeding episodes have been noted. Denies CP/palp/SOB/HA/fevers/GI or GU c/o. Taking meds as prescribed  Past Medical History:  Diagnosis Date  . Anemia 07/05/2014  . Anxiety and depression 12/14/2008   Qualifier: Diagnosis of  By: [name] LPN, Almyra Free    . Arthritis   . Asthma   . Atypical chest pain 05/23/2015  . Carotid artery occlusion   . CHF (congestive heart failure) ([place])   . Chicken pox as a child  . Chronic kidney disease    Bright's Disease at age 30   . COPD (chronic obstructive pulmonary disease) ([place])   . GERD (gastroesophageal reflux disease)   . H. pylori infection 10/19/2013  . Hyperglycemia 03/14/2016  . Hyperlipidemia   . Hypertension   . Hypothyroidism   . Knee pain, bilateral 12/17/2012  . Left hip pain 05/23/2015  . Low back pain 09/19/2015  . Macular degeneration of both eyes 12/16/2015   Right eye is wet Left Eye is dry Shots every 11 to 12 weeks in right eye at opthamologist office  . Medicare annual wellness visit, subsequent 02/21/2015  . Mumps 72 yrs old  . Preventative health care 03/14/2016  . Pulmonary emboli ([place]) 05/26/2013  . Tremor   . UTI (lower urinary tract infection) 10/19/2013    Past Surgical History:  Procedure Laterality Date    . ABDOMINAL HYSTERECTOMY  1984  . CAROTID ENDARTERECTOMY Right 05/10/07   cea  . CATARACT EXTRACTION Left   . EYE SURGERY     b/l  . knees replaced  2005 and 2011   both knees  . WISDOM TOOTH EXTRACTION      Family History  Problem Relation Age of Onset  . Stroke Mother        mini stroke  . Kidney disease Mother   . Heart failure Mother   . Hypertension Mother   . Diabetes Sister        type 2  . Kidney disease Sister   . Heart attack Maternal Grandfather   . Hyperlipidemia Sister     Social History   Socioeconomic History  . Marital status: Divorced    Spouse name: Not on file  . Number of children: 2  . Years of education: Not on file  . Highest education level: Not on file  Occupational History  . Not on file  Social Needs  . Financial resource strain: Not on file  . Food insecurity:    Worry: Not on file    Inability: Not on file  . Transportation needs:    Medical: Not on file    Non-medical: Not on file  Tobacco Use  . Smoking status: Former Smoker    Packs/day: 2.00  Years: 30.00    Pack years: 60.00    Types: Cigarettes    Last attempt to quit: 12/03/1994    Years since quitting: 23.6  . Smokeless tobacco: Never Used  Substance and Sexual Activity  . Alcohol use: Yes    Comment: rarely, 1/month  . Drug use: No  . Sexual activity: Not Currently    Comment: lives with son and friend, no dietary restrictions  Lifestyle  . Physical activity:    Days per week: Not on file    Minutes per session: Not on file  . Stress: Not on file  Relationships  . Social connections:    Talks on phone: Not on file    Gets together: Not on file    Attends religious service: Not on file    Active member of club or organization: Not on file    Attends meetings of clubs or organizations: Not on file    Relationship status: Not on file  . Intimate partner violence:    Fear of current or ex partner: Not on file    Emotionally abused: Not on file    Physically  abused: Not on file    Forced sexual activity: Not on file  Other Topics Concern  . Not on file  Social History Narrative   epworth sleepiness scale = 11 (06/15/2015)    Outpatient Medications Prior to Visit  Medication Sig Dispense Refill  . acetaminophen (TYLENOL) 500 MG tablet Take 500 mg by mouth every 6 (six) hours as needed.    . budesonide-formoterol (SYMBICORT) 80-4.5 MCG/ACT inhaler Inhale 2 puffs into the lungs 2 (two) times daily. 1 Inhaler 5  . calcium citrate-vitamin D (CITRACAL+D) 315-200 MG-UNIT per tablet Take 1 tablet by mouth daily.     . cetirizine (ZYRTEC) 10 MG tablet Take 1 tablet (10 mg total) by mouth daily as needed for allergies. 30 tablet 11  . Cholecalciferol (D3 MAXIMUM STRENGTH) 5000 UNITS capsule Take 5,000 Units by mouth daily.    . Choline Fenofibrate (FENOFIBRIC ACID) 135 MG CPDR TAKE 1 CAPSULE BY MOUTH ONCE DAILY 90 capsule 1  . citalopram (CELEXA) 20 MG tablet Take 2 tablets (40 mg total) by mouth daily. 180 tablet 1  . clonazePAM (KLONOPIN) 1 MG tablet TAKE 1 TABLET BY MOUTH THREE TIMES DAILY AS NEEDED FOR ANXIETY 90 tablet 2  . Cyanocobalamin (VITAMIN B-12 PO) Take by mouth.    . diclofenac sodium (VOLTAREN) 1 % GEL Apply 4 g topically 4 (four) times daily. 5 Tube 1  . esomeprazole (NEXIUM) 40 MG capsule TAKE 1 CAPSULE BY MOUTH ONCE DAILY AT  12  NOON 90 capsule 1  . ezetimibe (ZETIA) 10 MG tablet Take 1 tablet (10 mg total) by mouth daily. 90 tablet 1  . famotidine (PEPCID) 40 MG tablet Take 1 tablet (40 mg total) by mouth at bedtime. 30 tablet 3  . FISH OIL-KRILL OIL PO Take by mouth.    . furosemide (LASIX) 20 MG tablet Take 1 tablet (20 mg total) by mouth daily as needed for edema. 30 tablet 3  . gabapentin (NEURONTIN) 100 MG capsule TAKE 1 TO 2 CAPSULES BY MOUTH AT BEDTIME 180 capsule 1  . levothyroxine (SYNTHROID, LEVOTHROID) 125 MCG tablet Take 1 tablet (125 mcg total) by mouth daily before breakfast. 90 tablet 0  . losartan (COZAAR) 25 MG  tablet TAKE 1 TABLET BY MOUTH ONCE DAILY 90 tablet 1  . montelukast (SINGULAIR) 10 MG tablet Take 1 tablet (10 mg total)  by mouth at bedtime as needed. 90 tablet 3  . nitroGLYCERIN (NITROSTAT) 0.4 MG SL tablet Place 1 tablet (0.4 mg total) under the tongue every 5 (five) minutes as needed for chest pain. 25 tablet 3  . nystatin (MYCOSTATIN) 100000 UNIT/ML suspension Take 5 mLs (500,000 Units total) by mouth 4 (four) times daily. 473 mL 2  . omeprazole (PRILOSEC) 20 MG capsule Take 1 capsule (20 mg total) by mouth 2 (two) times daily before a meal. 28 capsule 0  . PT/INR Test STRP Check INR every 4 weeks,  PRN Dz:I26.99 48 each 0  . PT/INR Testing Monitor KIT Check INR every 4 weeks prn Dz: l26.99 1 each 0  . warfarin (COUMADIN) 5 MG tablet TAKE AS DIRECTED 90 tablet 1   No facility-administered medications prior to visit.     Allergies  Allergen Reactions  . Niaspan [Niacin Er] Nausea And Vomiting and Swelling    Swelling in mouth  . Pantoprazole     Mouth sores  . Bupropion Other (See Comments)    Uncontrollable shakes  . Penicillins Hives  . Statins   . Sulfonamide Derivatives Hives  . Apple Rash  . Banana Rash  . Milk-Related Compounds Rash    Review of Systems  Constitutional: Positive for malaise/fatigue. Negative for fever.  HENT: Positive for congestion.   Eyes: Negative for blurred vision.  Respiratory: Negative for shortness of breath.   Cardiovascular: Negative for chest pain, palpitations and leg swelling.  Gastrointestinal: Negative for abdominal pain, blood in stool and nausea.  Genitourinary: Negative for dysuria and frequency.  Musculoskeletal: Positive for joint pain. Negative for falls.  Skin: Negative for rash.  Neurological: Negative for dizziness, loss of consciousness and headaches.  Endo/Heme/Allergies: Negative for environmental allergies.  Psychiatric/Behavioral: Negative for depression. The patient is not nervous/anxious.        Objective:      Physical Exam Vitals signs and nursing note reviewed.  Constitutional:      General: She is not in acute distress.    Appearance: She is well-developed.  HENT:     Head: Normocephalic and atraumatic.     Nose: Nose normal.  Eyes:     General:        Right eye: No discharge.        Left eye: No discharge.  Neck:     Musculoskeletal: Normal range of motion and neck supple.  Cardiovascular:     Rate and Rhythm: Normal rate and regular rhythm.     Heart sounds: No murmur.  Pulmonary:     Effort: Pulmonary effort is normal.     Breath sounds: Normal breath sounds.  Abdominal:     General: Bowel sounds are normal.     Palpations: Abdomen is soft.     Tenderness: There is no abdominal tenderness.  Skin:    General: Skin is warm and dry.  Neurological:     Mental Status: She is alert and oriented to person, place, and time.     BP 126/72 (BP Location: Left Arm, Patient Position: Sitting, Cuff Size: Normal)   Pulse 66   Temp (!) 97 F (36.1 C) (Oral)   Resp 18   Wt 193 lb (87.5 kg)   SpO2 97%   BMI 34.19 kg/m  Wt Readings from Last 3 Encounters:  07/08/18 193 lb (87.5 kg)  06/14/18 192 lb (87.1 kg)  03/13/18 194 lb (88 kg)     Lab Results  Component Value Date   WBC  7.1 07/08/2018   HGB 13.0 07/08/2018   HCT 39.6 07/08/2018   PLT 247.0 07/08/2018   GLUCOSE 88 02/28/2018   CHOL 172 07/08/2018   TRIG 118.0 07/08/2018   HDL 60.30 07/08/2018   LDLDIRECT 129.0 02/28/2018   LDLCALC 88 07/08/2018   ALT 11 02/28/2018   AST 16 02/28/2018   NA 140 02/28/2018   K 5.1 02/28/2018   CL 104 02/28/2018   CREATININE 2.63 (H) 02/28/2018   BUN 35 (H) 02/28/2018   CO2 24 02/28/2018   TSH 0.84 07/08/2018   INR 2.7 06/18/2018   HGBA1C 5.5 07/08/2018    Lab Results  Component Value Date   TSH 0.84 07/08/2018   Lab Results  Component Value Date   WBC 7.1 07/08/2018   HGB 13.0 07/08/2018   HCT 39.6 07/08/2018   MCV 89.3 07/08/2018   PLT 247.0 07/08/2018   Lab  Results  Component Value Date   NA 140 02/28/2018   K 5.1 02/28/2018   CO2 24 02/28/2018   GLUCOSE 88 02/28/2018   BUN 35 (H) 02/28/2018   CREATININE 2.63 (H) 02/28/2018   BILITOT 0.4 02/28/2018   ALKPHOS 35 (L) 02/28/2018   AST 16 02/28/2018   ALT 11 02/28/2018   PROT 6.9 02/28/2018   ALBUMIN 4.4 02/28/2018   CALCIUM 10.1 02/28/2018   ANIONGAP 9 02/03/2017   GFR 19.09 (L) 02/28/2018   Lab Results  Component Value Date   CHOL 172 07/08/2018   Lab Results  Component Value Date   HDL 60.30 07/08/2018   Lab Results  Component Value Date   LDLCALC 88 07/08/2018   Lab Results  Component Value Date   TRIG 118.0 07/08/2018   Lab Results  Component Value Date   CHOLHDL 3 07/08/2018   Lab Results  Component Value Date   HGBA1C 5.5 07/08/2018       Assessment & Plan:   Problem List Items Addressed This Visit    Hypothyroidism    On Levothyroxine, continue to monitor      Relevant Orders   Ambulatory referral to Cardiology   CHF (congestive heart failure) ([place]) - Primary    No recent exacerbation      Relevant Orders   Ambulatory referral to Cardiology   Hyperlipidemia, mixed    Encouraged heart healthy diet, increase exercise, avoid trans fats, consider a krill oil cap daily      Relevant Orders   Ambulatory referral to Cardiology   Lipid panel (Completed)   Carotid artery disease ([place])    Check ultrasound. This shows significant stenosis so will refer back for further vascular evaluation and maintain Coumadin for now.       Relevant Orders   Ambulatory referral to Cardiology   US Carotid Bilateral (Completed)   Essential hypertension    Well controlled, no changes to meds. Encouraged heart healthy diet such as the DASH diet and exercise as tolerated.       Relevant Orders   Ambulatory referral to Cardiology   CBC (Completed)   TSH (Completed)   Hyperglycemia    hgba1c acceptable, minimize simple carbs. Increase exercise as tolerated. Continue  current meds.       Relevant Orders   Ambulatory referral to Cardiology   Hemoglobin A1c (Completed)   Vitamin D deficiency   Relevant Orders   VITAMIN D 25 Hydroxy (Vit-D Deficiency, Fractures) (Completed)   H/O multiple allergies    Patient had allergy testing at [facility] clinic in past and is questioning  the accuracy of the testing. Will try and obtain and is referred to allergist for renewed testing.        Other Visit Diagnoses    Allergic state, sequela       Relevant Orders   Ambulatory referral to Allergy      I have discontinued Pearline Cables. [place]'s warfarin. I am also having her maintain her calcium citrate-vitamin D, Cholecalciferol, nitroGLYCERIN, Cyanocobalamin (VITAMIN B-12 PO), acetaminophen, furosemide, FISH OIL-KRILL OIL PO, cetirizine, omeprazole, PT/INR Testing Monitor, PT/INR Test, diclofenac sodium, citalopram, clonazePAM, gabapentin, ezetimibe, nystatin, budesonide-formoterol, esomeprazole, Fenofibric Acid, losartan, montelukast, levothyroxine, and famotidine.  No orders of the defined types were placed in this encounter.    Penni Homans, MD

## 2018-07-14 NOTE — Assessment & Plan Note (Signed)
Patient had allergy testing at [facility] clinic in past and is questioning the accuracy of the testing. Will try and obtain and is referred to allergist for renewed testing.

## 2018-07-17 ENCOUNTER — Ambulatory Visit: Payer: Medicare HMO | Admitting: Cardiology

## 2018-07-17 ENCOUNTER — Ambulatory Visit (HOSPITAL_BASED_OUTPATIENT_CLINIC_OR_DEPARTMENT_OTHER)
Admission: RE | Admit: 2018-07-17 | Discharge: 2018-07-17 | Disposition: A | Discharge status: Home / Self Care | Payer: Medicare HMO | Source: Ambulatory Visit | Attending: Cardiology | Admitting: Cardiology

## 2018-07-17 ENCOUNTER — Encounter: Payer: Self-pay | Admitting: Cardiology

## 2018-07-17 VITALS — BP 128/72 | HR 70 | Ht 63.0 in | Wt 192.0 lb

## 2018-07-17 DIAGNOSIS — I6521 Occlusion and stenosis of right carotid artery: Secondary | ICD-10-CM | POA: Diagnosis not present

## 2018-07-17 DIAGNOSIS — I10 Essential (primary) hypertension: Secondary | ICD-10-CM | POA: Diagnosis not present

## 2018-07-17 DIAGNOSIS — Z86711 Personal history of pulmonary embolism: Secondary | ICD-10-CM

## 2018-07-17 DIAGNOSIS — N289 Disorder of kidney and ureter, unspecified: Secondary | ICD-10-CM | POA: Insufficient documentation

## 2018-07-17 DIAGNOSIS — I2699 Other pulmonary embolism without acute cor pulmonale: Secondary | ICD-10-CM

## 2018-07-17 DIAGNOSIS — I209 Angina pectoris, unspecified: Secondary | ICD-10-CM | POA: Insufficient documentation

## 2018-07-17 DIAGNOSIS — R9389 Abnormal findings on diagnostic imaging of other specified body structures: Secondary | ICD-10-CM

## 2018-07-17 DIAGNOSIS — E782 Mixed hyperlipidemia: Secondary | ICD-10-CM | POA: Diagnosis not present

## 2018-07-17 DIAGNOSIS — R079 Chest pain, unspecified: Secondary | ICD-10-CM | POA: Diagnosis not present

## 2018-07-17 DIAGNOSIS — R05 Cough: Secondary | ICD-10-CM | POA: Diagnosis not present

## 2018-07-17 HISTORY — DX: Personal history of pulmonary embolism: Z86.711

## 2018-07-17 HISTORY — DX: Disorder of kidney and ureter, unspecified: N28.9

## 2018-07-17 LAB — POCT INR: INR: 1.8 — AB (ref 2.0–3.0)

## 2018-07-17 MED ORDER — ASPIRIN EC 81 MG PO TBEC: 81.0000 mg | DELAYED_RELEASE_TABLET | Freq: Every day | ORAL | 3 refills | Status: AC

## 2018-07-17 NOTE — Patient Instructions (Addendum)
Medication Instructions:  Your physician has recommended you make the following change in your medication:   START 81 mg enteric coated aspirin daily while not taking warfarin  Please avoid taking nitroglycerin unless absolutely needed- this is per Dr. Burns Mitchell taking warfarin today 12/18, you will be advised when to resume this post your heart cath.  If you need a refill on your cardiac medications before your next appointment, please call your pharmacy.   Lab work: Your physician recommends that you have the following labs drawn: BMP and CBC to be done today.  If you have labs (blood work) drawn today and your tests are completely normal, you will receive your results only by: Marland Kitchen MyChart Message (if you have MyChart) OR . A paper copy in the mail If you have any lab test that is abnormal or we need to change your treatment, we will call you to review the results.  Testing/Procedures: A chest x-ray takes a picture of the organs and structures inside the chest, including the heart, lungs, and blood vessels. This test can show several things, including, whether the heart is enlarges; whether fluid is building up in the lungs; and whether pacemaker / defibrillator leads are still in place.  Your physician has requested that you have an echocardiogram. Echocardiography is a painless test that uses sound waves to create images of your heart. It provides your doctor with information about the size and shape of your heart and how well your heart's chambers and valves are working. This procedure takes approximately one hour. There are no restrictions for this procedure.    [address] [place], [place] [address] Dept: [phone] Loc: [phone]  Emily Mitchell  07/17/2018  You are scheduled for a Cardiac Catheterization on Thursday, December 19 with Dr. Quay Mitchell.  1.  Please arrive at the [facility] (Main Entrance A) at [facility]: [address] [place], [place] [address] at 7:00 AM (This time is two hours before your procedure to ensure your preparation). Free valet parking service is available.   Special note: Every effort is made to have your procedure done on time. Please understand that emergencies sometimes delay scheduled procedures.  2. Diet: Do not eat solid foods after midnight.  The patient may have clear liquids until 5am upon the day of the procedure.  3. Labs: STAT labs to be done today.  4. Medication instructions in preparation for your procedure:   Contrast Allergy: No  Please do not take your losartan the morning of the heart cath.  Stop taking Coumadin (Warfarin) on Wednesday, December 19.  On the morning of your procedure, take your Aspirin and any morning medicines NOT listed above.  You may use sips of water.  5. Plan for one night stay--bring personal belongings. 6. Bring a current list of your medications and current insurance cards. 7. You MUST have a responsible person to drive you home. 8. Someone MUST be with you the first 24 hours after you arrive home or your discharge will be delayed. 9. Please wear clothes that are easy to get on and off and wear slip-on shoes.  Thank you for allowing [place] to care for you!   -- [name]   Follow-Up: At [facility], you and your health needs are our priority.  As part of our continuing mission to provide you with exceptional heart care, we  have created designated Provider Care Teams.  These Care Teams include your primary Cardiologist (physician) and Advanced Practice Providers (APPs -  Physician Assistants and Nurse Practitioners) who all work together to provide you with the care you need, when you need it.  You will need a follow up appointment in 4 weeks.  Please call our office 2 months in advance to schedule this appointment.  You  may see another member of our [employer] Provider Team in [place]: Emily Campus, MD . Emily More, MD  Any Other Special Instructions Will Be Listed Below (If Applicable).   Coronary Angiogram With Stent Coronary angiogram with stent placement is a procedure to widen or open a narrow blood vessel of the heart (coronary artery). Arteries may become blocked by cholesterol buildup (plaques) in the lining of the wall. When a coronary artery becomes partially blocked, blood flow to that area decreases. This may lead to chest pain or a heart attack (myocardial infarction). A stent is a small piece of metal that looks like mesh or a spring. Stent placement may be done as treatment for a heart attack or right after a coronary angiogram in which a blocked artery is found. Let your health care provider know about:  Any allergies you have.  All medicines you are taking, including vitamins, herbs, eye drops, creams, and over-the-counter medicines.  Any problems you or family members have had with anesthetic medicines.  Any blood disorders you have.  Any surgeries you have had.  Any medical conditions you have.  Whether you are pregnant or may be pregnant. What are the risks? Generally, this is a safe procedure. However, problems may occur, including:  Damage to the heart or its blood vessels.  A return of blockage.  Bleeding, infection, or bruising at the insertion site.  A collection of blood under the skin (hematoma) at the insertion site.  A blood clot in another part of the body.  Kidney injury.  Allergic reaction to the dye or contrast that is used.  Bleeding into the abdomen (retroperitoneal bleeding). What happens before the procedure? Staying hydrated Follow instructions from your health care provider about hydration, which may include:  Up to 2 hours before the procedure - you may continue to drink clear liquids, such as water, clear fruit juice, black coffee,  and plain tea.  Eating and drinking restrictions Follow instructions from your health care provider about eating and drinking, which may include:  8 hours before the procedure - stop eating heavy meals or foods such as meat, fried foods, or fatty foods.  6 hours before the procedure - stop eating light meals or foods, such as toast or cereal.  2 hours before the procedure - stop drinking clear liquids. Ask your health care provider about:  Changing or stopping your regular medicines. This is especially important if you are taking diabetes medicines or blood thinners.  Taking medicines such as ibuprofen. These medicines can thin your blood. Do not take these medicines before your procedure if your health care provider instructs you not to. Generally, aspirin is recommended before a procedure of passing a small, thin tube (catheter) through a blood vessel and into the heart (cardiac catheterization). What happens during the procedure?   An IV tube will be inserted into one of your veins.  You will be given one or Mitchell of the following: ? A medicine to help you relax (sedative). ? A medicine to numb the area where the catheter will be inserted into  an artery (local anesthetic).  To reduce your risk of infection: ? Your health care team will wash or sanitize their hands. ? Your skin will be washed with soap. ? Hair may be removed from the area where the catheter will be inserted.  Using a guide wire, the catheter will be inserted into an artery. The location may be in your groin, in your wrist, or in the fold of your arm (near your elbow).  A type of X-ray (fluoroscopy) will be used to help guide the catheter to the opening of the arteries in the heart.  A dye will be injected into the catheter, and X-rays will be taken. The dye will help to show where any narrowing or blockages are located in the arteries.  A tiny wire will be guided to the blocked spot, and a balloon will be inflated  to make the artery wider.  The stent will be expanded and will crush the plaques into the wall of the vessel. The stent will hold the area open and improve the blood flow. Most stents have a drug coating to reduce the risk of the stent narrowing over time.  The artery may be made wider using a drill, laser, or other tools to remove plaques.  When the blood flow is better, the catheter will be removed. The lining of the artery will grow over the stent, which stays where it was placed. This procedure may vary among health care providers and hospitals. What happens after the procedure?  If the procedure is done through the leg, you will be kept in bed lying flat for about 6 hours. You will be instructed to not bend and not cross your legs.  The insertion site will be checked frequently.  The pulse in your foot or wrist will be checked frequently.  You may have additional blood tests, X-rays, and a test that records the electrical activity of your heart (electrocardiogram, or ECG). This information is not intended to replace advice given to you by your health care provider. Make sure you discuss any questions you have with your health care provider. Document Released: 01/21/2003 Document Revised: 10/26/2017 Document Reviewed: 02/20/2016 Elsevier Interactive Patient Education  2019 Elsevier Inc.  Aspirin and Your Heart  Aspirin is a medicine that prevents the cells in the blood that are used for clotting, called platelets, from sticking together. Aspirin can be used to help reduce the risk of blood clots, heart attacks, and other heart-related problems. Can I take aspirin? Your health care provider will help you determine whether it is safe and beneficial for you to take aspirin daily. Taking aspirin daily may be helpful if you:  Have had a heart attack or chest pain.  Are at risk for a heart attack.  Have undergone open-heart surgery, such as coronary artery bypass surgery (CABG).  Have  had coronary angioplasty or a stent.  Have had certain types of stroke or transient ischemic attack (TIA).  Have peripheral artery disease (PAD).  Have chronic heart rhythm problems such as atrial fibrillation and cannot take an anticoagulant.  Have valve disease or have had surgery on a valve. What are the risks? Daily use of aspirin can cause side effects. Some of these include:  Bleeding. Bleeding problems can be minor or serious. An example of a minor problem is a cut that does not stop bleeding. An example of a Mitchell serious problem is stomach bleeding or, rarely, bleeding into the brain. Your risk of bleeding is increased if  you are also taking non-steroidal anti-inflammatory drugs (NSAIDs).  Increased bruising.  Upset stomach.  An allergic reaction. People who have nasal polyps have an increased risk of developing an aspirin allergy. General guidelines  Take aspirin only as told by your health care provider. Make sure that you understand how much you should take and what form you should take. The two forms of aspirin are: ? Non-enteric-coated.This type of aspirin does not have a coating and is absorbed quickly. This type of aspirin also comes in a chewable form. ? Enteric-coated. This type of aspirin has a coating that releases the medicine very slowly. Enteric-coated aspirin might cause less stomach upset than non-enteric-coated aspirin. This type of aspirin should not be chewed or crushed.  Limit alcohol intake to no Mitchell than 1 drink a day for nonpregnant women and 2 drinks a day for men. Drinking alcohol increases your risk of bleeding. One drink equals 12 oz of beer, 5 oz of wine, or 1 oz of hard liquor. Contact a health care provider if you:  Have unusual bleeding or bruising.  Have stomach pain or nausea.  Have ringing in your ears.  Have an allergic reaction that causes: ? Hives. ? Itchy skin. ? Swelling of the lips, tongue, or face. Get help right away if  you:  Notice that your bowel movements are bloody, dark red, or black in color.  Vomit or cough up blood.  Have blood in your urine.  Cough, have noisy breathing (wheeze), or feel short of breath.  Have chest pain, especially if the pain spreads to the arms, back, neck, or jaw.  Have a severe headache, or a headache with confusion, or dizziness. These symptoms may represent a serious problem that is an emergency. Do not wait to see if the symptoms will go away. Get medical help right away. Call your local emergency services (911 in the [place] Do not drive yourself to the hospital. Summary  Aspirin can be used to help reduce the risk of blood clots, heart attacks, and other heart-related problems.  Daily use of aspirin can increase your risk of side effects. Your health care provider will help you determine whether it is safe and beneficial for you to take aspirin daily.  Take aspirin only as told by your health care provider. Make sure that you understand how much you can take and what form you can take. This information is not intended to replace advice given to you by your health care provider. Make sure you discuss any questions you have with your health care provider. Document Released: 06/29/2008 Document Revised: 05/17/2017 Document Reviewed: 05/17/2017 Elsevier Interactive Patient Education  2019 [employer].

## 2018-07-17 NOTE — H&P (View-Only) (Signed)
Cardiology Office Note:    Date:  07/17/2018   ID:  Emily Mitchell, DOB April 20, 1946, MRN [id number]  PCP:  Mosie Lukes, MD  Cardiologist:  Jenean Lindau, MD   Referring MD: Mosie Lukes, MD    ASSESSMENT:    1. Angina pectoris ([place])   2. Hyperlipidemia, mixed   3. Occlusion of right carotid artery   4. Essential hypertension   5. History of pulmonary embolism   6. Renal insufficiency    PLAN:    In order of problems listed above:  1. Secondary prevention stressed with the patient.  Portance of compliance with diet and medication stressed and she vocalized understanding.  Her blood pressure is stable.  Diet was discussed for dyslipidemia. 2. Her symptoms are very concerning.  To make this challenging decision she has significant renal insufficiency.In view of the patient's symptoms, I discussed with the patient options for evaluation. Invasive and noninvasive options were given to the patient. I discussed stress testing and coronary angiography and left heart catheterization at length. Benefits, pros and cons of each approach were discussed at length. Patient had multiple questions which were answered to the patient's satisfaction. Patient opted for invasive evaluation and we will set up for coronary angiography and left heart catheterization. Further recommendations will be made based on the findings with coronary angiography. In the interim if the patient has any significant symptoms in hospital to the nearest emergency room. 3. I have left and orders with the patient to initiate hydration and also possible bicarb therapy for a few hours before the coronary angiography.  I will leave it up to the interventional cardiologist colleague of mine to see whether she needs a left ventriculography.  I would prefer it not be done to lessen the amount of dye that is given through the procedure.  We could do an echocardiogram to assess her left ventricular systolic function.  I will try to  see if I can obtain one as soon as possible.  Also her INR today was 1.8 so she will stop her warfarin in preparation for coronary angiography on Friday.  Again issues with radiocontrast dye and renal risks were explained to the patient at extensive length and questions were answered to her satisfaction.  I intentionally did not give her sublingual nitroglycerin at this time in view of her cerebrovascular disease and carotid artery stenosis to avoid the risk of hypotension.  I told her not to exert herself and rest and relax so her symptoms on exertion did not recur till the above test is done.   Medication Adjustments/Labs and Tests Ordered: Current medicines are reviewed at length with the patient today.  Concerns regarding medicines are outlined above.  No orders of the defined types were placed in this encounter.  No orders of the defined types were placed in this encounter.    History of Present Illness:    Emily Mitchell is a 72 y.o. female who is being seen today for the evaluation of preop cardiovascular risk assessment at the request of Mosie Lukes, MD.  Patient is a pleasant 72 year old female.  She has past medical history of atherosclerotic vascular disease.  She is post right carotid endarterectomy in the remote past.  Recent evaluation reveals that this artery is completely blocked.  She has significant disease in the left carotid artery and is being evaluated for surgery.  The patient has history of essential hypertension.  She has been a smoker in the past.  She mentions to me that consistently upon exertion she has chest tightness and she has to stop and take rest.  This goes to the necks and sometimes to the arm.  At the time of my evaluation, the patient is alert awake oriented and in no distress.  She has significant renal insufficiency in the last creatinine more than 4 months ago was greater than 2.5.  At the time of my evaluation, the patient is alert awake oriented and in no  distress.  Past Medical History:  Diagnosis Date  . Anemia 07/05/2014  . Anxiety and depression 12/14/2008   Qualifier: Diagnosis of  By: [name] LPN, Almyra Free    . Arthritis   . Asthma   . Atypical chest pain 05/23/2015  . Carotid artery occlusion   . CHF (congestive heart failure) ([place])   . Chicken pox as a child  . Chronic kidney disease    Bright's Disease at age 52   . COPD (chronic obstructive pulmonary disease) ([place])   . GERD (gastroesophageal reflux disease)   . H. pylori infection 10/19/2013  . Hyperglycemia 03/14/2016  . Hyperlipidemia   . Hypertension   . Hypothyroidism   . Knee pain, bilateral 12/17/2012  . Left hip pain 05/23/2015  . Low back pain 09/19/2015  . Macular degeneration of both eyes 12/16/2015   Right eye is wet Left Eye is dry Shots every 11 to 12 weeks in right eye at opthamologist office  . Medicare annual wellness visit, subsequent 02/21/2015  . Mumps 72 yrs old  . Preventative health care 03/14/2016  . Pulmonary emboli ([place]) 05/26/2013  . Tremor   . UTI (lower urinary tract infection) 10/19/2013    Past Surgical History:  Procedure Laterality Date  . ABDOMINAL HYSTERECTOMY  1984  . CAROTID ENDARTERECTOMY Right 05/10/07   cea  . CATARACT EXTRACTION Left   . EYE SURGERY     b/l  . knees replaced  2005 and 2011   both knees  . WISDOM TOOTH EXTRACTION      Current Medications: Current Meds  Medication Sig  . acetaminophen (TYLENOL) 500 MG tablet Take 500 mg by mouth every 6 (six) hours as needed.  . budesonide-formoterol (SYMBICORT) 80-4.5 MCG/ACT inhaler Inhale 2 puffs into the lungs 2 (two) times daily.  . calcium citrate-vitamin D (CITRACAL+D) 315-200 MG-UNIT per tablet Take 1 tablet by mouth daily.   . cetirizine (ZYRTEC) 10 MG tablet Take 1 tablet (10 mg total) by mouth daily as needed for allergies.  . Cholecalciferol (D3 MAXIMUM STRENGTH) 5000 UNITS capsule Take 5,000 Units by mouth daily.  . Choline Fenofibrate (FENOFIBRIC ACID) 135 MG CPDR  TAKE 1 CAPSULE BY MOUTH ONCE DAILY  . citalopram (CELEXA) 20 MG tablet Take 2 tablets (40 mg total) by mouth daily.  . clonazePAM (KLONOPIN) 1 MG tablet TAKE 1 TABLET BY MOUTH THREE TIMES DAILY AS NEEDED FOR ANXIETY  . Cyanocobalamin (VITAMIN B-12 PO) Take by mouth.  . diclofenac sodium (VOLTAREN) 1 % GEL Apply 4 g topically 4 (four) times daily.  Marland Kitchen esomeprazole (NEXIUM) 40 MG capsule TAKE 1 CAPSULE BY MOUTH ONCE DAILY AT  12  NOON (Patient taking differently: Take 40 mg by mouth daily at 12 noon. )  . ezetimibe (ZETIA) 10 MG tablet Take 1 tablet (10 mg total) by mouth daily.  Marland Kitchen FISH OIL-KRILL OIL PO Take 1 tablet by mouth daily.   Marland Kitchen gabapentin (NEURONTIN) 100 MG capsule TAKE 1 TO 2 CAPSULES BY MOUTH AT BEDTIME  . levothyroxine (  SYNTHROID, LEVOTHROID) 125 MCG tablet Take 1 tablet (125 mcg total) by mouth daily before breakfast.  . losartan (COZAAR) 25 MG tablet TAKE 1 TABLET BY MOUTH ONCE DAILY  . montelukast (SINGULAIR) 10 MG tablet Take 1 tablet (10 mg total) by mouth at bedtime as needed.  . nitroGLYCERIN (NITROSTAT) 0.4 MG SL tablet Place 1 tablet (0.4 mg total) under the tongue every 5 (five) minutes as needed for chest pain.  Marland Kitchen omeprazole (PRILOSEC) 20 MG capsule Take 1 capsule (20 mg total) by mouth 2 (two) times daily before a meal.  . PT/INR Test STRP Check INR every 4 weeks,  PRN Dz:I26.99  . PT/INR Testing Monitor KIT Check INR every 4 weeks prn Dz: l26.99  . warfarin (COUMADIN) 2.5 MG tablet Take 1 tablet by mouth every Monday, Wednesday, and Friday.  . warfarin (COUMADIN) 5 MG tablet Take 1 tablet by mouth every Tuesday, Thursday, Saturday, and Sunday.     Allergies:   Niaspan [niacin er]; Pantoprazole; Bupropion; Penicillins; Statins; Sulfonamide derivatives; Apple; Banana; and Milk-related compounds   Social History   Socioeconomic History  . Marital status: Divorced    Spouse name: Not on file  . Number of children: 2  . Years of education: Not on file  . Highest  education level: Not on file  Occupational History  . Not on file  Social Needs  . Financial resource strain: Not on file  . Food insecurity:    Worry: Not on file    Inability: Not on file  . Transportation needs:    Medical: Not on file    Non-medical: Not on file  Tobacco Use  . Smoking status: Former Smoker    Packs/day: 2.00    Years: 30.00    Pack years: 60.00    Types: Cigarettes    Last attempt to quit: 12/03/1994    Years since quitting: 23.6  . Smokeless tobacco: Never Used  Substance and Sexual Activity  . Alcohol use: Yes    Comment: rarely, 1/month  . Drug use: No  . Sexual activity: Not Currently    Comment: lives with son and friend, no dietary restrictions  Lifestyle  . Physical activity:    Days per week: Not on file    Minutes per session: Not on file  . Stress: Not on file  Relationships  . Social connections:    Talks on phone: Not on file    Gets together: Not on file    Attends religious service: Not on file    Active member of club or organization: Not on file    Attends meetings of clubs or organizations: Not on file    Relationship status: Not on file  Other Topics Concern  . Not on file  Social History Narrative   epworth sleepiness scale = 11 (06/15/2015)     Family History: The patient's family history includes Diabetes in her sister; Heart attack in her maternal grandfather; Heart failure in her mother; Hyperlipidemia in her sister; Hypertension in her mother; Kidney disease in her mother and sister; Stroke in her mother.  ROS:   Please see the history of present illness.    All other systems reviewed and are negative.  EKGs/Labs/Other Studies Reviewed:    The following studies were reviewed today: EKG reveals sinus rhythm with nonspecific ST-T changes.   Recent Labs: 02/28/2018: ALT 11; BUN 35; Creatinine, Ser 2.63; Potassium 5.1; Sodium 140 07/08/2018: Hemoglobin 13.0; Platelets 247.0; TSH 0.84  Recent Lipid Panel  Component  Value Date/Time   CHOL 172 07/08/2018 1511   TRIG 118.0 07/08/2018 1511   HDL 60.30 07/08/2018 1511   CHOLHDL 3 07/08/2018 1511   VLDL 23.6 07/08/2018 1511   LDLCALC 88 07/08/2018 1511   LDLDIRECT 129.0 02/28/2018 1458    Physical Exam:    VS:  BP 128/72 (BP Location: Right Arm, Patient Position: Sitting, Cuff Size: Normal)   Pulse 70   Ht _0  (1.6 m)   Wt 192 lb (87.1 kg)   SpO2 96%   BMI 34.01 kg/m     Wt Readings from Last 3 Encounters:  07/17/18 192 lb (87.1 kg)  07/08/18 193 lb (87.5 kg)  06/14/18 192 lb (87.1 kg)     GEN: Patient is in no acute distress HEENT: Normal NECK: No JVD; No carotid bruits LYMPHATICS: No lymphadenopathy CARDIAC: S1 S2 regular, 2/6 systolic murmur at the apex. RESPIRATORY:  Clear to auscultation without rales, wheezing or rhonchi  ABDOMEN: Soft, non-tender, non-distended MUSCULOSKELETAL:  No edema; No deformity  SKIN: Warm and dry NEUROLOGIC:  Alert and oriented x 3 PSYCHIATRIC:  Normal affect    Signed, Jenean Lindau, MD  07/17/2018 2:17 PM    [facility]

## 2018-07-17 NOTE — Addendum Note (Signed)
Addended by: Mattie Marlin on: 07/17/2018 02:52 PM   Modules accepted: Orders

## 2018-07-17 NOTE — Progress Notes (Signed)
Cardiology Office Note:    Date:  07/17/2018   ID:  Emily Mitchell, DOB 12/07/1945, MRN [id number]  PCP:  Blyth, Stacey A, MD  Cardiologist:   R , MD   Referring MD: Blyth, Stacey A, MD    ASSESSMENT:    1. Angina pectoris (HCC)   2. Hyperlipidemia, mixed   3. Occlusion of right carotid artery   4. Essential hypertension   5. History of pulmonary embolism   6. Renal insufficiency    PLAN:    In order of problems listed above:  1. Secondary prevention stressed with the patient.  Portance of compliance with diet and medication stressed and she vocalized understanding.  Her blood pressure is stable.  Diet was discussed for dyslipidemia. 2. Her symptoms are very concerning.  To make this challenging decision she has significant renal insufficiency.In view of the patient's symptoms, I discussed with the patient options for evaluation. Invasive and noninvasive options were given to the patient. I discussed stress testing and coronary angiography and left heart catheterization at length. Benefits, pros and cons of each approach were discussed at length. Patient had multiple questions which were answered to the patient's satisfaction. Patient opted for invasive evaluation and we will set up for coronary angiography and left heart catheterization. Further recommendations will be made based on the findings with coronary angiography. In the interim if the patient has any significant symptoms in hospital to the nearest emergency room. 3. I have left and orders with the patient to initiate hydration and also possible bicarb therapy for a few hours before the coronary angiography.  I will leave it up to the interventional cardiologist colleague of mine to see whether she needs a left ventriculography.  I would prefer it not be done to lessen the amount of dye that is given through the procedure.  We could do an echocardiogram to assess her left ventricular systolic function.  I will try to  see if I can obtain one as soon as possible.  Also her INR today was 1.8 so she will stop her warfarin in preparation for coronary angiography on Friday.  Again issues with radiocontrast dye and renal risks were explained to the patient at extensive length and questions were answered to her satisfaction.  I intentionally did not give her sublingual nitroglycerin at this time in view of her cerebrovascular disease and carotid artery stenosis to avoid the risk of hypotension.  I told her not to exert herself and rest and relax so her symptoms on exertion did not recur till the above test is done.   Medication Adjustments/Labs and Tests Ordered: Current medicines are reviewed at length with the patient today.  Concerns regarding medicines are outlined above.  No orders of the defined types were placed in this encounter.  No orders of the defined types were placed in this encounter.    History of Present Illness:    Emily Mitchell is a 72 y.o. female who is being seen today for the evaluation of preop cardiovascular risk assessment at the request of Blyth, Stacey A, MD.  Patient is a pleasant 72-year-old female.  She has past medical history of atherosclerotic vascular disease.  She is post right carotid endarterectomy in the remote past.  Recent evaluation reveals that this artery is completely blocked.  She has significant disease in the left carotid artery and is being evaluated for surgery.  The patient has history of essential hypertension.  She has been a smoker in the past.    She mentions to me that consistently upon exertion she has chest tightness and she has to stop and take rest.  This goes to the necks and sometimes to the arm.  At the time of my evaluation, the patient is alert awake oriented and in no distress.  She has significant renal insufficiency in the last creatinine more than 4 months ago was greater than 2.5.  At the time of my evaluation, the patient is alert awake oriented and in no  distress.  Past Medical History:  Diagnosis Date  . Anemia 07/05/2014  . Anxiety and depression 12/14/2008   Qualifier: Diagnosis of  By: Lawson LPN, Julie    . Arthritis   . Asthma   . Atypical chest pain 05/23/2015  . Carotid artery occlusion   . CHF (congestive heart failure) (HCC)   . Chicken pox as a child  . Chronic kidney disease    Bright's Disease at age 14   . COPD (chronic obstructive pulmonary disease) (HCC)   . GERD (gastroesophageal reflux disease)   . H. pylori infection 10/19/2013  . Hyperglycemia 03/14/2016  . Hyperlipidemia   . Hypertension   . Hypothyroidism   . Knee pain, bilateral 12/17/2012  . Left hip pain 05/23/2015  . Low back pain 09/19/2015  . Macular degeneration of both eyes 12/16/2015   Right eye is wet Left Eye is dry Shots every 11 to 12 weeks in right eye at opthamologist office  . Medicare annual wellness visit, subsequent 02/21/2015  . Mumps 72 yrs old  . Preventative health care 03/14/2016  . Pulmonary emboli (HCC) 05/26/2013  . Tremor   . UTI (lower urinary tract infection) 10/19/2013    Past Surgical History:  Procedure Laterality Date  . ABDOMINAL HYSTERECTOMY  1984  . CAROTID ENDARTERECTOMY Right 05/10/07   cea  . CATARACT EXTRACTION Left   . EYE SURGERY     b/l  . knees replaced  2005 and 2011   both knees  . WISDOM TOOTH EXTRACTION      Current Medications: Current Meds  Medication Sig  . acetaminophen (TYLENOL) 500 MG tablet Take 500 mg by mouth every 6 (six) hours as needed.  . budesonide-formoterol (SYMBICORT) 80-4.5 MCG/ACT inhaler Inhale 2 puffs into the lungs 2 (two) times daily.  . calcium citrate-vitamin D (CITRACAL+D) 315-200 MG-UNIT per tablet Take 1 tablet by mouth daily.   . cetirizine (ZYRTEC) 10 MG tablet Take 1 tablet (10 mg total) by mouth daily as needed for allergies.  . Cholecalciferol (D3 MAXIMUM STRENGTH) 5000 UNITS capsule Take 5,000 Units by mouth daily.  . Choline Fenofibrate (FENOFIBRIC ACID) 135 MG CPDR  TAKE 1 CAPSULE BY MOUTH ONCE DAILY  . citalopram (CELEXA) 20 MG tablet Take 2 tablets (40 mg total) by mouth daily.  . clonazePAM (KLONOPIN) 1 MG tablet TAKE 1 TABLET BY MOUTH THREE TIMES DAILY AS NEEDED FOR ANXIETY  . Cyanocobalamin (VITAMIN B-12 PO) Take by mouth.  . diclofenac sodium (VOLTAREN) 1 % GEL Apply 4 g topically 4 (four) times daily.  . esomeprazole (NEXIUM) 40 MG capsule TAKE 1 CAPSULE BY MOUTH ONCE DAILY AT  12  NOON (Patient taking differently: Take 40 mg by mouth daily at 12 noon. )  . ezetimibe (ZETIA) 10 MG tablet Take 1 tablet (10 mg total) by mouth daily.  . FISH OIL-KRILL OIL PO Take 1 tablet by mouth daily.   . gabapentin (NEURONTIN) 100 MG capsule TAKE 1 TO 2 CAPSULES BY MOUTH AT BEDTIME  . levothyroxine (  SYNTHROID, LEVOTHROID) 125 MCG tablet Take 1 tablet (125 mcg total) by mouth daily before breakfast.  . losartan (COZAAR) 25 MG tablet TAKE 1 TABLET BY MOUTH ONCE DAILY  . montelukast (SINGULAIR) 10 MG tablet Take 1 tablet (10 mg total) by mouth at bedtime as needed.  . nitroGLYCERIN (NITROSTAT) 0.4 MG SL tablet Place 1 tablet (0.4 mg total) under the tongue every 5 (five) minutes as needed for chest pain.  . omeprazole (PRILOSEC) 20 MG capsule Take 1 capsule (20 mg total) by mouth 2 (two) times daily before a meal.  . PT/INR Test STRP Check INR every 4 weeks,  PRN Dz:I26.99  . PT/INR Testing Monitor KIT Check INR every 4 weeks prn Dz: l26.99  . warfarin (COUMADIN) 2.5 MG tablet Take 1 tablet by mouth every Monday, Wednesday, and Friday.  . warfarin (COUMADIN) 5 MG tablet Take 1 tablet by mouth every Tuesday, Thursday, Saturday, and Sunday.     Allergies:   Niaspan [niacin er]; Pantoprazole; Bupropion; Penicillins; Statins; Sulfonamide derivatives; Apple; Banana; and Milk-related compounds   Social History   Socioeconomic History  . Marital status: Divorced    Spouse name: Not on file  . Number of children: 2  . Years of education: Not on file  . Highest  education level: Not on file  Occupational History  . Not on file  Social Needs  . Financial resource strain: Not on file  . Food insecurity:    Worry: Not on file    Inability: Not on file  . Transportation needs:    Medical: Not on file    Non-medical: Not on file  Tobacco Use  . Smoking status: Former Smoker    Packs/day: 2.00    Years: 30.00    Pack years: 60.00    Types: Cigarettes    Last attempt to quit: 12/03/1994    Years since quitting: 23.6  . Smokeless tobacco: Never Used  Substance and Sexual Activity  . Alcohol use: Yes    Comment: rarely, 1/month  . Drug use: No  . Sexual activity: Not Currently    Comment: lives with son and friend, no dietary restrictions  Lifestyle  . Physical activity:    Days per week: Not on file    Minutes per session: Not on file  . Stress: Not on file  Relationships  . Social connections:    Talks on phone: Not on file    Gets together: Not on file    Attends religious service: Not on file    Active member of club or organization: Not on file    Attends meetings of clubs or organizations: Not on file    Relationship status: Not on file  Other Topics Concern  . Not on file  Social History Narrative   epworth sleepiness scale = 11 (06/15/2015)     Family History: The patient's family history includes Diabetes in her sister; Heart attack in her maternal grandfather; Heart failure in her mother; Hyperlipidemia in her sister; Hypertension in her mother; Kidney disease in her mother and sister; Stroke in her mother.  ROS:   Please see the history of present illness.    All other systems reviewed and are negative.  EKGs/Labs/Other Studies Reviewed:    The following studies were reviewed today: EKG reveals sinus rhythm with nonspecific ST-T changes.   Recent Labs: 02/28/2018: ALT 11; BUN 35; Creatinine, Ser 2.63; Potassium 5.1; Sodium 140 07/08/2018: Hemoglobin 13.0; Platelets 247.0; TSH 0.84  Recent Lipid Panel      Component  Value Date/Time   CHOL 172 07/08/2018 1511   TRIG 118.0 07/08/2018 1511   HDL 60.30 07/08/2018 1511   CHOLHDL 3 07/08/2018 1511   VLDL 23.6 07/08/2018 1511   LDLCALC 88 07/08/2018 1511   LDLDIRECT 129.0 02/28/2018 1458    Physical Exam:    VS:  BP 128/72 (BP Location: Right Arm, Patient Position: Sitting, Cuff Size: Normal)   Pulse 70   Ht 5' 3" (1.6 m)   Wt 192 lb (87.1 kg)   SpO2 96%   BMI 34.01 kg/m     Wt Readings from Last 3 Encounters:  07/17/18 192 lb (87.1 kg)  07/08/18 193 lb (87.5 kg)  06/14/18 192 lb (87.1 kg)     GEN: Patient is in no acute distress HEENT: Normal NECK: No JVD; No carotid bruits LYMPHATICS: No lymphadenopathy CARDIAC: S1 S2 regular, 2/6 systolic murmur at the apex. RESPIRATORY:  Clear to auscultation without rales, wheezing or rhonchi  ABDOMEN: Soft, non-tender, non-distended MUSCULOSKELETAL:  No edema; No deformity  SKIN: Warm and dry NEUROLOGIC:  Alert and oriented x 3 PSYCHIATRIC:  Normal affect    Signed,  R , MD  07/17/2018 2:17 PM    [facility]   

## 2018-07-18 ENCOUNTER — Encounter (HOSPITAL_COMMUNITY): Payer: Self-pay | Admitting: General Practice

## 2018-07-18 ENCOUNTER — Ambulatory Visit: Payer: Medicare HMO

## 2018-07-18 ENCOUNTER — Encounter (HOSPITAL_COMMUNITY)
Admission: RE | Disposition: A | Discharge status: Home / Self Care | Payer: Self-pay | Source: Ambulatory Visit | Attending: Cardiovascular Disease

## 2018-07-18 ENCOUNTER — Inpatient Hospital Stay (HOSPITAL_BASED_OUTPATIENT_CLINIC_OR_DEPARTMENT_OTHER): Payer: Medicare HMO

## 2018-07-18 ENCOUNTER — Ambulatory Visit (HOSPITAL_COMMUNITY)
Admission: RE | Admit: 2018-07-18 | Discharge: 2018-07-19 | Disposition: A | Discharge status: Home / Self Care | Payer: Medicare HMO | Source: Ambulatory Visit | Attending: Cardiovascular Disease | Admitting: Cardiovascular Disease

## 2018-07-18 ENCOUNTER — Other Ambulatory Visit: Payer: Self-pay

## 2018-07-18 DIAGNOSIS — I10 Essential (primary) hypertension: Secondary | ICD-10-CM

## 2018-07-18 DIAGNOSIS — I420 Dilated cardiomyopathy: Secondary | ICD-10-CM | POA: Diagnosis not present

## 2018-07-18 DIAGNOSIS — E1122 Type 2 diabetes mellitus with diabetic chronic kidney disease: Secondary | ICD-10-CM | POA: Insufficient documentation

## 2018-07-18 DIAGNOSIS — M1990 Unspecified osteoarthritis, unspecified site: Secondary | ICD-10-CM | POA: Diagnosis not present

## 2018-07-18 DIAGNOSIS — Z7901 Long term (current) use of anticoagulants: Secondary | ICD-10-CM | POA: Diagnosis not present

## 2018-07-18 DIAGNOSIS — E039 Hypothyroidism, unspecified: Secondary | ICD-10-CM | POA: Insufficient documentation

## 2018-07-18 DIAGNOSIS — K219 Gastro-esophageal reflux disease without esophagitis: Secondary | ICD-10-CM | POA: Insufficient documentation

## 2018-07-18 DIAGNOSIS — I25119 Atherosclerotic heart disease of native coronary artery with unspecified angina pectoris: Secondary | ICD-10-CM

## 2018-07-18 DIAGNOSIS — I2510 Atherosclerotic heart disease of native coronary artery without angina pectoris: Secondary | ICD-10-CM

## 2018-07-18 DIAGNOSIS — I130 Hypertensive heart and chronic kidney disease with heart failure and stage 1 through stage 4 chronic kidney disease, or unspecified chronic kidney disease: Secondary | ICD-10-CM | POA: Diagnosis not present

## 2018-07-18 DIAGNOSIS — Z833 Family history of diabetes mellitus: Secondary | ICD-10-CM | POA: Insufficient documentation

## 2018-07-18 DIAGNOSIS — Z955 Presence of coronary angioplasty implant and graft: Secondary | ICD-10-CM

## 2018-07-18 DIAGNOSIS — I779 Disorder of arteries and arterioles, unspecified: Secondary | ICD-10-CM | POA: Diagnosis present

## 2018-07-18 DIAGNOSIS — I209 Angina pectoris, unspecified: Secondary | ICD-10-CM

## 2018-07-18 DIAGNOSIS — Z882 Allergy status to sulfonamides status: Secondary | ICD-10-CM | POA: Diagnosis not present

## 2018-07-18 DIAGNOSIS — Z86711 Personal history of pulmonary embolism: Secondary | ICD-10-CM | POA: Diagnosis not present

## 2018-07-18 DIAGNOSIS — J449 Chronic obstructive pulmonary disease, unspecified: Secondary | ICD-10-CM | POA: Diagnosis present

## 2018-07-18 DIAGNOSIS — E782 Mixed hyperlipidemia: Secondary | ICD-10-CM | POA: Insufficient documentation

## 2018-07-18 DIAGNOSIS — I739 Peripheral vascular disease, unspecified: Secondary | ICD-10-CM

## 2018-07-18 DIAGNOSIS — Z79890 Hormone replacement therapy (postmenopausal): Secondary | ICD-10-CM | POA: Diagnosis not present

## 2018-07-18 DIAGNOSIS — Z87891 Personal history of nicotine dependence: Secondary | ICD-10-CM | POA: Diagnosis not present

## 2018-07-18 DIAGNOSIS — Z7951 Long term (current) use of inhaled steroids: Secondary | ICD-10-CM | POA: Diagnosis not present

## 2018-07-18 DIAGNOSIS — R079 Chest pain, unspecified: Secondary | ICD-10-CM

## 2018-07-18 DIAGNOSIS — E1165 Type 2 diabetes mellitus with hyperglycemia: Secondary | ICD-10-CM | POA: Diagnosis not present

## 2018-07-18 DIAGNOSIS — I6521 Occlusion and stenosis of right carotid artery: Secondary | ICD-10-CM

## 2018-07-18 DIAGNOSIS — I509 Heart failure, unspecified: Secondary | ICD-10-CM | POA: Diagnosis not present

## 2018-07-18 DIAGNOSIS — Z880 Allergy status to penicillin: Secondary | ICD-10-CM | POA: Insufficient documentation

## 2018-07-18 DIAGNOSIS — Z823 Family history of stroke: Secondary | ICD-10-CM | POA: Insufficient documentation

## 2018-07-18 DIAGNOSIS — Z90710 Acquired absence of both cervix and uterus: Secondary | ICD-10-CM | POA: Insufficient documentation

## 2018-07-18 DIAGNOSIS — Z8249 Family history of ischemic heart disease and other diseases of the circulatory system: Secondary | ICD-10-CM | POA: Insufficient documentation

## 2018-07-18 DIAGNOSIS — Z888 Allergy status to other drugs, medicaments and biological substances status: Secondary | ICD-10-CM | POA: Insufficient documentation

## 2018-07-18 DIAGNOSIS — Z79899 Other long term (current) drug therapy: Secondary | ICD-10-CM | POA: Insufficient documentation

## 2018-07-18 DIAGNOSIS — N184 Chronic kidney disease, stage 4 (severe): Secondary | ICD-10-CM | POA: Diagnosis not present

## 2018-07-18 DIAGNOSIS — N289 Disorder of kidney and ureter, unspecified: Secondary | ICD-10-CM

## 2018-07-18 HISTORY — DX: Atherosclerotic heart disease of native coronary artery without angina pectoris: I25.10

## 2018-07-18 HISTORY — DX: Pneumonia, unspecified organism: J18.9

## 2018-07-18 HISTORY — PX: CORONARY ANGIOPLASTY WITH STENT PLACEMENT: SHX49

## 2018-07-18 HISTORY — PX: LEFT HEART CATH AND CORONARY ANGIOGRAPHY: CATH118249

## 2018-07-18 HISTORY — DX: Sleep apnea, unspecified: G47.30

## 2018-07-18 HISTORY — DX: Chest pain, unspecified: R07.9

## 2018-07-18 HISTORY — DX: Cerebral infarction, unspecified: I63.9

## 2018-07-18 HISTORY — PX: CORONARY STENT INTERVENTION: CATH118234

## 2018-07-18 HISTORY — DX: Inflammatory liver disease, unspecified: K75.9

## 2018-07-18 HISTORY — PX: INTRAVASCULAR PRESSURE WIRE/FFR STUDY: CATH118243

## 2018-07-18 LAB — CBC WITH DIFFERENTIAL/PLATELET
Basophils Absolute: 0.1 10*3/uL (ref 0.0–0.2)
Basos: 1 %
EOS (ABSOLUTE): 0.2 10*3/uL (ref 0.0–0.4)
EOS: 2 %
HEMATOCRIT: 37.2 % (ref 34.0–46.6)
Hemoglobin: 12.3 g/dL (ref 11.1–15.9)
Immature Grans (Abs): 0 10*3/uL (ref 0.0–0.1)
Immature Granulocytes: 0 %
Lymphocytes Absolute: 2 10*3/uL (ref 0.7–3.1)
Lymphs: 26 %
MCH: 28.9 pg (ref 26.6–33.0)
MCHC: 33.1 g/dL (ref 31.5–35.7)
MCV: 87 fL (ref 79–97)
Monocytes Absolute: 0.6 10*3/uL (ref 0.1–0.9)
Monocytes: 8 %
Neutrophils Absolute: 4.8 10*3/uL (ref 1.4–7.0)
Neutrophils: 63 %
Platelets: 275 10*3/uL (ref 150–450)
RBC: 4.26 x10E6/uL (ref 3.77–5.28)
RDW: 13.2 % (ref 12.3–15.4)
WBC: 7.6 10*3/uL (ref 3.4–10.8)

## 2018-07-18 LAB — BASIC METABOLIC PANEL
ANION GAP: 8 (ref 5–15)
BUN/Creatinine Ratio: 17 (ref 12–28)
BUN: 37 mg/dL — ABNORMAL HIGH (ref 8–27)
BUN: 37 mg/dL — ABNORMAL HIGH (ref 8–23)
CO2: 24 mmol/L (ref 20–29)
CO2: 27 mmol/L (ref 22–32)
Calcium: 9.8 mg/dL (ref 8.7–10.3)
Calcium: 9.3 mg/dL (ref 8.9–10.3)
Chloride: 103 mmol/L (ref 96–106)
Chloride: 106 mmol/L (ref 98–111)
Creatinine, Ser: 2.22 mg/dL — ABNORMAL HIGH (ref 0.57–1.00)
Creatinine, Ser: 2.1 mg/dL — ABNORMAL HIGH (ref 0.44–1.00)
GFR calc Af Amer: 25 mL/min/{1.73_m2} — ABNORMAL LOW (ref >59)
GFR calc non Af Amer: 22 mL/min/{1.73_m2} — ABNORMAL LOW (ref >59)
GFR, EST AFRICAN AMERICAN: 27 mL/min — AB (ref >60)
GFR, EST NON AFRICAN AMERICAN: 23 mL/min — AB (ref >60)
Glucose, Bld: 101 mg/dL — ABNORMAL HIGH (ref 70–99)
Glucose: 58 mg/dL — ABNORMAL LOW (ref 65–99)
Potassium: 4.7 mmol/L (ref 3.5–5.2)
Potassium: 5 mmol/L (ref 3.5–5.1)
Sodium: 140 mmol/L (ref 134–144)
Sodium: 141 mmol/L (ref 135–145)

## 2018-07-18 LAB — ECHOCARDIOGRAM COMPLETE
Height: 63 in
Weight: 3056 oz

## 2018-07-18 LAB — POCT ACTIVATED CLOTTING TIME
ACTIVATED CLOTTING TIME: 263 s
Activated Clotting Time: 378 seconds

## 2018-07-18 LAB — PROTIME-INR
INR: 1.79
Prothrombin Time: 20.6 seconds — ABNORMAL HIGH (ref 11.4–15.2)

## 2018-07-18 SURGERY — LEFT HEART CATH AND CORONARY ANGIOGRAPHY
Anesthesia: LOCAL

## 2018-07-18 MED ORDER — SODIUM CHLORIDE 0.9 % WEIGHT BASED INFUSION: 1.0000 mL/kg/h | INTRAVENOUS | Status: DC

## 2018-07-18 MED ORDER — MORPHINE SULFATE (PF) 2 MG/ML IV SOLN: 2.0000 mg | INTRAVENOUS | Status: DC | PRN

## 2018-07-18 MED ORDER — CITALOPRAM HYDROBROMIDE 40 MG PO TABS
40.0000 mg | ORAL_TABLET | Freq: Every day | ORAL | Status: DC
  Administered 2018-07-18 – 2018-07-19 (×2): 40 mg via ORAL
  Filled 2018-07-18: qty 2
  Filled 2018-07-18 (×2): qty 1
  Filled 2018-07-18: qty 2

## 2018-07-18 MED ORDER — SODIUM BICARBONATE 8.4 % IV SOLN
INTRAVENOUS | Status: DC
  Filled 2018-07-18: qty 1000

## 2018-07-18 MED ORDER — LABETALOL HCL 5 MG/ML IV SOLN: 10.0000 mg | INTRAVENOUS | Status: AC | PRN

## 2018-07-18 MED ORDER — FENTANYL CITRATE (PF) 100 MCG/2ML IJ SOLN
INTRAMUSCULAR | Status: AC
  Filled 2018-07-18: qty 2

## 2018-07-18 MED ORDER — ASPIRIN 81 MG PO CHEW
81.0000 mg | CHEWABLE_TABLET | ORAL | Status: AC
  Administered 2018-07-18: 81 mg via ORAL
  Filled 2018-07-18: qty 1

## 2018-07-18 MED ORDER — SODIUM CHLORIDE 0.9 % IV SOLN: 250.0000 mL | INTRAVENOUS | Status: DC | PRN

## 2018-07-18 MED ORDER — VERAPAMIL HCL 2.5 MG/ML IV SOLN
INTRAVENOUS | Status: AC
  Filled 2018-07-18: qty 2

## 2018-07-18 MED ORDER — FAMOTIDINE IN NACL 20-0.9 MG/50ML-% IV SOLN
INTRAVENOUS | Status: AC | PRN
  Administered 2018-07-18: 20 mg via INTRAVENOUS

## 2018-07-18 MED ORDER — VERAPAMIL HCL 2.5 MG/ML IV SOLN
INTRAVENOUS | Status: DC | PRN
  Administered 2018-07-18: 10 mL via INTRA_ARTERIAL

## 2018-07-18 MED ORDER — SODIUM CHLORIDE 0.9% FLUSH: 3.0000 mL | INTRAVENOUS | Status: DC | PRN

## 2018-07-18 MED ORDER — FAMOTIDINE IN NACL 20-0.9 MG/50ML-% IV SOLN
INTRAVENOUS | Status: AC
  Filled 2018-07-18: qty 50

## 2018-07-18 MED ORDER — SODIUM CHLORIDE 0.9% FLUSH: 3.0000 mL | Freq: Two times a day (BID) | INTRAVENOUS | Status: DC

## 2018-07-18 MED ORDER — ANGIOPLASTY BOOK
Freq: Once | Status: AC
  Administered 2018-07-18: 21:00:00 1
  Filled 2018-07-18: qty 1

## 2018-07-18 MED ORDER — LOSARTAN POTASSIUM 25 MG PO TABS
25.0000 mg | ORAL_TABLET | Freq: Every day | ORAL | Status: DC
  Administered 2018-07-18 – 2018-07-19 (×2): 25 mg via ORAL
  Filled 2018-07-18 (×2): qty 1

## 2018-07-18 MED ORDER — ADENOSINE 12 MG/4ML IV SOLN
INTRAVENOUS | Status: AC
  Filled 2018-07-18: qty 4

## 2018-07-18 MED ORDER — FENTANYL CITRATE (PF) 100 MCG/2ML IJ SOLN
INTRAMUSCULAR | Status: DC | PRN
  Administered 2018-07-18: 25 ug via INTRAVENOUS

## 2018-07-18 MED ORDER — HYDRALAZINE HCL 20 MG/ML IJ SOLN: 5.0000 mg | INTRAMUSCULAR | Status: AC | PRN

## 2018-07-18 MED ORDER — HEPARIN (PORCINE) IN NACL 1000-0.9 UT/500ML-% IV SOLN
INTRAVENOUS | Status: DC | PRN
  Administered 2018-07-18 (×2): 500 mL

## 2018-07-18 MED ORDER — CLOPIDOGREL BISULFATE 300 MG PO TABS
ORAL_TABLET | ORAL | Status: DC | PRN
  Administered 2018-07-18: 600 mg via ORAL

## 2018-07-18 MED ORDER — SODIUM CHLORIDE 0.9 % WEIGHT BASED INFUSION
3.0000 mL/kg/h | INTRAVENOUS | Status: DC
  Administered 2018-07-18: 3 mL/kg/h via INTRAVENOUS

## 2018-07-18 MED ORDER — HEPARIN SODIUM (PORCINE) 1000 UNIT/ML IJ SOLN
INTRAMUSCULAR | Status: DC | PRN
  Administered 2018-07-18: 4500 [IU] via INTRAVENOUS
  Administered 2018-07-18: 6000 [IU] via INTRAVENOUS
  Administered 2018-07-18: 2000 [IU] via INTRAVENOUS

## 2018-07-18 MED ORDER — PANTOPRAZOLE SODIUM 40 MG PO TBEC: 40.0000 mg | DELAYED_RELEASE_TABLET | Freq: Every day | ORAL | Status: DC

## 2018-07-18 MED ORDER — ACETAMINOPHEN 325 MG PO TABS
650.0000 mg | ORAL_TABLET | ORAL | Status: DC | PRN
  Administered 2018-07-18 – 2018-07-19 (×2): 650 mg via ORAL
  Filled 2018-07-18 (×2): qty 2

## 2018-07-18 MED ORDER — MORPHINE SULFATE (PF) 2 MG/ML IV SOLN: 1.0000 mg | INTRAVENOUS | Status: DC | PRN

## 2018-07-18 MED ORDER — SODIUM CHLORIDE 0.9 % IV SOLN
INTRAVENOUS | Status: AC
  Administered 2018-07-18: 15:00:00 via INTRAVENOUS

## 2018-07-18 MED ORDER — ONDANSETRON HCL 4 MG/2ML IJ SOLN: 4.0000 mg | Freq: Four times a day (QID) | INTRAMUSCULAR | Status: DC | PRN

## 2018-07-18 MED ORDER — MIDAZOLAM HCL 2 MG/2ML IJ SOLN
INTRAMUSCULAR | Status: DC | PRN
  Administered 2018-07-18: 1 mg via INTRAVENOUS

## 2018-07-18 MED ORDER — CLOPIDOGREL BISULFATE 300 MG PO TABS
ORAL_TABLET | ORAL | Status: AC
  Filled 2018-07-18: qty 2

## 2018-07-18 MED ORDER — MORPHINE SULFATE (PF) 10 MG/ML IV SOLN: 2.0000 mg | INTRAVENOUS | Status: DC | PRN

## 2018-07-18 MED ORDER — HEPARIN (PORCINE) IN NACL 1000-0.9 UT/500ML-% IV SOLN
INTRAVENOUS | Status: AC
  Filled 2018-07-18: qty 1000

## 2018-07-18 MED ORDER — ASPIRIN EC 81 MG PO TBEC: 81.0000 mg | DELAYED_RELEASE_TABLET | Freq: Every day | ORAL | Status: DC

## 2018-07-18 MED ORDER — LIDOCAINE HCL (PF) 1 % IJ SOLN
INTRAMUSCULAR | Status: DC | PRN
  Administered 2018-07-18: 2 mL

## 2018-07-18 MED ORDER — CLOPIDOGREL BISULFATE 75 MG PO TABS
75.0000 mg | ORAL_TABLET | Freq: Every day | ORAL | Status: DC
  Administered 2018-07-19: 10:00:00 75 mg via ORAL
  Filled 2018-07-18: qty 1

## 2018-07-18 MED ORDER — LIDOCAINE HCL (PF) 1 % IJ SOLN
INTRAMUSCULAR | Status: AC
  Filled 2018-07-18: qty 30

## 2018-07-18 MED ORDER — SODIUM BICARBONATE BOLUS VIA INFUSION
INTRAVENOUS | Status: DC
  Filled 2018-07-18: qty 1

## 2018-07-18 MED ORDER — IOHEXOL 350 MG/ML SOLN
INTRAVENOUS | Status: DC | PRN
  Administered 2018-07-18: 90 mL via INTRA_ARTERIAL

## 2018-07-18 MED ORDER — ASPIRIN 81 MG PO CHEW
81.0000 mg | CHEWABLE_TABLET | Freq: Every day | ORAL | Status: DC
  Administered 2018-07-19: 81 mg via ORAL
  Filled 2018-07-18: qty 1

## 2018-07-18 MED ORDER — ADENOSINE (DIAGNOSTIC) 140MCG/KG/MIN
INTRAVENOUS | Status: DC | PRN
  Administered 2018-07-18: 140 ug/kg/min via INTRAVENOUS

## 2018-07-18 MED ORDER — LEVOTHYROXINE SODIUM 125 MCG PO TABS
125.0000 ug | ORAL_TABLET | Freq: Every day | ORAL | Status: DC
  Administered 2018-07-19: 125 ug via ORAL
  Filled 2018-07-18: qty 1

## 2018-07-18 MED ORDER — MIDAZOLAM HCL 2 MG/2ML IJ SOLN
INTRAMUSCULAR | Status: AC
  Filled 2018-07-18: qty 2

## 2018-07-18 MED ORDER — ADENOSINE 12 MG/4ML IV SOLN
INTRAVENOUS | Status: AC
  Filled 2018-07-18: qty 16

## 2018-07-18 SURGICAL SUPPLY — 19 items
BALLN SAPPHIRE 2.0X12 (BALLOONS) ×2
BALLN SAPPHIRE ~~LOC~~ 2.5X15 (BALLOONS) ×2 IMPLANT
BALLOON SAPPHIRE 2.0X12 (BALLOONS) ×1 IMPLANT
CATH MICROCATH NAVVUS (MICROCATHETER) ×1 IMPLANT
CATH OPTITORQUE TIG 4.0 5F (CATHETERS) ×2 IMPLANT
CATH VISTA GUIDE 6FR XBLAD3.5 (CATHETERS) ×2 IMPLANT
DEVICE RAD COMP TR BAND LRG (VASCULAR PRODUCTS) ×2 IMPLANT
GLIDESHEATH SLEND A-KIT 6F 22G (SHEATH) ×2 IMPLANT
GUIDEWIRE INQWIRE 1.5J.035X260 (WIRE) ×1 IMPLANT
INQWIRE 1.5J .035X260CM (WIRE) ×2
KIT ENCORE 26 ADVANTAGE (KITS) ×2 IMPLANT
KIT HEART LEFT (KITS) ×2 IMPLANT
MICROCATHETER NAVVUS (MICROCATHETER) ×2
PACK CARDIAC CATHETERIZATION (CUSTOM PROCEDURE TRAY) ×2 IMPLANT
STENT SYNERGY DES 2.25X20 (Permanent Stent) ×2 IMPLANT
TRANSDUCER W/STOPCOCK (MISCELLANEOUS) ×2 IMPLANT
TUBING CIL FLEX 10 FLL-RA (TUBING) ×2 IMPLANT
WIRE ASAHI PROWATER 180CM (WIRE) ×2 IMPLANT
WIRE HI TORQ VERSACORE-J 145CM (WIRE) ×2 IMPLANT

## 2018-07-18 NOTE — Interval H&P Note (Signed)
Cath Lab Visit (complete for each Cath Lab visit)  Clinical Evaluation Leading to the Procedure:   ACS: No.  Non-ACS:    Anginal Classification: CCS II  Anti-ischemic medical therapy: Minimal Therapy (1 class of medications)  Non-Invasive Test Results: No non-invasive testing performed  Prior CABG: No previous CABG      History and Physical Interval Note:  07/18/2018 1:08 PM  Emily Mitchell  has presented today for surgery, with the diagnosis of angina  The various methods of treatment have been discussed with the patient and family. After consideration of risks, benefits and other options for treatment, the patient has consented to  Procedure(s): LEFT HEART CATH AND CORONARY ANGIOGRAPHY (N/A) as a surgical intervention .  The patient's history has been reviewed, patient examined, no change in status, stable for surgery.  I have reviewed the patient's chart and labs.  Questions were answered to the patient's satisfaction.     Quay Burow

## 2018-07-18 NOTE — Progress Notes (Signed)
  Echocardiogram 2D Echocardiogram has been performed.  Emily Mitchell 07/18/2018, 11:36 AM

## 2018-07-18 NOTE — Progress Notes (Signed)
TR BAND REMOVAL  LOCATION:    right radial  DEFLATED PER PROTOCOL:    Yes.    TIME BAND OFF / DRESSING APPLIED:    19:40   SITE UPON ARRIVAL:    Level 0  SITE AFTER BAND REMOVAL:    Level 1 Bruise  CIRCULATION SENSATION AND MOVEMENT:    Within Normal Limits   Yes.    COMMENTS:   Post TR band instructions given. Pt tolerated well.

## 2018-07-19 ENCOUNTER — Other Ambulatory Visit: Payer: Self-pay

## 2018-07-19 ENCOUNTER — Telehealth: Payer: Self-pay | Admitting: *Deleted

## 2018-07-19 ENCOUNTER — Encounter: Payer: Self-pay | Admitting: Family Medicine

## 2018-07-19 ENCOUNTER — Encounter (HOSPITAL_COMMUNITY): Payer: Self-pay | Admitting: Cardiovascular Disease

## 2018-07-19 ENCOUNTER — Telehealth: Payer: Self-pay | Admitting: Physician Assistant

## 2018-07-19 DIAGNOSIS — I6521 Occlusion and stenosis of right carotid artery: Secondary | ICD-10-CM

## 2018-07-19 DIAGNOSIS — Z87891 Personal history of nicotine dependence: Secondary | ICD-10-CM | POA: Diagnosis not present

## 2018-07-19 DIAGNOSIS — I2510 Atherosclerotic heart disease of native coronary artery without angina pectoris: Secondary | ICD-10-CM

## 2018-07-19 DIAGNOSIS — E1122 Type 2 diabetes mellitus with diabetic chronic kidney disease: Secondary | ICD-10-CM | POA: Diagnosis not present

## 2018-07-19 DIAGNOSIS — I209 Angina pectoris, unspecified: Secondary | ICD-10-CM | POA: Diagnosis not present

## 2018-07-19 DIAGNOSIS — N184 Chronic kidney disease, stage 4 (severe): Secondary | ICD-10-CM | POA: Diagnosis not present

## 2018-07-19 DIAGNOSIS — E039 Hypothyroidism, unspecified: Secondary | ICD-10-CM | POA: Diagnosis not present

## 2018-07-19 DIAGNOSIS — I25119 Atherosclerotic heart disease of native coronary artery with unspecified angina pectoris: Secondary | ICD-10-CM | POA: Diagnosis not present

## 2018-07-19 DIAGNOSIS — E1165 Type 2 diabetes mellitus with hyperglycemia: Secondary | ICD-10-CM | POA: Diagnosis not present

## 2018-07-19 DIAGNOSIS — I509 Heart failure, unspecified: Secondary | ICD-10-CM | POA: Diagnosis not present

## 2018-07-19 DIAGNOSIS — E782 Mixed hyperlipidemia: Secondary | ICD-10-CM | POA: Diagnosis not present

## 2018-07-19 DIAGNOSIS — I130 Hypertensive heart and chronic kidney disease with heart failure and stage 1 through stage 4 chronic kidney disease, or unspecified chronic kidney disease: Secondary | ICD-10-CM | POA: Diagnosis not present

## 2018-07-19 HISTORY — DX: Atherosclerotic heart disease of native coronary artery without angina pectoris: I25.10

## 2018-07-19 LAB — CBC
HCT: 31.3 % — ABNORMAL LOW (ref 36.0–46.0)
Hemoglobin: 9.8 g/dL — ABNORMAL LOW (ref 12.0–15.0)
MCH: 28.4 pg (ref 26.0–34.0)
MCHC: 31.3 g/dL (ref 30.0–36.0)
MCV: 90.7 fL (ref 80.0–100.0)
Platelets: 189 10*3/uL (ref 150–400)
RBC: 3.45 MIL/uL — ABNORMAL LOW (ref 3.87–5.11)
RDW: 13.2 % (ref 11.5–15.5)
WBC: 5.6 10*3/uL (ref 4.0–10.5)
nRBC: 0 % (ref 0.0–0.2)

## 2018-07-19 LAB — BASIC METABOLIC PANEL
Anion gap: 9 (ref 5–15)
BUN: 29 mg/dL — ABNORMAL HIGH (ref 8–23)
CO2: 23 mmol/L (ref 22–32)
Calcium: 8.4 mg/dL — ABNORMAL LOW (ref 8.9–10.3)
Chloride: 108 mmol/L (ref 98–111)
Creatinine, Ser: 1.96 mg/dL — ABNORMAL HIGH (ref 0.44–1.00)
GFR calc Af Amer: 29 mL/min — ABNORMAL LOW (ref >60)
GFR calc non Af Amer: 25 mL/min — ABNORMAL LOW (ref >60)
Glucose, Bld: 106 mg/dL — ABNORMAL HIGH (ref 70–99)
Potassium: 4.2 mmol/L (ref 3.5–5.1)
Sodium: 140 mmol/L (ref 135–145)

## 2018-07-19 MED ORDER — CLOPIDOGREL BISULFATE 75 MG PO TABS: 75.0000 mg | ORAL_TABLET | Freq: Every day | ORAL | 11 refills | Status: DC

## 2018-07-19 MED ORDER — DEXLANSOPRAZOLE 30 MG PO CPDR: 30.0000 mg | DELAYED_RELEASE_CAPSULE | Freq: Every day | ORAL | 1 refills | Status: DC

## 2018-07-19 MED FILL — CLOPIDOGREL 75 MG TABLET: 75 | 30 days supply | Qty: 30 | Fill #0

## 2018-07-19 NOTE — Telephone Encounter (Signed)
Pt and son, Rodman Key, both have been made aware of possible med interaction with Plavix and Celexa and that a note is being sent to Dr. Randel Pigg to consider an alternative.  Pt also has been made aware of her appt with [facility], Dr. Trula Slade, Monday, 07/22/18.  They both thanked me for the call.

## 2018-07-19 NOTE — Progress Notes (Signed)
CARDIAC REHAB PHASE I   PRE:  Rate/Rhythm: 83 SR  BP:  Sitting: 106/86      SaO2: 98 RA  MODE:  Ambulation: 500 ft   POST:  Rate/Rhythm: 81 SR  BP:  Sitting: 121/51    SaO2: 98 RA   Pt ambulated 532ft in hallway assist of one with front wheel walker. Pt denies CP, does c/o SOB. Pt educated on importance of Plavix, ASA, and NTG. Stent card at bedside. Pt given heart healthy diet. Reviewed restrictions and exercise guidelines. Strongly encouraged walker use at home, as pt states has had some falls recently. Will refer to [facility] [place].   [id number] Rufina Falco, RN BSN 07/19/2018 8:47 AM

## 2018-07-19 NOTE — Telephone Encounter (Signed)
-----   Message from Charlie Pitter, [place] sent at 07/19/2018 10:11 AM EST ----- Regarding: Referral to vascular Emily Mitchell, I am DC this patient from the hospital today for Dr. Burt Knack. She is a patient of Dr. Geraldo Pitter. It looks like PCP sent her to [place] for eval of carotid disease but she actually was remotely established with Dr. [name] at [facility]. He Mitchell since retired but she really needs to follow back up with their office instead of [place] given her history of endarterectomy. She came in for a cath and got a coronary stent, totally unrelated to the above. Can you please help facilitate ASAP referral back to their office for severe carotid disease? Thanks [employer]

## 2018-07-19 NOTE — Discharge Summary (Addendum)
Discharge Summary    Patient ID: Emily Mitchell,  MRN: [id number], DOB/AGE: [birth year] 70 y.o.  Admit date: 07/18/2018 Discharge date: 07/19/2018  Primary Care Provider: Penni Homans A Primary Cardiologist: Jenean Lindau, MD Primary Electrophysiologist:  None  Discharge Diagnoses    Principal Problem:   Angina pectoris [facility]) Active Problems:   CKD (chronic kidney disease), stage IV ([place])   Hyperlipidemia, mixed   Carotid artery disease ([place])   Essential hypertension   COPD with asthma ([place])   Chest pain   CAD in native artery   Diagnostic Studies/Procedures    CARDIAC CATHETERIZATION / LAD PCI DES/ FFR    History obtained from chart review.  71 year old female with history of carotid artery disease, hypertension, hyperlipidemia and diabetes seen by Dr. Geraldo Pitter for preoperative clearance before carotid endarterectomy.  She has chronic renal insufficiency as well.  She was referred for diagnostic coronary angiography because of typical exertional angina and shortness of breath.   PROCEDURE DESCRIPTION:   The patient was brought to the second floor [facility] cath lab in the postabsorptive state.  She was premedicated with IV Versed and fentanyl.  Her right wristwas prepped and shaved in usual sterile fashion. Xylocaine 1% was used  for local anesthesia. A 6 French sheath was inserted into the right radial artery using standard Seldinger technique.  She received 4500 units of heparin intravenously.  Isovue dye was used for the entirety of the case.  5 [place] TIG catheter was used for coronary angiography and obtain left heart pressures.  Retrograde aortic and left ventricular and pullback pressures were recorded.  The patient received radial cocktail via the SideArm sheath.  Patient was given an additional 5000 is of heparin totaling 9500 units.  FFR was performed of the mid LAD using an ACIST device the result of which was 0.63 suggesting that the mid  LAD lesion was physiologically significant.  Using a 6 [place] XB LAD 3.5 cm guide catheter along with a 0.14 pro-water guidewire and a 2 mm x 12 mm balloon the mid LAD lesion was predilated.  It was stented with a 2.25 mm x 20 mm long Synergy drug-eluting stent and postdilated with a 2.5 mm x 15 mm long noncompliant balloon up to 16 atm resulting reduction of 60% hypodense mid LAD lesion to 0% residual with excellent flow.  The patient tolerated procedure well.  The guidewire and catheter were removed.  The sheath was removed and a TR band was placed on the right wrist to achieve patent hemostasis.  The patient left the lab in stable condition.  She did receive an additional 2000 use of heparin at the end of the case.  He also received 600 mg of p.o. Plavix and 20 mg of IV Pepcid.   IMPRESSION: Successful FFR guided mid LAD PCI and drug-eluting stenting in the setting of exertional angina in a patient who is being preoperatively evaluated for carotid endarterectomy.  She does have a history of pulmonary embolism in the past and was on Coumadin with an INR of 1.8 today after Coumadin was held.  She will be treated with triple therapy for 1 month after which her aspirin can be discontinued.  She left the lab in stable condition.  She can be discharged home tomorrow.  Quay Burow. MD, [facility] 07/18/2018 2:17 PM   _____________     History of Present Illness     Emily Mitchell is a 72 y.o. 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  She was referred to Dr. Geraldo Pitter by Dr. Charlett Blake for evaluation of atherosclerotic disease. She is post right carotid endarterectomy in the remote past that was followed by Dr. [name] up until around 2015. Recent carotid duplex 12/10  showed severe bilateral carotid disease with occluded RICA (known from 2015), and 70-99% in the L with noncritical velocities. Referral to vascular surgery was recommended. She was sent to [facility] for evaluation of this per phone notes. It sounds like at that visit she mentioned chest tightness concerning for angina so the office visit addressed concern for CAD. She was started on ASA and it was recommended she undergo cath.   Hospital Course    1. CAD - cath yesterday showed 60% prox-mid LAD but FFR was 0.63 suggesting physiologic significance. She was started on ASA/Plavix. PPIs were changed to [place] given interaction with Plavix (h/o mouth sores with Protonix so could not use that). Short term rx sent into regular pharmacy, not on formulary at [facility]. She was provided Plavix rx through our transitions of care program who will relay refills to usual pharmacy upon dc. NTG is avoided due to h/o severe carotid disease. Will add to allergies so healthcare team is aware in the future (not a true allergy, just do not want to precipitate cerebral hypoperfusion).  2. Carotid artery disease - reviewed with Dr. Burt Knack. Given that patient was established with [facility], will request referral be placed back with their team. Sent message to office to help facilitate ASAP.  3. HTN - controlled.  4. Hyperlipidemia - hx of statin intolerance per chart. Continued on home regimen. OP LDL was 88 on 07/08/18. Consider discussion regarding PCSK9 as outpatient.  5. History of PE - this was a remote diagnosis with one prior event per patient. Pulmonology has previously seen patient and did not see an indication to continue long term anticoagulation but recommended discontinuation be considered by primary care. It appears this had been continued in setting of carotid disease. Patient now requires ASA/Plavix for her CAD. Dr. Burt Knack feels it is OK to stop Coumadin at this time.  6. CKD IV - will need OP follow-up. IM notes  indicate followed by [facility]. Appeared consistent with prior baseline post-cath. Uses Voltaren gel - not sure if this has previously been discussed with PCP/nephrology given CKD so advised patient to reach out to prescribing provider to clarify use.  7. Anemia -  Hgb was 12.3 prior to cath, but 9.8 after fluids and procedure. No bleeding noted post-procedurally. Dr. Burt Knack does not feel this needs to be acutely followed but we do recommend CBC at time of office visit with Dr. Geraldo Pitter, can be ordered at that time.  Dr. Burt Knack has seen and examined the patient today and feels he is stable for discharge. F/u has been arranged in [place] office due to no appt availability in [place] in timeframe needed. _____________   Vital Signs. BP (!) 132/52   Pulse (!) 52   Temp 98.2 F (36.8 C) (Oral)   Resp 18   Ht _0  (1.6 m)   Wt 87 kg   SpO2 100%   BMI 33.98 kg/m  General: Well developed, well nourished F, in no acute distress. Head: Normocephalic, atraumatic, sclera non-icteric, no xanthomas,  nares are without discharge. Neck: Negative for carotid bruits. JVP not elevated. Lungs: Clear bilaterally to auscultation without wheezes, rales, or rhonchi. Breathing is unlabored. Heart: RRR S1 S2 without murmurs, rubs, or gallops.  Abdomen: Soft, non-tender, non-distended with normoactive bowel sounds. No rebound/guarding. Extremities: No clubbing or cyanosis. No edema. Distal pedal pulses are 2+ and equal bilaterally. Right forearm ecchymosis, no bruit or hematoma. Neuro: Alert and oriented X 3. Moves all extremities spontaneously. Psych:  Responds to questions appropriately with a normal affect.   Labs & Radiologic Studies    CBC Recent Labs    07/17/18 1508 07/19/18 0203  WBC 7.6 5.6  NEUTROABS 4.8  --   HGB 12.3 9.8*  HCT 37.2 31.3*  MCV 87 90.7  PLT 275 637   Basic Metabolic Panel Recent Labs    07/18/18 0946 07/19/18 0203  NA 141 140  K 5.0 4.2  CL 106 108  CO2 27  23  GLUCOSE 101* 106*  BUN 37* 29*  CREATININE 2.10* 1.96*  CALCIUM 9.3 8.4*   _____________  Dg Chest 2 View  Result Date: 07/18/2018 CLINICAL DATA:  Angina pectoris, cough and chest pain for 1 month, history hypertension, CHF, asthma, COPD EXAM: CHEST - 2 VIEW COMPARISON:  12/07/2017 FINDINGS: Upper normal heart size. Mediastinal contours and pulmonary vascularity normal. Atherosclerotic calcification aorta. Calcification projecting over the LEFT lung base on the PA view appears to be within the LEFT breast on lateral view. Lungs clear. No pulmonary infiltrate, pleural effusion or pneumothorax. Mild diffuse osseous demineralization. IMPRESSION: No acute abnormalities. Electronically Signed   By: Lavonia [facility] M.D.   On: 07/18/2018 08:23   US Carotid Bilateral  Result Date: 07/09/2018 CLINICAL DATA:  72 year old female with carotid artery disease. History of remote right carotid endarterectomy in 2008. EXAM: BILATERAL CAROTID DUPLEX ULTRASOUND TECHNIQUE: Pearline Cables scale imaging, color Doppler and duplex ultrasound were performed of bilateral carotid and vertebral arteries in the neck. COMPARISON:  Prior thyroid ultrasound 03/17/2009 FINDINGS: Criteria: Quantification of carotid stenosis is based on velocity parameters that correlate the residual internal carotid diameter with NASCET-based stenosis levels, using the diameter of the distal internal carotid lumen as the denominator for stenosis measurement. The following velocity measurements were obtained: RIGHT ICA: 81/0 cm/sec CCA: 85/8 cm/sec SYSTOLIC ICA/CCA RATIO:  0.9 ECA:  212 cm/sec LEFT ICA: 324/79 cm/sec CCA: 850/27 cm/sec SYSTOLIC ICA/CCA RATIO:  2.4 ECA:  203 cm/sec RIGHT CAROTID ARTERY: Diffusely abnormal. There is extensive bulky heterogeneous atherosclerotic plaque throughout the internal carotid artery. Flow is slow and monophasic with absent diastolic flow. There is a thin trickle of color flow visible consistent with a string sign. The  absence of diastolic flow raises concern for more distal occlusion. RIGHT VERTEBRAL ARTERY:  Patent with antegrade flow. LEFT CAROTID ARTERY: Heterogeneous and irregular atherosclerotic plaque is present within the internal carotid artery. There is significant elevation of the peak systolic velocity to 741 centimeters/second. Aliasing, spectral broadening and high velocity jetting are noted. LEFT VERTEBRAL ARTERY:  Patent with antegrade flow. IMPRESSION: 1. Positive for a string sign (critical stenosis) in the right internal carotid artery. However, the waveform is abnormal with absence of diastolic flow. This suggests complete occlusion of the internal carotid artery distal to the imaged portion. This occlusion may be at the skull base, or intracranial. 2. High-grade (70-99%) stenosis in the proximal left internal carotid artery secondary to bulky, heterogeneous and irregular atherosclerotic plaque. Of note, the degree of stenosis may be somewhat overestimated by compensatory increased peak  systolic velocity given the suspected occlusion on the right. 3. Both vertebral arteries are patent with normal antegrade flow. 4. Consider referral to vascular surgery for further evaluation and management. These results will be called to the ordering clinician or representative by the Radiologist Assistant, and communication documented in the PACS or zVision Dashboard. Signed, Criselda Peaches, MD, [facility] Radiology Electronically Signed   By: Jacqulynn Cadet M.D.   On: 07/09/2018 15:46   Disposition   Pt is being discharged home today in good condition.  Follow-up Plans & Appointments    Follow-up Information    Revankar, Reita Cliche, MD Follow up.   Specialty:  Cardiology Why:  [facility] - see appointment info below. Note, this will be in our [place] location not the [facility] office (due to appointment availability) Contact information: [place] [address] [phone]          Discharge Instructions    Amb Referral to Cardiac Rehabilitation   Complete by:  As directed    Will send Cardiac Rehab Phase II referral to [place]   Diagnosis:  Coronary Stents   Diet - low sodium heart healthy   Complete by:  As directed    Discharge instructions   Complete by:  As directed    No driving for 2 days. No lifting over 5 lbs for 1 week. No sexual activity for 1 week.Keep procedure site clean & dry. If you notice increased pain, swelling, bleeding or pus, call/return!  You may shower, but no soaking baths/hot tubs/pools for 1 week.   Your cardiologist does not recommend you taking any Nitroglycerin as it can decrease the blood flow to your brain since you have carotid disease. Our team actually feels you'd be best served having your carotid disease re-evaluated by the vascular surgery team you saw in the past. We placed a referral request so you should hear back shortly.  You should get your blood count checked at your follow-up with Dr. Geraldo Pitter. Please discuss this with him. If you notice any bleeding such as blood in stool, black tarry stools, blood in urine, nosebleeds or any other unusual bleeding, call your doctor immediately.  Your Coumadin/warfarin was stopped.  Some studies suggest Prilosec/Omeprazole/Nexium/Esomeprazole interacts with Plavix. We changed your Prilosec/Omeprazole to Dexilant (dexlansoprazole) for less chance of interaction. This prescription was sent into your regular pharmacy as we do not have this on stock with the medication delivered to your bedside.  Patients with kidney disease should generally stay away from medicines like diclofenac, Voltaren, ibuprofen, Advil, Motrin, naproxen, and Aleve due to risk of worsening kidney function. Your medicine list includes topical diclofenac. Some primary care/kidney doctors are OK with you continuing this but it should be discussed with your  prescribing doctor if not already discussed.   Increase activity slowly   Complete by:  As directed       Discharge Medications   Allergies as of 07/19/2018      Reactions   Niaspan [niacin Er] Nausea And Vomiting, Swelling   Swelling in mouth   Pantoprazole    Mouth sores   Bupropion Other (See Comments)   Uncontrollable shakes   Nitroglycerin    NOT allergic - cardiology has recommended she NOT use this due to h/o severe carotid disease as it may decrease cerebral perfusion   Penicillins Hives   DID THE REACTION INVOLVE: Swelling of the face/tongue/throat, SOB, or low BP? Yes Sudden or severe  rash/hives, skin peeling, or the inside of the mouth or nose? Unknown Did it require medical treatment? Unknown When did it last happen?teen  If all above answers are "NO", may proceed with cephalosporin use.   Statins    Sulfonamide Derivatives Hives   Apple Rash   Banana Rash      Medication List    STOP taking these medications   esomeprazole 40 MG capsule Commonly known as:  NEXIUM   omeprazole 20 MG capsule Commonly known as:  PRILOSEC   PT/INR Test Strp   PT/INR Testing Monitor Kit   warfarin 2.5 MG tablet Commonly known as:  COUMADIN   warfarin 5 MG tablet Commonly known as:  COUMADIN     TAKE these medications   acetaminophen 500 MG tablet Commonly known as:  TYLENOL Take 500 mg by mouth every 6 (six) hours as needed.   aspirin EC 81 MG tablet Take 1 tablet (81 mg total) by mouth daily.   budesonide-formoterol 80-4.5 MCG/ACT inhaler Commonly known as:  SYMBICORT Inhale 2 puffs into the lungs 2 (two) times daily.   calcium citrate-vitamin D 315-200 MG-UNIT tablet Commonly known as:  CITRACAL+D Take 1 tablet by mouth daily.   cetirizine 10 MG tablet Commonly known as:  ZYRTEC Take 1 tablet (10 mg total) by mouth daily as needed for allergies.   citalopram 20 MG tablet Commonly known as:  CELEXA Take 2 tablets (40 mg total) by mouth daily.     clonazePAM 1 MG tablet Commonly known as:  KLONOPIN TAKE 1 TABLET BY MOUTH THREE TIMES DAILY AS NEEDED FOR ANXIETY What changed:    reasons to take this  additional instructions   clopidogrel 75 MG tablet Commonly known as:  PLAVIX Take 1 tablet (75 mg total) by mouth daily.   D3 MAXIMUM STRENGTH 125 MCG (5000 UT) capsule Generic drug:  Cholecalciferol Take 5,000 Units by mouth daily.   Dexlansoprazole 30 MG capsule Commonly known as:  DEXILANT Take 1 capsule (30 mg total) by mouth daily. Talk to primary care doctor about continuation of medicine.   diclofenac sodium 1 % Gel Commonly known as:  VOLTAREN Apply 4 g topically 4 (four) times daily.   ezetimibe 10 MG tablet Commonly known as:  ZETIA Take 1 tablet (10 mg total) by mouth daily.   Fenofibric Acid 135 MG Cpdr TAKE 1 CAPSULE BY MOUTH ONCE DAILY What changed:  how much to take   FISH OIL-KRILL OIL PO Take 1 tablet by mouth daily.   gabapentin 100 MG capsule Commonly known as:  NEURONTIN TAKE 1 TO 2 CAPSULES BY MOUTH AT BEDTIME What changed:    how much to take  how to take this  when to take this  additional instructions   levothyroxine 125 MCG tablet Commonly known as:  SYNTHROID, LEVOTHROID Take 1 tablet (125 mcg total) by mouth daily before breakfast.   losartan 25 MG tablet Commonly known as:  COZAAR TAKE 1 TABLET BY MOUTH ONCE DAILY   montelukast 10 MG tablet Commonly known as:  SINGULAIR Take 1 tablet (10 mg total) by mouth at bedtime as needed.   VITAMIN B-12 PO Take 1 tablet by mouth daily.        Allergies:  Allergies  Allergen Reactions  . Niaspan [Niacin Er] Nausea And Vomiting and Swelling    Swelling in mouth  . Pantoprazole     Mouth sores  . Bupropion Other (See Comments)    Uncontrollable shakes  . Nitroglycerin  NOT allergic - cardiology has recommended she NOT use this due to h/o severe carotid disease as it may decrease cerebral perfusion  . Penicillins Hives     DID THE REACTION INVOLVE: Swelling of the face/tongue/throat, SOB, or low BP? Yes Sudden or severe rash/hives, skin peeling, or the inside of the mouth or nose? Unknown Did it require medical treatment? Unknown When did it last happen?teen  If all above answers are "NO", may proceed with cephalosporin use.  . Statins   . Sulfonamide Derivatives Hives  . Apple Rash  . Banana Rash     Outstanding Labs/Studies   Recommend CBC at f/u appt 12/26  Duration of Discharge Encounter   Greater than 30 minutes including physician time.  Signed, Charlie Pitter PA-C 07/19/2018, 10:51 AM  Patient seen, examined. Available data reviewed. Agree with findings, assessment, and plan as outlined by Melina Copa, PA-C.  The patient is independently interviewed and examined.  She is an alert, oriented woman in no distress.  Lungs are clear, JVP is normal, heart is regular rate and rhythm with no murmur gallop, abdomen is soft and nontender, extremities have no edema.  Her right radial site is clear.  Patient's chart is carefully reviewed.  She has been on long-term warfarin after pulmonary embolus 5 years ago.  Now with an indication for dual antiplatelet therapy with aspirin and clopidogrel.  She does not have a clear indication for long-term warfarin after remote PE and we have recommended discontinuing warfarin anticoagulation at this point.  He can remain on dual antiplatelet therapy.  She will require evaluation for carotid stenosis.  A referral to vascular surgery will be made since she follows with their practice.  Sherren Mocha, M.D. 07/19/2018 12:26 PM

## 2018-07-19 NOTE — Telephone Encounter (Signed)
Anderson Malta, Post discharge, med interaction was flagged between Celexa and newly initiated Plavix for her cardiac stent. She is on 40mg  of Celexa daily - the max dose is 20mg /day as there is a risk of QT prolongation. QT appeared normal on EKG today after initiation, but please inform patient that we will send this message to primary care Dr. Charlett Blake to consider alternatives to this medicine versus downtitrating dose.  Dayna Dunn PA-C

## 2018-07-21 NOTE — Telephone Encounter (Signed)
Let's switch her to Sertraline 50 mg daily, stop the Celexa. It has a different interaction with Plavix but we can adjust the dose lower to halp with that. Disp #30 with 2 rf

## 2018-07-22 ENCOUNTER — Other Ambulatory Visit: Payer: Self-pay | Admitting: *Deleted

## 2018-07-22 ENCOUNTER — Encounter: Payer: Self-pay | Admitting: *Deleted

## 2018-07-22 ENCOUNTER — Ambulatory Visit (HOSPITAL_COMMUNITY)
Admission: RE | Admit: 2018-07-22 | Discharge: 2018-07-22 | Disposition: A | Discharge status: Home / Self Care | Payer: Medicare HMO | Source: Ambulatory Visit | Attending: Surgery | Admitting: Surgery

## 2018-07-22 ENCOUNTER — Encounter: Payer: Self-pay | Admitting: Surgery

## 2018-07-22 ENCOUNTER — Ambulatory Visit (INDEPENDENT_AMBULATORY_CARE_PROVIDER_SITE_OTHER): Payer: Medicare HMO | Admitting: Surgery

## 2018-07-22 VITALS — BP 112/66 | HR 64 | Temp 97.4°F | Resp 14 | Ht 63.0 in | Wt 200.0 lb

## 2018-07-22 DIAGNOSIS — I6523 Occlusion and stenosis of bilateral carotid arteries: Secondary | ICD-10-CM

## 2018-07-22 DIAGNOSIS — I6521 Occlusion and stenosis of right carotid artery: Secondary | ICD-10-CM | POA: Diagnosis not present

## 2018-07-22 NOTE — Progress Notes (Signed)
Vascular and Vein Specialist of [place]  Patient name: Emily Mitchell MRN: [id number] DOB: 05-22-1946 Sex: female   REQUESTING PROVIDER:    Dr. Lennox Pippins   REASON FOR CONSULT:    Carotid stenosis  HISTORY OF PRESENT ILLNESS:   Emily Mitchell is a 72 y.o. female, who is referred here today for evaluation of her carotid disease.  She has a history of right carotid endarterectomy in 2008 by Dr. [name] for asymptomatic stenosis.  She developed a silent occlusion.  She has a known stenosis in her left carotid artery which was followed by [facility] heart and vascular.  She recently had a ultrasound which suggested 70-99% left-sided stenosis.  She states that she has had slurred garbled speech for years.  She does describe some left-sided weakness which is not acute.  She has had trouble with her vision for 4-5 months.  She is now getting shots in her right eye every 13 weeks.  The patient recently underwent coronary stenting via a right radial artery approach.  She is recovering from this and feeling much better.  She suffers from COPD secondary to a history of smoking.  She is medically managed for hypertension.  She suffers hypercholesterolemia but has a statin allergy.  She has chronic renal insufficiency with the most recent creatinine of 1.9.  PAST MEDICAL HISTORY    Past Medical History:  Diagnosis Date  . Anemia 07/05/2014  . Anxiety and depression 12/14/2008   Qualifier: Diagnosis of  By: [name] LPN, Almyra Free    . Arthritis    "fingers, toes, knees, joints" (07/18/2018)  . Asthma   . Atypical chest pain 05/23/2015  . CAD (coronary artery disease)    a. [place] with cath 06/2018 s/p DES to LAD.  Marland Kitchen Carotid artery occlusion    a. R carotid occluded, 70-62% LICA.  Marland Kitchen CHF (congestive heart failure) ([place])   . Chicken pox as a child  . Chronic kidney disease    Bright's Disease at age 49   . COPD (chronic obstructive pulmonary disease) ([place])   . GERD  (gastroesophageal reflux disease)   . H. pylori infection 10/19/2013  . Hepatitis 1970s   "don't know for sure which one it was; think it was A" (07/18/2018)  . Hyperglycemia 03/14/2016  . Hyperlipidemia   . Hypertension   . Hypothyroidism   . Knee pain, bilateral 12/17/2012  . Left hip pain 05/23/2015  . Low back pain 09/19/2015  . Macular degeneration of both eyes 12/16/2015   Right eye is wet Left Eye is dry Shots every 11 to 12 weeks in right eye at opthamologist office  . Medicare annual wellness visit, subsequent 02/21/2015  . Mumps 72 yrs old  . Pneumonia    "at least once" (07/18/2018)  . Preventative health care 03/14/2016  . Pulmonary emboli ([place]) 05/26/2013  . Sleep apnea    "don't use CPAP anymore; dr said I didn't have to" (07/18/2018)  . Stroke [facility])    "been told I've had some strokes; didn't know I'd had them" (07/18/2018)  . Tremor   . UTI (lower urinary tract infection) 10/19/2013     FAMILY HISTORY   Family History  Problem Relation Age of Onset  . Stroke Mother        mini stroke  . Kidney disease Mother   . Heart failure Mother   . Hypertension Mother   . Diabetes Sister        type 2  . Kidney disease Sister   .  Heart attack Maternal Grandfather   . Hyperlipidemia Sister     SOCIAL HISTORY:   Social History   Socioeconomic History  . Marital status: Divorced    Spouse name: Not on file  . Number of children: 2  . Years of education: Not on file  . Highest education level: Not on file  Occupational History  . Not on file  Social Needs  . Financial resource strain: Not on file  . Food insecurity:    Worry: Not on file    Inability: Not on file  . Transportation needs:    Medical: Not on file    Non-medical: Not on file  Tobacco Use  . Smoking status: Former Smoker    Packs/day: 2.00    Years: 30.00    Pack years: 60.00    Types: Cigarettes    Last attempt to quit: 12/03/1994    Years since quitting: 23.6  . Smokeless tobacco: Never  Used  Substance and Sexual Activity  . Alcohol use: Yes    Comment: 07/18/2018 "1 drink q 1-2 months; if that"  . Drug use: Never  . Sexual activity: Not Currently    Comment: lives with son and friend, no dietary restrictions  Lifestyle  . Physical activity:    Days per week: Not on file    Minutes per session: Not on file  . Stress: Not on file  Relationships  . Social connections:    Talks on phone: Not on file    Gets together: Not on file    Attends religious service: Not on file    Active member of club or organization: Not on file    Attends meetings of clubs or organizations: Not on file    Relationship status: Not on file  . Intimate partner violence:    Fear of current or ex partner: Not on file    Emotionally abused: Not on file    Physically abused: Not on file    Forced sexual activity: Not on file  Other Topics Concern  . Not on file  Social History Narrative   epworth sleepiness scale = 11 (06/15/2015)    ALLERGIES:    Allergies  Allergen Reactions  . Niaspan [Niacin Er] Nausea And Vomiting and Swelling    Swelling in mouth  . Pantoprazole     Mouth sores  . Bupropion Other (See Comments)    Uncontrollable shakes  . Nitroglycerin     NOT allergic - cardiology has recommended she NOT use this due to h/o severe carotid disease as it may decrease cerebral perfusion  . Penicillins Hives    DID THE REACTION INVOLVE: Swelling of the face/tongue/throat, SOB, or low BP? Yes Sudden or severe rash/hives, skin peeling, or the inside of the mouth or nose? Unknown Did it require medical treatment? Unknown When did it last happen?[age]  If all above answers are "NO", may proceed with cephalosporin use.  . Statins   . Sulfonamide Derivatives Hives  . Apple Rash  . Banana Rash    CURRENT MEDICATIONS:    Current Outpatient Medications  Medication Sig Dispense Refill  . acetaminophen (TYLENOL) 500 MG tablet Take 500 mg by mouth every 6 (six) hours as  needed.    Marland Kitchen aspirin EC 81 MG tablet Take 1 tablet (81 mg total) by mouth daily. 90 tablet 3  . budesonide-formoterol (SYMBICORT) 80-4.5 MCG/ACT inhaler Inhale 2 puffs into the lungs 2 (two) times daily. 1 Inhaler 5  . calcium citrate-vitamin D (CITRACAL+D)  315-200 MG-UNIT per tablet Take 1 tablet by mouth daily.     . cetirizine (ZYRTEC) 10 MG tablet Take 1 tablet (10 mg total) by mouth daily as needed for allergies. 30 tablet 11  . Cholecalciferol (D3 MAXIMUM STRENGTH) 5000 UNITS capsule Take 5,000 Units by mouth daily.    . Choline Fenofibrate (FENOFIBRIC ACID) 135 MG CPDR TAKE 1 CAPSULE BY MOUTH ONCE DAILY (Patient taking differently: Take 135 mg by mouth daily. ) 90 capsule 1  . citalopram (CELEXA) 20 MG tablet Take 2 tablets (40 mg total) by mouth daily. 180 tablet 1  . clonazePAM (KLONOPIN) 1 MG tablet TAKE 1 TABLET BY MOUTH THREE TIMES DAILY AS NEEDED FOR ANXIETY (Patient taking differently: Take 1 mg by mouth 3 (three) times daily as needed for anxiety. ) 90 tablet 2  . clopidogrel (PLAVIX) 75 MG tablet Take 1 tablet (75 mg total) by mouth daily. 30 tablet 11  . Cyanocobalamin (VITAMIN B-12 PO) Take 1 tablet by mouth daily.     Marland Kitchen Dexlansoprazole (DEXILANT) 30 MG capsule Take 1 capsule (30 mg total) by mouth daily. Talk to primary care doctor about continuation of medicine. 30 capsule 1  . diclofenac sodium (VOLTAREN) 1 % GEL Apply 4 g topically 4 (four) times daily. 5 Tube 1  . ezetimibe (ZETIA) 10 MG tablet Take 1 tablet (10 mg total) by mouth daily. 90 tablet 1  . FISH OIL-KRILL OIL PO Take 1 tablet by mouth daily.     Marland Kitchen gabapentin (NEURONTIN) 100 MG capsule TAKE 1 TO 2 CAPSULES BY MOUTH AT BEDTIME (Patient taking differently: Take 200 mg by mouth at bedtime. ) 180 capsule 1  . levothyroxine (SYNTHROID, LEVOTHROID) 125 MCG tablet Take 1 tablet (125 mcg total) by mouth daily before breakfast. 90 tablet 0  . losartan (COZAAR) 25 MG tablet TAKE 1 TABLET BY MOUTH ONCE DAILY (Patient taking  differently: Take 25 mg by mouth daily. ) 90 tablet 1  . montelukast (SINGULAIR) 10 MG tablet Take 1 tablet (10 mg total) by mouth at bedtime as needed. 90 tablet 3   No current facility-administered medications for this visit.     REVIEW OF SYSTEMS:   [X]  denotes positive finding, [ ]  denotes negative finding Cardiac  Comments:  Chest pain or chest pressure: x   Shortness of breath upon exertion: x   Short of breath when lying flat: x   Irregular heart rhythm:        Vascular    Pain in calf, thigh, or hip brought on by ambulation: x   Pain in feet at night that wakes you up from your sleep:     Blood clot in your veins:    Leg swelling:  x       Pulmonary    Oxygen at home:    Productive cough:     Wheezing:  x       Neurologic    Sudden weakness in arms or legs:  x   Sudden numbness in arms or legs:  x   Sudden onset of difficulty speaking or slurred speech: x   Temporary loss of vision in one eye:     Problems with dizziness:         Gastrointestinal    Blood in stool:      Vomited blood:         Genitourinary    Burning when urinating:     Blood in urine:        Psychiatric  Major depression:         Hematologic    Bleeding problems:    Problems with blood clotting too easily:        Skin    Rashes or ulcers:        Constitutional    Fever or chills:     PHYSICAL EXAM:   Vitals:   07/22/18 0841 07/22/18 0845  BP: 106/63 112/66  Pulse: 64   Resp: 14   Temp: (!) 97.4 F (36.3 C)   SpO2: 100%   Weight: 200 lb (90.7 kg)   Height: 5\' 3"  (1.6 m)     GENERAL: The patient is a well-nourished female, in no acute distress. The vital signs are documented above. CARDIAC: There is a regular rate and rhythm.  VASCULAR: No carotid bruits. PULMONARY: Nonlabored respirations ABDOMEN: Soft and non-tender  MUSCULOSKELETAL: There are no major deformities or cyanosis. NEUROLOGIC: No focal weakness or paresthesias are detected. SKIN: There are no ulcers or  rashes noted. PSYCHIATRIC: The patient has a normal affect.  STUDIES:   I have reviewed her ultrasound from [facility] which shows 70-9 9% left-sided stenosis.  I have ordered and reviewed ultrasound here which confirmed right carotid occlusion however 1-39% stenosis was encountered on the left side.  ASSESSMENT and PLAN   Carotid stenosis: The patient has a known occlusion of her right carotid artery.  She does have multiple neurologic symptoms, however none are acute, and none are specifically pinpointed to stenosis within the left carotid artery.  I have reviewed her images from today's study and suspect that her stenosis is less than 80%, however with 2 ultrasounds that contradict each other I feel that a confirmatory test is necessary.  Unfortunately she suffers from renal insufficiency and so I do not think a CT scan should be performed but rather catheterization.  This would be from a right femoral approach.  I will use as little dye as possible.  Because she is recovering from cardiac catheterization, I will wait until after the holiday before proceeding with angiographic evaluation of her left carotid artery.  This is been scheduled for January 14   Annamarie Major, MD [facility] and Vein Specialists of [facility] [phone] Pager [phone]

## 2018-07-22 NOTE — H&P (View-Only) (Signed)
Vascular and Vein Specialist of [place]  Patient name: Emily Mitchell MRN: [id number] DOB: 1945/11/05 Sex: female   REQUESTING PROVIDER:    Dr. Lennox Pippins   REASON FOR CONSULT:    Carotid stenosis  HISTORY OF PRESENT ILLNESS:   Emily Mitchell is a 72 y.o. female, who is referred here today for evaluation of her carotid disease.  She has a history of right carotid endarterectomy in 2008 by Dr. [name] for asymptomatic stenosis.  She developed a silent occlusion.  She has a known stenosis in her left carotid artery which was followed by [facility] heart and vascular.  She recently had a ultrasound which suggested 70-99% left-sided stenosis.  She states that she has had slurred garbled speech for years.  She does describe some left-sided weakness which is not acute.  She has had trouble with her vision for 4-5 months.  She is now getting shots in her right eye every 13 weeks.  The patient recently underwent coronary stenting via a right radial artery approach.  She is recovering from this and feeling much better.  She suffers from COPD secondary to a history of smoking.  She is medically managed for hypertension.  She suffers hypercholesterolemia but has a statin allergy.  She has chronic renal insufficiency with the most recent creatinine of 1.9.  PAST MEDICAL HISTORY    Past Medical History:  Diagnosis Date  . Anemia 07/05/2014  . Anxiety and depression 12/14/2008   Qualifier: Diagnosis of  By: [name] LPN, Almyra Free    . Arthritis    "fingers, toes, knees, joints" (07/18/2018)  . Asthma   . Atypical chest pain 05/23/2015  . CAD (coronary artery disease)    a. [place] with cath 06/2018 s/p DES to LAD.  Marland Kitchen Carotid artery occlusion    a. R carotid occluded, 51-76% LICA.  Marland Kitchen CHF (congestive heart failure) ([place])   . Chicken pox as a child  . Chronic kidney disease    Bright's Disease at age 19   . COPD (chronic obstructive pulmonary disease) ([place])   . GERD  (gastroesophageal reflux disease)   . H. pylori infection 10/19/2013  . Hepatitis 1970s   "don't know for sure which one it was; think it was A" (07/18/2018)  . Hyperglycemia 03/14/2016  . Hyperlipidemia   . Hypertension   . Hypothyroidism   . Knee pain, bilateral 12/17/2012  . Left hip pain 05/23/2015  . Low back pain 09/19/2015  . Macular degeneration of both eyes 12/16/2015   Right eye is wet Left Eye is dry Shots every 11 to 12 weeks in right eye at opthamologist office  . Medicare annual wellness visit, subsequent 02/21/2015  . Mumps 72 yrs old  . Pneumonia    "at least once" (07/18/2018)  . Preventative health care 03/14/2016  . Pulmonary emboli ([place]) 05/26/2013  . Sleep apnea    "don't use CPAP anymore; dr said I didn't have to" (07/18/2018)  . Stroke [facility])    "been told I've had some strokes; didn't know I'd had them" (07/18/2018)  . Tremor   . UTI (lower urinary tract infection) 10/19/2013     FAMILY HISTORY   Family History  Problem Relation Age of Onset  . Stroke Mother        mini stroke  . Kidney disease Mother   . Heart failure Mother   . Hypertension Mother   . Diabetes Sister        type 2  . Kidney disease Sister   .  Heart attack Maternal Grandfather   . Hyperlipidemia Sister     SOCIAL HISTORY:   Social History   Socioeconomic History  . Marital status: Divorced    Spouse name: Not on file  . Number of children: 2  . Years of education: Not on file  . Highest education level: Not on file  Occupational History  . Not on file  Social Needs  . Financial resource strain: Not on file  . Food insecurity:    Worry: Not on file    Inability: Not on file  . Transportation needs:    Medical: Not on file    Non-medical: Not on file  Tobacco Use  . Smoking status: Former Smoker    Packs/day: 2.00    Years: 30.00    Pack years: 60.00    Types: Cigarettes    Last attempt to quit: 12/03/1994    Years since quitting: 23.6  . Smokeless tobacco: Never  Used  Substance and Sexual Activity  . Alcohol use: Yes    Comment: 07/18/2018 "1 drink q 1-2 months; if that"  . Drug use: Never  . Sexual activity: Not Currently    Comment: lives with son and friend, no dietary restrictions  Lifestyle  . Physical activity:    Days per week: Not on file    Minutes per session: Not on file  . Stress: Not on file  Relationships  . Social connections:    Talks on phone: Not on file    Gets together: Not on file    Attends religious service: Not on file    Active member of club or organization: Not on file    Attends meetings of clubs or organizations: Not on file    Relationship status: Not on file  . Intimate partner violence:    Fear of current or ex partner: Not on file    Emotionally abused: Not on file    Physically abused: Not on file    Forced sexual activity: Not on file  Other Topics Concern  . Not on file  Social History Narrative   epworth sleepiness scale = 11 (06/15/2015)    ALLERGIES:    Allergies  Allergen Reactions  . Niaspan [Niacin Er] Nausea And Vomiting and Swelling    Swelling in mouth  . Pantoprazole     Mouth sores  . Bupropion Other (See Comments)    Uncontrollable shakes  . Nitroglycerin     NOT allergic - cardiology has recommended she NOT use this due to h/o severe carotid disease as it may decrease cerebral perfusion  . Penicillins Hives    DID THE REACTION INVOLVE: Swelling of the face/tongue/throat, SOB, or low BP? Yes Sudden or severe rash/hives, skin peeling, or the inside of the mouth or nose? Unknown Did it require medical treatment? Unknown When did it last happen?[age]  If all above answers are "NO", may proceed with cephalosporin use.  . Statins   . Sulfonamide Derivatives Hives  . Apple Rash  . Banana Rash    CURRENT MEDICATIONS:    Current Outpatient Medications  Medication Sig Dispense Refill  . acetaminophen (TYLENOL) 500 MG tablet Take 500 mg by mouth every 6 (six) hours as  needed.    Marland Kitchen aspirin EC 81 MG tablet Take 1 tablet (81 mg total) by mouth daily. 90 tablet 3  . budesonide-formoterol (SYMBICORT) 80-4.5 MCG/ACT inhaler Inhale 2 puffs into the lungs 2 (two) times daily. 1 Inhaler 5  . calcium citrate-vitamin D (CITRACAL+D)  315-200 MG-UNIT per tablet Take 1 tablet by mouth daily.     . cetirizine (ZYRTEC) 10 MG tablet Take 1 tablet (10 mg total) by mouth daily as needed for allergies. 30 tablet 11  . Cholecalciferol (D3 MAXIMUM STRENGTH) 5000 UNITS capsule Take 5,000 Units by mouth daily.    . Choline Fenofibrate (FENOFIBRIC ACID) 135 MG CPDR TAKE 1 CAPSULE BY MOUTH ONCE DAILY (Patient taking differently: Take 135 mg by mouth daily. ) 90 capsule 1  . citalopram (CELEXA) 20 MG tablet Take 2 tablets (40 mg total) by mouth daily. 180 tablet 1  . clonazePAM (KLONOPIN) 1 MG tablet TAKE 1 TABLET BY MOUTH THREE TIMES DAILY AS NEEDED FOR ANXIETY (Patient taking differently: Take 1 mg by mouth 3 (three) times daily as needed for anxiety. ) 90 tablet 2  . clopidogrel (PLAVIX) 75 MG tablet Take 1 tablet (75 mg total) by mouth daily. 30 tablet 11  . Cyanocobalamin (VITAMIN B-12 PO) Take 1 tablet by mouth daily.     Marland Kitchen Dexlansoprazole (DEXILANT) 30 MG capsule Take 1 capsule (30 mg total) by mouth daily. Talk to primary care doctor about continuation of medicine. 30 capsule 1  . diclofenac sodium (VOLTAREN) 1 % GEL Apply 4 g topically 4 (four) times daily. 5 Tube 1  . ezetimibe (ZETIA) 10 MG tablet Take 1 tablet (10 mg total) by mouth daily. 90 tablet 1  . FISH OIL-KRILL OIL PO Take 1 tablet by mouth daily.     Marland Kitchen gabapentin (NEURONTIN) 100 MG capsule TAKE 1 TO 2 CAPSULES BY MOUTH AT BEDTIME (Patient taking differently: Take 200 mg by mouth at bedtime. ) 180 capsule 1  . levothyroxine (SYNTHROID, LEVOTHROID) 125 MCG tablet Take 1 tablet (125 mcg total) by mouth daily before breakfast. 90 tablet 0  . losartan (COZAAR) 25 MG tablet TAKE 1 TABLET BY MOUTH ONCE DAILY (Patient taking  differently: Take 25 mg by mouth daily. ) 90 tablet 1  . montelukast (SINGULAIR) 10 MG tablet Take 1 tablet (10 mg total) by mouth at bedtime as needed. 90 tablet 3   No current facility-administered medications for this visit.     REVIEW OF SYSTEMS:   [X]  denotes positive finding, [ ]  denotes negative finding Cardiac  Comments:  Chest pain or chest pressure: x   Shortness of breath upon exertion: x   Short of breath when lying flat: x   Irregular heart rhythm:        Vascular    Pain in calf, thigh, or hip brought on by ambulation: x   Pain in feet at night that wakes you up from your sleep:     Blood clot in your veins:    Leg swelling:  x       Pulmonary    Oxygen at home:    Productive cough:     Wheezing:  x       Neurologic    Sudden weakness in arms or legs:  x   Sudden numbness in arms or legs:  x   Sudden onset of difficulty speaking or slurred speech: x   Temporary loss of vision in one eye:     Problems with dizziness:         Gastrointestinal    Blood in stool:      Vomited blood:         Genitourinary    Burning when urinating:     Blood in urine:        Psychiatric  Major depression:         Hematologic    Bleeding problems:    Problems with blood clotting too easily:        Skin    Rashes or ulcers:        Constitutional    Fever or chills:     PHYSICAL EXAM:   Vitals:   07/22/18 0841 07/22/18 0845  BP: 106/63 112/66  Pulse: 64   Resp: 14   Temp: (!) 97.4 F (36.3 C)   SpO2: 100%   Weight: 200 lb (90.7 kg)   Height: 5\' 3"  (1.6 m)     GENERAL: The patient is a well-nourished female, in no acute distress. The vital signs are documented above. CARDIAC: There is a regular rate and rhythm.  VASCULAR: No carotid bruits. PULMONARY: Nonlabored respirations ABDOMEN: Soft and non-tender  MUSCULOSKELETAL: There are no major deformities or cyanosis. NEUROLOGIC: No focal weakness or paresthesias are detected. SKIN: There are no ulcers or  rashes noted. PSYCHIATRIC: The patient has a normal affect.  STUDIES:   I have reviewed her ultrasound from [facility] imaging which shows 70-9 9% left-sided stenosis.  I have ordered and reviewed ultrasound here which confirmed right carotid occlusion however 1-39% stenosis was encountered on the left side.  ASSESSMENT and PLAN   Carotid stenosis: The patient has a known occlusion of her right carotid artery.  She does have multiple neurologic symptoms, however none are acute, and none are specifically pinpointed to stenosis within the left carotid artery.  I have reviewed her images from today's study and suspect that her stenosis is less than 80%, however with 2 ultrasounds that contradict each other I feel that a confirmatory test is necessary.  Unfortunately she suffers from renal insufficiency and so I do not think a CT scan should be performed but rather catheterization.  This would be from a right femoral approach.  I will use as little dye as possible.  Because she is recovering from cardiac catheterization, I will wait until after the holiday before proceeding with angiographic evaluation of her left carotid artery.  This is been scheduled for January 14   Annamarie Major, MD [facility] of [facility] [phone] Pager [phone]

## 2018-07-23 ENCOUNTER — Encounter: Payer: Self-pay | Admitting: Gastroenterology

## 2018-07-23 MED ORDER — SERTRALINE HCL 50 MG PO TABS: 50.0000 mg | ORAL_TABLET | Freq: Every day | ORAL | 3 refills | Status: DC

## 2018-07-23 NOTE — Addendum Note (Signed)
Addended by: Magdalene Molly A on: 07/23/2018 09:47 AM   Modules accepted: Orders

## 2018-07-23 NOTE — Telephone Encounter (Signed)
Medication change made. 

## 2018-07-25 ENCOUNTER — Encounter: Payer: Self-pay | Admitting: Cardiology

## 2018-07-25 ENCOUNTER — Ambulatory Visit (INDEPENDENT_AMBULATORY_CARE_PROVIDER_SITE_OTHER): Payer: Medicare HMO | Admitting: Cardiology

## 2018-07-25 VITALS — BP 122/70 | HR 64 | Ht 63.0 in | Wt 194.0 lb

## 2018-07-25 DIAGNOSIS — E782 Mixed hyperlipidemia: Secondary | ICD-10-CM | POA: Diagnosis not present

## 2018-07-25 DIAGNOSIS — G4733 Obstructive sleep apnea (adult) (pediatric): Secondary | ICD-10-CM | POA: Diagnosis not present

## 2018-07-25 DIAGNOSIS — I2510 Atherosclerotic heart disease of native coronary artery without angina pectoris: Secondary | ICD-10-CM | POA: Diagnosis not present

## 2018-07-25 MED ORDER — NITROGLYCERIN 0.4 MG SL SUBL: 0.4000 mg | SUBLINGUAL_TABLET | SUBLINGUAL | 3 refills | Status: DC | PRN

## 2018-07-25 NOTE — Progress Notes (Signed)
Cardiology Office Note:    Date:  07/25/2018   ID:  Emily Mitchell, DOB Mar 19, 1946, MRN [id number]  PCP:  Mosie Lukes, MD  Cardiologist:  Jenean Lindau, MD   Referring MD: Mosie Lukes, MD    ASSESSMENT:    1. Coronary artery disease involving native coronary artery of native heart without angina pectoris   2. Hyperlipidemia, mixed   3. OSA (obstructive sleep apnea)    PLAN:    In order of problems listed above:  1. Secondary prevention stressed with the patient.  Importance of compliance with diet and medication stressed.  Her blood pressure is stable.  Diet was discussed as of regular exercise stressed. 2. I reassured her about her wrist and the ecchymosis underneath the area of the cath access. 3. Her lipids are unremarkable and I reviewed the available labs done in December this year. 4. She is planning to undergo carotid intervention stenting and this is followed by her vascular specialist. 5. Patient will be seen in follow-up appointment in 6 months or earlier if the patient has any concerns 6. Sublingual nitroglycerin prescription was sent, its protocol and 911 protocol explained and the patient vocalized understanding questions were answered to the patient's satisfaction.   Medication Adjustments/Labs and Tests Ordered: Current medicines are reviewed at length with the patient today.  Concerns regarding medicines are outlined above.  No orders of the defined types were placed in this encounter.  No orders of the defined types were placed in this encounter.    No chief complaint on file.    History of Present Illness:    TOTIANA EVERSON is a 72 y.o. female.  Patient was evaluated by me for preop assessment and had significant symptoms suggesting of angina.  She underwent coronary angiography and coronary intervention of the left anterior descending.  She denies any problems at this time and takes care of activities of daily living.  No chest pain orthopnea  or PND.  Her right wrist has a bruise but the pulsations seem to be preserved and also she has no issues with pain or strength of the hand.  At the time of my evaluation, the patient is alert awake oriented and in no distress.  Past Medical History:  Diagnosis Date  . Anemia 07/05/2014  . Anxiety and depression 12/14/2008   Qualifier: Diagnosis of  By: [name] LPN, Almyra Free    . Arthritis    "fingers, toes, knees, joints" (07/18/2018)  . Asthma   . Atypical chest pain 05/23/2015  . CAD (coronary artery disease)    a. [place] with cath 06/2018 s/p DES to LAD.  Marland Kitchen Carotid artery occlusion    a. R carotid occluded, 40-98% LICA.  Marland Kitchen CHF (congestive heart failure) ([place])   . Chicken pox as a child  . Chronic kidney disease    Bright's Disease at age 89   . COPD (chronic obstructive pulmonary disease) ([place])   . GERD (gastroesophageal reflux disease)   . H. pylori infection 10/19/2013  . Hepatitis 1970s   "don't know for sure which one it was; think it was A" (07/18/2018)  . Hyperglycemia 03/14/2016  . Hyperlipidemia   . Hypertension   . Hypothyroidism   . Knee pain, bilateral 12/17/2012  . Left hip pain 05/23/2015  . Low back pain 09/19/2015  . Macular degeneration of both eyes 12/16/2015   Right eye is wet Left Eye is dry Shots every 11 to 12 weeks in right eye at opthamologist office  .  Medicare annual wellness visit, subsequent 02/21/2015  . Mumps 72 yrs old  . Pneumonia    "at least once" (07/18/2018)  . Preventative health care 03/14/2016  . Pulmonary emboli ([place]) 05/26/2013  . Sleep apnea    "don't use CPAP anymore; dr said I didn't have to" (07/18/2018)  . Stroke [facility])    "been told I've had some strokes; didn't know I'd had them" (07/18/2018)  . Tremor   . UTI (lower urinary tract infection) 10/19/2013    Past Surgical History:  Procedure Laterality Date  . APPENDECTOMY  1984  . CARDIAC CATHETERIZATION  ~ 2006  . CAROTID ENDARTERECTOMY Right 05/10/07   cea  . CATARACT EXTRACTION  Left   . CATARACT EXTRACTION W/ INTRAOCULAR LENS  IMPLANT, BILATERAL Bilateral   . CORONARY ANGIOPLASTY WITH STENT PLACEMENT  07/18/2018  . CORONARY STENT INTERVENTION N/A 07/18/2018   Procedure: CORONARY STENT INTERVENTION;  Surgeon: Lorretta Harp, MD;  Location: [place] [facility];  Service: Cardiovascular;  Laterality: N/A;  . INTRAVASCULAR PRESSURE WIRE/FFR STUDY N/A 07/18/2018   Procedure: INTRAVASCULAR PRESSURE WIRE/FFR STUDY;  Surgeon: Lorretta Harp, MD;  Location: [place] [facility];  Service: Cardiovascular;  Laterality: N/A;  . JOINT REPLACEMENT    . LEFT HEART CATH AND CORONARY ANGIOGRAPHY N/A 07/18/2018   Procedure: LEFT HEART CATH AND CORONARY ANGIOGRAPHY;  Surgeon: Lorretta Harp, MD;  Location: [place] [facility];  Service: Cardiovascular;  Laterality: N/A;  . MULTIPLE TOOTH EXTRACTIONS    . TOTAL KNEE ARTHROPLASTY Bilateral 2005 - 2011   right - left  . VAGINAL HYSTERECTOMY  1984  . WISDOM TOOTH EXTRACTION      Current Medications: Current Meds  Medication Sig  . acetaminophen (TYLENOL) 500 MG tablet Take 500 mg by mouth every 6 (six) hours as needed.  Marland Kitchen aspirin EC 81 MG tablet Take 1 tablet (81 mg total) by mouth daily.  . budesonide-formoterol (SYMBICORT) 80-4.5 MCG/ACT inhaler Inhale 2 puffs into the lungs 2 (two) times daily.  . calcium citrate-vitamin D (CITRACAL+D) 315-200 MG-UNIT per tablet Take 1 tablet by mouth daily.   . cetirizine (ZYRTEC) 10 MG tablet Take 1 tablet (10 mg total) by mouth daily as needed for allergies.  . Cholecalciferol (D3 MAXIMUM STRENGTH) 5000 UNITS capsule Take 5,000 Units by mouth daily.  . Choline Fenofibrate (FENOFIBRIC ACID) 135 MG CPDR TAKE 1 CAPSULE BY MOUTH ONCE DAILY (Patient taking differently: Take 135 mg by mouth daily. )  . citalopram (CELEXA) 10 MG tablet Take 10 mg by mouth daily.  . clonazePAM (KLONOPIN) 1 MG tablet TAKE 1 TABLET BY MOUTH THREE TIMES DAILY AS NEEDED FOR ANXIETY (Patient taking differently: Take 1  mg by mouth 3 (three) times daily as needed for anxiety. )  . clopidogrel (PLAVIX) 75 MG tablet Take 1 tablet (75 mg total) by mouth daily.  . Cyanocobalamin (VITAMIN B-12 PO) Take 1 tablet by mouth daily.   Marland Kitchen Dexlansoprazole (DEXILANT) 30 MG capsule Take 1 capsule (30 mg total) by mouth daily. Talk to primary care doctor about continuation of medicine.  . ezetimibe (ZETIA) 10 MG tablet Take 1 tablet (10 mg total) by mouth daily.  Marland Kitchen FISH OIL-KRILL OIL PO Take 1 tablet by mouth daily.   Marland Kitchen gabapentin (NEURONTIN) 100 MG capsule TAKE 1 TO 2 CAPSULES BY MOUTH AT BEDTIME (Patient taking differently: Take 200 mg by mouth at bedtime. )  . levothyroxine (SYNTHROID, LEVOTHROID) 125 MCG tablet Take 1 tablet (125 mcg total) by mouth daily before  breakfast.  . losartan (COZAAR) 25 MG tablet TAKE 1 TABLET BY MOUTH ONCE DAILY (Patient taking differently: Take 25 mg by mouth daily. )  . montelukast (SINGULAIR) 10 MG tablet Take 1 tablet (10 mg total) by mouth at bedtime as needed.  . sertraline (ZOLOFT) 50 MG tablet Take 1 tablet (50 mg total) by mouth daily.     Allergies:   Niaspan [niacin er]; Pantoprazole; Bupropion; Nitroglycerin; Penicillins; Statins; Sulfonamide derivatives; Apple; and Banana   Social History   Socioeconomic History  . Marital status: Divorced    Spouse name: Not on file  . Number of children: 2  . Years of education: Not on file  . Highest education level: Not on file  Occupational History  . Not on file  Social Needs  . Financial resource strain: Not on file  . Food insecurity:    Worry: Not on file    Inability: Not on file  . Transportation needs:    Medical: Not on file    Non-medical: Not on file  Tobacco Use  . Smoking status: Former Smoker    Packs/day: 2.00    Years: 30.00    Pack years: 60.00    Types: Cigarettes    Last attempt to quit: 12/03/1994    Years since quitting: 23.6  . Smokeless tobacco: Never Used  Substance and Sexual Activity  . Alcohol use:  Yes    Comment: 07/18/2018 "1 drink q 1-2 months; if that"  . Drug use: Never  . Sexual activity: Not Currently    Comment: lives with son and friend, no dietary restrictions  Lifestyle  . Physical activity:    Days per week: Not on file    Minutes per session: Not on file  . Stress: Not on file  Relationships  . Social connections:    Talks on phone: Not on file    Gets together: Not on file    Attends religious service: Not on file    Active member of club or organization: Not on file    Attends meetings of clubs or organizations: Not on file    Relationship status: Not on file  Other Topics Concern  . Not on file  Social History Narrative   epworth sleepiness scale = 11 (06/15/2015)     Family History: The patient's family history includes Diabetes in her sister; Heart attack in her maternal grandfather; Heart failure in her mother; Hyperlipidemia in her sister; Hypertension in her mother; Kidney disease in her mother and sister; Stroke in her mother.  ROS:   Please see the history of present illness.    All other systems reviewed and are negative.  EKGs/Labs/Other Studies Reviewed:    The following studies were reviewed today: Coronary angiography report was discussed with the patient at extensive length.  ALOHA BARTOK  CARDIAC CATHETERIZATION  Order# [id number]  Reading physician: Lorretta Harp, MD Ordering physician: Lorretta Harp, MD Study date: 07/18/18  Physicians   Panel Physicians Referring Physician Case Authorizing Physician  Lorretta Harp, MD (Primary)    Procedures   CORONARY STENT INTERVENTION  INTRAVASCULAR PRESSURE WIRE/FFR STUDY  LEFT HEART CATH AND CORONARY ANGIOGRAPHY  Conclusion     Prox LAD to Mid LAD lesion is 60% stenosed.  A drug-eluting stent was successfully placed.  Post intervention, there is a 0% residual stenosis.   LORANN TANI is a 72 y.o. female    [id number] LOCATION:  FACILITY: [facility]  PHYSICIAN:  Quay Burow, M.D. 1946-07-30  Recent Labs: 02/28/2018: ALT 11 07/08/2018: TSH 0.84 07/19/2018: BUN 29; Creatinine, Ser 1.96; Hemoglobin 9.8; Platelets 189; Potassium 4.2; Sodium 140  Recent Lipid Panel    Component Value Date/Time   CHOL 172 07/08/2018 1511   TRIG 118.0 07/08/2018 1511   HDL 60.30 07/08/2018 1511   CHOLHDL 3 07/08/2018 1511   VLDL 23.6 07/08/2018 1511   LDLCALC 88 07/08/2018 1511   LDLDIRECT 129.0 02/28/2018 1458    Physical Exam:    VS:  BP 122/70 (BP Location: Right Arm, Patient Position: Sitting, Cuff Size: Normal)   Pulse 64   Ht 5\' 3"  (1.6 m)   Wt 194 lb (88 kg)   SpO2 98%   BMI 34.37 kg/m     Wt Readings from Last 3 Encounters:  07/25/18 194 lb (88 kg)  07/22/18 200 lb (90.7 kg)  07/19/18 191 lb 12.8 oz (87 kg)     GEN: Patient is in no acute distress HEENT: Normal NECK: No JVD; No carotid bruits LYMPHATICS: No lymphadenopathy CARDIAC: Hear sounds regular, 2/6 systolic murmur at the apex. RESPIRATORY:  Clear to auscultation without rales, wheezing or rhonchi  ABDOMEN: Soft, non-tender, non-distended MUSCULOSKELETAL:  No edema; No deformity  SKIN: Warm and dry NEUROLOGIC:  Alert and oriented x 3 PSYCHIATRIC:  Normal affect   Signed, Jenean Lindau, MD  07/25/2018 9:32 AM    [facility]

## 2018-07-25 NOTE — Addendum Note (Signed)
Addended by: Tarri Glenn on: 07/25/2018 09:50 AM   Modules accepted: Orders

## 2018-07-25 NOTE — Patient Instructions (Signed)
Medication Instructions:  Your physician recommends that you continue on your current medications as directed. Please refer to the Current Medication list given to you today.  If you need a refill on your cardiac medications before your next appointment, please call your pharmacy.   Lab work: None  If you have labs (blood work) drawn today and your tests are completely normal, you will receive your results only by: Marland Kitchen MyChart Message (if you have MyChart) OR . A paper copy in the mail If you have any lab test that is abnormal or we need to change your treatment, we will call you to review the results.  Testing/Procedures: None  Follow-Up: At [facility], you and your health needs are our priority.  As part of our continuing mission to provide you with exceptional heart care, we have created designated Provider Care Teams.  These Care Teams include your primary Cardiologist (physician) and Advanced Practice Providers (APPs -  Physician Assistants and Nurse Practitioners) who all work together to provide you with the care you need, when you need it. You will need a follow up appointment in 6 months.  Please call our office 2 months in advance to schedule this appointment.  You may see Jenean Lindau, MD or another member of our [facility] Provider Team in : Jenne Campus, MD . Shirlee More, MD  Any Other Special Instructions Will Be Listed Below (If Applicable).

## 2018-07-29 ENCOUNTER — Telehealth: Payer: Self-pay

## 2018-07-29 DIAGNOSIS — N6320 Unspecified lump in the left breast, unspecified quadrant: Secondary | ICD-10-CM

## 2018-07-29 NOTE — Telephone Encounter (Signed)
Copied from [place] [id number]. Topic: General - Other >> Jul 29, 2018  1:05 PM Yvette Rack wrote: Reason for CRM: Sharyn Lull from [facility] [phone] calling stating that pt was told to get a mammogram because something showed abnormal Sharyn Lull would  like an order for mammogram

## 2018-07-29 NOTE — Telephone Encounter (Signed)
Spoke with Sharyn Lull and let her know I will be sending over the orders. Faxed to [phone]

## 2018-07-30 NOTE — Telephone Encounter (Signed)
Orders have been placed and faxed

## 2018-08-06 DIAGNOSIS — R9389 Abnormal findings on diagnostic imaging of other specified body structures: Secondary | ICD-10-CM | POA: Diagnosis not present

## 2018-08-06 DIAGNOSIS — R922 Inconclusive mammogram: Secondary | ICD-10-CM | POA: Diagnosis not present

## 2018-08-06 DIAGNOSIS — R921 Mammographic calcification found on diagnostic imaging of breast: Secondary | ICD-10-CM | POA: Diagnosis not present

## 2018-08-06 LAB — HM MAMMOGRAPHY

## 2018-08-07 ENCOUNTER — Telehealth: Payer: Self-pay | Admitting: *Deleted

## 2018-08-07 NOTE — Telephone Encounter (Signed)
Received Mammogram with CAD and TOMO results from [facility]; forwarded to provider/SLS 01/08

## 2018-08-09 ENCOUNTER — Encounter: Payer: Self-pay | Admitting: Family Medicine

## 2018-08-09 ENCOUNTER — Other Ambulatory Visit: Payer: Self-pay | Admitting: Family Medicine

## 2018-08-09 ENCOUNTER — Telehealth: Payer: Self-pay

## 2018-08-09 ENCOUNTER — Ambulatory Visit: Payer: Medicare HMO | Admitting: Neurology

## 2018-08-09 MED ORDER — CLOPIDOGREL BISULFATE 75 MG PO TABS: 75.0000 mg | ORAL_TABLET | Freq: Every day | ORAL | 6 refills | Status: DC

## 2018-08-09 NOTE — Telephone Encounter (Signed)
She only needs Dexilant if she is not having heartburn or abdominal pain.

## 2018-08-09 NOTE — Telephone Encounter (Signed)
Copied from [place] [id number]. Topic: General - Other >> Aug 09, 2018 12:47 PM Windy Kalata wrote: Reason for CRM: Patient has been taking  Dexlansoprazole ([place]) 30 MG capsule since having a heart stent back on 07/18/18. She is inquiring if Dr Charlett Blake would like her to continue taking this medication, please advise  Best call back is [phone]    Please advise

## 2018-08-09 NOTE — Telephone Encounter (Signed)
Copied from [place] [id number]. Topic: Quick Communication - Rx Refill/Question >> Aug 09, 2018 12:59 PM Burchel, Abbi R wrote: Medication: clopidogrel (PLAVIX) 75 MG tablet  Pt requesting new rx  Preferred Pharmacy: clopidogrel (PLAVIX) 75 MG tablet  Agent: Please be advised that RX refills may take up to 3 business days. We ask that you follow-up with your pharmacy.

## 2018-08-09 NOTE — Telephone Encounter (Signed)
Patient made aware.

## 2018-08-14 ENCOUNTER — Encounter: Payer: Self-pay | Admitting: Allergy and Immunology

## 2018-08-14 ENCOUNTER — Ambulatory Visit: Payer: Self-pay | Admitting: Allergy and Immunology

## 2018-08-14 ENCOUNTER — Ambulatory Visit (INDEPENDENT_AMBULATORY_CARE_PROVIDER_SITE_OTHER): Payer: Medicare HMO | Admitting: Allergy and Immunology

## 2018-08-14 VITALS — BP 124/60 | HR 66 | Temp 97.7°F | Resp 18 | Ht 63.0 in | Wt 193.0 lb

## 2018-08-14 DIAGNOSIS — J449 Chronic obstructive pulmonary disease, unspecified: Secondary | ICD-10-CM | POA: Diagnosis not present

## 2018-08-14 DIAGNOSIS — Z91018 Allergy to other foods: Secondary | ICD-10-CM

## 2018-08-14 DIAGNOSIS — J310 Chronic rhinitis: Secondary | ICD-10-CM

## 2018-08-14 HISTORY — DX: Allergy to other foods: Z91.018

## 2018-08-14 NOTE — Assessment & Plan Note (Signed)
   We were unable to perform skin tests today due to recent administration of antihistamine.   The patient is scheduled to return in the near future for allergy skin testing after having been off of antihistamines for at least 3 days.  Further recommendations will be made at that time based upon skin test results.

## 2018-08-14 NOTE — Patient Instructions (Addendum)
History of food allergy  We were unable to perform skin tests today due to recent administration of antihistamine.   The patient is scheduled to return in the near future for allergy skin testing after having been off of antihistamines for at least 3 days.  Further recommendations will be made at that time based upon skin test results.  Chronic rhinitis  The patient is scheduled to return in the near future for allergy skin testing after having been off of antihistamines for at least 3 days.    For now, continue current regimen.  COPD with asthma ([place])  Continue Symbicort and montelukast as prescribed by pulmonologist.  Follow-up with Dr. Elsworth Soho as directed.   Return for allergy skin testing after having been off of antihistamines for at least 3 days.

## 2018-08-14 NOTE — Assessment & Plan Note (Signed)
   Continue Symbicort and montelukast as prescribed by pulmonologist.  Follow-up with Dr. Elsworth Soho as directed.

## 2018-08-14 NOTE — Progress Notes (Signed)
New Patient Note  RE: Emily Mitchell MRN: [id number] DOB: 08-02-1945 Date of Office Visit: 08/14/2018  Referring provider: Mosie Lukes, MD Primary care provider: Mosie Lukes, MD  Chief Complaint: Allergy Testing; Possible Food Allergy; and Allergic Rhinitis    History of present illness: Emily Mitchell is a 73 y.o. female seen today in consultation requested by Penni Homans, MD.  She reports that approximately 6 years ago she had skin testing at [facility] and was told that she was allergic to apples and bananas.  Prior to that testing she had consumed apples and bananas on a regular basis without symptoms.  Over the past 6 years she has avoided apples and bananas, however is interested in reintroducing these foods into her diet and therefore would like to have her food allergy status reevaluated.  She experiences nasal congestion, rhinorrhea, sneezing, nasal pruritus, and ocular pruritus.  These symptoms occur year-round without significant seasonal variation and seem to be exacerbated by exposure to cats.  She has cats in her home which do not have access to her bedroom.  She attempts to control the nasal/ocular symptoms by taking cetirizine daily. She has a diagnosis of COPD which is followed by Dr. Elsworth Soho.  She reports that her lower respiratory symptoms are well controlled on her current regimen of Symbicort 80-4.5 g, 2 inhalations twice daily, and montelukast 10 mg daily.  Assessment and plan: History of food allergy  We were unable to perform skin tests today due to recent administration of antihistamine.   The patient is scheduled to return in the near future for allergy skin testing after having been off of antihistamines for at least 3 days.  Further recommendations will be made at that time based upon skin test results.  Chronic rhinitis  The patient is scheduled to return in the near future for allergy skin testing after having been off of antihistamines  for at least 3 days.    For now, continue current regimen.  COPD with asthma ([place])  Continue Symbicort and montelukast as prescribed by pulmonologist.  Follow-up with Dr. Elsworth Soho as directed.   Diagnostics: Spirometry: Spirometry reveals an FVC of 1.60 L (56% predicted) and an FEV1 of 1.10 L (51% predicted) with an FEV1 ratio of 91%.  Moderate restriction.  Please see scanned spirometry results for details. Allergy skin testing: We were unable to perform skin tests today due to recent administration of antihistamine.     Physical examination: Blood pressure 124/60, pulse 66, temperature 97.7 F (36.5 C), temperature source Oral, resp. rate 18, height 5\' 3"  (1.6 m), weight 193 lb (87.5 kg), SpO2 96 %.  General: Alert, interactive, in no acute distress. HEENT: TMs pearly gray, turbinates mildly edematous without discharge, post-pharynx unremarkable. Neck: Supple without lymphadenopathy. Lungs: Decreased breath sounds bilaterally without wheezing, rhonchi or rales. CV: Normal S1, S2 without murmurs. Abdomen: Nondistended, nontender. Skin: Warm and dry, without lesions or rashes. Extremities:  No clubbing, cyanosis or edema. Neuro:   Grossly intact.  Review of systems:  Review of systems negative except as noted in HPI / PMHx or noted below: Review of Systems  Constitutional: Negative.   HENT: Negative.   Eyes: Negative.   Respiratory: Negative.   Cardiovascular: Negative.   Gastrointestinal: Negative.   Genitourinary: Negative.   Musculoskeletal: Negative.   Skin: Negative.   Neurological: Negative.   Endo/Heme/Allergies: Negative.   Psychiatric/Behavioral: Negative.     Past medical history:  Past Medical History:  Diagnosis Date  .  Anemia 07/05/2014  . Anxiety and depression 12/14/2008   Qualifier: Diagnosis of  By: [name] LPN, Almyra Free    . Arthritis    "fingers, toes, knees, joints" (07/18/2018)  . Asthma   . Atypical chest pain 05/23/2015  . CAD (coronary artery  disease)    a. [place] with cath 06/2018 s/p DES to LAD.  Marland Kitchen Carotid artery occlusion    a. R carotid occluded, 76-16% LICA.  Marland Kitchen CHF (congestive heart failure) ([place])   . Chicken pox as a child  . Chronic kidney disease    Bright's Disease at age 59   . COPD (chronic obstructive pulmonary disease) ([place])   . GERD (gastroesophageal reflux disease)   . H. pylori infection 10/19/2013  . Hepatitis 1970s   "don't know for sure which one it was; think it was A" (07/18/2018)  . Hyperglycemia 03/14/2016  . Hyperlipidemia   . Hypertension   . Hypothyroidism   . Knee pain, bilateral 12/17/2012  . Left hip pain 05/23/2015  . Low back pain 09/19/2015  . Macular degeneration of both eyes 12/16/2015   Right eye is wet Left Eye is dry Shots every 11 to 12 weeks in right eye at opthamologist office  . Medicare annual wellness visit, subsequent 02/21/2015  . Mumps 73 yrs old  . Pneumonia    "at least once" (07/18/2018)  . Preventative health care 03/14/2016  . Pulmonary emboli ([place]) 05/26/2013  . Sleep apnea    "don't use CPAP anymore; dr said I didn't have to" (07/18/2018)  . Stroke [facility])    "been told I've had some strokes; didn't know I'd had them" (07/18/2018)  . Tremor   . UTI (lower urinary tract infection) 10/19/2013    Past surgical history:  Past Surgical History:  Procedure Laterality Date  . APPENDECTOMY  1984  . CARDIAC CATHETERIZATION  ~ 2006  . CAROTID ENDARTERECTOMY Right 05/10/07   cea  . CATARACT EXTRACTION Left   . CATARACT EXTRACTION W/ INTRAOCULAR LENS  IMPLANT, BILATERAL Bilateral   . CORONARY ANGIOPLASTY WITH STENT PLACEMENT  07/18/2018  . CORONARY STENT INTERVENTION N/A 07/18/2018   Procedure: CORONARY STENT INTERVENTION;  Surgeon: Lorretta Harp, MD;  Location: [facility];  Service: Cardiovascular;  Laterality: N/A;  . INTRAVASCULAR PRESSURE WIRE/FFR STUDY N/A 07/18/2018   Procedure: INTRAVASCULAR PRESSURE WIRE/FFR STUDY;  Surgeon: Lorretta Harp, MD;  Location:  [place] [facility];  Service: Cardiovascular;  Laterality: N/A;  . JOINT REPLACEMENT    . LEFT HEART CATH AND CORONARY ANGIOGRAPHY N/A 07/18/2018   Procedure: LEFT HEART CATH AND CORONARY ANGIOGRAPHY;  Surgeon: Lorretta Harp, MD;  Location: [place] [facility];  Service: Cardiovascular;  Laterality: N/A;  . MULTIPLE TOOTH EXTRACTIONS    . TOTAL KNEE ARTHROPLASTY Bilateral 2005 - 2011   right - left  . VAGINAL HYSTERECTOMY  1984  . WISDOM TOOTH EXTRACTION      Family history: Family History  Problem Relation Age of Onset  . Stroke Mother        mini stroke  . Kidney disease Mother   . Heart failure Mother   . Hypertension Mother   . Diabetes Sister        type 2  . Kidney disease Sister   . Allergic rhinitis Sister   . Heart attack Maternal Grandfather   . Hyperlipidemia Sister     Social history: Social History   Socioeconomic History  . Marital status: Divorced    Spouse name: Not on  file  . Number of children: 2  . Years of education: Not on file  . Highest education level: Not on file  Occupational History  . Not on file  Social Needs  . Financial resource strain: Not on file  . Food insecurity:    Worry: Not on file    Inability: Not on file  . Transportation needs:    Medical: Not on file    Non-medical: Not on file  Tobacco Use  . Smoking status: Former Smoker    Packs/day: 2.00    Years: 30.00    Pack years: 60.00    Types: Cigarettes    Last attempt to quit: 12/03/1994    Years since quitting: 23.7  . Smokeless tobacco: Never Used  Substance and Sexual Activity  . Alcohol use: Yes    Comment: 07/18/2018 "1 drink q 1-2 months; if that"  . Drug use: Never  . Sexual activity: Not Currently    Comment: lives with son and friend, no dietary restrictions  Lifestyle  . Physical activity:    Days per week: Not on file    Minutes per session: Not on file  . Stress: Not on file  Relationships  . Social connections:    Talks on phone: Not on file     Gets together: Not on file    Attends religious service: Not on file    Active member of club or organization: Not on file    Attends meetings of clubs or organizations: Not on file    Relationship status: Not on file  . Intimate partner violence:    Fear of current or ex partner: Not on file    Emotionally abused: Not on file    Physically abused: Not on file    Forced sexual activity: Not on file  Other Topics Concern  . Not on file  Social History Narrative   epworth sleepiness scale = 11 (06/15/2015)   Environmental History: The patient lives in a 73 year old house with carpeting throughout and central air and window air conditioning unit.  There are cats and dogs in the home which do not have access to her bedroom.  There is no known mold/water damage in the home.  She is a former cigarette smoker having smoked from [phone].  Allergies as of 08/14/2018      Reactions   Niaspan [niacin Er] Nausea And Vomiting, Swelling   Swelling in mouth   Pantoprazole    Mouth sores   Bupropion Other (See Comments)   Uncontrollable shakes   Nitroglycerin    NOT allergic - cardiology has recommended she NOT use this due to h/o severe carotid disease as it may decrease cerebral perfusion   Penicillins Hives   DID THE REACTION INVOLVE: Swelling of the face/tongue/throat, SOB, or low BP? Yes Sudden or severe rash/hives, skin peeling, or the inside of the mouth or nose? Unknown Did it require medical treatment? Unknown When did it last happen?teen  If all above answers are "NO", may proceed with cephalosporin use.   Statins    Sulfonamide Derivatives Hives   Apple Rash   Banana Rash      Medication List       Accurate as of August 14, 2018 11:02 AM. Always use your most recent med list.        acetaminophen 500 MG tablet Commonly known as:  TYLENOL Take 500 mg by mouth every 6 (six) hours as needed for moderate pain.   aspirin EC 81  MG tablet Take 1 tablet (81 mg total) by  mouth daily.   budesonide-formoterol 80-4.5 MCG/ACT inhaler Commonly known as:  SYMBICORT Inhale 2 puffs into the lungs 2 (two) times daily.   CALCIUM CITRATE-VITAMIN D PO Take 1 tablet by mouth daily.   cetirizine 10 MG tablet Commonly known as:  ZYRTEC Take 1 tablet (10 mg total) by mouth daily as needed for allergies.   clonazePAM 1 MG tablet Commonly known as:  KLONOPIN TAKE 1 TABLET BY MOUTH THREE TIMES DAILY AS NEEDED FOR ANXIETY   clopidogrel 75 MG tablet Commonly known as:  PLAVIX Take 1 tablet (75 mg total) by mouth daily.   D3 MAXIMUM STRENGTH 125 MCG (5000 UT) capsule Generic drug:  Cholecalciferol Take 5,000 Units by mouth daily.   Dexlansoprazole 30 MG capsule Commonly known as:  DEXILANT Take 1 capsule (30 mg total) by mouth daily. Talk to primary care doctor about continuation of medicine.   ezetimibe 10 MG tablet Commonly known as:  ZETIA Take 1 tablet (10 mg total) by mouth daily.   Fenofibric Acid 135 MG Cpdr TAKE 1 CAPSULE BY MOUTH ONCE DAILY   FISH OIL-KRILL OIL PO Take 1,000 mg by mouth daily.   gabapentin 100 MG capsule Commonly known as:  NEURONTIN TAKE 1 TO 2 CAPSULES BY MOUTH AT BEDTIME   levothyroxine 125 MCG tablet Commonly known as:  SYNTHROID, LEVOTHROID Take 1 tablet (125 mcg total) by mouth daily before breakfast.   losartan 25 MG tablet Commonly known as:  COZAAR TAKE 1 TABLET BY MOUTH ONCE DAILY   montelukast 10 MG tablet Commonly known as:  SINGULAIR Take 1 tablet (10 mg total) by mouth at bedtime as needed.   nitroGLYCERIN 0.4 MG SL tablet Commonly known as:  NITROSTAT Place 1 tablet (0.4 mg total) under the tongue every 5 (five) minutes as needed for chest pain.   sertraline 50 MG tablet Commonly known as:  ZOLOFT Take 1 tablet (50 mg total) by mouth daily.   vitamin B-12 500 MCG tablet Commonly known as:  CYANOCOBALAMIN Take 500 mcg by mouth daily.       Known medication allergies: Allergies  Allergen  Reactions  . Niaspan [Niacin Er] Nausea And Vomiting and Swelling    Swelling in mouth  . Pantoprazole     Mouth sores  . Bupropion Other (See Comments)    Uncontrollable shakes  . Nitroglycerin     NOT allergic - cardiology has recommended she NOT use this due to h/o severe carotid disease as it may decrease cerebral perfusion  . Penicillins Hives    DID THE REACTION INVOLVE: Swelling of the face/tongue/throat, SOB, or low BP? Yes Sudden or severe rash/hives, skin peeling, or the inside of the mouth or nose? Unknown Did it require medical treatment? Unknown When did it last happen?teen  If all above answers are "NO", may proceed with cephalosporin use.  . Statins   . Sulfonamide Derivatives Hives  . Apple Rash  . Banana Rash    I appreciate the opportunity to take part in Jacqulin's care. Please do not hesitate to contact me with questions.  Sincerely,   R. Edgar Frisk, MD

## 2018-08-14 NOTE — Assessment & Plan Note (Addendum)
   The patient is scheduled to return in the near future for allergy skin testing after having been off of antihistamines for at least 3 days.    For now, continue current regimen.

## 2018-08-20 ENCOUNTER — Other Ambulatory Visit: Payer: Self-pay

## 2018-08-20 ENCOUNTER — Ambulatory Visit (HOSPITAL_COMMUNITY)
Admission: RE | Admit: 2018-08-20 | Discharge: 2018-08-20 | Disposition: A | Discharge status: Home / Self Care | Payer: Medicare HMO | Source: Ambulatory Visit | Attending: Surgery | Admitting: Surgery

## 2018-08-20 ENCOUNTER — Encounter (HOSPITAL_COMMUNITY)
Admission: RE | Disposition: A | Discharge status: Home / Self Care | Payer: Self-pay | Source: Ambulatory Visit | Attending: Surgery

## 2018-08-20 ENCOUNTER — Encounter (HOSPITAL_COMMUNITY): Payer: Self-pay | Admitting: Surgery

## 2018-08-20 DIAGNOSIS — Z79899 Other long term (current) drug therapy: Secondary | ICD-10-CM | POA: Diagnosis not present

## 2018-08-20 DIAGNOSIS — K219 Gastro-esophageal reflux disease without esophagitis: Secondary | ICD-10-CM | POA: Diagnosis not present

## 2018-08-20 DIAGNOSIS — I130 Hypertensive heart and chronic kidney disease with heart failure and stage 1 through stage 4 chronic kidney disease, or unspecified chronic kidney disease: Secondary | ICD-10-CM | POA: Insufficient documentation

## 2018-08-20 DIAGNOSIS — Z7982 Long term (current) use of aspirin: Secondary | ICD-10-CM | POA: Diagnosis not present

## 2018-08-20 DIAGNOSIS — Z880 Allergy status to penicillin: Secondary | ICD-10-CM | POA: Diagnosis not present

## 2018-08-20 DIAGNOSIS — E039 Hypothyroidism, unspecified: Secondary | ICD-10-CM | POA: Insufficient documentation

## 2018-08-20 DIAGNOSIS — F329 Major depressive disorder, single episode, unspecified: Secondary | ICD-10-CM | POA: Diagnosis not present

## 2018-08-20 DIAGNOSIS — Z8673 Personal history of transient ischemic attack (TIA), and cerebral infarction without residual deficits: Secondary | ICD-10-CM | POA: Insufficient documentation

## 2018-08-20 DIAGNOSIS — Z955 Presence of coronary angioplasty implant and graft: Secondary | ICD-10-CM | POA: Diagnosis not present

## 2018-08-20 DIAGNOSIS — F419 Anxiety disorder, unspecified: Secondary | ICD-10-CM | POA: Insufficient documentation

## 2018-08-20 DIAGNOSIS — Z86711 Personal history of pulmonary embolism: Secondary | ICD-10-CM | POA: Insufficient documentation

## 2018-08-20 DIAGNOSIS — E785 Hyperlipidemia, unspecified: Secondary | ICD-10-CM | POA: Diagnosis not present

## 2018-08-20 DIAGNOSIS — Z888 Allergy status to other drugs, medicaments and biological substances status: Secondary | ICD-10-CM | POA: Diagnosis not present

## 2018-08-20 DIAGNOSIS — I6523 Occlusion and stenosis of bilateral carotid arteries: Secondary | ICD-10-CM | POA: Diagnosis present

## 2018-08-20 DIAGNOSIS — J449 Chronic obstructive pulmonary disease, unspecified: Secondary | ICD-10-CM | POA: Insufficient documentation

## 2018-08-20 DIAGNOSIS — N189 Chronic kidney disease, unspecified: Secondary | ICD-10-CM | POA: Diagnosis not present

## 2018-08-20 DIAGNOSIS — Z882 Allergy status to sulfonamides status: Secondary | ICD-10-CM | POA: Insufficient documentation

## 2018-08-20 DIAGNOSIS — Z87891 Personal history of nicotine dependence: Secondary | ICD-10-CM | POA: Diagnosis not present

## 2018-08-20 DIAGNOSIS — G4730 Sleep apnea, unspecified: Secondary | ICD-10-CM | POA: Diagnosis not present

## 2018-08-20 DIAGNOSIS — E7800 Pure hypercholesterolemia, unspecified: Secondary | ICD-10-CM | POA: Diagnosis not present

## 2018-08-20 DIAGNOSIS — I509 Heart failure, unspecified: Secondary | ICD-10-CM | POA: Insufficient documentation

## 2018-08-20 DIAGNOSIS — M1990 Unspecified osteoarthritis, unspecified site: Secondary | ICD-10-CM | POA: Insufficient documentation

## 2018-08-20 DIAGNOSIS — I6522 Occlusion and stenosis of left carotid artery: Secondary | ICD-10-CM | POA: Diagnosis not present

## 2018-08-20 DIAGNOSIS — R69 Illness, unspecified: Secondary | ICD-10-CM | POA: Diagnosis not present

## 2018-08-20 DIAGNOSIS — I2510 Atherosclerotic heart disease of native coronary artery without angina pectoris: Secondary | ICD-10-CM | POA: Insufficient documentation

## 2018-08-20 HISTORY — PX: CAROTID ANGIOGRAPHY: CATH118230

## 2018-08-20 LAB — BASIC METABOLIC PANEL
Anion gap: 7 (ref 5–15)
BUN: 39 mg/dL — ABNORMAL HIGH (ref 8–23)
CALCIUM: 9.8 mg/dL (ref 8.9–10.3)
CO2: 25 mmol/L (ref 22–32)
Chloride: 108 mmol/L (ref 98–111)
Creatinine, Ser: 1.94 mg/dL — ABNORMAL HIGH (ref 0.44–1.00)
GFR calc Af Amer: 30 mL/min — ABNORMAL LOW (ref >60)
GFR calc non Af Amer: 26 mL/min — ABNORMAL LOW (ref >60)
Glucose, Bld: 97 mg/dL (ref 70–99)
Potassium: 4.1 mmol/L (ref 3.5–5.1)
Sodium: 140 mmol/L (ref 135–145)

## 2018-08-20 LAB — CBC
HCT: 35.3 % — ABNORMAL LOW (ref 36.0–46.0)
Hemoglobin: 11.2 g/dL — ABNORMAL LOW (ref 12.0–15.0)
MCH: 29 pg (ref 26.0–34.0)
MCHC: 31.7 g/dL (ref 30.0–36.0)
MCV: 91.5 fL (ref 80.0–100.0)
Platelets: 247 10*3/uL (ref 150–400)
RBC: 3.86 MIL/uL — ABNORMAL LOW (ref 3.87–5.11)
RDW: 13.2 % (ref 11.5–15.5)
WBC: 6.7 10*3/uL (ref 4.0–10.5)
nRBC: 0 % (ref 0.0–0.2)

## 2018-08-20 SURGERY — CAROTID ANGIOGRAPHY
Anesthesia: LOCAL | Laterality: Left

## 2018-08-20 MED ORDER — IODIXANOL 320 MG/ML IV SOLN
INTRAVENOUS | Status: DC | PRN
  Administered 2018-08-20: 21 mL via INTRA_ARTERIAL

## 2018-08-20 MED ORDER — HYDRALAZINE HCL 20 MG/ML IJ SOLN: 5.0000 mg | INTRAMUSCULAR | Status: DC | PRN

## 2018-08-20 MED ORDER — SODIUM CHLORIDE 0.9 % IV SOLN: 250.0000 mL | INTRAVENOUS | Status: DC | PRN

## 2018-08-20 MED ORDER — ACETAMINOPHEN 325 MG PO TABS
650.0000 mg | ORAL_TABLET | ORAL | Status: DC | PRN
  Filled 2018-08-20: qty 2

## 2018-08-20 MED ORDER — LIDOCAINE HCL (PF) 1 % IJ SOLN
INTRAMUSCULAR | Status: DC | PRN
  Administered 2018-08-20: 15 mL

## 2018-08-20 MED ORDER — MORPHINE SULFATE (PF) 10 MG/ML IV SOLN: 1.0000 mg | INTRAVENOUS | Status: DC | PRN

## 2018-08-20 MED ORDER — SODIUM CHLORIDE 0.9 % WEIGHT BASED INFUSION: 1.0000 mL/kg/h | INTRAVENOUS | Status: DC

## 2018-08-20 MED ORDER — SODIUM CHLORIDE 0.9% FLUSH: 3.0000 mL | INTRAVENOUS | Status: DC | PRN

## 2018-08-20 MED ORDER — ONDANSETRON HCL 4 MG/2ML IJ SOLN: 4.0000 mg | Freq: Four times a day (QID) | INTRAMUSCULAR | Status: DC | PRN

## 2018-08-20 MED ORDER — SODIUM CHLORIDE 0.9% FLUSH: 3.0000 mL | Freq: Two times a day (BID) | INTRAVENOUS | Status: DC

## 2018-08-20 MED ORDER — SODIUM CHLORIDE 0.9 % IV SOLN
INTRAVENOUS | Status: DC
  Administered 2018-08-20: 07:00:00 via INTRAVENOUS

## 2018-08-20 MED ORDER — HEPARIN (PORCINE) IN NACL 1000-0.9 UT/500ML-% IV SOLN
INTRAVENOUS | Status: DC | PRN
  Administered 2018-08-20 (×2): 500 mL

## 2018-08-20 MED ORDER — LABETALOL HCL 5 MG/ML IV SOLN: 10.0000 mg | INTRAVENOUS | Status: DC | PRN

## 2018-08-20 MED ORDER — OXYCODONE HCL 5 MG PO TABS: 5.0000 mg | ORAL_TABLET | ORAL | Status: DC | PRN

## 2018-08-20 SURGICAL SUPPLY — 11 items
CATH ANGIO 5F BER 100CM (CATHETERS) ×2 IMPLANT
DEVICE CLOSURE MYNXGRIP 5F (Vascular Products) ×2 IMPLANT
KIT MICROPUNCTURE NIT STIFF (SHEATH) ×2 IMPLANT
KIT PV (KITS) ×2 IMPLANT
SHEATH PINNACLE 5F 10CM (SHEATH) ×2 IMPLANT
SHEATH PROBE COVER 6X72 (BAG) ×2 IMPLANT
STOPCOCK MORSE 400PSI 3WAY (MISCELLANEOUS) ×2 IMPLANT
SYR MEDRAD MARK V 150ML (SYRINGE) ×2 IMPLANT
TRANSDUCER W/STOPCOCK (MISCELLANEOUS) ×2 IMPLANT
TRAY PV CATH (CUSTOM PROCEDURE TRAY) ×2 IMPLANT
WIRE BENTSON .035X145CM (WIRE) ×2 IMPLANT

## 2018-08-20 NOTE — Discharge Instructions (Signed)
Drink plenty of fluids for the next 48 hours  Femoral Site Care This sheet gives you information about how to care for yourself after your procedure. Your health care provider may also give you more specific instructions. If you have problems or questions, contact your health care provider. What can I expect after the procedure? After the procedure, it is common to have:  Bruising that usually fades within 1-2 weeks.  Tenderness at the site. Follow these instructions at home: Wound care  Follow instructions from your health care provider about how to take care of your insertion site. Make sure you: ? Wash your hands with soap and water before you change your bandage (dressing). If soap and water are not available, use hand sanitizer. ? Change your dressing as told by your health care provider. ? Leave stitches (sutures), skin glue, or adhesive strips in place. These skin closures may need to stay in place for 2 weeks or longer. If adhesive strip edges start to loosen and curl up, you may trim the loose edges. Do not remove adhesive strips completely unless your health care provider tells you to do that.  Do not take baths, swim, or use a hot tub until your health care provider approves.  You may shower 24-48 hours after the procedure or as told by your health care provider. ? Gently wash the site with plain soap and water. ? Pat the area dry with a clean towel. ? Do not rub the site. This may cause bleeding.  Do not apply powder or lotion to the site. Keep the site clean and dry.  Check your femoral site every day for signs of infection. Check for: ? Redness, swelling, or pain. ? Fluid or blood. ? Warmth. ? Pus or a bad smell. Activity  For the first 2-3 days after your procedure, or as long as directed: ? Avoid climbing stairs as much as possible. ? Do not squat.  Do not lift anything that is heavier than 10 lb (4.5 kg), or the limit that you are told, until your health care  provider says that it is safe.  Rest as directed. ? Avoid sitting for a long time without moving. Get up to take short walks every 1-2 hours.  Do not drive for 24 hours if you were given a medicine to help you relax (sedative). General instructions  Take over-the-counter and prescription medicines only as told by your health care provider.  Keep all follow-up visits as told by your health care provider. This is important. Contact a health care provider if you have:  A fever or chills.  You have redness, swelling, or pain around your insertion site. Get help right away if:  The catheter insertion area swells very fast.  You pass out.  You suddenly start to sweat or your skin gets clammy.  The catheter insertion area is bleeding, and the bleeding does not stop when you hold steady pressure on the area.  The area near or just beyond the catheter insertion site becomes pale, cool, tingly, or numb. These symptoms may represent a serious problem that is an emergency. Do not wait to see if the symptoms will go away. Get medical help right away. Call your local emergency services (911 in the [place] Do not drive yourself to the hospital. Summary  After the procedure, it is common to have bruising that usually fades within 1-2 weeks.  Check your femoral site every day for signs of infection.  Do not lift anything  that is heavier than 10 lb (4.5 kg), or the limit that you are told, until your health care provider says that it is safe. This information is not intended to replace advice given to you by your health care provider. Make sure you discuss any questions you have with your health care provider. Document Released: 03/20/2014 Document Revised: 07/30/2017 Document Reviewed: 07/30/2017 Elsevier Interactive Patient Education  2019 [employer].

## 2018-08-20 NOTE — Op Note (Signed)
Patient name: Emily Mitchell MRN: [id number] DOB: 03/29/1946 Sex: female  08/20/2018 Pre-operative Diagnosis: Carotid stenosis Post-operative diagnosis:  Same    Patient name: Emily Mitchell MRN: [id number] DOB: 1946/03/21 Sex: female  08/20/2018 Pre-operative Diagnosis: Carotid stenosis Post-operative diagnosis:  Same Surgeon:  Annamarie Major Procedure Performed:  1.  Ultrasound-guided access, right femoral artery  2.  First order catheterization (left carotid artery  3.  Left carotid angiogram  4.  Closure device ([employer])   Indications: Patient has a known occlusion of her right carotid artery.  She came to my office with an outside ultrasound showing greater than 70% stenosis.  This was repeated in my office and was found to be less than 40%.  She has elevated creatinine.  I felt like she needed additional imaging to determine the true degree of her stenosis.  I felt carotid angiography was her best option.  Procedure:  The patient was identified in the holding area and taken to room 8.  The patient was then placed supine on the table and prepped and draped in the usual sterile fashion.  A time out was called.  Ultrasound was used to evaluate the right common femoral artery.  It was patent .  A digital ultrasound image was acquired.  A micropuncture needle was used to access the right common femoral artery under ultrasound guidance.  An 018 wire was advanced without resistance and a micropuncture sheath was placed.  The 018 wire was removed and a benson wire was placed.  The micropuncture sheath was exchanged for a 5 french sheath.  Using a Berenstein catheter and a Bentson wire, the left carotid artery was selected and a left carotid angiogram was performed.  Findings:   Left carotid artery: The left common carotid artery is widely patent without stenosis.  The left external carotid artery is widely patent without stenosis.  The left internal carotid artery is patent with less than 40%  stenosis.  Intervention: None  Impression:  #1 no significant left carotid stenosis.    Theotis Burrow, M.D. [facility] Office: [phone] Pager:  [phone]  Indications: Patient has a known occlusion of her right carotid artery.  She came to my office with an outside ultrasound showing greater than 70% stenosis.  This was repeated in my office and was found to be less than 40%.  She has elevated creatinine.  I felt like she needed additional imaging to determine the true degree of her stenosis.  I felt carotid angiography was her best option.  Procedure:  The patient was identified in the holding area and taken to room 8.  The patient was then placed supine on the table and prepped and draped in the usual sterile fashion.  A time out was called.  Ultrasound was used to evaluate the right common femoral artery.  It was patent .  A digital ultrasound image was acquired.  A micropuncture needle was used to access the right common femoral artery under ultrasound guidance.  An 018 wire was advanced without resistance and a micropuncture sheath was placed.  The 018 wire was removed and a benson wire was placed.  The micropuncture sheath was exchanged for a 5 french sheath.  Using a Berenstein catheter and a Bentson wire, the left carotid artery was selected and a left carotid angiogram was performed.  Findings:   Left carotid artery: The left common carotid artery is widely patent without stenosis.  The left external carotid artery  is widely patent without stenosis.  The left internal carotid artery is patent with less than 40% stenosis.  Intervention: None  Impression:  #1 no significant left carotid stenosis.    Theotis Burrow, M.D. [facility] Office: [phone] Pager:  [phone]

## 2018-08-20 NOTE — Progress Notes (Addendum)
Additional manual pressure held to R groin for <1 cm hematoma for 15 minutes.  Hematoma now resolved.  Pt was mynxed.  Will continue to monitor.   Claudette Stapler, RN

## 2018-08-20 NOTE — Interval H&P Note (Signed)
History and Physical Interval Note:  08/20/2018 8:00 AM  Emily Mitchell  has presented today for surgery, with the diagnosis of Bilat Coratid stenosis  The various methods of treatment have been discussed with the patient and family. After consideration of risks, benefits and other options for treatment, the patient has consented to  Procedure(s): CAROTID ANGIOGRAPHY (Left) as a surgical intervention .  The patient's history has been reviewed, patient examined, no change in status, stable for surgery.  I have reviewed the patient's chart and labs.  Questions were answered to the patient's satisfaction.     Annamarie Major

## 2018-08-20 NOTE — Progress Notes (Signed)
Pt c/o slight nausea, refused Zofran, said she felt fine enough to dress.

## 2018-08-22 DIAGNOSIS — N189 Chronic kidney disease, unspecified: Secondary | ICD-10-CM | POA: Diagnosis not present

## 2018-08-22 DIAGNOSIS — N183 Chronic kidney disease, stage 3 (moderate): Secondary | ICD-10-CM | POA: Diagnosis not present

## 2018-08-23 ENCOUNTER — Other Ambulatory Visit: Payer: Self-pay | Admitting: Family Medicine

## 2018-08-23 DIAGNOSIS — E785 Hyperlipidemia, unspecified: Secondary | ICD-10-CM

## 2018-08-23 NOTE — Telephone Encounter (Signed)
Requesting:Klonopin Contract:no UDS:no Last OV:07/08/18 Next OV:11/12/18 Last Refill:01/09/18  #[id number] Database:   Please advise

## 2018-08-26 DIAGNOSIS — E785 Hyperlipidemia, unspecified: Secondary | ICD-10-CM | POA: Diagnosis not present

## 2018-08-26 DIAGNOSIS — I2699 Other pulmonary embolism without acute cor pulmonale: Secondary | ICD-10-CM | POA: Diagnosis not present

## 2018-08-26 DIAGNOSIS — D631 Anemia in chronic kidney disease: Secondary | ICD-10-CM | POA: Diagnosis not present

## 2018-08-26 DIAGNOSIS — I129 Hypertensive chronic kidney disease with stage 1 through stage 4 chronic kidney disease, or unspecified chronic kidney disease: Secondary | ICD-10-CM | POA: Diagnosis not present

## 2018-08-26 DIAGNOSIS — N179 Acute kidney failure, unspecified: Secondary | ICD-10-CM | POA: Diagnosis not present

## 2018-08-26 DIAGNOSIS — N2581 Secondary hyperparathyroidism of renal origin: Secondary | ICD-10-CM | POA: Diagnosis not present

## 2018-08-26 DIAGNOSIS — N183 Chronic kidney disease, stage 3 (moderate): Secondary | ICD-10-CM | POA: Diagnosis not present

## 2018-08-26 DIAGNOSIS — E669 Obesity, unspecified: Secondary | ICD-10-CM | POA: Diagnosis not present

## 2018-08-26 DIAGNOSIS — I509 Heart failure, unspecified: Secondary | ICD-10-CM | POA: Diagnosis not present

## 2018-08-29 ENCOUNTER — Ambulatory Visit: Payer: Medicare HMO | Admitting: Family Medicine

## 2018-08-29 ENCOUNTER — Encounter: Payer: Self-pay | Admitting: Family Medicine

## 2018-08-29 VITALS — BP 118/62 | HR 74 | Temp 98.2°F | Resp 24

## 2018-08-29 DIAGNOSIS — J3089 Other allergic rhinitis: Secondary | ICD-10-CM

## 2018-08-29 DIAGNOSIS — J449 Chronic obstructive pulmonary disease, unspecified: Secondary | ICD-10-CM | POA: Diagnosis not present

## 2018-08-29 DIAGNOSIS — J302 Other seasonal allergic rhinitis: Secondary | ICD-10-CM | POA: Diagnosis not present

## 2018-08-29 DIAGNOSIS — Z91018 Allergy to other foods: Secondary | ICD-10-CM | POA: Diagnosis not present

## 2018-08-29 DIAGNOSIS — J4489 Other specified chronic obstructive pulmonary disease: Secondary | ICD-10-CM

## 2018-08-29 NOTE — Progress Notes (Addendum)
[place] [address] Dept: [phone]  FOLLOW UP NOTE  Patient ID: Emily Mitchell, female    DOB: June 18, 1946  Age: 73 y.o. MRN: [id number] Date of Office Visit: 08/29/2018  Assessment  Chief Complaint: Allergy Testing (foods and enviro)  HPI Emily Mitchell is a 73 year old female who presents to the clinic for a follow up with allergy testing. She reports she is breathing well with no shortness of breath, wheeze, or cough. She continues on Symbicort 80-2 puffs twice a day and montelukast 10 mg once a day. She has not taken antihistamine for the last 3 days. Her current medications are listed in the chart.    Drug Allergies:  Allergies  Allergen Reactions  . Niaspan [Niacin Er] Nausea And Vomiting and Swelling    Swelling in mouth  . Pantoprazole     Mouth sores  . Bupropion Other (See Comments)    Uncontrollable shakes  . Nitroglycerin     NOT allergic - cardiology has recommended she NOT use this due to h/o severe carotid disease as it may decrease cerebral perfusion  . Penicillins Hives    DID THE REACTION INVOLVE: Swelling of the face/tongue/throat, SOB, or low BP? Yes Sudden or severe rash/hives, skin peeling, or the inside of the mouth or nose? Unknown Did it require medical treatment? Unknown When did it last happen?teen  If all above answers are "NO", may proceed with cephalosporin use.  . Statins   . Sulfonamide Derivatives Hives  . Apple Rash  . Banana Rash    Physical Exam: BP 118/62   Pulse 74   Temp 98.2 F (36.8 C) (Oral)   Resp (!) 24   SpO2 96%    Physical Exam Vitals signs reviewed.  Constitutional:      Appearance: Normal appearance.  HENT:     Head: Normocephalic and atraumatic.     Right Ear: Tympanic membrane normal.     Left Ear: Tympanic membrane normal.     Nose:     Comments: Bilateral nares slightly erythematous with clear nasal drainage noted. Pharynx slightly erythematous with no exudate noted. Eyes normal.  Ears normal. Eyes:     Conjunctiva/sclera: Conjunctivae normal.  Neck:     Musculoskeletal: Normal range of motion and neck supple.  Cardiovascular:     Rate and Rhythm: Normal rate and regular rhythm.     Heart sounds: Normal heart sounds. No murmur.  Pulmonary:     Effort: Pulmonary effort is normal.     Breath sounds: Normal breath sounds.  Musculoskeletal: Normal range of motion.  Skin:    General: Skin is warm and dry.  Neurological:     Mental Status: She is alert and oriented to person, place, and time.  Psychiatric:        Mood and Affect: Mood normal.        Behavior: Behavior normal.        Thought Content: Thought content normal.        Judgment: Judgment normal.     Diagnostics: FVC 1.68, FEV1 1.17.  Predicted FVC 2.87, predicted FEV1 2.17.  Spirometry indicates moderate restriction.  This is consistent with previous spirometry readings.  Percutaneous environmental skin testing positive to mold and dog epithelia with adequate controls.  Percutaneous selected food testing was negative to apple and banana with adequate controls.  Assessment and Plan: 1. History of food allergy   2. Seasonal and perennial allergic rhinitis   3. COPD with  asthma [facility])      Patient Instructions  History of food allergy Your allergy skin testing today was negtive to banana and apple Return to the office for a food challenge to banana first. We can consider a food challenge to apple after that. Continue to avoid apples and banana until you come into the office for a food challenge to these foods. In case of an allergic reaction, take Benadryl 50 mg every 4 hours, and if life-threatening symptoms occur, inject with EpiPen 0.3 mg.  Chronic rhinitis Your environmental allergy testing was positive to mold and dog  Avoidance measures provided Consider nasal saline rinses as needed for nasal symptoms  COPD with asthma (HCC) Continue Symbicort 80-2 puffs twice a day and montelukast 10  mg once a day as prescribed by your pulmonologist Follow up with your regularly scheduled visit with Dr. Elsworth Soho  Call the clinic if this treatment plan is not working well for you  Follow up in 6 months or sooner if needed   Return in about 6 months (around 02/27/2019), or if symptoms worsen or fail to improve.    Thank you for the opportunity to care for this patient.  Please do not hesitate to contact me with questions.  Gareth Morgan, FNP [facility] [place]  _________________________________________________  I have provided oversight concerning [name]'s evaluation and treatment of this patient's health issues addressed during today's encounter.  I agree with the assessment and therapeutic plan as outlined in the note.   Signed,   R Edgar Frisk, MD

## 2018-08-29 NOTE — Patient Instructions (Addendum)
History of food allergy Your allergy skin testing today was negtive to banana and apple Return to the office for a food challenge to banana first. We can consider a food challenge to apple after that. Continue to avoid apples and banana until you come into the office for a food challenge to these foods. In case of an allergic reaction, take Benadryl 50 mg every 4 hours, and if life-threatening symptoms occur, inject with EpiPen 0.3 mg.  Chronic rhinitis Your environmental allergy testing was positive to mold and dog  Avoidance measures provided Consider nasal saline rinses as needed for nasal symptoms  COPD with asthma (HCC) Continue Symbicort 80-2 puffs twice a day and montelukast 10 mg once a day as prescribed by your pulmonologist Follow up with your regularly scheduled visit with Dr. Elsworth Soho  Call the clinic if this treatment plan is not working well for you  Follow up in 6 months or sooner if needed

## 2018-09-04 ENCOUNTER — Emergency Department (HOSPITAL_BASED_OUTPATIENT_CLINIC_OR_DEPARTMENT_OTHER)
Admission: EM | Admit: 2018-09-04 | Discharge: 2018-09-05 | Disposition: A | Discharge status: Home / Self Care | Payer: Medicare HMO | Attending: Emergency Medicine | Admitting: Emergency Medicine

## 2018-09-04 ENCOUNTER — Other Ambulatory Visit: Payer: Self-pay

## 2018-09-04 ENCOUNTER — Ambulatory Visit: Payer: Medicare HMO | Admitting: Family Medicine

## 2018-09-04 ENCOUNTER — Encounter: Payer: Self-pay | Admitting: Family Medicine

## 2018-09-04 ENCOUNTER — Encounter (HOSPITAL_BASED_OUTPATIENT_CLINIC_OR_DEPARTMENT_OTHER): Payer: Self-pay | Admitting: Emergency Medicine

## 2018-09-04 ENCOUNTER — Emergency Department (HOSPITAL_BASED_OUTPATIENT_CLINIC_OR_DEPARTMENT_OTHER): Payer: Medicare HMO

## 2018-09-04 VITALS — BP 144/66 | HR 66 | Temp 97.9°F | Resp 20

## 2018-09-04 DIAGNOSIS — I509 Heart failure, unspecified: Secondary | ICD-10-CM | POA: Diagnosis not present

## 2018-09-04 DIAGNOSIS — J449 Chronic obstructive pulmonary disease, unspecified: Secondary | ICD-10-CM | POA: Insufficient documentation

## 2018-09-04 DIAGNOSIS — R0602 Shortness of breath: Secondary | ICD-10-CM | POA: Diagnosis not present

## 2018-09-04 DIAGNOSIS — N184 Chronic kidney disease, stage 4 (severe): Secondary | ICD-10-CM | POA: Diagnosis not present

## 2018-09-04 DIAGNOSIS — E039 Hypothyroidism, unspecified: Secondary | ICD-10-CM | POA: Diagnosis not present

## 2018-09-04 DIAGNOSIS — Z955 Presence of coronary angioplasty implant and graft: Secondary | ICD-10-CM | POA: Diagnosis not present

## 2018-09-04 DIAGNOSIS — Z79899 Other long term (current) drug therapy: Secondary | ICD-10-CM | POA: Insufficient documentation

## 2018-09-04 DIAGNOSIS — I2510 Atherosclerotic heart disease of native coronary artery without angina pectoris: Secondary | ICD-10-CM | POA: Insufficient documentation

## 2018-09-04 DIAGNOSIS — Z91018 Allergy to other foods: Secondary | ICD-10-CM

## 2018-09-04 DIAGNOSIS — I440 Atrioventricular block, first degree: Secondary | ICD-10-CM | POA: Diagnosis not present

## 2018-09-04 DIAGNOSIS — Z87891 Personal history of nicotine dependence: Secondary | ICD-10-CM | POA: Insufficient documentation

## 2018-09-04 DIAGNOSIS — I130 Hypertensive heart and chronic kidney disease with heart failure and stage 1 through stage 4 chronic kidney disease, or unspecified chronic kidney disease: Secondary | ICD-10-CM | POA: Insufficient documentation

## 2018-09-04 DIAGNOSIS — Z96652 Presence of left artificial knee joint: Secondary | ICD-10-CM | POA: Diagnosis not present

## 2018-09-04 DIAGNOSIS — Z7982 Long term (current) use of aspirin: Secondary | ICD-10-CM | POA: Insufficient documentation

## 2018-09-04 LAB — CBC WITH DIFFERENTIAL/PLATELET
Abs Immature Granulocytes: 0.02 10*3/uL (ref 0.00–0.07)
Basophils Absolute: 0 10*3/uL (ref 0.0–0.1)
Basophils Relative: 1 %
Eosinophils Absolute: 0.2 10*3/uL (ref 0.0–0.5)
Eosinophils Relative: 2 %
HEMATOCRIT: 32.3 % — AB (ref 36.0–46.0)
Hemoglobin: 10.1 g/dL — ABNORMAL LOW (ref 12.0–15.0)
Immature Granulocytes: 0 %
Lymphocytes Relative: 25 %
Lymphs Abs: 1.9 10*3/uL (ref 0.7–4.0)
MCH: 28.9 pg (ref 26.0–34.0)
MCHC: 31.3 g/dL (ref 30.0–36.0)
MCV: 92.6 fL (ref 80.0–100.0)
Monocytes Absolute: 0.6 10*3/uL (ref 0.1–1.0)
Monocytes Relative: 8 %
Neutro Abs: 4.8 10*3/uL (ref 1.7–7.7)
Neutrophils Relative %: 64 %
Platelets: 283 10*3/uL (ref 150–400)
RBC: 3.49 MIL/uL — ABNORMAL LOW (ref 3.87–5.11)
RDW: 13.5 % (ref 11.5–15.5)
WBC: 7.5 10*3/uL (ref 4.0–10.5)
nRBC: 0 % (ref 0.0–0.2)

## 2018-09-04 LAB — COMPREHENSIVE METABOLIC PANEL
ALK PHOS: 45 U/L (ref 38–126)
ALT: 15 U/L (ref 0–44)
AST: 24 U/L (ref 15–41)
Albumin: 3.7 g/dL (ref 3.5–5.0)
Anion gap: 6 (ref 5–15)
BUN: 41 mg/dL — ABNORMAL HIGH (ref 8–23)
CO2: 24 mmol/L (ref 22–32)
Calcium: 9.2 mg/dL (ref 8.9–10.3)
Chloride: 106 mmol/L (ref 98–111)
Creatinine, Ser: 2.24 mg/dL — ABNORMAL HIGH (ref 0.44–1.00)
GFR calc Af Amer: 25 mL/min — ABNORMAL LOW (ref >60)
GFR calc non Af Amer: 22 mL/min — ABNORMAL LOW (ref >60)
Glucose, Bld: 102 mg/dL — ABNORMAL HIGH (ref 70–99)
Potassium: 4.3 mmol/L (ref 3.5–5.1)
SODIUM: 136 mmol/L (ref 135–145)
Total Bilirubin: 0.4 mg/dL (ref 0.3–1.2)
Total Protein: 6.6 g/dL (ref 6.5–8.1)

## 2018-09-04 LAB — TROPONIN I: Troponin I: <0.03 ng/mL (ref <0.03)

## 2018-09-04 LAB — BRAIN NATRIURETIC PEPTIDE: B Natriuretic Peptide: 38.9 pg/mL (ref 0.0–100.0)

## 2018-09-04 NOTE — Patient Instructions (Addendum)
Food allergy Dannica Bickham was able to tolerate the banana food challenge today at the office without adverse signs or symptoms of an allergic reaction. Therefore, she has the same risk of systemic reaction associated with the consumption of banana as the general population.  - Do not eat any banana for the next 24 hours. - Monitor for allergic symptoms such as rash, wheezing, diarrhea, swelling, and vomiting for the next 24 hours. If severe symptoms occur, treat with EpiPen injection and call 911. For less severe symptoms treat with Benadryl 50 mg and call the clinic. - If no allergic symptoms are evident, reintroduce banana into the diet, 1-2 servings a day. If you develop an allergic reaction to banana, record what was eaten the amount eaten, preparation method, time from ingestion to reaction, and symptoms.   Call the clinic if this treatment plan is not working well for you  Follow up in 6 months or sooner if needed

## 2018-09-04 NOTE — ED Triage Notes (Addendum)
SOB for a few weeks, states ," I just can' deal with it anymore" O2 sat 100%RA while sitting at registration desk . Was seen in office today for allergy test. Also with multiple c/o's since having a stent placed  in Dec 2019

## 2018-09-04 NOTE — ED Provider Notes (Signed)
[facility] EMERGENCY DEPARTMENT Provider Note   CSN: [id number] Arrival date & time: 09/04/18  1928     History   Chief Complaint Chief Complaint  Patient presents with  . Shortness of Breath    HPI Emily Mitchell is a 73 y.o. female.  73yo F w/ PMH including CAD s/p stenting, CKD, COPD, CHF, PE, OSA who p/w shortness of breath.  Patient reports ongoing problems with shortness of breath ever since she had stent placement in December 2019.  She notes that shortness of breath is worse with any exertion.  Family states that they think her shortness of breath began when she lost blood post procedurally while in the hospital and that she has never been able to regain her strength since then. She reports some chest tightness when she lies back occasionally. She reports b/l ankle swelling and some SOB at night. No bloody stools/black stools. No cough/cold symptoms, fevers, or recent changes to medications.  She is compliant with medications.  She follows with a kidney doctor, has not seen cardiologist since stenting.  The history is provided by the patient.  Shortness of Breath    Past Medical History:  Diagnosis Date  . Anemia 07/05/2014  . Anxiety and depression 12/14/2008   Qualifier: Diagnosis of  By: [name] LPN, Almyra Free    . Arthritis    "fingers, toes, knees, joints" (07/18/2018)  . Asthma   . Atypical chest pain 05/23/2015  . CAD (coronary artery disease)    a. [place] with cath 06/2018 s/p DES to LAD.  Marland Kitchen Carotid artery occlusion    a. R carotid occluded, 00-51% LICA.  Marland Kitchen CHF (congestive heart failure) ([place])   . Chicken pox as a child  . Chronic kidney disease    Bright's Disease at age 33   . COPD (chronic obstructive pulmonary disease) ([place])   . GERD (gastroesophageal reflux disease)   . H. pylori infection 10/19/2013  . Hepatitis 1970s   "don't know for sure which one it was; think it was A" (07/18/2018)  . Hyperglycemia 03/14/2016  . Hyperlipidemia   .  Hypertension   . Hypothyroidism   . Knee pain, bilateral 12/17/2012  . Left hip pain 05/23/2015  . Low back pain 09/19/2015  . Macular degeneration of both eyes 12/16/2015   Right eye is wet Left Eye is dry Shots every 11 to 12 weeks in right eye at opthamologist office  . Medicare annual wellness visit, subsequent 02/21/2015  . Mumps 73 yrs old  . Pneumonia    "at least once" (07/18/2018)  . Preventative health care 03/14/2016  . Pulmonary emboli ([place]) 05/26/2013  . Sleep apnea    "don't use CPAP anymore; dr said I didn't have to" (07/18/2018)  . Stroke [facility])    "been told I've had some strokes; didn't know I'd had them" (07/18/2018)  . Tremor   . UTI (lower urinary tract infection) 10/19/2013    Patient Active Problem List   Diagnosis Date Noted  . History of food allergy 08/14/2018  . CAD (coronary artery disease) 07/25/2018  . CAD in native artery 07/19/2018  . Chest pain 07/18/2018  . History of pulmonary embolism 07/17/2018  . Renal insufficiency 07/17/2018  . H/O multiple allergies 07/14/2018  . Chronic rhinitis 06/14/2018  . Vitamin D deficiency 03/03/2018  . Candidal skin infection 02/28/2018  . COPD with asthma ([place]) 11/06/2017  . Lymphadenopathy 10/18/2017  . Right shoulder pain 10/18/2017  . Neck pain 04/22/2017  . Hyperglycemia 03/14/2016  .  Preventative health care 03/14/2016  . Macular degeneration of both eyes 12/16/2015  . Essential hypertension 10/18/2015  . Exudative age-related macular degeneration of right eye with active choroidal neovascularization ([place]) 08/26/2015  . Carotid artery disease ([employer]) 06/16/2015  . Left hip pain 05/23/2015  . Medicare annual wellness visit, subsequent 02/21/2015  . Anemia 07/05/2014  . Essential and other specified forms of tremor 11/05/2013  . Lower urinary tract infectious disease 10/19/2013  . Hyperlipidemia, mixed 05/26/2013  . Pulmonary emboli ([place]) 05/26/2013  . Arthritis   . CKD (chronic kidney disease), stage IV  ([place])   . Hypothyroidism   . CHF (congestive heart failure) ([place])   . Occlusion and stenosis of carotid artery without mention of cerebral infarction 12/17/2012  . Knee pain, bilateral 12/17/2012  . Numbness of finger 12/17/2012  . Anxiety and depression 12/14/2008  . OSA (obstructive sleep apnea) 12/14/2008    Past Surgical History:  Procedure Laterality Date  . APPENDECTOMY  1984  . CARDIAC CATHETERIZATION  ~ 2006  . CAROTID ANGIOGRAPHY Left 08/20/2018   Procedure: CAROTID ANGIOGRAPHY;  Surgeon: Serafina Mitchell, MD;  Location: [facility] CV LAB;  Service: Cardiovascular;  Laterality: Left;  . CAROTID ENDARTERECTOMY Right 05/10/07   cea  . CATARACT EXTRACTION Left   . CATARACT EXTRACTION W/ INTRAOCULAR LENS  IMPLANT, BILATERAL Bilateral   . CORONARY ANGIOPLASTY WITH STENT PLACEMENT  07/18/2018  . CORONARY STENT INTERVENTION N/A 07/18/2018   Procedure: CORONARY STENT INTERVENTION;  Surgeon: Lorretta Harp, MD;  Location: [facility];  Service: Cardiovascular;  Laterality: N/A;  . INTRAVASCULAR PRESSURE WIRE/FFR STUDY N/A 07/18/2018   Procedure: INTRAVASCULAR PRESSURE WIRE/FFR STUDY;  Surgeon: Lorretta Harp, MD;  Location: [facility];  Service: Cardiovascular;  Laterality: N/A;  . JOINT REPLACEMENT    . LEFT HEART CATH AND CORONARY ANGIOGRAPHY N/A 07/18/2018   Procedure: LEFT HEART CATH AND CORONARY ANGIOGRAPHY;  Surgeon: Lorretta Harp, MD;  Location: [facility];  Service: Cardiovascular;  Laterality: N/A;  . MULTIPLE TOOTH EXTRACTIONS    . TOTAL KNEE ARTHROPLASTY Bilateral 2005 - 2011   right - left  . VAGINAL HYSTERECTOMY  1984  . WISDOM TOOTH EXTRACTION       OB History   No obstetric history on file.      Home Medications    Prior to Admission medications   Medication Sig Start Date End Date Taking? Authorizing Provider  acetaminophen (TYLENOL) 500 MG tablet Take 500 mg by mouth every 6 (six) hours as needed for moderate pain.      [provider]  albuterol (PROVENTIL HFA;VENTOLIN HFA) 108 (90 Base) MCG/ACT inhaler Inhale 2 puffs into the lungs every 6 (six) hours as needed for wheezing or shortness of breath.    [provider]  aspirin EC 81 MG tablet Take 1 tablet (81 mg total) by mouth daily. 07/17/18   Revankar, Reita Cliche, MD  budesonide-formoterol (SYMBICORT) 80-4.5 MCG/ACT inhaler Inhale 2 puffs into the lungs 2 (two) times daily. 03/13/18   Rigoberto Noel, MD  CALCIUM CITRATE-VITAMIN D PO Take 1 tablet by mouth daily.     [provider]  cetirizine (ZYRTEC) 10 MG tablet Take 1 tablet (10 mg total) by mouth daily as needed for allergies. Patient not taking: Reported on 08/29/2018 01/16/17   Mosie Lukes, MD  Cholecalciferol (D3 MAXIMUM STRENGTH) 5000 UNITS capsule Take 5,000 Units by mouth daily.    [provider]  Choline Fenofibrate (FENOFIBRIC ACID)  [place] ONCE DAILY Patient taking differently: Take 135 mg by mouth daily.  03/19/18   Mosie Lukes, MD  clonazePAM (KLONOPIN) 1 MG tablet TAKE 1 TABLET BY MOUTH THREE TIMES DAILY AS NEEDED FOR ANXIETY 08/23/18   Mosie Lukes, MD  clopidogrel (PLAVIX) 75 MG tablet Take 1 tablet (75 mg total) by mouth daily. 08/09/18   Mosie Lukes, MD  Dexlansoprazole (DEXILANT) 30 MG capsule Take 1 capsule (30 mg total) by mouth daily. Talk to primary care doctor about continuation of medicine. 07/19/18   Dunn, Nedra Hai, PA-C  ezetimibe (ZETIA) 10 MG tablet TAKE 1 TABLET BY MOUTH ONCE DAILY 08/23/18   Mosie Lukes, MD  FISH OIL-KRILL OIL PO Take 1,000 mg by mouth daily.     [provider]  gabapentin (NEURONTIN) 100 MG capsule TAKE 1 TO 2 CAPSULES BY MOUTH AT BEDTIME Patient taking differently: Take 200 mg by mouth at bedtime.  02/28/18   Mosie Lukes, MD  levothyroxine (SYNTHROID, LEVOTHROID) 125 MCG tablet Take 1 tablet (125 mcg total) by mouth daily before breakfast. 05/22/18   Mosie Lukes, MD    losartan (COZAAR) 25 MG tablet TAKE 1 TABLET BY MOUTH ONCE DAILY Patient taking differently: Take 25 mg by mouth daily.  05/13/18   Mosie Lukes, MD  montelukast (SINGULAIR) 10 MG tablet Take 1 tablet (10 mg total) by mouth at bedtime as needed. Patient taking differently: Take 10 mg by mouth at bedtime.  05/22/18   Mosie Lukes, MD  nitroGLYCERIN (NITROSTAT) 0.4 MG SL tablet Place 1 tablet (0.4 mg total) under the tongue every 5 (five) minutes as needed for chest pain. 07/25/18 10/23/18  Revankar, Reita Cliche, MD  sertraline (ZOLOFT) 50 MG tablet Take 1 tablet (50 mg total) by mouth daily. 07/23/18   Mosie Lukes, MD  vitamin B-12 (CYANOCOBALAMIN) 500 MCG tablet Take 500 mcg by mouth daily.     [provider]    Family History Family History  Problem Relation Age of Onset  . Stroke Mother        mini stroke  . Kidney disease Mother   . Heart failure Mother   . Hypertension Mother   . Diabetes Sister        type 2  . Kidney disease Sister   . Allergic rhinitis Sister   . Heart attack Maternal Grandfather   . Hyperlipidemia Sister     Social History Social History   Tobacco Use  . Smoking status: Former Smoker    Packs/day: 2.00    Years: 30.00    Pack years: 60.00    Types: Cigarettes    Last attempt to quit: 12/03/1994    Years since quitting: 23.7  . Smokeless tobacco: Never Used  Substance Use Topics  . Alcohol use: Yes    Comment: 07/18/2018 "1 drink q 1-2 months; if that"  . Drug use: Never     Allergies   Niaspan [niacin er]; Pantoprazole; Bupropion; Nitroglycerin; Penicillins; Statins; Sulfonamide derivatives; Apple; and Banana   Review of Systems Review of Systems  Respiratory: Positive for shortness of breath.    All other systems reviewed and are negative except that which was mentioned in HPI   Physical Exam Updated Vital Signs BP 133/60   Pulse 66   Temp 98.4 F (36.9 C) (Oral)   Resp 15   Ht 5\' 3"  (1.6 m)   Wt 88.5 kg   SpO2  98%   BMI 34.54 kg/m   Physical Exam Vitals signs and nursing note reviewed.  Constitutional:      General: She is not in acute distress.    Appearance: She is well-developed.  HENT:     Head: Normocephalic and atraumatic.  Eyes:     Conjunctiva/sclera: Conjunctivae normal.  Neck:     Musculoskeletal: Neck supple.  Cardiovascular:     Rate and Rhythm: Normal rate and regular rhythm.     Heart sounds: Normal heart sounds. No murmur.  Pulmonary:     Effort: Pulmonary effort is normal.     Breath sounds: Normal breath sounds. No wheezing.  Abdominal:     General: Bowel sounds are normal. There is no distension.     Palpations: Abdomen is soft.     Tenderness: There is no abdominal tenderness.  Musculoskeletal:     Right lower leg: Edema present.     Left lower leg: Edema present.     Comments: Mild BLE edema  Skin:    General: Skin is warm and dry.  Neurological:     Mental Status: She is alert and oriented to person, place, and time.     Comments: Fluent speech  Psychiatric:        Mood and Affect: Mood is anxious.        Judgment: Judgment normal.     Comments: Resting tremor hands      ED Treatments / Results  Labs (all labs ordered are listed, but only abnormal results are displayed) Labs Reviewed  COMPREHENSIVE METABOLIC PANEL - Abnormal; Notable for the following components:      Result Value   Glucose, Bld 102 (*)    BUN 41 (*)    Creatinine, Ser 2.24 (*)    GFR calc non Af Amer 22 (*)    GFR calc Af Amer 25 (*)    All other components within normal limits  CBC WITH DIFFERENTIAL/PLATELET - Abnormal; Notable for the following components:   RBC 3.49 (*)    Hemoglobin 10.1 (*)    HCT 32.3 (*)    All other components within normal limits  TROPONIN I  BRAIN NATRIURETIC PEPTIDE    EKG EKG Interpretation  Date/Time:  Wednesday September 04 2018 19:46:52 EST Ventricular Rate:  73 PR Interval:  222 QRS Duration: 66 QT Interval:  380 QTC  Calculation: 418 R Axis:   39 Text Interpretation:  Sinus rhythm with 1st degree A-V block Low voltage QRS Borderline ECG No significant change since last tracing Confirmed by Theotis Burrow [phone]) on 09/04/2018 11:13:31 PM   Radiology Dg Chest 2 View  Result Date: 09/04/2018 CLINICAL DATA:  73 y/o  F; shortness of breath. EXAM: CHEST - 2 VIEW COMPARISON:  07/17/2018 chest radiograph FINDINGS: Stable borderline cardiomegaly given projection and technique. Aortic calcific atherosclerosis. Stable calcifications projecting over left breast. No consolidation, effusion, or pneumothorax. No acute osseous abnormality is evident. IMPRESSION: No acute pulmonary process identified. Electronically Signed   By: Kristine Garbe M.D.   On: 09/04/2018 20:36    Procedures Procedures (including critical care time)  Medications Ordered in ED Medications - No data to display   Initial Impression / Assessment and Plan / ED Course  I have reviewed the triage vital signs and the nursing notes.  Pertinent labs & imaging results that were available during my care of the patient were reviewed by me and considered in my medical decision making (see chart for details).    She  was anxious but otherwise well-appearing on exam, reassuring vital signs, normal O2 saturation and clear breath sounds.  No obvious wheezing to suggest COPD exacerbation.  X-ray clear.  Lab work shows creatinine 2.24 consistent with known CKD, hemoglobin 10.1 stable from previous, negative troponin, normal BNP.  EKG similar to previous.  I do not feel that her shortness of breath is explained by chronic anemia and she has no evidence of volume overload/CHF.  Her description of symptoms suggest that symptoms have been present ever since stenting rather than a sudden change recently, making PE or ACS less likely. I did, however, recommend admission for V/Q scan to r/o PE given h/o PE as well as for cardiology evaluation. Pt declined, stating  she did not want to be admitted to the hospital and would rather follow-up in the clinic with her cardiologist.  I have reviewed risks of discharge and she and family voiced understanding.  Final Clinical Impressions(s) / ED Diagnoses   Final diagnoses:  Shortness of breath    ED Discharge Orders    None       , Wenda Overland, MD 09/05/18 [phone]

## 2018-09-04 NOTE — Progress Notes (Signed)
[place] [address] Dept: [phone]  FOLLOW UP NOTE  Patient ID: Emily Mitchell, female    DOB: [birth year]  Age: 73 y.o. MRN: [id number] Date of Office Visit: 09/04/2018  Assessment  Chief Complaint: Food/Drug Challenge  HPI Emily Mitchell is a 73 year old female who presents to the office today for a food challenge to banana.  She reports she is feeling well and has not taken any antihistamines for the last 3 days.  She has not had a reaction to bananas on any previous occasion, however was informed that she had an allergy to banana via blood test.  On 08/29/2018 she had a negative percutaneous skin test to banana in this clinic.  Her current medications are listed in the chart.   Drug Allergies:  Allergies  Allergen Reactions  . Niaspan [Niacin Er] Nausea And Vomiting and Swelling    Swelling in mouth  . Pantoprazole     Mouth sores  . Bupropion Other (See Comments)    Uncontrollable shakes  . Nitroglycerin     NOT allergic - cardiology has recommended she NOT use this due to h/o severe carotid disease as it may decrease cerebral perfusion  . Penicillins Hives    DID THE REACTION INVOLVE: Swelling of the face/tongue/throat, SOB, or low BP? Yes Sudden or severe rash/hives, skin peeling, or the inside of the mouth or nose? Unknown Did it require medical treatment? Unknown When did it last happen?teen  If all above answers are "NO", may proceed with cephalosporin use.  . Statins   . Sulfonamide Derivatives Hives  . Apple Rash  . Banana Rash    Physical Exam: BP (!) 144/66   Pulse 66   Temp 97.9 F (36.6 C) (Oral)   Resp 20   SpO2 98%    Physical Exam Vitals signs reviewed.  Constitutional:      Appearance: Normal appearance.  HENT:     Head: Normocephalic and atraumatic.     Right Ear: Tympanic membrane normal.     Nose:     Comments: Bilateral nares slightly erythematous with no nasal drainage noted.  Pharynx normal.  Ears normal.  Eyes  normal.    Mouth/Throat:     Pharynx: Oropharynx is clear.  Eyes:     Conjunctiva/sclera: Conjunctivae normal.  Neck:     Musculoskeletal: Normal range of motion and neck supple.  Cardiovascular:     Rate and Rhythm: Normal rate and regular rhythm.     Heart sounds: No murmur.  Pulmonary:     Effort: Pulmonary effort is normal.     Breath sounds: Normal breath sounds.     Comments: Lungs clear to auscultation Musculoskeletal: Normal range of motion.  Skin:    General: Skin is warm and dry.  Neurological:     Mental Status: She is alert and oriented to person, place, and time.  Psychiatric:        Mood and Affect: Mood normal.        Behavior: Behavior normal.        Thought Content: Thought content normal.        Judgment: Judgment normal.     Diagnostics: FVC 1.84, FEV1 1.32.  Predicted FVC 2.87, predicted FEV1 2.17.  Spirometry indicates moderate restriction.  This is consistent with previous spirometry readings.  Assessment and Plan: 1. History of food allergy   2. COPD with asthma [facility])      Patient Instructions  Food allergy Emily Mitchell was able  to tolerate the banana food challenge today at the office without adverse signs or symptoms of an allergic reaction. Therefore, she has the same risk of systemic reaction associated with the consumption of banana as the general population.  - Do not eat any banana for the next 24 hours. - Monitor for allergic symptoms such as rash, wheezing, diarrhea, swelling, and vomiting for the next 24 hours. If severe symptoms occur, treat with EpiPen injection and call 911. For less severe symptoms treat with Benadryl 50 mg and call the clinic. - If no allergic symptoms are evident, reintroduce banana into the diet, 1-2 servings a day. If you develop an allergic reaction to banana, record what was eaten the amount eaten, preparation method, time from ingestion to reaction, and symptoms.   Call the clinic if this treatment plan is not  working well for you  Follow up in 6 months or sooner if needed   Return in about 6 months (around 03/05/2019), or if symptoms worsen or fail to improve.   Thank you for the opportunity to care for this patient.  Please do not hesitate to contact me with questions.  Gareth Morgan, FNP [facility]  I have provided oversight concerning Gareth Morgan' evaluation and treatment of this patient's health issues addressed during today's encounter. I agree with the assessment and therapeutic plan as outlined in the note.   Thank you for the opportunity to care for this patient.  Please do not hesitate to contact me with questions.  Penne Lash, M.D.  [facility] [address] [place], [place] [address] [phone]

## 2018-09-05 NOTE — Discharge Instructions (Addendum)
RETURN TO [facility] IF YOUR SHORTNESS OF BREATH WORSENS OR IF YOU START HAVING CHEST PAIN, ABDOMINAL PAIN, OR FEEL LIKE YOU ARE GOING TO PASS OUT.

## 2018-09-05 NOTE — Addendum Note (Signed)
Addended by: Orpah Greek D on: 09/05/2018 11:10 AM   Modules accepted: Orders

## 2018-09-05 NOTE — ED Notes (Signed)
Pt verbalizes understanding of d/c instructions and denies any further needs at this time. 

## 2018-09-06 ENCOUNTER — Ambulatory Visit: Payer: Medicare HMO | Admitting: Cardiology

## 2018-09-06 ENCOUNTER — Encounter: Payer: Self-pay | Admitting: Cardiology

## 2018-09-06 VITALS — BP 122/82 | HR 79 | Ht 63.0 in | Wt 194.0 lb

## 2018-09-06 DIAGNOSIS — I779 Disorder of arteries and arterioles, unspecified: Secondary | ICD-10-CM

## 2018-09-06 DIAGNOSIS — I10 Essential (primary) hypertension: Secondary | ICD-10-CM | POA: Diagnosis not present

## 2018-09-06 DIAGNOSIS — N184 Chronic kidney disease, stage 4 (severe): Secondary | ICD-10-CM | POA: Diagnosis not present

## 2018-09-06 DIAGNOSIS — I739 Peripheral vascular disease, unspecified: Secondary | ICD-10-CM | POA: Diagnosis not present

## 2018-09-06 DIAGNOSIS — I2510 Atherosclerotic heart disease of native coronary artery without angina pectoris: Secondary | ICD-10-CM | POA: Diagnosis not present

## 2018-09-06 DIAGNOSIS — E782 Mixed hyperlipidemia: Secondary | ICD-10-CM | POA: Diagnosis not present

## 2018-09-06 DIAGNOSIS — Z86711 Personal history of pulmonary embolism: Secondary | ICD-10-CM | POA: Diagnosis not present

## 2018-09-06 NOTE — Patient Instructions (Signed)
Medication Instructions:  Your physician recommends that you continue on your current medications as directed. Please refer to the Current Medication list given to you today.  If you need a refill on your cardiac medications before your next appointment, please call your pharmacy.   Lab work: NONE  If you have labs (blood work) drawn today and your tests are completely normal, you will receive your results only by:  [place] (if you have MyChart) OR  A paper copy in the mail If you have any lab test that is abnormal or we need to change your treatment, we will call you to review the results.  Testing/Procedures: NONE  Follow-Up: At [facility], you and your health needs are our priority.  As part of our continuing mission to provide you with exceptional heart care, we have created designated Provider Care Teams.  These Care Teams include your primary Cardiologist (physician) and Advanced Practice Providers (APPs -  Physician Assistants and Nurse Practitioners) who all work together to provide you with the care you need, when you need it. You will need a follow up appointment in 3 months.  Please call our office 2 months in advance to schedule this appointment.  You may see Jenean Lindau, MD

## 2018-09-06 NOTE — Progress Notes (Signed)
Cardiology Office Note:    Date:  09/06/2018   ID:  Emily Mitchell, Emily Mitchell 26-Oct-1945, MRN [id number]  PCP:  Mosie Lukes, MD  Cardiologist:  Jenean Lindau, MD   Referring MD: Mosie Lukes, MD    ASSESSMENT:    1. Coronary artery disease involving native coronary artery of native heart without angina pectoris   2. Carotid artery disease, unspecified laterality, unspecified type ([place])   3. Essential hypertension   4. Hyperlipidemia, mixed   5. CKD (chronic kidney disease), stage IV ([place])   6. History of pulmonary embolism    PLAN:    In order of problems listed above:  1. Secondary prevention stressed with the patient.  Importance of compliance with diet and medication stressed and she vocalized understanding.  Blood pressure is stable.  Diet was discussed for dyslipidemia.  I told her to follow-up with her low hemoglobin with her primary care physician.  Especially in light of the fact that she has significant renal insufficiency. 2. Patient's symptoms are not suggestive of coronary etiology based on clinical findings.  I had an extensive discussion with her.  I discussed with her benefits and risks about testing such as nuclear stress testing.  I also discussed about angiography and dye related renal issues.  She is vehemently against any testing at this time as she is asymptomatic.  Family agrees.  I respect their wishes. 3. Sublingual nitroglycerin prescription was sent, its protocol and 911 protocol explained and the patient vocalized understanding questions were answered to the patient's satisfaction 4. Patient will be seen in follow-up appointment in 3 months or earlier if the patient has any concerns    Medication Adjustments/Labs and Tests Ordered: Current medicines are reviewed at length with the patient today.  Concerns regarding medicines are outlined above.  No orders of the defined types were placed in this encounter.  No orders of the defined types were placed in  this encounter.    No chief complaint on file.    History of Present Illness:    Emily Mitchell is a 73 y.o. female.  Patient has known coronary artery disease and carotid artery disease.  She has essential hypertension and dyslipidemia.  She has been a smoker in the past but quit in the remote past.  She went to the emergency room with having to use her inhaler.  She says she was short of breath but she was treated and released.  She denies any problems at this time and takes care of activities of daily living.  No chest pain orthopnea or PND.  I asked her questions in great depth.  She had some shortness of breath and went to the hospital.  Her d-dimer was negative.  Before her coronary stenting in December she had chest tightness on exertion.  She clearly states she has none of those symptoms.  At the time of my evaluation, the patient is alert awake oriented and in no distress.  She has a very supportive family who accompanies her for this visit.  Past Medical History:  Diagnosis Date  . Anemia 07/05/2014  . Anxiety and depression 12/14/2008   Qualifier: Diagnosis of  By: [name] LPN, Almyra Free    . Arthritis    "fingers, toes, knees, joints" (07/18/2018)  . Asthma   . Atypical chest pain 05/23/2015  . CAD (coronary artery disease)    a. [place] with cath 06/2018 s/p DES to LAD.  Marland Kitchen Carotid artery occlusion    a.  R carotid occluded, 16-10% LICA.  Marland Kitchen CHF (congestive heart failure) ([place])   . Chicken pox as a child  . Chronic kidney disease    Bright's Disease at age 37   . COPD (chronic obstructive pulmonary disease) ([place])   . GERD (gastroesophageal reflux disease)   . H. pylori infection 10/19/2013  . Hepatitis 1970s   "don't know for sure which one it was; think it was A" (07/18/2018)  . Hyperglycemia 03/14/2016  . Hyperlipidemia   . Hypertension   . Hypothyroidism   . Knee pain, bilateral 12/17/2012  . Left hip pain 05/23/2015  . Low back pain 09/19/2015  . Macular degeneration of both  eyes 12/16/2015   Right eye is wet Left Eye is dry Shots every 11 to 12 weeks in right eye at opthamologist office  . Medicare annual wellness visit, subsequent 02/21/2015  . Mumps 73 yrs old  . Pneumonia    "at least once" (07/18/2018)  . Preventative health care 03/14/2016  . Pulmonary emboli ([place]) 05/26/2013  . Sleep apnea    "don't use CPAP anymore; dr said I didn't have to" (07/18/2018)  . Stroke [facility])    "been told I've had some strokes; didn't know I'd had them" (07/18/2018)  . Tremor   . UTI (lower urinary tract infection) 10/19/2013    Past Surgical History:  Procedure Laterality Date  . APPENDECTOMY  1984  . CARDIAC CATHETERIZATION  ~ 2006  . CAROTID ANGIOGRAPHY Left 08/20/2018   Procedure: CAROTID ANGIOGRAPHY;  Surgeon: Serafina Mitchell, MD;  Location: [facility];  Service: Cardiovascular;  Laterality: Left;  . CAROTID ENDARTERECTOMY Right 05/10/07   cea  . CATARACT EXTRACTION Left   . CATARACT EXTRACTION W/ INTRAOCULAR LENS  IMPLANT, BILATERAL Bilateral   . CORONARY ANGIOPLASTY WITH STENT PLACEMENT  07/18/2018  . CORONARY STENT INTERVENTION N/A 07/18/2018   Procedure: CORONARY STENT INTERVENTION;  Surgeon: Lorretta Harp, MD;  Location: [facility];  Service: Cardiovascular;  Laterality: N/A;  . INTRAVASCULAR PRESSURE WIRE/FFR STUDY N/A 07/18/2018   Procedure: INTRAVASCULAR PRESSURE WIRE/FFR STUDY;  Surgeon: Lorretta Harp, MD;  Location: [facility];  Service: Cardiovascular;  Laterality: N/A;  . JOINT REPLACEMENT    . LEFT HEART CATH AND CORONARY ANGIOGRAPHY N/A 07/18/2018   Procedure: LEFT HEART CATH AND CORONARY ANGIOGRAPHY;  Surgeon: Lorretta Harp, MD;  Location: [facility];  Service: Cardiovascular;  Laterality: N/A;  . MULTIPLE TOOTH EXTRACTIONS    . TOTAL KNEE ARTHROPLASTY Bilateral 2005 - 2011   right - left  . VAGINAL HYSTERECTOMY  1984  . WISDOM TOOTH EXTRACTION      Current Medications: Current Meds  Medication Sig    . acetaminophen (TYLENOL) 500 MG tablet Take 500 mg by mouth every 6 (six) hours as needed for moderate pain.   Marland Kitchen albuterol (PROVENTIL HFA;VENTOLIN HFA) 108 (90 Base) MCG/ACT inhaler Inhale 2 puffs into the lungs every 6 (six) hours as needed for wheezing or shortness of breath.  Marland Kitchen aspirin EC 81 MG tablet Take 1 tablet (81 mg total) by mouth daily.  . budesonide-formoterol (SYMBICORT) 80-4.5 MCG/ACT inhaler Inhale 2 puffs into the lungs 2 (two) times daily.  Marland Kitchen CALCIUM CITRATE-VITAMIN D PO Take 1 tablet by mouth daily.   . cetirizine (ZYRTEC) 10 MG tablet Take 1 tablet (10 mg total) by mouth daily as needed for allergies.  . Cholecalciferol (D3 MAXIMUM STRENGTH) 5000 UNITS capsule Take 5,000 Units by mouth daily.  . Choline  Fenofibrate (FENOFIBRIC ACID) 135 MG CPDR TAKE 1 CAPSULE BY MOUTH ONCE DAILY (Patient taking differently: Take 135 mg by mouth daily. )  . clonazePAM (KLONOPIN) 1 MG tablet TAKE 1 TABLET BY MOUTH THREE TIMES DAILY AS NEEDED FOR ANXIETY  . clopidogrel (PLAVIX) 75 MG tablet Take 1 tablet (75 mg total) by mouth daily.  Marland Kitchen Dexlansoprazole (DEXILANT) 30 MG capsule Take 1 capsule (30 mg total) by mouth daily. Talk to primary care doctor about continuation of medicine.  . ezetimibe (ZETIA) 10 MG tablet TAKE 1 TABLET BY MOUTH ONCE DAILY  . FISH OIL-KRILL OIL PO Take 1,000 mg by mouth daily.   Marland Kitchen gabapentin (NEURONTIN) 100 MG capsule TAKE 1 TO 2 CAPSULES BY MOUTH AT BEDTIME (Patient taking differently: Take 200 mg by mouth at bedtime. )  . levothyroxine (SYNTHROID, LEVOTHROID) 125 MCG tablet Take 1 tablet (125 mcg total) by mouth daily before breakfast.  . losartan (COZAAR) 25 MG tablet TAKE 1 TABLET BY MOUTH ONCE DAILY (Patient taking differently: Take 25 mg by mouth daily. )  . montelukast (SINGULAIR) 10 MG tablet Take 1 tablet (10 mg total) by mouth at bedtime as needed. (Patient taking differently: Take 10 mg by mouth at bedtime. )  . nitroGLYCERIN (NITROSTAT) 0.4 MG SL tablet Place 1  tablet (0.4 mg total) under the tongue every 5 (five) minutes as needed for chest pain.  Marland Kitchen sertraline (ZOLOFT) 50 MG tablet Take 1 tablet (50 mg total) by mouth daily.  . vitamin B-12 (CYANOCOBALAMIN) 500 MCG tablet Take 500 mcg by mouth daily.      Allergies:   Niaspan [niacin er]; Pantoprazole; Bupropion; Nitroglycerin; Penicillins; Statins; Sulfonamide derivatives; Apple; and Banana   Social History   Socioeconomic History  . Marital status: Divorced    Spouse name: Not on file  . Number of children: 2  . Years of education: Not on file  . Highest education level: Not on file  Occupational History  . Not on file  Social Needs  . Financial resource strain: Not on file  . Food insecurity:    Worry: Not on file    Inability: Not on file  . Transportation needs:    Medical: Not on file    Non-medical: Not on file  Tobacco Use  . Smoking status: Former Smoker    Packs/day: 2.00    Years: 30.00    Pack years: 60.00    Types: Cigarettes    Last attempt to quit: 12/03/1994    Years since quitting: 23.7  . Smokeless tobacco: Never Used  Substance and Sexual Activity  . Alcohol use: Yes    Comment: 07/18/2018 "1 drink q 1-2 months; if that"  . Drug use: Never  . Sexual activity: Not Currently    Comment: lives with son and friend, no dietary restrictions  Lifestyle  . Physical activity:    Days per week: Not on file    Minutes per session: Not on file  . Stress: Not on file  Relationships  . Social connections:    Talks on phone: Not on file    Gets together: Not on file    Attends religious service: Not on file    Active member of club or organization: Not on file    Attends meetings of clubs or organizations: Not on file    Relationship status: Not on file  Other Topics Concern  . Not on file  Social History Narrative   epworth sleepiness scale = 11 (06/15/2015)  Family History: The patient's family history includes Allergic rhinitis in her sister; Diabetes in  her sister; Heart attack in her maternal grandfather; Heart failure in her mother; Hyperlipidemia in her sister; Hypertension in her mother; Kidney disease in her mother and sister; Stroke in her mother.  ROS:   Please see the history of present illness.    All other systems reviewed and are negative.  EKGs/Labs/Other Studies Reviewed:    The following studies were reviewed today: I reviewed emergency records extensively.   Recent Labs: 07/08/2018: TSH 0.84 09/04/2018: ALT 15; B Natriuretic Peptide 38.9; BUN 41; Creatinine, Ser 2.24; Hemoglobin 10.1; Platelets 283; Potassium 4.3; Sodium 136  Recent Lipid Panel    Component Value Date/Time   CHOL 172 07/08/2018 1511   TRIG 118.0 07/08/2018 1511   HDL 60.30 07/08/2018 1511   CHOLHDL 3 07/08/2018 1511   VLDL 23.6 07/08/2018 1511   LDLCALC 88 07/08/2018 1511   LDLDIRECT 129.0 02/28/2018 1458    Physical Exam:    VS:  BP 122/82 (BP Location: Right Arm, Patient Position: Sitting, Cuff Size: Normal)   Pulse 79   Ht 5\' 3"  (1.6 m)   Wt 194 lb (88 kg)   SpO2 93%   BMI 34.37 kg/m     Wt Readings from Last 3 Encounters:  09/06/18 194 lb (88 kg)  09/04/18 195 lb (88.5 kg)  08/20/18 195 lb (88.5 kg)     GEN: Patient is in no acute distress HEENT: Normal NECK: No JVD; No carotid bruits LYMPHATICS: No lymphadenopathy CARDIAC: Hear sounds regular, 2/6 systolic murmur at the apex. RESPIRATORY:  Clear to auscultation without rales, wheezing or rhonchi  ABDOMEN: Soft, non-tender, non-distended MUSCULOSKELETAL:  No edema; No deformity  SKIN: Warm and dry NEUROLOGIC:  Alert and oriented x 3 PSYCHIATRIC:  Normal affect   Signed, Jenean Lindau, MD  09/06/2018 9:23 AM    [place]

## 2018-09-11 ENCOUNTER — Other Ambulatory Visit: Payer: Self-pay | Admitting: Family Medicine

## 2018-09-16 ENCOUNTER — Other Ambulatory Visit (HOSPITAL_COMMUNITY): Payer: Self-pay

## 2018-09-17 ENCOUNTER — Inpatient Hospital Stay (HOSPITAL_COMMUNITY): Admission: RE | Admit: 2018-09-17 | Payer: Medicare HMO | Source: Ambulatory Visit

## 2018-09-19 ENCOUNTER — Ambulatory Visit (HOSPITAL_COMMUNITY)
Admission: RE | Admit: 2018-09-19 | Discharge: 2018-09-19 | Disposition: A | Discharge status: Home / Self Care | Payer: Medicare HMO | Source: Ambulatory Visit | Attending: Nephrology | Admitting: Nephrology

## 2018-09-19 DIAGNOSIS — N189 Chronic kidney disease, unspecified: Secondary | ICD-10-CM | POA: Diagnosis not present

## 2018-09-19 DIAGNOSIS — D631 Anemia in chronic kidney disease: Secondary | ICD-10-CM | POA: Diagnosis not present

## 2018-09-19 MED ORDER — SODIUM CHLORIDE 0.9 % IV SOLN
510.0000 mg | INTRAVENOUS | Status: DC
  Administered 2018-09-19: 510 mg via INTRAVENOUS
  Filled 2018-09-19: qty 510

## 2018-09-24 ENCOUNTER — Encounter (HOSPITAL_COMMUNITY): Payer: Medicare HMO

## 2018-09-24 DIAGNOSIS — H353121 Nonexudative age-related macular degeneration, left eye, early dry stage: Secondary | ICD-10-CM | POA: Diagnosis not present

## 2018-09-24 DIAGNOSIS — H35363 Drusen (degenerative) of macula, bilateral: Secondary | ICD-10-CM | POA: Diagnosis not present

## 2018-09-24 DIAGNOSIS — H353211 Exudative age-related macular degeneration, right eye, with active choroidal neovascularization: Secondary | ICD-10-CM | POA: Diagnosis not present

## 2018-09-24 DIAGNOSIS — H3554 Dystrophies primarily involving the retinal pigment epithelium: Secondary | ICD-10-CM | POA: Diagnosis not present

## 2018-09-26 ENCOUNTER — Ambulatory Visit (HOSPITAL_COMMUNITY)
Admission: RE | Admit: 2018-09-26 | Discharge: 2018-09-26 | Disposition: A | Discharge status: Home / Self Care | Payer: Medicare HMO | Source: Ambulatory Visit | Attending: Nephrology | Admitting: Nephrology

## 2018-09-26 DIAGNOSIS — D631 Anemia in chronic kidney disease: Secondary | ICD-10-CM | POA: Diagnosis present

## 2018-09-26 MED ORDER — SODIUM CHLORIDE 0.9 % IV SOLN
510.0000 mg | INTRAVENOUS | Status: DC
  Administered 2018-09-26: 510 mg via INTRAVENOUS
  Filled 2018-09-26: qty 510

## 2018-10-21 ENCOUNTER — Telehealth: Payer: Self-pay | Admitting: Cardiology

## 2018-10-21 ENCOUNTER — Telehealth: Payer: Self-pay | Admitting: Pulmonary Disease

## 2018-10-21 NOTE — Telephone Encounter (Signed)
° °  Primary Cardiologist:  Jenean Lindau, MD   Patient contacted.  History reviewed.  No symptoms to suggest any unstable cardiac conditions.  Based on discussion, with current pandemic situation, we will be postponing this appointment for Emily Mitchell.  If symptoms change, she has been instructed to contact our office.     Calla Kicks  10/21/2018 1:13 PM         .

## 2018-10-21 NOTE — Telephone Encounter (Signed)
Attempted to contact patient x 4 but phone line keeps ringing busy. Will leave in triage and attempt to call patient at later time.

## 2018-10-22 ENCOUNTER — Ambulatory Visit: Payer: Medicare HMO | Admitting: Cardiology

## 2018-10-22 DIAGNOSIS — N183 Chronic kidney disease, stage 3 (moderate): Secondary | ICD-10-CM | POA: Diagnosis not present

## 2018-10-22 DIAGNOSIS — N189 Chronic kidney disease, unspecified: Secondary | ICD-10-CM | POA: Diagnosis not present

## 2018-10-24 NOTE — Telephone Encounter (Signed)
ATC pt, line rang busy x2. Will try back. 

## 2018-10-25 NOTE — Telephone Encounter (Signed)
Received a fax from [place] stating that they have given the patient a temporary supply of Symbicort 80. The drug is either not on the list of covered medications or it is included on the formulary but subject to certain limits.   ATC patient to see if she wants to remain on the Symbicort and have [place] file an appeal or if she is ok with switching to a covered medication.

## 2018-10-28 NOTE — Telephone Encounter (Signed)
Letter printed. Place upfront for out going mail.

## 2018-10-29 NOTE — Telephone Encounter (Signed)
Noted.  Will close encounter.  

## 2018-10-31 ENCOUNTER — Telehealth: Payer: Self-pay | Admitting: Pulmonary Disease

## 2018-10-31 ENCOUNTER — Ambulatory Visit: Payer: Medicare HMO | Admitting: Gastroenterology

## 2018-10-31 NOTE — Telephone Encounter (Signed)
Called patient x2 phone rang busy.

## 2018-11-01 NOTE — Telephone Encounter (Signed)
Patient is returning phone call.  Patient phone number is [phone].

## 2018-11-01 NOTE — Telephone Encounter (Signed)
I attempted to call pt X 3 but there was no answer, just a busy signal. I was unable to leave voicemail. Will try again   Valerie Salts, CMA      10:29 AM  Note    Received a fax from [place] stating that they have given the patient a temporary supply of Symbicort 80. The drug is either not on the list of covered medications or it is included on the formulary but subject to certain limits.   ATC patient to see if she wants to remain on the Symbicort and have [place] file an appeal or if she is ok with switching to a covered medication.

## 2018-11-01 NOTE — Telephone Encounter (Signed)
Called phone rang busy.

## 2018-11-04 NOTE — Telephone Encounter (Signed)
Pt is calling back [phone]

## 2018-11-04 NOTE — Telephone Encounter (Signed)
LVM for patient to return call back today.X1

## 2018-11-04 NOTE — Telephone Encounter (Signed)
lmom 

## 2018-11-05 ENCOUNTER — Other Ambulatory Visit: Payer: Self-pay | Admitting: Family Medicine

## 2018-11-05 NOTE — Telephone Encounter (Signed)
Pt is calling back [phone]

## 2018-11-05 NOTE — Telephone Encounter (Signed)
I called pt but the line was busy X 3, unable to leave message

## 2018-11-05 NOTE — Telephone Encounter (Signed)
I called pt but the line was busy X 3, unable to leave message.

## 2018-11-05 NOTE — Telephone Encounter (Signed)
Copied from [place] [phone]. Topic: Quick Communication - Rx Refill/Question >> Nov 05, 2018 12:38 PM Waylan Rocher, Lumin L wrote: Medication: clonazePAM (KLONOPIN) 1 MG tablet, losartan (COZAAR) 25 MG tablet, sertraline (ZOLOFT) 50 MG tablet (90 day supply for zoloft)     Has the patient contacted their pharmacy? {yes  (Agent: If no, request that the patient contact the pharmacy for the refill.) (Agent: If yes, when and what did the pharmacy advise?)  Preferred Pharmacy (with phone number or street name): [facility], [place] - [address] [place] [address] Phone: [phone] Fax: [phone]  Agent: Please be advised that RX refills may take up to 3 business days. We ask that you follow-up with your pharmacy.

## 2018-11-06 MED ORDER — LOSARTAN POTASSIUM 25 MG PO TABS: 25.0000 mg | ORAL_TABLET | Freq: Every day | ORAL | 1 refills | Status: DC

## 2018-11-06 MED ORDER — SERTRALINE HCL 50 MG PO TABS: 50.0000 mg | ORAL_TABLET | Freq: Every day | ORAL | 3 refills | Status: DC

## 2018-11-06 MED ORDER — CLONAZEPAM 1 MG PO TABS: 1.0000 mg | ORAL_TABLET | Freq: Three times a day (TID) | ORAL | 0 refills | Status: DC | PRN

## 2018-11-06 NOTE — Telephone Encounter (Signed)
Please see encounter email 11/05/18 with new details on this encounter. Will close this encounter.

## 2018-11-06 NOTE — Telephone Encounter (Signed)
Requesting: klonopin Contract:no UDS: n/a Last OV:07/09/19 Next OV:11/12/18 Last Refill:08/23/18  #[id number] Database:   Please advise

## 2018-11-07 ENCOUNTER — Telehealth: Payer: Self-pay | Admitting: Pulmonary Disease

## 2018-11-07 NOTE — Telephone Encounter (Signed)
Patient sent a MyChart message stating that her Symbicort 80 was no longer being covered by her insurance. Spoke with a rep at [employer] and it appears that [place] had tried to process the claim as generic and would need to process the claim as brand name only. Per Aldona Bar at [place], [place] would be $8.   [employer] and asked that they run the brand name through, it went through. Price was $8.11. Sent patient a MyChart letting her know.

## 2018-11-12 ENCOUNTER — Ambulatory Visit: Payer: Medicare HMO | Admitting: Family Medicine

## 2018-11-25 ENCOUNTER — Other Ambulatory Visit: Payer: Self-pay | Admitting: Family Medicine

## 2018-11-25 DIAGNOSIS — E785 Hyperlipidemia, unspecified: Secondary | ICD-10-CM

## 2018-11-25 NOTE — Telephone Encounter (Signed)
Copied from [place] [phone]. Topic: Quick Communication - Rx Refill/Question >> Nov 25, 2018  4:03 PM Nils Flack, Marland Kitchen wrote: Medication: ezetimibe (ZETIA) 10 MG tablet  Has the patient contacted their pharmacy? No. (Agent: If no, request that the patient contact the pharmacy for the refill.) (Agent: If yes, when and what did the pharmacy advise?)  Preferred Pharmacy (with phone number or street name): [facility] [address]  Changing pharmacy   Agent: Please be advised that RX refills may take up to 3 business days. We ask that you follow-up with your pharmacy.

## 2018-11-26 MED ORDER — EZETIMIBE 10 MG PO TABS: 10.0000 mg | ORAL_TABLET | Freq: Every day | ORAL | 1 refills | Status: DC

## 2018-11-26 NOTE — Telephone Encounter (Signed)
Medication sent to pharmacy  

## 2018-12-11 ENCOUNTER — Other Ambulatory Visit: Payer: Self-pay | Admitting: Family Medicine

## 2018-12-11 MED ORDER — LEVOTHYROXINE SODIUM 125 MCG PO TABS: ORAL_TABLET | ORAL | 1 refills | Status: DC

## 2018-12-11 MED ORDER — FENOFIBRIC ACID 135 MG PO CPDR: 1.0000 | DELAYED_RELEASE_CAPSULE | Freq: Every day | ORAL | 1 refills | Status: DC

## 2018-12-11 MED ORDER — GABAPENTIN 100 MG PO CAPS: 200.0000 mg | ORAL_CAPSULE | Freq: Every day | ORAL | 1 refills | Status: DC

## 2018-12-11 NOTE — Telephone Encounter (Signed)
Copied from [place] [phone]. Topic: Quick Communication - Rx Refill/Question >> Dec 11, 2018  3:24 PM Margot Ables wrote: Medication: [place] Fenofibrate (FENOFIBRIC ACID) 135 MG CPDR - 4-5 pills left gabapentin (NEURONTIN) 100 MG capsule - 5 days left levothyroxine (SYNTHROID, LEVOTHROID) 125 MCG tablet - 5 days left Last OV 06/2018 Pt has OV scheduled 12/2018  Has the patient contacted their pharmacy? Yes - no refills to transfer to new pharmacy  Preferred Pharmacy (with phone number or street name): [facility], [place] - [address] [phone] (Phone) [phone] (Fax)

## 2018-12-11 NOTE — Telephone Encounter (Signed)
Refills sent

## 2018-12-12 ENCOUNTER — Encounter

## 2018-12-12 ENCOUNTER — Ambulatory Visit: Payer: Medicare HMO | Admitting: Neurology

## 2018-12-16 DIAGNOSIS — N183 Chronic kidney disease, stage 3 (moderate): Secondary | ICD-10-CM | POA: Diagnosis not present

## 2018-12-16 DIAGNOSIS — N189 Chronic kidney disease, unspecified: Secondary | ICD-10-CM | POA: Diagnosis not present

## 2019-01-06 ENCOUNTER — Telehealth: Payer: Self-pay | Admitting: Pulmonary Disease

## 2019-01-06 DIAGNOSIS — J449 Chronic obstructive pulmonary disease, unspecified: Secondary | ICD-10-CM

## 2019-01-06 MED ORDER — BUDESONIDE-FORMOTEROL FUMARATE 80-4.5 MCG/ACT IN AERO: 2.0000 | INHALATION_SPRAY | Freq: Two times a day (BID) | RESPIRATORY_TRACT | 5 refills | Status: DC

## 2019-01-06 NOTE — Telephone Encounter (Signed)
Refill for Symbicort has been sent to pt's preferred pharmacy. Called and spoke with pt letting her know this had been done and pt verbalized understanding. Nothing further needed.

## 2019-01-07 DIAGNOSIS — Z961 Presence of intraocular lens: Secondary | ICD-10-CM | POA: Diagnosis not present

## 2019-01-07 DIAGNOSIS — H35363 Drusen (degenerative) of macula, bilateral: Secondary | ICD-10-CM | POA: Diagnosis not present

## 2019-01-07 DIAGNOSIS — H3554 Dystrophies primarily involving the retinal pigment epithelium: Secondary | ICD-10-CM | POA: Diagnosis not present

## 2019-01-07 DIAGNOSIS — H04123 Dry eye syndrome of bilateral lacrimal glands: Secondary | ICD-10-CM | POA: Diagnosis not present

## 2019-01-07 DIAGNOSIS — H353121 Nonexudative age-related macular degeneration, left eye, early dry stage: Secondary | ICD-10-CM | POA: Diagnosis not present

## 2019-01-07 DIAGNOSIS — H43823 Vitreomacular adhesion, bilateral: Secondary | ICD-10-CM

## 2019-01-07 DIAGNOSIS — H353211 Exudative age-related macular degeneration, right eye, with active choroidal neovascularization: Secondary | ICD-10-CM | POA: Diagnosis not present

## 2019-01-07 HISTORY — DX: Vitreomacular adhesion, bilateral: H43.823

## 2019-01-14 ENCOUNTER — Ambulatory Visit: Payer: Medicare HMO | Admitting: Adult Health

## 2019-01-16 ENCOUNTER — Ambulatory Visit: Payer: Medicare HMO | Admitting: Family Medicine

## 2019-01-30 ENCOUNTER — Other Ambulatory Visit: Payer: Self-pay

## 2019-01-30 ENCOUNTER — Ambulatory Visit (INDEPENDENT_AMBULATORY_CARE_PROVIDER_SITE_OTHER): Payer: Medicare HMO | Admitting: Family Medicine

## 2019-01-30 VITALS — BP 139/76 | Wt 186.0 lb

## 2019-01-30 DIAGNOSIS — R739 Hyperglycemia, unspecified: Secondary | ICD-10-CM | POA: Diagnosis not present

## 2019-01-30 DIAGNOSIS — N184 Chronic kidney disease, stage 4 (severe): Secondary | ICD-10-CM | POA: Diagnosis not present

## 2019-01-30 DIAGNOSIS — M549 Dorsalgia, unspecified: Secondary | ICD-10-CM | POA: Diagnosis not present

## 2019-01-30 DIAGNOSIS — E782 Mixed hyperlipidemia: Secondary | ICD-10-CM

## 2019-01-30 DIAGNOSIS — K5900 Constipation, unspecified: Secondary | ICD-10-CM | POA: Diagnosis not present

## 2019-01-30 DIAGNOSIS — E559 Vitamin D deficiency, unspecified: Secondary | ICD-10-CM

## 2019-01-30 DIAGNOSIS — I10 Essential (primary) hypertension: Secondary | ICD-10-CM | POA: Diagnosis not present

## 2019-01-30 DIAGNOSIS — D649 Anemia, unspecified: Secondary | ICD-10-CM | POA: Diagnosis not present

## 2019-01-30 MED ORDER — SERTRALINE HCL 50 MG PO TABS: 50.0000 mg | ORAL_TABLET | Freq: Every day | ORAL | 1 refills | Status: DC

## 2019-01-30 MED ORDER — LOSARTAN POTASSIUM 25 MG PO TABS: 25.0000 mg | ORAL_TABLET | Freq: Every day | ORAL | 1 refills | Status: DC

## 2019-01-30 MED ORDER — CLONAZEPAM 1 MG PO TABS: 1.0000 mg | ORAL_TABLET | Freq: Three times a day (TID) | ORAL | 1 refills | Status: DC | PRN

## 2019-01-30 NOTE — Patient Instructions (Addendum)
Encouraged increased hydration and fiber in diet. Daily probiotics. If bowels not moving can use MOM 2 tbls by mouth in 4 oz of warm prune juice by mouth every 2-3 days. If no results then repeat in 4 hours with  Dulcolax suppository pr, may repeat again in 4 more hours as needed. Seek care if symptoms worsen. Consider daily Miralax and/or Dulcolax if symptoms persist.   Miralax and Benefiber together once or twice  Get a pulse oximeter and check vitals weekly

## 2019-02-02 DIAGNOSIS — M549 Dorsalgia, unspecified: Secondary | ICD-10-CM

## 2019-02-02 HISTORY — DX: Dorsalgia, unspecified: M54.9

## 2019-02-02 NOTE — Assessment & Plan Note (Signed)
Encouraged increased hydration and fiber in diet. Daily probiotics. If bowels not moving can use MOM 2 tbls po in 4 oz of warm prune juice by mouth every 2-3 days. If no results then repeat in 4 hours with  Dulcolax suppository pr, may repeat again in 4 more hours as needed. Seek care if symptoms worsen. Consider daily Miralax and/or Dulcolax if symptoms persist. MIralax and benefiber together once or twice a day

## 2019-02-02 NOTE — Progress Notes (Signed)
Virtual Visit via phone Note  I connected with Emily Mitchell on 01/30/19 at  2:40 PM EDT by a phone enabled telemedicine application and verified that I am speaking with the correct person using two identifiers.  Location: Patient: home Provider: office   I discussed the limitations of evaluation and management by telemedicine and the availability of in person appointments. The patient expressed understanding and agreed to proceed. [name] CMA was able to get patient set up on phone visit after patient unable to complete video visit.     Subjective:    Patient ID: Emily Mitchell, female    DOB: [birth year], 73 y.o.   MRN: [id number]  No chief complaint on file.   HPI Patient is in today for follow up on chronic medical concerns including hypertension, hypothyroidism, hyperlipidemia and more. She is noting some trouble with constipation recently. Is only moving her bowels every couple of days they are small and straining is necessary to pass the stool. No bloody or tarry stool. Has tried Senna with marginal results. Notes some increase in low back pain but no radicular symptoms or incontinence. No falls or trauma. Denies CP/palp/SOB/HA/congestion/fevers or GU c/o. Taking meds as prescribed  Past Medical History:  Diagnosis Date  . Anemia 07/05/2014  . Anxiety and depression 12/14/2008   Qualifier: Diagnosis of  By: [name] LPN, Almyra Free    . Arthritis    "fingers, toes, knees, joints" (07/18/2018)  . Asthma   . Atypical chest pain 05/23/2015  . CAD (coronary artery disease)    a. [place] with cath 06/2018 s/p DES to LAD.  Marland Kitchen Carotid artery occlusion    a. R carotid occluded, 30-09% LICA.  Marland Kitchen CHF (congestive heart failure) ([place])   . Chicken pox as a child  . Chronic kidney disease    Bright's Disease at age 44   . COPD (chronic obstructive pulmonary disease) ([place])   . GERD (gastroesophageal reflux disease)   . H. pylori infection 10/19/2013  . Hepatitis 1970s   "don't know for sure  which one it was; think it was A" (07/18/2018)  . Hyperglycemia 03/14/2016  . Hyperlipidemia   . Hypertension   . Hypothyroidism   . Knee pain, bilateral 12/17/2012  . Left hip pain 05/23/2015  . Low back pain 09/19/2015  . Macular degeneration of both eyes 12/16/2015   Right eye is wet Left Eye is dry Shots every 11 to 12 weeks in right eye at opthamologist office  . Medicare annual wellness visit, subsequent 02/21/2015  . Mumps 73 yrs old  . Pneumonia    "at least once" (07/18/2018)  . Preventative health care 03/14/2016  . Pulmonary emboli ([place]) 05/26/2013  . Sleep apnea    "don't use CPAP anymore; dr said I didn't have to" (07/18/2018)  . Stroke [facility])    "been told I've had some strokes; didn't know I'd had them" (07/18/2018)  . Tremor   . UTI (lower urinary tract infection) 10/19/2013    Past Surgical History:  Procedure Laterality Date  . APPENDECTOMY  1984  . CARDIAC CATHETERIZATION  ~ 2006  . CAROTID ANGIOGRAPHY Left 08/20/2018   Procedure: CAROTID ANGIOGRAPHY;  Surgeon: Serafina Mitchell, MD;  Location: [place] [facility];  Service: Cardiovascular;  Laterality: Left;  . CAROTID ENDARTERECTOMY Right 05/10/07   cea  . CATARACT EXTRACTION Left   . CATARACT EXTRACTION W/ INTRAOCULAR LENS  IMPLANT, BILATERAL Bilateral   . CORONARY ANGIOPLASTY WITH STENT PLACEMENT  07/18/2018  . CORONARY STENT  INTERVENTION N/A 07/18/2018   Procedure: CORONARY STENT INTERVENTION;  Surgeon: Berry, Jonathan J, MD;  Location: [facility];  Service: Cardiovascular;  Laterality: N/A;  . INTRAVASCULAR PRESSURE WIRE/FFR STUDY N/A 07/18/2018   Procedure: INTRAVASCULAR PRESSURE WIRE/FFR STUDY;  Surgeon: Berry, Jonathan J, MD;  Location: [facility];  Service: Cardiovascular;  Laterality: N/A;  . JOINT REPLACEMENT    . LEFT HEART CATH AND CORONARY ANGIOGRAPHY N/A 07/18/2018   Procedure: LEFT HEART CATH AND CORONARY ANGIOGRAPHY;  Surgeon: Berry, Jonathan J, MD;  Location: [facility];   Service: Cardiovascular;  Laterality: N/A;  . MULTIPLE TOOTH EXTRACTIONS    . TOTAL KNEE ARTHROPLASTY Bilateral 2005 - 2011   right - left  . VAGINAL HYSTERECTOMY  1984  . WISDOM TOOTH EXTRACTION      Family History  Problem Relation Age of Onset  . Stroke Mother        mini stroke  . Kidney disease Mother   . Heart failure Mother   . Hypertension Mother   . Diabetes Sister        type 2  . Kidney disease Sister   . Allergic rhinitis Sister   . Heart attack Maternal Grandfather   . Hyperlipidemia Sister     Social History   Socioeconomic History  . Marital status: Divorced    Spouse name: Not on file  . Number of children: 2  . Years of education: Not on file  . Highest education level: Not on file  Occupational History  . Not on file  Social Needs  . Financial resource strain: Not on file  . Food insecurity    Worry: Not on file    Inability: Not on file  . Transportation needs    Medical: Not on file    Non-medical: Not on file  Tobacco Use  . Smoking status: Former Smoker    Packs/day: 2.00    Years: 30.00    Pack years: 60.00    Types: Cigarettes    Quit date: 12/03/1994    Years since quitting: 24.1  . Smokeless tobacco: Never Used  Substance and Sexual Activity  . Alcohol use: Yes    Comment: 07/18/2018 "1 drink q 1-2 months; if that"  . Drug use: Never  . Sexual activity: Not Currently    Comment: lives with son and friend, no dietary restrictions  Lifestyle  . Physical activity    Days per week: Not on file    Minutes per session: Not on file  . Stress: Not on file  Relationships  . Social connections    Talks on phone: Not on file    Gets together: Not on file    Attends religious service: Not on file    Active member of club or organization: Not on file    Attends meetings of clubs or organizations: Not on file    Relationship status: Not on file  . Intimate partner violence    Fear of current or ex partner: Not on file    Emotionally  abused: Not on file    Physically abused: Not on file    Forced sexual activity: Not on file  Other Topics Concern  . Not on file  Social History Narrative   epworth sleepiness scale = 11 (06/15/2015)    Outpatient Medications Prior to Visit  Medication Sig Dispense Refill  . acetaminophen (TYLENOL) 500 MG tablet Take 500 mg by mouth every 6 (six) hours as needed for moderate   pain.     . albuterol (PROVENTIL HFA;VENTOLIN HFA) 108 (90 Base) MCG/ACT inhaler Inhale 2 puffs into the lungs every 6 (six) hours as needed for wheezing or shortness of breath.    Marland Kitchen aspirin EC 81 MG tablet Take 1 tablet (81 mg total) by mouth daily. 90 tablet 3  . budesonide-formoterol (SYMBICORT) 80-4.5 MCG/ACT inhaler Inhale 2 puffs into the lungs 2 (two) times daily. 1 Inhaler 5  . CALCIUM CITRATE-VITAMIN D PO Take 1 tablet by mouth daily.     . cetirizine (ZYRTEC) 10 MG tablet Take 1 tablet (10 mg total) by mouth daily as needed for allergies. 30 tablet 11  . Cholecalciferol (D3 MAXIMUM STRENGTH) 5000 UNITS capsule Take 5,000 Units by mouth daily.    . Choline Fenofibrate (FENOFIBRIC ACID) 135 MG CPDR Take 1 capsule by mouth daily. 90 capsule 1  . clopidogrel (PLAVIX) 75 MG tablet Take 1 tablet (75 mg total) by mouth daily. 90 tablet 6  . Dexlansoprazole (DEXILANT) 30 MG capsule Take 1 capsule (30 mg total) by mouth daily. Talk to primary care doctor about continuation of medicine. 30 capsule 1  . ezetimibe (ZETIA) 10 MG tablet Take 1 tablet (10 mg total) by mouth daily. 90 tablet 1  . FISH OIL-KRILL OIL PO Take 1,000 mg by mouth daily.     Marland Kitchen gabapentin (NEURONTIN) 100 MG capsule Take 2 capsules (200 mg total) by mouth at bedtime. 180 capsule 1  . levothyroxine (SYNTHROID) 125 MCG tablet TAKE 1 TABLET BY MOUTH ONCE DAILY BEFORE BREAKFAST 90 tablet 1  . montelukast (SINGULAIR) 10 MG tablet Take 1 tablet (10 mg total) by mouth at bedtime as needed. (Patient taking differently: Take 10 mg by mouth at bedtime. ) 90  tablet 3  . nitroGLYCERIN (NITROSTAT) 0.4 MG SL tablet Place 1 tablet (0.4 mg total) under the tongue every 5 (five) minutes as needed for chest pain. 90 tablet 3  . vitamin B-12 (CYANOCOBALAMIN) 500 MCG tablet Take 500 mcg by mouth daily.     . clonazePAM (KLONOPIN) 1 MG tablet Take 1 tablet (1 mg total) by mouth 3 (three) times daily as needed. for anxiety 90 tablet 0  . losartan (COZAAR) 25 MG tablet Take 1 tablet (25 mg total) by mouth daily. 90 tablet 1  . sertraline (ZOLOFT) 50 MG tablet Take 1 tablet (50 mg total) by mouth daily. 30 tablet 3   No facility-administered medications prior to visit.     Allergies  Allergen Reactions  . Niaspan [Niacin Er] Nausea And Vomiting and Swelling    Swelling in mouth  . Pantoprazole     Mouth sores  . Bupropion Other (See Comments)    Uncontrollable shakes  . Nitroglycerin     NOT allergic - cardiology has recommended she NOT use this due to h/o severe carotid disease as it may decrease cerebral perfusion  . Penicillins Hives    DID THE REACTION INVOLVE: Swelling of the face/tongue/throat, SOB, or low BP? Yes Sudden or severe rash/hives, skin peeling, or the inside of the mouth or nose? Unknown Did it require medical treatment? Unknown When did it last happen?[age]  If all above answers are "NO", may proceed with cephalosporin use.  . Statins   . Sulfonamide Derivatives Hives  . Apple Rash  . Banana Rash    Review of Systems  Constitutional: Negative for fever and malaise/fatigue.  HENT: Negative for congestion.   Eyes: Negative for blurred vision.  Respiratory: Negative for shortness of breath.  Cardiovascular: Negative for chest pain, palpitations and leg swelling.  Gastrointestinal: Positive for constipation. Negative for abdominal pain, blood in stool and nausea.  Genitourinary: Negative for dysuria and frequency.  Musculoskeletal: Positive for back pain. Negative for falls.  Skin: Negative for rash.  Neurological: Negative  for dizziness, loss of consciousness and headaches.  Endo/Heme/Allergies: Negative for environmental allergies.  Psychiatric/Behavioral: Negative for depression. The patient is not nervous/anxious.        Objective:    Physical Exam  BP 139/76   Wt 186 lb (84.4 kg)   BMI 32.95 kg/m  Wt Readings from Last 3 Encounters:  02/02/19 186 lb (84.4 kg)  09/26/18 189 lb (85.7 kg)  09/19/18 190 lb (86.2 kg)    Diabetic Foot Exam - Simple   No data filed     Lab Results  Component Value Date   WBC 7.5 09/04/2018   HGB 10.1 (L) 09/04/2018   HCT 32.3 (L) 09/04/2018   PLT 283 09/04/2018   GLUCOSE 102 (H) 09/04/2018   CHOL 172 07/08/2018   TRIG 118.0 07/08/2018   HDL 60.30 07/08/2018   LDLDIRECT 129.0 02/28/2018   LDLCALC 88 07/08/2018   ALT 15 09/04/2018   AST 24 09/04/2018   NA 136 09/04/2018   K 4.3 09/04/2018   CL 106 09/04/2018   CREATININE 2.24 (H) 09/04/2018   BUN 41 (H) 09/04/2018   CO2 24 09/04/2018   TSH 0.84 07/08/2018   INR 1.79 07/18/2018   HGBA1C 5.5 07/08/2018    Lab Results  Component Value Date   TSH 0.84 07/08/2018   Lab Results  Component Value Date   WBC 7.5 09/04/2018   HGB 10.1 (L) 09/04/2018   HCT 32.3 (L) 09/04/2018   MCV 92.6 09/04/2018   PLT 283 09/04/2018   Lab Results  Component Value Date   NA 136 09/04/2018   K 4.3 09/04/2018   CO2 24 09/04/2018   GLUCOSE 102 (H) 09/04/2018   BUN 41 (H) 09/04/2018   CREATININE 2.24 (H) 09/04/2018   BILITOT 0.4 09/04/2018   ALKPHOS 45 09/04/2018   AST 24 09/04/2018   ALT 15 09/04/2018   PROT 6.6 09/04/2018   ALBUMIN 3.7 09/04/2018   CALCIUM 9.2 09/04/2018   ANIONGAP 6 09/04/2018   GFR 19.09 (L) 02/28/2018   Lab Results  Component Value Date   CHOL 172 07/08/2018   Lab Results  Component Value Date   HDL 60.30 07/08/2018   Lab Results  Component Value Date   LDLCALC 88 07/08/2018   Lab Results  Component Value Date   TRIG 118.0 07/08/2018   Lab Results  Component Value Date    CHOLHDL 3 07/08/2018   Lab Results  Component Value Date   HGBA1C 5.5 07/08/2018       Assessment & Plan:   Problem List Items Addressed This Visit    Constipation    Encouraged increased hydration and fiber in diet. Daily probiotics. If bowels not moving can use MOM 2 tbls po in 4 oz of warm prune juice by mouth every 2-3 days. If no results then repeat in 4 hours with  Dulcolax suppository pr, may repeat again in 4 more hours as needed. Seek care if symptoms worsen. Consider daily Miralax and/or Dulcolax if symptoms persist. MIralax and benefiber together once or twice a day      CKD (chronic kidney disease), stage IV (HCC)    Hydrate and monitor      Hyperlipidemia, mixed    Encouraged   heart healthy diet, increase exercise, avoid trans fats and simple carbs.       Relevant Medications   losartan (COZAAR) 25 MG tablet   Other Relevant Orders   Lipid Profile   Anemia   Relevant Orders   CBC   TSH   Ferritin   Essential hypertension    Encouraged to check vital signs weekly and report any concerns.  no changes to meds. Encouraged heart healthy diet such as the DASH diet and exercise as tolerated.       Relevant Medications   losartan (COZAAR) 25 MG tablet   Other Relevant Orders   Comp Met (CMET)   Hyperglycemia    hgba1c acceptable, minimize simple carbs. Increase exercise as tolerated.       Relevant Orders   HgB A1c   Vitamin D deficiency - Primary    Supplement and monitor      Relevant Orders   Vitamin D 1,25 dihydroxy   Back pain    Encouraged moist heat and gentle stretching as tolerated. May try NSAIDs and prescription meds as directed and report if symptoms worsen or seek immediate care         I am having Rozell M. Fake maintain her CALCIUM CITRATE-VITAMIN D PO, Cholecalciferol, vitamin B-12, acetaminophen, FISH OIL-KRILL OIL PO, cetirizine, montelukast, aspirin EC, Dexlansoprazole, nitroGLYCERIN, clopidogrel, albuterol, ezetimibe, Fenofibric  Acid, levothyroxine, gabapentin, budesonide-formoterol, sertraline, clonazePAM, and losartan.  Meds ordered this encounter  Medications  . sertraline (ZOLOFT) 50 MG tablet    Sig: Take 1 tablet (50 mg total) by mouth daily.    Dispense:  90 tablet    Refill:  1    D/C script for Celexa  . clonazePAM (KLONOPIN) 1 MG tablet    Sig: Take 1 tablet (1 mg total) by mouth 3 (three) times daily as needed. for anxiety    Dispense:  90 tablet    Refill:  1  . losartan (COZAAR) 25 MG tablet    Sig: Take 1 tablet (25 mg total) by mouth daily.    Dispense:  90 tablet    Refill:  1    I discussed the assessment and treatment plan with the patient. The patient was provided an opportunity to ask questions and all were answered. The patient agreed with the plan and demonstrated an understanding of the instructions.   The patient was advised to call back or seek an in-person evaluation if the symptoms worsen or if the condition fails to improve as anticipated.  I provided 25 minutes of non-face-to-face time during this encounter.   Stacey Blyth, MD  

## 2019-02-02 NOTE — Assessment & Plan Note (Signed)
Hydrate and monitor 

## 2019-02-02 NOTE — Assessment & Plan Note (Signed)
Encouraged moist heat and gentle stretching as tolerated. May try NSAIDs and prescription meds as directed and report if symptoms worsen or seek immediate care 

## 2019-02-02 NOTE — Assessment & Plan Note (Signed)
Encouraged to check vital signs weekly and report any concerns. no changes to meds. Encouraged heart healthy diet such as the DASH diet and exercise as tolerated.  

## 2019-02-02 NOTE — Assessment & Plan Note (Signed)
Supplement and monitor 

## 2019-02-02 NOTE — Assessment & Plan Note (Signed)
Encouraged heart healthy diet, increase exercise, avoid trans fats and simple carbs.

## 2019-02-02 NOTE — Assessment & Plan Note (Signed)
hgba1c acceptable, minimize simple carbs. Increase exercise as tolerated.  

## 2019-02-09 ENCOUNTER — Encounter: Payer: Self-pay | Admitting: Family Medicine

## 2019-02-10 ENCOUNTER — Other Ambulatory Visit: Payer: Self-pay

## 2019-02-10 ENCOUNTER — Other Ambulatory Visit (INDEPENDENT_AMBULATORY_CARE_PROVIDER_SITE_OTHER): Payer: Medicare HMO

## 2019-02-10 DIAGNOSIS — I10 Essential (primary) hypertension: Secondary | ICD-10-CM | POA: Diagnosis not present

## 2019-02-10 DIAGNOSIS — D649 Anemia, unspecified: Secondary | ICD-10-CM | POA: Diagnosis not present

## 2019-02-10 DIAGNOSIS — R739 Hyperglycemia, unspecified: Secondary | ICD-10-CM

## 2019-02-10 DIAGNOSIS — E782 Mixed hyperlipidemia: Secondary | ICD-10-CM

## 2019-02-10 DIAGNOSIS — E559 Vitamin D deficiency, unspecified: Secondary | ICD-10-CM | POA: Diagnosis not present

## 2019-02-10 LAB — CBC
HCT: 37.1 % (ref 36.0–46.0)
Hemoglobin: 12.4 g/dL (ref 12.0–15.0)
MCHC: 33.5 g/dL (ref 30.0–36.0)
MCV: 91.4 fl (ref 78.0–100.0)
Platelets: 229 10*3/uL (ref 150.0–400.0)
RBC: 4.06 Mil/uL (ref 3.87–5.11)
RDW: 13 % (ref 11.5–15.5)
WBC: 5.8 10*3/uL (ref 4.0–10.5)

## 2019-02-10 LAB — COMPREHENSIVE METABOLIC PANEL
ALT: 13 U/L (ref 0–35)
AST: 17 U/L (ref 0–37)
Albumin: 4.1 g/dL (ref 3.5–5.2)
Alkaline Phosphatase: 41 U/L (ref 39–117)
BUN: 39 mg/dL — ABNORMAL HIGH (ref 6–23)
CO2: 26 mEq/L (ref 19–32)
Calcium: 9.3 mg/dL (ref 8.4–10.5)
Chloride: 107 mEq/L (ref 96–112)
Creatinine, Ser: 1.86 mg/dL — ABNORMAL HIGH (ref 0.40–1.20)
GFR: 26.71 mL/min — ABNORMAL LOW (ref >60.00)
Glucose, Bld: 99 mg/dL (ref 70–99)
Potassium: 4.1 mEq/L (ref 3.5–5.1)
Sodium: 143 mEq/L (ref 135–145)
Total Bilirubin: 0.4 mg/dL (ref 0.2–1.2)
Total Protein: 6.3 g/dL (ref 6.0–8.3)

## 2019-02-10 LAB — LIPID PANEL
Cholesterol: 146 mg/dL (ref 0–200)
HDL: 47.7 mg/dL (ref >39.00)
LDL Cholesterol: 70 mg/dL (ref 0–99)
NonHDL: 98.74
Total CHOL/HDL Ratio: 3
Triglycerides: 144 mg/dL (ref 0.0–149.0)
VLDL: 28.8 mg/dL (ref 0.0–40.0)

## 2019-02-10 LAB — TSH: TSH: 0.46 u[IU]/mL (ref 0.35–4.50)

## 2019-02-10 LAB — HEMOGLOBIN A1C: Hgb A1c MFr Bld: 5.2 % (ref 4.6–6.5)

## 2019-02-10 LAB — FERRITIN: Ferritin: 316.8 ng/mL — ABNORMAL HIGH (ref 10.0–291.0)

## 2019-02-13 ENCOUNTER — Encounter: Payer: Self-pay | Admitting: Family Medicine

## 2019-02-13 LAB — VITAMIN D 1,25 DIHYDROXY
Vitamin D 1, 25 (OH)2 Total: 57 pg/mL (ref 18–72)
Vitamin D2 1, 25 (OH)2: <8 pg/mL
Vitamin D3 1, 25 (OH)2: 57 pg/mL

## 2019-02-14 ENCOUNTER — Other Ambulatory Visit: Payer: Self-pay | Admitting: Family Medicine

## 2019-02-14 MED ORDER — FLUOXETINE HCL 10 MG PO TABS: 10.0000 mg | ORAL_TABLET | Freq: Every day | ORAL | 1 refills | Status: DC

## 2019-02-17 ENCOUNTER — Other Ambulatory Visit: Payer: Self-pay | Admitting: Family Medicine

## 2019-02-17 MED ORDER — FLUOXETINE HCL 20 MG PO TABS: 20.0000 mg | ORAL_TABLET | Freq: Every day | ORAL | 3 refills | Status: DC

## 2019-02-18 ENCOUNTER — Telehealth: Payer: Self-pay | Admitting: Family Medicine

## 2019-02-18 NOTE — Telephone Encounter (Signed)
FLUoxetine (PROZAC) 20 MG tablet   was sent to the wrong pharmacy.  Pt needs this sent to  [place], [place] - [address] [phone] (Phone) [phone] (Fax)

## 2019-02-20 DIAGNOSIS — N189 Chronic kidney disease, unspecified: Secondary | ICD-10-CM | POA: Diagnosis not present

## 2019-02-20 DIAGNOSIS — N183 Chronic kidney disease, stage 3 (moderate): Secondary | ICD-10-CM | POA: Diagnosis not present

## 2019-02-26 DIAGNOSIS — E669 Obesity, unspecified: Secondary | ICD-10-CM | POA: Diagnosis not present

## 2019-02-26 DIAGNOSIS — D631 Anemia in chronic kidney disease: Secondary | ICD-10-CM | POA: Diagnosis not present

## 2019-02-26 DIAGNOSIS — N2581 Secondary hyperparathyroidism of renal origin: Secondary | ICD-10-CM | POA: Diagnosis not present

## 2019-02-26 DIAGNOSIS — N179 Acute kidney failure, unspecified: Secondary | ICD-10-CM | POA: Diagnosis not present

## 2019-02-26 DIAGNOSIS — E785 Hyperlipidemia, unspecified: Secondary | ICD-10-CM | POA: Diagnosis not present

## 2019-02-26 DIAGNOSIS — N184 Chronic kidney disease, stage 4 (severe): Secondary | ICD-10-CM | POA: Diagnosis not present

## 2019-02-26 DIAGNOSIS — I509 Heart failure, unspecified: Secondary | ICD-10-CM | POA: Diagnosis not present

## 2019-02-26 DIAGNOSIS — I129 Hypertensive chronic kidney disease with stage 1 through stage 4 chronic kidney disease, or unspecified chronic kidney disease: Secondary | ICD-10-CM | POA: Diagnosis not present

## 2019-02-27 DIAGNOSIS — Z1231 Encounter for screening mammogram for malignant neoplasm of breast: Secondary | ICD-10-CM | POA: Diagnosis not present

## 2019-03-06 ENCOUNTER — Ambulatory Visit (INDEPENDENT_AMBULATORY_CARE_PROVIDER_SITE_OTHER): Payer: Medicare HMO | Admitting: Family Medicine

## 2019-03-06 ENCOUNTER — Encounter: Payer: Self-pay | Admitting: Family Medicine

## 2019-03-06 ENCOUNTER — Other Ambulatory Visit: Payer: Self-pay

## 2019-03-06 VITALS — BP 111/74 | Wt 184.0 lb

## 2019-03-06 DIAGNOSIS — E038 Other specified hypothyroidism: Secondary | ICD-10-CM

## 2019-03-06 DIAGNOSIS — R490 Dysphonia: Secondary | ICD-10-CM | POA: Diagnosis not present

## 2019-03-06 DIAGNOSIS — N289 Disorder of kidney and ureter, unspecified: Secondary | ICD-10-CM

## 2019-03-06 DIAGNOSIS — E782 Mixed hyperlipidemia: Secondary | ICD-10-CM

## 2019-03-06 DIAGNOSIS — M542 Cervicalgia: Secondary | ICD-10-CM | POA: Diagnosis not present

## 2019-03-06 DIAGNOSIS — E559 Vitamin D deficiency, unspecified: Secondary | ICD-10-CM

## 2019-03-06 DIAGNOSIS — R739 Hyperglycemia, unspecified: Secondary | ICD-10-CM | POA: Diagnosis not present

## 2019-03-06 MED ORDER — LEVOTHYROXINE SODIUM 125 MCG PO TABS: ORAL_TABLET | ORAL | 1 refills | Status: DC

## 2019-03-06 MED ORDER — GABAPENTIN 100 MG PO CAPS: 200.0000 mg | ORAL_CAPSULE | Freq: Every day | ORAL | 1 refills | Status: DC

## 2019-03-07 ENCOUNTER — Encounter: Payer: Self-pay | Admitting: Cardiology

## 2019-03-07 ENCOUNTER — Other Ambulatory Visit: Payer: Self-pay

## 2019-03-07 ENCOUNTER — Ambulatory Visit (INDEPENDENT_AMBULATORY_CARE_PROVIDER_SITE_OTHER): Payer: Medicare HMO | Admitting: Cardiology

## 2019-03-07 VITALS — BP 130/62 | HR 85 | Ht 63.0 in | Wt 187.0 lb

## 2019-03-07 DIAGNOSIS — I10 Essential (primary) hypertension: Secondary | ICD-10-CM

## 2019-03-07 DIAGNOSIS — G4733 Obstructive sleep apnea (adult) (pediatric): Secondary | ICD-10-CM

## 2019-03-07 DIAGNOSIS — I2510 Atherosclerotic heart disease of native coronary artery without angina pectoris: Secondary | ICD-10-CM | POA: Diagnosis not present

## 2019-03-07 NOTE — Progress Notes (Signed)
Cardiology Office Note:    Date:  03/07/2019   ID:  Emily Mitchell, DOB 02-14-1946, MRN [id number]  PCP:  Mosie Lukes, MD  Cardiologist:  Jenean Lindau, MD   Referring MD: Mosie Lukes, MD    ASSESSMENT:    1. Coronary artery disease involving native coronary artery of native heart without angina pectoris   2. Essential hypertension   3. OSA (obstructive sleep apnea)    PLAN:    In order of problems listed above:  1. Coronary artery disease: Secondary prevention stressed with the patient.  Importance of compliance with diet and medication stressed and she vocalized understanding.  Her blood pressure stable. 2. Mixed dyslipidemia: Diet was discussed.  Lipids were reviewed with her 3. Renal insufficiency: I discussed this with her.  This is primarily managed by her primary care physician. 4. Patient will be seen in follow-up appointment in 6 months or earlier if the patient has any concerns    Medication Adjustments/Labs and Tests Ordered: Current medicines are reviewed at length with the patient today.  Concerns regarding medicines are outlined above.  No orders of the defined types were placed in this encounter.  No orders of the defined types were placed in this encounter.    No chief complaint on file.    History of Present Illness:    Emily Mitchell is a 73 y.o. female.  Patient has past medical history of coronary artery disease, essential hypertension and dyslipidemia.  She denies any problems at this time and takes care of activities of daily living.  No chest pain orthopnea or PND.  She leads an active lifestyle.  At the time of my evaluation, the patient is alert awake oriented and in no distress.  Past Medical History:  Diagnosis Date   Anemia 07/05/2014   Anxiety and depression 12/14/2008   Qualifier: Diagnosis of  By: Emily Simmering LPN, Almyra Free     Arthritis    "fingers, toes, knees, joints" (07/18/2018)   Asthma    Atypical chest pain 05/23/2015    CAD (coronary artery disease)    a. [place] with cath 06/2018 s/p DES to LAD.   Carotid artery occlusion    a. R carotid occluded, 44-96% LICA.   CHF (congestive heart failure) (HCC)    Chicken pox as a child   Chronic kidney disease    Bright's Disease at age 74    COPD (chronic obstructive pulmonary disease) (HCC)    GERD (gastroesophageal reflux disease)    H. pylori infection 10/19/2013   Hepatitis 1970s   "don't know for sure which one it was; think it was A" (07/18/2018)   Hyperglycemia 03/14/2016   Hyperlipidemia    Hypertension    Hypothyroidism    Knee pain, bilateral 12/17/2012   Left hip pain 05/23/2015   Low back pain 09/19/2015   Macular degeneration of both eyes 12/16/2015   Right eye is wet Left Eye is dry Shots every 11 to 12 weeks in right eye at opthamologist office   Medicare annual wellness visit, subsequent 02/21/2015   Mumps 73 yrs old   Pneumonia    "at least once" (07/18/2018)   Preventative health care 03/14/2016   Pulmonary emboli ([place]) 05/26/2013   Sleep apnea    "don't use CPAP anymore; dr said I didn't have to" (07/18/2018)   Stroke [facility])    "been told I've had some strokes; didn't know I'd had them" (07/18/2018)   Tremor    UTI (lower urinary  tract infection) 10/19/2013    Past Surgical History:  Procedure Laterality Date   APPENDECTOMY  1984   CARDIAC CATHETERIZATION  ~ 2006   CAROTID ANGIOGRAPHY Left 08/20/2018   Procedure: CAROTID ANGIOGRAPHY;  Surgeon: Emily Mitchell, MD;  Location: [facility];  Service: Cardiovascular;  Laterality: Left;   CAROTID ENDARTERECTOMY Right 05/10/07   cea   CATARACT EXTRACTION Left    CATARACT EXTRACTION W/ INTRAOCULAR LENS  IMPLANT, BILATERAL Bilateral    CORONARY ANGIOPLASTY WITH STENT PLACEMENT  07/18/2018   CORONARY STENT INTERVENTION N/A 07/18/2018   Procedure: CORONARY STENT INTERVENTION;  Surgeon: Lorretta Harp, MD;  Location: [place];  Service:  Cardiovascular;  Laterality: N/A;   INTRAVASCULAR PRESSURE WIRE/FFR STUDY N/A 07/18/2018   Procedure: INTRAVASCULAR PRESSURE WIRE/FFR STUDY;  Surgeon: Lorretta Harp, MD;  Location: [facility];  Service: Cardiovascular;  Laterality: N/A;   JOINT REPLACEMENT     LEFT HEART CATH AND CORONARY ANGIOGRAPHY N/A 07/18/2018   Procedure: LEFT HEART CATH AND CORONARY ANGIOGRAPHY;  Surgeon: Lorretta Harp, MD;  Location: [facility];  Service: Cardiovascular;  Laterality: N/A;   MULTIPLE TOOTH EXTRACTIONS     TOTAL KNEE ARTHROPLASTY Bilateral 2005 - 2011   right - left   VAGINAL HYSTERECTOMY  1984   WISDOM TOOTH EXTRACTION      Current Medications: Current Meds  Medication Sig   acetaminophen (TYLENOL) 500 MG tablet Take 500 mg by mouth every 6 (six) hours as needed for moderate pain.    albuterol (PROVENTIL HFA;VENTOLIN HFA) 108 (90 Base) MCG/ACT inhaler Inhale 2 puffs into the lungs every 6 (six) hours as needed for wheezing or shortness of breath.   aspirin EC 81 MG tablet Take 1 tablet (81 mg total) by mouth daily.   budesonide-formoterol (SYMBICORT) 80-4.5 MCG/ACT inhaler Inhale 2 puffs into the lungs 2 (two) times daily.   Cholecalciferol (D3 MAXIMUM STRENGTH) 5000 UNITS capsule Take 5,000 Units by mouth daily.   Choline Fenofibrate (FENOFIBRIC ACID) 135 MG CPDR Take 1 capsule by mouth daily.   clonazePAM (KLONOPIN) 1 MG tablet Take 1 tablet (1 mg total) by mouth 3 (three) times daily as needed. for anxiety   clopidogrel (PLAVIX) 75 MG tablet Take 1 tablet (75 mg total) by mouth daily.   Dexlansoprazole (DEXILANT) 30 MG capsule Take 1 capsule (30 mg total) by mouth daily. Talk to primary care doctor about continuation of medicine.   ezetimibe (ZETIA) 10 MG tablet Take 1 tablet (10 mg total) by mouth daily.   FISH OIL-KRILL OIL PO Take 1,000 mg by mouth daily.    FLUoxetine (PROZAC) 20 MG tablet Take 1 tablet (20 mg total) by mouth daily.   gabapentin  (NEURONTIN) 100 MG capsule Take 2 capsules (200 mg total) by mouth at bedtime.   levothyroxine (SYNTHROID) 125 MCG tablet TAKE 1 TABLET BY MOUTH ONCE DAILY BEFORE BREAKFAST   losartan (COZAAR) 25 MG tablet Take 1 tablet (25 mg total) by mouth daily.   montelukast (SINGULAIR) 10 MG tablet Take 1 tablet (10 mg total) by mouth at bedtime as needed.   vitamin B-12 (CYANOCOBALAMIN) 500 MCG tablet Take 500 mcg by mouth daily.      Allergies:   Niaspan [niacin er], Pantoprazole, Bupropion, Nitroglycerin, Penicillins, Statins, Sulfonamide derivatives, Apple, and Banana   Social History   Socioeconomic History   Marital status: Divorced    Spouse name: Not on file   Number of children: 2   Years of  education: Not on file   Highest education level: Not on file  Occupational History   Not on file  Social Needs   Financial resource strain: Not on file   Food insecurity    Worry: Not on file    Inability: Not on file   Transportation needs    Medical: Not on file    Non-medical: Not on file  Tobacco Use   Smoking status: Former Smoker    Packs/day: 2.00    Years: 30.00    Pack years: 60.00    Types: Cigarettes    Quit date: 12/03/1994    Years since quitting: 24.2   Smokeless tobacco: Never Used  Substance and Sexual Activity   Alcohol use: Yes    Comment: 07/18/2018 "1 drink q 1-2 months; if that"   Drug use: Never   Sexual activity: Not Currently    Comment: lives with son and friend, no dietary restrictions  Lifestyle   Physical activity    Days per week: Not on file    Minutes per session: Not on file   Stress: Not on file  Relationships   Social connections    Talks on phone: Not on file    Gets together: Not on file    Attends religious service: Not on file    Active member of club or organization: Not on file    Attends meetings of clubs or organizations: Not on file    Relationship status: Not on file  Other Topics Concern   Not on file  Social  History Narrative   epworth sleepiness scale = 11 (06/15/2015)     Family History: The patient's family history includes Allergic rhinitis in her sister; Diabetes in her sister; Heart attack in her maternal grandfather; Heart failure in her mother; Hyperlipidemia in her sister; Hypertension in her mother; Kidney disease in her mother and sister; Stroke in her mother.  ROS:   Please see the history of present illness.    All other systems reviewed and are negative.  EKGs/Labs/Other Studies Reviewed:    The following studies were reviewed today: As mentioned above   Recent Labs: 09/04/2018: B Natriuretic Peptide 38.9 02/10/2019: ALT 13; BUN 39; Creatinine, Ser 1.86; Hemoglobin 12.4; Platelets 229.0; Potassium 4.1; Sodium 143; TSH 0.46  Recent Lipid Panel    Component Value Date/Time   CHOL 146 02/10/2019 1341   TRIG 144.0 02/10/2019 1341   HDL 47.70 02/10/2019 1341   CHOLHDL 3 02/10/2019 1341   VLDL 28.8 02/10/2019 1341   LDLCALC 70 02/10/2019 1341   LDLDIRECT 129.0 02/28/2018 1458    Physical Exam:    VS:  BP 130/62    Pulse 85    Ht 5\' 3"  (1.6 m)    Wt 187 lb (84.8 kg)    SpO2 97%    BMI 33.13 kg/m     Wt Readings from Last 3 Encounters:  03/07/19 187 lb (84.8 kg)  03/06/19 184 lb (83.5 kg)  02/02/19 186 lb (84.4 kg)     GEN: Patient is in no acute distress HEENT: Normal NECK: No JVD; No carotid bruits LYMPHATICS: No lymphadenopathy CARDIAC: Hear sounds regular, 2/6 systolic murmur at the apex. RESPIRATORY:  Clear to auscultation without rales, wheezing or rhonchi  ABDOMEN: Soft, non-tender, non-distended MUSCULOSKELETAL:  No edema; No deformity  SKIN: Warm and dry NEUROLOGIC:  Alert and oriented x 3 PSYCHIATRIC:  Normal affect   Signed, Jenean Lindau, MD  03/07/2019 9:48 AM    [facility]

## 2019-03-07 NOTE — Patient Instructions (Addendum)

## 2019-03-09 DIAGNOSIS — R490 Dysphonia: Secondary | ICD-10-CM | POA: Insufficient documentation

## 2019-03-09 HISTORY — DX: Hypercalcemia: E83.52

## 2019-03-09 HISTORY — DX: Dysphonia: R49.0

## 2019-03-09 NOTE — Assessment & Plan Note (Signed)
Referred to ENT for further evaluation. 

## 2019-03-09 NOTE — Progress Notes (Signed)
Virtual Visit via Video Note  I connected with Emily Mitchell on 03/09/19 at  2:00 PM EDT by a video enabled telemedicine application and verified that I am speaking with the correct person using two identifiers.  Location: Patient: home Provider: office   I discussed the limitations of evaluation and management by telemedicine and the availability of in person appointments. The patient expressed understanding and agreed to proceed.   Magdalene Molly, CMA was able to get patient set up on visit, video    Subjective:    Patient ID: Emily Mitchell, female    DOB: January 27, 1946, 73 y.o.   MRN: [id number]  No chief complaint on file.   HPI Patient is in today for follow up on chronic medical concerns including diarrhea which she notes is improving. No bloody or tarry stool. No recent febrile illness or hospitalizations. She is following with nephrology and they had her stop all of her calcium supplements. Denies CP/palp/SOB/HA/congestion/fevers/GI or GU c/o. Taking meds as prescribed  Past Medical History:  Diagnosis Date  . Anemia 07/05/2014  . Anxiety and depression 12/14/2008   Qualifier: Diagnosis of  By: [name] LPN, Almyra Free    . Arthritis    "fingers, toes, knees, joints" (07/18/2018)  . Asthma   . Atypical chest pain 05/23/2015  . CAD (coronary artery disease)    a. [place] with cath 06/2018 s/p DES to LAD.  Marland Kitchen Carotid artery occlusion    a. R carotid occluded, 40-98% LICA.  Marland Kitchen CHF (congestive heart failure) ([place])   . Chicken pox as a child  . Chronic kidney disease    Bright's Disease at age 12   . COPD (chronic obstructive pulmonary disease) ([place])   . GERD (gastroesophageal reflux disease)   . H. pylori infection 10/19/2013  . Hepatitis 1970s   "don't know for sure which one it was; think it was A" (07/18/2018)  . Hyperglycemia 03/14/2016  . Hyperlipidemia   . Hypertension   . Hypothyroidism   . Knee pain, bilateral 12/17/2012  . Left hip pain 05/23/2015  . Low back pain  09/19/2015  . Macular degeneration of both eyes 12/16/2015   Right eye is wet Left Eye is dry Shots every 11 to 12 weeks in right eye at opthamologist office  . Medicare annual wellness visit, subsequent 02/21/2015  . Mumps 73 yrs old  . Pneumonia    "at least once" (07/18/2018)  . Preventative health care 03/14/2016  . Pulmonary emboli ([place]) 05/26/2013  . Sleep apnea    "don't use CPAP anymore; dr said I didn't have to" (07/18/2018)  . Stroke [facility])    "been told I've had some strokes; didn't know I'd had them" (07/18/2018)  . Tremor   . UTI (lower urinary tract infection) 10/19/2013    Past Surgical History:  Procedure Laterality Date  . APPENDECTOMY  1984  . CARDIAC CATHETERIZATION  ~ 2006  . CAROTID ANGIOGRAPHY Left 08/20/2018   Procedure: CAROTID ANGIOGRAPHY;  Surgeon: Serafina Mitchell, MD;  Location: [facility];  Service: Cardiovascular;  Laterality: Left;  . CAROTID ENDARTERECTOMY Right 05/10/07   cea  . CATARACT EXTRACTION Left   . CATARACT EXTRACTION W/ INTRAOCULAR LENS  IMPLANT, BILATERAL Bilateral   . CORONARY ANGIOPLASTY WITH STENT PLACEMENT  07/18/2018  . CORONARY STENT INTERVENTION N/A 07/18/2018   Procedure: CORONARY STENT INTERVENTION;  Surgeon: Lorretta Harp, MD;  Location: [facility] [facility];  Service: Cardiovascular;  Laterality: N/A;  . INTRAVASCULAR PRESSURE WIRE/FFR STUDY N/A 07/18/2018  Procedure: INTRAVASCULAR PRESSURE WIRE/FFR STUDY;  Surgeon: Lorretta Harp, MD;  Location: [facility] [facility];  Service: Cardiovascular;  Laterality: N/A;  . JOINT REPLACEMENT    . LEFT HEART CATH AND CORONARY ANGIOGRAPHY N/A 07/18/2018   Procedure: LEFT HEART CATH AND CORONARY ANGIOGRAPHY;  Surgeon: Lorretta Harp, MD;  Location: [facility];  Service: Cardiovascular;  Laterality: N/A;  . MULTIPLE TOOTH EXTRACTIONS    . TOTAL KNEE ARTHROPLASTY Bilateral 2005 - 2011   right - left  . VAGINAL HYSTERECTOMY  1984  . WISDOM TOOTH EXTRACTION      Family  History  Problem Relation Age of Onset  . Stroke Mother        mini stroke  . Kidney disease Mother   . Heart failure Mother   . Hypertension Mother   . Diabetes Sister        type 2  . Kidney disease Sister   . Allergic rhinitis Sister   . Heart attack Maternal Grandfather   . Hyperlipidemia Sister     Social History   Socioeconomic History  . Marital status: Divorced    Spouse name: Not on file  . Number of children: 2  . Years of education: Not on file  . Highest education level: Not on file  Occupational History  . Not on file  Social Needs  . Financial resource strain: Not on file  . Food insecurity    Worry: Not on file    Inability: Not on file  . Transportation needs    Medical: Not on file    Non-medical: Not on file  Tobacco Use  . Smoking status: Former Smoker    Packs/day: 2.00    Years: 30.00    Pack years: 60.00    Types: Cigarettes    Quit date: 12/03/1994    Years since quitting: 24.2  . Smokeless tobacco: Never Used  Substance and Sexual Activity  . Alcohol use: Yes    Comment: 07/18/2018 "1 drink q 1-2 months; if that"  . Drug use: Never  . Sexual activity: Not Currently    Comment: lives with son and friend, no dietary restrictions  Lifestyle  . Physical activity    Days per week: Not on file    Minutes per session: Not on file  . Stress: Not on file  Relationships  . Social Herbalist on phone: Not on file    Gets together: Not on file    Attends religious service: Not on file    Active member of club or organization: Not on file    Attends meetings of clubs or organizations: Not on file    Relationship status: Not on file  . Intimate partner violence    Fear of current or ex partner: Not on file    Emotionally abused: Not on file    Physically abused: Not on file    Forced sexual activity: Not on file  Other Topics Concern  . Not on file  Social History Narrative   epworth sleepiness scale = 11 (06/15/2015)     Outpatient Medications Prior to Visit  Medication Sig Dispense Refill  . acetaminophen (TYLENOL) 500 MG tablet Take 500 mg by mouth every 6 (six) hours as needed for moderate pain.     Marland Kitchen albuterol (PROVENTIL HFA;VENTOLIN HFA) 108 (90 Base) MCG/ACT inhaler Inhale 2 puffs into the lungs every 6 (six) hours as needed for wheezing or shortness of breath.    Marland Kitchen  aspirin EC 81 MG tablet Take 1 tablet (81 mg total) by mouth daily. 90 tablet 3  . budesonide-formoterol (SYMBICORT) 80-4.5 MCG/ACT inhaler Inhale 2 puffs into the lungs 2 (two) times daily. 1 Inhaler 5  . Cholecalciferol (D3 MAXIMUM STRENGTH) 5000 UNITS capsule Take 5,000 Units by mouth daily.    . Choline Fenofibrate (FENOFIBRIC ACID) 135 MG CPDR Take 1 capsule by mouth daily. 90 capsule 1  . clonazePAM (KLONOPIN) 1 MG tablet Take 1 tablet (1 mg total) by mouth 3 (three) times daily as needed. for anxiety 90 tablet 1  . clopidogrel (PLAVIX) 75 MG tablet Take 1 tablet (75 mg total) by mouth daily. 90 tablet 6  . Dexlansoprazole (DEXILANT) 30 MG capsule Take 1 capsule (30 mg total) by mouth daily. Talk to primary care doctor about continuation of medicine. 30 capsule 1  . ezetimibe (ZETIA) 10 MG tablet Take 1 tablet (10 mg total) by mouth daily. 90 tablet 1  . FISH OIL-KRILL OIL PO Take 1,000 mg by mouth daily.     Marland Kitchen FLUoxetine (PROZAC) 20 MG tablet Take 1 tablet (20 mg total) by mouth daily. 30 tablet 3  . losartan (COZAAR) 25 MG tablet Take 1 tablet (25 mg total) by mouth daily. 90 tablet 1  . montelukast (SINGULAIR) 10 MG tablet Take 1 tablet (10 mg total) by mouth at bedtime as needed. 90 tablet 3  . vitamin B-12 (CYANOCOBALAMIN) 500 MCG tablet Take 500 mcg by mouth daily.     Marland Kitchen CALCIUM CITRATE-VITAMIN D PO Take 1 tablet by mouth daily.     . cetirizine (ZYRTEC) 10 MG tablet Take 1 tablet (10 mg total) by mouth daily as needed for allergies. (Patient not taking: Reported on 03/07/2019) 30 tablet 11  . gabapentin (NEURONTIN) 100 MG capsule Take  2 capsules (200 mg total) by mouth at bedtime. 180 capsule 1  . levothyroxine (SYNTHROID) 125 MCG tablet TAKE 1 TABLET BY MOUTH ONCE DAILY BEFORE BREAKFAST 90 tablet 1  . nitroGLYCERIN (NITROSTAT) 0.4 MG SL tablet Place 1 tablet (0.4 mg total) under the tongue every 5 (five) minutes as needed for chest pain. 90 tablet 3   No facility-administered medications prior to visit.     Allergies  Allergen Reactions  . Niaspan [Niacin Er] Nausea And Vomiting and Swelling    Swelling in mouth  . Pantoprazole     Mouth sores  . Bupropion Other (See Comments)    Uncontrollable shakes  . Nitroglycerin     NOT allergic - cardiology has recommended she NOT use this due to h/o severe carotid disease as it may decrease cerebral perfusion  . Penicillins Hives    DID THE REACTION INVOLVE: Swelling of the face/tongue/throat, SOB, or low BP? Yes Sudden or severe rash/hives, skin peeling, or the inside of the mouth or nose? Unknown Did it require medical treatment? Unknown When did it last happen?[age]  If all above answers are "NO", may proceed with cephalosporin use.  . Statins   . Sulfonamide Derivatives Hives  . Apple Rash  . Banana Rash    Review of Systems  Constitutional: Negative for fever and malaise/fatigue.  HENT: Negative for congestion.   Eyes: Negative for blurred vision.  Respiratory: Negative for shortness of breath.   Cardiovascular: Negative for chest pain, palpitations and leg swelling.  Gastrointestinal: Positive for diarrhea. Negative for abdominal pain, blood in stool and nausea.  Genitourinary: Negative for dysuria and frequency.  Musculoskeletal: Negative for falls.  Skin: Negative for rash.  Neurological: Negative for dizziness, loss of consciousness and headaches.  Endo/Heme/Allergies: Negative for environmental allergies.  Psychiatric/Behavioral: Negative for depression. The patient is not nervous/anxious.        Objective:    Physical Exam Nursing note  reviewed.  Constitutional:      General: She is not in acute distress.    Appearance: Normal appearance. She is well-developed. She is not ill-appearing.  HENT:     Head: Normocephalic and atraumatic.     Nose: Nose normal.  Eyes:     General:        Right eye: No discharge.        Left eye: No discharge.  Neck:     Musculoskeletal: Normal range of motion and neck supple.  Cardiovascular:     Rate and Rhythm: Normal rate and regular rhythm.     Heart sounds: No murmur.  Pulmonary:     Effort: Pulmonary effort is normal.  Abdominal:     Tenderness: There is no abdominal tenderness.  Skin:    General: Skin is warm and dry.  Neurological:     Mental Status: She is alert and oriented to person, place, and time.     BP 111/74 (BP Location: Left Arm, Patient Position: Sitting, Cuff Size: Normal)   Wt 184 lb (83.5 kg)   SpO2 97%   BMI 32.59 kg/m  Wt Readings from Last 3 Encounters:  03/07/19 187 lb (84.8 kg)  03/06/19 184 lb (83.5 kg)  02/02/19 186 lb (84.4 kg)    Diabetic Foot Exam - Simple   No data filed     Lab Results  Component Value Date   WBC 5.8 02/10/2019   HGB 12.4 02/10/2019   HCT 37.1 02/10/2019   PLT 229.0 02/10/2019   GLUCOSE 99 02/10/2019   CHOL 146 02/10/2019   TRIG 144.0 02/10/2019   HDL 47.70 02/10/2019   LDLDIRECT 129.0 02/28/2018   LDLCALC 70 02/10/2019   ALT 13 02/10/2019   AST 17 02/10/2019   NA 143 02/10/2019   K 4.1 02/10/2019   CL 107 02/10/2019   CREATININE 1.86 (H) 02/10/2019   BUN 39 (H) 02/10/2019   CO2 26 02/10/2019   TSH 0.46 02/10/2019   INR 1.79 07/18/2018   HGBA1C 5.2 02/10/2019    Lab Results  Component Value Date   TSH 0.46 02/10/2019   Lab Results  Component Value Date   WBC 5.8 02/10/2019   HGB 12.4 02/10/2019   HCT 37.1 02/10/2019   MCV 91.4 02/10/2019   PLT 229.0 02/10/2019   Lab Results  Component Value Date   NA 143 02/10/2019   K 4.1 02/10/2019   CO2 26 02/10/2019   GLUCOSE 99 02/10/2019   BUN 39  (H) 02/10/2019   CREATININE 1.86 (H) 02/10/2019   BILITOT 0.4 02/10/2019   ALKPHOS 41 02/10/2019   AST 17 02/10/2019   ALT 13 02/10/2019   PROT 6.3 02/10/2019   ALBUMIN 4.1 02/10/2019   CALCIUM 9.3 02/10/2019   ANIONGAP 6 09/04/2018   GFR 26.71 (L) 02/10/2019   Lab Results  Component Value Date   CHOL 146 02/10/2019   Lab Results  Component Value Date   HDL 47.70 02/10/2019   Lab Results  Component Value Date   LDLCALC 70 02/10/2019   Lab Results  Component Value Date   TRIG 144.0 02/10/2019   Lab Results  Component Value Date   CHOLHDL 3 02/10/2019   Lab Results  Component Value Date   HGBA1C  5.2 02/10/2019       Assessment & Plan:   Problem List Items Addressed This Visit    Hypothyroidism    On Levothyroxine, continue to monitor      Relevant Medications   levothyroxine (SYNTHROID) 125 MCG tablet   Hyperlipidemia, mixed    Encouraged heart healthy diet, increase exercise, avoid trans fats, consider a krill oil cap daily      Hyperglycemia    hgba1c acceptable, minimize simple carbs. Increase exercise as tolerated.      Neck pain    Gabapentin qhs      Vitamin D deficiency    Supplement and monitor      Renal insufficiency    Hydrate and monitor      Hypercalcemia    Has stopped all supplements on the recommendation of nephrology.      Hoarseness - Primary    Referred to ENT for further evaluation.       Relevant Orders   Ambulatory referral to ENT      I am having Emily Mitchell maintain her Cholecalciferol, vitamin B-12, acetaminophen, FISH OIL-KRILL OIL PO, montelukast, aspirin EC, Dexlansoprazole, nitroGLYCERIN, clopidogrel, albuterol, ezetimibe, Fenofibric Acid, budesonide-formoterol, clonazePAM, losartan, FLUoxetine, gabapentin, and levothyroxine.  Meds ordered this encounter  Medications  . gabapentin (NEURONTIN) 100 MG capsule    Sig: Take 2 capsules (200 mg total) by mouth at bedtime.    Dispense:  180 capsule     Refill:  1    Please consider 90 day supplies to promote better adherence  . levothyroxine (SYNTHROID) 125 MCG tablet    Sig: TAKE 1 TABLET BY MOUTH ONCE DAILY BEFORE BREAKFAST    Dispense:  90 tablet    Refill:  1  I discussed the assessment and treatment plan with the patient. The patient was provided an opportunity to ask questions and all were answered. The patient agreed with the plan and demonstrated an understanding of the instructions.   The patient was advised to call back or seek an in-person evaluation if the symptoms worsen or if the condition fails to improve as anticipated.  I provided 25 minutes of non-face-to-face time during this encounter.   Penni Homans, MD

## 2019-03-09 NOTE — Assessment & Plan Note (Signed)
Supplement and monitor 

## 2019-03-09 NOTE — Assessment & Plan Note (Signed)
On Levothyroxine, continue to monitor 

## 2019-03-09 NOTE — Assessment & Plan Note (Signed)
Has stopped all supplements on the recommendation of nephrology.

## 2019-03-09 NOTE — Assessment & Plan Note (Signed)
Encouraged heart healthy diet, increase exercise, avoid trans fats, consider a krill oil cap daily 

## 2019-03-09 NOTE — Assessment & Plan Note (Signed)
Gabapentin qhs

## 2019-03-09 NOTE — Assessment & Plan Note (Signed)
hgba1c acceptable, minimize simple carbs. Increase exercise as tolerated.  

## 2019-03-09 NOTE — Assessment & Plan Note (Signed)
Hydrate and monitor 

## 2019-03-12 ENCOUNTER — Ambulatory Visit: Payer: Medicare HMO | Admitting: Cardiology

## 2019-03-15 ENCOUNTER — Other Ambulatory Visit: Payer: Self-pay

## 2019-03-15 ENCOUNTER — Emergency Department (HOSPITAL_BASED_OUTPATIENT_CLINIC_OR_DEPARTMENT_OTHER): Payer: Medicare HMO

## 2019-03-15 ENCOUNTER — Encounter (HOSPITAL_BASED_OUTPATIENT_CLINIC_OR_DEPARTMENT_OTHER): Payer: Self-pay | Admitting: Emergency Medicine

## 2019-03-15 ENCOUNTER — Emergency Department (HOSPITAL_BASED_OUTPATIENT_CLINIC_OR_DEPARTMENT_OTHER)
Admission: EM | Admit: 2019-03-15 | Discharge: 2019-03-15 | Disposition: A | Discharge status: Home / Self Care | Payer: Medicare HMO | Attending: Emergency Medicine | Admitting: Emergency Medicine

## 2019-03-15 DIAGNOSIS — I509 Heart failure, unspecified: Secondary | ICD-10-CM | POA: Diagnosis not present

## 2019-03-15 DIAGNOSIS — Z7982 Long term (current) use of aspirin: Secondary | ICD-10-CM | POA: Insufficient documentation

## 2019-03-15 DIAGNOSIS — Z9861 Coronary angioplasty status: Secondary | ICD-10-CM | POA: Insufficient documentation

## 2019-03-15 DIAGNOSIS — N184 Chronic kidney disease, stage 4 (severe): Secondary | ICD-10-CM | POA: Diagnosis not present

## 2019-03-15 DIAGNOSIS — Z7902 Long term (current) use of antithrombotics/antiplatelets: Secondary | ICD-10-CM | POA: Diagnosis not present

## 2019-03-15 DIAGNOSIS — I2510 Atherosclerotic heart disease of native coronary artery without angina pectoris: Secondary | ICD-10-CM | POA: Insufficient documentation

## 2019-03-15 DIAGNOSIS — I130 Hypertensive heart and chronic kidney disease with heart failure and stage 1 through stage 4 chronic kidney disease, or unspecified chronic kidney disease: Secondary | ICD-10-CM | POA: Insufficient documentation

## 2019-03-15 DIAGNOSIS — E785 Hyperlipidemia, unspecified: Secondary | ICD-10-CM | POA: Diagnosis not present

## 2019-03-15 DIAGNOSIS — Z87891 Personal history of nicotine dependence: Secondary | ICD-10-CM | POA: Insufficient documentation

## 2019-03-15 DIAGNOSIS — Z880 Allergy status to penicillin: Secondary | ICD-10-CM | POA: Insufficient documentation

## 2019-03-15 DIAGNOSIS — M62838 Other muscle spasm: Secondary | ICD-10-CM

## 2019-03-15 DIAGNOSIS — I10 Essential (primary) hypertension: Secondary | ICD-10-CM | POA: Diagnosis not present

## 2019-03-15 DIAGNOSIS — R0602 Shortness of breath: Secondary | ICD-10-CM | POA: Diagnosis not present

## 2019-03-15 DIAGNOSIS — Z79899 Other long term (current) drug therapy: Secondary | ICD-10-CM | POA: Insufficient documentation

## 2019-03-15 DIAGNOSIS — M25511 Pain in right shoulder: Secondary | ICD-10-CM

## 2019-03-15 DIAGNOSIS — J449 Chronic obstructive pulmonary disease, unspecified: Secondary | ICD-10-CM | POA: Insufficient documentation

## 2019-03-15 LAB — CBC WITH DIFFERENTIAL/PLATELET
Abs Immature Granulocytes: 0.02 10*3/uL (ref 0.00–0.07)
Basophils Absolute: 0 10*3/uL (ref 0.0–0.1)
Basophils Relative: 1 %
Eosinophils Absolute: 0.1 10*3/uL (ref 0.0–0.5)
Eosinophils Relative: 1 %
HCT: 44 % (ref 36.0–46.0)
Hemoglobin: 14.5 g/dL (ref 12.0–15.0)
Immature Granulocytes: 0 %
Lymphocytes Relative: 25 %
Lymphs Abs: 1.9 10*3/uL (ref 0.7–4.0)
MCH: 29.7 pg (ref 26.0–34.0)
MCHC: 33 g/dL (ref 30.0–36.0)
MCV: 90 fL (ref 80.0–100.0)
Monocytes Absolute: 0.6 10*3/uL (ref 0.1–1.0)
Monocytes Relative: 8 %
Neutro Abs: 5.2 10*3/uL (ref 1.7–7.7)
Neutrophils Relative %: 65 %
Platelets: 265 10*3/uL (ref 150–400)
RBC: 4.89 MIL/uL (ref 3.87–5.11)
RDW: 12.7 % (ref 11.5–15.5)
WBC: 7.9 10*3/uL (ref 4.0–10.5)
nRBC: 0 % (ref 0.0–0.2)

## 2019-03-15 LAB — COMPREHENSIVE METABOLIC PANEL
ALT: 20 U/L (ref 0–44)
AST: 27 U/L (ref 15–41)
Albumin: 4.3 g/dL (ref 3.5–5.0)
Alkaline Phosphatase: 56 U/L (ref 38–126)
Anion gap: 13 (ref 5–15)
BUN: 26 mg/dL — ABNORMAL HIGH (ref 8–23)
CO2: 23 mmol/L (ref 22–32)
Calcium: 10.2 mg/dL (ref 8.9–10.3)
Chloride: 104 mmol/L (ref 98–111)
Creatinine, Ser: 1.81 mg/dL — ABNORMAL HIGH (ref 0.44–1.00)
GFR calc Af Amer: 32 mL/min — ABNORMAL LOW (ref >60)
GFR calc non Af Amer: 28 mL/min — ABNORMAL LOW (ref >60)
Glucose, Bld: 90 mg/dL (ref 70–99)
Potassium: 3.8 mmol/L (ref 3.5–5.1)
Sodium: 140 mmol/L (ref 135–145)
Total Bilirubin: 0.6 mg/dL (ref 0.3–1.2)
Total Protein: 7.4 g/dL (ref 6.5–8.1)

## 2019-03-15 LAB — TROPONIN I (HIGH SENSITIVITY)
Troponin I (High Sensitivity): 10 ng/L (ref <18)
Troponin I (High Sensitivity): 9 ng/L (ref <18)

## 2019-03-15 LAB — MAGNESIUM: Magnesium: 2.1 mg/dL (ref 1.7–2.4)

## 2019-03-15 MED ORDER — MORPHINE SULFATE (PF) 4 MG/ML IV SOLN
4.0000 mg | Freq: Once | INTRAVENOUS | Status: AC
  Administered 2019-03-15: 4 mg via INTRAVENOUS
  Filled 2019-03-15: qty 1

## 2019-03-15 MED ORDER — CYCLOBENZAPRINE HCL 10 MG PO TABS: 10.0000 mg | ORAL_TABLET | Freq: Two times a day (BID) | ORAL | 0 refills | Status: DC | PRN

## 2019-03-15 MED ORDER — LIDOCAINE 5 % EX PTCH: 1.0000 | MEDICATED_PATCH | CUTANEOUS | 0 refills | Status: DC

## 2019-03-15 MED ORDER — CYCLOBENZAPRINE HCL 10 MG PO TABS
10.0000 mg | ORAL_TABLET | Freq: Once | ORAL | Status: AC
  Administered 2019-03-15: 10 mg via ORAL
  Filled 2019-03-15: qty 1

## 2019-03-15 NOTE — ED Notes (Signed)
Delay in EKG due to pt in xray 

## 2019-03-15 NOTE — ED Provider Notes (Signed)
[place] EMERGENCY DEPARTMENT Provider Note   CSN: [id number] Arrival date & time: 03/15/19  1959     History   Chief Complaint Chief Complaint  Patient presents with   Shoulder Pain    HPI Emily Mitchell is a 73 y.o. female.     The history is provided by the patient, a relative and medical records. No language interpreter was used.  Shoulder Pain Location:  Shoulder and arm Shoulder location:  R shoulder Arm location:  R upper arm Injury: no   Pain details:    Quality:  Cramping, aching and sharp   Radiates to:  R shoulder   Severity:  Severe   Onset quality:  Gradual   Duration:  1 week   Timing:  Constant   Progression:  Waxing and waning Handedness:  Right-handed Dislocation: no   Foreign body present:  Unable to specify Prior injury to area:  No Relieved by:  Elevation, immobilization and being still Worsened by:  Movement and stretching area Ineffective treatments:  None tried Associated symptoms: back pain (r scapula)   Associated symptoms: no fatigue, no fever, no muscle weakness, no neck pain, no numbness, no stiffness, no swelling and no tingling     Past Medical History:  Diagnosis Date   Anemia 07/05/2014   Anxiety and depression 12/14/2008   Qualifier: Diagnosis of  By: [name] LPN, Almyra Free     Arthritis    "fingers, toes, knees, joints" (07/18/2018)   Asthma    Atypical chest pain 05/23/2015   CAD (coronary artery disease)    a. [place] with cath 06/2018 s/p DES to LAD.   Carotid artery occlusion    a. R carotid occluded, 73-53% LICA.   CHF (congestive heart failure) (HCC)    Chicken pox as a child   Chronic kidney disease    Bright's Disease at age 11    COPD (chronic obstructive pulmonary disease) (HCC)    GERD (gastroesophageal reflux disease)    H. pylori infection 10/19/2013   Hepatitis 1970s   "don't know for sure which one it was; think it was A" (07/18/2018)   Hyperglycemia 03/14/2016   Hyperlipidemia     Hypertension    Hypothyroidism    Knee pain, bilateral 12/17/2012   Left hip pain 05/23/2015   Low back pain 09/19/2015   Macular degeneration of both eyes 12/16/2015   Right eye is wet Left Eye is dry Shots every 11 to 12 weeks in right eye at opthamologist office   Medicare annual wellness visit, subsequent 02/21/2015   Mumps 73 yrs old   Pneumonia    "at least once" (07/18/2018)   Preventative health care 03/14/2016   Pulmonary emboli ([place]) 05/26/2013   Sleep apnea    "don't use CPAP anymore; dr said I didn't have to" (07/18/2018)   Stroke [facility])    "been told I've had some strokes; didn't know I'd had them" (07/18/2018)   Tremor    UTI (lower urinary tract infection) 10/19/2013    Patient Active Problem List   Diagnosis Date Noted   Hypercalcemia 03/09/2019   Hoarseness 03/09/2019   Back pain 02/02/2019   History of food allergy 08/14/2018   CAD (coronary artery disease) 07/25/2018   CAD in native artery 07/19/2018   Chest pain 07/18/2018   History of pulmonary embolism 07/17/2018   Renal insufficiency 07/17/2018   H/O multiple allergies 07/14/2018   Chronic rhinitis 06/14/2018   Vitamin D deficiency 03/03/2018   Candidal skin infection 02/28/2018  COPD with asthma ([place]) 11/06/2017   Lymphadenopathy 10/18/2017   Right shoulder pain 10/18/2017   Neck pain 04/22/2017   Hyperglycemia 03/14/2016   Preventative health care 03/14/2016   Macular degeneration of both eyes 12/16/2015   Essential hypertension 10/18/2015   Exudative age-related macular degeneration of right eye with active choroidal neovascularization ([place]) 08/26/2015   Carotid artery disease ([place]) 06/16/2015   Left hip pain 05/23/2015   Medicare annual wellness visit, subsequent 02/21/2015   Anemia 07/05/2014   Essential and other specified forms of tremor 11/05/2013   Lower urinary tract infectious disease 10/19/2013   Hyperlipidemia, mixed 05/26/2013   Pulmonary  emboli ([place]) 05/26/2013   Arthritis    CKD (chronic kidney disease), stage IV (HCC)    Hypothyroidism    CHF (congestive heart failure) ([place])    Occlusion and stenosis of carotid artery without mention of cerebral infarction 12/17/2012   Knee pain, bilateral 12/17/2012   Numbness of finger 12/17/2012   Constipation 12/17/2008   Anxiety and depression 12/14/2008   OSA (obstructive sleep apnea) 12/14/2008    Past Surgical History:  Procedure Laterality Date   APPENDECTOMY  1984   CARDIAC CATHETERIZATION  ~ 2006   CAROTID ANGIOGRAPHY Left 08/20/2018   Procedure: CAROTID ANGIOGRAPHY;  Surgeon: Serafina [place], MD;  Location: [place] [facility];  Service: Cardiovascular;  Laterality: Left;   CAROTID ENDARTERECTOMY Right 05/10/07   cea   CATARACT EXTRACTION Left    CATARACT EXTRACTION W/ INTRAOCULAR LENS  IMPLANT, BILATERAL Bilateral    CORONARY ANGIOPLASTY WITH STENT PLACEMENT  07/18/2018   CORONARY STENT INTERVENTION N/A 07/18/2018   Procedure: CORONARY STENT INTERVENTION;  Surgeon: Lorretta Harp, MD;  Location: [place] [facility];  Service: Cardiovascular;  Laterality: N/A;   INTRAVASCULAR PRESSURE WIRE/FFR STUDY N/A 07/18/2018   Procedure: INTRAVASCULAR PRESSURE WIRE/FFR STUDY;  Surgeon: Lorretta Harp, MD;  Location: [place] [facility];  Service: Cardiovascular;  Laterality: N/A;   JOINT REPLACEMENT     LEFT HEART CATH AND CORONARY ANGIOGRAPHY N/A 07/18/2018   Procedure: LEFT HEART CATH AND CORONARY ANGIOGRAPHY;  Surgeon: Lorretta Harp, MD;  Location: [place] [facility];  Service: Cardiovascular;  Laterality: N/A;   MULTIPLE TOOTH EXTRACTIONS     TOTAL KNEE ARTHROPLASTY Bilateral 2005 - 2011   right - left   VAGINAL HYSTERECTOMY  1984   WISDOM TOOTH EXTRACTION       OB History   No obstetric history on file.      Home Medications    Prior to Admission medications   Medication Sig Start Date End Date Taking? Authorizing Provider    acetaminophen (TYLENOL) 500 MG tablet Take 500 mg by mouth every 6 (six) hours as needed for moderate pain.     [provider]  albuterol (PROVENTIL HFA;VENTOLIN HFA) 108 (90 Base) MCG/ACT inhaler Inhale 2 puffs into the lungs every 6 (six) hours as needed for wheezing or shortness of breath.    [provider]  aspirin EC 81 MG tablet Take 1 tablet (81 mg total) by mouth daily. 07/17/18   Revankar, Reita Cliche, MD  budesonide-formoterol (SYMBICORT) 80-4.5 MCG/ACT inhaler Inhale 2 puffs into the lungs 2 (two) times daily. 01/06/19   Rigoberto Noel, MD  Cholecalciferol (D3 MAXIMUM STRENGTH) 5000 UNITS capsule Take 5,000 Units by mouth daily.    [provider]  Choline Fenofibrate (FENOFIBRIC ACID) 135 MG CPDR Take 1 capsule by mouth daily. 12/11/18   Mosie Lukes, MD  clonazePAM Bobbye [place])  1 MG tablet Take 1 tablet (1 mg total) by mouth 3 (three) times daily as needed. for anxiety 01/30/19   Mosie Lukes, MD  clopidogrel (PLAVIX) 75 MG tablet Take 1 tablet (75 mg total) by mouth daily. 08/09/18   Mosie Lukes, MD  Dexlansoprazole (DEXILANT) 30 MG capsule Take 1 capsule (30 mg total) by mouth daily. Talk to primary care doctor about continuation of medicine. 07/19/18   Dunn, Nedra Hai, PA-C  ezetimibe (ZETIA) 10 MG tablet Take 1 tablet (10 mg total) by mouth daily. 11/26/18   Mosie Lukes, MD  FISH OIL-KRILL OIL PO Take 1,000 mg by mouth daily.     [provider]  FLUoxetine (PROZAC) 20 MG tablet Take 1 tablet (20 mg total) by mouth daily. 02/17/19   Mosie Lukes, MD  gabapentin (NEURONTIN) 100 MG capsule Take 2 capsules (200 mg total) by mouth at bedtime. 03/06/19   Mosie Lukes, MD  levothyroxine (SYNTHROID) 125 MCG tablet TAKE 1 TABLET BY MOUTH ONCE DAILY BEFORE BREAKFAST 03/06/19   Mosie Lukes, MD  losartan (COZAAR) 25 MG tablet Take 1 tablet (25 mg total) by mouth daily. 01/30/19   Mosie Lukes, MD  montelukast (SINGULAIR) 10 MG tablet Take 1 tablet (10  mg total) by mouth at bedtime as needed. 05/22/18   Mosie Lukes, MD  nitroGLYCERIN (NITROSTAT) 0.4 MG SL tablet Place 1 tablet (0.4 mg total) under the tongue every 5 (five) minutes as needed for chest pain. 07/25/18 10/23/18  Revankar, Reita Cliche, MD  vitamin B-12 (CYANOCOBALAMIN) 500 MCG tablet Take 500 mcg by mouth daily.     [provider]    Family History Family History  Problem Relation Age of Onset   Stroke Mother        mini stroke   Kidney disease Mother    Heart failure Mother    Hypertension Mother    Diabetes Sister        type 2   Kidney disease Sister    Allergic rhinitis Sister    Heart attack Maternal Grandfather    Hyperlipidemia Sister     Social History Social History   Tobacco Use   Smoking status: Former Smoker    Packs/day: 2.00    Years: 30.00    Pack years: 60.00    Types: Cigarettes    Quit date: 12/03/1994    Years since quitting: 24.2   Smokeless tobacco: Never Used  Substance Use Topics   Alcohol use: Yes    Comment: 07/18/2018 "1 drink q 1-2 months; if that"   Drug use: Never     Allergies   Niaspan [niacin er], Pantoprazole, Bupropion, Nitroglycerin, Penicillins, Statins, Sulfonamide derivatives, Apple, and Banana   Review of Systems Review of Systems  Constitutional: Negative for chills, diaphoresis, fatigue and fever.  HENT: Negative for congestion.   Eyes: Negative for visual disturbance.  Respiratory: Negative for cough, chest tightness, shortness of breath and wheezing.   Cardiovascular: Negative for chest pain, palpitations and leg swelling.  Gastrointestinal: Negative for abdominal pain, constipation, diarrhea, nausea and vomiting.  Genitourinary: Negative for dysuria and flank pain.  Musculoskeletal: Positive for back pain (r scapula). Negative for neck pain, neck stiffness and stiffness.  Skin: Negative for rash and wound.  Neurological: Negative for dizziness, light-headedness and headaches.    Psychiatric/Behavioral: Negative for agitation.  All other systems reviewed and are negative.    Physical Exam Updated Vital Signs BP (!) 154/89  Pulse 88    Temp 98.9 F (37.2 C) (Oral)    Resp 18    Ht 5\' 3"  (1.6 m)    Wt 83 kg    SpO2 100%    BMI 32.42 kg/m   Physical Exam Vitals signs and nursing note reviewed.  Constitutional:      General: She is not in acute distress.    Appearance: She is well-developed. She is not ill-appearing, toxic-appearing or diaphoretic.  HENT:     Head: Normocephalic and atraumatic.     Right Ear: External ear normal.     Left Ear: External ear normal.     Nose: Nose normal. No congestion or rhinorrhea.     Mouth/Throat:     Mouth: Mucous membranes are moist.     Pharynx: No oropharyngeal exudate.  Eyes:     Conjunctiva/sclera: Conjunctivae normal.     Pupils: Pupils are equal, round, and reactive to light.  Neck:     Musculoskeletal: Normal range of motion and neck supple. No muscular tenderness.  Cardiovascular:     Rate and Rhythm: Normal rate.     Pulses: Normal pulses.     Heart sounds: No murmur.  Pulmonary:     Effort: No respiratory distress.     Breath sounds: No stridor. No wheezing, rhonchi or rales.  Chest:     Chest wall: No tenderness.  Abdominal:     General: Abdomen is flat. There is no distension.     Tenderness: There is no abdominal tenderness. There is no right CVA tenderness, left CVA tenderness or rebound.  Musculoskeletal:        General: Tenderness present.     Right shoulder: She exhibits tenderness, bony tenderness, pain and spasm. She exhibits no swelling, no crepitus, no deformity, no laceration, normal pulse and normal strength.       Arms:     Right lower leg: No edema.     Left lower leg: No edema.     Comments: Normal sensation and strength in right upper extremity.  Normal pulse.  Patient has tenderness and palpable muscle spasms in the right shoulder and right scapular area.  No midline back  tenderness.  No laceration or skin change.  Skin:    General: Skin is warm.     Capillary Refill: Capillary refill takes less than 2 seconds.     Coloration: Skin is not pale.     Findings: No erythema or rash.  Neurological:     General: No focal deficit present.     Mental Status: She is alert and oriented to person, place, and time.     Motor: No abnormal muscle tone.     Coordination: Coordination normal.     Deep Tendon Reflexes: Reflexes are normal and symmetric.  Psychiatric:        Mood and Affect: Mood normal.      ED Treatments / Results  Labs (all labs ordered are listed, but only abnormal results are displayed) Labs Reviewed  COMPREHENSIVE METABOLIC PANEL - Abnormal; Notable for the following components:      Result Value   BUN 26 (*)    Creatinine, Ser 1.81 (*)    GFR calc non Af Amer 28 (*)    GFR calc Af Amer 32 (*)    All other components within normal limits  CBC WITH DIFFERENTIAL/PLATELET  MAGNESIUM  TROPONIN I (HIGH SENSITIVITY)  TROPONIN I (HIGH SENSITIVITY)    EKG EKG Interpretation  Date/Time:  Saturday  March 15 2019 20:53:31 EDT Ventricular Rate:  67 PR Interval:    QRS Duration: 79 QT Interval:  406 QTC Calculation: 429 R Axis:   36 Text Interpretation:  Sinus rhythm Borderline prolonged PR interval Low voltage, precordial leads Abnormal R-wave progression, early transition Baseline wander in lead(s) V1 When compared to prior, no significant changes seen.  No STEMI Confirmed by Antony Blackbird [phone]) on 03/15/2019 10:56:28 PM   Radiology Dg Chest 2 View  Result Date: 03/15/2019 CLINICAL DATA:  Shortness of breath today. EXAM: CHEST - 2 VIEW COMPARISON:  Radiograph 09/04/2018 FINDINGS: The cardiomediastinal contours are unchanged. Aortic atherosclerosis. Mild chronic bronchial thickening/interstitial coarsening. Pulmonary vasculature is normal. No consolidation, pleural effusion, or pneumothorax. Stable coarse calcifications projecting over  the left breast. Degenerative change in the spine. No acute osseous abnormalities are seen. IMPRESSION: 1. No acute chest findings. 2.  Aortic Atherosclerosis (ICD10-I70.0). Electronically Signed   By: Keith Rake M.D.   On: 03/15/2019 20:57   Dg Shoulder Right  Result Date: 03/15/2019 CLINICAL DATA:  Right shoulder pain. EXAM: RIGHT SHOULDER - 2+ VIEW COMPARISON:  None. FINDINGS: There is no evidence of fracture or dislocation. There is no evidence of arthropathy or other focal bone abnormality. Soft tissues are unremarkable. IMPRESSION: Negative. Electronically Signed   By: Constance Holster M.D.   On: 03/15/2019 20:52    Procedures Procedures (including critical care time)  Medications Ordered in ED Medications  morphine 4 MG/ML injection 4 mg (4 mg Intravenous Given 03/15/19 2047)  cyclobenzaprine (FLEXERIL) tablet 10 mg (10 mg Oral Given 03/15/19 2311)     Initial Impression / Assessment and Plan / ED Course  I have reviewed the triage vital signs and the nursing notes.  Pertinent labs & imaging results that were available during my care of the patient were reviewed by me and considered in my medical decision making (see chart for details).        Emily Mitchell is a 73 y.o. female with a complicated past medical history significant for CAD with PCI, CHF, COPD, PE, stroke, CKD, hypertension, hyperlipidemia, and GERD who presents with right scapula and right shoulder pain.  Patient reports that for the last week she has had pain in her right shoulder going into her right upper arm.  She has never had this pain before.  She reports this feels nothing like when she has had blood clots or cardiac problems.  She denies any chest pain or shortness of breath.  She reports that the pain is worsened when she moves her shoulder around and has to hold her shoulder in certain positions to help with the discomfort.  She reports as a 10 out of 10 in severity.  She reports muscle spasms and  shooting pains.  She denies any pain in her neck or mid back.  She denies any specific trauma when this started but movement exacerbates it.  She denies new leg pain or leg swelling.  She denies any fevers, chills, congestion, cough, urinary symptoms or GI symptoms.  No diaphoresis, nausea, or other complaints.  She is right-handed.  On exam, lungs are clear and chest is nontender.  No murmur.  Abdomen is nontender.  Legs are nonedematous and nontender.  Patient has normal strength and sensation in extremities.  Normal pulses in upper extremities.  Patient had pain with upper arm palpation and manipulation.  Patient had palpable muscle spasms across her right upper back near the scapula and shoulder joint.  No midline or  neck tenderness.  Had a shared decision-making conversation with patient and son about work-up.  Given patient's extensive medical history including cardiac and pulmonary problems, we agreed to get a chest x-ray, screening labs, and an EKG.  I have a high suspicion this is more musculoskeletal given the quality of discomfort and the palpable muscle spasms on exam.  We agreed to get imaging of the right shoulder and get screening electrolytes as she reports he is had some electrolyte imbalance in the past which may have contributed to muscle spasms.  We will give the patient pain medicine initially.  If work-up is reassuring, anticipate discharge with either pain medicine or muscle relaxant or Lidoderm patch to help with her discomfort.  Low suspicion for a concerning etiology for symptoms at this time.     Delta troponin was negative.  Work-up overall reassuring.  X-ray showed no acute abnormality.  Patient reports pain felt better with medications.  Suspect continued musculoskeletal spasm and pain.  Had a shared decision making conversation with patient and family, we agreed to provide patient with prescription for Lidoderm patch as well as muscle relaxant.  Patient given a dose here in  the emergency department.  Patient will follow-up with PCP.  Patient understood follow-up instructions and return precautions.  She and family had no other questions or concerns and was discharged in good condition with improved symptoms.    Final Clinical Impressions(s) / ED Diagnoses   Final diagnoses:  Muscle spasm  Acute pain of right shoulder    ED Discharge Orders         Ordered    cyclobenzaprine (FLEXERIL) 10 MG tablet  2 times daily PRN     03/15/19 2326    lidocaine (LIDODERM) 5 %  Every 24 hours     03/15/19 2326          Clinical Impression: 1. Muscle spasm   2. Acute pain of right shoulder     Disposition: Discharge  Condition: Good  I have discussed the results, Dx and Tx plan with the pt(& family if present). He/she/they expressed understanding and agree(s) with the plan. Discharge instructions discussed at great length. Strict return precautions discussed and pt &/or family have verbalized understanding of the instructions. No further questions at time of discharge.    Discharge Medication List as of 03/15/2019 11:27 PM    START taking these medications   Details  cyclobenzaprine (FLEXERIL) 10 MG tablet Take 1 tablet (10 mg total) by mouth 2 (two) times daily as needed., Starting Sat 03/15/2019, Print    lidocaine (LIDODERM) 5 % Place 1 patch onto the skin daily. Remove & Discard patch within 12 hours or as directed by MD, Starting Sat 03/15/2019, Print        Follow Up: Mosie Lukes, MD [place] [address] [place] [address] [phone]     [place] [address] [id number] [place]       Eason Housman, Gwenyth Allegra, MD 03/15/19 [phone]

## 2019-03-15 NOTE — ED Notes (Signed)
ED Provider at bedside. 

## 2019-03-15 NOTE — Discharge Instructions (Signed)
Your work-up today was overall reassuring.  Your cardiac enzymes were negative both times we checked them, we have a low suspicion there is a cardiac or lung cause of your discomfort.  On her exam, you are having palpable muscle spasms and tenderness that was worsened with shoulder movement.  Please use the Lidoderm patch as well as the muscle relaxant to help with your symptoms and follow-up with your primary doctor to discuss further management.  If any symptoms change or worsen, please return to the nearest emergency department.

## 2019-03-15 NOTE — ED Triage Notes (Signed)
Pt states that last Saturday she noticed the pain in her shoulder. It has been getting worse. The pain is shooting down her shoulder into her arm. States hx of blood clot in 2015. Denies injury to shoulder or neck

## 2019-03-17 ENCOUNTER — Telehealth: Payer: Self-pay

## 2019-03-17 NOTE — Telephone Encounter (Signed)
Copied from [place] [phone]. Topic: General - Other >> Mar 17, 2019  3:36 PM Virl Axe D wrote: Reason for CRM: Pt wanted Dr. Charlett Blake to know she went to the ED on 03/15/19 for muscle spasms. Pt is doing much better today. She will schedule an appt if she feels she gets worse.

## 2019-04-08 DIAGNOSIS — R490 Dysphonia: Secondary | ICD-10-CM | POA: Diagnosis not present

## 2019-04-08 DIAGNOSIS — R09A2 Foreign body sensation, throat: Secondary | ICD-10-CM

## 2019-04-08 DIAGNOSIS — K219 Gastro-esophageal reflux disease without esophagitis: Secondary | ICD-10-CM | POA: Diagnosis not present

## 2019-04-08 DIAGNOSIS — R0989 Other specified symptoms and signs involving the circulatory and respiratory systems: Secondary | ICD-10-CM

## 2019-04-08 HISTORY — DX: Other specified symptoms and signs involving the circulatory and respiratory systems: R09.89

## 2019-04-08 HISTORY — DX: Foreign body sensation, throat: R09.A2

## 2019-04-11 DIAGNOSIS — R928 Other abnormal and inconclusive findings on diagnostic imaging of breast: Secondary | ICD-10-CM | POA: Diagnosis not present

## 2019-04-11 DIAGNOSIS — N6489 Other specified disorders of breast: Secondary | ICD-10-CM | POA: Diagnosis not present

## 2019-04-11 LAB — HM MAMMOGRAPHY

## 2019-04-17 ENCOUNTER — Ambulatory Visit (INDEPENDENT_AMBULATORY_CARE_PROVIDER_SITE_OTHER): Payer: Medicare HMO | Admitting: *Deleted

## 2019-04-17 ENCOUNTER — Other Ambulatory Visit: Payer: Self-pay

## 2019-04-17 DIAGNOSIS — Z23 Encounter for immunization: Secondary | ICD-10-CM | POA: Diagnosis not present

## 2019-04-17 NOTE — Progress Notes (Signed)
Patient here today for flu shot and pneumonia vaccine.  Vaccines given and patient tolerated well.

## 2019-04-21 ENCOUNTER — Ambulatory Visit: Payer: Medicare HMO | Admitting: Cardiology

## 2019-04-21 DIAGNOSIS — N184 Chronic kidney disease, stage 4 (severe): Secondary | ICD-10-CM | POA: Diagnosis not present

## 2019-04-22 DIAGNOSIS — H3554 Dystrophies primarily involving the retinal pigment epithelium: Secondary | ICD-10-CM | POA: Diagnosis not present

## 2019-04-22 DIAGNOSIS — H353211 Exudative age-related macular degeneration, right eye, with active choroidal neovascularization: Secondary | ICD-10-CM | POA: Diagnosis not present

## 2019-04-22 DIAGNOSIS — H35363 Drusen (degenerative) of macula, bilateral: Secondary | ICD-10-CM | POA: Diagnosis not present

## 2019-04-22 DIAGNOSIS — H353121 Nonexudative age-related macular degeneration, left eye, early dry stage: Secondary | ICD-10-CM | POA: Diagnosis not present

## 2019-04-28 ENCOUNTER — Ambulatory Visit (INDEPENDENT_AMBULATORY_CARE_PROVIDER_SITE_OTHER): Payer: Medicare HMO | Admitting: Family Medicine

## 2019-04-28 ENCOUNTER — Other Ambulatory Visit: Payer: Self-pay

## 2019-04-28 DIAGNOSIS — R490 Dysphonia: Secondary | ICD-10-CM | POA: Diagnosis not present

## 2019-04-28 DIAGNOSIS — N6320 Unspecified lump in the left breast, unspecified quadrant: Secondary | ICD-10-CM | POA: Insufficient documentation

## 2019-04-28 DIAGNOSIS — N184 Chronic kidney disease, stage 4 (severe): Secondary | ICD-10-CM

## 2019-04-28 DIAGNOSIS — E785 Hyperlipidemia, unspecified: Secondary | ICD-10-CM | POA: Diagnosis not present

## 2019-04-28 DIAGNOSIS — E782 Mixed hyperlipidemia: Secondary | ICD-10-CM

## 2019-04-28 DIAGNOSIS — M25511 Pain in right shoulder: Secondary | ICD-10-CM

## 2019-04-28 DIAGNOSIS — R739 Hyperglycemia, unspecified: Secondary | ICD-10-CM

## 2019-04-28 DIAGNOSIS — I10 Essential (primary) hypertension: Secondary | ICD-10-CM | POA: Diagnosis not present

## 2019-04-28 HISTORY — DX: Unspecified lump in the left breast, unspecified quadrant: N63.20

## 2019-04-28 MED ORDER — CLONAZEPAM 1 MG PO TABS: 1.0000 mg | ORAL_TABLET | Freq: Three times a day (TID) | ORAL | 1 refills | Status: DC | PRN

## 2019-04-28 MED ORDER — CYCLOBENZAPRINE HCL 10 MG PO TABS: 10.0000 mg | ORAL_TABLET | Freq: Two times a day (BID) | ORAL | 0 refills | Status: DC | PRN

## 2019-04-28 MED ORDER — LOSARTAN POTASSIUM 25 MG PO TABS: 25.0000 mg | ORAL_TABLET | Freq: Every day | ORAL | 1 refills | Status: DC

## 2019-04-28 MED ORDER — CLOPIDOGREL BISULFATE 75 MG PO TABS: 75.0000 mg | ORAL_TABLET | Freq: Every day | ORAL | 6 refills | Status: DC

## 2019-04-28 MED ORDER — EZETIMIBE 10 MG PO TABS: 10.0000 mg | ORAL_TABLET | Freq: Every day | ORAL | 1 refills | Status: DC

## 2019-04-28 MED ORDER — MONTELUKAST SODIUM 10 MG PO TABS: 10.0000 mg | ORAL_TABLET | Freq: Every evening | ORAL | 3 refills | Status: DC | PRN

## 2019-04-28 MED ORDER — FAMOTIDINE 40 MG PO TABS: 40.0000 mg | ORAL_TABLET | Freq: Every day | ORAL | 1 refills | Status: DC

## 2019-04-28 MED ORDER — FLUOXETINE HCL 20 MG PO TABS: 20.0000 mg | ORAL_TABLET | Freq: Every day | ORAL | 3 refills | Status: DC

## 2019-04-28 NOTE — Assessment & Plan Note (Addendum)
Was seen in ER in mid August for severe pain, she denies any fall or trauma, they added Flexeril and that has been helpful so refill given today. Report any worsening for evaluaiton

## 2019-04-28 NOTE — Assessment & Plan Note (Signed)
She has been seen by ENT and the have scoped her and started her on Famotidine 40 mg qhs to see if this helps, she also notes some occasional LUQ pain which this might also help with if worsens she is to notify us

## 2019-04-28 NOTE — Patient Instructions (Signed)
Shoulder Pain Many things can cause shoulder pain, including:  An injury to the shoulder.  Overuse of the shoulder.  Arthritis. The source of the pain can be:  Inflammation.  An injury to the shoulder joint.  An injury to a tendon, ligament, or bone. Follow these instructions at home: Pay attention to changes in your symptoms. Let your health care provider know about them. Follow these instructions to relieve your pain. If you have a sling:  Wear the sling as told by your health care provider. Remove it only as told by your health care provider.  Loosen the sling if your fingers tingle, become numb, or turn cold and blue.  Keep the sling clean.  If the sling is not waterproof: ? Do not let it get wet. Remove it to shower or bathe.  Move your arm as little as possible, but keep your hand moving to prevent swelling. Managing pain, stiffness, and swelling   If directed, put ice on the painful area: ? Put ice in a plastic bag. ? Place a towel between your skin and the bag. ? Leave the ice on for 20 minutes, 2-3 times per day. Stop applying ice if it does not help with the pain.  Squeeze a soft ball or a foam pad as much as possible. This helps to keep the shoulder from swelling. It also helps to strengthen the arm. General instructions  Take over-the-counter and prescription medicines only as told by your health care provider.  Keep all follow-up visits as told by your health care provider. This is important. Contact a health care provider if:  Your pain gets worse.  Your pain is not relieved with medicines.  New pain develops in your arm, hand, or fingers. Get help right away if:  Your arm, hand, or fingers: ? Tingle. ? Become numb. ? Become swollen. ? Become painful. ? Turn white or blue. Summary  Shoulder pain can be caused by an injury, overuse, or arthritis.  Pay attention to changes in your symptoms. Let your health care provider know about them.   This condition may be treated with a sling, ice, and pain medicines.  Contact your health care provider if the pain gets worse or new pain develops. Get help right away if your arm, hand, or fingers tingle or become numb, swollen, or painful.  Keep all follow-up visits as told by your health care provider. This is important. This information is not intended to replace advice given to you by your health care provider. Make sure you discuss any questions you have with your health care provider. Document Released: 04/26/2005 Document Revised: 01/29/2018 Document Reviewed: 01/29/2018 Elsevier Patient Education  2020 [employer]  

## 2019-04-28 NOTE — Assessment & Plan Note (Signed)
Well controlled, no changes to meds. Encouraged heart healthy diet such as the DASH diet and exercise as tolerated.  °

## 2019-04-28 NOTE — Progress Notes (Signed)
Subjective:    Patient ID: Emily Mitchell, female    DOB: [birth year], 73 y.o.   MRN: [id number]  No chief complaint on file.   HPI Patient is in today for follow up on chronic medical concerns including hyperglycemia, depression, hyperlipidemia and more. She is feeling well today. No recent febrile illness or hospitalizations. No polyuria or polydipsia. She had a mammogram at [facility] earlier this month and they spotted what they thinks is necrotic fatty tissue in left breast. They reequest follow up imaging in 6 months. Denies CP/palp/SOB/HA/congestion/fevers/GI or GU c/o. Taking meds as prescribed  Past Medical History:  Diagnosis Date  . Anemia 07/05/2014  . Anxiety and depression 12/14/2008   Qualifier: Diagnosis of  By: [name] LPN, Almyra Free    . Arthritis    "fingers, toes, knees, joints" (07/18/2018)  . Asthma   . Atypical chest pain 05/23/2015  . CAD (coronary artery disease)    a. [place] with cath 06/2018 s/p DES to LAD.  Marland Kitchen Carotid artery occlusion    a. R carotid occluded, 28-31% LICA.  Marland Kitchen CHF (congestive heart failure) ([place])   . Chicken pox as a child  . Chronic kidney disease    Bright's Disease at age 69   . COPD (chronic obstructive pulmonary disease) ([place])   . GERD (gastroesophageal reflux disease)   . H. pylori infection 10/19/2013  . Hepatitis 1970s   "don't know for sure which one it was; think it was A" (07/18/2018)  . Hyperglycemia 03/14/2016  . Hyperlipidemia   . Hypertension   . Hypothyroidism   . Knee pain, bilateral 12/17/2012  . Left hip pain 05/23/2015  . Low back pain 09/19/2015  . Macular degeneration of both eyes 12/16/2015   Right eye is wet Left Eye is dry Shots every 11 to 12 weeks in right eye at opthamologist office  . Medicare annual wellness visit, subsequent 02/21/2015  . Mumps 73 yrs old  . Pneumonia    "at least once" (07/18/2018)  . Preventative health care 03/14/2016  . Pulmonary emboli ([place]) 05/26/2013  . Sleep apnea    "don't use CPAP anymore; dr  said I didn't have to" (07/18/2018)  . Stroke [facility])    "been told I've had some strokes; didn't know I'd had them" (07/18/2018)  . Tremor   . UTI (lower urinary tract infection) 10/19/2013    Past Surgical History:  Procedure Laterality Date  . APPENDECTOMY  1984  . CARDIAC CATHETERIZATION  ~ 2006  . CAROTID ANGIOGRAPHY Left 08/20/2018   Procedure: CAROTID ANGIOGRAPHY;  Surgeon: Serafina Mitchell, MD;  Location: [facility];  Service: Cardiovascular;  Laterality: Left;  . CAROTID ENDARTERECTOMY Right 05/10/07   cea  . CATARACT EXTRACTION Left   . CATARACT EXTRACTION W/ INTRAOCULAR LENS  IMPLANT, BILATERAL Bilateral   . CORONARY ANGIOPLASTY WITH STENT PLACEMENT  07/18/2018  . CORONARY STENT INTERVENTION N/A 07/18/2018   Procedure: CORONARY STENT INTERVENTION;  Surgeon: Lorretta Harp, MD;  Location: [place] [facility];  Service: Cardiovascular;  Laterality: N/A;  . INTRAVASCULAR PRESSURE WIRE/FFR STUDY N/A 07/18/2018   Procedure: INTRAVASCULAR PRESSURE WIRE/FFR STUDY;  Surgeon: Lorretta Harp, MD;  Location: [place];  Service: Cardiovascular;  Laterality: N/A;  . JOINT REPLACEMENT    . LEFT HEART CATH AND CORONARY ANGIOGRAPHY N/A 07/18/2018   Procedure: LEFT HEART CATH AND CORONARY ANGIOGRAPHY;  Surgeon: Lorretta Harp, MD;  Location: [place] [facility];  Service: Cardiovascular;  Laterality: N/A;  .  MULTIPLE TOOTH EXTRACTIONS    . TOTAL KNEE ARTHROPLASTY Bilateral 2005 - 2011   right - left  . VAGINAL HYSTERECTOMY  1984  . WISDOM TOOTH EXTRACTION      Family History  Problem Relation Age of Onset  . Stroke Mother        mini stroke  . Kidney disease Mother   . Heart failure Mother   . Hypertension Mother   . Diabetes Sister        type 2  . Kidney disease Sister   . Allergic rhinitis Sister   . Heart attack Maternal Grandfather   . Hyperlipidemia Sister     Social History   Socioeconomic History  . Marital status: Divorced    Spouse name: Not  on file  . Number of children: 2  . Years of education: Not on file  . Highest education level: Not on file  Occupational History  . Not on file  Social Needs  . Financial resource strain: Not on file  . Food insecurity    Worry: Not on file    Inability: Not on file  . Transportation needs    Medical: Not on file    Non-medical: Not on file  Tobacco Use  . Smoking status: Former Smoker    Packs/day: 2.00    Years: 30.00    Pack years: 60.00    Types: Cigarettes    Quit date: 12/03/1994    Years since quitting: 24.4  . Smokeless tobacco: Never Used  Substance and Sexual Activity  . Alcohol use: Yes    Comment: 07/18/2018 "1 drink q 1-2 months; if that"  . Drug use: Never  . Sexual activity: Not Currently    Comment: lives with son and friend, no dietary restrictions  Lifestyle  . Physical activity    Days per week: Not on file    Minutes per session: Not on file  . Stress: Not on file  Relationships  . Social Herbalist on phone: Not on file    Gets together: Not on file    Attends religious service: Not on file    Active member of club or organization: Not on file    Attends meetings of clubs or organizations: Not on file    Relationship status: Not on file  . Intimate partner violence    Fear of current or ex partner: Not on file    Emotionally abused: Not on file    Physically abused: Not on file    Forced sexual activity: Not on file  Other Topics Concern  . Not on file  Social History Narrative   epworth sleepiness scale = 11 (06/15/2015)    Outpatient Medications Prior to Visit  Medication Sig Dispense Refill  . acetaminophen (TYLENOL) 500 MG tablet Take 500 mg by mouth every 6 (six) hours as needed for moderate pain.     Marland Kitchen albuterol (PROVENTIL HFA;VENTOLIN HFA) 108 (90 Base) MCG/ACT inhaler Inhale 2 puffs into the lungs every 6 (six) hours as needed for wheezing or shortness of breath.    Marland Kitchen aspirin EC 81 MG tablet Take 1 tablet (81 mg total) by  mouth daily. 90 tablet 3  . budesonide-formoterol (SYMBICORT) 80-4.5 MCG/ACT inhaler Inhale 2 puffs into the lungs 2 (two) times daily. 1 Inhaler 5  . Cholecalciferol (D3 MAXIMUM STRENGTH) 5000 UNITS capsule Take 5,000 Units by mouth daily.    . Choline Fenofibrate (FENOFIBRIC ACID) 135 MG CPDR Take 1 capsule by mouth  daily. 90 capsule 1  . FISH OIL-KRILL OIL PO Take 1,000 mg by mouth daily.     Marland Kitchen gabapentin (NEURONTIN) 100 MG capsule Take 2 capsules (200 mg total) by mouth at bedtime. 180 capsule 1  . levothyroxine (SYNTHROID) 125 MCG tablet TAKE 1 TABLET BY MOUTH ONCE DAILY BEFORE BREAKFAST 90 tablet 1  . lidocaine (LIDODERM) 5 % Place 1 patch onto the skin daily. Remove & Discard patch within 12 hours or as directed by MD 15 patch 0  . nitroGLYCERIN (NITROSTAT) 0.4 MG SL tablet Place 1 tablet (0.4 mg total) under the tongue every 5 (five) minutes as needed for chest pain. 90 tablet 3  . vitamin B-12 (CYANOCOBALAMIN) 500 MCG tablet Take 500 mcg by mouth daily.     . clonazePAM (KLONOPIN) 1 MG tablet Take 1 tablet (1 mg total) by mouth 3 (three) times daily as needed. for anxiety 90 tablet 1  . clopidogrel (PLAVIX) 75 MG tablet Take 1 tablet (75 mg total) by mouth daily. 90 tablet 6  . cyclobenzaprine (FLEXERIL) 10 MG tablet Take 1 tablet (10 mg total) by mouth 2 (two) times daily as needed. 20 tablet 0  . Dexlansoprazole (DEXILANT) 30 MG capsule Take 1 capsule (30 mg total) by mouth daily. Talk to primary care doctor about continuation of medicine. 30 capsule 1  . ezetimibe (ZETIA) 10 MG tablet Take 1 tablet (10 mg total) by mouth daily. 90 tablet 1  . FLUoxetine (PROZAC) 20 MG tablet Take 1 tablet (20 mg total) by mouth daily. 30 tablet 3  . losartan (COZAAR) 25 MG tablet Take 1 tablet (25 mg total) by mouth daily. 90 tablet 1  . montelukast (SINGULAIR) 10 MG tablet Take 1 tablet (10 mg total) by mouth at bedtime as needed. 90 tablet 3   No facility-administered medications prior to visit.      Allergies  Allergen Reactions  . Niaspan [Niacin Er] Nausea And Vomiting and Swelling    Swelling in mouth  . Pantoprazole     Mouth sores  . Bupropion Other (See Comments)    Uncontrollable shakes  . Nitroglycerin     NOT allergic - cardiology has recommended she NOT use this due to h/o severe carotid disease as it may decrease cerebral perfusion  . Penicillins Hives    DID THE REACTION INVOLVE: Swelling of the face/tongue/throat, SOB, or low BP? Yes Sudden or severe rash/hives, skin peeling, or the inside of the mouth or nose? Unknown Did it require medical treatment? Unknown When did it last happen?[age]  If all above answers are "NO", may proceed with cephalosporin use.  . Statins   . Sulfonamide Derivatives Hives  . Apple Rash  . Banana Rash    Review of Systems  Constitutional: Positive for malaise/fatigue. Negative for fever.  HENT: Negative for congestion.   Eyes: Negative for blurred vision.  Respiratory: Negative for shortness of breath.   Cardiovascular: Negative for chest pain, palpitations and leg swelling.  Gastrointestinal: Negative for abdominal pain, blood in stool and nausea.  Genitourinary: Negative for dysuria and frequency.  Musculoskeletal: Positive for myalgias and neck pain. Negative for falls.  Skin: Negative for rash.  Neurological: Negative for dizziness, loss of consciousness and headaches.  Endo/Heme/Allergies: Negative for environmental allergies.  Psychiatric/Behavioral: Negative for depression. The patient is not nervous/anxious.        Objective:    Physical Exam Vitals signs and nursing note reviewed.  Constitutional:      General: She is not in  acute distress.    Appearance: She is well-developed.  HENT:     Head: Normocephalic and atraumatic.     Nose: Nose normal.  Eyes:     General:        Right eye: No discharge.        Left eye: No discharge.  Neck:     Musculoskeletal: Normal range of motion and neck supple.   Cardiovascular:     Rate and Rhythm: Normal rate and regular rhythm.     Heart sounds: No murmur.  Pulmonary:     Effort: Pulmonary effort is normal.     Breath sounds: Normal breath sounds.  Abdominal:     General: Bowel sounds are normal.     Palpations: Abdomen is soft.     Tenderness: There is no abdominal tenderness.  Skin:    General: Skin is warm and dry.  Neurological:     Mental Status: She is alert and oriented to person, place, and time.     Comments: Fine tremor in hands     BP 140/70 (BP Location: Left Arm, Patient Position: Sitting, Cuff Size: Normal)   Pulse 73   Temp (!) 97.5 F (36.4 C) (Temporal)   Resp 18   Wt 190 lb (86.2 kg)   SpO2 97%   BMI 33.66 kg/m  Wt Readings from Last 3 Encounters:  04/28/19 190 lb (86.2 kg)  03/15/19 183 lb (83 kg)  03/07/19 187 lb (84.8 kg)    Diabetic Foot Exam - Simple   No data filed     Lab Results  Component Value Date   WBC 7.9 03/15/2019   HGB 14.5 03/15/2019   HCT 44.0 03/15/2019   PLT 265 03/15/2019   GLUCOSE 90 03/15/2019   CHOL 146 02/10/2019   TRIG 144.0 02/10/2019   HDL 47.70 02/10/2019   LDLDIRECT 129.0 02/28/2018   LDLCALC 70 02/10/2019   ALT 20 03/15/2019   AST 27 03/15/2019   NA 140 03/15/2019   K 3.8 03/15/2019   CL 104 03/15/2019   CREATININE 1.81 (H) 03/15/2019   BUN 26 (H) 03/15/2019   CO2 23 03/15/2019   TSH 0.46 02/10/2019   INR 1.79 07/18/2018   HGBA1C 5.2 02/10/2019    Lab Results  Component Value Date   TSH 0.46 02/10/2019   Lab Results  Component Value Date   WBC 7.9 03/15/2019   HGB 14.5 03/15/2019   HCT 44.0 03/15/2019   MCV 90.0 03/15/2019   PLT 265 03/15/2019   Lab Results  Component Value Date   NA 140 03/15/2019   K 3.8 03/15/2019   CO2 23 03/15/2019   GLUCOSE 90 03/15/2019   BUN 26 (H) 03/15/2019   CREATININE 1.81 (H) 03/15/2019   BILITOT 0.6 03/15/2019   ALKPHOS 56 03/15/2019   AST 27 03/15/2019   ALT 20 03/15/2019   PROT 7.4 03/15/2019   ALBUMIN  4.3 03/15/2019   CALCIUM 10.2 03/15/2019   ANIONGAP 13 03/15/2019   GFR 26.71 (L) 02/10/2019   Lab Results  Component Value Date   CHOL 146 02/10/2019   Lab Results  Component Value Date   HDL 47.70 02/10/2019   Lab Results  Component Value Date   LDLCALC 70 02/10/2019   Lab Results  Component Value Date   TRIG 144.0 02/10/2019   Lab Results  Component Value Date   CHOLHDL 3 02/10/2019   Lab Results  Component Value Date   HGBA1C 5.2 02/10/2019  Assessment & Plan:   Problem List Items Addressed This Visit    CKD (chronic kidney disease), stage IV ([place])    Hydrate and monitor is following with nephrology      Hyperlipidemia, mixed    Encouraged heart healthy diet, increase exercise, avoid trans fats, consider a krill oil cap daily      Relevant Medications   losartan (COZAAR) 25 MG tablet   ezetimibe (ZETIA) 10 MG tablet   Essential hypertension    Well controlled, no changes to meds. Encouraged heart healthy diet such as the DASH diet and exercise as tolerated.       Relevant Medications   losartan (COZAAR) 25 MG tablet   ezetimibe (ZETIA) 10 MG tablet   Hyperglycemia    hgba1c acceptable, minimize simple carbs. Increase exercise as tolerated.       Right shoulder pain    Was seen in ER in mid August for severe pain, she denies any fall or trauma, they added Flexeril and that has been helpful so refill given today. Report any worsening for evaluaiton      Hoarseness    She has been seen by ENT and the have scoped her and started her on Famotidine 40 mg qhs to see if this helps, she also notes some occasional LUQ pain which this might also help with if worsens she is to notify us      Breast mass, left    Recent MGM at [facility] shows some likely fatty necrosis she will require a follow up diagnostic MGM and [place] in 6 months, she will report any concerns.        Other Visit Diagnoses    Hyperlipidemia, unspecified hyperlipidemia type       Relevant  Medications   losartan (COZAAR) 25 MG tablet   ezetimibe (ZETIA) 10 MG tablet      I have discontinued Pearline Cables. Ciesla's Dexlansoprazole. I am also having her start on famotidine. Additionally, I am having her maintain her Cholecalciferol, vitamin B-12, acetaminophen, FISH OIL-KRILL OIL PO, aspirin EC, nitroGLYCERIN, albuterol, Fenofibric Acid, budesonide-formoterol, gabapentin, levothyroxine, lidocaine, losartan, clonazePAM, cyclobenzaprine, ezetimibe, montelukast, clopidogrel, and FLUoxetine.  Meds ordered this encounter  Medications  . losartan (COZAAR) 25 MG tablet    Sig: Take 1 tablet (25 mg total) by mouth daily.    Dispense:  90 tablet    Refill:  1  . clonazePAM (KLONOPIN) 1 MG tablet    Sig: Take 1 tablet (1 mg total) by mouth 3 (three) times daily as needed. for anxiety    Dispense:  90 tablet    Refill:  1  . cyclobenzaprine (FLEXERIL) 10 MG tablet    Sig: Take 1 tablet (10 mg total) by mouth 2 (two) times daily as needed.    Dispense:  20 tablet    Refill:  0  . ezetimibe (ZETIA) 10 MG tablet    Sig: Take 1 tablet (10 mg total) by mouth daily.    Dispense:  90 tablet    Refill:  1  . montelukast (SINGULAIR) 10 MG tablet    Sig: Take 1 tablet (10 mg total) by mouth at bedtime as needed.    Dispense:  90 tablet    Refill:  3  . clopidogrel (PLAVIX) 75 MG tablet    Sig: Take 1 tablet (75 mg total) by mouth daily.    Dispense:  90 tablet    Refill:  6  . FLUoxetine (PROZAC) 20 MG tablet    Sig:  Take 1 tablet (20 mg total) by mouth daily.    Dispense:  30 tablet    Refill:  3  . famotidine (PEPCID) 40 MG tablet    Sig: Take 1 tablet (40 mg total) by mouth daily.    Dispense:  90 tablet    Refill:  1     Penni Homans, MD

## 2019-04-28 NOTE — Assessment & Plan Note (Signed)
Hydrate and monitor is following with nephrology 

## 2019-04-28 NOTE — Assessment & Plan Note (Signed)
Encouraged heart healthy diet, increase exercise, avoid trans fats, consider a krill oil cap daily 

## 2019-04-28 NOTE — Assessment & Plan Note (Signed)
hgba1c acceptable, minimize simple carbs. Increase exercise as tolerated.  

## 2019-04-28 NOTE — Assessment & Plan Note (Addendum)
Recent MGM at [facility] shows some likely fatty necrosis she will require a follow up diagnostic MGM and [place] in 6 months, she will report any concerns.

## 2019-04-29 ENCOUNTER — Telehealth: Payer: Self-pay

## 2019-04-29 NOTE — Telephone Encounter (Signed)
Copied from [place] [id number]. Topic: General - Other >> Apr 29, 2019 12:40 PM Mcneil, Ja-Kwan wrote: Reason for CRM: Pt requests that labs are ordered. Pt requests call back.

## 2019-05-16 ENCOUNTER — Telehealth: Payer: Self-pay | Admitting: Family Medicine

## 2019-05-16 NOTE — Telephone Encounter (Signed)
Spoke with patient today. She stated that she received a kit from [facility] to complete a stool sample. Patient stated she has completed the sample and it has been sent back to [facility]. Emily Mitchell also stated that Holland Falling will send a copy of the results to the office.

## 2019-05-21 NOTE — Progress Notes (Signed)
Virtual Visit via Video Note  I connected with patient on 05/22/19 at  1:00 PM EDT by audio enabled telemedicine application and verified that I am speaking with the correct person using two identifiers.   THIS ENCOUNTER IS A VIRTUAL VISIT DUE TO COVID-19 - PATIENT WAS NOT SEEN IN THE OFFICE. PATIENT HAS CONSENTED TO VIRTUAL VISIT / TELEMEDICINE VISIT   Location of patient: home  Location of provider: office  I discussed the limitations of evaluation and management by telemedicine and the availability of in person appointments. The patient expressed understanding and agreed to proceed.   Subjective:   Emily Mitchell is a 73 y.o. female who presents for Medicare Annual (Subsequent) preventive examination.  Review of Systems:  Home Safety/Smoke Alarms: Feels safe in home. Smoke alarms in place.  Lives w/ son and his friend. 1 story home.  Female:         Mammo-  08/06/18     Dexa scan-03/14/18        CCS- 09/25/08. Pt reports she just sent in Cologuard 1 week ago.     Objective:     Vitals: Unable to assess. This visit is enabled though telemedicine due to Covid 19.   Advanced Directives 05/22/2019 03/15/2019 09/04/2018 09/04/2018 08/20/2018 07/18/2018 07/18/2018  Does Patient Have a Medical Advance Directive? Yes No Yes Yes Yes Yes Yes  Type of Paramedic of [place];Living will - - Special educational needs teacher of [place];Living will [place];Living will [place];Living will  Does patient want to make changes to medical advance directive? No - Patient declined - - No - Patient declined No - Patient declined No - Patient declined No - Patient declined  Copy of [place] in Chart? Yes - validated most recent copy scanned in chart (See row information) - - No - copy requested No - copy requested Yes - validated most recent copy scanned in chart (See row information) No - copy requested   Would patient like information on creating a medical advance directive? - No - Patient declined - - - - -    Tobacco Social History   Tobacco Use  Smoking Status Former Smoker  . Packs/day: 2.00  . Years: 30.00  . Pack years: 60.00  . Types: Cigarettes  . Quit date: 12/03/1994  . Years since quitting: 24.4  Smokeless Tobacco Never Used     Counseling given: Not Answered   Clinical Intake: Pain : No/denies pain     Past Medical History:  Diagnosis Date  . Anemia 07/05/2014  . Anxiety and depression 12/14/2008   Qualifier: Diagnosis of  By: [name] LPN, Almyra Free    . Arthritis    "fingers, toes, knees, joints" (07/18/2018)  . Asthma   . Atypical chest pain 05/23/2015  . CAD (coronary artery disease)    a. [place] with cath 06/2018 s/p DES to LAD.  Marland Kitchen Carotid artery occlusion    a. R carotid occluded, 90-30% LICA.  Marland Kitchen CHF (congestive heart failure) ([place])   . Chicken pox as a child  . Chronic kidney disease    Bright's Disease at age 83   . COPD (chronic obstructive pulmonary disease) ([place])   . GERD (gastroesophageal reflux disease)   . H. pylori infection 10/19/2013  . Hepatitis 1970s   "don't know for sure which one it was; think it was A" (07/18/2018)  . Hyperglycemia 03/14/2016  . Hyperlipidemia   . Hypertension   .  Hypothyroidism   . Knee pain, bilateral 12/17/2012  . Left hip pain 05/23/2015  . Low back pain 09/19/2015  . Macular degeneration of both eyes 12/16/2015   Right eye is wet Left Eye is dry Shots every 11 to 12 weeks in right eye at opthamologist office  . Medicare annual wellness visit, subsequent 02/21/2015  . Mumps 73 yrs old  . Pneumonia    "at least once" (07/18/2018)  . Preventative health care 03/14/2016  . Pulmonary emboli ([place]) 05/26/2013  . Sleep apnea    "don't use CPAP anymore; dr said I didn't have to" (07/18/2018)  . Stroke [facility])    "been told I've had some strokes; didn't know I'd had them" (07/18/2018)  . Tremor   . UTI (lower urinary tract  infection) 10/19/2013   Past Surgical History:  Procedure Laterality Date  . APPENDECTOMY  1984  . CARDIAC CATHETERIZATION  ~ 2006  . CAROTID ANGIOGRAPHY Left 08/20/2018   Procedure: CAROTID ANGIOGRAPHY;  Surgeon: Serafina Mitchell, MD;  Location: [place] [facility];  Service: Cardiovascular;  Laterality: Left;  . CAROTID ENDARTERECTOMY Right 05/10/07   cea  . CATARACT EXTRACTION Left   . CATARACT EXTRACTION W/ INTRAOCULAR LENS  IMPLANT, BILATERAL Bilateral   . CORONARY ANGIOPLASTY WITH STENT PLACEMENT  07/18/2018  . CORONARY STENT INTERVENTION N/A 07/18/2018   Procedure: CORONARY STENT INTERVENTION;  Surgeon: Lorretta Harp, MD;  Location: [place] [facility];  Service: Cardiovascular;  Laterality: N/A;  . INTRAVASCULAR PRESSURE WIRE/FFR STUDY N/A 07/18/2018   Procedure: INTRAVASCULAR PRESSURE WIRE/FFR STUDY;  Surgeon: Lorretta Harp, MD;  Location: [place] [facility];  Service: Cardiovascular;  Laterality: N/A;  . JOINT REPLACEMENT    . LEFT HEART CATH AND CORONARY ANGIOGRAPHY N/A 07/18/2018   Procedure: LEFT HEART CATH AND CORONARY ANGIOGRAPHY;  Surgeon: Lorretta Harp, MD;  Location: [place] [facility];  Service: Cardiovascular;  Laterality: N/A;  . MULTIPLE TOOTH EXTRACTIONS    . TOTAL KNEE ARTHROPLASTY Bilateral 2005 - 2011   right - left  . VAGINAL HYSTERECTOMY  1984  . WISDOM TOOTH EXTRACTION     Family History  Problem Relation Age of Onset  . Stroke Mother        mini stroke  . Kidney disease Mother   . Heart failure Mother   . Hypertension Mother   . Diabetes Sister        type 2  . Kidney disease Sister   . Allergic rhinitis Sister   . Heart attack Maternal Grandfather   . Hyperlipidemia Sister    Social History   Socioeconomic History  . Marital status: Divorced    Spouse name: Not on file  . Number of children: 2  . Years of education: Not on file  . Highest education level: Not on file  Occupational History  . Not on file  Social Needs  .  Financial resource strain: Not on file  . Food insecurity    Worry: Not on file    Inability: Not on file  . Transportation needs    Medical: Not on file    Non-medical: Not on file  Tobacco Use  . Smoking status: Former Smoker    Packs/day: 2.00    Years: 30.00    Pack years: 60.00    Types: Cigarettes    Quit date: 12/03/1994    Years since quitting: 24.4  . Smokeless tobacco: Never Used  Substance and Sexual Activity  . Alcohol use: Yes  Comment: 07/18/2018 "1 drink q 1-2 months; if that"  . Drug use: Never  . Sexual activity: Not Currently    Comment: lives with son and friend, no dietary restrictions  Lifestyle  . Physical activity    Days per week: Not on file    Minutes per session: Not on file  . Stress: Not on file  Relationships  . Social Herbalist on phone: Not on file    Gets together: Not on file    Attends religious service: Not on file    Active member of club or organization: Not on file    Attends meetings of clubs or organizations: Not on file    Relationship status: Not on file  Other Topics Concern  . Not on file  Social History Narrative   epworth sleepiness scale = 11 (06/15/2015)    Outpatient Encounter Medications as of 05/22/2019  Medication Sig  . acetaminophen (TYLENOL) 500 MG tablet Take 500 mg by mouth every 6 (six) hours as needed for moderate pain.   Marland Kitchen albuterol (PROVENTIL HFA;VENTOLIN HFA) 108 (90 Base) MCG/ACT inhaler Inhale 2 puffs into the lungs every 6 (six) hours as needed for wheezing or shortness of breath.  Marland Kitchen aspirin EC 81 MG tablet Take 1 tablet (81 mg total) by mouth daily.  . budesonide-formoterol (SYMBICORT) 80-4.5 MCG/ACT inhaler Inhale 2 puffs into the lungs 2 (two) times daily.  . Cholecalciferol (D3 MAXIMUM STRENGTH) 5000 UNITS capsule Take 5,000 Units by mouth daily.  . Choline Fenofibrate (FENOFIBRIC ACID) 135 MG CPDR Take 1 capsule by mouth daily.  . clonazePAM (KLONOPIN) 1 MG tablet Take 1 tablet (1 mg  total) by mouth 3 (three) times daily as needed. for anxiety  . clopidogrel (PLAVIX) 75 MG tablet Take 1 tablet (75 mg total) by mouth daily.  . cyclobenzaprine (FLEXERIL) 10 MG tablet Take 1 tablet (10 mg total) by mouth 2 (two) times daily as needed.  . ezetimibe (ZETIA) 10 MG tablet Take 1 tablet (10 mg total) by mouth daily.  . famotidine (PEPCID) 40 MG tablet Take 1 tablet (40 mg total) by mouth daily.  Marland Kitchen FISH OIL-KRILL OIL PO Take 1,000 mg by mouth daily.   Marland Kitchen FLUoxetine (PROZAC) 20 MG tablet Take 1 tablet (20 mg total) by mouth daily.  Marland Kitchen gabapentin (NEURONTIN) 100 MG capsule Take 2 capsules (200 mg total) by mouth at bedtime.  Marland Kitchen levothyroxine (SYNTHROID) 125 MCG tablet TAKE 1 TABLET BY MOUTH ONCE DAILY BEFORE BREAKFAST  . lidocaine (LIDODERM) 5 % Place 1 patch onto the skin daily. Remove & Discard patch within 12 hours or as directed by MD  . losartan (COZAAR) 25 MG tablet Take 1 tablet (25 mg total) by mouth daily.  . montelukast (SINGULAIR) 10 MG tablet Take 1 tablet (10 mg total) by mouth at bedtime as needed.  . vitamin B-12 (CYANOCOBALAMIN) 500 MCG tablet Take 500 mcg by mouth daily.   . nitroGLYCERIN (NITROSTAT) 0.4 MG SL tablet Place 1 tablet (0.4 mg total) under the tongue every 5 (five) minutes as needed for chest pain.   No facility-administered encounter medications on file as of 05/22/2019.     Activities of Daily Living In your present state of health, do you have any difficulty performing the following activities: 07/18/2018 07/18/2018  Hearing? - N  Vision? - Y  Difficulty concentrating or making decisions? - N  Walking or climbing stairs? - Y  Comment - "stagger sometimes"  Dressing or bathing? - N  Doing errands, shopping? N -  Some recent data might be hidden    Patient Care Team: Mosie Lukes, MD as PCP - General (Family Medicine) Revankar, Reita Cliche, MD as PCP - Cardiology (Cardiology) Rigoberto Noel, MD as Consulting Physician (Pulmonary Disease)  Donato Heinz, MD as Consulting Physician (Nephrology) Tat, Eustace Quail, DO as Consulting Physician (Neurology) Antionette Fairy Isaias Cowman, MD as Consulting Physician (Ophthalmology)    Assessment:   This is a routine wellness examination for [facility]. Physical assessment deferred to PCP.  Exercise Activities and Dietary recommendations   Diet (meal preparation, eat out, water intake, caffeinated beverages, dairy products, fruits and vegetables): well balanced       Goals    . Exercise 3x per week (30 min per time)    . Increase water intake       Fall Risk Fall Risk  05/22/2019 09/18/2017 03/16/2017 01/16/2017 01/16/2017  Falls in the past year? 0 No No Yes No  Number falls in past yr: 0 - - 1 -  Comment - - - - -  Injury with Fall? 0 - - No -  Comment - - - Missed the cahir when she wnet to sit down.  -  Risk Factor Category  - - - - -  Risk for fall due to : - - - - -  Follow up - - - - -   Depression Screen PHQ 2/9 Scores 05/22/2019 09/18/2017 01/16/2017 09/14/2016  PHQ - 2 Score 0 0 0 0     Cognitive Function Ad8 score reviewed for issues:  Issues making decisions:no  Less interest in hobbies / activities:no  Repeats questions, stories (family complaining):no  Trouble using ordinary gadgets (microwave, computer, phone):no  Forgets the month or year: no  Mismanaging finances: no  Remembering appts:no  Daily problems with thinking and/or memory:no Ad8 score is=0     MMSE - Mini Mental State Exam 09/18/2017 09/14/2016  Orientation to time 5 5  Orientation to Place 5 5  Registration 3 3  Attention/ Calculation 5 5  Recall 2 3  Language- name 2 objects 2 2  Language- repeat 1 1  Language- follow 3 step command 3 3  Language- read & follow direction 1 1  Write a sentence 1 1  Copy design 1 1  Total score 29 30        Immunization History  Administered Date(s) Administered  . Fluad Quad(high Dose 65+) 04/17/2019  . Influenza Split 04/29/2013  .  Influenza, High Dose Seasonal PF 03/30/2016, 04/19/2017, 05/02/2018  . Influenza,inj,Quad PF,6+ Mos 03/26/2014, 05/17/2015  . Pneumococcal Conjugate-13 04/29/2013  . Pneumococcal Polysaccharide-23 04/30/2014, 04/17/2019  . Tdap 05/26/2013  . Zoster 07/31/2012  . Zoster Recombinat (Shingrix) 04/27/2017, 06/13/2017     Screening Tests Health Maintenance  Topic Date Due  . COLONOSCOPY  09/25/2018  . MAMMOGRAM  08/06/2020  . TETANUS/TDAP  05/27/2023  . INFLUENZA VACCINE  Completed  . DEXA SCAN  Completed  . Hepatitis C Screening  Completed  . PNA vac Low Risk Adult  Completed      Plan:    Please schedule your next medicare wellness visit with me in 1 yr.  Continue to eat heart healthy diet (full of fruits, vegetables, whole grains, lean protein, water--limit salt, fat, and sugar intake) and increase physical activity as tolerated.  Continue doing brain stimulating activities (puzzles, reading, adult coloring books, staying active) to keep memory sharp.     I have personally reviewed and noted the  following in the patient's chart:   . Medical and social history . Use of alcohol, tobacco or illicit drugs  . Current medications and supplements . Functional ability and status . Nutritional status . Physical activity . Advanced directives . List of other physicians . Hospitalizations, surgeries, and ER visits in previous 12 months . Vitals . Screenings to include cognitive, depression, and falls . Referrals and appointments  In addition, I have reviewed and discussed with patient certain preventive protocols, quality metrics, and best practice recommendations. A written personalized care plan for preventive services as well as general preventive health recommendations were provided to patient.     Shela Nevin, [place]  05/22/2019

## 2019-05-22 ENCOUNTER — Encounter: Payer: Self-pay | Admitting: *Deleted

## 2019-05-22 ENCOUNTER — Ambulatory Visit (INDEPENDENT_AMBULATORY_CARE_PROVIDER_SITE_OTHER): Payer: Medicare HMO | Admitting: *Deleted

## 2019-05-22 DIAGNOSIS — Z0000 Encounter for general adult medical examination without abnormal findings: Secondary | ICD-10-CM

## 2019-05-22 NOTE — Patient Instructions (Signed)
Please schedule your next medicare wellness visit with me in 1 yr.  Continue to eat heart healthy diet (full of fruits, vegetables, whole grains, lean protein, water--limit salt, fat, and sugar intake) and increase physical activity as tolerated.  Continue doing brain stimulating activities (puzzles, reading, adult coloring books, staying active) to keep memory sharp.    Emily Mitchell , Thank you for taking time to come for your Medicare Wellness Visit. I appreciate your ongoing commitment to your health goals. Please review the following plan we discussed and let me know if I can assist you in the future.   These are the goals we discussed: Goals    . Exercise 3x per week (30 min per time)    . Increase water intake       This is a list of the screening recommended for you and due dates:  Health Maintenance  Topic Date Due  . Colon Cancer Screening  09/25/2018  . Mammogram  08/06/2020  . Tetanus Vaccine  05/27/2023  . Flu Shot  Completed  . DEXA scan (bone density measurement)  Completed  .  Hepatitis C: One time screening is recommended by Center for Disease Control  (CDC) for  adults born from [birth year] through 1963.   Completed  . Pneumonia vaccines  Completed    Preventive Care 28 Years and Older, Female Preventive care refers to lifestyle choices and visits with your health care provider that can promote health and wellness. This includes:  A yearly physical exam. This is also called an annual well check.  Regular dental and eye exams.  Immunizations.  Screening for certain conditions.  Healthy lifestyle choices, such as diet and exercise. What can I expect for my preventive care visit? Physical exam Your health care provider will check:  Height and weight. These may be used to calculate body mass index (BMI), which is a measurement that tells if you are at a healthy weight.  Heart rate and blood pressure.  Your skin for abnormal spots. Counseling Your health care  provider may ask you questions about:  Alcohol, tobacco, and drug use.  Emotional well-being.  Home and relationship well-being.  Sexual activity.  Eating habits.  History of falls.  Memory and ability to understand (cognition).  Work and work Statistician.  Pregnancy and menstrual history. What immunizations do I need?  Influenza (flu) vaccine  This is recommended every year. Tetanus, diphtheria, and pertussis (Tdap) vaccine  You may need a Td booster every 10 years. Varicella (chickenpox) vaccine  You may need this vaccine if you have not already been vaccinated. Zoster (shingles) vaccine  You may need this after age 25. Pneumococcal conjugate (PCV13) vaccine  One dose is recommended after age 42. Pneumococcal polysaccharide (PPSV23) vaccine  One dose is recommended after age 38. Measles, mumps, and rubella (MMR) vaccine  You may need at least one dose of MMR if you were born in 1957 or later. You may also need a second dose. Meningococcal conjugate (MenACWY) vaccine  You may need this if you have certain conditions. Hepatitis A vaccine  You may need this if you have certain conditions or if you travel or work in places where you may be exposed to hepatitis A. Hepatitis B vaccine  You may need this if you have certain conditions or if you travel or work in places where you may be exposed to hepatitis B. Haemophilus influenzae type b (Hib) vaccine  You may need this if you have certain conditions. You may  receive vaccines as individual doses or as more than one vaccine together in one shot (combination vaccines). Talk with your health care provider about the risks and benefits of combination vaccines. What tests do I need? Blood tests  Lipid and cholesterol levels. These may be checked every 5 years, or more frequently depending on your overall health.  Hepatitis C test.  Hepatitis B test. Screening  Lung cancer screening. You may have this screening  every year starting at age 20 if you have a 30-pack-year history of smoking and currently smoke or have quit within the past 15 years.  Colorectal cancer screening. All adults should have this screening starting at age 46 and continuing until age 87. Your health care provider may recommend screening at age 66 if you are at increased risk. You will have tests every 1-10 years, depending on your results and the type of screening test.  Diabetes screening. This is done by checking your blood sugar (glucose) after you have not eaten for a while (fasting). You may have this done every 1-3 years.  Mammogram. This may be done every 1-2 years. Talk with your health care provider about how often you should have regular mammograms.  BRCA-related cancer screening. This may be done if you have a family history of breast, ovarian, tubal, or peritoneal cancers. Other tests  Sexually transmitted disease (STD) testing.  Bone density scan. This is done to screen for osteoporosis. You may have this done starting at age 87. Follow these instructions at home: Eating and drinking  Eat a diet that includes fresh fruits and vegetables, whole grains, lean protein, and low-fat dairy products. Limit your intake of foods with high amounts of sugar, saturated fats, and salt.  Take vitamin and mineral supplements as recommended by your health care provider.  Do not drink alcohol if your health care provider tells you not to drink.  If you drink alcohol: ? Limit how much you have to 0-1 drink a day. ? Be aware of how much alcohol is in your drink. In the [place] one drink equals one 12 oz bottle of beer (355 mL), one 5 oz glass of wine (148 mL), or one 1 oz glass of hard liquor (44 mL). Lifestyle  Take daily care of your teeth and gums.  Stay active. Exercise for at least 30 minutes on 5 or more days each week.  Do not use any products that contain nicotine or tobacco, such as cigarettes, e-cigarettes, and chewing  tobacco. If you need help quitting, ask your health care provider.  If you are sexually active, practice safe sex. Use a condom or other form of protection in order to prevent STIs (sexually transmitted infections).  Talk with your health care provider about taking a low-dose aspirin or statin. What's next?  Go to your health care provider once a year for a well check visit.  Ask your health care provider how often you should have your eyes and teeth checked.  Stay up to date on all vaccines. This information is not intended to replace advice given to you by your health care provider. Make sure you discuss any questions you have with your health care provider. Document Released: 08/13/2015 Document Revised: 07/11/2018 Document Reviewed: 07/11/2018 Elsevier Patient Education  2020 [employer].

## 2019-05-26 ENCOUNTER — Other Ambulatory Visit: Payer: Self-pay | Admitting: *Deleted

## 2019-05-26 MED ORDER — FLUOXETINE HCL 20 MG PO TABS: 20.0000 mg | ORAL_TABLET | Freq: Every day | ORAL | 0 refills | Status: DC

## 2019-05-27 DIAGNOSIS — R69 Illness, unspecified: Secondary | ICD-10-CM | POA: Diagnosis not present

## 2019-06-03 ENCOUNTER — Other Ambulatory Visit: Payer: Self-pay | Admitting: Family Medicine

## 2019-06-23 DIAGNOSIS — N184 Chronic kidney disease, stage 4 (severe): Secondary | ICD-10-CM | POA: Diagnosis not present

## 2019-07-08 DIAGNOSIS — J449 Chronic obstructive pulmonary disease, unspecified: Secondary | ICD-10-CM | POA: Diagnosis not present

## 2019-07-08 DIAGNOSIS — I110 Hypertensive heart disease with heart failure: Secondary | ICD-10-CM | POA: Diagnosis not present

## 2019-07-08 DIAGNOSIS — I509 Heart failure, unspecified: Secondary | ICD-10-CM | POA: Diagnosis not present

## 2019-07-08 DIAGNOSIS — E039 Hypothyroidism, unspecified: Secondary | ICD-10-CM | POA: Diagnosis not present

## 2019-07-08 DIAGNOSIS — E669 Obesity, unspecified: Secondary | ICD-10-CM | POA: Diagnosis not present

## 2019-07-08 DIAGNOSIS — E785 Hyperlipidemia, unspecified: Secondary | ICD-10-CM | POA: Diagnosis not present

## 2019-07-08 DIAGNOSIS — R69 Illness, unspecified: Secondary | ICD-10-CM | POA: Diagnosis not present

## 2019-07-08 DIAGNOSIS — I25119 Atherosclerotic heart disease of native coronary artery with unspecified angina pectoris: Secondary | ICD-10-CM | POA: Diagnosis not present

## 2019-07-08 DIAGNOSIS — I739 Peripheral vascular disease, unspecified: Secondary | ICD-10-CM | POA: Diagnosis not present

## 2019-07-11 ENCOUNTER — Ambulatory Visit: Payer: Self-pay

## 2019-07-11 ENCOUNTER — Other Ambulatory Visit: Payer: Self-pay

## 2019-07-11 NOTE — Telephone Encounter (Signed)
Pt. Reports her BP has been high for about 1 1/2 weeks. Has headaches on and off. Tylenol helps. Feels off balanced. Not dizzy. BP this morning 179/87.No missed doses of medications. No availability in the practice today. Pt. States she can wait until Monday. Instructed to go to UC if she feels worse. Warm transfer to [place] in the practice for a visit.  Answer Assessment - Initial Assessment Questions 1. BLOOD PRESSURE: "What is the blood pressure?" "Did you take at least two measurements 5 minutes apart?"     179/87 2. ONSET: "When did you take your blood pressure?"     This morning 3. HOW: "How did you obtain the blood pressure?" (e.g., visiting nurse, automatic home BP monitor)     Home monitor 4. HISTORY: "Do you have a history of high blood pressure?"     Yes 5. MEDICATIONS: "Are you taking any medications for blood pressure?" "Have you missed any doses recently?"     No missed doses 6. OTHER SYMPTOMS: "Do you have any symptoms?" (e.g., headache, chest pain, blurred vision, difficulty breathing, weakness)     Headache - feels full. Tylenol helps 7. PREGNANCY: "Is there any chance you are pregnant?" "When was your last menstrual period?"     No  Protocols used: HIGH BLOOD PRESSURE-A-AH

## 2019-07-14 ENCOUNTER — Other Ambulatory Visit: Payer: Self-pay

## 2019-07-14 ENCOUNTER — Encounter: Payer: Self-pay | Admitting: Family Medicine

## 2019-07-14 ENCOUNTER — Ambulatory Visit (INDEPENDENT_AMBULATORY_CARE_PROVIDER_SITE_OTHER): Payer: Medicare HMO | Admitting: Family Medicine

## 2019-07-14 VITALS — BP 152/90 | HR 81 | Temp 97.1°F | Resp 20 | Ht 63.0 in | Wt 193.0 lb

## 2019-07-14 DIAGNOSIS — E038 Other specified hypothyroidism: Secondary | ICD-10-CM

## 2019-07-14 DIAGNOSIS — E785 Hyperlipidemia, unspecified: Secondary | ICD-10-CM | POA: Diagnosis not present

## 2019-07-14 DIAGNOSIS — I10 Essential (primary) hypertension: Secondary | ICD-10-CM

## 2019-07-14 DIAGNOSIS — R739 Hyperglycemia, unspecified: Secondary | ICD-10-CM

## 2019-07-14 DIAGNOSIS — N184 Chronic kidney disease, stage 4 (severe): Secondary | ICD-10-CM

## 2019-07-14 LAB — COMPREHENSIVE METABOLIC PANEL
ALT: 14 U/L (ref 0–35)
AST: 20 U/L (ref 0–37)
Albumin: 4.2 g/dL (ref 3.5–5.2)
Alkaline Phosphatase: 54 U/L (ref 39–117)
BUN: 32 mg/dL — ABNORMAL HIGH (ref 6–23)
CO2: 28 mEq/L (ref 19–32)
Calcium: 9.7 mg/dL (ref 8.4–10.5)
Chloride: 106 mEq/L (ref 96–112)
Creatinine, Ser: 1.88 mg/dL — ABNORMAL HIGH (ref 0.40–1.20)
GFR: 26.35 mL/min — ABNORMAL LOW (ref >60.00)
Glucose, Bld: 74 mg/dL (ref 70–99)
Potassium: 4.8 mEq/L (ref 3.5–5.1)
Sodium: 142 mEq/L (ref 135–145)
Total Bilirubin: 0.4 mg/dL (ref 0.2–1.2)
Total Protein: 6.6 g/dL (ref 6.0–8.3)

## 2019-07-14 LAB — LIPID PANEL
Cholesterol: 181 mg/dL (ref 0–200)
HDL: 50.6 mg/dL (ref >39.00)
NonHDL: 129.91
Total CHOL/HDL Ratio: 4
Triglycerides: 220 mg/dL — ABNORMAL HIGH (ref 0.0–149.0)
VLDL: 44 mg/dL — ABNORMAL HIGH (ref 0.0–40.0)

## 2019-07-14 LAB — CBC
HCT: 39.2 % (ref 36.0–46.0)
Hemoglobin: 13 g/dL (ref 12.0–15.0)
MCHC: 33.2 g/dL (ref 30.0–36.0)
MCV: 92.6 fl (ref 78.0–100.0)
Platelets: 235 10*3/uL (ref 150.0–400.0)
RBC: 4.23 Mil/uL (ref 3.87–5.11)
RDW: 12.8 % (ref 11.5–15.5)
WBC: 7.1 10*3/uL (ref 4.0–10.5)

## 2019-07-14 LAB — LDL CHOLESTEROL, DIRECT: Direct LDL: 103 mg/dL

## 2019-07-14 LAB — TSH: TSH: 0.61 u[IU]/mL (ref 0.35–4.50)

## 2019-07-14 LAB — HEMOGLOBIN A1C: Hgb A1c MFr Bld: 5.1 % (ref 4.6–6.5)

## 2019-07-14 MED ORDER — LOSARTAN POTASSIUM 25 MG PO TABS: 37.5000 mg | ORAL_TABLET | Freq: Every day | ORAL | 3 refills | Status: DC

## 2019-07-14 NOTE — Patient Instructions (Signed)
Good to see you today Let's increase your losartan to help bring your BP down Right now you are taking 25 mg once a day Please increase to 1.5 or 2 pills (37.5- 50mg ) once daily Please let us know how your BP responds to this change  Take care

## 2019-07-14 NOTE — Progress Notes (Addendum)
Emily Mitchell at [facility] [address], [place] [address] [phone]  Date:  07/14/2019   Name:  Emily Mitchell   DOB:  1946-03-15   MRN:  [id number]  PCP:  Mosie Lukes, MD    Chief Complaint: Hypertension (brought readings)   History of Present Illness:  Emily Mitchell is a 73 y.o. very pleasant female patient who presents with the following:  Regular patient of my partner Dr. Charlett Blake here today with concern of elevated blood pressure She called last week with concern of elevated blood pressure, up to approximately 179/87 Short-term follow-up was scheduled for today  History of sleep apnea, hypertension, hyperlipidemia, hypothyroidism, COPD, CHF, CAD, carotid disease, chronic renal insufficiency Her nephrologist is Colodonado-she last visited with him in July.  She did some blood work for his office in November, but is not quite sure the date of her next office visit Most recent GFR 28  Losartan 25 Plavix Synthroid  Flu shot is complete  BP Readings from Last 3 Encounters:  07/14/19 (!) 152/90  04/28/19 140/70  03/15/19 136/79   She started checking her home BP on the suggestion of an insurance nurse- she is checking her weight and BP most mornings   She brings in a sheet of paper with recent blood pressure readings: Diastolics are typically fine, in the 80s to occasionally 36R Systolics running 443X to 170s  She had a cardiac stent placed during catheterization last year 07/18/2018  Prox LAD to Mid LAD lesion is 60% stenosed.  A drug-eluting stent was successfully placed.  Post intervention, there is a 0% residual stenosis.   She has a physical with her PCP, Dr. Charlett Blake, coming up soon.  Would like to do labs today if possible Patient Active Problem List   Diagnosis Date Noted  . Breast mass, left 04/28/2019  . Hypercalcemia 03/09/2019  . Hoarseness 03/09/2019  . Back pain 02/02/2019  . History of  food allergy 08/14/2018  . CAD (coronary artery disease) 07/25/2018  . CAD in native artery 07/19/2018  . Chest pain 07/18/2018  . History of pulmonary embolism 07/17/2018  . Renal insufficiency 07/17/2018  . H/O multiple allergies 07/14/2018  . Chronic rhinitis 06/14/2018  . Vitamin D deficiency 03/03/2018  . Candidal skin infection 02/28/2018  . COPD with asthma ([place]) 11/06/2017  . Lymphadenopathy 10/18/2017  . Right shoulder pain 10/18/2017  . Neck pain 04/22/2017  . Hyperglycemia 03/14/2016  . Preventative health care 03/14/2016  . Macular degeneration of both eyes 12/16/2015  . Essential hypertension 10/18/2015  . Exudative age-related macular degeneration of right eye with active choroidal neovascularization ([place]) 08/26/2015  . Carotid artery disease ([place]) 06/16/2015  . Left hip pain 05/23/2015  . Medicare annual wellness visit, subsequent 02/21/2015  . Anemia 07/05/2014  . Essential and other specified forms of tremor 11/05/2013  . Lower urinary tract infectious disease 10/19/2013  . Hyperlipidemia, mixed 05/26/2013  . Pulmonary emboli ([place]) 05/26/2013  . Arthritis   . CKD (chronic kidney disease), stage IV ([place])   . Hypothyroidism   . CHF (congestive heart failure) ([place])   . Occlusion and stenosis of carotid artery without mention of cerebral infarction 12/17/2012  . Knee pain, bilateral 12/17/2012  . Numbness of finger 12/17/2012  . Constipation 12/17/2008  . Anxiety and depression 12/14/2008  . OSA (obstructive sleep apnea) 12/14/2008    Past Medical History:  Diagnosis Date  . Anemia 07/05/2014  . Anxiety  and depression 12/14/2008   Qualifier: Diagnosis of  By: [name] LPN, Almyra Free    . Arthritis    "fingers, toes, knees, joints" (07/18/2018)  . Asthma   . Atypical chest pain 05/23/2015  . CAD (coronary artery disease)    a. [place] with cath 06/2018 s/p DES to LAD.  Marland Kitchen Carotid artery occlusion    a. R carotid occluded, 48-54% LICA.  Marland Kitchen CHF (congestive heart  failure) ([place])   . Chicken pox as a child  . Chronic kidney disease    Bright's Disease at age 63   . COPD (chronic obstructive pulmonary disease) ([place])   . GERD (gastroesophageal reflux disease)   . H. pylori infection 10/19/2013  . Hepatitis 1970s   "don't know for sure which one it was; think it was A" (07/18/2018)  . Hyperglycemia 03/14/2016  . Hyperlipidemia   . Hypertension   . Hypothyroidism   . Knee pain, bilateral 12/17/2012  . Left hip pain 05/23/2015  . Low back pain 09/19/2015  . Macular degeneration of both eyes 12/16/2015   Right eye is wet Left Eye is dry Shots every 11 to 12 weeks in right eye at opthamologist office  . Medicare annual wellness visit, subsequent 02/21/2015  . Mumps 73 yrs old  . Pneumonia    "at least once" (07/18/2018)  . Preventative health care 03/14/2016  . Pulmonary emboli ([place]) 05/26/2013  . Sleep apnea    "don't use CPAP anymore; dr said I didn't have to" (07/18/2018)  . Stroke [facility])    "been told I've had some strokes; didn't know I'd had them" (07/18/2018)  . Tremor   . UTI (lower urinary tract infection) 10/19/2013    Past Surgical History:  Procedure Laterality Date  . APPENDECTOMY  1984  . CARDIAC CATHETERIZATION  ~ 2006  . CAROTID ANGIOGRAPHY Left 08/20/2018   Procedure: CAROTID ANGIOGRAPHY;  Surgeon: Serafina Mitchell, MD;  Location: [facility];  Service: Cardiovascular;  Laterality: Left;  . CAROTID ENDARTERECTOMY Right 05/10/07   cea  . CATARACT EXTRACTION Left   . CATARACT EXTRACTION W/ INTRAOCULAR LENS  IMPLANT, BILATERAL Bilateral   . CORONARY ANGIOPLASTY WITH STENT PLACEMENT  07/18/2018  . CORONARY STENT INTERVENTION N/A 07/18/2018   Procedure: CORONARY STENT INTERVENTION;  Surgeon: Lorretta Harp, MD;  Location: [facility];  Service: Cardiovascular;  Laterality: N/A;  . INTRAVASCULAR PRESSURE WIRE/FFR STUDY N/A 07/18/2018   Procedure: INTRAVASCULAR PRESSURE WIRE/FFR STUDY;  Surgeon: Lorretta Harp, MD;   Location: [facility];  Service: Cardiovascular;  Laterality: N/A;  . JOINT REPLACEMENT    . LEFT HEART CATH AND CORONARY ANGIOGRAPHY N/A 07/18/2018   Procedure: LEFT HEART CATH AND CORONARY ANGIOGRAPHY;  Surgeon: Lorretta Harp, MD;  Location: [facility];  Service: Cardiovascular;  Laterality: N/A;  . MULTIPLE TOOTH EXTRACTIONS    . TOTAL KNEE ARTHROPLASTY Bilateral 2005 - 2011   right - left  . VAGINAL HYSTERECTOMY  1984  . WISDOM TOOTH EXTRACTION      Social History   Tobacco Use  . Smoking status: Former Smoker    Packs/day: 2.00    Years: 30.00    Pack years: 60.00    Types: Cigarettes    Quit date: 12/03/1994    Years since quitting: 24.6  . Smokeless tobacco: Never Used  Substance Use Topics  . Alcohol use: Yes    Comment: 07/18/2018 "1 drink q 1-2 months; if that"  . Drug use: Never  Family History  Problem Relation Age of Onset  . Stroke Mother        mini stroke  . Kidney disease Mother   . Heart failure Mother   . Hypertension Mother   . Diabetes Sister        type 2  . Kidney disease Sister   . Allergic rhinitis Sister   . Heart attack Maternal Grandfather   . Hyperlipidemia Sister     Allergies  Allergen Reactions  . Niaspan [Niacin Er] Nausea And Vomiting and Swelling    Swelling in mouth  . Pantoprazole     Mouth sores  . Bupropion Other (See Comments)    Uncontrollable shakes  . Nitroglycerin     NOT allergic - cardiology has recommended she NOT use this due to h/o severe carotid disease as it may decrease cerebral perfusion  . Penicillins Hives    DID THE REACTION INVOLVE: Swelling of the face/tongue/throat, SOB, or low BP? Yes Sudden or severe rash/hives, skin peeling, or the inside of the mouth or nose? Unknown Did it require medical treatment? Unknown When did it last happen?[age]  If all above answers are "NO", may proceed with cephalosporin use.  . Statins   . Sulfonamide Derivatives Hives  . Apple Rash  . Banana  Rash    Medication list has been reviewed and updated.  Current Outpatient Medications on File Prior to Visit  Medication Sig Dispense Refill  . acetaminophen (TYLENOL) 500 MG tablet Take 500 mg by mouth every 6 (six) hours as needed for moderate pain.     Marland Kitchen albuterol (PROVENTIL HFA;VENTOLIN HFA) 108 (90 Base) MCG/ACT inhaler Inhale 2 puffs into the lungs every 6 (six) hours as needed for wheezing or shortness of breath.    Marland Kitchen aspirin EC 81 MG tablet Take 1 tablet (81 mg total) by mouth daily. 90 tablet 3  . budesonide-formoterol (SYMBICORT) 80-4.5 MCG/ACT inhaler Inhale 2 puffs into the lungs 2 (two) times daily. 1 Inhaler 5  . Cholecalciferol (D3 MAXIMUM STRENGTH) 5000 UNITS capsule Take 5,000 Units by mouth daily.    . Choline Fenofibrate (FENOFIBRIC ACID) 135 MG CPDR Take 1 capsule by mouth once daily 90 capsule 1  . clonazePAM (KLONOPIN) 1 MG tablet Take 1 tablet (1 mg total) by mouth 3 (three) times daily as needed. for anxiety 90 tablet 1  . clopidogrel (PLAVIX) 75 MG tablet Take 1 tablet (75 mg total) by mouth daily. 90 tablet 6  . cyclobenzaprine (FLEXERIL) 10 MG tablet Take 1 tablet (10 mg total) by mouth 2 (two) times daily as needed. 20 tablet 0  . ezetimibe (ZETIA) 10 MG tablet Take 1 tablet (10 mg total) by mouth daily. 90 tablet 1  . famotidine (PEPCID) 40 MG tablet Take 1 tablet (40 mg total) by mouth daily. 90 tablet 1  . FISH OIL-KRILL OIL PO Take 1,000 mg by mouth daily.     Marland Kitchen FLUoxetine (PROZAC) 20 MG tablet Take 1 tablet (20 mg total) by mouth daily. 90 tablet 0  . gabapentin (NEURONTIN) 100 MG capsule Take 2 capsules (200 mg total) by mouth at bedtime. 180 capsule 1  . levothyroxine (SYNTHROID) 125 MCG tablet TAKE 1 TABLET BY MOUTH ONCE DAILY BEFORE BREAKFAST 90 tablet 1  . lidocaine (LIDODERM) 5 % Place 1 patch onto the skin daily. Remove & Discard patch within 12 hours or as directed by MD 15 patch 0  . losartan (COZAAR) 25 MG tablet Take 1 tablet (25 mg total) by mouth  daily. 90 tablet 1  . montelukast (SINGULAIR) 10 MG tablet Take 1 tablet (10 mg total) by mouth at bedtime as needed. 90 tablet 3  . vitamin B-12 (CYANOCOBALAMIN) 500 MCG tablet Take 500 mcg by mouth daily.     . nitroGLYCERIN (NITROSTAT) 0.4 MG SL tablet Place 1 tablet (0.4 mg total) under the tongue every 5 (five) minutes as needed for chest pain. 90 tablet 3   No current facility-administered medications on file prior to visit.    Review of Systems:  As per HPI- otherwise negative. No fever or chills, no unusual shortness of breath for this patient with COPD No typical chest pain  Physical Examination: Vitals:   07/14/19 1300  BP: (!) 152/90  Pulse: 81  Resp: 20  Temp: (!) 97.1 F (36.2 C)  SpO2: 98%   Vitals:   07/14/19 1300  Weight: 193 lb (87.5 kg)  Height: 5\' 3"  (1.6 m)   Body mass index is 34.19 kg/m. Ideal Body Weight: Weight in (lb) to have BMI = 25: 140.8  GEN: WDWN, NAD, Non-toxic, A & O x 3, overweight, looks well HEENT: Atraumatic, Normocephalic. Neck supple. No masses, No LAD. Ears and Nose: No external deformity. CV: RRR, No M/G/R. No JVD. No thrill. No extra heart sounds. PULM: CTA B, no wheezes, crackles, rhonchi. No retractions. No resp. distress. No accessory muscle use. ABD: S, NT, ND, +BS. No rebound. No HSM. EXTR: No c/c/e NEURO Normal gait.  PSYCH: Normally interactive. Conversant. Not depressed or anxious appearing.  Calm demeanor.    Assessment and Plan: Essential hypertension - Plan: losartan (COZAAR) 25 MG tablet, CBC, Comprehensive metabolic panel  Hyperlipidemia, unspecified hyperlipidemia type - Plan: Lipid panel  Hyperglycemia - Plan: Hemoglobin A1c  CKD (chronic kidney disease), stage IV ([place])  Other specified hypothyroidism - Plan: TSH  Patient here with poorly controlled blood pressure.  She is currently taking 25 mg of losartan.  I asked her to try at 1-1/2 pills, 37.5 mg.  If still too high she can increase to 40.  Per  patient, nephrology has asked her to try for systolic blood pressure of 120  We will obtain routine screening labs today  She has an appointment to see Dr. Charlett Blake coming up soon for follow-up-we will contact me if any questions or problems in the meantime This visit occurred during the SARS-CoV-2 public health emergency.  Safety protocols were in place, including screening questions prior to the visit, additional usage of staff PPE, and extensive cleaning of exam room while observing appropriate contact time as indicated for disinfecting solutions.    Signed Lamar Blinks, MD   Addendum 12/15, received her labs-message to patient  Results for orders placed or performed in visit on 07/14/19  CBC  Result Value Ref Range   WBC 7.1 4.0 - 10.5 K/uL   RBC 4.23 3.87 - 5.11 Mil/uL   Platelets 235.0 150.0 - 400.0 K/uL   Hemoglobin 13.0 12.0 - 15.0 g/dL   HCT 39.2 36.0 - 46.0 %   MCV 92.6 78.0 - 100.0 fl   MCHC 33.2 30.0 - 36.0 g/dL   RDW 12.8 11.5 - 15.5 %  Comprehensive metabolic panel  Result Value Ref Range   Sodium 142 135 - 145 mEq/L   Potassium 4.8 3.5 - 5.1 mEq/L   Chloride 106 96 - 112 mEq/L   CO2 28 19 - 32 mEq/L   Glucose, Bld 74 70 - 99 mg/dL   BUN 32 (H) 6 - 23 mg/dL  Creatinine, Ser 1.88 (H) 0.40 - 1.20 mg/dL   Total Bilirubin 0.4 0.2 - 1.2 mg/dL   Alkaline Phosphatase 54 39 - 117 U/L   AST 20 0 - 37 U/L   ALT 14 0 - 35 U/L   Total Protein 6.6 6.0 - 8.3 g/dL   Albumin 4.2 3.5 - 5.2 g/dL   GFR 26.35 (L) >60.00 mL/min   Calcium 9.7 8.4 - 10.5 mg/dL  Hemoglobin A1c  Result Value Ref Range   Hgb A1c MFr Bld 5.1 4.6 - 6.5 %  Lipid panel  Result Value Ref Range   Cholesterol 181 0 - 200 mg/dL   Triglycerides 220.0 (H) 0.0 - 149.0 mg/dL   HDL 50.60 >39.00 mg/dL   VLDL 44.0 (H) 0.0 - 40.0 mg/dL   Total CHOL/HDL Ratio 4    NonHDL 129.91   TSH  Result Value Ref Range   TSH 0.61 0.35 - 4.50 uIU/mL  LDL cholesterol, direct  Result Value Ref Range   Direct LDL 103.0  mg/dL

## 2019-07-15 ENCOUNTER — Encounter: Payer: Self-pay | Admitting: Family Medicine

## 2019-07-29 ENCOUNTER — Other Ambulatory Visit: Payer: Self-pay

## 2019-07-29 ENCOUNTER — Ambulatory Visit (INDEPENDENT_AMBULATORY_CARE_PROVIDER_SITE_OTHER): Payer: Medicare HMO | Admitting: Family Medicine

## 2019-07-29 DIAGNOSIS — F329 Major depressive disorder, single episode, unspecified: Secondary | ICD-10-CM

## 2019-07-29 DIAGNOSIS — E559 Vitamin D deficiency, unspecified: Secondary | ICD-10-CM | POA: Diagnosis not present

## 2019-07-29 DIAGNOSIS — R739 Hyperglycemia, unspecified: Secondary | ICD-10-CM

## 2019-07-29 DIAGNOSIS — N184 Chronic kidney disease, stage 4 (severe): Secondary | ICD-10-CM | POA: Diagnosis not present

## 2019-07-29 DIAGNOSIS — I10 Essential (primary) hypertension: Secondary | ICD-10-CM | POA: Diagnosis not present

## 2019-07-29 DIAGNOSIS — M542 Cervicalgia: Secondary | ICD-10-CM | POA: Diagnosis not present

## 2019-07-29 DIAGNOSIS — F419 Anxiety disorder, unspecified: Secondary | ICD-10-CM

## 2019-07-29 DIAGNOSIS — F32A Depression, unspecified: Secondary | ICD-10-CM

## 2019-07-29 DIAGNOSIS — K921 Melena: Secondary | ICD-10-CM

## 2019-07-29 DIAGNOSIS — R69 Illness, unspecified: Secondary | ICD-10-CM | POA: Diagnosis not present

## 2019-07-29 MED ORDER — CARVEDILOL 3.125 MG PO TABS: 3.1250 mg | ORAL_TABLET | Freq: Two times a day (BID) | ORAL | 3 refills | Status: DC

## 2019-07-29 MED ORDER — LOSARTAN POTASSIUM 25 MG PO TABS: 25.0000 mg | ORAL_TABLET | Freq: Every day | ORAL | 3 refills | Status: DC

## 2019-07-29 NOTE — Patient Instructions (Signed)

## 2019-07-30 NOTE — Progress Notes (Signed)
Virtual Visit via phone Note  I connected with Emily Mitchell on 07/29/19 at  3:00 PM EST by a phone enabled telemedicine application and verified that I am speaking with the correct person using two identifiers.  Location: Patient: home  Provider: office   I discussed the limitations of evaluation and management by telemedicine and the availability of in person appointments. The patient expressed understanding and agreed to proceed. Magdalene Molly, CMA was able to get the patient set up on a phone visit after being unable to set up a video visit.   Subjective:    Patient ID: Emily Mitchell, female    DOB: 18-Aug-1945, 73 y.o.   MRN: [id number]  No chief complaint on file.   HPI Patient is in today for chronic medical concerns including hypertension which has worsened recently. She is very tearful and worried about her high blood pressure. No recent febrile illness or hospitalizations. No polyuria or polydipsia. She notes increased muscle tension and neck pain but no injury. He systolic blood pressures have been ranging from 150 to 180s. She notes intermittent headaches and muslce tension in her neck as well. Denies CP/palp/SOB/congestion/fevers/GI or GU c/o. Taking meds as prescribed  Past Medical History:  Diagnosis Date  . Anemia 07/05/2014  . Anxiety and depression 12/14/2008   Qualifier: Diagnosis of  By: [name] LPN, Almyra Free    . Arthritis    "fingers, toes, knees, joints" (07/18/2018)  . Asthma   . Atypical chest pain 05/23/2015  . CAD (coronary artery disease)    a. [place] with cath 06/2018 s/p DES to LAD.  Marland Kitchen Carotid artery occlusion    a. R carotid occluded, 30-16% LICA.  Marland Kitchen CHF (congestive heart failure) ([place])   . Chicken pox as a child  . Chronic kidney disease    Bright's Disease at age 49   . COPD (chronic obstructive pulmonary disease) ([place])   . GERD (gastroesophageal reflux disease)   . H. pylori infection 10/19/2013  . Hepatitis 1970s   "don't know for sure which one  it was; think it was A" (07/18/2018)  . Hyperglycemia 03/14/2016  . Hyperlipidemia   . Hypertension   . Hypothyroidism   . Knee pain, bilateral 12/17/2012  . Left hip pain 05/23/2015  . Low back pain 09/19/2015  . Macular degeneration of both eyes 12/16/2015   Right eye is wet Left Eye is dry Shots every 11 to 12 weeks in right eye at opthamologist office  . Medicare annual wellness visit, subsequent 02/21/2015  . Mumps 73 yrs old  . Pneumonia    "at least once" (07/18/2018)  . Preventative health care 03/14/2016  . Pulmonary emboli ([place]) 05/26/2013  . Sleep apnea    "don't use CPAP anymore; dr said I didn't have to" (07/18/2018)  . Stroke [facility])    "been told I've had some strokes; didn't know I'd had them" (07/18/2018)  . Tremor   . UTI (lower urinary tract infection) 10/19/2013    Past Surgical History:  Procedure Laterality Date  . APPENDECTOMY  1984  . CARDIAC CATHETERIZATION  ~ 2006  . CAROTID ANGIOGRAPHY Left 08/20/2018   Procedure: CAROTID ANGIOGRAPHY;  Surgeon: Serafina Mitchell, MD;  Location: [facility];  Service: Cardiovascular;  Laterality: Left;  . CAROTID ENDARTERECTOMY Right 05/10/07   cea  . CATARACT EXTRACTION Left   . CATARACT EXTRACTION W/ INTRAOCULAR LENS  IMPLANT, BILATERAL Bilateral   . CORONARY ANGIOPLASTY WITH STENT PLACEMENT  07/18/2018  . CORONARY STENT INTERVENTION  N/A 07/18/2018   Procedure: CORONARY STENT INTERVENTION;  Surgeon: Lorretta Harp, MD;  Location: [place] [facility];  Service: Cardiovascular;  Laterality: N/A;  . INTRAVASCULAR PRESSURE WIRE/FFR STUDY N/A 07/18/2018   Procedure: INTRAVASCULAR PRESSURE WIRE/FFR STUDY;  Surgeon: Lorretta Harp, MD;  Location: [place] [facility];  Service: Cardiovascular;  Laterality: N/A;  . JOINT REPLACEMENT    . LEFT HEART CATH AND CORONARY ANGIOGRAPHY N/A 07/18/2018   Procedure: LEFT HEART CATH AND CORONARY ANGIOGRAPHY;  Surgeon: Lorretta Harp, MD;  Location: [facility];  Service:  Cardiovascular;  Laterality: N/A;  . MULTIPLE TOOTH EXTRACTIONS    . TOTAL KNEE ARTHROPLASTY Bilateral 2005 - 2011   right - left  . VAGINAL HYSTERECTOMY  1984  . WISDOM TOOTH EXTRACTION      Family History  Problem Relation Age of Onset  . Stroke Mother        mini stroke  . Kidney disease Mother   . Heart failure Mother   . Hypertension Mother   . Diabetes Sister        type 2  . Kidney disease Sister   . Allergic rhinitis Sister   . Heart attack Maternal Grandfather   . Hyperlipidemia Sister     Social History   Socioeconomic History  . Marital status: Divorced    Spouse name: Not on file  . Number of children: 2  . Years of education: Not on file  . Highest education level: Not on file  Occupational History  . Not on file  Tobacco Use  . Smoking status: Former Smoker    Packs/day: 2.00    Years: 30.00    Pack years: 60.00    Types: Cigarettes    Quit date: 12/03/1994    Years since quitting: 24.6  . Smokeless tobacco: Never Used  Substance and Sexual Activity  . Alcohol use: Yes    Comment: 07/18/2018 "1 drink q 1-2 months; if that"  . Drug use: Never  . Sexual activity: Not Currently    Comment: lives with son and friend, no dietary restrictions  Other Topics Concern  . Not on file  Social History Narrative   epworth sleepiness scale = 11 (06/15/2015)   Social Determinants of Health   Financial Resource Strain:   . Difficulty of Paying Living Expenses: Not on file  Food Insecurity:   . Worried About Charity fundraiser in the Last Year: Not on file  . Ran Out of Food in the Last Year: Not on file  Transportation Needs:   . Lack of Transportation (Medical): Not on file  . Lack of Transportation (Non-Medical): Not on file  Physical Activity:   . Days of Exercise per Week: Not on file  . Minutes of Exercise per Session: Not on file  Stress:   . Feeling of Stress : Not on file  Social Connections:   . Frequency of Communication with Friends and  Family: Not on file  . Frequency of Social Gatherings with Friends and Family: Not on file  . Attends Religious Services: Not on file  . Active Member of Clubs or Organizations: Not on file  . Attends Archivist Meetings: Not on file  . Marital Status: Not on file  Intimate Partner Violence:   . Fear of Current or Ex-Partner: Not on file  . Emotionally Abused: Not on file  . Physically Abused: Not on file  . Sexually Abused: Not on file    Outpatient  Medications Prior to Visit  Medication Sig Dispense Refill  . acetaminophen (TYLENOL) 500 MG tablet Take 500 mg by mouth every 6 (six) hours as needed for moderate pain.     Marland Kitchen albuterol (PROVENTIL HFA;VENTOLIN HFA) 108 (90 Base) MCG/ACT inhaler Inhale 2 puffs into the lungs every 6 (six) hours as needed for wheezing or shortness of breath.    Marland Kitchen aspirin EC 81 MG tablet Take 1 tablet (81 mg total) by mouth daily. 90 tablet 3  . budesonide-formoterol (SYMBICORT) 80-4.5 MCG/ACT inhaler Inhale 2 puffs into the lungs 2 (two) times daily. 1 Inhaler 5  . Cholecalciferol (D3 MAXIMUM STRENGTH) 5000 UNITS capsule Take 5,000 Units by mouth daily.    . Choline Fenofibrate (FENOFIBRIC ACID) 135 MG CPDR Take 1 capsule by mouth once daily 90 capsule 1  . clonazePAM (KLONOPIN) 1 MG tablet Take 1 tablet (1 mg total) by mouth 3 (three) times daily as needed. for anxiety 90 tablet 1  . clopidogrel (PLAVIX) 75 MG tablet Take 1 tablet (75 mg total) by mouth daily. 90 tablet 6  . cyclobenzaprine (FLEXERIL) 10 MG tablet Take 1 tablet (10 mg total) by mouth 2 (two) times daily as needed. 20 tablet 0  . ezetimibe (ZETIA) 10 MG tablet Take 1 tablet (10 mg total) by mouth daily. 90 tablet 1  . famotidine (PEPCID) 40 MG tablet Take 1 tablet (40 mg total) by mouth daily. 90 tablet 1  . FISH OIL-KRILL OIL PO Take 1,000 mg by mouth daily.     Marland Kitchen FLUoxetine (PROZAC) 20 MG tablet Take 1 tablet (20 mg total) by mouth daily. 90 tablet 0  . gabapentin (NEURONTIN) 100 MG  capsule Take 2 capsules (200 mg total) by mouth at bedtime. 180 capsule 1  . levothyroxine (SYNTHROID) 125 MCG tablet TAKE 1 TABLET BY MOUTH ONCE DAILY BEFORE BREAKFAST 90 tablet 1  . lidocaine (LIDODERM) 5 % Place 1 patch onto the skin daily. Remove & Discard patch within 12 hours or as directed by MD 15 patch 0  . montelukast (SINGULAIR) 10 MG tablet Take 1 tablet (10 mg total) by mouth at bedtime as needed. 90 tablet 3  . nitroGLYCERIN (NITROSTAT) 0.4 MG SL tablet Place 1 tablet (0.4 mg total) under the tongue every 5 (five) minutes as needed for chest pain. 90 tablet 3  . vitamin B-12 (CYANOCOBALAMIN) 500 MCG tablet Take 500 mcg by mouth daily.     Marland Kitchen losartan (COZAAR) 25 MG tablet Take 1.5-2 tablets (37.5-50 mg total) by mouth daily. 180 tablet 3   No facility-administered medications prior to visit.    Allergies  Allergen Reactions  . Niaspan [Niacin Er] Nausea And Vomiting and Swelling    Swelling in mouth  . Pantoprazole     Mouth sores  . Bupropion Other (See Comments)    Uncontrollable shakes  . Nitroglycerin     NOT allergic - cardiology has recommended she NOT use this due to h/o severe carotid disease as it may decrease cerebral perfusion  . Penicillins Hives    DID THE REACTION INVOLVE: Swelling of the face/tongue/throat, SOB, or low BP? Yes Sudden or severe rash/hives, skin peeling, or the inside of the mouth or nose? Unknown Did it require medical treatment? Unknown When did it last happen?[age]  If all above answers are "NO", may proceed with cephalosporin use.  . Statins   . Sulfonamide Derivatives Hives  . Apple Rash  . Banana Rash    Review of Systems  Constitutional: Positive for  malaise/fatigue. Negative for fever.  HENT: Negative for congestion.   Eyes: Negative for blurred vision.  Respiratory: Negative for shortness of breath.   Cardiovascular: Negative for chest pain, palpitations and leg swelling.  Gastrointestinal: Negative for abdominal pain, blood  in stool and nausea.  Genitourinary: Negative for dysuria and frequency.  Musculoskeletal: Positive for myalgias and neck pain. Negative for falls.  Skin: Negative for rash.  Neurological: Positive for headaches. Negative for dizziness and loss of consciousness.  Endo/Heme/Allergies: Negative for environmental allergies.  Psychiatric/Behavioral: Positive for depression. The patient is nervous/anxious.        Objective:    Physical Exam  There were no vitals taken for this visit. Wt Readings from Last 3 Encounters:  07/14/19 193 lb (87.5 kg)  04/28/19 190 lb (86.2 kg)  03/15/19 183 lb (83 kg)    Diabetic Foot Exam - Simple   No data filed     Lab Results  Component Value Date   WBC 7.1 07/14/2019   HGB 13.0 07/14/2019   HCT 39.2 07/14/2019   PLT 235.0 07/14/2019   GLUCOSE 74 07/14/2019   CHOL 181 07/14/2019   TRIG 220.0 (H) 07/14/2019   HDL 50.60 07/14/2019   LDLDIRECT 103.0 07/14/2019   LDLCALC 70 02/10/2019   ALT 14 07/14/2019   AST 20 07/14/2019   NA 142 07/14/2019   K 4.8 07/14/2019   CL 106 07/14/2019   CREATININE 1.88 (H) 07/14/2019   BUN 32 (H) 07/14/2019   CO2 28 07/14/2019   TSH 0.61 07/14/2019   INR 1.79 07/18/2018   HGBA1C 5.1 07/14/2019    Lab Results  Component Value Date   TSH 0.61 07/14/2019   Lab Results  Component Value Date   WBC 7.1 07/14/2019   HGB 13.0 07/14/2019   HCT 39.2 07/14/2019   MCV 92.6 07/14/2019   PLT 235.0 07/14/2019   Lab Results  Component Value Date   NA 142 07/14/2019   K 4.8 07/14/2019   CO2 28 07/14/2019   GLUCOSE 74 07/14/2019   BUN 32 (H) 07/14/2019   CREATININE 1.88 (H) 07/14/2019   BILITOT 0.4 07/14/2019   ALKPHOS 54 07/14/2019   AST 20 07/14/2019   ALT 14 07/14/2019   PROT 6.6 07/14/2019   ALBUMIN 4.2 07/14/2019   CALCIUM 9.7 07/14/2019   ANIONGAP 13 03/15/2019   GFR 26.35 (L) 07/14/2019   Lab Results  Component Value Date   CHOL 181 07/14/2019   Lab Results  Component Value Date   HDL  50.60 07/14/2019   Lab Results  Component Value Date   LDLCALC 70 02/10/2019   Lab Results  Component Value Date   TRIG 220.0 (H) 07/14/2019   Lab Results  Component Value Date   CHOLHDL 4 07/14/2019   Lab Results  Component Value Date   HGBA1C 5.1 07/14/2019       Assessment & Plan:   Problem List Items Addressed This Visit    Anxiety and depression    When she is getting very worked up and her blood pressure starts to rise she is encouraged to try taking one of her Clonazepams to see if that helps.reassess in 2 weeks.       CKD (chronic kidney disease), stage IV ([place])    Hydrate and monitor with cmp next week. She continues to follow with [facility].       Essential hypertension    Patient notes her blood pressure has been increasing systolics running between 150-180 and she is  feeling very stressed about this. She notes some headaches as well. Did not see an improvement when her Losartan was increased to 50 mg in am. Will drop to 25 mg and move dose to qhs. Then add Carvedilol 3.125 mg po bid, check cmp and vitals next week then follow up the week after. She will monitor vitals at home.       Relevant Medications   carvedilol (COREG) 3.125 MG tablet   losartan (COZAAR) 25 MG tablet   Hyperglycemia    hgba1c acceptable, minimize simple carbs. Increase exercise as tolerated.       Vitamin D deficiency    Supplement and monitor       Other Visit Diagnoses    Blood in stool    -  Primary   Relevant Orders   Ambulatory referral to Gastroenterology      I have changed Pearline Cables. Prouse's losartan. I am also having her start on carvedilol. Additionally, I am having her maintain her Cholecalciferol, vitamin B-12, acetaminophen, FISH OIL-KRILL OIL PO, aspirin EC, nitroGLYCERIN, albuterol, budesonide-formoterol, gabapentin, levothyroxine, lidocaine, clonazePAM, cyclobenzaprine, ezetimibe, montelukast, clopidogrel, famotidine, FLUoxetine, and Fenofibric Acid.  Meds  ordered this encounter  Medications  . carvedilol (COREG) 3.125 MG tablet    Sig: Take 1 tablet (3.125 mg total) by mouth 2 (two) times daily with a meal.    Dispense:  60 tablet    Refill:  3  . losartan (COZAAR) 25 MG tablet    Sig: Take 1 tablet (25 mg total) by mouth at bedtime.    Dispense:  180 tablet    Refill:  3     I discussed the assessment and treatment plan with the patient. The patient was provided an opportunity to ask questions and all were answered. The patient agreed with the plan and demonstrated an understanding of the instructions.   The patient was advised to call back or seek an in-person evaluation if the symptoms worsen or if the condition fails to improve as anticipated.  I provided 25 minutes of non-face-to-face time during this encounter.   Penni Homans, MD

## 2019-07-30 NOTE — Assessment & Plan Note (Signed)
Patient notes her blood pressure has been increasing systolics running between 150-180 and she is feeling very stressed about this. She notes some headaches as well. Did not see an improvement when her Losartan was increased to 50 mg in am. Will drop to 25 mg and move dose to qhs. Then add Carvedilol 3.125 mg po bid, check cmp and vitals next week then follow up the week after. She will monitor vitals at home.

## 2019-07-30 NOTE — Assessment & Plan Note (Signed)
Supplement and monitor 

## 2019-07-30 NOTE — Assessment & Plan Note (Signed)
hgba1c acceptable, minimize simple carbs. Increase exercise as tolerated.  

## 2019-07-30 NOTE — Assessment & Plan Note (Signed)
Encouraged moist heat and gentle stretching as tolerated. May try NSAIDs and prescription meds as directed and report if symptoms worsen or seek immediate care 

## 2019-07-30 NOTE — Assessment & Plan Note (Signed)
Hydrate and monitor with cmp next week. She continues to follow with [facility].

## 2019-07-30 NOTE — Assessment & Plan Note (Signed)
When she is getting very worked up and her blood pressure starts to rise she is encouraged to try taking one of her Clonazepams to see if that helps.reassess in 2 weeks.

## 2019-08-04 ENCOUNTER — Telehealth: Payer: Self-pay | Admitting: *Deleted

## 2019-08-04 NOTE — Telephone Encounter (Signed)
Copied from [place] [id number]. Topic: General - Other >> Jul 31, 2019  2:01 PM Keene Breath wrote: Reason for CRM: Patient called to schedule a virtual appt.  Office was closed.  Please call to schedule at [phone]

## 2019-08-05 ENCOUNTER — Other Ambulatory Visit: Payer: Self-pay | Admitting: Emergency Medicine

## 2019-08-05 DIAGNOSIS — Z961 Presence of intraocular lens: Secondary | ICD-10-CM | POA: Diagnosis not present

## 2019-08-05 DIAGNOSIS — H353211 Exudative age-related macular degeneration, right eye, with active choroidal neovascularization: Secondary | ICD-10-CM | POA: Diagnosis not present

## 2019-08-05 DIAGNOSIS — H353121 Nonexudative age-related macular degeneration, left eye, early dry stage: Secondary | ICD-10-CM | POA: Diagnosis not present

## 2019-08-05 DIAGNOSIS — N184 Chronic kidney disease, stage 4 (severe): Secondary | ICD-10-CM

## 2019-08-05 DIAGNOSIS — H43823 Vitreomacular adhesion, bilateral: Secondary | ICD-10-CM | POA: Diagnosis not present

## 2019-08-05 DIAGNOSIS — H43811 Vitreous degeneration, right eye: Secondary | ICD-10-CM | POA: Diagnosis not present

## 2019-08-05 DIAGNOSIS — H35443 Age-related reticular degeneration of retina, bilateral: Secondary | ICD-10-CM | POA: Diagnosis not present

## 2019-08-05 DIAGNOSIS — H35352 Cystoid macular degeneration, left eye: Secondary | ICD-10-CM | POA: Diagnosis not present

## 2019-08-05 DIAGNOSIS — H3554 Dystrophies primarily involving the retinal pigment epithelium: Secondary | ICD-10-CM | POA: Diagnosis not present

## 2019-08-05 DIAGNOSIS — H35362 Drusen (degenerative) of macula, left eye: Secondary | ICD-10-CM | POA: Diagnosis not present

## 2019-08-05 NOTE — Addendum Note (Signed)
Addended by: Caffie Pinto on: 08/05/2019 03:06 PM   Modules accepted: Orders

## 2019-08-05 NOTE — Progress Notes (Signed)
.  comp

## 2019-08-06 ENCOUNTER — Other Ambulatory Visit (INDEPENDENT_AMBULATORY_CARE_PROVIDER_SITE_OTHER): Payer: Medicare HMO

## 2019-08-06 ENCOUNTER — Other Ambulatory Visit: Payer: Self-pay

## 2019-08-06 ENCOUNTER — Ambulatory Visit (INDEPENDENT_AMBULATORY_CARE_PROVIDER_SITE_OTHER): Payer: Medicare HMO | Admitting: Family Medicine

## 2019-08-06 ENCOUNTER — Other Ambulatory Visit: Payer: Self-pay | Admitting: Family Medicine

## 2019-08-06 DIAGNOSIS — N184 Chronic kidney disease, stage 4 (severe): Secondary | ICD-10-CM

## 2019-08-06 DIAGNOSIS — I10 Essential (primary) hypertension: Secondary | ICD-10-CM

## 2019-08-06 DIAGNOSIS — N289 Disorder of kidney and ureter, unspecified: Secondary | ICD-10-CM

## 2019-08-06 LAB — COMPREHENSIVE METABOLIC PANEL
ALT: 13 U/L (ref 0–35)
AST: 17 U/L (ref 0–37)
Albumin: 3.9 g/dL (ref 3.5–5.2)
Alkaline Phosphatase: 44 U/L (ref 39–117)
BUN: 44 mg/dL — ABNORMAL HIGH (ref 6–23)
CO2: 29 mEq/L (ref 19–32)
Calcium: 9.5 mg/dL (ref 8.4–10.5)
Chloride: 107 mEq/L (ref 96–112)
Creatinine, Ser: 2.23 mg/dL — ABNORMAL HIGH (ref 0.40–1.20)
GFR: 21.64 mL/min — ABNORMAL LOW (ref >60.00)
Glucose, Bld: 54 mg/dL — ABNORMAL LOW (ref 70–99)
Potassium: 4.7 mEq/L (ref 3.5–5.1)
Sodium: 142 mEq/L (ref 135–145)
Total Bilirubin: 0.4 mg/dL (ref 0.2–1.2)
Total Protein: 6.1 g/dL (ref 6.0–8.3)

## 2019-08-06 MED ORDER — CARVEDILOL 6.25 MG PO TABS: 6.2500 mg | ORAL_TABLET | Freq: Two times a day (BID) | ORAL | 3 refills | Status: DC

## 2019-08-06 NOTE — Progress Notes (Signed)
Pt here for Blood pressure check per   Pt currently takes: Losartan 25 mg 1 at night                                Carvedilol 3.125 1 bid Pt reports compliance with medication.  BP today @ = 140/ 82 HR = 59  Pt advised per Dr Lorelei Pont  BP Readings from Last 3 Encounters:  07/14/19 (!) 152/90  04/28/19 140/70  03/15/19 136/79   Please have her see Dr B or myself in the next 4 weeks  Please jot down some home BP readings in the meantime

## 2019-08-07 ENCOUNTER — Other Ambulatory Visit: Payer: Self-pay | Admitting: Family Medicine

## 2019-08-07 NOTE — Telephone Encounter (Signed)
Pt called in to request to have Rx sent to Carvedilol sent to:   Emily Mitchell at [place], [place] Phone:  [phone]  Fax:  [phone]

## 2019-08-11 DIAGNOSIS — N184 Chronic kidney disease, stage 4 (severe): Secondary | ICD-10-CM | POA: Diagnosis not present

## 2019-08-17 ENCOUNTER — Other Ambulatory Visit: Payer: Self-pay | Admitting: Family Medicine

## 2019-08-17 ENCOUNTER — Other Ambulatory Visit: Payer: Self-pay | Admitting: Pulmonary Disease

## 2019-08-17 ENCOUNTER — Encounter: Payer: Self-pay | Admitting: Nephrology

## 2019-08-17 DIAGNOSIS — J449 Chronic obstructive pulmonary disease, unspecified: Secondary | ICD-10-CM

## 2019-08-18 ENCOUNTER — Other Ambulatory Visit: Payer: Self-pay | Admitting: Emergency Medicine

## 2019-08-18 DIAGNOSIS — N179 Acute kidney failure, unspecified: Secondary | ICD-10-CM | POA: Diagnosis not present

## 2019-08-18 DIAGNOSIS — N184 Chronic kidney disease, stage 4 (severe): Secondary | ICD-10-CM | POA: Diagnosis not present

## 2019-08-18 DIAGNOSIS — E785 Hyperlipidemia, unspecified: Secondary | ICD-10-CM | POA: Diagnosis not present

## 2019-08-18 DIAGNOSIS — D631 Anemia in chronic kidney disease: Secondary | ICD-10-CM | POA: Diagnosis not present

## 2019-08-18 DIAGNOSIS — I129 Hypertensive chronic kidney disease with stage 1 through stage 4 chronic kidney disease, or unspecified chronic kidney disease: Secondary | ICD-10-CM | POA: Diagnosis not present

## 2019-08-18 DIAGNOSIS — J449 Chronic obstructive pulmonary disease, unspecified: Secondary | ICD-10-CM

## 2019-08-18 DIAGNOSIS — R251 Tremor, unspecified: Secondary | ICD-10-CM | POA: Diagnosis not present

## 2019-08-18 DIAGNOSIS — I509 Heart failure, unspecified: Secondary | ICD-10-CM | POA: Diagnosis not present

## 2019-08-18 DIAGNOSIS — N2581 Secondary hyperparathyroidism of renal origin: Secondary | ICD-10-CM | POA: Diagnosis not present

## 2019-08-18 DIAGNOSIS — E669 Obesity, unspecified: Secondary | ICD-10-CM | POA: Diagnosis not present

## 2019-08-18 MED ORDER — BUDESONIDE-FORMOTEROL FUMARATE 80-4.5 MCG/ACT IN AERO: 2.0000 | INHALATION_SPRAY | Freq: Two times a day (BID) | RESPIRATORY_TRACT | 5 refills | Status: DC

## 2019-08-18 MED ORDER — FLUOXETINE HCL 20 MG PO TABS: 20.0000 mg | ORAL_TABLET | Freq: Every day | ORAL | 1 refills | Status: DC

## 2019-08-20 ENCOUNTER — Other Ambulatory Visit: Payer: Self-pay

## 2019-08-20 ENCOUNTER — Encounter: Payer: Self-pay | Admitting: Cardiology

## 2019-08-20 ENCOUNTER — Ambulatory Visit: Payer: Medicare HMO | Admitting: Cardiology

## 2019-08-20 VITALS — BP 124/58 | HR 79 | Ht 63.0 in | Wt 191.1 lb

## 2019-08-20 DIAGNOSIS — I2510 Atherosclerotic heart disease of native coronary artery without angina pectoris: Secondary | ICD-10-CM | POA: Diagnosis not present

## 2019-08-20 DIAGNOSIS — I10 Essential (primary) hypertension: Secondary | ICD-10-CM

## 2019-08-20 DIAGNOSIS — E782 Mixed hyperlipidemia: Secondary | ICD-10-CM | POA: Diagnosis not present

## 2019-08-20 DIAGNOSIS — N184 Chronic kidney disease, stage 4 (severe): Secondary | ICD-10-CM | POA: Diagnosis not present

## 2019-08-20 NOTE — Progress Notes (Signed)
Cardiology Office Note:    Date:  08/20/2019   ID:  Emily Mitchell, DOB [birth year], MRN [id number]  PCP:  Mosie Lukes, MD  Cardiologist:  Jenean Lindau, MD   Referring MD: Mosie Lukes, MD    ASSESSMENT:    1. Coronary artery disease involving native coronary artery of native heart without angina pectoris   2. Essential hypertension   3. Hyperlipidemia, mixed   4. CKD (chronic kidney disease), stage IV (HCC)    PLAN:    In order of problems listed above:  1. Coronary artery disease: Secondary prevention stressed to the patient.  Importance of compliance with diet and medication stressed and she vocalized understanding. 2. Essential hypertension: Blood pressure is stable 3. Mixed dyslipidemia: Lipids were reviewed.  Patient is considering statin therapy.  She tells me that her primary care physician is going to initiate her on a low-dose statin therapy and she is going to try it.  I told her to give me a call if she has any questions or concerns about it.  I told her that if she can tolerate it it will be beneficial to her. 4. She sees nephrologist for renal issues and also blood pressure management. 5. Patient will be seen in follow-up appointment in 6 months or earlier if the patient has any concerns    Medication Adjustments/Labs and Tests Ordered: Current medicines are reviewed at length with the patient today.  Concerns regarding medicines are outlined above.  No orders of the defined types were placed in this encounter.  No orders of the defined types were placed in this encounter.    No chief complaint on file.    History of Present Illness:    Emily Mitchell is a 74 y.o. female.  Patient has past medical history of coronary artery disease post stenting.  She denies any chest pain orthopnea or PND.  This appears to be stable from a cardiovascular standpoint.  Her blood pressure is stable.  She has advanced renal insufficiency.  She has history of  dyslipidemia and she tells me that she had taken statin therapy in the remote past and does not know what exactly is the problem with statins and she has told her primary care physician shared that she is willing to try it again.  Past Medical History:  Diagnosis Date  . Anemia 07/05/2014  . Anxiety and depression 12/14/2008   Qualifier: Diagnosis of  By: [name] LPN, Almyra Free    . Arthritis    "fingers, toes, knees, joints" (07/18/2018)  . Asthma   . Atypical chest pain 05/23/2015  . CAD (coronary artery disease)    a. [place] with cath 06/2018 s/p DES to LAD.  Marland Kitchen Carotid artery occlusion    a. R carotid occluded, 60-73% LICA.  Marland Kitchen CHF (congestive heart failure) ([place])   . Chicken pox as a child  . Chronic kidney disease    Bright's Disease at age 81   . COPD (chronic obstructive pulmonary disease) ([place])   . GERD (gastroesophageal reflux disease)   . H. pylori infection 10/19/2013  . Hepatitis 1970s   "don't know for sure which one it was; think it was A" (07/18/2018)  . Hyperglycemia 03/14/2016  . Hyperlipidemia   . Hypertension   . Hypothyroidism   . Knee pain, bilateral 12/17/2012  . Left hip pain 05/23/2015  . Low back pain 09/19/2015  . Macular degeneration of both eyes 12/16/2015   Right eye is wet Left Eye is  dry Shots every 11 to 12 weeks in right eye at opthamologist office  . Medicare annual wellness visit, subsequent 02/21/2015  . Mumps 74 yrs old  . Pneumonia    "at least once" (07/18/2018)  . Preventative health care 03/14/2016  . Pulmonary emboli ([place]) 05/26/2013  . Sleep apnea    "don't use CPAP anymore; dr said I didn't have to" (07/18/2018)  . Stroke [facility])    "been told I've had some strokes; didn't know I'd had them" (07/18/2018)  . Tremor   . UTI (lower urinary tract infection) 10/19/2013    Past Surgical History:  Procedure Laterality Date  . APPENDECTOMY  1984  . CARDIAC CATHETERIZATION  ~ 2006  . CAROTID ANGIOGRAPHY Left 08/20/2018   Procedure: CAROTID ANGIOGRAPHY;   Surgeon: Serafina Mitchell, MD;  Location: [place] [facility];  Service: Cardiovascular;  Laterality: Left;  . CAROTID ENDARTERECTOMY Right 05/10/07   cea  . CATARACT EXTRACTION Left   . CATARACT EXTRACTION W/ INTRAOCULAR LENS  IMPLANT, BILATERAL Bilateral   . CORONARY ANGIOPLASTY WITH STENT PLACEMENT  07/18/2018  . CORONARY STENT INTERVENTION N/A 07/18/2018   Procedure: CORONARY STENT INTERVENTION;  Surgeon: Lorretta Harp, MD;  Location: [place] [facility];  Service: Cardiovascular;  Laterality: N/A;  . INTRAVASCULAR PRESSURE WIRE/FFR STUDY N/A 07/18/2018   Procedure: INTRAVASCULAR PRESSURE WIRE/FFR STUDY;  Surgeon: Lorretta Harp, MD;  Location: [place] [facility];  Service: Cardiovascular;  Laterality: N/A;  . JOINT REPLACEMENT    . LEFT HEART CATH AND CORONARY ANGIOGRAPHY N/A 07/18/2018   Procedure: LEFT HEART CATH AND CORONARY ANGIOGRAPHY;  Surgeon: Lorretta Harp, MD;  Location: [place] [facility];  Service: Cardiovascular;  Laterality: N/A;  . MULTIPLE TOOTH EXTRACTIONS    . TOTAL KNEE ARTHROPLASTY Bilateral 2005 - 2011   right - left  . VAGINAL HYSTERECTOMY  1984  . WISDOM TOOTH EXTRACTION      Current Medications: Current Meds  Medication Sig  . acetaminophen (TYLENOL) 500 MG tablet Take 500 mg by mouth every 6 (six) hours as needed for moderate pain.   Marland Kitchen albuterol (PROVENTIL HFA;VENTOLIN HFA) 108 (90 Base) MCG/ACT inhaler Inhale 2 puffs into the lungs every 6 (six) hours as needed for wheezing or shortness of breath.  Marland Kitchen aspirin EC 81 MG tablet Take 1 tablet (81 mg total) by mouth daily.  . bevacizumab (AVASTIN) 400 MG/16ML SOLN   . budesonide-formoterol (SYMBICORT) 80-4.5 MCG/ACT inhaler Inhale 2 puffs into the lungs 2 (two) times daily.  . carvedilol (COREG) 6.25 MG tablet Take 1 tablet (6.25 mg total) by mouth 2 (two) times daily with a meal.  . Cholecalciferol (D3 MAXIMUM STRENGTH) 5000 UNITS capsule Take 5,000 Units by mouth daily.  . Choline Fenofibrate  (FENOFIBRIC ACID) 135 MG CPDR Take 1 capsule by mouth once daily  . clonazePAM (KLONOPIN) 1 MG tablet Take 1 tablet (1 mg total) by mouth 3 (three) times daily as needed. for anxiety  . clopidogrel (PLAVIX) 75 MG tablet Take 1 tablet (75 mg total) by mouth daily.  . cyclobenzaprine (FLEXERIL) 10 MG tablet Take 1 tablet (10 mg total) by mouth 2 (two) times daily as needed.  . ezetimibe (ZETIA) 10 MG tablet Take 1 tablet (10 mg total) by mouth daily.  . famotidine (PEPCID) 40 MG tablet Take 1 tablet (40 mg total) by mouth daily.  Marland Kitchen FISH OIL-KRILL OIL PO Take 1,000 mg by mouth daily.   Marland Kitchen FLUoxetine (PROZAC) 20 MG tablet Take 1 tablet (20 mg  total) by mouth daily.  Marland Kitchen gabapentin (NEURONTIN) 100 MG capsule Take 2 capsules (200 mg total) by mouth at bedtime.  Marland Kitchen levothyroxine (SYNTHROID) 125 MCG tablet TAKE 1 TABLET BY MOUTH ONCE DAILY BEFORE BREAKFAST  . lidocaine (LIDODERM) 5 % Place 1 patch onto the skin daily. Remove & Discard patch within 12 hours or as directed by MD  . losartan (COZAAR) 25 MG tablet 50 mg daily.  . montelukast (SINGULAIR) 10 MG tablet Take 1 tablet (10 mg total) by mouth at bedtime as needed.  . Multiple Vitamins-Minerals (KP VISION FORMULA) TABS daily.  . vitamin B-12 (CYANOCOBALAMIN) 500 MCG tablet Take 500 mcg by mouth daily.      Allergies:   Niaspan [niacin er], Pantoprazole, Bupropion, Nitroglycerin, Penicillins, Statins, Sulfonamide derivatives, Apple, and Banana   Social History   Socioeconomic History  . Marital status: Divorced    Spouse name: Not on file  . Number of children: 2  . Years of education: Not on file  . Highest education level: Not on file  Occupational History  . Not on file  Tobacco Use  . Smoking status: Former Smoker    Packs/day: 2.00    Years: 30.00    Pack years: 60.00    Types: Cigarettes    Quit date: 12/03/1994    Years since quitting: 24.7  . Smokeless tobacco: Never Used  Substance and Sexual Activity  . Alcohol use: Yes     Comment: 07/18/2018 "1 drink q 1-2 months; if that"  . Drug use: Never  . Sexual activity: Not Currently    Comment: lives with son and friend, no dietary restrictions  Other Topics Concern  . Not on file  Social History Narrative   epworth sleepiness scale = 11 (06/15/2015)   Social Determinants of Health   Financial Resource Strain:   . Difficulty of Paying Living Expenses: Not on file  Food Insecurity:   . Worried About Charity fundraiser in the Last Year: Not on file  . Ran Out of Food in the Last Year: Not on file  Transportation Needs:   . Lack of Transportation (Medical): Not on file  . Lack of Transportation (Non-Medical): Not on file  Physical Activity:   . Days of Exercise per Week: Not on file  . Minutes of Exercise per Session: Not on file  Stress:   . Feeling of Stress : Not on file  Social Connections:   . Frequency of Communication with Friends and Family: Not on file  . Frequency of Social Gatherings with Friends and Family: Not on file  . Attends Religious Services: Not on file  . Active Member of Clubs or Organizations: Not on file  . Attends Archivist Meetings: Not on file  . Marital Status: Not on file     Family History: The patient's family history includes Allergic rhinitis in her sister; Diabetes in her sister; Heart attack in her maternal grandfather; Heart failure in her mother; Hyperlipidemia in her sister; Hypertension in her mother; Kidney disease in her mother and sister; Stroke in her mother.  ROS:   Please see the history of present illness.    All other systems reviewed and are negative.  EKGs/Labs/Other Studies Reviewed:    The following studies were reviewed today: Coronary angiography report was discussed with the patient from last evaluation   Recent Labs: 09/04/2018: B Natriuretic Peptide 38.9 03/15/2019: Magnesium 2.1 07/14/2019: Hemoglobin 13.0; Platelets 235.0; TSH 0.61 08/06/2019: ALT 13; BUN 44; Creatinine, Ser  2.23;  Potassium 4.7; Sodium 142  Recent Lipid Panel    Component Value Date/Time   CHOL 181 07/14/2019 1323   TRIG 220.0 (H) 07/14/2019 1323   HDL 50.60 07/14/2019 1323   CHOLHDL 4 07/14/2019 1323   VLDL 44.0 (H) 07/14/2019 1323   LDLCALC 70 02/10/2019 1341   LDLDIRECT 103.0 07/14/2019 1323    Physical Exam:    VS:  BP (!) 124/58 (BP Location: Left Arm, Patient Position: Sitting, Cuff Size: Normal)   Pulse 79   Ht 5\' 3"  (1.6 m)   Wt 191 lb 1.3 oz (86.7 kg)   SpO2 96%   BMI 33.85 kg/m     Wt Readings from Last 3 Encounters:  08/20/19 191 lb 1.3 oz (86.7 kg)  07/14/19 193 lb (87.5 kg)  04/28/19 190 lb (86.2 kg)     GEN: Patient is in no acute distress HEENT: Normal NECK: No JVD; No carotid bruits LYMPHATICS: No lymphadenopathy CARDIAC: Hear sounds regular, 2/6 systolic murmur at the apex. RESPIRATORY:  Clear to auscultation without rales, wheezing or rhonchi  ABDOMEN: Soft, non-tender, non-distended MUSCULOSKELETAL:  No edema; No deformity  SKIN: Warm and dry NEUROLOGIC:  Alert and oriented x 3 PSYCHIATRIC:  Normal affect   Signed, Jenean Lindau, MD  08/20/2019 2:30 PM    [place]

## 2019-08-20 NOTE — Patient Instructions (Signed)

## 2019-08-25 ENCOUNTER — Other Ambulatory Visit: Payer: Self-pay

## 2019-08-25 ENCOUNTER — Telehealth (INDEPENDENT_AMBULATORY_CARE_PROVIDER_SITE_OTHER): Payer: Medicare HMO | Admitting: Gastroenterology

## 2019-08-25 ENCOUNTER — Telehealth: Payer: Self-pay

## 2019-08-25 DIAGNOSIS — Z01818 Encounter for other preprocedural examination: Secondary | ICD-10-CM | POA: Diagnosis not present

## 2019-08-25 DIAGNOSIS — Z7902 Long term (current) use of antithrombotics/antiplatelets: Secondary | ICD-10-CM | POA: Diagnosis not present

## 2019-08-25 DIAGNOSIS — Z7982 Long term (current) use of aspirin: Secondary | ICD-10-CM

## 2019-08-25 DIAGNOSIS — R195 Other fecal abnormalities: Secondary | ICD-10-CM | POA: Diagnosis not present

## 2019-08-25 NOTE — Patient Instructions (Signed)
If you are age 72 or older, your body mass index should be between 23-30. Your There is no height or weight on file to calculate BMI. If this is out of the aforementioned range listed, please consider follow up with your Primary Care Provider.  If you are age 105 or younger, your body mass index should be between 19-25. Your There is no height or weight on file to calculate BMI. If this is out of the aformentioned range listed, please consider follow up with your Primary Care Provider.   You have been scheduled for a colonoscopy. Please follow written instructions given to you at your visit today.  Please pick up your prep supplies at the pharmacy within the next 1-3 days. If you use inhalers (even only as needed), please bring them with you on the day of your procedure. Your physician has requested that you go to www.startemmi.com and enter the access code given to you at your visit today. This web site gives a general overview about your procedure. However, you should still follow specific instructions given to you by our office regarding your preparation for the procedure.  You will be contacted by our office prior to your procedure for directions on holding your Plavix.  If you do not hear from our office 1 week prior to your scheduled procedure, please call [phone] to discuss.   Thank you,  Dr. Jackquline Denmark

## 2019-08-25 NOTE — Telephone Encounter (Signed)
[facility] Pre-operative Risk Assessment     Request for surgical clearance:     Endoscopy Procedure  What type of surgery is being performed?     Colon  When is this surgery scheduled?     09/12/19  What type of clearance is required ?   Pharmacy  Are there any medications that need to be held prior to surgery and how long? Plavix 5 days   Practice name and name of physician performing surgery?      [facility]  What is your office phone and fax number?      Phone- [phone]  [phone]  Anesthesia type (None, local, MAC, general) ?       MAC

## 2019-08-25 NOTE — Telephone Encounter (Signed)
Dr. Geraldo Pitter, this pt has hx of DES to mid LAD in 06/2018. Last seen in the office by you on 08/20/2019.   She is planned for colonoscopy. Can Plavix be held for 5 days?  Please route response back to P CV DIV PREOP  Thanks, Gae Bon

## 2019-08-25 NOTE — Progress Notes (Signed)
Chief Complaint:   Referring Provider:  Mosie Lukes, MD      ASSESSMENT AND PLAN;   #1. Heme positive stools.   #2. Comorbid conditions include CAD on Plavix/ASA, CKD3, PAD/PVD, OSA  Plan: - Proceed with colonoscopy with Miralax wants early morning.. Discussed risks & benefits. (Risks including rare perforation req laparotomy, bleeding after bx/polypectomy req blood transfusion, rarely missing neoplasms, risks of anesthesia/sedation). Benefits outweigh the risks. Patient agrees to proceed. All the questions were answered. Consent forms given for review. -Cardiology clearence for colon and to hold plavix 5 days before. Can continue ASA for now.   HPI:    Emily Mitchell is a 74 y.o. female  Positive hemooccult stools.  Advised colonoscopy. Hb 13.0 as below.  Occ constipation.  No obvious rectal bleeding.   No nausea, vomiting, heartburn, regurgitation, odynophagia or dysphagia.  No significant diarrhea.  No melena or hematochezia. No unintentional weight loss. No abdominal pain.  No nonsteroidals except baby aspirin.   Colon 08/2008: neg per pt. - had a bad experience in [place] as she woke up. EF 60-65% 2D echo 06/2018 S/P DES placement to LAD 06/2018 Past Medical History:  Diagnosis Date  . Anemia 07/05/2014  . Anxiety and depression 12/14/2008   Qualifier: Diagnosis of  By: [name] LPN, Almyra Free    . Arthritis    "fingers, toes, knees, joints" (07/18/2018)  . Asthma   . Atypical chest pain 05/23/2015  . CAD (coronary artery disease)    a. [place] with cath 06/2018 s/p DES to LAD.  Marland Kitchen Carotid artery occlusion    a. R carotid occluded, 27-25% LICA.  Marland Kitchen CHF (congestive heart failure) ([place]) EF: 60 to 65% 06/2018   . Chicken pox as a child  . Chronic kidney disease    Bright's Disease at age 42   . COPD (chronic obstructive pulmonary disease) ([place])   . GERD (gastroesophageal reflux disease)   . H. pylori infection 10/19/2013  . Hepatitis 1970s   "don't know for sure  which one it was; think it was A" (07/18/2018)  . Hyperglycemia 03/14/2016  . Hyperlipidemia   . Hypertension   . Hypothyroidism   . Knee pain, bilateral 12/17/2012  . Left hip pain 05/23/2015  . Low back pain 09/19/2015  . Macular degeneration of both eyes 12/16/2015   Right eye is wet Left Eye is dry Shots every 11 to 12 weeks in right eye at opthamologist office  . Medicare annual wellness visit, subsequent 02/21/2015  . Mumps 74 yrs old  . Pneumonia    "at least once" (07/18/2018)  . Preventative health care 03/14/2016  . Pulmonary emboli ([place]) 05/26/2013  . Sleep apnea    "don't use CPAP anymore; dr said I didn't have to" (07/18/2018)  . Stroke [facility])    "been told I've had some strokes; didn't know I'd had them" (07/18/2018)  . Tremor   . UTI (lower urinary tract infection) 10/19/2013    Past Surgical History:  Procedure Laterality Date  . APPENDECTOMY  1984  . CARDIAC CATHETERIZATION  ~ 2006  . CAROTID ANGIOGRAPHY Left 08/20/2018   Procedure: CAROTID ANGIOGRAPHY;  Surgeon: Serafina Mitchell, MD;  Location: [place] [facility];  Service: Cardiovascular;  Laterality: Left;  . CAROTID ENDARTERECTOMY Right 05/10/07   cea  . CATARACT EXTRACTION Left   . CATARACT EXTRACTION W/ INTRAOCULAR LENS  IMPLANT, BILATERAL Bilateral   . CORONARY ANGIOPLASTY WITH STENT PLACEMENT  07/18/2018  . CORONARY STENT INTERVENTION N/A 07/18/2018  Procedure: CORONARY STENT INTERVENTION;  Surgeon: Lorretta Harp, MD;  Location: [place] [facility];  Service: Cardiovascular;  Laterality: N/A;  . INTRAVASCULAR PRESSURE WIRE/FFR STUDY N/A 07/18/2018   Procedure: INTRAVASCULAR PRESSURE WIRE/FFR STUDY;  Surgeon: Lorretta Harp, MD;  Location: [place] [facility];  Service: Cardiovascular;  Laterality: N/A;  . JOINT REPLACEMENT    . LEFT HEART CATH AND CORONARY ANGIOGRAPHY N/A 07/18/2018   Procedure: LEFT HEART CATH AND CORONARY ANGIOGRAPHY;  Surgeon: Lorretta Harp, MD;  Location: [place] [facility];   Service: Cardiovascular;  Laterality: N/A;  . MULTIPLE TOOTH EXTRACTIONS    . TOTAL KNEE ARTHROPLASTY Bilateral 2005 - 2011   right - left  . VAGINAL HYSTERECTOMY  1984  . WISDOM TOOTH EXTRACTION      Family History  Problem Relation Age of Onset  . Stroke Mother        mini stroke  . Kidney disease Mother   . Heart failure Mother   . Hypertension Mother   . Diabetes Sister        type 2  . Kidney disease Sister   . Allergic rhinitis Sister   . Heart attack Maternal Grandfather   . Hyperlipidemia Sister   . Colon cancer Neg Hx     Social History   Tobacco Use  . Smoking status: Former Smoker    Packs/day: 2.00    Years: 30.00    Pack years: 60.00    Types: Cigarettes    Quit date: 12/03/1994    Years since quitting: 24.7  . Smokeless tobacco: Never Used  Substance Use Topics  . Alcohol use: Yes    Comment: 07/18/2018 "1 drink q 1-2 months; if that"  . Drug use: Never    Current Outpatient Medications  Medication Sig Dispense Refill  . acetaminophen (TYLENOL) 500 MG tablet Take 500 mg by mouth every 6 (six) hours as needed for moderate pain.     Marland Kitchen albuterol (PROVENTIL HFA;VENTOLIN HFA) 108 (90 Base) MCG/ACT inhaler Inhale 2 puffs into the lungs every 6 (six) hours as needed for wheezing or shortness of breath.    Marland Kitchen aspirin EC 81 MG tablet Take 1 tablet (81 mg total) by mouth daily. 90 tablet 3  . bevacizumab (AVASTIN) 400 MG/16ML SOLN     . budesonide-formoterol (SYMBICORT) 80-4.5 MCG/ACT inhaler Inhale 2 puffs into the lungs 2 (two) times daily. 1 Inhaler 5  . carvedilol (COREG) 6.25 MG tablet Take 1 tablet (6.25 mg total) by mouth 2 (two) times daily with a meal. 60 tablet 3  . Cholecalciferol (D3 MAXIMUM STRENGTH) 5000 UNITS capsule Take 5,000 Units by mouth daily.    . Choline Fenofibrate (FENOFIBRIC ACID) 135 MG CPDR Take 1 capsule by mouth once daily 90 capsule 1  . clonazePAM (KLONOPIN) 1 MG tablet Take 1 tablet (1 mg total) by mouth 3 (three) times daily as  needed. for anxiety 90 tablet 1  . clopidogrel (PLAVIX) 75 MG tablet Take 1 tablet (75 mg total) by mouth daily. 90 tablet 6  . cyclobenzaprine (FLEXERIL) 10 MG tablet Take 1 tablet (10 mg total) by mouth 2 (two) times daily as needed. 20 tablet 0  . ezetimibe (ZETIA) 10 MG tablet Take 1 tablet (10 mg total) by mouth daily. 90 tablet 1  . famotidine (PEPCID) 40 MG tablet Take 1 tablet (40 mg total) by mouth daily. 90 tablet 1  . FISH OIL-KRILL OIL PO Take 1,000 mg by mouth daily.     Marland Kitchen  FLUoxetine (PROZAC) 20 MG tablet Take 1 tablet (20 mg total) by mouth daily. 90 tablet 1  . gabapentin (NEURONTIN) 100 MG capsule Take 2 capsules (200 mg total) by mouth at bedtime. 180 capsule 1  . levothyroxine (SYNTHROID) 125 MCG tablet TAKE 1 TABLET BY MOUTH ONCE DAILY BEFORE BREAKFAST 90 tablet 1  . lidocaine (LIDODERM) 5 % Place 1 patch onto the skin daily. Remove & Discard patch within 12 hours or as directed by MD 15 patch 0  . losartan (COZAAR) 25 MG tablet 50 mg daily.    . montelukast (SINGULAIR) 10 MG tablet Take 1 tablet (10 mg total) by mouth at bedtime as needed. 90 tablet 3  . Multiple Vitamins-Minerals (KP VISION FORMULA) TABS daily.    . nitroGLYCERIN (NITROSTAT) 0.4 MG SL tablet Place 1 tablet (0.4 mg total) under the tongue every 5 (five) minutes as needed for chest pain. 90 tablet 3  . vitamin B-12 (CYANOCOBALAMIN) 500 MCG tablet Take 500 mcg by mouth daily.      No current facility-administered medications for this visit.    Allergies  Allergen Reactions  . Niaspan [Niacin Er] Nausea And Vomiting and Swelling    Swelling in mouth  . Pantoprazole     Mouth sores  . Bupropion Other (See Comments)    Uncontrollable shakes  . Nitroglycerin     NOT allergic - cardiology has recommended she NOT use this due to h/o severe carotid disease as it may decrease cerebral perfusion  . Penicillins Hives    DID THE REACTION INVOLVE: Swelling of the face/tongue/throat, SOB, or low BP? Yes Sudden or  severe rash/hives, skin peeling, or the inside of the mouth or nose? Unknown Did it require medical treatment? Unknown When did it last happen?[age]  If all above answers are "NO", may proceed with cephalosporin use.  . Statins   . Sulfonamide Derivatives Hives  . Apple Rash  . Banana Rash    Review of Systems:  Constitutional: Denies fever, chills, diaphoresis, appetite change and fatigue.  HEENT: Denies photophobia, eye pain, redness, hearing loss, ear pain, congestion, sore throat, rhinorrhea, sneezing, mouth sores, neck pain, neck stiffness and tinnitus.   Respiratory: Denies SOB, DOE, cough, chest tightness,  and wheezing.   Cardiovascular: Denies chest pain, palpitations and leg swelling.  Genitourinary: Denies dysuria, urgency, frequency, hematuria, flank pain and difficulty urinating.  Musculoskeletal: Denies myalgias, back pain, joint swelling, arthralgias and gait problem.  Skin: No rash.  Neurological: Denies dizziness, seizures, syncope, weakness, light-headedness, numbness and headaches.  Hematological: Denies adenopathy. Easy bruising, personal or family bleeding history  Psychiatric/Behavioral: No anxiety or depression     Physical Exam:    There were no vitals taken for this visit. Wt Readings from Last 3 Encounters:  08/20/19 191 lb 1.3 oz (86.7 kg)  07/14/19 193 lb (87.5 kg)  04/28/19 190 lb (86.2 kg)   Not examined since it was a televisit.  Data Reviewed: I have personally reviewed following labs and imaging studies  CBC: CBC Latest Ref Rng & Units 07/14/2019 03/15/2019 02/10/2019  WBC 4.0 - 10.5 K/uL 7.1 7.9 5.8  Hemoglobin 12.0 - 15.0 g/dL 13.0 14.5 12.4  Hematocrit 36.0 - 46.0 % 39.2 44.0 37.1  Platelets 150.0 - 400.0 K/uL 235.0 265 229.0    CMP: CMP Latest Ref Rng & Units 08/06/2019 07/14/2019 03/15/2019  Glucose 70 - 99 mg/dL 54(L) 74 90  BUN 6 - 23 mg/dL 44(H) 32(H) 26(H)  Creatinine 0.40 - 1.20 mg/dL 2.23(H) 1.88(H)  1.81(H)  Sodium 135 - 145  mEq/L 142 142 140  Potassium 3.5 - 5.1 mEq/L 4.7 4.8 3.8  Chloride 96 - 112 mEq/L 107 106 104  CO2 19 - 32 mEq/L 29 28 23   Calcium 8.4 - 10.5 mg/dL 9.5 9.7 10.2  Total Protein 6.0 - 8.3 g/dL 6.1 6.6 7.4  Total Bilirubin 0.2 - 1.2 mg/dL 0.4 0.4 0.6  Alkaline Phos 39 - 117 U/L 44 54 56  AST 0 - 37 U/L 17 20 27   ALT 0 - 35 U/L 13 14 20       Carmell Austria, MD 08/25/2019, 3:21 PM  Cc: Mosie Lukes, MD

## 2019-08-26 NOTE — Telephone Encounter (Signed)
   Primary Cardiologist: Jenean Lindau, MD  Chart reviewed as part of pre-operative protocol coverage. Per request, Plavix can be held for 5 days for procedure.   Regarding ASA therapy, we recommend continuation of ASA throughout the perioperative period.  However, if the surgeon feels that cessation of ASA is required in the perioperative period, it may be stopped 5-7 days prior to surgery with a plan to resume it as soon as felt to be feasible from a surgical standpoint in the post-operative period.  I will route this recommendation to the requesting party via Epic fax function and remove from pre-op pool.  Please call with questions.  Daune Perch, NP 08/26/2019, 1:35 PM

## 2019-08-26 NOTE — Telephone Encounter (Signed)
Yes if her stent was more than 1 year ago certainly she can stop the Plavix and continue the aspirin.

## 2019-08-28 ENCOUNTER — Encounter: Payer: Self-pay | Admitting: Pulmonary Disease

## 2019-08-28 ENCOUNTER — Other Ambulatory Visit: Payer: Self-pay

## 2019-08-28 ENCOUNTER — Ambulatory Visit (INDEPENDENT_AMBULATORY_CARE_PROVIDER_SITE_OTHER): Payer: Medicare HMO | Admitting: Pulmonary Disease

## 2019-08-28 DIAGNOSIS — I2782 Chronic pulmonary embolism: Secondary | ICD-10-CM | POA: Diagnosis not present

## 2019-08-28 DIAGNOSIS — J449 Chronic obstructive pulmonary disease, unspecified: Secondary | ICD-10-CM | POA: Diagnosis not present

## 2019-08-28 NOTE — Assessment & Plan Note (Signed)
Appears worse, not sure if this is primarily COPD related or cardiac based on televisit. I offered her step up therapy from Symbicort to triple therapy such as Trelegy -after much discussion, she decided to hold off for now and will call us if breathing is worse in the future Chest x-ray from 03/2019 reviewed which is clear without infiltrates

## 2019-08-28 NOTE — Telephone Encounter (Signed)
Patient was made aware to stop Plavix 5 days prior to procedure and patient understood this

## 2019-08-28 NOTE — Progress Notes (Signed)
I connected with  Emily Mitchell on 08/28/19 by phone and verified that I am speaking with the correct person using two identifiers.   I discussed the limitations of evaluation and management by telemedicine. The patient expressed understanding and agreed to proceed.   74 yo ex smoker followed for COPD with asthma PMH - PE  01/2013 - on Coumadin until 02/2018 OSA was very mild in 2015 and she lost significant weight and came off CPAP.  Reviewed last office note, GI note, colonoscopy scheduled for next week Cardiology office note reviewed from 08/20/2019. She is holding off on Covid vaccination until testing prior to colonoscopy.  Breathing appears to be slightly worse she is wondering if there is any medication better than Symbicort She does have albuterol and nebs but has hardly used this  No leg swelling no orthopnea paroxysmal nocturnal dyspnea    Significant tests/ events  PSG at [facility] 08/2011 -217 lbs -AHI 4/h, TST 319 mins,supine   PSG 07/2013 -208 lbs -showed mild OSA TST - 390 mins, AHI 6/h, RDI of 8/h The lowest desaturation was 87%    Spirometry 04/2013: FEV1 72% predicted FEV1 FVC ratio 73 Spirometry  10/2017  severe airway obstruction with ratio 60, FEV1 of 41% and FVC of 52%  Echo 08/2014 grade 2 DD  12/2014 ONO - no desatn >> dc O2    neg for any significant sore throat, dysphagia, itching, sneezing, nasal congestion or excess/ purulent secretions, fever, chills, sweats, unintended wt loss, pleuritic or exertional cp, hempoptysis, orthopnea pnd or change in chronic leg swelling. Also denies presyncope, palpitations, heartburn, abdominal pain, nausea, vomiting, diarrhea or change in bowel or urinary habits, dysuria,hematuria, rash, arthralgias, visual complaints, headache, numbness weakness or ataxia.   Encounter time x 82m

## 2019-08-28 NOTE — Assessment & Plan Note (Signed)
Off Coumadin now

## 2019-09-01 ENCOUNTER — Encounter: Payer: Self-pay | Admitting: Gastroenterology

## 2019-09-01 DIAGNOSIS — I10 Essential (primary) hypertension: Secondary | ICD-10-CM | POA: Diagnosis not present

## 2019-09-02 ENCOUNTER — Other Ambulatory Visit: Payer: Self-pay | Admitting: Family Medicine

## 2019-09-02 NOTE — Telephone Encounter (Signed)
Medication: gabapentin (NEURONTIN) 100 MG capsule [[id number]]    levothyroxine (SYNTHROID) 125 MCG tablet [[id number]]     Has the patient contacted their pharmacy? Yes.   [place], [place]      Agent: Please be advised that RX refills may take up to 3 business days. We ask that you follow-up with your pharmacy.

## 2019-09-03 ENCOUNTER — Other Ambulatory Visit: Payer: Self-pay

## 2019-09-03 NOTE — Progress Notes (Signed)
[place] [facility] [place], [place], [place] [address] [phone]  Date:  09/04/2019   Name:  Emily Mitchell   DOB:  [birth year]   MRN:  [id number]  PCP:  Mosie Lukes, MD    Chief Complaint: Hypertension   History of Present Illness:  Emily Mitchell is a 74 y.o. very pleasant female patient who presents with the following:  Regular patient of my partner Dr. Charlett Blake here today for short-term follow-up Last seen by myself in December with concern of elevated blood pressure History of sleep apnea, hypertension, hyperlipidemia, hypothyroidism, COPD, CHF, carotid disease, chronic renal insufficiency She is a patient at [facility]- Dr Burman Foster   At her last visit with me we increased her losartan to 37.5 mg  However in the interim Dr B stopped her losartan and increased her carvedilol to 6.25 BID due to worsening renal function She brings in an extensive list of blood pressure readings.  She is sometimes normal, but often her systolic pressures are in the 140s to 150s.  Diastolics typically 24M to 15s  Colon cancer screening-colonoscopy scheduled for 10 days from now  Flu shot up-to-date Most recent labs on chart from January  She also states today that her left side feels achy, she is not sure why this might be.  No chest pain or shortness of breath, no fever  Patient states that she has chronic left hip pain, she uses tramadol for this on occasion.  She apparently filled this about 2 years ago, did not need another refill until now.  Her chart confirmed/she wonders if I can refill her tramadol Patient Active Problem List   Diagnosis Date Noted  . Breast mass, left 04/28/2019  . Hypercalcemia 03/09/2019  . Hoarseness 03/09/2019  . Back pain 02/02/2019  . History of food allergy 08/14/2018  . CAD (coronary artery disease) 07/25/2018  . CAD in native artery 07/19/2018  . Chest pain 07/18/2018  . History of  pulmonary embolism 07/17/2018  . Renal insufficiency 07/17/2018  . H/O multiple allergies 07/14/2018  . Chronic rhinitis 06/14/2018  . Vitamin D deficiency 03/03/2018  . Candidal skin infection 02/28/2018  . COPD with asthma ([place]) 11/06/2017  . Lymphadenopathy 10/18/2017  . Right shoulder pain 10/18/2017  . Neck pain 04/22/2017  . Hyperglycemia 03/14/2016  . Preventative health care 03/14/2016  . Macular degeneration of both eyes 12/16/2015  . Essential hypertension 10/18/2015  . Exudative age-related macular degeneration of right eye with active choroidal neovascularization ([place]) 08/26/2015  . Carotid artery disease ([place]) 06/16/2015  . Left hip pain 05/23/2015  . Medicare annual wellness visit, subsequent 02/21/2015  . Anemia 07/05/2014  . Essential and other specified forms of tremor 11/05/2013  . Lower urinary tract infectious disease 10/19/2013  . Hyperlipidemia, mixed 05/26/2013  . Pulmonary emboli ([place]) 05/26/2013  . Arthritis   . CKD (chronic kidney disease), stage IV ([place])   . Hypothyroidism   . CHF (congestive heart failure) ([place])   . Occlusion and stenosis of carotid artery without mention of cerebral infarction 12/17/2012  . Knee pain, bilateral 12/17/2012  . Numbness of finger 12/17/2012  . Constipation 12/17/2008  . Anxiety and depression 12/14/2008  . OSA (obstructive sleep apnea) 12/14/2008    Past Medical History:  Diagnosis Date  . Anemia 07/05/2014  . Anxiety and depression 12/14/2008   Qualifier: Diagnosis of  By: [name] LPN, Almyra Free    . Arthritis    "  fingers, toes, knees, joints" (07/18/2018)  . Asthma   . Atypical chest pain 05/23/2015  . CAD (coronary artery disease)    a. [place] with cath 06/2018 s/p DES to LAD.  Marland Kitchen Carotid artery occlusion    a. R carotid occluded, 37-10% LICA.  Marland Kitchen CHF (congestive heart failure) ([place])   . Chicken pox as a child  . Chronic kidney disease    Bright's Disease at age 45   . COPD (chronic obstructive pulmonary disease)  ([place])   . GERD (gastroesophageal reflux disease)   . H. pylori infection 10/19/2013  . Hepatitis 1970s   "don't know for sure which one it was; think it was A" (07/18/2018)  . Hyperglycemia 03/14/2016  . Hyperlipidemia   . Hypertension   . Hypothyroidism   . Knee pain, bilateral 12/17/2012  . Left hip pain 05/23/2015  . Low back pain 09/19/2015  . Macular degeneration of both eyes 12/16/2015   Right eye is wet Left Eye is dry Shots every 11 to 12 weeks in right eye at opthamologist office  . Medicare annual wellness visit, subsequent 02/21/2015  . Mumps 74 yrs old  . Pneumonia    "at least once" (07/18/2018)  . Preventative health care 03/14/2016  . Pulmonary emboli ([place]) 05/26/2013  . Sleep apnea    "don't use CPAP anymore; dr said I didn't have to" (07/18/2018)  . Stroke [facility])    "been told I've had some strokes; didn't know I'd had them" (07/18/2018)  . Tremor   . UTI (lower urinary tract infection) 10/19/2013    Past Surgical History:  Procedure Laterality Date  . APPENDECTOMY  1984  . CARDIAC CATHETERIZATION  ~ 2006  . CAROTID ANGIOGRAPHY Left 08/20/2018   Procedure: CAROTID ANGIOGRAPHY;  Surgeon: Serafina Mitchell, MD;  Location: [facility];  Service: Cardiovascular;  Laterality: Left;  . CAROTID ENDARTERECTOMY Right 05/10/07   cea  . CATARACT EXTRACTION Left   . CATARACT EXTRACTION W/ INTRAOCULAR LENS  IMPLANT, BILATERAL Bilateral   . CORONARY ANGIOPLASTY WITH STENT PLACEMENT  07/18/2018  . CORONARY STENT INTERVENTION N/A 07/18/2018   Procedure: CORONARY STENT INTERVENTION;  Surgeon: Lorretta Harp, MD;  Location: [facility];  Service: Cardiovascular;  Laterality: N/A;  . INTRAVASCULAR PRESSURE WIRE/FFR STUDY N/A 07/18/2018   Procedure: INTRAVASCULAR PRESSURE WIRE/FFR STUDY;  Surgeon: Lorretta Harp, MD;  Location: [facility];  Service: Cardiovascular;  Laterality: N/A;  . JOINT REPLACEMENT    . LEFT HEART CATH AND CORONARY ANGIOGRAPHY N/A  07/18/2018   Procedure: LEFT HEART CATH AND CORONARY ANGIOGRAPHY;  Surgeon: Lorretta Harp, MD;  Location: [facility];  Service: Cardiovascular;  Laterality: N/A;  . MULTIPLE TOOTH EXTRACTIONS    . TOTAL KNEE ARTHROPLASTY Bilateral 2005 - 2011   right - left  . VAGINAL HYSTERECTOMY  1984  . WISDOM TOOTH EXTRACTION      Social History   Tobacco Use  . Smoking status: Former Smoker    Packs/day: 2.00    Years: 30.00    Pack years: 60.00    Types: Cigarettes    Quit date: 12/03/1994    Years since quitting: 24.7  . Smokeless tobacco: Never Used  Substance Use Topics  . Alcohol use: Yes    Comment: 07/18/2018 "1 drink q 1-2 months; if that"  . Drug use: Never    Family History  Problem Relation Age of Onset  . Stroke Mother        mini  stroke  . Kidney disease Mother   . Heart failure Mother   . Hypertension Mother   . Diabetes Sister        type 2  . Kidney disease Sister   . Allergic rhinitis Sister   . Heart attack Maternal Grandfather   . Hyperlipidemia Sister   . Colon cancer Neg Hx     Allergies  Allergen Reactions  . Niaspan [Niacin Er] Nausea And Vomiting and Swelling    Swelling in mouth  . Pantoprazole     Mouth sores  . Bupropion Other (See Comments)    Uncontrollable shakes  . Nitroglycerin     NOT allergic - cardiology has recommended she NOT use this due to h/o severe carotid disease as it may decrease cerebral perfusion  . Penicillins Hives    DID THE REACTION INVOLVE: Swelling of the face/tongue/throat, SOB, or low BP? Yes Sudden or severe rash/hives, skin peeling, or the inside of the mouth or nose? Unknown Did it require medical treatment? Unknown When did it last happen?[age]  If all above answers are "NO", may proceed with cephalosporin use.  . Statins   . Sulfonamide Derivatives Hives  . Apple Rash  . Banana Rash    Medication list has been reviewed and updated.  Current Outpatient Medications on File Prior to Visit   Medication Sig Dispense Refill  . acetaminophen (TYLENOL) 500 MG tablet Take 500 mg by mouth every 6 (six) hours as needed for moderate pain.     Marland Kitchen albuterol (PROVENTIL HFA;VENTOLIN HFA) 108 (90 Base) MCG/ACT inhaler Inhale 2 puffs into the lungs every 6 (six) hours as needed for wheezing or shortness of breath.    Marland Kitchen aspirin EC 81 MG tablet Take 1 tablet (81 mg total) by mouth daily. 90 tablet 3  . bevacizumab (AVASTIN) 400 MG/16ML SOLN     . budesonide-formoterol (SYMBICORT) 80-4.5 MCG/ACT inhaler Inhale 2 puffs into the lungs 2 (two) times daily. 1 Inhaler 5  . carvedilol (COREG) 6.25 MG tablet Take 1 tablet (6.25 mg total) by mouth 2 (two) times daily with a meal. 60 tablet 3  . Cholecalciferol (D3 MAXIMUM STRENGTH) 5000 UNITS capsule Take 5,000 Units by mouth daily.    . Choline Fenofibrate (FENOFIBRIC ACID) 135 MG CPDR Take 1 capsule by mouth once daily 90 capsule 1  . clonazePAM (KLONOPIN) 1 MG tablet Take 1 tablet (1 mg total) by mouth 3 (three) times daily as needed. for anxiety 90 tablet 1  . clopidogrel (PLAVIX) 75 MG tablet Take 1 tablet (75 mg total) by mouth daily. 90 tablet 6  . cyclobenzaprine (FLEXERIL) 10 MG tablet Take 1 tablet (10 mg total) by mouth 2 (two) times daily as needed. 20 tablet 0  . ezetimibe (ZETIA) 10 MG tablet Take 1 tablet (10 mg total) by mouth daily. 90 tablet 1  . famotidine (PEPCID) 40 MG tablet Take 1 tablet (40 mg total) by mouth daily. 90 tablet 1  . FISH OIL-KRILL OIL PO Take 1,000 mg by mouth daily.     Marland Kitchen FLUoxetine (PROZAC) 20 MG tablet Take 1 tablet (20 mg total) by mouth daily. 90 tablet 1  . gabapentin (NEURONTIN) 100 MG capsule Take 2 capsules (200 mg total) by mouth at bedtime. 180 capsule 1  . levothyroxine (SYNTHROID) 125 MCG tablet TAKE 1 TABLET BY MOUTH ONCE DAILY BEFORE BREAKFAST 90 tablet 1  . lidocaine (LIDODERM) 5 % Place 1 patch onto the skin daily. Remove & Discard patch within 12 hours or  as directed by MD 15 patch 0  . losartan  (COZAAR) 25 MG tablet 50 mg daily.    . montelukast (SINGULAIR) 10 MG tablet Take 1 tablet (10 mg total) by mouth at bedtime as needed. 90 tablet 3  . Multiple Vitamins-Minerals (KP VISION FORMULA) TABS daily.    . vitamin B-12 (CYANOCOBALAMIN) 500 MCG tablet Take 500 mcg by mouth daily.     . nitroGLYCERIN (NITROSTAT) 0.4 MG SL tablet Place 1 tablet (0.4 mg total) under the tongue every 5 (five) minutes as needed for chest pain. 90 tablet 3   No current facility-administered medications on file prior to visit.    Review of Systems:  As per HPI- otherwise negative. Looks well   Physical Examination: Vitals:   09/04/19 1355  BP: (!) 142/82  Pulse: 63  Resp: 20  Temp: (!) 97.4 F (36.3 C)  SpO2: 98%   Vitals:   09/04/19 1355  Weight: 194 lb (88 kg)  Height: 5\' 3"  (1.6 m)   Body mass index is 34.37 kg/m. Ideal Body Weight: Weight in (lb) to have BMI = 25: 140.8  GEN: WDWN, NAD, Non-toxic, A & O x 3, obese, looks well  HEENT: Atraumatic, Normocephalic. Neck supple. No masses, No LAD. Ears and Nose: No external deformity. CV: RRR, No M/G/R. No JVD. No thrill. No extra heart sounds. PULM: CTA B, no wheezes, crackles, rhonchi. No retractions. No resp. distress. No accessory muscle use. ABD: S, NT, ND, +BS. No rebound. No HSM. EXTR: No c/c/e NEURO Normal gait.  PSYCH: Normally interactive. Conversant. Not depressed or anxious appearing.  Calm demeanor.  On strength testing, she has decreased strength of the left lower extremity.  Patient states this is chronic and due to knee trouble Otherwise strength is normal in all extremities  Assessment and Plan: Renal insufficiency  Essential hypertension - Plan: amLODipine (NORVASC) 2.5 MG tablet  Left hip pain - Plan: traMADol (ULTRAM) 50 MG tablet  Here today for follow-up visit Blood pressure is still borderline high, will add amlodipine 2.5 mg She has significant renal disease, is seeing her nephrologist soon Requests a  refill of tramadol for her chronic left hip pain.  I did refill 30 pills, but advised her to contact her primary doctor for further refills Today patient says she is very stressed, she has complaint of an aching in the entire left side of her body.  Advised her that this is an unusual symptom, and recommended that she be seen in the ER for further evaluation.  She declines at this time, but does agree to seek care if she gets worse Moderate medical decision making This visit occurred during the SARS-CoV-2 public health emergency.  Safety protocols were in place, including screening questions prior to the visit, additional usage of staff PPE, and extensive cleaning of exam room while observing appropriate contact time as indicated for disinfecting solutions.    Signed Lamar Blinks, MD

## 2019-09-04 ENCOUNTER — Ambulatory Visit (INDEPENDENT_AMBULATORY_CARE_PROVIDER_SITE_OTHER): Payer: Medicare HMO | Admitting: Family Medicine

## 2019-09-04 ENCOUNTER — Encounter: Payer: Self-pay | Admitting: Family Medicine

## 2019-09-04 ENCOUNTER — Telehealth: Payer: Self-pay | Admitting: Gastroenterology

## 2019-09-04 ENCOUNTER — Other Ambulatory Visit: Payer: Self-pay

## 2019-09-04 VITALS — BP 142/82 | HR 63 | Temp 97.4°F | Resp 20 | Ht 63.0 in | Wt 194.0 lb

## 2019-09-04 DIAGNOSIS — M25552 Pain in left hip: Secondary | ICD-10-CM | POA: Diagnosis not present

## 2019-09-04 DIAGNOSIS — N289 Disorder of kidney and ureter, unspecified: Secondary | ICD-10-CM

## 2019-09-04 DIAGNOSIS — I10 Essential (primary) hypertension: Secondary | ICD-10-CM

## 2019-09-04 MED ORDER — TRAMADOL HCL 50 MG PO TABS: 50.0000 mg | ORAL_TABLET | Freq: Two times a day (BID) | ORAL | 0 refills | Status: DC | PRN

## 2019-09-04 MED ORDER — AMLODIPINE BESYLATE 2.5 MG PO TABS: 2.5000 mg | ORAL_TABLET | Freq: Every day | ORAL | 3 refills | Status: DC

## 2019-09-04 NOTE — Patient Instructions (Signed)
It was nice to see you again today We will add amlodipine 2.5 mg daily for your blood pressure I did refill 30 tramadol- please use sparingly and follow-up with Dr Charlett Blake if you need more  Remember tramadol can make you drowsy and should not be combined with any other sedating substance.  Please seek care if you continue to notice any concerning symptoms

## 2019-09-04 NOTE — Telephone Encounter (Signed)
Left message for patient to call back to the office;  

## 2019-09-04 NOTE — Telephone Encounter (Signed)
Patient is calling with concerns with her medication and the prep states Dr. Lyndel Safe recommended she take a pill rather then drink the solution.

## 2019-09-04 NOTE — Telephone Encounter (Signed)
Patient returned call to the office-patient is wanting to make sure the prep is not going to adversely affect her kidney function (since she is in stage 4)-nor her cardiac status ? Please advise of instructions for this patient

## 2019-09-05 MED ORDER — GABAPENTIN 100 MG PO CAPS: 200.0000 mg | ORAL_CAPSULE | Freq: Every day | ORAL | 1 refills | Status: DC

## 2019-09-05 MED ORDER — LEVOTHYROXINE SODIUM 125 MCG PO TABS: ORAL_TABLET | ORAL | 1 refills | Status: DC

## 2019-09-05 NOTE — Telephone Encounter (Signed)
Called and spoke with patient-information relayed to patient; Patient advised to call back to the office at [phone] should questions/concerns arise;  Patient verbalized understanding of information/instructions;

## 2019-09-05 NOTE — Telephone Encounter (Signed)
Please make sure she is getting MiraLAX or Plenvu These will not affect kidney function.  RG

## 2019-09-05 NOTE — Telephone Encounter (Signed)
RX's sent to pharmacy 

## 2019-09-10 ENCOUNTER — Encounter (HOSPITAL_COMMUNITY): Payer: Medicare HMO

## 2019-09-10 ENCOUNTER — Telehealth: Payer: Self-pay | Admitting: Gastroenterology

## 2019-09-10 ENCOUNTER — Ambulatory Visit: Payer: Medicare HMO

## 2019-09-10 NOTE — Telephone Encounter (Signed)
I have called and spoke to patient and she has ask that we cancel her procedure at this time. Patients procedure has been canceled. She would like for you to give her a different type of prep that is "safe for stage IV kidney disease" because she does not feel that Miralax is safe. Patient says she will reschedule procedure at a later date.

## 2019-09-10 NOTE — Telephone Encounter (Signed)
Pt calling back states she can not take the Miralax prep because she has kidney disease.  Want to speak with a nurse.

## 2019-09-10 NOTE — Telephone Encounter (Signed)
Pt reported that she has stage IV kidney disease and is afraid to take prep soln because the warning label states: "Do not use if you have kidney disease."  Pt started crying and stated that she is afraid to have procedure.

## 2019-09-11 NOTE — Progress Notes (Signed)
Addendum:   I connected with  [name] on 09/11/19 by a video enabled telemedicine application and verified that I am speaking with the correct person using two identifiers.   I discussed the limitations of evaluation and management by telemedicine. The patient expressed understanding and agreed to proceed.  RG

## 2019-09-12 ENCOUNTER — Encounter: Payer: Medicare HMO | Admitting: Gastroenterology

## 2019-09-18 ENCOUNTER — Ambulatory Visit: Payer: Medicare HMO

## 2019-09-18 ENCOUNTER — Encounter (HOSPITAL_COMMUNITY): Payer: Medicare HMO

## 2019-09-24 ENCOUNTER — Telehealth: Payer: Self-pay | Admitting: Family Medicine

## 2019-09-29 ENCOUNTER — Other Ambulatory Visit: Payer: Self-pay | Admitting: Family Medicine

## 2019-09-29 DIAGNOSIS — Z789 Other specified health status: Secondary | ICD-10-CM

## 2019-09-29 HISTORY — DX: Other specified health status: Z78.9

## 2019-09-30 ENCOUNTER — Other Ambulatory Visit: Payer: Self-pay

## 2019-09-30 ENCOUNTER — Emergency Department (HOSPITAL_BASED_OUTPATIENT_CLINIC_OR_DEPARTMENT_OTHER)
Admission: EM | Admit: 2019-09-30 | Discharge: 2019-09-30 | Disposition: A | Discharge status: Home / Self Care | Payer: Medicare HMO | Attending: Emergency Medicine | Admitting: Emergency Medicine

## 2019-09-30 ENCOUNTER — Encounter (HOSPITAL_BASED_OUTPATIENT_CLINIC_OR_DEPARTMENT_OTHER): Payer: Self-pay | Admitting: Emergency Medicine

## 2019-09-30 DIAGNOSIS — Z955 Presence of coronary angioplasty implant and graft: Secondary | ICD-10-CM | POA: Diagnosis not present

## 2019-09-30 DIAGNOSIS — L0291 Cutaneous abscess, unspecified: Secondary | ICD-10-CM

## 2019-09-30 DIAGNOSIS — I509 Heart failure, unspecified: Secondary | ICD-10-CM | POA: Insufficient documentation

## 2019-09-30 DIAGNOSIS — Z96652 Presence of left artificial knee joint: Secondary | ICD-10-CM | POA: Diagnosis not present

## 2019-09-30 DIAGNOSIS — J449 Chronic obstructive pulmonary disease, unspecified: Secondary | ICD-10-CM | POA: Diagnosis not present

## 2019-09-30 DIAGNOSIS — Z8673 Personal history of transient ischemic attack (TIA), and cerebral infarction without residual deficits: Secondary | ICD-10-CM | POA: Insufficient documentation

## 2019-09-30 DIAGNOSIS — E039 Hypothyroidism, unspecified: Secondary | ICD-10-CM | POA: Diagnosis not present

## 2019-09-30 DIAGNOSIS — Z96651 Presence of right artificial knee joint: Secondary | ICD-10-CM | POA: Diagnosis not present

## 2019-09-30 DIAGNOSIS — I2510 Atherosclerotic heart disease of native coronary artery without angina pectoris: Secondary | ICD-10-CM | POA: Insufficient documentation

## 2019-09-30 DIAGNOSIS — N189 Chronic kidney disease, unspecified: Secondary | ICD-10-CM | POA: Insufficient documentation

## 2019-09-30 DIAGNOSIS — I130 Hypertensive heart and chronic kidney disease with heart failure and stage 1 through stage 4 chronic kidney disease, or unspecified chronic kidney disease: Secondary | ICD-10-CM | POA: Diagnosis not present

## 2019-09-30 DIAGNOSIS — Z87891 Personal history of nicotine dependence: Secondary | ICD-10-CM | POA: Diagnosis not present

## 2019-09-30 DIAGNOSIS — N611 Abscess of the breast and nipple: Secondary | ICD-10-CM | POA: Diagnosis not present

## 2019-09-30 MED ORDER — LIDOCAINE-EPINEPHRINE 1 %-1:100000 IJ SOLN
20.0000 mL | Freq: Once | INTRAMUSCULAR | Status: AC
  Administered 2019-09-30: 20 mL
  Filled 2019-09-30: qty 1

## 2019-09-30 MED ORDER — CEPHALEXIN 500 MG PO CAPS: 500.0000 mg | ORAL_CAPSULE | Freq: Four times a day (QID) | ORAL | 0 refills | Status: AC

## 2019-09-30 NOTE — ED Triage Notes (Signed)
Pt c/o abscess on underside of right breast x 1 week. Reports rupture last night. Pt denies fever

## 2019-09-30 NOTE — Discharge Instructions (Addendum)
You were evaluated in the Emergency Department and after careful evaluation, we did not find any emergent condition requiring admission or further testing in the hospital.  Your exam/testing today was overall reassuring.  We drained the abscess here in the emergency department.  Please take the antibiotics as directed.  Please return to the Emergency Department if you experience any worsening of your condition.  We encourage you to follow up with a primary care provider.  Thank you for allowing [place] to be a part of your care.

## 2019-09-30 NOTE — ED Provider Notes (Signed)
[facility] Emergency Department Provider Note MRN:  [id number]  Arrival date & time: 09/30/19     Chief Complaint   Abscess   History of Present Illness   Emily Mitchell is a 74 y.o. year-old female with a history of CAD, CHF, stroke presenting to the ED with chief complaint of abscess.  Painful red nodule beneath the right breast for the past 2 or 3 days.  Has happened 3 times over the past 6 months, keeps coming back.  Denies fever, no other complaints.  Started leaking last night.  No headache or vision change, no chest pain or shortness of breath, no abdominal pain, no numbness or weakness.  Pain is mild to moderate, constant, worse with palpation.  Review of Systems  A complete 10 system review of systems was obtained and all systems are negative except as noted in the HPI and PMH.   Patient's Health History    Past Medical History:  Diagnosis Date  . Anemia 07/05/2014  . Anxiety and depression 12/14/2008   Qualifier: Diagnosis of  By: [name] LPN, Almyra Free    . Arthritis    "fingers, toes, knees, joints" (07/18/2018)  . Asthma   . Atypical chest pain 05/23/2015  . CAD (coronary artery disease)    a. [place] with cath 06/2018 s/p DES to LAD.  Marland Kitchen Carotid artery occlusion    a. R carotid occluded, 96-29% LICA.  Marland Kitchen CHF (congestive heart failure) ([place])   . Chicken pox as a child  . Chronic kidney disease    Bright's Disease at age 79   . COPD (chronic obstructive pulmonary disease) ([place])   . GERD (gastroesophageal reflux disease)   . H. pylori infection 10/19/2013  . Hepatitis 1970s   "don't know for sure which one it was; think it was A" (07/18/2018)  . Hyperglycemia 03/14/2016  . Hyperlipidemia   . Hypertension   . Hypothyroidism   . Knee pain, bilateral 12/17/2012  . Left hip pain 05/23/2015  . Low back pain 09/19/2015  . Macular degeneration of both eyes 12/16/2015   Right eye is wet Left Eye is dry Shots every 11 to 12 weeks in right eye at  opthamologist office  . Medicare annual wellness visit, subsequent 02/21/2015  . Mumps 74 yrs old  . Pneumonia    "at least once" (07/18/2018)  . Preventative health care 03/14/2016  . Pulmonary emboli ([place]) 05/26/2013  . Sleep apnea    "don't use CPAP anymore; dr said I didn't have to" (07/18/2018)  . Stroke [facility])    "been told I've had some strokes; didn't know I'd had them" (07/18/2018)  . Tremor   . UTI (lower urinary tract infection) 10/19/2013    Past Surgical History:  Procedure Laterality Date  . APPENDECTOMY  1984  . CARDIAC CATHETERIZATION  ~ 2006  . CAROTID ANGIOGRAPHY Left 08/20/2018   Procedure: CAROTID ANGIOGRAPHY;  Surgeon: Serafina Mitchell, MD;  Location: [place] [facility];  Service: Cardiovascular;  Laterality: Left;  . CAROTID ENDARTERECTOMY Right 05/10/07   cea  . CATARACT EXTRACTION Left   . CATARACT EXTRACTION W/ INTRAOCULAR LENS  IMPLANT, BILATERAL Bilateral   . CORONARY ANGIOPLASTY WITH STENT PLACEMENT  07/18/2018  . CORONARY STENT INTERVENTION N/A 07/18/2018   Procedure: CORONARY STENT INTERVENTION;  Surgeon: Lorretta Harp, MD;  Location: [place] [facility];  Service: Cardiovascular;  Laterality: N/A;  . INTRAVASCULAR PRESSURE WIRE/FFR STUDY N/A 07/18/2018   Procedure: INTRAVASCULAR PRESSURE WIRE/FFR STUDY;  Surgeon:  Lorretta Harp, MD;  Location: [place] [facility];  Service: Cardiovascular;  Laterality: N/A;  . JOINT REPLACEMENT    . LEFT HEART CATH AND CORONARY ANGIOGRAPHY N/A 07/18/2018   Procedure: LEFT HEART CATH AND CORONARY ANGIOGRAPHY;  Surgeon: Lorretta Harp, MD;  Location: [place] [facility];  Service: Cardiovascular;  Laterality: N/A;  . MULTIPLE TOOTH EXTRACTIONS    . TOTAL KNEE ARTHROPLASTY Bilateral 2005 - 2011   right - left  . VAGINAL HYSTERECTOMY  1984  . WISDOM TOOTH EXTRACTION      Family History  Problem Relation Age of Onset  . Stroke Mother        mini stroke  . Kidney disease Mother   . Heart failure Mother   .  Hypertension Mother   . Diabetes Sister        type 2  . Kidney disease Sister   . Allergic rhinitis Sister   . Heart attack Maternal Grandfather   . Hyperlipidemia Sister   . Colon cancer Neg Hx     Social History   Socioeconomic History  . Marital status: Divorced    Spouse name: Not on file  . Number of children: 2  . Years of education: Not on file  . Highest education level: Not on file  Occupational History  . Not on file  Tobacco Use  . Smoking status: Former Smoker    Packs/day: 2.00    Years: 30.00    Pack years: 60.00    Types: Cigarettes    Quit date: 12/03/1994    Years since quitting: 24.8  . Smokeless tobacco: Never Used  Substance and Sexual Activity  . Alcohol use: Yes    Comment: 07/18/2018 "1 drink q 1-2 months; if that"  . Drug use: Never  . Sexual activity: Not Currently    Comment: lives with son and friend, no dietary restrictions  Other Topics Concern  . Not on file  Social History Narrative   epworth sleepiness scale = 11 (06/15/2015)   Social Determinants of Health   Financial Resource Strain:   . Difficulty of Paying Living Expenses: Not on file  Food Insecurity:   . Worried About Charity fundraiser in the Last Year: Not on file  . Ran Out of Food in the Last Year: Not on file  Transportation Needs:   . Lack of Transportation (Medical): Not on file  . Lack of Transportation (Non-Medical): Not on file  Physical Activity:   . Days of Exercise per Week: Not on file  . Minutes of Exercise per Session: Not on file  Stress:   . Feeling of Stress : Not on file  Social Connections:   . Frequency of Communication with Friends and Family: Not on file  . Frequency of Social Gatherings with Friends and Family: Not on file  . Attends Religious Services: Not on file  . Active Member of Clubs or Organizations: Not on file  . Attends Archivist Meetings: Not on file  . Marital Status: Not on file  Intimate Partner Violence:   . Fear of  Current or Ex-Partner: Not on file  . Emotionally Abused: Not on file  . Physically Abused: Not on file  . Sexually Abused: Not on file     Physical Exam   Vitals:   09/30/19 0845  BP: 139/62  Pulse: (!) 56  Resp: 18  Temp: 97.7 F (36.5 C)  SpO2: 98%    CONSTITUTIONAL: Well-appearing, NAD NEURO:  Alert and oriented x 3, no focal deficits EYES:  eyes equal and reactive ENT/NECK:  no LAD, no JVD CARDIO: Regular rate, well-perfused, normal S1 and S2 PULM:  CTAB no wheezing or rhonchi GI/GU:  normal bowel sounds, non-distended, non-tender MSK/SPINE:  No gross deformities, no edema SKIN: 2 or 3 cm area of erythema beneath the right breast with small central leaking wound expressing blood, some scattered surrounding induration within the erythema PSYCH:  Appropriate speech and behavior  *Additional and/or pertinent findings included in MDM below  Diagnostic and Interventional Summary    EKG Interpretation  Date/Time:    Ventricular Rate:    PR Interval:    QRS Duration:   QT Interval:    QTC Calculation:   R Axis:     Text Interpretation:        Cardiac Monitoring Interpretation:  Labs Reviewed - No data to display  No orders to display    Medications  lidocaine-EPINEPHrine (XYLOCAINE W/EPI) 1 %-1:100000 (with pres) injection 20 mL (20 mLs Other Given by Other 09/30/19 8299)     Procedures  /  Critical Care Ultrasound ED Soft Tissue  Date/Time: 09/30/2019 9:21 AM Performed by: Maudie Flakes, MD Authorized by: Maudie Flakes, MD   Procedure details:    Indications: localization of abscess and evaluate for cellulitis     Transverse view:  Visualized   Longitudinal view:  Visualized   Images: archived   Location:    Location: breast     Side:  Right Findings:     abscess present    cellulitis present .Marland KitchenIncision and Drainage  Date/Time: 09/30/2019 9:22 AM Performed by: Maudie Flakes, MD Authorized by: Maudie Flakes, MD   Consent:    Consent  obtained:  Verbal   Consent given by:  Patient   Risks discussed:  Bleeding, damage to other organs, incomplete drainage, infection and pain   Alternatives discussed:  Alternative treatment Location:    Type:  Abscess   Size:  3cm   Location: Beneath the right breast. Pre-procedure details:    Skin preparation:  Chloraprep Anesthesia (see MAR for exact dosages):    Anesthesia method:  Local infiltration   Local anesthetic:  Lidocaine 1% WITH epi Procedure type:    Complexity:  Complex Procedure details:    Incision types:  Single straight   Incision depth:  Submucosal   Scalpel blade:  11   Wound management:  Probed and deloculated and irrigated with saline   Drainage:  Bloody   Drainage amount:  Scant   Wound treatment:  Wound left open   Packing materials:  None Post-procedure details:    Patient tolerance of procedure:  Tolerated well, no immediate complications    ED Course and Medical Decision Making  I have reviewed the triage vital signs, the nursing notes, and pertinent available records from the EMR.  Pertinent labs & imaging results that were available during my care of the patient were reviewed by me and considered in my medical decision making (see below for details).     Consistent with abscess that is already draining, however on ultrasound there is still appears to be a fluid collection that is not easily expressed.  Discussed options with patient, could perform incision and drainage today or simply treat with antibiotics and keep a close eye on it.  Patient is tired of the recurrence and wants the incision and drainage today.  9:48 AM update: Drained as described above, appropriate for discharge.  Barth Kirks. Sedonia Small, [place] [email or web address]  Final Clinical Impressions(s) / ED Diagnoses     ICD-10-CM   1. Abscess  L02.91     ED Discharge Orders         Ordered    cephALEXin (KEFLEX) 500 MG capsule   4 times daily     09/30/19 0947           Discharge Instructions Discussed with and Provided to Patient:     Discharge Instructions     You were evaluated in the Emergency Department and after careful evaluation, we did not find any emergent condition requiring admission or further testing in the hospital.  Your exam/testing today was overall reassuring.  We drained the abscess here in the emergency department.  Please take the antibiotics as directed.  Please return to the Emergency Department if you experience any worsening of your condition.  We encourage you to follow up with a primary care provider.  Thank you for allowing [place] to be a part of your care.        Maudie Flakes, MD 09/30/19 [phone]

## 2019-10-01 ENCOUNTER — Ambulatory Visit: Payer: Medicare HMO | Admitting: Family Medicine

## 2019-10-15 ENCOUNTER — Other Ambulatory Visit: Payer: Self-pay | Admitting: Family Medicine

## 2019-10-15 NOTE — Telephone Encounter (Signed)
Requesting: Clonazepam Contract: None UDS: None Last OV: 2.4.2021 Next OV: 6.1.2021 Last Refill: 2.15.2021   Please advise

## 2019-10-17 ENCOUNTER — Telehealth (HOSPITAL_COMMUNITY): Payer: Self-pay

## 2019-10-17 ENCOUNTER — Other Ambulatory Visit: Payer: Self-pay | Admitting: *Deleted

## 2019-10-17 DIAGNOSIS — I6523 Occlusion and stenosis of bilateral carotid arteries: Secondary | ICD-10-CM

## 2019-10-17 NOTE — Telephone Encounter (Signed)

## 2019-10-20 ENCOUNTER — Ambulatory Visit (INDEPENDENT_AMBULATORY_CARE_PROVIDER_SITE_OTHER): Payer: Medicare HMO | Admitting: Physician Assistant

## 2019-10-20 ENCOUNTER — Ambulatory Visit (HOSPITAL_COMMUNITY)
Admission: RE | Admit: 2019-10-20 | Discharge: 2019-10-20 | Disposition: A | Discharge status: Home / Self Care | Payer: Medicare HMO | Source: Ambulatory Visit | Attending: Surgery | Admitting: Surgery

## 2019-10-20 ENCOUNTER — Other Ambulatory Visit: Payer: Self-pay

## 2019-10-20 VITALS — BP 123/71 | HR 61 | Temp 97.3°F | Resp 14 | Ht 63.0 in | Wt 186.0 lb

## 2019-10-20 DIAGNOSIS — I6523 Occlusion and stenosis of bilateral carotid arteries: Secondary | ICD-10-CM | POA: Diagnosis not present

## 2019-10-20 DIAGNOSIS — N184 Chronic kidney disease, stage 4 (severe): Secondary | ICD-10-CM | POA: Diagnosis not present

## 2019-10-20 DIAGNOSIS — I6521 Occlusion and stenosis of right carotid artery: Secondary | ICD-10-CM

## 2019-10-20 NOTE — Progress Notes (Signed)
Office Note   CC:  follow up Requesting Provider:  Mosie Lukes, MD  HPI: Emily Mitchell is a 74 y.o. (Mar 24, 1946) female who presents surveillance of left carotid artery stenosis.  The patient had a prior right carotid endarterectomy and developed occlusion and is remained asymptomatic.  She had history of prior left ICA stenosis of 50 to 60%.  She currently denies any lateralizing weakness slurred speech, monocular blindness, double or blurry vision.  She tells me she has been diagnosed with macular degeneration in the right eye and is getting injections every 15 weeks.  The pt is on Zetia for cholesterol management.  The pt is on a daily aspirin.   Other AC:  Plavix The pt is on BB,  for hypertension.   The pt is not diabetic.   Tobacco hx:  Former; quit 1996 (60 pyh)  Past Medical History:  Diagnosis Date  . Anemia 07/05/2014  . Anxiety and depression 12/14/2008   Qualifier: Diagnosis of  By: [name] LPN, Almyra Free    . Arthritis    "fingers, toes, knees, joints" (07/18/2018)  . Asthma   . Atypical chest pain 05/23/2015  . CAD (coronary artery disease)    a. [place] with cath 06/2018 s/p DES to LAD.  Emily Mitchell Kitchen Carotid artery occlusion    a. R carotid occluded, 93-81% LICA.  Emily Mitchell Kitchen CHF (congestive heart failure) ([place])   . Chicken pox as a child  . Chronic kidney disease    Bright's Disease at age 22   . COPD (chronic obstructive pulmonary disease) ([place])   . GERD (gastroesophageal reflux disease)   . H. pylori infection 10/19/2013  . Hepatitis 1970s   "don't know for sure which one it was; think it was A" (07/18/2018)  . Hyperglycemia 03/14/2016  . Hyperlipidemia   . Hypertension   . Hypothyroidism   . Knee pain, bilateral 12/17/2012  . Left hip pain 05/23/2015  . Low back pain 09/19/2015  . Macular degeneration of both eyes 12/16/2015   Right eye is wet Left Eye is dry Shots every 11 to 12 weeks in right eye at opthamologist office  . Medicare annual wellness visit, subsequent 02/21/2015  .  Mumps 74 yrs old  . Pneumonia    "at least once" (07/18/2018)  . Preventative health care 03/14/2016  . Pulmonary emboli ([place]) 05/26/2013  . Sleep apnea    "don't use CPAP anymore; dr said I didn't have to" (07/18/2018)  . Stroke [facility])    "been told I've had some strokes; didn't know I'd had them" (07/18/2018)  . Tremor   . UTI (lower urinary tract infection) 10/19/2013    Past Surgical History:  Procedure Laterality Date  . APPENDECTOMY  1984  . CARDIAC CATHETERIZATION  ~ 2006  . CAROTID ANGIOGRAPHY Left 08/20/2018   Procedure: CAROTID ANGIOGRAPHY;  Surgeon: Serafina Mitchell, MD;  Location: [place] [facility];  Service: Cardiovascular;  Laterality: Left;  . CAROTID ENDARTERECTOMY Right 05/10/07   cea  . CATARACT EXTRACTION Left   . CATARACT EXTRACTION W/ INTRAOCULAR LENS  IMPLANT, BILATERAL Bilateral   . CORONARY ANGIOPLASTY WITH STENT PLACEMENT  07/18/2018  . CORONARY STENT INTERVENTION N/A 07/18/2018   Procedure: CORONARY STENT INTERVENTION;  Surgeon: Lorretta Harp, MD;  Location: [place] [facility];  Service: Cardiovascular;  Laterality: N/A;  . INTRAVASCULAR PRESSURE WIRE/FFR STUDY N/A 07/18/2018   Procedure: INTRAVASCULAR PRESSURE WIRE/FFR STUDY;  Surgeon: Lorretta Harp, MD;  Location: [place] [facility];  Service: Cardiovascular;  Laterality:  N/A;  . JOINT REPLACEMENT    . LEFT HEART CATH AND CORONARY ANGIOGRAPHY N/A 07/18/2018   Procedure: LEFT HEART CATH AND CORONARY ANGIOGRAPHY;  Surgeon: Lorretta Harp, MD;  Location: [place] [facility];  Service: Cardiovascular;  Laterality: N/A;  . MULTIPLE TOOTH EXTRACTIONS    . TOTAL KNEE ARTHROPLASTY Bilateral 2005 - 2011   right - left  . VAGINAL HYSTERECTOMY  1984  . WISDOM TOOTH EXTRACTION      Social History   Socioeconomic History  . Marital status: Divorced    Spouse name: Not on file  . Number of children: 2  . Years of education: Not on file  . Highest education level: Not on file  Occupational History  .  Not on file  Tobacco Use  . Smoking status: Former Smoker    Packs/day: 2.00    Years: 30.00    Pack years: 60.00    Types: Cigarettes    Quit date: 12/03/1994    Years since quitting: 24.8  . Smokeless tobacco: Never Used  Substance and Sexual Activity  . Alcohol use: Yes    Comment: 07/18/2018 "1 drink q 1-2 months; if that"  . Drug use: Never  . Sexual activity: Not Currently    Comment: lives with son and friend, no dietary restrictions  Other Topics Concern  . Not on file  Social History Narrative   epworth sleepiness scale = 11 (06/15/2015)   Social Determinants of Health   Financial Resource Strain:   . Difficulty of Paying Living Expenses:   Food Insecurity:   . Worried About Charity fundraiser in the Last Year:   . Arboriculturist in the Last Year:   Transportation Needs:   . Film/video editor (Medical):   Emily Mitchell Kitchen Lack of Transportation (Non-Medical):   Physical Activity:   . Days of Exercise per Week:   . Minutes of Exercise per Session:   Stress:   . Feeling of Stress :   Social Connections:   . Frequency of Communication with Friends and Family:   . Frequency of Social Gatherings with Friends and Family:   . Attends Religious Services:   . Active Member of Clubs or Organizations:   . Attends Archivist Meetings:   Emily Mitchell Kitchen Marital Status:   Intimate Partner Violence:   . Fear of Current or Ex-Partner:   . Emotionally Abused:   Emily Mitchell Kitchen Physically Abused:   . Sexually Abused:     Family History  Problem Relation Age of Onset  . Stroke Mother        mini stroke  . Kidney disease Mother   . Heart failure Mother   . Hypertension Mother   . Diabetes Sister        type 2  . Kidney disease Sister   . Allergic rhinitis Sister   . Heart attack Maternal Grandfather   . Hyperlipidemia Sister   . Colon cancer Neg Hx     Current Outpatient Medications  Medication Sig Dispense Refill  . acetaminophen (TYLENOL) 500 MG tablet Take 500 mg by mouth every 6  (six) hours as needed for moderate pain.     Emily Mitchell Kitchen albuterol (PROVENTIL HFA;VENTOLIN HFA) 108 (90 Base) MCG/ACT inhaler Inhale 2 puffs into the lungs every 6 (six) hours as needed for wheezing or shortness of breath.    Emily Mitchell Kitchen amLODipine (NORVASC) 2.5 MG tablet Take 1 tablet (2.5 mg total) by mouth daily. 90 tablet 3  . aspirin EC 81 MG  tablet Take 1 tablet (81 mg total) by mouth daily. 90 tablet 3  . bevacizumab (AVASTIN) 400 MG/16ML SOLN     . budesonide-formoterol (SYMBICORT) 80-4.5 MCG/ACT inhaler Inhale 2 puffs into the lungs 2 (two) times daily. 1 Inhaler 5  . carvedilol (COREG) 6.25 MG tablet Take 1 tablet (6.25 mg total) by mouth 2 (two) times daily with a meal. 60 tablet 3  . Cholecalciferol (D3 MAXIMUM STRENGTH) 5000 UNITS capsule Take 5,000 Units by mouth daily.    . Choline Fenofibrate (FENOFIBRIC ACID) 135 MG CPDR Take 1 capsule by mouth once daily 90 capsule 1  . clonazePAM (KLONOPIN) 1 MG tablet TAKE ONE TABLET BY MOUTH THREE TIMES A DAY AS NEEDED FOR ANXIETY 90 tablet 0  . clopidogrel (PLAVIX) 75 MG tablet Take 1 tablet (75 mg total) by mouth daily. 90 tablet 6  . cyclobenzaprine (FLEXERIL) 10 MG tablet Take 1 tablet (10 mg total) by mouth 2 (two) times daily as needed. 20 tablet 0  . ezetimibe (ZETIA) 10 MG tablet Take 1 tablet (10 mg total) by mouth daily. 90 tablet 1  . famotidine (PEPCID) 40 MG tablet Take 1 tablet (40 mg total) by mouth daily. 90 tablet 1  . FISH OIL-KRILL OIL PO Take 1,000 mg by mouth daily.     Emily Mitchell Kitchen FLUoxetine (PROZAC) 20 MG tablet Take 1 tablet (20 mg total) by mouth daily. 90 tablet 1  . gabapentin (NEURONTIN) 100 MG capsule Take 2 capsules (200 mg total) by mouth at bedtime. 180 capsule 1  . levothyroxine (SYNTHROID) 125 MCG tablet TAKE 1 TABLET BY MOUTH ONCE DAILY BEFORE BREAKFAST 90 tablet 1  . lidocaine (LIDODERM) 5 % Place 1 patch onto the skin daily. Remove & Discard patch within 12 hours or as directed by MD 15 patch 0  . montelukast (SINGULAIR) 10 MG tablet  Take 1 tablet (10 mg total) by mouth at bedtime as needed. 90 tablet 3  . Multiple Vitamins-Minerals (KP VISION FORMULA) TABS daily.    . traMADol (ULTRAM) 50 MG tablet Take 1 tablet (50 mg total) by mouth every 12 (twelve) hours as needed for moderate pain or severe pain. 30 tablet 0  . vitamin B-12 (CYANOCOBALAMIN) 500 MCG tablet Take 500 mcg by mouth daily.     . nitroGLYCERIN (NITROSTAT) 0.4 MG SL tablet Place 1 tablet (0.4 mg total) under the tongue every 5 (five) minutes as needed for chest pain. 90 tablet 3   No current facility-administered medications for this visit.    Allergies  Allergen Reactions  . Niaspan [Niacin Er] Nausea And Vomiting and Swelling    Swelling in mouth  . Pantoprazole     Mouth sores  . Bupropion Other (See Comments)    Uncontrollable shakes  . Nitroglycerin     NOT allergic - cardiology has recommended she NOT use this due to h/o severe carotid disease as it may decrease cerebral perfusion  . Penicillins Hives    DID THE REACTION INVOLVE: Swelling of the face/tongue/throat, SOB, or low BP? Yes Sudden or severe rash/hives, skin peeling, or the inside of the mouth or nose? Unknown Did it require medical treatment? Unknown When did it last happen?[age]  If all above answers are "NO", may proceed with cephalosporin use.  . Statins   . Sulfonamide Derivatives Hives  . Apple Rash  . Banana Rash     REVIEW OF SYSTEMS:   [X]  denotes positive finding, [ ]  denotes negative finding Cardiac  Comments:  Chest pain or  chest pressure:    Shortness of breath upon exertion:    Short of breath when lying flat:    Irregular heart rhythm:        Vascular    Pain in calf, thigh, or hip brought on by ambulation:    Pain in feet at night that wakes you up from your sleep:     Blood clot in your veins:    Leg swelling:         Pulmonary    Oxygen at home:    Productive cough:     Wheezing:         Neurologic    Sudden weakness in arms or legs:       Sudden numbness in arms or legs:     Sudden onset of difficulty speaking or slurred speech:    Temporary loss of vision in one eye:     Problems with dizziness:         Gastrointestinal    Blood in stool:     Vomited blood:         Genitourinary    Burning when urinating:     Blood in urine:        Psychiatric    Major depression:         Hematologic    Bleeding problems:    Problems with blood clotting too easily:        Skin    Rashes or ulcers:        Constitutional    Fever or chills:      PHYSICAL EXAMINATION:  Vitals:   10/20/19 1536 10/20/19 1540  BP: 131/67 123/71  Pulse: 63 61  Resp: 14   Temp: (!) 97.3 F (36.3 C)   TempSrc: Temporal   SpO2: 99%   Weight: 186 lb (84.4 kg)   Height: 5\' 3"  (1.6 m)     General:  WDWN in NAD; vital signs documented above Gait: Not observed HENT: WNL, normocephalic Pulmonary: normal non-labored breathing , without Rales, rhonchi,  wheezing Cardiac: regular HR, without  Murmurs without carotid bruits Abdomen: soft, NT, no masses Skin: without rashes Vascular Exam/Pulses: Extremities: without ischemic changes, without Gangrene , without cellulitis; without open wounds;  Musculoskeletal: no muscle wasting or atrophy  Neurologic: A&O X 3;  No focal weakness or paresthesias are detected. Tongue midline. Face symmetric. Psychiatric:  The pt has Normal affect.  Pulse exam: She has 2+ bilateral brachial, radial and dorsalis pedis pulses  Non-Invasive Vascular Imaging: Right Carotid: Known total occlusion of the right ICA.   Left Carotid: Velocities in the left ICA are consistent with a 40-59%  stenosis.   Vertebrals: Bilateral vertebral arteries demonstrate antegrade flow.  Subclavians: Normal flow hemodynamics were seen in bilateral subclavian  arteries.     Carotid angiography on 1/61/0960 showed LICA stenosis of <45% Carotid duplex sonography on 40/98/1191 LICA showed stenosis of 40 to 59%   ASSESSMENT/PLAN::  74 y.o. female here for follow up for follow-up carotid artery stenosis.  The patient has a known occlusion of her right carotid artery.  She remains asymptomatic.  Left internal carotid artery stenosis stable.  We reviewed signs and symptoms of stroke/TIA.  Continue Plavix, aspirin, statin.  Barbie Banner, PA-C [facility] [phone]  Clinic MD:  Trula Slade

## 2019-10-21 ENCOUNTER — Other Ambulatory Visit: Payer: Self-pay | Admitting: *Deleted

## 2019-10-21 DIAGNOSIS — I6523 Occlusion and stenosis of bilateral carotid arteries: Secondary | ICD-10-CM

## 2019-10-29 ENCOUNTER — Telehealth: Payer: Self-pay | Admitting: Family Medicine

## 2019-10-29 NOTE — Telephone Encounter (Signed)
Patient was calling in regards to her covid vaccine and vaccine abstracted in chart.

## 2019-10-29 NOTE — Telephone Encounter (Signed)
Caller : [name] Call Back # [phone]  First Dose : 10/19/2019

## 2019-11-09 ENCOUNTER — Other Ambulatory Visit: Payer: Self-pay | Admitting: Family Medicine

## 2019-11-09 DIAGNOSIS — E785 Hyperlipidemia, unspecified: Secondary | ICD-10-CM

## 2019-11-10 ENCOUNTER — Telehealth: Payer: Self-pay | Admitting: Family Medicine

## 2019-11-10 NOTE — Telephone Encounter (Signed)
Form filled out and placed at front desk.

## 2019-11-10 NOTE — Telephone Encounter (Signed)
Patient wanted to inform dr Charlett Blake that she completed her covid vaccine .patient is also inquiring about Handicap tag . Please advise

## 2019-11-18 DIAGNOSIS — H3554 Dystrophies primarily involving the retinal pigment epithelium: Secondary | ICD-10-CM | POA: Diagnosis not present

## 2019-11-18 DIAGNOSIS — H353121 Nonexudative age-related macular degeneration, left eye, early dry stage: Secondary | ICD-10-CM | POA: Diagnosis not present

## 2019-11-18 DIAGNOSIS — Z961 Presence of intraocular lens: Secondary | ICD-10-CM | POA: Diagnosis not present

## 2019-11-18 DIAGNOSIS — H43823 Vitreomacular adhesion, bilateral: Secondary | ICD-10-CM | POA: Diagnosis not present

## 2019-11-18 DIAGNOSIS — H35363 Drusen (degenerative) of macula, bilateral: Secondary | ICD-10-CM | POA: Diagnosis not present

## 2019-11-18 DIAGNOSIS — H353211 Exudative age-related macular degeneration, right eye, with active choroidal neovascularization: Secondary | ICD-10-CM | POA: Diagnosis not present

## 2019-11-18 DIAGNOSIS — T1511XA Foreign body in conjunctival sac, right eye, initial encounter: Secondary | ICD-10-CM | POA: Diagnosis not present

## 2019-11-18 DIAGNOSIS — H35352 Cystoid macular degeneration, left eye: Secondary | ICD-10-CM | POA: Diagnosis not present

## 2019-11-18 DIAGNOSIS — H35443 Age-related reticular degeneration of retina, bilateral: Secondary | ICD-10-CM | POA: Diagnosis not present

## 2019-11-18 DIAGNOSIS — H04123 Dry eye syndrome of bilateral lacrimal glands: Secondary | ICD-10-CM | POA: Diagnosis not present

## 2019-11-19 ENCOUNTER — Telehealth: Payer: Self-pay | Admitting: Family Medicine

## 2019-11-19 NOTE — Telephone Encounter (Signed)
Pt dropped off document to be filled out by provider (2 document Handicap registration plate document and Disability Parking Placard), pt would like document to be mailed to her home address when document ready. Pt also dropped off a copy of Covid vaccination info. Document put at front office tray under provider's name.

## 2019-11-20 NOTE — Telephone Encounter (Signed)
Patient notified that forms are ready for pickup and vaccines abstracted into chart.

## 2019-11-25 ENCOUNTER — Other Ambulatory Visit: Payer: Self-pay | Admitting: Family Medicine

## 2019-11-27 ENCOUNTER — Other Ambulatory Visit: Payer: Self-pay | Admitting: Family Medicine

## 2019-12-05 ENCOUNTER — Other Ambulatory Visit: Payer: Self-pay | Admitting: Pulmonary Disease

## 2019-12-05 DIAGNOSIS — J449 Chronic obstructive pulmonary disease, unspecified: Secondary | ICD-10-CM

## 2019-12-15 DIAGNOSIS — N184 Chronic kidney disease, stage 4 (severe): Secondary | ICD-10-CM | POA: Diagnosis not present

## 2019-12-30 ENCOUNTER — Encounter: Payer: Self-pay | Admitting: Family Medicine

## 2019-12-30 ENCOUNTER — Ambulatory Visit (INDEPENDENT_AMBULATORY_CARE_PROVIDER_SITE_OTHER): Payer: Medicare HMO | Admitting: Family Medicine

## 2019-12-30 ENCOUNTER — Other Ambulatory Visit: Payer: Self-pay

## 2019-12-30 VITALS — BP 110/60 | HR 68 | Temp 98.7°F | Resp 16 | Ht 63.0 in | Wt 188.2 lb

## 2019-12-30 DIAGNOSIS — E782 Mixed hyperlipidemia: Secondary | ICD-10-CM | POA: Diagnosis not present

## 2019-12-30 DIAGNOSIS — F419 Anxiety disorder, unspecified: Secondary | ICD-10-CM | POA: Diagnosis not present

## 2019-12-30 DIAGNOSIS — G4733 Obstructive sleep apnea (adult) (pediatric): Secondary | ICD-10-CM

## 2019-12-30 DIAGNOSIS — K5900 Constipation, unspecified: Secondary | ICD-10-CM

## 2019-12-30 DIAGNOSIS — Z789 Other specified health status: Secondary | ICD-10-CM

## 2019-12-30 DIAGNOSIS — N184 Chronic kidney disease, stage 4 (severe): Secondary | ICD-10-CM | POA: Diagnosis not present

## 2019-12-30 DIAGNOSIS — D649 Anemia, unspecified: Secondary | ICD-10-CM

## 2019-12-30 DIAGNOSIS — R739 Hyperglycemia, unspecified: Secondary | ICD-10-CM

## 2019-12-30 DIAGNOSIS — E559 Vitamin D deficiency, unspecified: Secondary | ICD-10-CM | POA: Diagnosis not present

## 2019-12-30 DIAGNOSIS — F32A Depression, unspecified: Secondary | ICD-10-CM

## 2019-12-30 DIAGNOSIS — M549 Dorsalgia, unspecified: Secondary | ICD-10-CM | POA: Diagnosis not present

## 2019-12-30 DIAGNOSIS — E038 Other specified hypothyroidism: Secondary | ICD-10-CM | POA: Diagnosis not present

## 2019-12-30 DIAGNOSIS — F329 Major depressive disorder, single episode, unspecified: Secondary | ICD-10-CM

## 2019-12-30 DIAGNOSIS — N289 Disorder of kidney and ureter, unspecified: Secondary | ICD-10-CM

## 2019-12-30 DIAGNOSIS — Z79899 Other long term (current) drug therapy: Secondary | ICD-10-CM | POA: Diagnosis not present

## 2019-12-30 DIAGNOSIS — R69 Illness, unspecified: Secondary | ICD-10-CM | POA: Diagnosis not present

## 2019-12-30 NOTE — Patient Instructions (Signed)
Miralax/Glycolax mixed with Benefiber daily or twice daily  60-90 ounces of beverages/water

## 2019-12-31 ENCOUNTER — Other Ambulatory Visit: Payer: Self-pay | Admitting: Family Medicine

## 2019-12-31 ENCOUNTER — Telehealth: Payer: Self-pay | Admitting: *Deleted

## 2019-12-31 DIAGNOSIS — E038 Other specified hypothyroidism: Secondary | ICD-10-CM

## 2019-12-31 DIAGNOSIS — H353211 Exudative age-related macular degeneration, right eye, with active choroidal neovascularization: Secondary | ICD-10-CM | POA: Diagnosis not present

## 2019-12-31 DIAGNOSIS — H43822 Vitreomacular adhesion, left eye: Secondary | ICD-10-CM | POA: Diagnosis not present

## 2019-12-31 DIAGNOSIS — H353122 Nonexudative age-related macular degeneration, left eye, intermediate dry stage: Secondary | ICD-10-CM | POA: Diagnosis not present

## 2019-12-31 LAB — LIPID PANEL
Cholesterol: 165 mg/dL (ref 0–200)
HDL: 47 mg/dL (ref >39.00)
LDL Cholesterol: 86 mg/dL (ref 0–99)
NonHDL: 117.51
Total CHOL/HDL Ratio: 4
Triglycerides: 156 mg/dL — ABNORMAL HIGH (ref 0.0–149.0)
VLDL: 31.2 mg/dL (ref 0.0–40.0)

## 2019-12-31 LAB — COMPREHENSIVE METABOLIC PANEL
ALT: 16 U/L (ref 0–35)
AST: 21 U/L (ref 0–37)
Albumin: 4.1 g/dL (ref 3.5–5.2)
Alkaline Phosphatase: 47 U/L (ref 39–117)
BUN: 24 mg/dL — ABNORMAL HIGH (ref 6–23)
CO2: 31 mEq/L (ref 19–32)
Calcium: 9.6 mg/dL (ref 8.4–10.5)
Chloride: 106 mEq/L (ref 96–112)
Creatinine, Ser: 2.05 mg/dL — ABNORMAL HIGH (ref 0.40–1.20)
GFR: 23.81 mL/min — ABNORMAL LOW (ref >60.00)
Glucose, Bld: 79 mg/dL (ref 70–99)
Potassium: 4.7 mEq/L (ref 3.5–5.1)
Sodium: 141 mEq/L (ref 135–145)
Total Bilirubin: 0.5 mg/dL (ref 0.2–1.2)
Total Protein: 6.4 g/dL (ref 6.0–8.3)

## 2019-12-31 LAB — CBC
HCT: 37.9 % (ref 36.0–46.0)
Hemoglobin: 12.7 g/dL (ref 12.0–15.0)
MCHC: 33.4 g/dL (ref 30.0–36.0)
MCV: 89.5 fl (ref 78.0–100.0)
Platelets: 239 10*3/uL (ref 150.0–400.0)
RBC: 4.23 Mil/uL (ref 3.87–5.11)
RDW: 13 % (ref 11.5–15.5)
WBC: 6 10*3/uL (ref 4.0–10.5)

## 2019-12-31 LAB — HEMOGLOBIN A1C: Hgb A1c MFr Bld: 5.2 % (ref 4.6–6.5)

## 2019-12-31 LAB — TSH: TSH: 0.12 u[IU]/mL — ABNORMAL LOW (ref 0.35–4.50)

## 2019-12-31 MED ORDER — LEVOTHYROXINE SODIUM 112 MCG PO TABS: 112.0000 ug | ORAL_TABLET | Freq: Every day | ORAL | 3 refills | Status: DC

## 2019-12-31 NOTE — Assessment & Plan Note (Signed)
Hydrate and monitor 

## 2019-12-31 NOTE — Telephone Encounter (Signed)
Patient notified

## 2019-12-31 NOTE — Assessment & Plan Note (Signed)
hgba1c acceptable, minimize simple carbs. Increase exercise as tolerated.  

## 2019-12-31 NOTE — Assessment & Plan Note (Addendum)
Has noted more trouble with small, harder less frequent bowel movements. Encouraged to increase water intake to 60-80 ounces daily add Miralax with benefiber daily to 2 x daily. Encouraged increased hydration and fiber in diet. Daily probiotics. If bowels not moving can use MOM 2 tbls po in 4 oz of warm prune juice by mouth every 2-3 days. If no results then repeat in 4 hours with  Dulcolax suppository pr, may repeat again in 4 more hours as needed. Seek care if symptoms worsen. Consider daily Miralax and/or Dulcolax if symptoms persist.

## 2019-12-31 NOTE — Progress Notes (Addendum)
Subjective:    Patient ID: Emily Mitchell, female    DOB: November 27, 1945, 74 y.o.   MRN: [id number]  Chief Complaint  Patient presents with  . Follow-up    HPI Patient is in today for follow up on chronic medical concerns. No recent febrile illness or hospitalizations. She did have an episode of abscess under her right breast that required lancing but it is resolved now. Is trying to eat a heart healthy diet and stay active. Denies CP/palp/SOB/HA/congestion/fevers/GI or GU c/o. Taking meds as prescribed  Past Medical History:  Diagnosis Date  . Anemia 07/05/2014  . Anxiety and depression 12/14/2008   Qualifier: Diagnosis of  By: [name] LPN, Almyra Free    . Arthritis    "fingers, toes, knees, joints" (07/18/2018)  . Asthma   . Atypical chest pain 05/23/2015  . CAD (coronary artery disease)    a. [place] with cath 06/2018 s/p DES to LAD.  Marland Kitchen Carotid artery occlusion    a. R carotid occluded, 90-24% LICA.  Marland Kitchen CHF (congestive heart failure) ([place])   . Chicken pox as a child  . Chronic kidney disease    Bright's Disease at age 63   . COPD (chronic obstructive pulmonary disease) ([place])   . GERD (gastroesophageal reflux disease)   . H. pylori infection 10/19/2013  . Hepatitis 1970s   "don't know for sure which one it was; think it was A" (07/18/2018)  . Hyperglycemia 03/14/2016  . Hyperlipidemia   . Hypertension   . Hypothyroidism   . Knee pain, bilateral 12/17/2012  . Left hip pain 05/23/2015  . Low back pain 09/19/2015  . Macular degeneration of both eyes 12/16/2015   Right eye is wet Left Eye is dry Shots every 11 to 12 weeks in right eye at opthamologist office  . Medicare annual wellness visit, subsequent 02/21/2015  . Mumps 74 yrs old  . Pneumonia    "at least once" (07/18/2018)  . Preventative health care 03/14/2016  . Pulmonary emboli ([place]) 05/26/2013  . Sleep apnea    "don't use CPAP anymore; dr said I didn't have to" (07/18/2018)  . Stroke [facility])    "been told I've had some strokes;  didn't know I'd had them" (07/18/2018)  . Tremor   . UTI (lower urinary tract infection) 10/19/2013    Past Surgical History:  Procedure Laterality Date  . APPENDECTOMY  1984  . CARDIAC CATHETERIZATION  ~ 2006  . CAROTID ANGIOGRAPHY Left 08/20/2018   Procedure: CAROTID ANGIOGRAPHY;  Surgeon: Serafina Mitchell, MD;  Location: [place] [facility];  Service: Cardiovascular;  Laterality: Left;  . CAROTID ENDARTERECTOMY Right 05/10/07   cea  . CATARACT EXTRACTION Left   . CATARACT EXTRACTION W/ INTRAOCULAR LENS  IMPLANT, BILATERAL Bilateral   . CORONARY ANGIOPLASTY WITH STENT PLACEMENT  07/18/2018  . CORONARY STENT INTERVENTION N/A 07/18/2018   Procedure: CORONARY STENT INTERVENTION;  Surgeon: Lorretta Harp, MD;  Location: [place] [facility];  Service: Cardiovascular;  Laterality: N/A;  . INTRAVASCULAR PRESSURE WIRE/FFR STUDY N/A 07/18/2018   Procedure: INTRAVASCULAR PRESSURE WIRE/FFR STUDY;  Surgeon: Lorretta Harp, MD;  Location: [place] [facility];  Service: Cardiovascular;  Laterality: N/A;  . JOINT REPLACEMENT    . LEFT HEART CATH AND CORONARY ANGIOGRAPHY N/A 07/18/2018   Procedure: LEFT HEART CATH AND CORONARY ANGIOGRAPHY;  Surgeon: Lorretta Harp, MD;  Location: [place] [facility];  Service: Cardiovascular;  Laterality: N/A;  . MULTIPLE TOOTH EXTRACTIONS    . TOTAL KNEE ARTHROPLASTY  Bilateral 2005 - 2011   right - left  . VAGINAL HYSTERECTOMY  1984  . WISDOM TOOTH EXTRACTION      Family History  Problem Relation Age of Onset  . Stroke Mother        mini stroke  . Kidney disease Mother   . Heart failure Mother   . Hypertension Mother   . Diabetes Sister        type 2  . Kidney disease Sister   . Allergic rhinitis Sister   . Heart attack Maternal Grandfather   . Hyperlipidemia Sister   . Colon cancer Neg Hx     Social History   Socioeconomic History  . Marital status: Divorced    Spouse name: Not on file  . Number of children: 2  . Years of education: Not on  file  . Highest education level: Not on file  Occupational History  . Not on file  Tobacco Use  . Smoking status: Former Smoker    Packs/day: 2.00    Years: 30.00    Pack years: 60.00    Types: Cigarettes    Quit date: 12/03/1994    Years since quitting: 25.2  . Smokeless tobacco: Never Used  Vaping Use  . Vaping Use: Never used  Substance and Sexual Activity  . Alcohol use: Yes    Comment: 07/18/2018 "1 drink q 1-2 months; if that"  . Drug use: Never  . Sexual activity: Not Currently    Comment: lives with son and friend, no dietary restrictions  Other Topics Concern  . Not on file  Social History Narrative   Right handed   No caffeine   One story home   Social Determinants of Health   Financial Resource Strain:   . Difficulty of Paying Living Expenses:   Food Insecurity:   . Worried About Charity fundraiser in the Last Year:   . Arboriculturist in the Last Year:   Transportation Needs:   . Film/video editor (Medical):   Marland Kitchen Lack of Transportation (Non-Medical):   Physical Activity:   . Days of Exercise per Week:   . Minutes of Exercise per Session:   Stress:   . Feeling of Stress :   Social Connections:   . Frequency of Communication with Friends and Family:   . Frequency of Social Gatherings with Friends and Family:   . Attends Religious Services:   . Active Member of Clubs or Organizations:   . Attends Archivist Meetings:   Marland Kitchen Marital Status:   Intimate Partner Violence:   . Fear of Current or Ex-Partner:   . Emotionally Abused:   Marland Kitchen Physically Abused:   . Sexually Abused:     Outpatient Medications Prior to Visit  Medication Sig Dispense Refill  . aspirin EC 81 MG tablet Take 1 tablet (81 mg total) by mouth daily. 90 tablet 3  . Calcium Citrate-Vitamin D (CALCIUM + D PO) Take 1 tablet by mouth daily.    . Cholecalciferol (D3 MAXIMUM STRENGTH) 5000 UNITS capsule Take 5,000 Units by mouth daily.    . Choline Fenofibrate (FENOFIBRIC ACID) 135  MG CPDR TAKE ONE CAPSULE BY MOUTH DAILY 90 capsule 0  . clopidogrel (PLAVIX) 75 MG tablet TAKE ONE TABLET BY MOUTH DAILY 90 tablet 1  . ezetimibe (ZETIA) 10 MG tablet TAKE ONE TABLET BY MOUTH DAILY 90 tablet 1  . famotidine (PEPCID) 40 MG tablet Take 1 tablet (40 mg total) by mouth daily. [place]  tablet 1  . FLUoxetine (PROZAC) 20 MG tablet Take 1 tablet (20 mg total) by mouth daily. 90 tablet 1  . gabapentin (NEURONTIN) 100 MG capsule TAKE TWO CAPSULES BY MOUTH EVERY NIGHT AT BEDTIME 180 capsule 0  . Lactobacillus (DIGESTIVE HEALTH PROBIOTIC PO) Take 1 capsule by mouth daily.    . montelukast (SINGULAIR) 10 MG tablet Take 1 tablet (10 mg total) by mouth at bedtime as needed. 90 tablet 3  . Multiple Vitamins-Minerals (PRESERVISION AREDS PO) Take 1 capsule by mouth daily.    . Omega-3 Fatty Acids (FISH OIL PO) Take 1 capsule by mouth daily.    . vitamin B-12 (CYANOCOBALAMIN) 500 MCG tablet Take 500 mcg by mouth daily.     Marland Kitchen albuterol (PROVENTIL HFA;VENTOLIN HFA) 108 (90 Base) MCG/ACT inhaler Inhale 2 puffs into the lungs every 6 (six) hours as needed for wheezing or shortness of breath.    . carvedilol (COREG) 6.25 MG tablet TAKE ONE TABLET BY MOUTH TWICE A DAY WITH A MEAL 180 tablet 0  . clonazePAM (KLONOPIN) 1 MG tablet TAKE ONE TABLET BY MOUTH THREE TIMES A DAY AS NEEDED FOR ANXIETY 90 tablet 0  . levothyroxine (SYNTHROID) 125 MCG tablet TAKE ONE TABLET BY MOUTH DAILY BEFORE BREAKFAST 90 tablet 0  . SYMBICORT 80-4.5 MCG/ACT inhaler INHALE TWO PUFFS BY MOUTH TWICE A DAY 30.6 g 2  . acetaminophen (TYLENOL) 500 MG tablet Take 500 mg by mouth every 6 (six) hours as needed for moderate pain.     Marland Kitchen amLODipine (NORVASC) 2.5 MG tablet Take 1 tablet (2.5 mg total) by mouth daily. 90 tablet 3  . bevacizumab (AVASTIN) 400 MG/16ML SOLN     . cyclobenzaprine (FLEXERIL) 10 MG tablet Take 1 tablet (10 mg total) by mouth 2 (two) times daily as needed. 20 tablet 0  . FISH OIL-KRILL OIL PO Take 1,000 mg by mouth  daily.     Marland Kitchen lidocaine (LIDODERM) 5 % Place 1 patch onto the skin daily. Remove & Discard patch within 12 hours or as directed by MD 15 patch 0  . Multiple Vitamins-Minerals (KP VISION FORMULA) TABS daily.    . nitroGLYCERIN (NITROSTAT) 0.4 MG SL tablet Place 1 tablet (0.4 mg total) under the tongue every 5 (five) minutes as needed for chest pain. 90 tablet 3  . traMADol (ULTRAM) 50 MG tablet Take 1 tablet (50 mg total) by mouth every 12 (twelve) hours as needed for moderate pain or severe pain. 30 tablet 0   No facility-administered medications prior to visit.    Allergies  Allergen Reactions  . Niaspan [Niacin Er] Nausea And Vomiting and Swelling    Swelling in mouth  . Pantoprazole     Mouth sores  . Bupropion Other (See Comments)    Uncontrollable shakes  . Nitroglycerin     NOT allergic - cardiology has recommended she NOT use this due to h/o severe carotid disease as it may decrease cerebral perfusion  . Penicillins Hives    DID THE REACTION INVOLVE: Swelling of the face/tongue/throat, SOB, or low BP? Yes Sudden or severe rash/hives, skin peeling, or the inside of the mouth or nose? Unknown Did it require medical treatment? Unknown When did it last happen?[age]  If all above answers are "NO", may proceed with cephalosporin use.  . Statins   . Sulfonamide Derivatives Hives  . Apple Rash  . Banana Rash    Review of Systems  Constitutional: Negative for fever and malaise/fatigue.  HENT: Negative for congestion.  Eyes: Negative for blurred vision.  Respiratory: Negative for shortness of breath.   Cardiovascular: Negative for chest pain, palpitations and leg swelling.  Gastrointestinal: Negative for abdominal pain, blood in stool and nausea.  Genitourinary: Negative for dysuria and frequency.  Musculoskeletal: Negative for falls.  Skin: Negative for rash.  Neurological: Negative for dizziness, loss of consciousness and headaches.  Endo/Heme/Allergies: Negative for  environmental allergies.  Psychiatric/Behavioral: Negative for depression. The patient is not nervous/anxious.        Objective:    Physical Exam Vitals and nursing note reviewed.  Constitutional:      General: She is not in acute distress.    Appearance: She is well-developed.  HENT:     Head: Normocephalic and atraumatic.     Nose: Nose normal.  Eyes:     General:        Right eye: No discharge.        Left eye: No discharge.  Cardiovascular:     Rate and Rhythm: Normal rate and regular rhythm.     Heart sounds: No murmur.  Pulmonary:     Effort: Pulmonary effort is normal.     Breath sounds: Normal breath sounds.  Abdominal:     General: Bowel sounds are normal.     Palpations: Abdomen is soft.     Tenderness: There is no abdominal tenderness.  Musculoskeletal:     Cervical back: Normal range of motion and neck supple.  Skin:    General: Skin is warm and dry.  Neurological:     Mental Status: She is alert and oriented to person, place, and time.     BP 110/60 (BP Location: Left Arm, Cuff Size: Large)   Pulse 68   Temp 98.7 F (37.1 C) (Temporal)   Resp 16   Ht 5\' 3"  (1.6 m)   Wt 188 lb 3.2 oz (85.4 kg)   SpO2 97%   BMI 33.34 kg/m  Wt Readings from Last 3 Encounters:  01/02/20 188 lb (85.3 kg)  12/30/19 188 lb 3.2 oz (85.4 kg)  10/20/19 186 lb (84.4 kg)    Diabetic Foot Exam - Simple   No data filed     Lab Results  Component Value Date   WBC 6.0 12/30/2019   HGB 12.7 12/30/2019   HCT 37.9 12/30/2019   PLT 239.0 12/30/2019   GLUCOSE 79 12/30/2019   CHOL 165 12/30/2019   TRIG 156.0 (H) 12/30/2019   HDL 47.00 12/30/2019   LDLDIRECT 103.0 07/14/2019   LDLCALC 86 12/30/2019   ALT 16 12/30/2019   AST 21 12/30/2019   NA 141 12/30/2019   K 4.7 12/30/2019   CL 106 12/30/2019   CREATININE 2.05 (H) 12/30/2019   BUN 24 (H) 12/30/2019   CO2 31 12/30/2019   TSH 0.12 (L) 12/30/2019   INR 1.79 07/18/2018   HGBA1C 5.2 12/30/2019    Lab Results    Component Value Date   TSH 0.12 (L) 12/30/2019   Lab Results  Component Value Date   WBC 6.0 12/30/2019   HGB 12.7 12/30/2019   HCT 37.9 12/30/2019   MCV 89.5 12/30/2019   PLT 239.0 12/30/2019   Lab Results  Component Value Date   NA 141 12/30/2019   K 4.7 12/30/2019   CO2 31 12/30/2019   GLUCOSE 79 12/30/2019   BUN 24 (H) 12/30/2019   CREATININE 2.05 (H) 12/30/2019   BILITOT 0.5 12/30/2019   ALKPHOS 47 12/30/2019   AST 21 12/30/2019   ALT 16  12/30/2019   PROT 6.4 12/30/2019   ALBUMIN 4.1 12/30/2019   CALCIUM 9.6 12/30/2019   ANIONGAP 13 03/15/2019   GFR 23.81 (L) 12/30/2019   Lab Results  Component Value Date   CHOL 165 12/30/2019   Lab Results  Component Value Date   HDL 47.00 12/30/2019   Lab Results  Component Value Date   LDLCALC 86 12/30/2019   Lab Results  Component Value Date   TRIG 156.0 (H) 12/30/2019   Lab Results  Component Value Date   CHOLHDL 4 12/30/2019   Lab Results  Component Value Date   HGBA1C 5.2 12/30/2019       Assessment & Plan:   Problem List Items Addressed This Visit    Anxiety and depression - Primary    Is doing well on current meds and is managing the pandemic well. No changes in therapy      Relevant Orders   Drug Tox Monitor 1 w/Conf, Oral Fld (Completed)   Constipation    Has noted more trouble with small, harder less frequent bowel movements. Encouraged to increase water intake to 60-80 ounces daily add Miralax with benefiber daily to 2 x daily. Encouraged increased hydration and fiber in diet. Daily probiotics. If bowels not moving can use MOM 2 tbls po in 4 oz of warm prune juice by mouth every 2-3 days. If no results then repeat in 4 hours with  Dulcolax suppository pr, may repeat again in 4 more hours as needed. Seek care if symptoms worsen. Consider daily Miralax and/or Dulcolax if symptoms persist.       OSA (obstructive sleep apnea)   CKD (chronic kidney disease), stage IV (HCC)   Relevant Orders    Comprehensive metabolic panel (Completed)   Hypothyroidism    TSH suppressed will drop the dosing of Levothyroxine to 112 mcg daily      Relevant Medications   levothyroxine (SYNTHROID) 112 MCG tablet   Other Relevant Orders   TSH (Completed)   Hyperlipidemia, mixed    Encouraged heart healthy diet, increase exercise, avoid trans fats, consider a krill oil cap daily. Does not tolerate statins but is tolerating Zetia and Fenofibrate.       Relevant Orders   Lipid panel (Completed)   Anemia   Relevant Orders   CBC (Completed)   Comprehensive metabolic panel (Completed)   TSH (Completed)   Hyperglycemia    hgba1c acceptable, minimize simple carbs. Increase exercise as tolerated.       Relevant Orders   Hemoglobin A1c (Completed)   Vitamin D deficiency    Supplement and monitor      Renal insufficiency    Hydrate and monitor      Back pain   Relevant Orders   Drug Tox Monitor 1 w/Conf, Oral Fld (Completed)   Statin intolerance    Unable and unwilling to take statins due to previous bad reactions       Other Visit Diagnoses    High risk medication use       Relevant Orders   Drug Tox Monitor 1 w/Conf, Oral Fld (Completed)      I have discontinued Pearline Cables. Spohr's acetaminophen, FISH OIL-KRILL OIL PO, nitroGLYCERIN, lidocaine, cyclobenzaprine, bevacizumab, KP Vision Formula, amLODipine, traMADol, and levothyroxine. I am also having her start on levothyroxine. Additionally, I am having her maintain her Cholecalciferol, vitamin B-12, aspirin EC, montelukast, famotidine, FLUoxetine, ezetimibe, clopidogrel, Fenofibric Acid, gabapentin, Omega-3 Fatty Acids (FISH OIL PO), Lactobacillus (DIGESTIVE HEALTH PROBIOTIC PO), Multiple Vitamins-Minerals (PRESERVISION AREDS  PO), and Calcium Citrate-Vitamin D (CALCIUM + D PO).  Meds ordered this encounter  Medications  . levothyroxine (SYNTHROID) 112 MCG tablet    Sig: Take 1 tablet (112 mcg total) by mouth daily.    Dispense:  30  tablet    Refill:  3     Penni Homans, MD

## 2019-12-31 NOTE — Assessment & Plan Note (Signed)
TSH suppressed will drop the dosing of Levothyroxine to 112 mcg daily

## 2019-12-31 NOTE — Assessment & Plan Note (Signed)
Is doing well on current meds and is managing the pandemic well. No changes in therapy

## 2019-12-31 NOTE — Telephone Encounter (Signed)
Per Dr. Charlett Blake she sent in her new dose of Levothyroxine 112 just let her know.

## 2019-12-31 NOTE — Assessment & Plan Note (Signed)
Supplement and monitor 

## 2019-12-31 NOTE — Assessment & Plan Note (Addendum)
Encouraged heart healthy diet, increase exercise, avoid trans fats, consider a krill oil cap daily. Does not tolerate statins but is tolerating Zetia and Fenofibrate.

## 2020-01-01 NOTE — Progress Notes (Signed)
Assessment/Plan:    1.  Essential Tremor  -already on coreg but really not candidate for high dose beta blocker due to lung function  -discussed primidone.  She doesn't really want any medication.  She really wanted reassurance again that she didn't have Parkinsons Disease. I see no evidence of that and she was pleased  -now off of coumadin so if need primidone won't have that interaction  -discussed weighted gloves/spoons/forks and showed her where to purchase  2.  Renal insuff  -following with nephrology  3.  Hypothyroid  -currently overtreated.  PCP just decreased synthroid.  In theory, could make tremor worse (the overtx)  4.  F/u prn  Subjective:   Emily Mitchell was seen today in follow up for essential tremor.  My previous records were reviewed prior to todays visit.  Patient has not been seen in almost 3 years (August, 2018).  At that point in time, she is being followed for asymmetric essential tremor, but was on no medication.  She was drinking a lot of diet Dr. Malachi Bonds.  She is drinking less of that now.  Tremor is getting worse - "that's what my kidney doctor says."  Pt states that sometimes it bothers her and sometimes she has to change to a spoon from a fork.  Doesn't interfere with ADL's usually.  The R hand shakes more than the L.  Medications potentially exacerbating tremor: Albuterol (uses 1 time per month)/Singulair  Current prescribed movement disorder medications: None (not beta-blocker candidate because of her COPD) - already on coreg 6.25 mg bid Is on clonazepam, prescribed as 1 mg 3 times per day but pt states uses just 1 time q hs   ALLERGIES:   Allergies  Allergen Reactions  . Niaspan [Niacin Er] Nausea And Vomiting and Swelling    Swelling in mouth  . Pantoprazole     Mouth sores  . Bupropion Other (See Comments)    Uncontrollable shakes  . Nitroglycerin     NOT allergic - cardiology has recommended she NOT use this due to h/o severe carotid  disease as it may decrease cerebral perfusion  . Penicillins Hives    DID THE REACTION INVOLVE: Swelling of the face/tongue/throat, SOB, or low BP? Yes Sudden or severe rash/hives, skin peeling, or the inside of the mouth or nose? Unknown Did it require medical treatment? Unknown When did it last happen?[age]  If all above answers are "NO", may proceed with cephalosporin use.  . Statins   . Sulfonamide Derivatives Hives  . Apple Rash  . Banana Rash    CURRENT MEDICATIONS:  Outpatient Encounter Medications as of 01/02/2020  Medication Sig  . acetaminophen (TYLENOL) 500 MG tablet Take by mouth.  Marland Kitchen albuterol (PROVENTIL HFA;VENTOLIN HFA) 108 (90 Base) MCG/ACT inhaler Inhale 2 puffs into the lungs every 6 (six) hours as needed for wheezing or shortness of breath.  Marland Kitchen aMILoride (MIDAMOR) 5 MG tablet Take by mouth.  Marland Kitchen amLODipine (NORVASC) 2.5 MG tablet Take by mouth.  Marland Kitchen aspirin EC 81 MG tablet Take 1 tablet (81 mg total) by mouth daily.  . Bevacizumab (AVASTIN) 100 MG/4ML SOLN   . Calcium Citrate-Vitamin D (CALCIUM + D PO) Take 1 tablet by mouth daily.  . calcium citrate-vitamin D (CITRACAL+D) 315-200 MG-UNIT tablet Take by mouth.  . carvedilol (COREG) 6.25 MG tablet TAKE ONE TABLET BY MOUTH TWICE A DAY WITH A MEAL  . Cholecalciferol (D3 MAXIMUM STRENGTH) 5000 UNITS capsule Take 5,000 Units by mouth daily.  Marland Kitchen  Choline Fenofibrate (FENOFIBRIC ACID) 135 MG CPDR TAKE ONE CAPSULE BY MOUTH DAILY  . clonazePAM (KLONOPIN) 1 MG tablet TAKE ONE TABLET BY MOUTH THREE TIMES A DAY AS NEEDED FOR ANXIETY  . clopidogrel (PLAVIX) 75 MG tablet TAKE ONE TABLET BY MOUTH DAILY  . ezetimibe (ZETIA) 10 MG tablet TAKE ONE TABLET BY MOUTH DAILY  . famotidine (PEPCID) 40 MG tablet Take 1 tablet (40 mg total) by mouth daily.  Marland Kitchen FLUoxetine (PROZAC) 20 MG tablet Take 1 tablet (20 mg total) by mouth daily.  Marland Kitchen gabapentin (NEURONTIN) 100 MG capsule TAKE TWO CAPSULES BY MOUTH EVERY NIGHT AT BEDTIME  . Lactobacillus  (DIGESTIVE HEALTH PROBIOTIC PO) Take 1 capsule by mouth daily.  Marland Kitchen levothyroxine (SYNTHROID) 112 MCG tablet Take 1 tablet (112 mcg total) by mouth daily.  . montelukast (SINGULAIR) 10 MG tablet Take 1 tablet (10 mg total) by mouth at bedtime as needed.  . Multiple Vitamins-Minerals (PRESERVISION AREDS PO) Take 1 capsule by mouth daily.  . Omega-3 Fatty Acids (FISH OIL PO) Take 1 capsule by mouth daily.  . SYMBICORT 80-4.5 MCG/ACT inhaler INHALE TWO PUFFS BY MOUTH TWICE A DAY  . vitamin B-12 (CYANOCOBALAMIN) 500 MCG tablet Take 500 mcg by mouth daily.    No facility-administered encounter medications on file as of 01/02/2020.     Objective:    PHYSICAL EXAMINATION:    VITALS:   Vitals:   01/02/20 1432  BP: 109/69  Pulse: 70  Resp: 18  SpO2: 95%  Weight: 188 lb (85.3 kg)  Height: 5\' 3"  (1.6 m)    GEN:  The patient appears stated age and is in NAD. HEENT:  Normocephalic, atraumatic.  The mucous membranes are moist. The superficial temporal arteries are without ropiness or tenderness. CV:  RRR Lungs:  CTAB.  She has some DOE Neck/HEME:  There are no carotid bruits bilaterally.  Neurological examination:  Orientation: The patient is alert and oriented x3. Cranial nerves: There is good facial symmetry. The speech is fluent and clear. Soft palate rises symmetrically and there is no tongue deviation. Hearing is intact to conversational tone. Sensation: Sensation is intact to light touch throughout Motor: Strength is at least antigravity x4.  Movement examination: Tone: There is normal tone in the UE/LE Abnormal movements: no rest tremor.  There is postural tremor, R>L.  she has mild difficulty with archimedes spirals.  she has mild difficulty when asked to pour a full glass of water from one glass to another - tremor is evident.  Mild head tremor in the "yes" direction Coordination:  There is no decremation with RAM's, with any form of RAMS, including alternating supination and  pronation of the forearm, hand opening and closing, finger taps, heel taps and toe taps. Gait and Station: The patient has no difficulty arising out of a deep-seated chair without the use of the hands. The patient's stride length is good I have reviewed and interpreted the following labs independently   Chemistry      Component Value Date/Time   NA 141 12/30/2019 1438   NA 140 07/17/2018 1508   K 4.7 12/30/2019 1438   CL 106 12/30/2019 1438   CO2 31 12/30/2019 1438   BUN 24 (H) 12/30/2019 1438   BUN 37 (H) 07/17/2018 1508   CREATININE 2.05 (H) 12/30/2019 1438   CREATININE 2.45 (H) 12/12/2013 1521      Component Value Date/Time   CALCIUM 9.6 12/30/2019 1438   CALCIUM 9.5 11/25/2009 1346   ALKPHOS 47 12/30/2019 1438  AST 21 12/30/2019 1438   ALT 16 12/30/2019 1438   BILITOT 0.5 12/30/2019 1438      Lab Results  Component Value Date   WBC 6.0 12/30/2019   HGB 12.7 12/30/2019   HCT 37.9 12/30/2019   MCV 89.5 12/30/2019   PLT 239.0 12/30/2019   Lab Results  Component Value Date   TSH 0.12 (L) 12/30/2019     Chemistry      Component Value Date/Time   NA 141 12/30/2019 1438   NA 140 07/17/2018 1508   K 4.7 12/30/2019 1438   CL 106 12/30/2019 1438   CO2 31 12/30/2019 1438   BUN 24 (H) 12/30/2019 1438   BUN 37 (H) 07/17/2018 1508   CREATININE 2.05 (H) 12/30/2019 1438   CREATININE 2.45 (H) 12/12/2013 1521      Component Value Date/Time   CALCIUM 9.6 12/30/2019 1438   CALCIUM 9.5 11/25/2009 1346   ALKPHOS 47 12/30/2019 1438   AST 21 12/30/2019 1438   ALT 16 12/30/2019 1438   BILITOT 0.5 12/30/2019 1438         Total time spent on today's visit was 30 minutes, including both face-to-face time and nonface-to-face time.  Time included that spent on review of records (prior notes available to me/labs/imaging if pertinent), discussing treatment and goals, answering patient's questions and coordinating care.  Cc:  Mosie Lukes, MD

## 2020-01-02 ENCOUNTER — Ambulatory Visit: Payer: Medicare HMO | Admitting: Neurology

## 2020-01-02 ENCOUNTER — Other Ambulatory Visit: Payer: Self-pay

## 2020-01-02 ENCOUNTER — Encounter: Payer: Self-pay | Admitting: Neurology

## 2020-01-02 VITALS — BP 109/69 | HR 70 | Resp 18 | Ht 63.0 in | Wt 188.0 lb

## 2020-01-02 DIAGNOSIS — N289 Disorder of kidney and ureter, unspecified: Secondary | ICD-10-CM

## 2020-01-02 DIAGNOSIS — E039 Hypothyroidism, unspecified: Secondary | ICD-10-CM | POA: Diagnosis not present

## 2020-01-02 DIAGNOSIS — G250 Essential tremor: Secondary | ICD-10-CM

## 2020-01-02 NOTE — Patient Instructions (Signed)
1.  Try the weighted gloves 2.  If you change your mind on trying tremor medication, you can let me know 3.  You do NOT have Parkinsons Disease   The physicians and staff at [facility] are committed to providing excellent care. You may receive a survey requesting feedback about your experience at our office. We strive to receive "very good" responses to the survey questions. If you feel that your experience would prevent you from giving the office a "very good " response, please contact our office to try to remedy the situation. We may be reached at [phone]. Thank you for taking the time out of your busy day to complete the survey.

## 2020-01-03 LAB — DRUG TOX MONITOR 1 W/CONF, ORAL FLD

## 2020-01-27 DIAGNOSIS — N2581 Secondary hyperparathyroidism of renal origin: Secondary | ICD-10-CM | POA: Diagnosis not present

## 2020-01-27 DIAGNOSIS — N1832 Chronic kidney disease, stage 3b: Secondary | ICD-10-CM | POA: Diagnosis not present

## 2020-01-27 DIAGNOSIS — N189 Chronic kidney disease, unspecified: Secondary | ICD-10-CM | POA: Diagnosis not present

## 2020-01-28 ENCOUNTER — Ambulatory Visit (INDEPENDENT_AMBULATORY_CARE_PROVIDER_SITE_OTHER): Payer: Medicare HMO | Admitting: Pulmonary Disease

## 2020-01-28 ENCOUNTER — Other Ambulatory Visit: Payer: Self-pay

## 2020-01-28 ENCOUNTER — Encounter: Payer: Self-pay | Admitting: Pulmonary Disease

## 2020-01-28 ENCOUNTER — Telehealth: Payer: Self-pay | Admitting: Pulmonary Disease

## 2020-01-28 DIAGNOSIS — J4489 Other specified chronic obstructive pulmonary disease: Secondary | ICD-10-CM

## 2020-01-28 DIAGNOSIS — J449 Chronic obstructive pulmonary disease, unspecified: Secondary | ICD-10-CM

## 2020-01-28 DIAGNOSIS — N184 Chronic kidney disease, stage 4 (severe): Secondary | ICD-10-CM | POA: Diagnosis not present

## 2020-01-28 MED ORDER — ALBUTEROL SULFATE HFA 108 (90 BASE) MCG/ACT IN AERS: 2.0000 | INHALATION_SPRAY | Freq: Four times a day (QID) | RESPIRATORY_TRACT | 3 refills | Status: DC | PRN

## 2020-01-28 MED ORDER — BREZTRI AEROSPHERE 160-9-4.8 MCG/ACT IN AERO: 2.0000 | INHALATION_SPRAY | Freq: Two times a day (BID) | RESPIRATORY_TRACT | 3 refills | Status: DC

## 2020-01-28 NOTE — Assessment & Plan Note (Signed)
We will step up therapy from Symbicort to [facility] .  Prescription will be sent in stable call us back if there are any side effects.  Refills will be provided with the albuterol

## 2020-01-28 NOTE — Addendum Note (Signed)
Addended byLuanna Salk on: 01/28/2020 12:09 PM   Modules accepted: Orders

## 2020-01-28 NOTE — Assessment & Plan Note (Signed)
Differential diagnosis could be fluid retention related to worsening renal failure, she does have CKD stage IV and is not on a diuretic but states that weight is unchanged

## 2020-01-28 NOTE — Telephone Encounter (Signed)
Medication name and strength: Breztri Provider: [facility] Pharmacy: [name] Patient insurance ID: [employer] Phone: [phone] Fax: [phone]  Was the PA started on CMM?  yes If yes, please enter the Key: [id number] Timeframe for approval/denial: Your information has been submitted to [place] Medicare Part D. [employer] Part D will review the request and will issue a decision, typically within 1-3 days from your submission. You can check the updated outcome later by reopening this request.  If [employer] has not responded in 1-3 days or if you have any questions about your ePA request, please contact [place] Medicare Part D at [phone].  Routing to [place] to follow up on.

## 2020-01-28 NOTE — Progress Notes (Signed)
   Subjective:    Patient ID: Emily Mitchell, female    DOB: 06/29/1946, 74 y.o.   MRN: [id number]  HPI  I connected with  Emily Mitchell on 01/28/20 by phone and verified that I am speaking with the correct person using two identifiers.  Location -I was at [place] pulmonary office and patient was at home  I discussed the limitations of evaluation and management by telemedicine and that there may be a charge for this service.  The patient expressed understanding and agreed to proceed.    36 yoex smoker  for FU of  COPD with asthma PMH - PE  01/2013 - on Coumadin until 02/2018 OSA was very mild in 2015 and she lost significant weight and came off CPAP. CKD -4 (coladonato) Essential tremors  She reports that breathing has been worse compared to her last phone visit since January. She has received both Covid vaccinations and starting to venture out some but she still preferred this to be a phone visit. Occasional wheezing but denies cough or sputum production. Mild pedal edema, not on a diuretic, follows up with nephrologist  She deferred her colonoscopy since she was afraid of worsening kidney disease with the prep  She has now been quit from smoking for 25 years BUN/creatinine 6/1 24/2.0  Significant tests/ events  PSG at [facility] 08/2011 -217 lbs -AHI 4/h, TST 319 mins,supine   PSG 07/2013 -208 lbs -showed mild OSA TST - 390 mins, AHI 6/h, RDI of 8/h The lowest desaturation was 87%    Spirometry 04/2013: FEV1 72% predicted FEV1 FVC ratio 73 Spirometry4/2019severe airway obstruction with ratio 60, FEV1 of 41% and FVC of 52%  Echo 08/2014 grade 2 DD  12/2014 ONO - no desatn >> dc O2    Review of Systems Patient denies significant dyspnea,cough, hemoptysis,  chest pain, palpitations, pedal edema, orthopnea, paroxysmal nocturnal dyspnea, lightheadedness, nausea, vomiting, abdominal or  leg pains      Objective:   Physical Exam  Unable to. Weight unchanged  within 1 to 2 pounds   Total encounter time was 21 minutes      Assessment & Plan:

## 2020-01-28 NOTE — Patient Instructions (Addendum)
°  Rx for Breztri instead of Symbicort , 2 puffs twice daily, rinse mouth after use. Refills on albuterol

## 2020-01-28 NOTE — Telephone Encounter (Signed)
Checked CMM and saw that the PA was approved for [employer].  Called and spoke with pt letting her know this had been done and she verbalized understanding. Nothing further needed.

## 2020-02-03 ENCOUNTER — Other Ambulatory Visit: Payer: Self-pay | Admitting: Family Medicine

## 2020-02-04 DIAGNOSIS — N179 Acute kidney failure, unspecified: Secondary | ICD-10-CM | POA: Diagnosis not present

## 2020-02-04 DIAGNOSIS — R251 Tremor, unspecified: Secondary | ICD-10-CM | POA: Diagnosis not present

## 2020-02-04 DIAGNOSIS — I509 Heart failure, unspecified: Secondary | ICD-10-CM | POA: Diagnosis not present

## 2020-02-04 DIAGNOSIS — E785 Hyperlipidemia, unspecified: Secondary | ICD-10-CM | POA: Diagnosis not present

## 2020-02-04 DIAGNOSIS — D631 Anemia in chronic kidney disease: Secondary | ICD-10-CM | POA: Diagnosis not present

## 2020-02-04 DIAGNOSIS — N2581 Secondary hyperparathyroidism of renal origin: Secondary | ICD-10-CM | POA: Diagnosis not present

## 2020-02-04 DIAGNOSIS — N184 Chronic kidney disease, stage 4 (severe): Secondary | ICD-10-CM | POA: Diagnosis not present

## 2020-02-04 DIAGNOSIS — I129 Hypertensive chronic kidney disease with stage 1 through stage 4 chronic kidney disease, or unspecified chronic kidney disease: Secondary | ICD-10-CM | POA: Diagnosis not present

## 2020-02-04 DIAGNOSIS — E669 Obesity, unspecified: Secondary | ICD-10-CM | POA: Diagnosis not present

## 2020-02-11 DIAGNOSIS — H35342 Macular cyst, hole, or pseudohole, left eye: Secondary | ICD-10-CM | POA: Diagnosis not present

## 2020-02-11 DIAGNOSIS — H43822 Vitreomacular adhesion, left eye: Secondary | ICD-10-CM | POA: Diagnosis not present

## 2020-02-11 DIAGNOSIS — H353211 Exudative age-related macular degeneration, right eye, with active choroidal neovascularization: Secondary | ICD-10-CM | POA: Diagnosis not present

## 2020-02-11 DIAGNOSIS — H353122 Nonexudative age-related macular degeneration, left eye, intermediate dry stage: Secondary | ICD-10-CM | POA: Diagnosis not present

## 2020-02-11 DIAGNOSIS — Z961 Presence of intraocular lens: Secondary | ICD-10-CM | POA: Diagnosis not present

## 2020-02-16 ENCOUNTER — Other Ambulatory Visit: Payer: Self-pay | Admitting: Family Medicine

## 2020-02-19 NOTE — Assessment & Plan Note (Signed)
Unable and unwilling to take statins due to previous bad reactions

## 2020-02-23 ENCOUNTER — Other Ambulatory Visit: Payer: Self-pay

## 2020-02-23 ENCOUNTER — Encounter: Payer: Self-pay | Admitting: Cardiology

## 2020-02-23 ENCOUNTER — Ambulatory Visit: Payer: Medicare HMO | Admitting: Cardiology

## 2020-02-23 VITALS — BP 148/70 | HR 56 | Ht 63.0 in | Wt 185.0 lb

## 2020-02-23 DIAGNOSIS — I10 Essential (primary) hypertension: Secondary | ICD-10-CM | POA: Diagnosis not present

## 2020-02-23 DIAGNOSIS — E559 Vitamin D deficiency, unspecified: Secondary | ICD-10-CM

## 2020-02-23 DIAGNOSIS — I2510 Atherosclerotic heart disease of native coronary artery without angina pectoris: Secondary | ICD-10-CM

## 2020-02-23 DIAGNOSIS — R0989 Other specified symptoms and signs involving the circulatory and respiratory systems: Secondary | ICD-10-CM | POA: Diagnosis not present

## 2020-02-23 DIAGNOSIS — E782 Mixed hyperlipidemia: Secondary | ICD-10-CM | POA: Diagnosis not present

## 2020-02-23 HISTORY — DX: Other specified symptoms and signs involving the circulatory and respiratory systems: R09.89

## 2020-02-23 NOTE — Progress Notes (Signed)
Cardiology Office Note:    Date:  02/23/2020   ID:  Emily Mitchell, DOB 12-Nov-1945, MRN [id number]  PCP:  Emily Lukes, MD  Cardiologist:  Emily Lindau, MD   Referring MD: Emily Lukes, MD    ASSESSMENT:    1. Coronary artery disease involving native coronary artery of native heart without angina pectoris   2. Essential hypertension   3. Hyperlipidemia, mixed    PLAN:    In order of problems listed above:  1. Coronary artery disease: Secondary prevention stressed with the patient.  Importance of compliance with diet medication stressed and she vocalized understanding.  Importance of regular exercise stressed. 2. Essential hypertension: Blood pressure stable and diet was emphasized 3. Mixed dyslipidemia: Diet was emphasized.  Lipid numbers were reviewed from [facility] sheet and I reassured her about my findings.  She does not want to try statins because of significant issues in the past and is on fenofibrate therapy.  I respect her wishes. Carotid bruit.  Patient has had a right carotid endarterectomy and has a bruit on the left and we will do a bilateral carotid evaluation. Patient will be seen in follow-up appointment in 6 months or earlier if the patient has any concerns   Medication Adjustments/Labs and Tests Ordered: Current medicines are reviewed at length with the patient today.  Concerns regarding medicines are outlined above.  No orders of the defined types were placed in this encounter.  No orders of the defined types were placed in this encounter.    Chief Complaint  Patient presents with  . Follow-up     History of Present Illness:    Emily Mitchell is a 74 y.o. female.  Patient has past medical history of coronary artery disease and carotid endarterectomy on the right side.  She denies any problems at this time and takes care of activities of daily living.  No chest pain orthopnea or PND.  She mentions to me of having statin intolerance.  At the time of my  evaluation, the patient is alert awake oriented and in no distress.  Overall she leads a sedentary lifestyle.  Past Medical History:  Diagnosis Date  . Anemia 07/05/2014  . Anxiety and depression 12/14/2008   Qualifier: Diagnosis of  By: [name] LPN, Almyra Free    . Arthritis    "fingers, toes, knees, joints" (07/18/2018)  . Asthma   . Atypical chest pain 05/23/2015  . CAD (coronary artery disease)    a. [place] with cath 06/2018 s/p DES to LAD.  Marland Kitchen Carotid artery occlusion    a. R carotid occluded, 78-29% LICA.  Marland Kitchen CHF (congestive heart failure) ([place])   . Chicken pox as a child  . Chronic kidney disease    Bright's Disease at age 23   . COPD (chronic obstructive pulmonary disease) ([place])   . GERD (gastroesophageal reflux disease)   . H. pylori infection 10/19/2013  . Hepatitis 1970s   "don't know for sure which one it was; think it was A" (07/18/2018)  . Hyperglycemia 03/14/2016  . Hyperlipidemia   . Hypertension   . Hypothyroidism   . Knee pain, bilateral 12/17/2012  . Left hip pain 05/23/2015  . Low back pain 09/19/2015  . Macular degeneration of both eyes 12/16/2015   Right eye is wet Left Eye is dry Shots every 11 to 12 weeks in right eye at opthamologist office  . Medicare annual wellness visit, subsequent 02/21/2015  . Mumps 74 yrs old  . Pneumonia    "  at least once" (07/18/2018)  . Preventative health care 03/14/2016  . Pulmonary emboli ([place]) 05/26/2013  . Sleep apnea    "don't use CPAP anymore; dr said I didn't have to" (07/18/2018)  . Stroke [facility])    "been told I've had some strokes; didn't know I'd had them" (07/18/2018)  . Tremor   . UTI (lower urinary tract infection) 10/19/2013    Past Surgical History:  Procedure Laterality Date  . APPENDECTOMY  1984  . CARDIAC CATHETERIZATION  ~ 2006  . CAROTID ANGIOGRAPHY Left 08/20/2018   Procedure: CAROTID ANGIOGRAPHY;  Surgeon: Serafina Mitchell, MD;  Location: [facility];  Service: Cardiovascular;  Laterality: Left;  . CAROTID  ENDARTERECTOMY Right 05/10/07   cea  . CATARACT EXTRACTION Left   . CATARACT EXTRACTION W/ INTRAOCULAR LENS  IMPLANT, BILATERAL Bilateral   . CORONARY ANGIOPLASTY WITH STENT PLACEMENT  07/18/2018  . CORONARY STENT INTERVENTION N/A 07/18/2018   Procedure: CORONARY STENT INTERVENTION;  Surgeon: Lorretta Harp, MD;  Location: [facility];  Service: Cardiovascular;  Laterality: N/A;  . INTRAVASCULAR PRESSURE WIRE/FFR STUDY N/A 07/18/2018   Procedure: INTRAVASCULAR PRESSURE WIRE/FFR STUDY;  Surgeon: Lorretta Harp, MD;  Location: [facility];  Service: Cardiovascular;  Laterality: N/A;  . JOINT REPLACEMENT    . LEFT HEART CATH AND CORONARY ANGIOGRAPHY N/A 07/18/2018   Procedure: LEFT HEART CATH AND CORONARY ANGIOGRAPHY;  Surgeon: Lorretta Harp, MD;  Location: [place] [facility];  Service: Cardiovascular;  Laterality: N/A;  . MULTIPLE TOOTH EXTRACTIONS    . TOTAL KNEE ARTHROPLASTY Bilateral 2005 - 2011   right - left  . VAGINAL HYSTERECTOMY  1984  . WISDOM TOOTH EXTRACTION      Current Medications: Current Meds  Medication Sig  . acetaminophen (TYLENOL) 500 MG tablet Take by mouth.  Marland Kitchen albuterol (VENTOLIN HFA) 108 (90 Base) MCG/ACT inhaler Inhale 2 puffs into the lungs every 6 (six) hours as needed for wheezing or shortness of breath.  Marland Kitchen aMILoride (MIDAMOR) 5 MG tablet Take by mouth.  Marland Kitchen amLODipine (NORVASC) 2.5 MG tablet Take by mouth.  Marland Kitchen aspirin EC 81 MG tablet Take 1 tablet (81 mg total) by mouth daily.  . Bevacizumab (AVASTIN) 100 MG/4ML SOLN   . Budeson-Glycopyrrol-Formoterol (BREZTRI AEROSPHERE) 160-9-4.8 MCG/ACT AERO Inhale 2 puffs into the lungs 2 (two) times daily.  . Calcium Citrate-Vitamin D (CALCIUM + D PO) Take 1 tablet by mouth daily.  . calcium citrate-vitamin D (CITRACAL+D) 315-200 MG-UNIT tablet Take by mouth.  . carvedilol (COREG) 6.25 MG tablet TAKE ONE TABLET BY MOUTH TWICE A DAY WITH MEALS  . Cholecalciferol (D3 MAXIMUM STRENGTH) 5000 UNITS capsule  Take 5,000 Units by mouth daily.  . Choline Fenofibrate (FENOFIBRIC ACID) 135 MG CPDR TAKE ONE CAPSULE BY MOUTH DAILY  . clonazePAM (KLONOPIN) 1 MG tablet TAKE ONE TABLET BY MOUTH THREE TIMES A DAY AS NEEDED FOR ANXIETY  . clopidogrel (PLAVIX) 75 MG tablet TAKE ONE TABLET BY MOUTH DAILY  . ezetimibe (ZETIA) 10 MG tablet TAKE ONE TABLET BY MOUTH DAILY  . famotidine (PEPCID) 40 MG tablet Take 1 tablet (40 mg total) by mouth daily.  Marland Kitchen FLUoxetine (PROZAC) 20 MG tablet Take 1 tablet (20 mg total) by mouth daily.  Marland Kitchen gabapentin (NEURONTIN) 100 MG capsule TAKE TWO CAPSULES BY MOUTH EVERY NIGHT AT BEDTIME  . Lactobacillus (DIGESTIVE HEALTH PROBIOTIC PO) Take 1 capsule by mouth daily.  Marland Kitchen levothyroxine (SYNTHROID) 112 MCG tablet Take 1 tablet (112 mcg total) by  mouth daily.  . montelukast (SINGULAIR) 10 MG tablet Take 1 tablet (10 mg total) by mouth at bedtime as needed.  . Multiple Vitamins-Minerals (PRESERVISION AREDS PO) Take 1 capsule by mouth daily.  . Omega-3 Fatty Acids (FISH OIL PO) Take 1 capsule by mouth daily.  . vitamin B-12 (CYANOCOBALAMIN) 500 MCG tablet Take 500 mcg by mouth daily.      Allergies:   Niaspan [niacin er], Pantoprazole, Bupropion, Nitroglycerin, Penicillins, Statins, Sulfonamide derivatives, Apple, and Banana   Social History   Socioeconomic History  . Marital status: Divorced    Spouse name: Not on file  . Number of children: 2  . Years of education: Not on file  . Highest education level: Not on file  Occupational History  . Not on file  Tobacco Use  . Smoking status: Former Smoker    Packs/day: 2.00    Years: 30.00    Pack years: 60.00    Types: Cigarettes    Quit date: 12/03/1994    Years since quitting: 25.2  . Smokeless tobacco: Never Used  Vaping Use  . Vaping Use: Never used  Substance and Sexual Activity  . Alcohol use: Yes    Comment: 07/18/2018 "1 drink q 1-2 months; if that"  . Drug use: Never  . Sexual activity: Not Currently    Comment: lives  with son and friend, no dietary restrictions  Other Topics Concern  . Not on file  Social History Narrative   Right handed   No caffeine   One story home   Social Determinants of Health   Financial Resource Strain:   . Difficulty of Paying Living Expenses:   Food Insecurity:   . Worried About Charity fundraiser in the Last Year:   . Arboriculturist in the Last Year:   Transportation Needs:   . Film/video editor (Medical):   Marland Kitchen Lack of Transportation (Non-Medical):   Physical Activity:   . Days of Exercise per Week:   . Minutes of Exercise per Session:   Stress:   . Feeling of Stress :   Social Connections:   . Frequency of Communication with Friends and Family:   . Frequency of Social Gatherings with Friends and Family:   . Attends Religious Services:   . Active Member of Clubs or Organizations:   . Attends Archivist Meetings:   Marland Kitchen Marital Status:      Family History: The patient's family history includes Allergic rhinitis in her sister; Diabetes in her sister; Heart attack in her maternal grandfather; Heart failure in her mother; Hyperlipidemia in her sister; Hypertension in her mother; Kidney disease in her mother and sister; Stroke in her mother. There is no history of Colon cancer.  ROS:   Please see the history of present illness.    All other systems reviewed and are negative.  EKGs/Labs/Other Studies Reviewed:    The following studies were reviewed today: I discussed my findings with the patient at length.   Recent Labs: 03/15/2019: Magnesium 2.1 12/30/2019: ALT 16; BUN 24; Creatinine, Ser 2.05; Hemoglobin 12.7; Platelets 239.0; Potassium 4.7; Sodium 141; TSH 0.12  Recent Lipid Panel    Component Value Date/Time   CHOL 165 12/30/2019 1438   TRIG 156.0 (H) 12/30/2019 1438   HDL 47.00 12/30/2019 1438   CHOLHDL 4 12/30/2019 1438   VLDL 31.2 12/30/2019 1438   LDLCALC 86 12/30/2019 1438   LDLDIRECT 103.0 07/14/2019 1323    Physical Exam:  VS:  BP (!) 148/70 (BP Location: Right Arm, Patient Position: Sitting, Cuff Size: Large)   Pulse 56   Ht 5\' 3"  (1.6 m)   Wt 185 lb (83.9 kg)   SpO2 97%   BMI 32.77 kg/m     Wt Readings from Last 3 Encounters:  02/23/20 185 lb (83.9 kg)  01/02/20 188 lb (85.3 kg)  12/30/19 188 lb 3.2 oz (85.4 kg)     GEN: Patient is in no acute distress HEENT: Normal NECK: No JVD; No carotid bruits LYMPHATICS: No lymphadenopathy CARDIAC: Hear sounds regular, 2/6 systolic murmur at the apex. RESPIRATORY:  Clear to auscultation without rales, wheezing or rhonchi  ABDOMEN: Soft, non-tender, non-distended MUSCULOSKELETAL:  No edema; No deformity  SKIN: Warm and dry NEUROLOGIC:  Alert and oriented x 3 PSYCHIATRIC:  Normal affect   Signed, Emily Lindau, MD  02/23/2020 2:49 PM    [employer]

## 2020-02-23 NOTE — Patient Instructions (Addendum)
Medication Instructions:  Your physician recommends that you continue on your current medications as directed. Please refer to the Current Medication list given to you today.  *If you need a refill on your cardiac medications before your next appointment, please call your pharmacy*   Lab Work: None ordered  If you have labs (blood work) drawn today and your tests are completely normal, you will receive your results only by: Marland Kitchen MyChart Message (if you have MyChart) OR . A paper copy in the mail If you have any lab test that is abnormal or we need to change your treatment, we will call you to review the results.   Testing/Procedures: Your physician has requested that you have a carotid duplex. This test is an ultrasound of the carotid arteries in your neck. It looks at blood flow through these arteries that supply the brain with blood. Allow one hour for this exam. There are no restrictions or special instructions.     Follow-Up: At [facility], you and your health needs are our priority.  As part of our continuing mission to provide you with exceptional heart care, we have created designated Provider Care Teams.  These Care Teams include your primary Cardiologist (physician) and Advanced Practice Providers (APPs -  Physician Assistants and Nurse Practitioners) who all work together to provide you with the care you need, when you need it.  We recommend signing up for the patient portal called "MyChart".  Sign up information is provided on this After Visit Summary.  MyChart is used to connect with patients for Virtual Visits (Telemedicine).  Patients are able to view lab/test results, encounter notes, upcoming appointments, etc.  Non-urgent messages can be sent to your provider as well.   To learn more about what you can do with MyChart, go to [email or web address].    Your next appointment:   6 month(s)  The format for your next appointment:   In Person  Provider:   Jyl Heinz, MD   Other Instructions

## 2020-02-25 ENCOUNTER — Telehealth: Payer: Self-pay | Admitting: Family Medicine

## 2020-02-25 MED ORDER — LEVOTHYROXINE SODIUM 112 MCG PO TABS: 112.0000 ug | ORAL_TABLET | Freq: Every day | ORAL | 3 refills | Status: DC

## 2020-02-25 NOTE — Telephone Encounter (Signed)
Rx sent 

## 2020-02-25 NOTE — Telephone Encounter (Signed)
New message:   Pt is calling and states [name] at [place], [place] is needing a new prescription for the new dose of the medication for levothyroxine (SYNTHROID) 112 MCG tablet. She states the last one they filled was for 125 mcg. Please advise.

## 2020-02-27 ENCOUNTER — Telehealth: Payer: Self-pay | Admitting: Emergency Medicine

## 2020-02-27 ENCOUNTER — Ambulatory Visit (HOSPITAL_COMMUNITY)
Admission: RE | Admit: 2020-02-27 | Discharge: 2020-02-27 | Disposition: A | Discharge status: Home / Self Care | Payer: Medicare HMO | Source: Ambulatory Visit | Attending: Cardiology | Admitting: Cardiology

## 2020-02-27 ENCOUNTER — Other Ambulatory Visit: Payer: Self-pay

## 2020-02-27 DIAGNOSIS — Z79899 Other long term (current) drug therapy: Secondary | ICD-10-CM

## 2020-02-27 DIAGNOSIS — E782 Mixed hyperlipidemia: Secondary | ICD-10-CM

## 2020-02-27 DIAGNOSIS — R0989 Other specified symptoms and signs involving the circulatory and respiratory systems: Secondary | ICD-10-CM | POA: Diagnosis not present

## 2020-02-27 NOTE — Telephone Encounter (Signed)
Tell her to check on the weekend just what exactly the allergy means and get back to [place] next week.

## 2020-02-27 NOTE — Telephone Encounter (Signed)
-----   Message from Jenean Lindau, MD sent at 02/27/2020  3:46 PM EDT ----- Patient has mild atherosclerosis of the carotids.  Patient needs to be on statin.  I reviewed lipids from recent.  Atorvastatin 10 mg and liver lipid check in 6 weeks.  Diet.  Copy primary care Jenean Lindau, MD 02/27/2020 3:45 PM

## 2020-02-27 NOTE — Telephone Encounter (Signed)
Called patient and informed her of results. Advised her to start Lipitor. However she has it listed as a allergy but she can't remember her reaction. Will consult with Dr. Geraldo Pitter to see if he still wants to start her on this.

## 2020-03-01 MED ORDER — ATORVASTATIN CALCIUM 10 MG PO TABS: 10.0000 mg | ORAL_TABLET | Freq: Every day | ORAL | 3 refills | Status: DC

## 2020-03-01 NOTE — Addendum Note (Signed)
Addended by: Truddie Hidden on: 03/01/2020 10:06 AM   Modules accepted: Orders

## 2020-03-01 NOTE — Telephone Encounter (Signed)
Pt states that she does not remember her reaction to the statin. She has never had a reaction to any medication that was shortness of breath or anaphylaxis. Pt states that she will try the Lipitor as requested. A 30 day RX was sent in to the pharmacy. Pt advised to contact the office for any concerns or questions. Pt verbalized understanding.

## 2020-03-10 DIAGNOSIS — H43823 Vitreomacular adhesion, bilateral: Secondary | ICD-10-CM | POA: Diagnosis not present

## 2020-03-10 DIAGNOSIS — H353211 Exudative age-related macular degeneration, right eye, with active choroidal neovascularization: Secondary | ICD-10-CM | POA: Diagnosis not present

## 2020-03-10 DIAGNOSIS — Z961 Presence of intraocular lens: Secondary | ICD-10-CM | POA: Diagnosis not present

## 2020-03-10 DIAGNOSIS — H353122 Nonexudative age-related macular degeneration, left eye, intermediate dry stage: Secondary | ICD-10-CM | POA: Diagnosis not present

## 2020-03-10 DIAGNOSIS — H35342 Macular cyst, hole, or pseudohole, left eye: Secondary | ICD-10-CM | POA: Diagnosis not present

## 2020-03-10 DIAGNOSIS — H35363 Drusen (degenerative) of macula, bilateral: Secondary | ICD-10-CM | POA: Diagnosis not present

## 2020-03-16 DIAGNOSIS — N189 Chronic kidney disease, unspecified: Secondary | ICD-10-CM | POA: Diagnosis not present

## 2020-03-16 DIAGNOSIS — N1832 Chronic kidney disease, stage 3b: Secondary | ICD-10-CM | POA: Diagnosis not present

## 2020-03-31 NOTE — Addendum Note (Signed)
Addended by: Kelle Darting A on: 03/31/2020 02:08 PM   Modules accepted: Orders

## 2020-04-01 ENCOUNTER — Other Ambulatory Visit: Payer: Medicare HMO

## 2020-04-02 ENCOUNTER — Other Ambulatory Visit: Payer: Self-pay

## 2020-04-02 ENCOUNTER — Other Ambulatory Visit: Payer: Medicare HMO

## 2020-04-02 DIAGNOSIS — E038 Other specified hypothyroidism: Secondary | ICD-10-CM | POA: Diagnosis not present

## 2020-04-03 LAB — TSH: TSH: 1.78 mIU/L (ref 0.40–4.50)

## 2020-04-12 ENCOUNTER — Other Ambulatory Visit: Payer: Self-pay | Admitting: *Deleted

## 2020-04-12 ENCOUNTER — Other Ambulatory Visit (HOSPITAL_BASED_OUTPATIENT_CLINIC_OR_DEPARTMENT_OTHER): Payer: Self-pay | Admitting: Family Medicine

## 2020-04-12 ENCOUNTER — Telehealth: Payer: Self-pay | Admitting: Family Medicine

## 2020-04-12 ENCOUNTER — Other Ambulatory Visit: Payer: Self-pay | Admitting: Family Medicine

## 2020-04-12 DIAGNOSIS — E782 Mixed hyperlipidemia: Secondary | ICD-10-CM | POA: Diagnosis not present

## 2020-04-12 DIAGNOSIS — R928 Other abnormal and inconclusive findings on diagnostic imaging of breast: Secondary | ICD-10-CM

## 2020-04-12 DIAGNOSIS — Z79899 Other long term (current) drug therapy: Secondary | ICD-10-CM | POA: Diagnosis not present

## 2020-04-12 DIAGNOSIS — Z1231 Encounter for screening mammogram for malignant neoplasm of breast: Secondary | ICD-10-CM

## 2020-04-12 NOTE — Telephone Encounter (Signed)
error 

## 2020-04-13 LAB — HEPATIC FUNCTION PANEL
ALT: 14 IU/L (ref 0–32)
AST: 21 IU/L (ref 0–40)
Albumin: 4.3 g/dL (ref 3.7–4.7)
Alkaline Phosphatase: 58 IU/L (ref 44–121)
Bilirubin Total: 0.4 mg/dL (ref 0.0–1.2)
Bilirubin, Direct: 0.22 mg/dL (ref 0.00–0.40)
Total Protein: 6.5 g/dL (ref 6.0–8.5)

## 2020-04-13 LAB — LIPID PANEL
Chol/HDL Ratio: 3.3 ratio (ref 0.0–4.4)
Cholesterol, Total: 143 mg/dL (ref 100–199)
HDL: 44 mg/dL (ref >39)
LDL Chol Calc (NIH): 71 mg/dL (ref 0–99)
Triglycerides: 162 mg/dL — ABNORMAL HIGH (ref 0–149)
VLDL Cholesterol Cal: 28 mg/dL (ref 5–40)

## 2020-04-13 NOTE — Addendum Note (Signed)
Addended by: Truddie Hidden on: 04/13/2020 11:45 AM   Modules accepted: Orders

## 2020-04-14 ENCOUNTER — Ambulatory Visit (INDEPENDENT_AMBULATORY_CARE_PROVIDER_SITE_OTHER): Payer: Medicare HMO | Admitting: *Deleted

## 2020-04-14 ENCOUNTER — Other Ambulatory Visit: Payer: Self-pay

## 2020-04-14 DIAGNOSIS — Z23 Encounter for immunization: Secondary | ICD-10-CM

## 2020-04-14 NOTE — Progress Notes (Signed)
Patient here for high dose flu shot.  Vaccine given in left deltoid and patient tolerated well.

## 2020-04-21 DIAGNOSIS — H353211 Exudative age-related macular degeneration, right eye, with active choroidal neovascularization: Secondary | ICD-10-CM | POA: Diagnosis not present

## 2020-04-21 DIAGNOSIS — H43822 Vitreomacular adhesion, left eye: Secondary | ICD-10-CM | POA: Diagnosis not present

## 2020-04-21 DIAGNOSIS — H353122 Nonexudative age-related macular degeneration, left eye, intermediate dry stage: Secondary | ICD-10-CM | POA: Diagnosis not present

## 2020-04-21 DIAGNOSIS — H35342 Macular cyst, hole, or pseudohole, left eye: Secondary | ICD-10-CM | POA: Diagnosis not present

## 2020-04-21 DIAGNOSIS — Z961 Presence of intraocular lens: Secondary | ICD-10-CM | POA: Diagnosis not present

## 2020-04-30 ENCOUNTER — Other Ambulatory Visit: Payer: Self-pay | Admitting: Family Medicine

## 2020-04-30 DIAGNOSIS — E785 Hyperlipidemia, unspecified: Secondary | ICD-10-CM

## 2020-05-01 NOTE — Telephone Encounter (Signed)
Requesting: klonpnin  Contract:01/08/20 UDS:12/30/19 Last Visit:12/30/19 Next Visit:06/01/20 Last Refill:02/03/20  Please Advise

## 2020-05-04 ENCOUNTER — Other Ambulatory Visit: Payer: Self-pay | Admitting: Family Medicine

## 2020-05-07 ENCOUNTER — Other Ambulatory Visit: Payer: Self-pay | Admitting: Pulmonary Disease

## 2020-05-08 ENCOUNTER — Other Ambulatory Visit: Payer: Self-pay | Admitting: Family Medicine

## 2020-05-10 DIAGNOSIS — N1832 Chronic kidney disease, stage 3b: Secondary | ICD-10-CM | POA: Diagnosis not present

## 2020-05-13 DIAGNOSIS — R69 Illness, unspecified: Secondary | ICD-10-CM | POA: Diagnosis not present

## 2020-05-13 DIAGNOSIS — E039 Hypothyroidism, unspecified: Secondary | ICD-10-CM | POA: Diagnosis not present

## 2020-05-13 DIAGNOSIS — I25119 Atherosclerotic heart disease of native coronary artery with unspecified angina pectoris: Secondary | ICD-10-CM | POA: Diagnosis not present

## 2020-05-13 DIAGNOSIS — I429 Cardiomyopathy, unspecified: Secondary | ICD-10-CM | POA: Diagnosis not present

## 2020-05-13 DIAGNOSIS — I110 Hypertensive heart disease with heart failure: Secondary | ICD-10-CM | POA: Diagnosis not present

## 2020-05-13 DIAGNOSIS — E785 Hyperlipidemia, unspecified: Secondary | ICD-10-CM | POA: Diagnosis not present

## 2020-05-13 DIAGNOSIS — I739 Peripheral vascular disease, unspecified: Secondary | ICD-10-CM | POA: Diagnosis not present

## 2020-05-13 DIAGNOSIS — J449 Chronic obstructive pulmonary disease, unspecified: Secondary | ICD-10-CM | POA: Diagnosis not present

## 2020-05-13 DIAGNOSIS — I509 Heart failure, unspecified: Secondary | ICD-10-CM | POA: Diagnosis not present

## 2020-05-17 DIAGNOSIS — N6489 Other specified disorders of breast: Secondary | ICD-10-CM | POA: Diagnosis not present

## 2020-05-17 DIAGNOSIS — R922 Inconclusive mammogram: Secondary | ICD-10-CM | POA: Diagnosis not present

## 2020-05-17 DIAGNOSIS — R928 Other abnormal and inconclusive findings on diagnostic imaging of breast: Secondary | ICD-10-CM | POA: Diagnosis not present

## 2020-05-17 DIAGNOSIS — N649 Disorder of breast, unspecified: Secondary | ICD-10-CM | POA: Diagnosis not present

## 2020-05-21 ENCOUNTER — Other Ambulatory Visit: Payer: Self-pay | Admitting: Cardiology

## 2020-06-01 ENCOUNTER — Ambulatory Visit (INDEPENDENT_AMBULATORY_CARE_PROVIDER_SITE_OTHER): Payer: Medicare HMO | Admitting: Family Medicine

## 2020-06-01 ENCOUNTER — Other Ambulatory Visit: Payer: Self-pay

## 2020-06-01 DIAGNOSIS — H3530 Unspecified macular degeneration: Secondary | ICD-10-CM | POA: Diagnosis not present

## 2020-06-01 DIAGNOSIS — R739 Hyperglycemia, unspecified: Secondary | ICD-10-CM

## 2020-06-01 DIAGNOSIS — E782 Mixed hyperlipidemia: Secondary | ICD-10-CM

## 2020-06-01 DIAGNOSIS — N184 Chronic kidney disease, stage 4 (severe): Secondary | ICD-10-CM

## 2020-06-01 DIAGNOSIS — E038 Other specified hypothyroidism: Secondary | ICD-10-CM | POA: Diagnosis not present

## 2020-06-01 DIAGNOSIS — I10 Essential (primary) hypertension: Secondary | ICD-10-CM

## 2020-06-01 DIAGNOSIS — K219 Gastro-esophageal reflux disease without esophagitis: Secondary | ICD-10-CM | POA: Diagnosis not present

## 2020-06-01 DIAGNOSIS — N6320 Unspecified lump in the left breast, unspecified quadrant: Secondary | ICD-10-CM

## 2020-06-01 DIAGNOSIS — E559 Vitamin D deficiency, unspecified: Secondary | ICD-10-CM

## 2020-06-01 DIAGNOSIS — I509 Heart failure, unspecified: Secondary | ICD-10-CM

## 2020-06-01 MED ORDER — FAMOTIDINE 40 MG PO TABS
40.0000 mg | ORAL_TABLET | Freq: Every day | ORAL | 1 refills | Status: DC
Start: 2020-06-01 — End: 2020-11-05

## 2020-06-01 MED ORDER — LEVOTHYROXINE SODIUM 112 MCG PO TABS
112.0000 ug | ORAL_TABLET | Freq: Every day | ORAL | 1 refills | Status: DC
Start: 2020-06-01 — End: 2020-10-22

## 2020-06-01 NOTE — Patient Instructions (Addendum)
Check with [employer] about cologuard results   Hypothyroidism  Hypothyroidism is when the thyroid gland does not make enough of certain hormones (it is underactive). The thyroid gland is a small gland located in the lower front part of the neck, just in front of the windpipe (trachea). This gland makes hormones that help control how the body uses food for energy (metabolism) as well as how the heart and brain function. These hormones also play a role in keeping your bones strong. When the thyroid is underactive, it produces too little of the hormones thyroxine (T4) and triiodothyronine (T3). What are the causes? This condition may be caused by:  Hashimoto's disease. This is a disease in which the body's disease-fighting system (immune system) attacks the thyroid gland. This is the most common cause.  Viral infections.  Pregnancy.  Certain medicines.  Birth defects.  Past radiation treatments to the head or neck for cancer.  Past treatment with radioactive iodine.  Past exposure to radiation in the environment.  Past surgical removal of part or all of the thyroid.  Problems with a gland in the center of the brain (pituitary gland).  Lack of enough iodine in the diet. What increases the risk? You are more likely to develop this condition if:  You are female.  You have a family history of thyroid conditions.  You use a medicine called lithium.  You take medicines that affect the immune system (immunosuppressants). What are the signs or symptoms? Symptoms of this condition include:  Feeling as though you have no energy (lethargy).  Not being able to tolerate cold.  Weight gain that is not explained by a change in diet or exercise habits.  Lack of appetite.  Dry skin.  Coarse hair.  Menstrual irregularity.  Slowing of thought processes.  Constipation.  Sadness or depression. How is this diagnosed? This condition may be diagnosed based on:  Your symptoms, your  medical history, and a physical exam.  Blood tests. You may also have imaging tests, such as an ultrasound or MRI. How is this treated? This condition is treated with medicine that replaces the thyroid hormones that your body does not make. After you begin treatment, it may take several weeks for symptoms to go away. Follow these instructions at home:  Take over-the-counter and prescription medicines only as told by your health care provider.  If you start taking any new medicines, tell your health care provider.  Keep all follow-up visits as told by your health care provider. This is important. ? As your condition improves, your dosage of thyroid hormone medicine may change. ? You will need to have blood tests regularly so that your health care provider can monitor your condition. Contact a health care provider if:  Your symptoms do not get better with treatment.  You are taking thyroid replacement medicine and you: ? Sweat a lot. ? Have tremors. ? Feel anxious. ? Lose weight rapidly. ? Cannot tolerate heat. ? Have emotional swings. ? Have diarrhea. ? Feel weak. Get help right away if you have:  Chest pain.  An irregular heartbeat.  A rapid heartbeat.  Difficulty breathing. Summary  Hypothyroidism is when the thyroid gland does not make enough of certain hormones (it is underactive).  When the thyroid is underactive, it produces too little of the hormones thyroxine (T4) and triiodothyronine (T3).  The most common cause is Hashimoto's disease, a disease in which the body's disease-fighting system (immune system) attacks the thyroid gland. The condition can also be  caused by viral infections, medicine, pregnancy, or past radiation treatment to the head or neck.  Symptoms may include weight gain, dry skin, constipation, feeling as though you do not have energy, and not being able to tolerate cold.  This condition is treated with medicine to replace the thyroid hormones  that your body does not make. This information is not intended to replace advice given to you by your health care provider. Make sure you discuss any questions you have with your health care provider. Document Revised: 06/29/2017 Document Reviewed: 06/27/2017 Elsevier Patient Education  2020 [employer].

## 2020-06-02 NOTE — Progress Notes (Signed)
Subjective:    Patient ID: Emily Mitchell, female    DOB: 08-15-1945, 74 y.o.   MRN: [id number]  Chief Complaint  Patient presents with   Follow-up   Hyperlipidemia   Hypertension    HPI Patient is in today for follow up on chronic medical concerns. No recent febrile illness or hospitalizations. She had a fall a couple weeks ago in her bedroom at night. She has some persistent bruising but no bony pain or persistent injury. She is following with cardiology, pulmonology, opthamology and nephrology closely and stable at the present time. Denies CP/palp/SOB/HA/congestion/fevers/GI or GU c/o. Taking meds as prescribed  Past Medical History:  Diagnosis Date   Anemia 07/05/2014   Anxiety and depression 12/14/2008   Qualifier: Diagnosis of  By: [name] LPN, Almyra Free     Arthritis    "fingers, toes, knees, joints" (07/18/2018)   Asthma    Atypical chest pain 05/23/2015   CAD (coronary artery disease)    a. [place] with cath 06/2018 s/p DES to LAD.   Carotid artery occlusion    a. R carotid occluded, 11-94% LICA.   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 pylori infection 10/19/2013   Hepatitis 1970s   "don't know for sure which one it was; think it was A" (07/18/2018)   Hyperglycemia 03/14/2016   Hyperlipidemia    Hypertension    Hypothyroidism    Knee pain, bilateral 12/17/2012   Left hip pain 05/23/2015   Low back pain 09/19/2015   Macular degeneration of both eyes 12/16/2015   Right eye is wet Left Eye is dry Shots every 11 to 12 weeks in right eye at opthamologist office   Medicare annual wellness visit, subsequent 02/21/2015   Mumps 74 yrs old   Pneumonia    "at least once" (07/18/2018)   Preventative health care 03/14/2016   Pulmonary emboli ([place]) 05/26/2013   Sleep apnea     "don't use CPAP anymore; dr said I didn't have to" (07/18/2018)   Stroke [facility])    "been told I've had some strokes; didn't know I'd had them" (07/18/2018)   Tremor    UTI (lower urinary tract infection) 10/19/2013    Past Surgical History:  Procedure Laterality Date   APPENDECTOMY  1984   CARDIAC CATHETERIZATION  ~ 2006   CAROTID ANGIOGRAPHY Left 08/20/2018   Procedure: CAROTID ANGIOGRAPHY;  Surgeon: Serafina Mitchell, MD;  Location: [place] [facility];  Service: Cardiovascular;  Laterality: Left;   CAROTID ENDARTERECTOMY Right 05/10/07   cea   CATARACT EXTRACTION Left    CATARACT EXTRACTION W/ INTRAOCULAR LENS  IMPLANT, BILATERAL Bilateral    CORONARY ANGIOPLASTY WITH STENT PLACEMENT  07/18/2018   CORONARY STENT INTERVENTION N/A 07/18/2018   Procedure: CORONARY STENT INTERVENTION;  Surgeon: Lorretta Harp, MD;  Location: [place] [facility];  Service: Cardiovascular;  Laterality: N/A;   INTRAVASCULAR PRESSURE WIRE/FFR STUDY N/A 07/18/2018   Procedure: INTRAVASCULAR PRESSURE WIRE/FFR STUDY;  Surgeon: Lorretta Harp, MD;  Location: [place] [facility];  Service: Cardiovascular;  Laterality: N/A;   JOINT REPLACEMENT     LEFT HEART CATH AND CORONARY ANGIOGRAPHY N/A 07/18/2018   Procedure: LEFT HEART CATH AND CORONARY ANGIOGRAPHY;  Surgeon: Lorretta Harp, MD;  Location: [place] [facility];  Service: Cardiovascular;  Laterality: N/A;   MULTIPLE TOOTH EXTRACTIONS     TOTAL KNEE ARTHROPLASTY Bilateral 2005 - 2011   right - left   VAGINAL HYSTERECTOMY  1984   WISDOM TOOTH EXTRACTION      Family History  Problem Relation Age of Onset   Stroke Mother        mini stroke   Kidney disease Mother    Heart failure Mother    Hypertension Mother    Diabetes Sister        type 2   Kidney disease Sister    Allergic rhinitis Sister    Heart attack Maternal Grandfather    Hyperlipidemia Sister    Colon cancer Neg Hx     Social History   Socioeconomic  History   Marital status: Divorced    Spouse name: Not on file   Number of children: 2   Years of education: Not on file   Highest education level: Not on file  Occupational History   Not on file  Tobacco Use   Smoking status: Former Smoker    Packs/day: 2.00    Years: 30.00    Pack years: 60.00    Types: Cigarettes    Quit date: 12/03/1994    Years since quitting: 25.5   Smokeless tobacco: Never Used  Vaping Use   Vaping Use: Never used  Substance and Sexual Activity   Alcohol use: Yes    Comment: 07/18/2018 "1 drink q 1-2 months; if that"   Drug use: Never   Sexual activity: Not Currently    Comment: lives with son and friend, no dietary restrictions  Other Topics Concern   Not on file  Social History Narrative   Right handed   No caffeine   One story home   Social Determinants of Health   Financial Resource Strain:    Difficulty of Paying Living Expenses: Not on file  Food Insecurity:    Worried About Charity fundraiser in the Last Year: Not on file   [employer] of Food in the Last Year: Not on file  Transportation Needs:    Lack of Transportation (Medical): Not on file   Lack of Transportation (Non-Medical): Not on file  Physical Activity:    Days of Exercise per Week: Not on file   Minutes of Exercise per Session: Not on file  Stress:    Feeling of Stress : Not on file  Social Connections:    Frequency of Communication with Friends and Family: Not on file   Frequency of Social Gatherings with Friends and Family: Not on file   Attends Religious Services: Not on file   Active Member of Clubs or Organizations: Not on file   Attends Archivist Meetings: Not on file   Marital Status: Not on file  Intimate Partner Violence:    Fear of Current or Ex-Partner: Not on file   Emotionally Abused: Not on file   Physically Abused: Not on file   Sexually Abused: Not on file    Outpatient Medications Prior to Visit  Medication Sig  Dispense Refill   acetaminophen (TYLENOL) 500 MG tablet Take by mouth.     albuterol (VENTOLIN HFA) 108 (90 Base) MCG/ACT inhaler Inhale 2 puffs into the lungs every 6 (six) hours as needed for wheezing or shortness of breath. 18 g 3   aMILoride (MIDAMOR) 5 MG tablet Take by mouth.     amLODipine (NORVASC) 2.5 MG tablet Take by mouth.  aspirin EC 81 MG tablet Take 1 tablet (81 mg total) by mouth daily. 90 tablet 3   atorvastatin (LIPITOR) 10 MG tablet TAKE ONE TABLET BY MOUTH DAILY 90 tablet 2   Bevacizumab (AVASTIN) 100 MG/4ML SOLN      BREZTRI AEROSPHERE 160-9-4.8 MCG/ACT AERO INHALE TWO PUFFS BY MOUTH TWICE A DAY IN THE MORNING AND IN THE EVENING 32.1 g 1   Calcium Citrate-Vitamin D (CALCIUM + D PO) Take 1 tablet by mouth daily.     calcium citrate-vitamin D (CITRACAL+D) 315-200 MG-UNIT tablet Take by mouth.     carvedilol (COREG) 6.25 MG tablet TAKE ONE TABLET BY MOUTH TWICE A DAY WITH MEALS 180 tablet 0   Cholecalciferol (D3 MAXIMUM STRENGTH) 5000 UNITS capsule Take 5,000 Units by mouth daily.     Choline Fenofibrate (FENOFIBRIC ACID) 135 MG CPDR TAKE ONE CAPSULE BY MOUTH DAILY 90 capsule 0   clonazePAM (KLONOPIN) 1 MG tablet TAKE ONE TABLET BY MOUTH THREE TIMES A DAY AS NEEDED FOR ANXIETY 90 tablet 1   clopidogrel (PLAVIX) 75 MG tablet Take 1 tablet (75 mg total) by mouth daily. 90 tablet 3   ezetimibe (ZETIA) 10 MG tablet Take 1 tablet (10 mg total) by mouth daily. 90 tablet 1   FLUoxetine (PROZAC) 20 MG tablet TAKE ONE TABLET BY MOUTH DAILY 30 tablet 0   gabapentin (NEURONTIN) 100 MG capsule TAKE TWO CAPSULES BY MOUTH EVERY NIGHT AT BEDTIME 180 capsule 0   Lactobacillus (DIGESTIVE HEALTH PROBIOTIC PO) Take 1 capsule by mouth daily.     montelukast (SINGULAIR) 10 MG tablet Take 1 tablet (10 mg total) by mouth at bedtime as needed. 90 tablet 3   Multiple Vitamins-Minerals (PRESERVISION AREDS PO) Take 1 capsule by mouth daily.     Omega-3 Fatty Acids (FISH OIL PO)  Take 1 capsule by mouth daily.     vitamin B-12 (CYANOCOBALAMIN) 500 MCG tablet Take 500 mcg by mouth daily.      levothyroxine (SYNTHROID) 112 MCG tablet Take 1 tablet (112 mcg total) by mouth daily. 30 tablet 3   famotidine (PEPCID) 40 MG tablet Take 1 tablet (40 mg total) by mouth daily. 90 tablet 1   levothyroxine (SYNTHROID) 125 MCG tablet TAKE ONE TABLET BY MOUTH DAILY BEFORE BREAKFAST 90 tablet 0   No facility-administered medications prior to visit.    Allergies  Allergen Reactions   Niaspan [Niacin Er] Nausea And Vomiting and Swelling    Swelling in mouth   Pantoprazole     Mouth sores   Bupropion Other (See Comments)    Uncontrollable shakes   Nitroglycerin     NOT allergic - cardiology has recommended she NOT use this due to h/o severe carotid disease as it may decrease cerebral perfusion   Penicillins Hives    DID THE REACTION INVOLVE: Swelling of the face/tongue/throat, SOB, or low BP? Yes Sudden or severe rash/hives, skin peeling, or the inside of the mouth or nose? Unknown Did it require medical treatment? Unknown When did it last happen?[age]  If all above answers are NO, may proceed with cephalosporin use.   Statins    Sulfonamide Derivatives Hives   Apple Rash   Banana Rash    Review of Systems  Constitutional: Positive for malaise/fatigue. Negative for fever.  HENT: Negative for congestion.   Eyes: Negative for blurred vision.  Respiratory: Negative for shortness of breath.   Cardiovascular: Negative for chest pain, palpitations and leg swelling.  Gastrointestinal: Negative for abdominal pain, blood in stool  and nausea.  Genitourinary: Negative for dysuria and frequency.  Musculoskeletal: Positive for falls.  Skin: Negative for rash.  Neurological: Negative for dizziness, loss of consciousness and headaches.  Endo/Heme/Allergies: Negative for environmental allergies.  Psychiatric/Behavioral: Negative for depression. The patient is not  nervous/anxious.        Objective:    Physical Exam Vitals and nursing note reviewed.  Constitutional:      General: She is not in acute distress.    Appearance: She is well-developed.  HENT:     Head: Normocephalic and atraumatic.     Nose: Nose normal.  Eyes:     General:        Right eye: No discharge.        Left eye: No discharge.  Cardiovascular:     Rate and Rhythm: Normal rate and regular rhythm.     Heart sounds: Murmur heard.   Pulmonary:     Effort: Pulmonary effort is normal.     Breath sounds: Normal breath sounds.  Abdominal:     General: Bowel sounds are normal.     Palpations: Abdomen is soft.     Tenderness: There is no abdominal tenderness.  Musculoskeletal:     Cervical back: Normal range of motion and neck supple.  Skin:    General: Skin is warm and dry.     Findings: Bruising present.     Comments: Right flank  Neurological:     Mental Status: She is alert and oriented to person, place, and time.     BP 112/66 (BP Location: Left Arm)    Pulse 61    Temp 98.2 F (36.8 C)    Resp 16    Wt 184 lb 3.2 oz (83.6 kg)    SpO2 98%    BMI 32.63 kg/m  Wt Readings from Last 3 Encounters:  06/01/20 184 lb 3.2 oz (83.6 kg)  02/23/20 185 lb (83.9 kg)  01/02/20 188 lb (85.3 kg)    Diabetic Foot Exam - Simple   No data filed     Lab Results  Component Value Date   WBC 6.0 12/30/2019   HGB 12.7 12/30/2019   HCT 37.9 12/30/2019   PLT 239.0 12/30/2019   GLUCOSE 79 12/30/2019   CHOL 143 04/12/2020   TRIG 162 (H) 04/12/2020   HDL 44 04/12/2020   LDLDIRECT 103.0 07/14/2019   LDLCALC 71 04/12/2020   ALT 14 04/12/2020   AST 21 04/12/2020   NA 141 12/30/2019   K 4.7 12/30/2019   CL 106 12/30/2019   CREATININE 2.05 (H) 12/30/2019   BUN 24 (H) 12/30/2019   CO2 31 12/30/2019   TSH 1.78 04/02/2020   INR 1.79 07/18/2018   HGBA1C 5.2 12/30/2019    Lab Results  Component Value Date   TSH 1.78 04/02/2020   Lab Results  Component Value Date   WBC  6.0 12/30/2019   HGB 12.7 12/30/2019   HCT 37.9 12/30/2019   MCV 89.5 12/30/2019   PLT 239.0 12/30/2019   Lab Results  Component Value Date   NA 141 12/30/2019   K 4.7 12/30/2019   CO2 31 12/30/2019   GLUCOSE 79 12/30/2019   BUN 24 (H) 12/30/2019   CREATININE 2.05 (H) 12/30/2019   BILITOT 0.4 04/12/2020   ALKPHOS 58 04/12/2020   AST 21 04/12/2020   ALT 14 04/12/2020   PROT 6.5 04/12/2020   ALBUMIN 4.3 04/12/2020   CALCIUM 9.6 12/30/2019   ANIONGAP 13 03/15/2019   GFR 23.81 (  L) 12/30/2019   Lab Results  Component Value Date   CHOL 143 04/12/2020   Lab Results  Component Value Date   HDL 44 04/12/2020   Lab Results  Component Value Date   LDLCALC 71 04/12/2020   Lab Results  Component Value Date   TRIG 162 (H) 04/12/2020   Lab Results  Component Value Date   CHOLHDL 3.3 04/12/2020   Lab Results  Component Value Date   HGBA1C 5.2 12/30/2019       Assessment & Plan:   Problem List Items Addressed This Visit    Acid reflux    Avoid offending foods, start probiotics. Do not eat large meals in late evening and consider raising head of bed. Given prescription for FAmotidine 40 mg qhs.      Relevant Medications   famotidine (PEPCID) 40 MG tablet   CKD (chronic kidney disease), stage IV (HCC)    Following with nephrology, continue to avoid OTC suppplements and meds and hydrate well, has an appt with nephrology next week      Hypothyroidism    Refill given on Levothyroxine 112 mcg po daily      Relevant Medications   levothyroxine (SYNTHROID) 112 MCG tablet   CHF (congestive heart failure) (HCC)    No recent flare. Follows with cardiology      Hyperlipidemia, mixed    Encouraged heart healthy diet, increase exercise, avoid trans fats, consider a krill oil cap daily      Essential hypertension    Well controlled, no changes to meds. Encouraged heart healthy diet such as the DASH diet and exercise as tolerated.       Macular degeneration of both  eyes    Has an appointment with Dr Amedeo Plenty next week      Hyperglycemia    hgba1c acceptable, minimize simple carbs. Increase exercise as tolerated.       Vitamin D deficiency    Supplement and monitor      Breast mass, left    Recent imaging shows near complete resolution.          I am having Emily Mitchell maintain her Cholecalciferol, vitamin B-12, aspirin EC, Omega-3 Fatty Acids (FISH OIL PO), Lactobacillus (DIGESTIVE HEALTH PROBIOTIC PO), Multiple Vitamins-Minerals (PRESERVISION AREDS PO), Calcium Citrate-Vitamin D (CALCIUM + D PO), acetaminophen, aMILoride, amLODipine, Avastin, calcium citrate-vitamin D, albuterol, ezetimibe, clopidogrel, montelukast, clonazePAM, Fenofibric Acid, gabapentin, carvedilol, FLUoxetine, Breztri Aerosphere, atorvastatin, levothyroxine, and famotidine.  Meds ordered this encounter  Medications   levothyroxine (SYNTHROID) 112 MCG tablet    Sig: Take 1 tablet (112 mcg total) by mouth daily.    Dispense:  90 tablet    Refill:  1   famotidine (PEPCID) 40 MG tablet    Sig: Take 1 tablet (40 mg total) by mouth daily.    Dispense:  90 tablet    Refill:  1     Penni Homans, MD

## 2020-06-02 NOTE — Assessment & Plan Note (Signed)
No recent flare. Follows with cardiology

## 2020-06-02 NOTE — Assessment & Plan Note (Signed)
Has an appointment with Dr Amedeo Plenty next week

## 2020-06-02 NOTE — Assessment & Plan Note (Signed)
Well controlled, no changes to meds. Encouraged heart healthy diet such as the DASH diet and exercise as tolerated.  °

## 2020-06-02 NOTE — Assessment & Plan Note (Signed)
Avoid offending foods, start probiotics. Do not eat large meals in late evening and consider raising head of bed. Given prescription for FAmotidine 40 mg qhs.

## 2020-06-02 NOTE — Assessment & Plan Note (Signed)
Encouraged heart healthy diet, increase exercise, avoid trans fats, consider a krill oil cap daily 

## 2020-06-02 NOTE — Assessment & Plan Note (Signed)
Supplement and monitor 

## 2020-06-02 NOTE — Assessment & Plan Note (Signed)
Recent imaging shows near complete resolution.

## 2020-06-02 NOTE — Assessment & Plan Note (Addendum)
Following with nephrology, continue to avoid OTC suppplements and meds and hydrate well, has an appt with nephrology next week

## 2020-06-02 NOTE — Assessment & Plan Note (Signed)
hgba1c acceptable, minimize simple carbs. Increase exercise as tolerated.  

## 2020-06-02 NOTE — Assessment & Plan Note (Signed)
Refill given on Levothyroxine 112 mcg po daily

## 2020-06-03 ENCOUNTER — Other Ambulatory Visit: Payer: Self-pay

## 2020-06-03 ENCOUNTER — Encounter (HOSPITAL_BASED_OUTPATIENT_CLINIC_OR_DEPARTMENT_OTHER): Payer: Self-pay

## 2020-06-03 ENCOUNTER — Other Ambulatory Visit: Payer: Self-pay | Admitting: Family Medicine

## 2020-06-03 ENCOUNTER — Emergency Department (HOSPITAL_BASED_OUTPATIENT_CLINIC_OR_DEPARTMENT_OTHER)
Admission: EM | Admit: 2020-06-03 | Discharge: 2020-06-03 | Disposition: A | Discharge status: Home / Self Care | Payer: Medicare HMO | Attending: Emergency Medicine | Admitting: Emergency Medicine

## 2020-06-03 DIAGNOSIS — E039 Hypothyroidism, unspecified: Secondary | ICD-10-CM | POA: Diagnosis not present

## 2020-06-03 DIAGNOSIS — Z87891 Personal history of nicotine dependence: Secondary | ICD-10-CM | POA: Insufficient documentation

## 2020-06-03 DIAGNOSIS — N184 Chronic kidney disease, stage 4 (severe): Secondary | ICD-10-CM | POA: Diagnosis not present

## 2020-06-03 DIAGNOSIS — K529 Noninfective gastroenteritis and colitis, unspecified: Secondary | ICD-10-CM | POA: Diagnosis not present

## 2020-06-03 DIAGNOSIS — I509 Heart failure, unspecified: Secondary | ICD-10-CM | POA: Insufficient documentation

## 2020-06-03 DIAGNOSIS — I130 Hypertensive heart and chronic kidney disease with heart failure and stage 1 through stage 4 chronic kidney disease, or unspecified chronic kidney disease: Secondary | ICD-10-CM | POA: Diagnosis not present

## 2020-06-03 DIAGNOSIS — A059 Bacterial foodborne intoxication, unspecified: Secondary | ICD-10-CM

## 2020-06-03 DIAGNOSIS — Z7982 Long term (current) use of aspirin: Secondary | ICD-10-CM | POA: Diagnosis not present

## 2020-06-03 DIAGNOSIS — J45909 Unspecified asthma, uncomplicated: Secondary | ICD-10-CM | POA: Insufficient documentation

## 2020-06-03 DIAGNOSIS — Z7902 Long term (current) use of antithrombotics/antiplatelets: Secondary | ICD-10-CM | POA: Diagnosis not present

## 2020-06-03 DIAGNOSIS — Z79899 Other long term (current) drug therapy: Secondary | ICD-10-CM | POA: Insufficient documentation

## 2020-06-03 DIAGNOSIS — I10 Essential (primary) hypertension: Secondary | ICD-10-CM | POA: Diagnosis not present

## 2020-06-03 DIAGNOSIS — J449 Chronic obstructive pulmonary disease, unspecified: Secondary | ICD-10-CM | POA: Insufficient documentation

## 2020-06-03 DIAGNOSIS — R197 Diarrhea, unspecified: Secondary | ICD-10-CM | POA: Diagnosis present

## 2020-06-03 DIAGNOSIS — I2510 Atherosclerotic heart disease of native coronary artery without angina pectoris: Secondary | ICD-10-CM | POA: Insufficient documentation

## 2020-06-03 LAB — CBC WITH DIFFERENTIAL/PLATELET
Abs Immature Granulocytes: 0.03 10*3/uL (ref 0.00–0.07)
Basophils Absolute: 0.1 10*3/uL (ref 0.0–0.1)
Basophils Relative: 1 %
Eosinophils Absolute: 0.1 10*3/uL (ref 0.0–0.5)
Eosinophils Relative: 2 %
HCT: 37.6 % (ref 36.0–46.0)
Hemoglobin: 12.2 g/dL (ref 12.0–15.0)
Immature Granulocytes: 0 %
Lymphocytes Relative: 24 %
Lymphs Abs: 2 10*3/uL (ref 0.7–4.0)
MCH: 29.8 pg (ref 26.0–34.0)
MCHC: 32.4 g/dL (ref 30.0–36.0)
MCV: 91.7 fL (ref 80.0–100.0)
Monocytes Absolute: 0.7 10*3/uL (ref 0.1–1.0)
Monocytes Relative: 8 %
Neutro Abs: 5.6 10*3/uL (ref 1.7–7.7)
Neutrophils Relative %: 65 %
Platelets: 247 10*3/uL (ref 150–400)
RBC: 4.1 MIL/uL (ref 3.87–5.11)
RDW: 13 % (ref 11.5–15.5)
WBC: 8.5 10*3/uL (ref 4.0–10.5)
nRBC: 0 % (ref 0.0–0.2)

## 2020-06-03 LAB — COMPREHENSIVE METABOLIC PANEL
ALT: 17 U/L (ref 0–44)
AST: 21 U/L (ref 15–41)
Albumin: 3.8 g/dL (ref 3.5–5.0)
Alkaline Phosphatase: 41 U/L (ref 38–126)
Anion gap: 10 (ref 5–15)
BUN: 32 mg/dL — ABNORMAL HIGH (ref 8–23)
CO2: 24 mmol/L (ref 22–32)
Calcium: 9 mg/dL (ref 8.9–10.3)
Chloride: 108 mmol/L (ref 98–111)
Creatinine, Ser: 1.91 mg/dL — ABNORMAL HIGH (ref 0.44–1.00)
GFR, Estimated: 28 mL/min — ABNORMAL LOW (ref >60)
Glucose, Bld: 92 mg/dL (ref 70–99)
Potassium: 4.6 mmol/L (ref 3.5–5.1)
Sodium: 142 mmol/L (ref 135–145)
Total Bilirubin: 0.6 mg/dL (ref 0.3–1.2)
Total Protein: 6.6 g/dL (ref 6.5–8.1)

## 2020-06-03 LAB — LIPASE, BLOOD: Lipase: 53 U/L — ABNORMAL HIGH (ref 11–51)

## 2020-06-03 MED ORDER — SODIUM CHLORIDE 0.9 % IV BOLUS: 1000.0000 mL | Freq: Once | INTRAVENOUS | Status: DC

## 2020-06-03 MED ORDER — ONDANSETRON HCL 4 MG/2ML IJ SOLN
4.0000 mg | Freq: Once | INTRAMUSCULAR | Status: DC
  Filled 2020-06-03: qty 2

## 2020-06-03 NOTE — ED Triage Notes (Signed)
Pt c/o n/v/d started 7pm-states sx states sx started 1 hour after eating [place] food-NAD-slow gait

## 2020-06-03 NOTE — ED Provider Notes (Signed)
[facility] Provider Note   CSN: [id number] Arrival date & time: 06/03/20  2008     History Chief Complaint  Patient presents with  . Emesis    Emily Mitchell is a 74 y.o. female.  HPI   Emily Mitchell is a 74 y.o. lady with extensive past medical history as noted below notable for GERD, CKD stage IV, hypothyroidism, appendectomy / hysterectomy, presenting with acute diarrhea that began at 7:00pm tonight after she ate a burrito bowl with chicken, lettuce, and cheese at a [employer]. She states that about 1 hour after eating dinner, she developed lower abdominal discomfort, rumbling, and gas, and soon after had an episode of bowel incontinence with watery diarrhea. She is unsure if there was any blood or mucus in the stool. She then developed nausea without vomiting. She had a second smaller watery bowel movement in the waiting room here. She endorses continued gas but denies any weakness or feelings of dehydration. She has been able to tolerate fluids since her initial episode of diarrhea. She states she rarely eats fatty foods. She notes a recent fall of unclear mechanism October 19th with bruising but   Past Medical History:  Diagnosis Date  . Anemia 07/05/2014  . Anxiety and depression 12/14/2008   Qualifier: Diagnosis of  By: [name] LPN, Almyra Free    . Arthritis    "fingers, toes, knees, joints" (07/18/2018)  . Asthma   . Atypical chest pain 05/23/2015  . CAD (coronary artery disease)    a. [place] with cath 06/2018 s/p DES to LAD.  Marland Kitchen Carotid artery occlusion    a. R carotid occluded, 19-14% LICA.  Marland Kitchen CHF (congestive heart failure) ([place])   . Chicken pox as a child  . Chronic kidney disease    Bright's Disease at age 86   . COPD (chronic obstructive pulmonary disease) ([place])   . GERD (gastroesophageal reflux disease)   . H. pylori infection 10/19/2013  . Hepatitis 1970s   "don't know for sure which one it was; think it was A" (07/18/2018)  .  Hyperglycemia 03/14/2016  . Hyperlipidemia   . Hypertension   . Hypothyroidism   . Knee pain, bilateral 12/17/2012  . Left hip pain 05/23/2015  . Low back pain 09/19/2015  . Macular degeneration of both eyes 12/16/2015   Right eye is wet Left Eye is dry Shots every 11 to 12 weeks in right eye at opthamologist office  . Medicare annual wellness visit, subsequent 02/21/2015  . Mumps 74 yrs old  . Pneumonia    "at least once" (07/18/2018)  . Preventative health care 03/14/2016  . Pulmonary emboli ([place]) 05/26/2013  . Sleep apnea    "don't use CPAP anymore; dr said I didn't have to" (07/18/2018)  . Stroke [facility])    "been told I've had some strokes; didn't know I'd had them" (07/18/2018)  . Tremor   . UTI (lower urinary tract infection) 10/19/2013    Patient Active Problem List   Diagnosis Date Noted  . Carotid bruit 02/23/2020  . Statin intolerance 09/29/2019  . Breast mass, left 04/28/2019  . Hypercalcemia 03/09/2019  . Hoarseness 03/09/2019  . Back pain 02/02/2019  . History of food allergy 08/14/2018  . CAD (coronary artery disease) 07/25/2018  . CAD in native artery 07/19/2018  . Chest pain 07/18/2018  . History of pulmonary embolism 07/17/2018  . Renal insufficiency 07/17/2018  . H/O multiple allergies 07/14/2018  . Chronic rhinitis 06/14/2018  . Vitamin D deficiency  03/03/2018  . Candidal skin infection 02/28/2018  . COPD with asthma ([place]) 11/06/2017  . Lymphadenopathy 10/18/2017  . Right shoulder pain 10/18/2017  . Neck pain 04/22/2017  . Hyperglycemia 03/14/2016  . Preventative health care 03/14/2016  . Macular degeneration of both eyes 12/16/2015  . Essential hypertension 10/18/2015  . Exudative age-related macular degeneration of right eye with active choroidal neovascularization ([place]) 08/26/2015  . Carotid artery disease ([place]) 06/16/2015  . Left hip pain 05/23/2015  . Medicare annual wellness visit, subsequent 02/21/2015  . Anemia 07/05/2014  . Essential tremor  11/05/2013  . Lower urinary tract infectious disease 10/19/2013  . Hyperlipidemia, mixed 05/26/2013  . Pulmonary emboli ([place]) 05/26/2013  . Acid reflux   . Arthritis   . CKD (chronic kidney disease), stage IV ([place])   . Hypothyroidism   . CHF (congestive heart failure) ([place])   . Occlusion and stenosis of carotid artery without mention of cerebral infarction 12/17/2012  . Knee pain, bilateral 12/17/2012  . Numbness of finger 12/17/2012  . Constipation 12/17/2008  . Anxiety and depression 12/14/2008  . OSA (obstructive sleep apnea) 12/14/2008    Past Surgical History:  Procedure Laterality Date  . APPENDECTOMY  1984  . CARDIAC CATHETERIZATION  ~ 2006  . CAROTID ANGIOGRAPHY Left 08/20/2018   Procedure: CAROTID ANGIOGRAPHY;  Surgeon: Serafina Mitchell, MD;  Location: [place] [facility];  Service: Cardiovascular;  Laterality: Left;  . CAROTID ENDARTERECTOMY Right 05/10/07   cea  . CATARACT EXTRACTION Left   . CATARACT EXTRACTION W/ INTRAOCULAR LENS  IMPLANT, BILATERAL Bilateral   . CORONARY ANGIOPLASTY WITH STENT PLACEMENT  07/18/2018  . CORONARY STENT INTERVENTION N/A 07/18/2018   Procedure: CORONARY STENT INTERVENTION;  Surgeon: Lorretta Harp, MD;  Location: [place] [facility];  Service: Cardiovascular;  Laterality: N/A;  . INTRAVASCULAR PRESSURE WIRE/FFR STUDY N/A 07/18/2018   Procedure: INTRAVASCULAR PRESSURE WIRE/FFR STUDY;  Surgeon: Lorretta Harp, MD;  Location: [facility];  Service: Cardiovascular;  Laterality: N/A;  . JOINT REPLACEMENT    . LEFT HEART CATH AND CORONARY ANGIOGRAPHY N/A 07/18/2018   Procedure: LEFT HEART CATH AND CORONARY ANGIOGRAPHY;  Surgeon: Lorretta Harp, MD;  Location: [place];  Service: Cardiovascular;  Laterality: N/A;  . MULTIPLE TOOTH EXTRACTIONS    . TOTAL KNEE ARTHROPLASTY Bilateral 2005 - 2011   right - left  . VAGINAL HYSTERECTOMY  1984  . WISDOM TOOTH EXTRACTION       OB History   No obstetric history on file.      Family History  Problem Relation Age of Onset  . Stroke Mother        mini stroke  . Kidney disease Mother   . Heart failure Mother   . Hypertension Mother   . Diabetes Sister        type 2  . Kidney disease Sister   . Allergic rhinitis Sister   . Heart attack Maternal Grandfather   . Hyperlipidemia Sister   . Colon cancer Neg Hx     Social History   Tobacco Use  . Smoking status: Former Smoker    Packs/day: 2.00    Years: 30.00    Pack years: 60.00    Types: Cigarettes    Quit date: 12/03/1994    Years since quitting: 25.5  . Smokeless tobacco: Never Used  Vaping Use  . Vaping Use: Never used  Substance Use Topics  . Alcohol use: Yes    Comment: 07/18/2018 "1 drink q  1-2 months; if that"  . Drug use: Never    Home Medications Prior to Admission medications   Medication Sig Start Date End Date Taking? Authorizing Provider  acetaminophen (TYLENOL) 500 MG tablet Take by mouth.    [provider]  albuterol (VENTOLIN HFA) 108 (90 Base) MCG/ACT inhaler Inhale 2 puffs into the lungs every 6 (six) hours as needed for wheezing or shortness of breath. 01/28/20   Rigoberto Noel, MD  aMILoride (MIDAMOR) 5 MG tablet Take by mouth.    [provider]  amLODipine (NORVASC) 2.5 MG tablet Take by mouth.    [provider]  aspirin EC 81 MG tablet Take 1 tablet (81 mg total) by mouth daily. 07/17/18   Revankar, Reita Cliche, MD  atorvastatin (LIPITOR) 10 MG tablet TAKE ONE TABLET BY MOUTH DAILY 05/21/20   Revankar, Reita Cliche, MD  Bevacizumab (AVASTIN) 100 MG/4ML SOLN  08/05/19   [provider]  BREZTRI AEROSPHERE 160-9-4.8 MCG/ACT AERO INHALE TWO PUFFS BY MOUTH TWICE A DAY IN THE MORNING AND IN THE EVENING 05/07/20   Rigoberto Noel, MD  Calcium Citrate-Vitamin D (CALCIUM + D PO) Take 1 tablet by mouth daily.    [provider]  calcium citrate-vitamin D (CITRACAL+D) 315-200 MG-UNIT tablet Take by mouth.    [provider]  carvedilol  (COREG) 6.25 MG tablet TAKE ONE TABLET BY MOUTH TWICE A DAY WITH MEALS 05/04/20   Mosie Lukes, MD  Cholecalciferol (D3 MAXIMUM STRENGTH) 5000 UNITS capsule Take 5,000 Units by mouth daily.    [provider]  Choline Fenofibrate (FENOFIBRIC ACID) 135 MG CPDR TAKE ONE CAPSULE BY MOUTH DAILY 05/04/20   Mosie Lukes, MD  clonazePAM (KLONOPIN) 1 MG tablet TAKE ONE TABLET BY MOUTH THREE TIMES A DAY AS NEEDED FOR ANXIETY 05/02/20   Mosie Lukes, MD  clopidogrel (PLAVIX) 75 MG tablet Take 1 tablet (75 mg total) by mouth daily. 04/30/20   Mosie Lukes, MD  ezetimibe (ZETIA) 10 MG tablet Take 1 tablet (10 mg total) by mouth daily. 04/30/20   Mosie Lukes, MD  famotidine (PEPCID) 40 MG tablet Take 1 tablet (40 mg total) by mouth daily. 06/01/20   Mosie Lukes, MD  FLUoxetine (PROZAC) 20 MG tablet Take 1 tablet (20 mg total) by mouth daily. 06/03/20   Mosie Lukes, MD  gabapentin (NEURONTIN) 100 MG capsule TAKE TWO CAPSULES BY MOUTH EVERY NIGHT AT BEDTIME 05/04/20   Mosie Lukes, MD  Lactobacillus (DIGESTIVE HEALTH PROBIOTIC PO) Take 1 capsule by mouth daily.    [provider]  levothyroxine (SYNTHROID) 112 MCG tablet Take 1 tablet (112 mcg total) by mouth daily. 06/01/20   Mosie Lukes, MD  montelukast (SINGULAIR) 10 MG tablet Take 1 tablet (10 mg total) by mouth at bedtime as needed. 04/30/20   Mosie Lukes, MD  Multiple Vitamins-Minerals (PRESERVISION AREDS PO) Take 1 capsule by mouth daily.    [provider]  Omega-3 Fatty Acids (FISH OIL PO) Take 1 capsule by mouth daily.    [provider]  vitamin B-12 (CYANOCOBALAMIN) 500 MCG tablet Take 500 mcg by mouth daily.     [provider]    Allergies    Niaspan [niacin er], Pantoprazole, Bupropion, Nitroglycerin, Penicillins, Statins, Sulfonamide derivatives, Apple, and Banana  Review of Systems   Review of Systems: 10 point review of systems negative except as noted above in HPI.    Physical Exam Updated Vital Signs BP Marland Kitchen)  177/85   Pulse 68   Temp 98.6 F (37 C) (Oral)   Resp 14   Ht 5\' 3"  (1.6 m)   SpO2 99%   BMI 32.63 kg/m   Physical Exam   General: Patient appears well. No acute distress. Eyes: Sclera non-icteric. No conjunctival injection.  HENT: Neck is supple. No nasal discharge. Respiratory: Lungs are CTA, bilaterally. No wheezes, rales, or rhonchi.  Cardiovascular: Regular rate and rhythm. No murmurs, rubs, or gallops. Abdominal: Soft and non-tender to palpation. Bowel sounds intact. No rebound or guarding. Neurological: Alert and oriented x 3. CN II-XII grossly intact.  MSK: Strength is 5/5 in all extremities with normal muscle bulk and tone.  Skin: No lesions. No rashes. Warm and dry.  Psych: Normal affect. Normal tone of voice.    ED Results / Procedures / Treatments   Labs (all labs ordered are listed, but only abnormal results are displayed) Labs Reviewed  COMPREHENSIVE METABOLIC PANEL - Abnormal; Notable for the following components:      Result Value   BUN 32 (*)    Creatinine, Ser 1.91 (*)    GFR, Estimated 28 (*)    All other components within normal limits  LIPASE, BLOOD - Abnormal; Notable for the following components:   Lipase 53 (*)    All other components within normal limits  CBC WITH DIFFERENTIAL/PLATELET    EKG EKG Interpretation  Date/Time:  Thursday June 03 2020 21:17:09 EDT Ventricular Rate:  65 PR Interval:    QRS Duration: 66 QT Interval:  396 QTC Calculation: 412 R Axis:   20 Text Interpretation: Sinus rhythm Prolonged PR interval Low voltage, precordial leads Abnormal R-wave progression, early transition No significant change since last tracing Confirmed by Wandra Arthurs [phone]) on 06/03/2020 9:49:06 PM   Radiology No results found.  Procedures Procedures (including critical care time)  Medications Ordered in ED Medications  ondansetron (ZOFRAN) injection 4 mg (4 mg Intravenous Not Given 06/03/20  2152)    ED Course  I have reviewed the triage vital signs and the nursing notes.  Pertinent labs & imaging results that were available during my care of the patient were reviewed by me and considered in my medical decision making (see chart for details).    MDM Rules/Calculators/A&P                          Emily Mitchell presents with acute-onset diarrhea and nausea 1 hour after eating chicken and lettuce that has self-resolved. She has remained hemodynamically stable and did not appear dry on examination, without abdominal tenderness to palpation or fevers. Findings are consistent with an acute food borne illness vs. Other gastritis. Patient takes Pepcid at home.   CMP is consistent with chronic kidney disease with mild elevation in lipase likely due to recent diarrhea, and CBC with differential is unremarkable without leukocytosis. Patient remains comfortable here without needing fluid resuscitation. Patient is stable for discharge home with return precautions provided.   Final Clinical Impression(s) / ED Diagnoses Final diagnoses:  Acute gastroenteritis  Foodborne gastroenteritis    Rx / DC Orders ED Discharge Orders    None     Jeralyn Bennett, MD 06/03/2020, 11:27 PM Pager: [phone]    Jeralyn Bennett, MD 06/03/20 2327    Drenda Freeze, MD 06/08/20 [phone]

## 2020-06-03 NOTE — Discharge Instructions (Signed)
Emily Mitchell,   We discussed that your acute-onset diarrhea and nausea following your dinner is likely due to acute gastroenteritis, likely due to acute food poisoning. Your lab work returned unremarkable today.   Please be sure to stay hydrated and return to the ED vs. Call your PCP if you experience persistent diarrhea, fevers, chills, abdominal pain, or any other symptoms.   Thank you,  Dr. Konrad Penta

## 2020-06-03 NOTE — ED Notes (Signed)
Pt placed on the monitor.

## 2020-06-03 NOTE — ED Notes (Signed)
Discharge instructions discussed with patient. Verbalized understanding. Departs ED at this time in stable condition.  

## 2020-06-09 DIAGNOSIS — H43822 Vitreomacular adhesion, left eye: Secondary | ICD-10-CM | POA: Diagnosis not present

## 2020-06-09 DIAGNOSIS — H35342 Macular cyst, hole, or pseudohole, left eye: Secondary | ICD-10-CM | POA: Diagnosis not present

## 2020-06-09 DIAGNOSIS — H353212 Exudative age-related macular degeneration, right eye, with inactive choroidal neovascularization: Secondary | ICD-10-CM | POA: Diagnosis not present

## 2020-06-09 DIAGNOSIS — H353122 Nonexudative age-related macular degeneration, left eye, intermediate dry stage: Secondary | ICD-10-CM | POA: Diagnosis not present

## 2020-06-09 DIAGNOSIS — Z961 Presence of intraocular lens: Secondary | ICD-10-CM | POA: Diagnosis not present

## 2020-06-15 DIAGNOSIS — N179 Acute kidney failure, unspecified: Secondary | ICD-10-CM | POA: Diagnosis not present

## 2020-06-15 DIAGNOSIS — D631 Anemia in chronic kidney disease: Secondary | ICD-10-CM | POA: Diagnosis not present

## 2020-06-15 DIAGNOSIS — E669 Obesity, unspecified: Secondary | ICD-10-CM | POA: Diagnosis not present

## 2020-06-15 DIAGNOSIS — N2581 Secondary hyperparathyroidism of renal origin: Secondary | ICD-10-CM | POA: Diagnosis not present

## 2020-06-15 DIAGNOSIS — R251 Tremor, unspecified: Secondary | ICD-10-CM | POA: Diagnosis not present

## 2020-06-15 DIAGNOSIS — N184 Chronic kidney disease, stage 4 (severe): Secondary | ICD-10-CM | POA: Diagnosis not present

## 2020-06-15 DIAGNOSIS — I509 Heart failure, unspecified: Secondary | ICD-10-CM | POA: Diagnosis not present

## 2020-06-15 DIAGNOSIS — I129 Hypertensive chronic kidney disease with stage 1 through stage 4 chronic kidney disease, or unspecified chronic kidney disease: Secondary | ICD-10-CM | POA: Diagnosis not present

## 2020-06-15 DIAGNOSIS — E785 Hyperlipidemia, unspecified: Secondary | ICD-10-CM | POA: Diagnosis not present

## 2020-06-29 LAB — FECAL OCCULT BLOOD, IMMUNOCHEMICAL: IFOBT: NEGATIVE

## 2020-07-01 ENCOUNTER — Ambulatory Visit (INDEPENDENT_AMBULATORY_CARE_PROVIDER_SITE_OTHER): Payer: Medicare HMO | Admitting: Family Medicine

## 2020-07-01 ENCOUNTER — Other Ambulatory Visit: Payer: Self-pay

## 2020-07-01 DIAGNOSIS — I10 Essential (primary) hypertension: Secondary | ICD-10-CM | POA: Diagnosis not present

## 2020-07-01 NOTE — Progress Notes (Signed)
Pt here for Blood pressure check per Dr. Charlett Blake  Pt currently takes: Amlodipine 2.5mg  and amiloride 5mg    Pt reports compliance with medication.  BP today @ =114/62 HR =65  Pt advised per Dr. Lorelei Pont (DOD) pt states she should keep doing what she is doing.  Pt is to return to see PCP 11/04/2020

## 2020-07-12 DIAGNOSIS — N1832 Chronic kidney disease, stage 3b: Secondary | ICD-10-CM | POA: Diagnosis not present

## 2020-07-26 ENCOUNTER — Other Ambulatory Visit: Payer: Self-pay | Admitting: Family Medicine

## 2020-08-02 ENCOUNTER — Other Ambulatory Visit: Payer: Self-pay | Admitting: Family Medicine

## 2020-08-11 DIAGNOSIS — H353122 Nonexudative age-related macular degeneration, left eye, intermediate dry stage: Secondary | ICD-10-CM | POA: Diagnosis not present

## 2020-08-11 DIAGNOSIS — H3554 Dystrophies primarily involving the retinal pigment epithelium: Secondary | ICD-10-CM | POA: Diagnosis not present

## 2020-08-11 DIAGNOSIS — H43823 Vitreomacular adhesion, bilateral: Secondary | ICD-10-CM | POA: Diagnosis not present

## 2020-08-11 DIAGNOSIS — H353212 Exudative age-related macular degeneration, right eye, with inactive choroidal neovascularization: Secondary | ICD-10-CM | POA: Diagnosis not present

## 2020-08-11 DIAGNOSIS — Z961 Presence of intraocular lens: Secondary | ICD-10-CM | POA: Diagnosis not present

## 2020-08-11 DIAGNOSIS — H35342 Macular cyst, hole, or pseudohole, left eye: Secondary | ICD-10-CM | POA: Diagnosis not present

## 2020-08-11 DIAGNOSIS — H35361 Drusen (degenerative) of macula, right eye: Secondary | ICD-10-CM | POA: Diagnosis not present

## 2020-08-24 ENCOUNTER — Other Ambulatory Visit: Payer: Self-pay

## 2020-08-24 DIAGNOSIS — R251 Tremor, unspecified: Secondary | ICD-10-CM | POA: Insufficient documentation

## 2020-08-24 DIAGNOSIS — J189 Pneumonia, unspecified organism: Secondary | ICD-10-CM | POA: Insufficient documentation

## 2020-08-24 DIAGNOSIS — G4730 Sleep apnea, unspecified: Secondary | ICD-10-CM | POA: Insufficient documentation

## 2020-08-24 DIAGNOSIS — I10 Essential (primary) hypertension: Secondary | ICD-10-CM | POA: Insufficient documentation

## 2020-08-24 DIAGNOSIS — I6529 Occlusion and stenosis of unspecified carotid artery: Secondary | ICD-10-CM | POA: Insufficient documentation

## 2020-08-24 DIAGNOSIS — K759 Inflammatory liver disease, unspecified: Secondary | ICD-10-CM | POA: Insufficient documentation

## 2020-08-24 DIAGNOSIS — B269 Mumps without complication: Secondary | ICD-10-CM | POA: Insufficient documentation

## 2020-08-24 DIAGNOSIS — E785 Hyperlipidemia, unspecified: Secondary | ICD-10-CM | POA: Insufficient documentation

## 2020-08-24 DIAGNOSIS — I639 Cerebral infarction, unspecified: Secondary | ICD-10-CM | POA: Insufficient documentation

## 2020-08-24 DIAGNOSIS — B019 Varicella without complication: Secondary | ICD-10-CM | POA: Insufficient documentation

## 2020-08-24 DIAGNOSIS — K219 Gastro-esophageal reflux disease without esophagitis: Secondary | ICD-10-CM | POA: Insufficient documentation

## 2020-08-24 DIAGNOSIS — J449 Chronic obstructive pulmonary disease, unspecified: Secondary | ICD-10-CM | POA: Insufficient documentation

## 2020-08-24 DIAGNOSIS — J45909 Unspecified asthma, uncomplicated: Secondary | ICD-10-CM | POA: Insufficient documentation

## 2020-08-24 DIAGNOSIS — I509 Heart failure, unspecified: Secondary | ICD-10-CM | POA: Insufficient documentation

## 2020-08-25 ENCOUNTER — Other Ambulatory Visit: Payer: Self-pay

## 2020-08-25 ENCOUNTER — Encounter: Payer: Self-pay | Admitting: Cardiology

## 2020-08-25 ENCOUNTER — Ambulatory Visit: Payer: Medicare Other | Admitting: Cardiology

## 2020-08-25 VITALS — BP 128/68 | HR 62 | Ht 63.0 in | Wt 183.0 lb

## 2020-08-25 DIAGNOSIS — N289 Disorder of kidney and ureter, unspecified: Secondary | ICD-10-CM | POA: Diagnosis not present

## 2020-08-25 DIAGNOSIS — I2510 Atherosclerotic heart disease of native coronary artery without angina pectoris: Secondary | ICD-10-CM | POA: Diagnosis not present

## 2020-08-25 DIAGNOSIS — Z86711 Personal history of pulmonary embolism: Secondary | ICD-10-CM | POA: Diagnosis not present

## 2020-08-25 DIAGNOSIS — I6523 Occlusion and stenosis of bilateral carotid arteries: Secondary | ICD-10-CM

## 2020-08-25 DIAGNOSIS — E782 Mixed hyperlipidemia: Secondary | ICD-10-CM

## 2020-08-25 DIAGNOSIS — I10 Essential (primary) hypertension: Secondary | ICD-10-CM | POA: Diagnosis not present

## 2020-08-25 NOTE — Progress Notes (Signed)
Cardiology Office Note:    Date:  08/25/2020   ID:  Emily Mitchell, DOB [birth year], MRN [id number]  PCP:  Mosie Lukes, MD  Cardiologist:  Jenean Lindau, MD   Referring MD: Mosie Lukes, MD    ASSESSMENT:    1. Coronary artery disease involving native coronary artery of native heart without angina pectoris   2. Bilateral carotid artery stenosis   3. Essential hypertension   4. Hyperlipidemia, mixed   5. History of pulmonary embolism   6. Renal insufficiency    PLAN:    In order of problems listed above:  1. Coronary artery disease: Secondary prevention stressed with the patient. Importance of compliance with diet medication stressed and she vocalized understanding. Importance of regular exercise stressed and she promises to do this meticulously. 2. Essential hypertension: Blood pressure stable and diet was emphasized. Lifestyle modification urged. 3. Mixed dyslipidemia: Lipids from September were reviewed and they are fine and I discussed with the patient. Diet emphasized. 4. Carotid artery disease: Stable at this time and we will continue to monitor. 5. Obesity: Weight reduction was stressed and she promises to do better. Diet was emphasized. 6. Renal insufficiency: Managed by primary care physician and I highlighted the importance of talking to her primary care physician before taking any over-the-counter medications. 7. Patient will be seen in follow-up appointment in 6 months or earlier if the patient has any concerns    Medication Adjustments/Labs and Tests Ordered: Current medicines are reviewed at length with the patient today.  Concerns regarding medicines are outlined above.  No orders of the defined types were placed in this encounter.  No orders of the defined types were placed in this encounter.    No chief complaint on file.    History of Present Illness:    Emily Mitchell is a 75 y.o. female. Patient has past medical history of coronary artery  disease, essential hypertension, dyslipidemia and also carotid arteries with stenosis. She denies any problems at this time and takes care of activities of daily living. No chest pain orthopnea or PND. She was told to have sleep apnea in the past but never followed up on it. At the time of my evaluation, the patient is alert awake oriented and in no distress. She leads a sedentary lifestyle but uses a bicycle to exercise.  Past Medical History:  Diagnosis Date  . Acid reflux   . Anemia 07/05/2014  . Anxiety and depression 12/14/2008   Qualifier: Diagnosis of  By: [name] LPN, Almyra Free    . Arthritis    "fingers, toes, knees, joints" (07/18/2018)  . Asthma   . Atypical chest pain 05/23/2015  . Back pain 02/02/2019  . Breast mass, left 04/28/2019  . CAD (coronary artery disease)    a. [place] with cath 06/2018 s/p DES to LAD.  Marland Kitchen CAD in native artery 07/19/2018  . Candidal skin infection 02/28/2018  . Carotid artery disease ([place]) 06/16/2015   S/p CEA   . Carotid artery occlusion    a. R carotid occluded, 19-62% LICA.  Marland Kitchen Carotid bruit 02/23/2020  . Chest pain 07/18/2018  . CHF (congestive heart failure) ([place])   . Chicken pox as a child  . Chronic kidney disease    Bright's Disease at age 48   . Chronic obstructive pulmonary disease ([place]) 12/14/2008   Formatting of this note might be different from the original. Overview:  Spirometry 05/12/2013: FEV1 72% predicted FEV1 FVC ratio 73 predicted FEF 25 75  43% predicted  Last Assessment & Plan:  OK to stop taking advair Use albuterol as needed only Check oxygen levels during sleep - we will call you with results Call if symptoms worse  . Chronic rhinitis 06/14/2018  . CKD (chronic kidney disease), stage IV [facility])    Follows with [facility], Dr Alonza Smoker   . Congestive heart failure ([place]) 11/30/2015   Formatting of this note might be different from the original. Last Assessment & Plan:  No recent exacerbation  . Constipation 12/17/2008   Qualifier:  Diagnosis of  By: West[place] Pam.   . COPD (chronic obstructive pulmonary disease) ([place])   . COPD with asthma ([place]) 11/06/2017  . Essential hypertension 10/18/2015   Formatting of this note might be different from the original. Last Assessment & Plan:  poorlycontrolled and under a great deal of stress, add Metoprolol XL 25 mg daily. Encouraged heart healthy diet such as the DASH diet and exercise as tolerated.  . Essential tremor 11/05/2013  . Exudative age-related macular degeneration of right eye with active choroidal neovascularization ([place]) 08/26/2015  . Gastroesophageal reflux disease 12/14/2008   Formatting of this note might be different from the original. Overview:  Qualifier: Diagnosis of  By: [name] LPN, Almyra Free   Last Assessment & Plan:  Avoid offending foods, start probiotics. Do not eat large meals in late evening and consider raising head of bed.  Marland Kitchen GERD (gastroesophageal reflux disease)   . H. pylori infection 10/19/2013  . H/O multiple allergies 07/14/2018  . Helicobacter pylori (H. pylori) as the cause of diseases classified elsewhere 10/19/2013   Formatting of this note might be different from the original. Last Assessment & Plan:  Tetracycline, Pepto Bismol, Flagyl and Omeprazole.  . Hepatitis 1970s   "don't know for sure which one it was; think it was A" (07/18/2018)  . History of food allergy 08/14/2018  . History of pulmonary embolism 07/17/2018  . Hoarseness 03/09/2019  . Hypercalcemia 03/09/2019  . Hyperglycemia 03/14/2016  . Hyperlipidemia   . Hyperlipidemia, mixed 05/26/2013   Crestor caused N/V Did not tolerate Lipitor   . Hypertension   . Hypothyroidism   . Knee pain, bilateral 12/17/2012  . Left hip pain 05/23/2015  . Low back pain 09/19/2015  . Lower urinary tract infectious disease 10/19/2013  . Lymphadenopathy 10/18/2017  . Macular degeneration of both eyes 12/16/2015   Right eye is wet Left Eye is dry Shots every 11 to 12 weeks in right eye at opthamologist office  .  Medicare annual wellness visit, subsequent 02/21/2015  . Mixed anxiety depressive disorder 12/14/2008   Formatting of this note might be different from the original. Overview:  Qualifier: Diagnosis of  By: [name] LPN, Almyra Free  Patient had increased  Tremors on Bupropion  Last Assessment & Plan:  Is struggling with the stress of her 1 year old granddaughter whom she raised moving back with her own mother, the patient is estranged from her daughter so she is very upset. Increase Citalopram 40 mg daily   . Mumps 75 yrs old  . Neck pain 04/22/2017  . Numbness of finger 12/17/2012  . Occlusion and stenosis of carotid artery without mention of cerebral infarction 12/17/2012  . OSA (obstructive sleep apnea) 12/14/2008   PSG  06/2013-AHI 6/h 08/2011 ([facility]) AHI 4/h No longer needs CPAP   . Pneumonia    "at least once" (07/18/2018)  . Preventative health care 03/14/2016  . Pulmonary emboli ([place]) 05/26/2013  . Renal insufficiency 07/17/2018  .  Right shoulder pain 10/18/2017  . Sensation of foreign body in larynx 04/08/2019  . Sleep apnea    "don't use CPAP anymore; dr said I didn't have to" (07/18/2018)  . Static tremor 11/05/2013   Formatting of this note might be different from the original. Last Assessment & Plan:  Follows with [facility]  . Statin intolerance 09/29/2019  . Stroke [facility])    "been told I've had some strokes; didn't know I'd had them" (07/18/2018)  . Tremor   . UTI (lower urinary tract infection) 10/19/2013  . Vitamin D deficiency 03/03/2018  . Vitreomacular adhesion of both eyes 01/07/2019   Formatting of this note might be different from the original. no traction    Past Surgical History:  Procedure Laterality Date  . APPENDECTOMY  1984  . CARDIAC CATHETERIZATION  ~ 2006  . CAROTID ANGIOGRAPHY Left 08/20/2018   Procedure: CAROTID ANGIOGRAPHY;  Surgeon: Serafina Mitchell, MD;  Location: [place] [facility];  Service: Cardiovascular;  Laterality: Left;  . CAROTID ENDARTERECTOMY Right 05/10/07    cea  . CATARACT EXTRACTION Left   . CATARACT EXTRACTION W/ INTRAOCULAR LENS  IMPLANT, BILATERAL Bilateral   . CORONARY ANGIOPLASTY WITH STENT PLACEMENT  07/18/2018  . CORONARY STENT INTERVENTION N/A 07/18/2018   Procedure: CORONARY STENT INTERVENTION;  Surgeon: Lorretta Harp, MD;  Location: [place] [facility];  Service: Cardiovascular;  Laterality: N/A;  . INTRAVASCULAR PRESSURE WIRE/FFR STUDY N/A 07/18/2018   Procedure: INTRAVASCULAR PRESSURE WIRE/FFR STUDY;  Surgeon: Lorretta Harp, MD;  Location: [place] [facility];  Service: Cardiovascular;  Laterality: N/A;  . JOINT REPLACEMENT    . LEFT HEART CATH AND CORONARY ANGIOGRAPHY N/A 07/18/2018   Procedure: LEFT HEART CATH AND CORONARY ANGIOGRAPHY;  Surgeon: Lorretta Harp, MD;  Location: [place] [facility];  Service: Cardiovascular;  Laterality: N/A;  . MULTIPLE TOOTH EXTRACTIONS    . TOTAL KNEE ARTHROPLASTY Bilateral 2005 - 2011   right - left  . VAGINAL HYSTERECTOMY  1984  . WISDOM TOOTH EXTRACTION      Current Medications: Current Meds  Medication Sig  . acetaminophen (TYLENOL) 500 MG tablet Take 500 mg by mouth as needed.  Marland Kitchen albuterol (VENTOLIN HFA) 108 (90 Base) MCG/ACT inhaler Inhale 2 puffs into the lungs every 6 (six) hours as needed for wheezing or shortness of breath.  Marland Kitchen aMILoride (MIDAMOR) 5 MG tablet Take 5 mg by mouth daily.  Marland Kitchen amLODipine (NORVASC) 2.5 MG tablet Take 1 tablet (2.5 mg total) by mouth daily.  Marland Kitchen aspirin EC 81 MG tablet Take 1 tablet (81 mg total) by mouth daily.  Marland Kitchen atorvastatin (LIPITOR) 10 MG tablet TAKE ONE TABLET BY MOUTH DAILY  . BREZTRI AEROSPHERE 160-9-4.8 MCG/ACT AERO INHALE TWO PUFFS BY MOUTH TWICE A DAY IN THE MORNING AND IN THE EVENING  . Calcium Citrate-Vitamin D (CALCIUM + D PO) Take 1 tablet by mouth daily.  . Calcium Citrate-Vitamin D 200-250 MG-UNIT TABS Take 1 tablet by mouth daily.  . carvedilol (COREG) 6.25 MG tablet Take 1 tablet (6.25 mg total) by mouth 2 (two) times daily with  a meal.  . Cholecalciferol 25 MCG (1000 UT) tablet Take 1,000 Units by mouth daily.  . Choline Fenofibrate (FENOFIBRIC ACID) 135 MG CPDR Take 1 capsule by mouth daily.  . clonazePAM (KLONOPIN) 1 MG tablet TAKE ONE TABLET BY MOUTH THREE TIMES A DAY AS NEEDED FOR ANXIETY  . clopidogrel (PLAVIX) 75 MG tablet Take 1 tablet (75 mg total) by mouth daily.  Marland Kitchen  cyanocobalamin 100 MCG tablet Take 500 mcg by mouth daily.  Marland Kitchen esomeprazole (NEXIUM) 40 MG capsule Take 40 mg by mouth daily.  Marland Kitchen ezetimibe (ZETIA) 10 MG tablet Take 1 tablet (10 mg total) by mouth daily.  . famotidine (PEPCID) 40 MG tablet Take 1 tablet (40 mg total) by mouth daily.  . fenofibrate micronized (LOFIBRA) 134 MG capsule Take 134 mg by mouth daily.  Marland Kitchen FLUoxetine (PROZAC) 20 MG tablet Take 1 tablet (20 mg total) by mouth daily.  Marland Kitchen gabapentin (NEURONTIN) 100 MG capsule Take 2 capsules (200 mg total) by mouth at bedtime.  . Lactobacillus (DIGESTIVE HEALTH PROBIOTIC PO) Take 1 capsule by mouth daily.  Marland Kitchen levothyroxine (SYNTHROID) 112 MCG tablet Take 1 tablet (112 mcg total) by mouth daily.  . montelukast (SINGULAIR) 10 MG tablet Take 1 tablet (10 mg total) by mouth at bedtime as needed.  . Multiple Vitamins-Minerals (PRESERVISION AREDS PO) Take 1 capsule by mouth daily.  . Omega-3 Fatty Acids (FISH OIL PO) Take 1 capsule by mouth daily.  . Probiotic Product (VSL#3) CAPS Take 1 capsule by mouth daily.     Allergies:   Niaspan [niacin er], Pantoprazole, Bupropion, Nitroglycerin, Penicillins, Statins, Sulfonamide derivatives, Apple, and Banana   Social History   Socioeconomic History  . Marital status: Divorced    Spouse name: Not on file  . Number of children: 2  . Years of education: Not on file  . Highest education level: Not on file  Occupational History  . Not on file  Tobacco Use  . Smoking status: Former Smoker    Packs/day: 2.00    Years: 30.00    Pack years: 60.00    Types: Cigarettes    Quit date: 12/03/1994    Years  since quitting: 25.7  . Smokeless tobacco: Never Used  Vaping Use  . Vaping Use: Never used  Substance and Sexual Activity  . Alcohol use: Yes    Comment: 07/18/2018 "1 drink q 1-2 months; if that"  . Drug use: Never  . Sexual activity: Not Currently    Comment: lives with son and friend, no dietary restrictions  Other Topics Concern  . Not on file  Social History Narrative   Right handed   No caffeine   One story home   Social Determinants of Health   Financial Resource Strain: Not on file  Food Insecurity: Not on file  Transportation Needs: Not on file  Physical Activity: Not on file  Stress: Not on file  Social Connections: Not on file     Family History: The patient's family history includes Allergic rhinitis in her sister; Diabetes in her sister; Heart attack in her maternal grandfather; Heart failure in her mother; Hyperlipidemia in her sister; Hypertension in her mother; Kidney disease in her mother and sister; Stroke in her mother. There is no history of Colon cancer.  ROS:   Please see the history of present illness.    All other systems reviewed and are negative.  EKGs/Labs/Other Studies Reviewed:    The following studies were reviewed today: Summary:  Right Carotid: Evidence consistent with a total occlusion of the right  ICA.   Left Carotid: Velocities in the left ICA are consistent with a 40-59%  stenosis.   Vertebrals: Bilateral vertebral arteries demonstrate antegrade flow.  Subclavians: Normal flow hemodynamics were seen in bilateral subclavian        arteries.   *See table(s) above for measurements and observations.    Recent Labs: 04/02/2020: TSH 1.78 06/03/2020:  ALT 17; BUN 32; Creatinine, Ser 1.91; Hemoglobin 12.2; Platelets 247; Potassium 4.6; Sodium 142  Recent Lipid Panel    Component Value Date/Time   CHOL 143 04/12/2020 1137   TRIG 162 (H) 04/12/2020 1137   HDL 44 04/12/2020 1137   CHOLHDL 3.3 04/12/2020 1137   CHOLHDL 4  12/30/2019 1438   VLDL 31.2 12/30/2019 1438   LDLCALC 71 04/12/2020 1137   LDLDIRECT 103.0 07/14/2019 1323    Physical Exam:    VS:  BP 128/68   Pulse 62   Ht 5\' 3"  (1.6 m)   Wt 183 lb (83 kg)   SpO2 98%   BMI 32.42 kg/m     Wt Readings from Last 3 Encounters:  08/25/20 183 lb (83 kg)  06/01/20 184 lb 3.2 oz (83.6 kg)  02/23/20 185 lb (83.9 kg)     GEN: Patient is in no acute distress HEENT: Normal NECK: No JVD; No carotid bruits LYMPHATICS: No lymphadenopathy CARDIAC: Hear sounds regular, 2/6 systolic murmur at the apex. RESPIRATORY:  Clear to auscultation without rales, wheezing or rhonchi  ABDOMEN: Soft, non-tender, non-distended MUSCULOSKELETAL:  No edema; No deformity  SKIN: Warm and dry NEUROLOGIC:  Alert and oriented x 3 PSYCHIATRIC:  Normal affect   Signed, Jenean Lindau, MD  08/25/2020 1:17 PM    [facility]

## 2020-08-25 NOTE — Patient Instructions (Signed)

## 2020-09-13 DIAGNOSIS — N1832 Chronic kidney disease, stage 3b: Secondary | ICD-10-CM | POA: Diagnosis not present

## 2020-10-04 DIAGNOSIS — I509 Heart failure, unspecified: Secondary | ICD-10-CM | POA: Diagnosis not present

## 2020-10-04 DIAGNOSIS — R251 Tremor, unspecified: Secondary | ICD-10-CM | POA: Diagnosis not present

## 2020-10-04 DIAGNOSIS — E785 Hyperlipidemia, unspecified: Secondary | ICD-10-CM | POA: Diagnosis not present

## 2020-10-04 DIAGNOSIS — N179 Acute kidney failure, unspecified: Secondary | ICD-10-CM | POA: Diagnosis not present

## 2020-10-04 DIAGNOSIS — N184 Chronic kidney disease, stage 4 (severe): Secondary | ICD-10-CM | POA: Diagnosis not present

## 2020-10-04 DIAGNOSIS — D631 Anemia in chronic kidney disease: Secondary | ICD-10-CM | POA: Diagnosis not present

## 2020-10-04 DIAGNOSIS — I129 Hypertensive chronic kidney disease with stage 1 through stage 4 chronic kidney disease, or unspecified chronic kidney disease: Secondary | ICD-10-CM | POA: Diagnosis not present

## 2020-10-04 DIAGNOSIS — N2581 Secondary hyperparathyroidism of renal origin: Secondary | ICD-10-CM | POA: Diagnosis not present

## 2020-10-06 DIAGNOSIS — H3554 Dystrophies primarily involving the retinal pigment epithelium: Secondary | ICD-10-CM | POA: Diagnosis not present

## 2020-10-06 DIAGNOSIS — H35363 Drusen (degenerative) of macula, bilateral: Secondary | ICD-10-CM | POA: Diagnosis not present

## 2020-10-06 DIAGNOSIS — H353122 Nonexudative age-related macular degeneration, left eye, intermediate dry stage: Secondary | ICD-10-CM | POA: Diagnosis not present

## 2020-10-06 DIAGNOSIS — H35342 Macular cyst, hole, or pseudohole, left eye: Secondary | ICD-10-CM | POA: Diagnosis not present

## 2020-10-06 DIAGNOSIS — Z961 Presence of intraocular lens: Secondary | ICD-10-CM | POA: Diagnosis not present

## 2020-10-06 DIAGNOSIS — H353212 Exudative age-related macular degeneration, right eye, with inactive choroidal neovascularization: Secondary | ICD-10-CM | POA: Diagnosis not present

## 2020-10-06 DIAGNOSIS — H43823 Vitreomacular adhesion, bilateral: Secondary | ICD-10-CM | POA: Diagnosis not present

## 2020-10-19 ENCOUNTER — Encounter: Payer: Self-pay | Admitting: Physician Assistant

## 2020-10-19 ENCOUNTER — Ambulatory Visit: Payer: Medicare Other | Admitting: Physician Assistant

## 2020-10-19 ENCOUNTER — Ambulatory Visit (HOSPITAL_COMMUNITY)
Admission: RE | Admit: 2020-10-19 | Discharge: 2020-10-19 | Disposition: A | Discharge status: Home / Self Care | Payer: Medicare Other | Source: Ambulatory Visit | Attending: Vascular Surgery | Admitting: Vascular Surgery

## 2020-10-19 ENCOUNTER — Other Ambulatory Visit: Payer: Self-pay

## 2020-10-19 VITALS — BP 126/62 | HR 59 | Temp 98.7°F | Resp 20 | Ht 63.0 in | Wt 182.9 lb

## 2020-10-19 DIAGNOSIS — I6523 Occlusion and stenosis of bilateral carotid arteries: Secondary | ICD-10-CM | POA: Insufficient documentation

## 2020-10-19 DIAGNOSIS — M79604 Pain in right leg: Secondary | ICD-10-CM

## 2020-10-19 DIAGNOSIS — I6521 Occlusion and stenosis of right carotid artery: Secondary | ICD-10-CM

## 2020-10-19 NOTE — Progress Notes (Signed)
Office Note     CC:  follow up Requesting Provider:  Mosie Lukes, MD  HPI: Emily Mitchell is a 75 y.o. (30-Jul-1946) female who presents for follow up of carotid artery stenosis. She has history of prior right carotid endarterectomy and developed occlusion. She has remained asymptomatic.  She has is being followed for her left ICA stenosis of 40 to 59%.  She currently denies any monocular blindness, lateralizing weakness, slurred speech, or facial drooping. She tells me she has macular degeneration so she sees her ophthalmologist regularly.  Over past several weeks she has started having a nagging pain behind right knee. She denies any injury or fall. This at time is uncomfortable to walk. she says last night it was very painful and she could not sleep. She has been taking Tylenol for the pain. The pain does not radiate up or down the leg. She feels there is more fullness around the right medial knee. Otherwise she is not having any swelling or color changes in the right leg. She denies any weakness or temperature changes  The pt is not on a statin for cholesterol management. Takes Zetia The pt is on a daily aspirin.   Other AC: Plavix The pt is on BB for hypertension.   The pt is not diabetic.  Tobacco hx: Former, quit 1996  Past Medical History:  Diagnosis Date  . Acid reflux   . Anemia 07/05/2014  . Anxiety and depression 12/14/2008   Qualifier: Diagnosis of  By: [name] LPN, Almyra Free    . Arthritis    "fingers, toes, knees, joints" (07/18/2018)  . Asthma   . Atypical chest pain 05/23/2015  . Back pain 02/02/2019  . Breast mass, left 04/28/2019  . CAD (coronary artery disease)    a. [place] with cath 06/2018 s/p DES to LAD.  Marland Kitchen CAD in native artery 07/19/2018  . Candidal skin infection 02/28/2018  . Carotid artery disease ([place]) 06/16/2015   S/p CEA   . Carotid artery occlusion    a. R carotid occluded, 96-04% LICA.  Marland Kitchen Carotid bruit 02/23/2020  . Chest pain 07/18/2018  . CHF (congestive  heart failure) ([place])   . Chicken pox as a child  . Chronic kidney disease    Bright's Disease at age 40   . Chronic obstructive pulmonary disease ([place]) 12/14/2008   Formatting of this note might be different from the original. Overview:  Spirometry 05/12/2013: FEV1 72% predicted FEV1 FVC ratio 73 predicted FEF 25 75  43% predicted  Last Assessment & Plan:  OK to stop taking advair Use albuterol as needed only Check oxygen levels during sleep - we will call you with results Call if symptoms worse  . Chronic rhinitis 06/14/2018  . CKD (chronic kidney disease), stage IV [facility])    Follows with [facility], Dr Alonza Smoker   . Congestive heart failure ([place]) 11/30/2015   Formatting of this note might be different from the original. Last Assessment & Plan:  No recent exacerbation  . Constipation 12/17/2008   Qualifier: Diagnosis of  By: Westly Pam.   . COPD (chronic obstructive pulmonary disease) ([place])   . COPD with asthma ([place]) 11/06/2017  . Essential hypertension 10/18/2015   Formatting of this note might be different from the original. Last Assessment & Plan:  poorlycontrolled and under a great deal of stress, add Metoprolol XL 25 mg daily. Encouraged heart healthy diet such as the DASH diet and exercise as tolerated.  . Essential tremor  11/05/2013  . Exudative age-related macular degeneration of right eye with active choroidal neovascularization ([place]) 08/26/2015  . Gastroesophageal reflux disease 12/14/2008   Formatting of this note might be different from the original. Overview:  Qualifier: Diagnosis of  By: [name] LPN, Almyra Free   Last Assessment & Plan:  Avoid offending foods, start probiotics. Do not eat large meals in late evening and consider raising head of bed.  Marland Kitchen GERD (gastroesophageal reflux disease)   . H. pylori infection 10/19/2013  . H/O multiple allergies 07/14/2018  . Helicobacter pylori (H. pylori) as the cause of diseases classified elsewhere 10/19/2013   Formatting of this  note might be different from the original. Last Assessment & Plan:  Tetracycline, Pepto Bismol, Flagyl and Omeprazole.  . Hepatitis 1970s   "don't know for sure which one it was; think it was A" (07/18/2018)  . History of food allergy 08/14/2018  . History of pulmonary embolism 07/17/2018  . Hoarseness 03/09/2019  . Hypercalcemia 03/09/2019  . Hyperglycemia 03/14/2016  . Hyperlipidemia   . Hyperlipidemia, mixed 05/26/2013   Crestor caused N/V Did not tolerate Lipitor   . Hypertension   . Hypothyroidism   . Knee pain, bilateral 12/17/2012  . Left hip pain 05/23/2015  . Low back pain 09/19/2015  . Lower urinary tract infectious disease 10/19/2013  . Lymphadenopathy 10/18/2017  . Macular degeneration of both eyes 12/16/2015   Right eye is wet Left Eye is dry Shots every 11 to 12 weeks in right eye at opthamologist office  . Medicare annual wellness visit, subsequent 02/21/2015  . Mixed anxiety depressive disorder 12/14/2008   Formatting of this note might be different from the original. Overview:  Qualifier: Diagnosis of  By: [name] LPN, Almyra Free  Patient had increased  Tremors on Bupropion  Last Assessment & Plan:  Is struggling with the stress of her 31 year old granddaughter whom she raised moving back with her own mother, the patient is estranged from her daughter so she is very upset. Increase Citalopram 40 mg daily   . Mumps 75 yrs old  . Neck pain 04/22/2017  . Numbness of finger 12/17/2012  . Occlusion and stenosis of carotid artery without mention of cerebral infarction 12/17/2012  . OSA (obstructive sleep apnea) 12/14/2008   PSG  06/2013-AHI 6/h 08/2011 (bethany) AHI 4/h No longer needs CPAP   . Pneumonia    "at least once" (07/18/2018)  . Preventative health care 03/14/2016  . Pulmonary emboli ([place]) 05/26/2013  . Renal insufficiency 07/17/2018  . Right shoulder pain 10/18/2017  . Sensation of foreign body in larynx 04/08/2019  . Sleep apnea    "don't use CPAP anymore; dr said I didn't have to"  (07/18/2018)  . Static tremor 11/05/2013   Formatting of this note might be different from the original. Last Assessment & Plan:  Follows with [facility]  . Statin intolerance 09/29/2019  . Stroke [facility])    "been told I've had some strokes; didn't know I'd had them" (07/18/2018)  . Tremor   . UTI (lower urinary tract infection) 10/19/2013  . Vitamin D deficiency 03/03/2018  . Vitreomacular adhesion of both eyes 01/07/2019   Formatting of this note might be different from the original. no traction    Past Surgical History:  Procedure Laterality Date  . APPENDECTOMY  1984  . CARDIAC CATHETERIZATION  ~ 2006  . CAROTID ANGIOGRAPHY Left 08/20/2018   Procedure: CAROTID ANGIOGRAPHY;  Surgeon: Serafina Mitchell, MD;  Location: [place] [facility];  Service: Cardiovascular;  Laterality: Left;  . CAROTID ENDARTERECTOMY Right 05/10/07   cea  . CATARACT EXTRACTION Left   . CATARACT EXTRACTION W/ INTRAOCULAR LENS  IMPLANT, BILATERAL Bilateral   . CORONARY ANGIOPLASTY WITH STENT PLACEMENT  07/18/2018  . CORONARY STENT INTERVENTION N/A 07/18/2018   Procedure: CORONARY STENT INTERVENTION;  Surgeon: Lorretta Harp, MD;  Location: [place] [facility];  Service: Cardiovascular;  Laterality: N/A;  . INTRAVASCULAR PRESSURE WIRE/FFR STUDY N/A 07/18/2018   Procedure: INTRAVASCULAR PRESSURE WIRE/FFR STUDY;  Surgeon: Lorretta Harp, MD;  Location: [place] [facility];  Service: Cardiovascular;  Laterality: N/A;  . JOINT REPLACEMENT    . LEFT HEART CATH AND CORONARY ANGIOGRAPHY N/A 07/18/2018   Procedure: LEFT HEART CATH AND CORONARY ANGIOGRAPHY;  Surgeon: Lorretta Harp, MD;  Location: [place] [facility];  Service: Cardiovascular;  Laterality: N/A;  . MULTIPLE TOOTH EXTRACTIONS    . TOTAL KNEE ARTHROPLASTY Bilateral 2005 - 2011   right - left  . VAGINAL HYSTERECTOMY  1984  . WISDOM TOOTH EXTRACTION      Social History   Socioeconomic History  . Marital status: Divorced    Spouse name: Not on file  .  Number of children: 2  . Years of education: Not on file  . Highest education level: Not on file  Occupational History  . Not on file  Tobacco Use  . Smoking status: Former Smoker    Packs/day: 2.00    Years: 30.00    Pack years: 60.00    Types: Cigarettes    Quit date: 12/03/1994    Years since quitting: 25.8  . Smokeless tobacco: Never Used  Vaping Use  . Vaping Use: Never used  Substance and Sexual Activity  . Alcohol use: Yes    Comment: 07/18/2018 "1 drink q 1-2 months; if that"  . Drug use: Never  . Sexual activity: Not Currently    Comment: lives with son and friend, no dietary restrictions  Other Topics Concern  . Not on file  Social History Narrative   Right handed   No caffeine   One story home   Social Determinants of Health   Financial Resource Strain: Not on file  Food Insecurity: Not on file  Transportation Needs: Not on file  Physical Activity: Not on file  Stress: Not on file  Social Connections: Not on file  Intimate Partner Violence: Not on file    Family History  Problem Relation Age of Onset  . Stroke Mother        mini stroke  . Kidney disease Mother   . Heart failure Mother   . Hypertension Mother   . Diabetes Sister        type 2  . Kidney disease Sister   . Allergic rhinitis Sister   . Heart attack Maternal Grandfather   . Hyperlipidemia Sister   . Colon cancer Neg Hx     Current Outpatient Medications  Medication Sig Dispense Refill  . acetaminophen (TYLENOL) 500 MG tablet Take 500 mg by mouth as needed.    Marland Kitchen albuterol (VENTOLIN HFA) 108 (90 Base) MCG/ACT inhaler Inhale 2 puffs into the lungs every 6 (six) hours as needed for wheezing or shortness of breath. 18 g 3  . aMILoride (MIDAMOR) 5 MG tablet Take 5 mg by mouth daily.    Marland Kitchen amLODipine (NORVASC) 2.5 MG tablet Take 1 tablet (2.5 mg total) by mouth daily. 90 tablet 1  . aspirin EC 81 MG tablet Take 1 tablet (81 mg  total) by mouth daily. 90 tablet 3  . atorvastatin (LIPITOR) 10  MG tablet TAKE ONE TABLET BY MOUTH DAILY 90 tablet 2  . BREZTRI AEROSPHERE 160-9-4.8 MCG/ACT AERO INHALE TWO PUFFS BY MOUTH TWICE A DAY IN THE MORNING AND IN THE EVENING 32.1 g 1  . Calcium Citrate-Vitamin D 200-250 MG-UNIT TABS Take 1 tablet by mouth daily.    . carvedilol (COREG) 6.25 MG tablet Take 1 tablet (6.25 mg total) by mouth 2 (two) times daily with a meal. 180 tablet 1  . Cholecalciferol 25 MCG (1000 UT) tablet Take 1,000 Units by mouth daily.    . Choline Fenofibrate (FENOFIBRIC ACID) 135 MG CPDR Take 1 capsule by mouth daily. 90 capsule 1  . clonazePAM (KLONOPIN) 1 MG tablet TAKE ONE TABLET BY MOUTH THREE TIMES A DAY AS NEEDED FOR ANXIETY 90 tablet 1  . clopidogrel (PLAVIX) 75 MG tablet Take 1 tablet (75 mg total) by mouth daily. 90 tablet 3  . cyanocobalamin 100 MCG tablet Take 500 mcg by mouth daily.    Marland Kitchen ezetimibe (ZETIA) 10 MG tablet Take 1 tablet (10 mg total) by mouth daily. 90 tablet 1  . famotidine (PEPCID) 40 MG tablet Take 1 tablet (40 mg total) by mouth daily. 90 tablet 1  . fenofibrate micronized (LOFIBRA) 134 MG capsule Take 134 mg by mouth daily.    Marland Kitchen FLUoxetine (PROZAC) 20 MG tablet Take 1 tablet (20 mg total) by mouth daily. 90 tablet 1  . gabapentin (NEURONTIN) 100 MG capsule Take 2 capsules (200 mg total) by mouth at bedtime. 180 capsule 1  . levothyroxine (SYNTHROID) 112 MCG tablet Take 1 tablet (112 mcg total) by mouth daily. 90 tablet 1  . montelukast (SINGULAIR) 10 MG tablet Take 1 tablet (10 mg total) by mouth at bedtime as needed. 90 tablet 3  . Multiple Vitamins-Minerals (PRESERVISION AREDS 2 PO) Take 1 capsule by mouth daily.    . Omega-3 Fatty Acids (FISH OIL PO) Take 1 capsule by mouth daily.    . Probiotic Product (VSL#3) CAPS Take 1 capsule by mouth daily.    . Calcium Citrate-Vitamin D (CALCIUM + D PO) Take 1 tablet by mouth daily. (Patient not taking: Reported on 10/19/2020)    . esomeprazole (NEXIUM) 40 MG capsule Take 40 mg by mouth daily. (Patient  not taking: Reported on 10/19/2020)    . Lactobacillus (DIGESTIVE HEALTH PROBIOTIC PO) Take 1 capsule by mouth daily. (Patient not taking: Reported on 10/19/2020)     No current facility-administered medications for this visit.    Allergies  Allergen Reactions  . Niaspan [Niacin Er] Nausea And Vomiting and Swelling    Swelling in mouth  . Pantoprazole     Mouth sores  . Bupropion Other (See Comments)    Uncontrollable shakes  . Nitroglycerin     NOT allergic - cardiology has recommended she NOT use this due to h/o severe carotid disease as it may decrease cerebral perfusion  . Penicillins Hives    DID THE REACTION INVOLVE: Swelling of the face/tongue/throat, SOB, or low BP? Yes Sudden or severe rash/hives, skin peeling, or the inside of the mouth or nose? Unknown Did it require medical treatment? Unknown When did it last happen?[age]  If all above answers are "NO", may proceed with cephalosporin use.  . Statins   . Sulfonamide Derivatives Hives  . Apple Rash  . Banana Rash     REVIEW OF SYSTEMS:  [X]  denotes positive finding, [ ]  denotes negative finding Cardiac  Comments:  Chest pain or chest pressure:    Shortness of breath upon exertion:    Short of breath when lying flat:    Irregular heart rhythm:        Vascular    Pain in calf, thigh, or hip brought on by ambulation:    Pain in feet at night that wakes you up from your sleep:     Blood clot in your veins:    Leg swelling:         Pulmonary    Oxygen at home:    Productive cough:     Wheezing:         Neurologic    Sudden weakness in arms or legs:     Sudden numbness in arms or legs:     Sudden onset of difficulty speaking or slurred speech:    Temporary loss of vision in one eye:     Problems with dizziness:         Gastrointestinal    Blood in stool:     Vomited blood:         Genitourinary    Burning when urinating:     Blood in urine:        Psychiatric    Major depression:          Hematologic    Bleeding problems:    Problems with blood clotting too easily:        Skin    Rashes or ulcers:        Constitutional    Fever or chills:      PHYSICAL EXAMINATION:  Vitals:   10/19/20 1355 10/19/20 1402  BP: 120/67 126/62  Pulse: (!) 59   Resp: 20   Temp: 98.7 F (37.1 C)   TempSrc: Temporal   SpO2: 99%   Weight: 182 lb 14.4 oz (83 kg)   Height: 5\' 3"  (1.6 m)     General:  WDWN in NAD; vital signs documented above Gait: Normal HENT: WNL, normocephalic Pulmonary: normal non-labored breathing , without wheezing Cardiac: regular HR, without  Murmurs without carotid bruit Abdomen: soft, NT, no masses Vascular Exam/Pulses:  Right Left  Radial 2+ (normal) 2+ (normal)  Femoral 2+ (normal) 2+ (normal)  Popliteal 2+ (normal) 2+ (normal)  DP 2+ (normal) 2+ (normal)  PT 2+ (normal) 2+ (normal)   Extremities: without ischemic changes, without Gangrene , without cellulitis; without open wounds;  Musculoskeletal: no muscle wasting or atrophy  Neurologic: A&O X 3;  No focal weakness or paresthesias are detected Psychiatric:  The pt has Normal affect.   Non-Invasive Vascular Imaging:   VAS US Carotid Duplex Bilateral: 10/19/20 Summary:  Right Carotid: Evidence consistent with a total occlusion of the right ICA.   Left Carotid: Velocities in the left ICA are consistent with a 40-59% stenosis.   Vertebrals: Bilateral vertebral arteries demonstrate antegrade flow.  Subclavians: Normal flow hemodynamics were seen in bilateral subclavian arteries.    ASSESSMENT/PLAN:: 75 y.o. female here for follow up for carotid artery stenosis. History of right ICA occlusion. She remains asymptomatic. Her duplex today shows stable left ICA stenosis of 40-59%. She will continue her Plavix, Aspirin and statin. I reviewed with her signs and symptoms of TIA and Stroke and she understands should this occur she should go to the ER Her lower extremities are well perfused with  palpable pulses. I do not think that she has a DVT in her right lower extremity. I advised her that if her symptoms  do not improve she should follow up with her PCP for further evaluation - She will follow up with [place] in 1 year with repeat carotid duplex   Karoline Caldwell, PA-C [facility] [phone]  Clinic MD:   Roxanne Mins

## 2020-10-22 ENCOUNTER — Other Ambulatory Visit: Payer: Self-pay | Admitting: Family Medicine

## 2020-10-22 ENCOUNTER — Other Ambulatory Visit: Payer: Self-pay | Admitting: Pulmonary Disease

## 2020-10-22 DIAGNOSIS — E785 Hyperlipidemia, unspecified: Secondary | ICD-10-CM

## 2020-10-24 ENCOUNTER — Other Ambulatory Visit: Payer: Self-pay | Admitting: Family Medicine

## 2020-10-25 NOTE — Telephone Encounter (Signed)
Requesting: clonazepam Contract: 12/30/19 UDS: 12/30/19 Last Visit: 06/01/20 Next Visit: 11/04/20 Last Refill: 05/02/20  Please Advise

## 2020-11-04 ENCOUNTER — Encounter: Payer: Medicare Other | Admitting: Family Medicine

## 2020-11-04 ENCOUNTER — Telehealth: Payer: Self-pay | Admitting: Family Medicine

## 2020-11-04 NOTE — Telephone Encounter (Signed)
Prior auth started for the fluoxetine 20mg  tabs.  If this fails insurance will pay for the capsules.    Key: [id number]

## 2020-11-04 NOTE — Telephone Encounter (Signed)
Pt dropped off letter for provider to see, it is a letter with instructions about her temporary med for Fluoxetin tab- pt stated need this to be done ASAP. Please verify and process for pt. Document put at front office tray under providers name.

## 2020-11-05 ENCOUNTER — Telehealth: Payer: Self-pay | Admitting: Family Medicine

## 2020-11-05 MED ORDER — FAMOTIDINE 40 MG PO TABS
40.0000 mg | ORAL_TABLET | Freq: Every day | ORAL | 1 refills | Status: DC
Start: 2020-11-05 — End: 2021-03-29

## 2020-11-05 NOTE — Telephone Encounter (Signed)
Prescription sent in and patient notified. 

## 2020-11-05 NOTE — Telephone Encounter (Addendum)
Request Reference Number: [id number]. FLUOXETINE TAB 20MG  is approved through 07/30/2021.  Patient notified of approval.  Pharmacy gave her only 30tabs.  She has one refill of 90 left.  So she will have pharmacy call for refill after that to get back on track.

## 2020-11-05 NOTE — Telephone Encounter (Signed)
Medication: famotidine (PEPCID) 40 MG tablet [[id number]]       Has the patient contacted their pharmacy?  (If no, request that the patient contact the pharmacy for the refill.) (If yes, when and what did the pharmacy advise?)     Preferred Pharmacy (with phone number or street name): [name] [place], [place] - [address]  [place], [place] [address]  Phone:  [phone] Fax:  [phone]      Agent: Please be advised that RX refills may take up to 3 business days. We ask that you follow-up with your pharmacy.

## 2020-11-10 ENCOUNTER — Ambulatory Visit (INDEPENDENT_AMBULATORY_CARE_PROVIDER_SITE_OTHER): Payer: Medicare Other | Admitting: Medical

## 2020-11-10 ENCOUNTER — Other Ambulatory Visit: Payer: Self-pay

## 2020-11-10 VITALS — BP 124/53 | HR 74 | Resp 18 | Ht 63.0 in | Wt 181.2 lb

## 2020-11-10 DIAGNOSIS — J449 Chronic obstructive pulmonary disease, unspecified: Secondary | ICD-10-CM

## 2020-11-10 DIAGNOSIS — I10 Essential (primary) hypertension: Secondary | ICD-10-CM | POA: Diagnosis not present

## 2020-11-10 DIAGNOSIS — K219 Gastro-esophageal reflux disease without esophagitis: Secondary | ICD-10-CM

## 2020-11-10 DIAGNOSIS — E782 Mixed hyperlipidemia: Secondary | ICD-10-CM | POA: Diagnosis not present

## 2020-11-10 DIAGNOSIS — R739 Hyperglycemia, unspecified: Secondary | ICD-10-CM

## 2020-11-10 DIAGNOSIS — E038 Other specified hypothyroidism: Secondary | ICD-10-CM

## 2020-11-10 DIAGNOSIS — N184 Chronic kidney disease, stage 4 (severe): Secondary | ICD-10-CM

## 2020-11-10 NOTE — Patient Instructions (Addendum)
For hx of htn continue amlodipine and coreg. Bp well controlled.  For high cholesterol continue atorvastatin and zetia. Will get cmp and lipid panel today.  For gerd continue pepcid.  For low thyroid continue levothyroxine.  Elevated sugars in past. Will get a1c.  Copd continue inhalers.   For depression and anxiety continue prozac and clonzepam.  Follow up as regularly scheduled with pcp or as needed

## 2020-11-10 NOTE — Progress Notes (Signed)
Subjective:    Patient ID: Emily Mitchell, female    DOB: 1946/01/19, 75 y.o.   MRN: [id number]  HPI Pt in for follow up.   Pt has copd. Pt is on inhaler prescribed by pulmonologist.  Pt has htn. On amlodipine and coreg 6.25 mg twice a day bp is well controlled.  Pt on atorvastatin 10 mg daily and zetia.  Gerd controlled with pepcid.  Low thyroid hx. On levothyroxine.  Pt states allergies relatively controlled. On montelukast.  Pt has anxiety and depression. Controlled with prozac and clonazapam refilled recently.   Review of Systems  Constitutional: Negative for chills, fatigue and fever.  Respiratory: Negative for cough, chest tightness, shortness of breath and wheezing.   Cardiovascular: Negative for chest pain and palpitations.  Gastrointestinal: Negative for abdominal pain and diarrhea.  Musculoskeletal: Negative for back pain and myalgias.  Skin: Negative for rash and wound.  Neurological: Negative for dizziness, seizures, weakness and headaches.  Hematological: Negative for adenopathy. Does not bruise/bleed easily.  Psychiatric/Behavioral: Negative for behavioral problems and dysphoric mood. The patient is nervous/anxious.     Past Medical History:  Diagnosis Date  . Acid reflux   . Anemia 07/05/2014  . Anxiety and depression 12/14/2008   Qualifier: Diagnosis of  By: [name] LPN, Almyra Free    . Arthritis    "fingers, toes, knees, joints" (07/18/2018)  . Asthma   . Atypical chest pain 05/23/2015  . Back pain 02/02/2019  . Breast mass, left 04/28/2019  . CAD (coronary artery disease)    a. [place] with cath 06/2018 s/p DES to LAD.  Marland Kitchen CAD in native artery 07/19/2018  . Candidal skin infection 02/28/2018  . Carotid artery disease ([place]) 06/16/2015   S/p CEA   . Carotid artery occlusion    a. R carotid occluded, 38-18% LICA.  Marland Kitchen Carotid bruit 02/23/2020  . Chest pain 07/18/2018  . CHF (congestive heart failure) ([place])   . Chicken pox as a child  . 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 Chronic obstructive pulmonary disease ([place]) 12/14/2008   Formatting of this note might be different from the original. Overview:  Spirometry 05/12/2013: FEV1 72% predicted FEV1 FVC ratio 73 predicted FEF 25 75  43% predicted  Last Assessment & Plan:  OK to stop taking advair Use albuterol as needed only Check oxygen levels during sleep - we will call you with results Call if symptoms worse  . Chronic rhinitis 06/14/2018  . CKD (chronic kidney disease), stage IV [facility])    Follows with [facility], Dr Alonza Smoker   . Congestive heart failure ([place]) 11/30/2015   Formatting of this note might be different from the original. Last Assessment & Plan:  No recent exacerbation  . Constipation 12/17/2008   Qualifier: Diagnosis of  By: [place] Pam.   . COPD (chronic obstructive pulmonary disease) ([place])   . COPD with asthma ([place]) 11/06/2017  . Essential hypertension 10/18/2015   Formatting of this note might be different from the original. Last Assessment & Plan:  poorlycontrolled and under a great deal of stress, add Metoprolol XL 25 mg daily. Encouraged heart healthy diet such as the DASH diet and exercise as tolerated.  . Essential tremor 11/05/2013  . Exudative age-related macular degeneration of right eye with active choroidal neovascularization ([place]) 08/26/2015  . Gastroesophageal reflux disease 12/14/2008   Formatting of this note might be different from the original. Overview:  Qualifier: Diagnosis of  By: [name] LPN, Almyra Free   Last Assessment & Plan:  Avoid offending foods, start probiotics. Do not eat large meals in late evening and consider raising head of bed.  Marland Kitchen GERD (gastroesophageal reflux disease)   . H. pylori infection 10/19/2013  . H/O multiple allergies 07/14/2018  . Helicobacter pylori (H. pylori) as the cause of diseases classified elsewhere 10/19/2013   Formatting of this note might be different from the original. Last Assessment & Plan:  Tetracycline,  Pepto Bismol, Flagyl and Omeprazole.  . Hepatitis 1970s   "don't know for sure which one it was; think it was A" (07/18/2018)  . History of food allergy 08/14/2018  . History of pulmonary embolism 07/17/2018  . Hoarseness 03/09/2019  . Hypercalcemia 03/09/2019  . Hyperglycemia 03/14/2016  . Hyperlipidemia   . Hyperlipidemia, mixed 05/26/2013   Crestor caused N/V Did not tolerate Lipitor   . Hypertension   . Hypothyroidism   . Knee pain, bilateral 12/17/2012  . Left hip pain 05/23/2015  . Low back pain 09/19/2015  . Lower urinary tract infectious disease 10/19/2013  . Lymphadenopathy 10/18/2017  . Macular degeneration of both eyes 12/16/2015   Right eye is wet Left Eye is dry Shots every 11 to 12 weeks in right eye at opthamologist office  . Medicare annual wellness visit, subsequent 02/21/2015  . Mixed anxiety depressive disorder 12/14/2008   Formatting of this note might be different from the original. Overview:  Qualifier: Diagnosis of  By: [name] LPN, Almyra Free  Patient had increased  Tremors on Bupropion  Last Assessment & Plan:  Is struggling with the stress of her 34 year old granddaughter whom she raised moving back with her own mother, the patient is estranged from her daughter so she is very upset. Increase Citalopram 40 mg daily   . Mumps 75 yrs old  . Neck pain 04/22/2017  . Numbness of finger 12/17/2012  . Occlusion and stenosis of carotid artery without mention of cerebral infarction 12/17/2012  . OSA (obstructive sleep apnea) 12/14/2008   PSG  06/2013-AHI 6/h 08/2011 (bethany) AHI 4/h No longer needs CPAP   . Pneumonia    "at least once" (07/18/2018)  . Preventative health care 03/14/2016  . Pulmonary emboli ([place]) 05/26/2013  . Renal insufficiency 07/17/2018  . Right shoulder pain 10/18/2017  . Sensation of foreign body in larynx 04/08/2019  . Sleep apnea    "don't use CPAP anymore; dr said I didn't have to" (07/18/2018)  . Static tremor 11/05/2013   Formatting of this note might be  different from the original. Last Assessment & Plan:  Follows with [facility]  . Statin intolerance 09/29/2019  . Stroke [facility])    "been told I've had some strokes; didn't know I'd had them" (07/18/2018)  . Tremor   . UTI (lower urinary tract infection) 10/19/2013  . Vitamin D deficiency 03/03/2018  . Vitreomacular adhesion of both eyes 01/07/2019   Formatting of this note might be different from the original. no traction     Social History   Socioeconomic History  . Marital status: Divorced    Spouse name: Not on file  . Number of children: 2  . Years of education: Not on file  . Highest education level: Not on file  Occupational History  . Not on file  Tobacco Use  . Smoking status: Former Smoker    Packs/day: 2.00    Years: 30.00    Pack years: 60.00    Types: Cigarettes    Quit date:  12/03/1994    Years since quitting: 25.9  . Smokeless tobacco: Never Used  Vaping Use  . Vaping Use: Never used  Substance and Sexual Activity  . Alcohol use: Yes    Comment: 07/18/2018 "1 drink q 1-2 months; if that"  . Drug use: Never  . Sexual activity: Not Currently    Comment: lives with son and friend, no dietary restrictions  Other Topics Concern  . Not on file  Social History Narrative   Right handed   No caffeine   One story home   Social Determinants of Health   Financial Resource Strain: Not on file  Food Insecurity: Not on file  Transportation Needs: Not on file  Physical Activity: Not on file  Stress: Not on file  Social Connections: Not on file  Intimate Partner Violence: Not on file    Past Surgical History:  Procedure Laterality Date  . APPENDECTOMY  1984  . CARDIAC CATHETERIZATION  ~ 2006  . CAROTID ANGIOGRAPHY Left 08/20/2018   Procedure: CAROTID ANGIOGRAPHY;  Surgeon: Serafina Mitchell, MD;  Location: [place] [facility];  Service: Cardiovascular;  Laterality: Left;  . CAROTID ENDARTERECTOMY Right 05/10/07   cea  . CATARACT EXTRACTION Left   . CATARACT  EXTRACTION W/ INTRAOCULAR LENS  IMPLANT, BILATERAL Bilateral   . CORONARY ANGIOPLASTY WITH STENT PLACEMENT  07/18/2018  . CORONARY STENT INTERVENTION N/A 07/18/2018   Procedure: CORONARY STENT INTERVENTION;  Surgeon: Lorretta Harp, MD;  Location: [place] [facility];  Service: Cardiovascular;  Laterality: N/A;  . INTRAVASCULAR PRESSURE WIRE/FFR STUDY N/A 07/18/2018   Procedure: INTRAVASCULAR PRESSURE WIRE/FFR STUDY;  Surgeon: Lorretta Harp, MD;  Location: [place] [facility];  Service: Cardiovascular;  Laterality: N/A;  . JOINT REPLACEMENT    . LEFT HEART CATH AND CORONARY ANGIOGRAPHY N/A 07/18/2018   Procedure: LEFT HEART CATH AND CORONARY ANGIOGRAPHY;  Surgeon: Lorretta Harp, MD;  Location: [place] [facility];  Service: Cardiovascular;  Laterality: N/A;  . MULTIPLE TOOTH EXTRACTIONS    . TOTAL KNEE ARTHROPLASTY Bilateral 2005 - 2011   right - left  . VAGINAL HYSTERECTOMY  1984  . WISDOM TOOTH EXTRACTION      Family History  Problem Relation Age of Onset  . Stroke Mother        mini stroke  . Kidney disease Mother   . Heart failure Mother   . Hypertension Mother   . Diabetes Sister        type 2  . Kidney disease Sister   . Allergic rhinitis Sister   . Heart attack Maternal Grandfather   . Hyperlipidemia Sister   . Colon cancer Neg Hx     Allergies  Allergen Reactions  . Niaspan [Niacin Er] Nausea And Vomiting and Swelling    Swelling in mouth  . Pantoprazole     Mouth sores  . Bupropion Other (See Comments)    Uncontrollable shakes  . Nitroglycerin     NOT allergic - cardiology has recommended she NOT use this due to h/o severe carotid disease as it may decrease cerebral perfusion  . Penicillins Hives    DID THE REACTION INVOLVE: Swelling of the face/tongue/throat, SOB, or low BP? Yes Sudden or severe rash/hives, skin peeling, or the inside of the mouth or nose? Unknown Did it require medical treatment? Unknown When did it last happen?[age]  If all above  answers are "NO", may proceed with cephalosporin use.  . Statins   . Sulfonamide Derivatives Hives  .  Apple Rash  . Banana Rash    Current Outpatient Medications on File Prior to Visit  Medication Sig Dispense Refill  . acetaminophen (TYLENOL) 500 MG tablet Take 500 mg by mouth as needed.    Marland Kitchen albuterol (VENTOLIN HFA) 108 (90 Base) MCG/ACT inhaler Inhale 2 puffs into the lungs every 6 (six) hours as needed for wheezing or shortness of breath. 18 g 3  . aMILoride (MIDAMOR) 5 MG tablet Take 5 mg by mouth daily.    Marland Kitchen amLODipine (NORVASC) 2.5 MG tablet Take 1 tablet (2.5 mg total) by mouth daily. 90 tablet 1  . aspirin EC 81 MG tablet Take 1 tablet (81 mg total) by mouth daily. 90 tablet 3  . atorvastatin (LIPITOR) 10 MG tablet TAKE ONE TABLET BY MOUTH DAILY 90 tablet 2  . BREZTRI AEROSPHERE 160-9-4.8 MCG/ACT AERO INHALE TWO PUFFS BY MOUTH TWICE A DAY - NEED OFFICE VISIT FOR FUTURE REFILLS 32.1 g 0  . Calcium Citrate-Vitamin D (CALCIUM + D PO) Take 1 tablet by mouth daily.    . Calcium Citrate-Vitamin D 200-250 MG-UNIT TABS Take 1 tablet by mouth daily.    . carvedilol (COREG) 6.25 MG tablet Take 1 tablet (6.25 mg total) by mouth 2 (two) times daily with a meal. 180 tablet 1  . Cholecalciferol 25 MCG (1000 UT) tablet Take 1,000 Units by mouth daily.    . Choline Fenofibrate (FENOFIBRIC ACID) 135 MG CPDR Take 1 capsule by mouth daily. 90 capsule 1  . clonazePAM (KLONOPIN) 1 MG tablet TAKE ONE TABLET BY MOUTH THREE TIMES A DAY AS NEEDED FOR ANXIETY 90 tablet 1  . clopidogrel (PLAVIX) 75 MG tablet Take 1 tablet (75 mg total) by mouth daily. 90 tablet 3  . cyanocobalamin 100 MCG tablet Take 500 mcg by mouth daily.    Marland Kitchen ezetimibe (ZETIA) 10 MG tablet TAKE ONE TABLET BY MOUTH DAILY 90 tablet 1  . famotidine (PEPCID) 40 MG tablet Take 1 tablet (40 mg total) by mouth daily. 90 tablet 1  . fenofibrate micronized (LOFIBRA) 134 MG capsule Take 134 mg by mouth daily.    Marland Kitchen FLUoxetine (PROZAC) 20 MG tablet  TAKE ONE TABLET BY MOUTH DAILY 90 tablet 1  . gabapentin (NEURONTIN) 100 MG capsule Take 2 capsules (200 mg total) by mouth at bedtime. 180 capsule 1  . levothyroxine (SYNTHROID) 112 MCG tablet TAKE ONE TABLET BY MOUTH DAILY 90 tablet 1  . montelukast (SINGULAIR) 10 MG tablet Take 1 tablet (10 mg total) by mouth at bedtime as needed. 90 tablet 3  . Multiple Vitamins-Minerals (PRESERVISION AREDS 2 PO) Take 1 capsule by mouth daily.    . Omega-3 Fatty Acids (FISH OIL PO) Take 1 capsule by mouth daily.    . Probiotic Product (VSL#3) CAPS Take 1 capsule by mouth daily.     No current facility-administered medications on file prior to visit.    BP (!) 124/53   Pulse 74   Resp 18   Ht 5\' 3"  (1.6 m)   Wt 181 lb 3.2 oz (82.2 kg)   SpO2 96%   BMI 32.10 kg/m      Objective:   Physical Exam  General Mental Status- Alert. General Appearance- Not in acute distress.   Skin General: Color- Normal Color. Moisture- Normal Moisture.  Neck Carotid Arteries- Normal color. Moisture- Normal Moisture. No carotid bruits. No JVD.  Chest and Lung Exam Auscultation: Breath Sounds:-Normal.  Cardiovascular Auscultation:Rythm- Regular. Murmurs & Other Heart Sounds:Auscultation of the heart reveals- No  Murmurs.  Abdomen Inspection:-Inspeection Normal. Palpation/Percussion:Note:No mass. Palpation and Percussion of the abdomen reveal- Non Tender, Non Distended + BS, no rebound or guarding.   Neurologic Cranial Nerve exam:- CN III-XII intact(No nystagmus), symmetric smile. Strength:- 5/5 equal and symmetric strength both upper and lower extremities.      Assessment & Plan:  For hx of htn continue amlodipine and coreg. Bp well controlled.  For high cholesterol continue atorvastatin and zetia. Will get cmp and lipid panel today.  For gerd continue pepcid.  For low thyroid continue levothyroxine.  Elevated sugars in past. Will get a1c.  Copd continue inhalers.   For depression and anxiety  continue prozac and clonzepam.  Follow up as regularly scheduled with pcp or as needed  Mackie Pai, PA-C

## 2020-11-11 ENCOUNTER — Telehealth: Payer: Self-pay | Admitting: Medical

## 2020-11-11 DIAGNOSIS — R7989 Other specified abnormal findings of blood chemistry: Secondary | ICD-10-CM

## 2020-11-11 LAB — COMPREHENSIVE METABOLIC PANEL
ALT: 12 U/L (ref 0–35)
AST: 16 U/L (ref 0–37)
Albumin: 3.7 g/dL (ref 3.5–5.2)
Alkaline Phosphatase: 44 U/L (ref 39–117)
BUN: 31 mg/dL — ABNORMAL HIGH (ref 6–23)
CO2: 31 mEq/L (ref 19–32)
Calcium: 9.6 mg/dL (ref 8.4–10.5)
Chloride: 106 mEq/L (ref 96–112)
Creatinine, Ser: 1.84 mg/dL — ABNORMAL HIGH (ref 0.40–1.20)
GFR: 27.04 mL/min — ABNORMAL LOW (ref >60.00)
Glucose, Bld: 74 mg/dL (ref 70–99)
Potassium: 4.4 mEq/L (ref 3.5–5.1)
Sodium: 143 mEq/L (ref 135–145)
Total Bilirubin: 0.5 mg/dL (ref 0.2–1.2)
Total Protein: 6.1 g/dL (ref 6.0–8.3)

## 2020-11-11 LAB — TSH: TSH: 0.29 u[IU]/mL — ABNORMAL LOW (ref 0.35–4.50)

## 2020-11-11 LAB — LIPID PANEL
Cholesterol: 116 mg/dL (ref 0–200)
HDL: 50.1 mg/dL (ref >39.00)
LDL Cholesterol: 43 mg/dL (ref 0–99)
NonHDL: 65.9
Total CHOL/HDL Ratio: 2
Triglycerides: 114 mg/dL (ref 0.0–149.0)
VLDL: 22.8 mg/dL (ref 0.0–40.0)

## 2020-11-11 LAB — HEMOGLOBIN A1C: Hgb A1c MFr Bld: 5.3 % (ref 4.6–6.5)

## 2020-11-11 NOTE — Telephone Encounter (Signed)
Future tsh and t4 placed. 

## 2020-11-15 DIAGNOSIS — N189 Chronic kidney disease, unspecified: Secondary | ICD-10-CM | POA: Diagnosis not present

## 2020-11-15 DIAGNOSIS — N1832 Chronic kidney disease, stage 3b: Secondary | ICD-10-CM | POA: Diagnosis not present

## 2020-11-18 ENCOUNTER — Other Ambulatory Visit: Payer: Self-pay

## 2020-11-18 ENCOUNTER — Other Ambulatory Visit (INDEPENDENT_AMBULATORY_CARE_PROVIDER_SITE_OTHER): Payer: Medicare Other

## 2020-11-18 DIAGNOSIS — R7989 Other specified abnormal findings of blood chemistry: Secondary | ICD-10-CM

## 2020-11-19 ENCOUNTER — Telehealth: Payer: Self-pay

## 2020-11-19 LAB — T4, FREE: Free T4: 1.01 ng/dL (ref 0.60–1.60)

## 2020-11-19 LAB — TSH: TSH: 0.58 u[IU]/mL (ref 0.35–4.50)

## 2020-11-19 NOTE — Telephone Encounter (Signed)
Pt called in wanting to know results for labs from yesterday

## 2020-11-19 NOTE — Telephone Encounter (Signed)
Pt is aware.  

## 2020-11-19 NOTE — Telephone Encounter (Signed)
Thyroid studies now normal. No changes needed

## 2020-12-02 ENCOUNTER — Encounter: Payer: Self-pay | Admitting: *Deleted

## 2021-01-05 DIAGNOSIS — Z961 Presence of intraocular lens: Secondary | ICD-10-CM | POA: Diagnosis not present

## 2021-01-05 DIAGNOSIS — H353212 Exudative age-related macular degeneration, right eye, with inactive choroidal neovascularization: Secondary | ICD-10-CM | POA: Diagnosis not present

## 2021-01-05 DIAGNOSIS — H35342 Macular cyst, hole, or pseudohole, left eye: Secondary | ICD-10-CM | POA: Diagnosis not present

## 2021-01-05 DIAGNOSIS — H3554 Dystrophies primarily involving the retinal pigment epithelium: Secondary | ICD-10-CM | POA: Diagnosis not present

## 2021-01-05 DIAGNOSIS — H353122 Nonexudative age-related macular degeneration, left eye, intermediate dry stage: Secondary | ICD-10-CM | POA: Diagnosis not present

## 2021-01-05 DIAGNOSIS — H43823 Vitreomacular adhesion, bilateral: Secondary | ICD-10-CM | POA: Diagnosis not present

## 2021-01-05 DIAGNOSIS — H35362 Drusen (degenerative) of macula, left eye: Secondary | ICD-10-CM | POA: Diagnosis not present

## 2021-01-09 ENCOUNTER — Encounter: Payer: Self-pay | Admitting: Family Medicine

## 2021-01-10 ENCOUNTER — Other Ambulatory Visit: Payer: Self-pay | Admitting: Family Medicine

## 2021-01-10 MED ORDER — TRAMADOL HCL 50 MG PO TABS: 50.0000 mg | ORAL_TABLET | Freq: Three times a day (TID) | ORAL | 0 refills | Status: DC | PRN

## 2021-01-17 DIAGNOSIS — N189 Chronic kidney disease, unspecified: Secondary | ICD-10-CM | POA: Diagnosis not present

## 2021-01-17 DIAGNOSIS — N1832 Chronic kidney disease, stage 3b: Secondary | ICD-10-CM | POA: Diagnosis not present

## 2021-01-19 ENCOUNTER — Other Ambulatory Visit: Payer: Self-pay | Admitting: Pulmonary Disease

## 2021-01-19 ENCOUNTER — Other Ambulatory Visit: Payer: Self-pay | Admitting: Cardiology

## 2021-01-19 ENCOUNTER — Telehealth: Payer: Self-pay | Admitting: Family Medicine

## 2021-01-19 ENCOUNTER — Other Ambulatory Visit: Payer: Self-pay | Admitting: Family Medicine

## 2021-01-19 MED ORDER — AMLODIPINE BESYLATE 2.5 MG PO TABS
2.5000 mg | ORAL_TABLET | Freq: Every day | ORAL | 1 refills | Status: DC
Start: 2021-01-19 — End: 2021-06-13

## 2021-01-19 NOTE — Telephone Encounter (Signed)
Only one needing refills was amlodipine.  Breztri, pt needs to call pulmonology.  Advised patient next time to call pharmacy first.

## 2021-01-19 NOTE — Telephone Encounter (Signed)
Medication: BREZTRI AEROSPHERE 160-9-4.8 MCG/ACT AERO [[id number]]   amLODipine (NORVASC) 2.5 MG tablet [[id number]  ezetimibe (ZETIA) 10 MG tablet [[id number]]   FLUoxetine (PROZAC) 20 MG tablet [[id number]]   montelukast (SINGULAIR) 10 MG tablet [[id number]]   levothyroxine (SYNTHROID) 112 MCG tablet [[id number]]   famotidine (PEPCID) 40 MG tablet [[id number]]   clopidogrel (PLAVIX) 75 MG tablet [[id number]]   Choline Fenofibrate (FENOFIBRIC ACID) 135 MG CPDR [[id number]]  no (If no, request that the patient contact the pharmacy for the refill.) (If yes, when and what did the pharmacy advise?)    Preferred Pharmacy (with phone number or street name): [name] [id number] [place], [place] - [address]  [address] [place], [place] [address]  Phone:  [phone]  Fax:  [phone]    Agent: Please be advised that RX refills may take up to 3 business days. We ask that you follow-up with your pharmacy.

## 2021-02-14 ENCOUNTER — Telehealth: Payer: Self-pay

## 2021-02-14 NOTE — Telephone Encounter (Signed)
I have called the pt back and relayed the message via VM, but to give [place] a call back if he still had additional questions.

## 2021-02-14 NOTE — Telephone Encounter (Signed)
Pt's son Rodman Key has called the office to let us know that his moms bp has been dropping within the last week. She is complaining of dizziness, especially while standing up. Todays BP reading is 78/56 and she is currently dizzy.  Pt son wants to know if they should get off of the Amlodipine 2.5mg  for that is the only BP med she is taking according to [facility].   I have given them a sooner appointment and will come in on 02/17/21 @ 2pm.

## 2021-02-15 ENCOUNTER — Other Ambulatory Visit: Payer: Self-pay | Admitting: Pulmonary Disease

## 2021-02-15 ENCOUNTER — Emergency Department (HOSPITAL_BASED_OUTPATIENT_CLINIC_OR_DEPARTMENT_OTHER): Payer: Medicare Other

## 2021-02-15 ENCOUNTER — Other Ambulatory Visit: Payer: Self-pay

## 2021-02-15 ENCOUNTER — Other Ambulatory Visit (HOSPITAL_BASED_OUTPATIENT_CLINIC_OR_DEPARTMENT_OTHER): Payer: Self-pay

## 2021-02-15 ENCOUNTER — Emergency Department (HOSPITAL_BASED_OUTPATIENT_CLINIC_OR_DEPARTMENT_OTHER)
Admission: EM | Admit: 2021-02-15 | Discharge: 2021-02-15 | Disposition: A | Discharge status: Home / Self Care | Payer: Medicare Other | Attending: Emergency Medicine | Admitting: Emergency Medicine

## 2021-02-15 ENCOUNTER — Telehealth: Payer: Self-pay | Admitting: *Deleted

## 2021-02-15 DIAGNOSIS — Z79899 Other long term (current) drug therapy: Secondary | ICD-10-CM | POA: Diagnosis not present

## 2021-02-15 DIAGNOSIS — R1110 Vomiting, unspecified: Secondary | ICD-10-CM | POA: Diagnosis present

## 2021-02-15 DIAGNOSIS — Z20822 Contact with and (suspected) exposure to covid-19: Secondary | ICD-10-CM | POA: Diagnosis not present

## 2021-02-15 DIAGNOSIS — I2510 Atherosclerotic heart disease of native coronary artery without angina pectoris: Secondary | ICD-10-CM | POA: Insufficient documentation

## 2021-02-15 DIAGNOSIS — R1011 Right upper quadrant pain: Secondary | ICD-10-CM

## 2021-02-15 DIAGNOSIS — I130 Hypertensive heart and chronic kidney disease with heart failure and stage 1 through stage 4 chronic kidney disease, or unspecified chronic kidney disease: Secondary | ICD-10-CM | POA: Insufficient documentation

## 2021-02-15 DIAGNOSIS — I509 Heart failure, unspecified: Secondary | ICD-10-CM | POA: Insufficient documentation

## 2021-02-15 DIAGNOSIS — E039 Hypothyroidism, unspecified: Secondary | ICD-10-CM | POA: Insufficient documentation

## 2021-02-15 DIAGNOSIS — J449 Chronic obstructive pulmonary disease, unspecified: Secondary | ICD-10-CM | POA: Diagnosis not present

## 2021-02-15 DIAGNOSIS — R1010 Upper abdominal pain, unspecified: Secondary | ICD-10-CM | POA: Diagnosis not present

## 2021-02-15 DIAGNOSIS — Z87891 Personal history of nicotine dependence: Secondary | ICD-10-CM | POA: Insufficient documentation

## 2021-02-15 DIAGNOSIS — Z7902 Long term (current) use of antithrombotics/antiplatelets: Secondary | ICD-10-CM | POA: Diagnosis not present

## 2021-02-15 DIAGNOSIS — R197 Diarrhea, unspecified: Secondary | ICD-10-CM | POA: Insufficient documentation

## 2021-02-15 DIAGNOSIS — Z7982 Long term (current) use of aspirin: Secondary | ICD-10-CM | POA: Diagnosis not present

## 2021-02-15 DIAGNOSIS — N184 Chronic kidney disease, stage 4 (severe): Secondary | ICD-10-CM | POA: Insufficient documentation

## 2021-02-15 DIAGNOSIS — K8020 Calculus of gallbladder without cholecystitis without obstruction: Secondary | ICD-10-CM | POA: Diagnosis not present

## 2021-02-15 DIAGNOSIS — J45909 Unspecified asthma, uncomplicated: Secondary | ICD-10-CM | POA: Diagnosis not present

## 2021-02-15 DIAGNOSIS — R112 Nausea with vomiting, unspecified: Secondary | ICD-10-CM | POA: Diagnosis not present

## 2021-02-15 DIAGNOSIS — R109 Unspecified abdominal pain: Secondary | ICD-10-CM | POA: Diagnosis not present

## 2021-02-15 DIAGNOSIS — Z96653 Presence of artificial knee joint, bilateral: Secondary | ICD-10-CM | POA: Insufficient documentation

## 2021-02-15 DIAGNOSIS — K5730 Diverticulosis of large intestine without perforation or abscess without bleeding: Secondary | ICD-10-CM | POA: Diagnosis not present

## 2021-02-15 DIAGNOSIS — N189 Chronic kidney disease, unspecified: Secondary | ICD-10-CM | POA: Diagnosis not present

## 2021-02-15 DIAGNOSIS — I10 Essential (primary) hypertension: Secondary | ICD-10-CM | POA: Diagnosis not present

## 2021-02-15 LAB — COMPREHENSIVE METABOLIC PANEL
ALT: 19 U/L (ref 0–44)
AST: 30 U/L (ref 15–41)
Albumin: 4 g/dL (ref 3.5–5.0)
Alkaline Phosphatase: 35 U/L — ABNORMAL LOW (ref 38–126)
Anion gap: 13 (ref 5–15)
BUN: 25 mg/dL — ABNORMAL HIGH (ref 8–23)
CO2: 23 mmol/L (ref 22–32)
Calcium: 10 mg/dL (ref 8.9–10.3)
Chloride: 104 mmol/L (ref 98–111)
Creatinine, Ser: 1.61 mg/dL — ABNORMAL HIGH (ref 0.44–1.00)
GFR, Estimated: 34 mL/min — ABNORMAL LOW (ref >60)
Glucose, Bld: 94 mg/dL (ref 70–99)
Potassium: 3.8 mmol/L (ref 3.5–5.1)
Sodium: 140 mmol/L (ref 135–145)
Total Bilirubin: 0.5 mg/dL (ref 0.3–1.2)
Total Protein: 6.8 g/dL (ref 6.5–8.1)

## 2021-02-15 LAB — CBC WITH DIFFERENTIAL/PLATELET
Abs Immature Granulocytes: 0.02 10*3/uL (ref 0.00–0.07)
Basophils Absolute: 0 10*3/uL (ref 0.0–0.1)
Basophils Relative: 0 %
Eosinophils Absolute: 0 10*3/uL (ref 0.0–0.5)
Eosinophils Relative: 0 %
HCT: 39.7 % (ref 36.0–46.0)
Hemoglobin: 13.3 g/dL (ref 12.0–15.0)
Immature Granulocytes: 0 %
Lymphocytes Relative: 10 %
Lymphs Abs: 0.7 10*3/uL (ref 0.7–4.0)
MCH: 30 pg (ref 26.0–34.0)
MCHC: 33.5 g/dL (ref 30.0–36.0)
MCV: 89.4 fL (ref 80.0–100.0)
Monocytes Absolute: 0.7 10*3/uL (ref 0.1–1.0)
Monocytes Relative: 10 %
Neutro Abs: 5.6 10*3/uL (ref 1.7–7.7)
Neutrophils Relative %: 80 %
Platelets: 198 10*3/uL (ref 150–400)
RBC: 4.44 MIL/uL (ref 3.87–5.11)
RDW: 13 % (ref 11.5–15.5)
WBC: 7.1 10*3/uL (ref 4.0–10.5)
nRBC: 0 % (ref 0.0–0.2)

## 2021-02-15 LAB — URINALYSIS, MICROSCOPIC (REFLEX)

## 2021-02-15 LAB — URINALYSIS, ROUTINE W REFLEX MICROSCOPIC
Bilirubin Urine: NEGATIVE
Glucose, UA: NEGATIVE mg/dL
Hgb urine dipstick: NEGATIVE
Ketones, ur: NEGATIVE mg/dL
Nitrite: NEGATIVE
Protein, ur: NEGATIVE mg/dL
Specific Gravity, Urine: >1.03 — ABNORMAL HIGH (ref 1.005–1.030)
pH: 5 (ref 5.0–8.0)

## 2021-02-15 LAB — CBG MONITORING, ED: Glucose-Capillary: 95 mg/dL (ref 70–99)

## 2021-02-15 LAB — RESP PANEL BY RT-PCR (FLU A&B, COVID) ARPGX2
Influenza A by PCR: NEGATIVE
Influenza B by PCR: NEGATIVE
SARS Coronavirus 2 by RT PCR: NEGATIVE

## 2021-02-15 LAB — LIPASE, BLOOD: Lipase: 18 U/L (ref 11–51)

## 2021-02-15 MED ORDER — ONDANSETRON 4 MG PO TBDP
4.0000 mg | ORAL_TABLET | Freq: Three times a day (TID) | ORAL | 0 refills | Status: DC | PRN
  Filled 2021-02-15: qty 20, 7d supply, fill #0

## 2021-02-15 MED ORDER — ONDANSETRON HCL 4 MG/2ML IJ SOLN
4.0000 mg | Freq: Once | INTRAMUSCULAR | Status: AC
  Administered 2021-02-15: 4 mg via INTRAVENOUS
  Filled 2021-02-15: qty 2

## 2021-02-15 MED ORDER — SODIUM CHLORIDE 0.9 % IV BOLUS
1000.0000 mL | Freq: Once | INTRAVENOUS | Status: AC
  Administered 2021-02-15: 1000 mL via INTRAVENOUS

## 2021-02-15 NOTE — Discharge Instructions (Addendum)
Your workup was overall reassuring in the ED today. Your CT scan and ultrasound showed gallstones which may be contributing to your symptoms versus a viral illness. You were unable to provide a stool sample at this time however if your diarrhea continues your PCP can send a stool study out for you as well.   Please follow up with [facility] for further evaluation of your gallstones to discuss elective removal of your gallbladder  Please pick up medication and take as prescribed to help with nausea and vomiting. Drink plenty of fluids to stay hydrated.   Follow up with your PCP regarding ED visit  Return to the ED for any new/worsening symptoms

## 2021-02-15 NOTE — ED Notes (Signed)
Per EDP order, pt given fluids and/or food for PO challenge. Pt verbalized understanding to utilize call bell if nausea or emesis occur. 

## 2021-02-15 NOTE — Telephone Encounter (Signed)
Call Type Triage / Clinical Caller Name Emily Mitchell Relationship To Patient Son Return Phone Number [phone] (Primary) Chief Complaint Medication Question (non symptomatic) Reason for Call Symptomatic / Request for [place] wants to know if it's alright to give his mother Imodium AD. She has heart issues. Translation No Nurse Assessment Nurse: Emily Manor, RN, Megan Date/Time (Eastern Time): 02/14/2021 4:40:16 PM Confirm and document reason for call. If symptomatic, describe symptoms. ---Caller wants to know if it's alright to give his mother Imodium AD. She has heart issues. Caller states patient started having diarrhea around 7am. Denies other symptoms.  Comments User: Sula Soda, RN Date/Time Eilene Ghazi Time): 02/14/2021 4:51:32 PM I advised caller not to give any new medications until she is evaluated by her physician. Caller verbalized understanding.

## 2021-02-15 NOTE — Telephone Encounter (Signed)
I have attempted to reach pt and her son no answer.

## 2021-02-15 NOTE — Telephone Encounter (Signed)
Pt's son states that he took her to the ER

## 2021-02-15 NOTE — ED Triage Notes (Signed)
Pt arrives pov with c/o N/V/D x 2 days. PT denies  abdominal pain, denies fever. Pt endorses stage 4 kidney disease.

## 2021-02-15 NOTE — ED Provider Notes (Signed)
[facility] EMERGENCY DEPARTMENT Provider Note   CSN: [id number] Arrival date & time: 02/15/21  0931     History Chief Complaint  Patient presents with   Emesis    Emily FIFER is a 75 y.o. female with PMHx HTN, COPD, CHF with EF 60-65%, GERD who presents to the ED today with complaint of nausea, NBNB emesis, and excessive watery diarrhea for the past 4 days. Pt also complains of fatigue and feeling ill. She denies any recent sick contacts or suspicious food intake. No recent antibiotic use. She called her PCP who advised she come to the ED for further evaluation. She has not taken anything at home for her symptoms as she was advised to avoid using imodium with her history of CHF. Pt also mentions that she had stage IV kidney disease and follows with Dr. Azzie Roup for same. Denies fevers or chills. Previous abdominal surgery includes hysterectomy.   The history is provided by the patient and medical records.      Past Medical History:  Diagnosis Date   Acid reflux    Anemia 07/05/2014   Anxiety and depression 12/14/2008   Qualifier: Diagnosis of  By: [name] LPN, Almyra Free     Arthritis    "fingers, toes, knees, joints" (07/18/2018)   Asthma    Atypical chest pain 05/23/2015   Back pain 02/02/2019   Breast mass, left 04/28/2019   CAD (coronary artery disease)    a. [place] with cath 06/2018 s/p DES to LAD.   CAD in native artery 07/19/2018   Candidal skin infection 02/28/2018   Carotid artery disease ([place]) 06/16/2015   S/p CEA    Carotid artery occlusion    a. R carotid occluded, 29-51% LICA.   Carotid bruit 02/23/2020   Chest pain 07/18/2018   CHF (congestive heart failure) (HCC)    Chicken pox as a child   Chronic kidney disease    Bright's Disease at age 7    Chronic obstructive pulmonary disease ([place]) 12/14/2008   Formatting of this note might be different from the original. Overview:  Spirometry 05/12/2013: FEV1 72% predicted FEV1 FVC ratio 73 predicted FEF 25 75  43%  predicted  Last Assessment & Plan:  OK to stop taking advair Use albuterol as needed only Check oxygen levels during sleep - we will call you with results Call if symptoms worse   Chronic rhinitis 06/14/2018   CKD (chronic kidney disease), stage IV ([place])    Follows with [facility], Dr Alonza Smoker    Congestive heart failure ([place]) 11/30/2015   Formatting of this note might be different from the original. Last Assessment & Plan:  No recent exacerbation   Constipation 12/17/2008   Qualifier: Diagnosis of  By: West[place] Pam    COPD (chronic obstructive pulmonary disease) (HCC)    COPD with asthma ([place]) 11/06/2017   Essential hypertension 10/18/2015   Formatting of this note might be different from the original. Last Assessment & Plan:  poorlycontrolled and under a great deal of stress, add Metoprolol XL 25 mg daily. Encouraged heart healthy diet such as the DASH diet and exercise as tolerated.   Essential tremor 11/05/2013   Exudative age-related macular degeneration of right eye with active choroidal neovascularization ([place]) 08/26/2015   Gastroesophageal reflux disease 12/14/2008   Formatting of this note might be different from the original. Overview:  Qualifier: Diagnosis of  By: [name] LPN, Almyra Free   Last Assessment & Plan:  Avoid offending foods, start probiotics.  Do not eat large meals in late evening and consider raising head of bed.   GERD (gastroesophageal reflux disease)    H. pylori infection 10/19/2013   H/O multiple allergies 62/95/2841   Helicobacter pylori (H. pylori) as the cause of diseases classified elsewhere 10/19/2013   Formatting of this note might be different from the original. Last Assessment & Plan:  Tetracycline, Pepto Bismol, Flagyl and Omeprazole.   Hepatitis 1970s   "don't know for sure which one it was; think it was A" (07/18/2018)   History of food allergy 08/14/2018   History of pulmonary embolism 07/17/2018   Hoarseness 03/09/2019   Hypercalcemia 03/09/2019    Hyperglycemia 03/14/2016   Hyperlipidemia    Hyperlipidemia, mixed 05/26/2013   Crestor caused N/V Did not tolerate Lipitor    Hypertension    Hypothyroidism    Knee pain, bilateral 12/17/2012   Left hip pain 05/23/2015   Low back pain 09/19/2015   Lower urinary tract infectious disease 10/19/2013   Lymphadenopathy 10/18/2017   Macular degeneration of both eyes 12/16/2015   Right eye is wet Left Eye is dry Shots every 11 to 12 weeks in right eye at opthamologist office   Medicare annual wellness visit, subsequent 02/21/2015   Mixed anxiety depressive disorder 12/14/2008   Formatting of this note might be different from the original. Overview:  Qualifier: Diagnosis of  By: [name] LPN, Almyra Free  Patient had increased  Tremors on Bupropion  Last Assessment & Plan:  Is struggling with the stress of her 51 year old granddaughter whom she raised moving back with her own mother, the patient is estranged from her daughter so she is very upset. Increase Citalopram 40 mg daily    Mumps 76 yrs old   Neck pain 04/22/2017   Numbness of finger 12/17/2012   Occlusion and stenosis of carotid artery without mention of cerebral infarction 12/17/2012   OSA (obstructive sleep apnea) 12/14/2008   PSG  06/2013-AHI 6/h 08/2011 ([facility]) AHI 4/h No longer needs CPAP    Pneumonia    "at least once" (07/18/2018)   Preventative health care 03/14/2016   Pulmonary emboli ([place]) 05/26/2013   Renal insufficiency 07/17/2018   Right shoulder pain 10/18/2017   Sensation of foreign body in larynx 04/08/2019   Sleep apnea    "don't use CPAP anymore; dr said I didn't have to" (07/18/2018)   Static tremor 11/05/2013   Formatting of this note might be different from the original. Last Assessment & Plan:  Follows with [facility]   Statin intolerance 09/29/2019   Stroke [facility])    "been told I've had some strokes; didn't know I'd had them" (07/18/2018)   Tremor    UTI (lower urinary tract infection) 10/19/2013   Vitamin D deficiency 03/03/2018    Vitreomacular adhesion of both eyes 01/07/2019   Formatting of this note might be different from the original. no traction    Patient Active Problem List   Diagnosis Date Noted   Asthma    Carotid artery occlusion    Chicken pox    Hepatitis    Hyperlipidemia    Hypertension    Mumps    Pneumonia    Sleep apnea    Stroke [facility])    Tremor    GERD (gastroesophageal reflux disease)    COPD (chronic obstructive pulmonary disease) (HCC)    CHF (congestive heart failure) (HCC)    Carotid bruit 02/23/2020   Statin intolerance 09/29/2019   Breast mass, left 04/28/2019   Sensation  of foreign body in larynx 04/08/2019   Hypercalcemia 03/09/2019   Hoarseness 03/09/2019   Back pain 02/02/2019   Vitreomacular adhesion of both eyes 01/07/2019   History of food allergy 08/14/2018   CAD (coronary artery disease) 07/25/2018   CAD in native artery 07/19/2018   Chest pain 07/18/2018   History of pulmonary embolism 07/17/2018   Renal insufficiency 07/17/2018   H/O multiple allergies 07/14/2018   Chronic rhinitis 06/14/2018   Vitamin D deficiency 03/03/2018   Candidal skin infection 02/28/2018   COPD with asthma ([place]) 11/06/2017   Lymphadenopathy 10/18/2017   Right shoulder pain 10/18/2017   Neck pain 04/22/2017   Hyperglycemia 03/14/2016   Preventative health care 03/14/2016   Macular degeneration of both eyes 12/16/2015   Arthritis 11/30/2015   Congestive heart failure ([place]) 11/30/2015   Chronic kidney disease 11/30/2015   Essential hypertension 10/18/2015   Low back pain 09/19/2015   Exudative age-related macular degeneration of right eye with active choroidal neovascularization ([place]) 08/26/2015   Carotid artery disease ([place]) 06/16/2015   Left hip pain 05/23/2015   Atypical chest pain 05/23/2015   Medicare annual wellness visit, subsequent 02/21/2015   Anemia 07/05/2014   Essential tremor 11/05/2013   Static tremor 11/05/2013   Lower urinary tract infectious disease 10/19/2013    H. pylori infection 15/40/0867   Helicobacter pylori (H. pylori) as the cause of diseases classified elsewhere 10/19/2013   Hyperlipidemia, mixed 05/26/2013   Pulmonary emboli ([place]) 05/26/2013   Acid reflux    CKD (chronic kidney disease), stage IV (HCC)    Hypothyroidism    Occlusion and stenosis of carotid artery without mention of cerebral infarction 12/17/2012   Knee pain, bilateral 12/17/2012   Numbness of finger 12/17/2012   Constipation 12/17/2008   Anxiety and depression 12/14/2008   OSA (obstructive sleep apnea) 12/14/2008   Chronic obstructive pulmonary disease ([place]) 12/14/2008   Gastroesophageal reflux disease 12/14/2008   Mixed anxiety depressive disorder 12/14/2008    Past Surgical History:  Procedure Laterality Date   APPENDECTOMY  1984   CARDIAC CATHETERIZATION  ~ 2006   CAROTID ANGIOGRAPHY Left 08/20/2018   Procedure: CAROTID ANGIOGRAPHY;  Surgeon: Serafina Mitchell, MD;  Location: [facility] LAB;  Service: Cardiovascular;  Laterality: Left;   CAROTID ENDARTERECTOMY Right 05/10/07   cea   CATARACT EXTRACTION Left    CATARACT EXTRACTION W/ INTRAOCULAR LENS  IMPLANT, BILATERAL Bilateral    CORONARY ANGIOPLASTY WITH STENT PLACEMENT  07/18/2018   CORONARY STENT INTERVENTION N/A 07/18/2018   Procedure: CORONARY STENT INTERVENTION;  Surgeon: Lorretta Harp, MD;  Location: [facility];  Service: Cardiovascular;  Laterality: N/A;   INTRAVASCULAR PRESSURE WIRE/FFR STUDY N/A 07/18/2018   Procedure: INTRAVASCULAR PRESSURE WIRE/FFR STUDY;  Surgeon: Lorretta Harp, MD;  Location: [facility];  Service: Cardiovascular;  Laterality: N/A;   JOINT REPLACEMENT     LEFT HEART CATH AND CORONARY ANGIOGRAPHY N/A 07/18/2018   Procedure: LEFT HEART CATH AND CORONARY ANGIOGRAPHY;  Surgeon: Lorretta Harp, MD;  Location: [facility];  Service: Cardiovascular;  Laterality: N/A;   MULTIPLE TOOTH EXTRACTIONS     TOTAL KNEE ARTHROPLASTY Bilateral 2005 - 2011    right - left   VAGINAL HYSTERECTOMY  1984   WISDOM TOOTH EXTRACTION       OB History   No obstetric history on file.     Family History  Problem Relation Age of Onset   Stroke Mother  mini stroke   Kidney disease Mother    Heart failure Mother    Hypertension Mother    Diabetes Sister        type 2   Kidney disease Sister    Allergic rhinitis Sister    Heart attack Maternal Grandfather    Hyperlipidemia Sister    Colon cancer Neg Hx     Social History   Tobacco Use   Smoking status: Former    Packs/day: 2.00    Years: 30.00    Pack years: 60.00    Types: Cigarettes    Quit date: 12/03/1994    Years since quitting: 26.2   Smokeless tobacco: Never  Vaping Use   Vaping Use: Never used  Substance Use Topics   Alcohol use: Yes    Comment: 07/18/2018 "1 drink q 1-2 months; if that"   Drug use: Never    Home Medications Prior to Admission medications   Medication Sig Start Date End Date Taking? Authorizing Provider  ondansetron (ZOFRAN ODT) 4 MG disintegrating tablet Take 1 tablet (4 mg total) by mouth every 8 (eight) hours as needed for nausea or vomiting. 02/15/21  Yes Alroy Bailiff, Rica Heather, PA-C  acetaminophen (TYLENOL) 500 MG tablet Take 500 mg by mouth as needed.    [provider]  albuterol (VENTOLIN HFA) 108 (90 Base) MCG/ACT inhaler Inhale 2 puffs into the lungs every 6 (six) hours as needed for wheezing or shortness of breath. 01/28/20   Rigoberto Noel, MD  aMILoride (MIDAMOR) 5 MG tablet Take 5 mg by mouth daily.    [provider]  amLODipine (NORVASC) 2.5 MG tablet Take 1 tablet (2.5 mg total) by mouth daily. 01/19/21   Mosie Lukes, MD  aspirin EC 81 MG tablet Take 1 tablet (81 mg total) by mouth daily. 07/17/18   Revankar, Reita Cliche, MD  atorvastatin (LIPITOR) 10 MG tablet TAKE ONE TABLET BY MOUTH DAILY 01/19/21   Revankar, Reita Cliche, MD  BREZTRI AEROSPHERE 160-9-4.8 MCG/ACT AERO INHALE TWO PUFFS BY MOUTH TWICE A DAY 01/19/21   Rigoberto Noel,  MD  Calcium Citrate-Vitamin D (CALCIUM + D PO) Take 1 tablet by mouth daily.    [provider]  Calcium Citrate-Vitamin D 200-250 MG-UNIT TABS Take 1 tablet by mouth daily.    [provider]  carvedilol (COREG) 6.25 MG tablet Take 1 tablet (6.25 mg total) by mouth 2 (two) times daily with a meal. 01/19/21   Mosie Lukes, MD  Cholecalciferol 25 MCG (1000 UT) tablet Take 1,000 Units by mouth daily. 12/05/18   [provider]  Choline Fenofibrate (FENOFIBRIC ACID) 135 MG CPDR Take 1 capsule by mouth daily. 01/19/21   Mosie Lukes, MD  clonazePAM (KLONOPIN) 1 MG tablet TAKE ONE TABLET BY MOUTH THREE TIMES A DAY AS NEEDED FOR ANXIETY 10/25/20   Mosie Lukes, MD  clopidogrel (PLAVIX) 75 MG tablet Take 1 tablet (75 mg total) by mouth daily. 04/30/20   Mosie Lukes, MD  cyanocobalamin 100 MCG tablet Take 500 mcg by mouth daily. 12/05/18   [provider]  ezetimibe (ZETIA) 10 MG tablet TAKE ONE TABLET BY MOUTH DAILY 10/22/20   Mosie Lukes, MD  famotidine (PEPCID) 40 MG tablet Take 1 tablet (40 mg total) by mouth daily. 11/05/20   Mosie Lukes, MD  FLUoxetine (PROZAC) 20 MG tablet TAKE ONE TABLET BY MOUTH DAILY 10/22/20   Mosie Lukes, MD  gabapentin (NEURONTIN) 100 MG capsule Take 2 capsules (  200 mg total) by mouth at bedtime. 01/19/21   Mosie Lukes, MD  levothyroxine (SYNTHROID) 112 MCG tablet TAKE ONE TABLET BY MOUTH DAILY 10/22/20   Mosie Lukes, MD  montelukast (SINGULAIR) 10 MG tablet Take 1 tablet (10 mg total) by mouth at bedtime as needed. 04/30/20   Mosie Lukes, MD  Multiple Vitamins-Minerals (PRESERVISION AREDS 2 PO) Take 1 capsule by mouth daily.    [provider]  Omega-3 Fatty Acids (FISH OIL PO) Take 1 capsule by mouth daily.    [provider]  Probiotic Product (VSL#3) CAPS Take 1 capsule by mouth daily.    [provider]  traMADol (ULTRAM) 50 MG tablet Take 1 tablet (50 mg total) by mouth every 8 (eight)  hours as needed for moderate pain or severe pain. 01/10/21   Mosie Lukes, MD    Allergies    Niaspan [niacin er], Pantoprazole, Bupropion, Nitroglycerin, Penicillins, Statins, Sulfonamide derivatives, Apple, and Banana  Review of Systems   Review of Systems  Constitutional:  Positive for fatigue. Negative for chills and fever.  Respiratory:  Negative for cough.   Gastrointestinal:  Positive for diarrhea and vomiting. Negative for abdominal pain and blood in stool.  Musculoskeletal:  Negative for myalgias.  All other systems reviewed and are negative.  Physical Exam Updated Vital Signs BP (!) 143/62   Pulse 66   Temp 98.2 F (36.8 C) (Oral)   Resp 16   Ht 5\' 3"  (1.6 m)   Wt 81.6 kg   SpO2 94%   BMI 31.89 kg/m   Physical Exam Vitals and nursing note reviewed.  Constitutional:      Appearance: She is not ill-appearing or diaphoretic.  HENT:     Head: Normocephalic and atraumatic.     Mouth/Throat:     Mouth: Mucous membranes are dry.  Eyes:     Conjunctiva/sclera: Conjunctivae normal.  Cardiovascular:     Rate and Rhythm: Normal rate and regular rhythm.  Pulmonary:     Effort: Pulmonary effort is normal.     Breath sounds: Normal breath sounds. No wheezing, rhonchi or rales.  Abdominal:     Palpations: Abdomen is soft.     Tenderness: There is abdominal tenderness. There is no right CVA tenderness, left CVA tenderness, guarding or rebound.     Comments: Soft, + periumbilical abdominal TTP, +BS throughout, no r/g/r, neg murphy's, neg mcburney's, no CVA TTP  Musculoskeletal:     Cervical back: Neck supple.  Skin:    General: Skin is warm and dry.  Neurological:     Mental Status: She is alert.    ED Results / Procedures / Treatments   Labs (all labs ordered are listed, but only abnormal results are displayed) Labs Reviewed  COMPREHENSIVE METABOLIC PANEL - Abnormal; Notable for the following components:      Result Value   BUN 25 (*)    Creatinine, Ser 1.61  (*)    Alkaline Phosphatase 35 (*)    GFR, Estimated 34 (*)    All other components within normal limits  URINALYSIS, ROUTINE W REFLEX MICROSCOPIC - Abnormal; Notable for the following components:   Specific Gravity, Urine >1.030 (*)    Leukocytes,Ua TRACE (*)    All other components within normal limits  URINALYSIS, MICROSCOPIC (REFLEX) - Abnormal; Notable for the following components:   Bacteria, UA MANY (*)    All other components within normal limits  RESP PANEL BY RT-PCR (FLU A&B, COVID) [id number]  GASTROINTESTINAL  PANEL BY PCR, STOOL (REPLACES STOOL CULTURE)  C DIFFICILE QUICK SCREEN W PCR REFLEX    LIPASE, BLOOD  CBC WITH DIFFERENTIAL/PLATELET  CBG MONITORING, ED    EKG EKG Interpretation  Date/Time:  Tuesday February 15 2021 09:52:41 EDT Ventricular Rate:  77 PR Interval:  200 QRS Duration: 70 QT Interval:  386 QTC Calculation: 436 R Axis:   -2 Text Interpretation: Normal sinus rhythm Inferior infarct , age undetermined Anterior infarct , age undetermined Abnormal ECG No significant change since last tracing Confirmed by Gareth Morgan [phone]) on 02/15/2021 10:18:44 AM  Radiology CT Abdomen Pelvis Wo Contrast  Result Date: 02/15/2021 CLINICAL DATA:  Abdominal pain, nausea, vomiting, and diarrhea for 2 days, history stage IV chronic kidney disease, COPD, coronary artery disease, CHF, hypertension, stroke, former smoker EXAM: CT ABDOMEN AND PELVIS WITHOUT CONTRAST TECHNIQUE: Multidetector CT imaging of the abdomen and pelvis was performed following the standard protocol without IV contrast. Sagittal and coronal MPR images reconstructed from axial data set. No oral contrast administered. COMPARISON:  02/09/2006 FINDINGS: Lower chest: Lung bases clear Hepatobiliary: Multiple calcified gallstones in gallbladder. Gallbladder normally distended without wall thickening or surrounding inflammatory changes by CT. Liver normal appearance. Pancreas: Normal appearance Spleen: Normal appearance  Adrenals/Urinary Tract: Adrenal glands, kidneys, ureters, and bladder normal appearance Stomach/Bowel: Sigmoid diverticulosis without evidence of diverticulitis. Food debris in stomach, stomach otherwise unremarkable. Remaining bowel loops normal appearance. Appendix surgically absent by history. Vascular/Lymphatic: Atherosclerotic calcifications aorta and iliac arteries without aneurysm. No adenopathy. Scattered pelvic phleboliths. Reproductive: Uterus surgically absent.  Atrophic ovaries. Other: No free air or free fluid. No hernia or inflammatory process. Musculoskeletal: Diffuse osseous demineralization. Degenerative facet disease changes lumbar spine. Diffusely bulging L4-L5 and L5-S1 discs with AP narrowing of spinal canal at L4-L5. IMPRESSION: Cholelithiasis. Sigmoid diverticulosis without evidence of diverticulitis. No acute intra-abdominal or intrapelvic abnormalities. Spinal stenosis L4-L5. Aortic Atherosclerosis (ICD10-I70.0). Electronically Signed   By: Lavonia Dana M.D.   On: 02/15/2021 13:43   US Abdomen Limited RUQ (LIVER/GB)  Result Date: 02/15/2021 CLINICAL DATA:  Right upper quadrant abdominal pain for 2 days EXAM: ULTRASOUND ABDOMEN LIMITED RIGHT UPPER QUADRANT COMPARISON:  CT scan 02/15/2021 FINDINGS: Gallbladder: Multiple gallstones are observed measuring up to 1.9 cm in long axis. Sonographic Murphy's sign absent. No gallbladder wall thickening or pericholecystic fluid. Common bile duct: Diameter: 0.6 cm, within normal limits. No directly visualized choledocholithiasis. Liver: No focal lesion identified. Within normal limits in parenchymal echogenicity. Portal vein is patent on color Doppler imaging with normal direction of blood flow towards the liver. Other: None. IMPRESSION: 1. Cholelithiasis. No gallbladder wall thickening or pericholecystic fluid. No dilation of the common bile duct. Electronically Signed   By: Van Clines M.D.   On: 02/15/2021 14:54    Procedures Procedures    Medications Ordered in ED Medications  sodium chloride 0.9 % bolus 1,000 mL (0 mLs Intravenous Stopped 02/15/21 1253)  ondansetron (ZOFRAN) injection 4 mg (4 mg Intravenous Given 02/15/21 1055)    ED Course  I have reviewed the triage vital signs and the nursing notes.  Pertinent labs & imaging results that were available during my care of the patient were reviewed by me and considered in my medical decision making (see chart for details).  Clinical Course as of 02/15/21 1537  Tue Feb 15, 2021  1039 WBC: 7.1 [MV]    Clinical Course User Index [MV] Eustaquio Maize, PA-C   MDM Rules/Calculators/A&P  75 year old female who presents to the ED today complaining of nausea, nonbloody nonbilious emesis, diarrhea for the past 4 days.  On arrival to the ED today patient is afebrile, nontachycardic and nontachypneic.  Blood pressure initially very low at 76/54 however with repeat in the room in the 161W systolic.  It does appear that her son called PCP earlier this week with readings of low blood pressure and she was advised to withhold her amlodipine which she has been doing.  Suspect low blood pressure secondary to hypovolemia as patient reports excessive diarrhea.  She denies any recent sick contacts or antibiotic use.  We will plan for labs, GI panel, C. difficile at this time as well as COVID test.  Given age will likely need CT scan however patient does have stage IV kidney disease likely will not be able to receive IV contrast.   CBC without leukocytosis. Hgb stable at 13.3 CMP with creatinine 1.61; stable from baseline. No other electrolyte abnormalities  Lab Results  Component Value Date   CREATININE 1.61 (H) 02/15/2021   CREATININE 1.84 (H) 11/10/2020   CREATININE 1.91 (H) 06/03/2020   Lipase 18 U/A with trace leuks, 6-10 WBC, many bacteria and 6-10 squamous epithelial cells; suspect contamination.  COVID and flu negative. Will proceed with CT scan at this  time.   CT scan: IMPRESSION:  Cholelithiasis.     Sigmoid diverticulosis without evidence of diverticulitis.     No acute intra-abdominal or intrapelvic abnormalities.     Spinal stenosis L4-L5.     Aortic Atherosclerosis (ICD10-I70.0).   Ultrasound obtained - no acute cholecystitis at this time.   On reevaluation pt resting comfortably. Reports improvement in symptoms. Has not vomited since being in the ED. Will plan to fluid challenge. Blood pressure significantly improved and has been in the 130s persistently. Orthostatics negative. If able to fluid challenge will discharge home with zofran and PCP follow up. Pt still unable to provide stool sample in the ED; lower suspicion for C diff at this time due to same.   Pt able to tolerate fluids. Stable for discharge home.   This note was prepared using Dragon voice recognition software and may include unintentional dictation errors due to the inherent limitations of voice recognition software.   Final Clinical Impression(s) / ED Diagnoses Final diagnoses:  RUQ abdominal pain  Calculus of gallbladder without cholecystitis without obstruction  Nausea vomiting and diarrhea    Rx / DC Orders ED Discharge Orders          Ordered    ondansetron (ZOFRAN ODT) 4 MG disintegrating tablet  Every 8 hours PRN        02/15/21 1533             Discharge Instructions      Your workup was overall reassuring in the ED today. Your CT scan and ultrasound showed gallstones which may be contributing to your symptoms versus a viral illness. You were unable to provide a stool sample at this time however if your diarrhea continues your PCP can send a stool study out for you as well.   Please follow up with [facility] for further evaluation of your gallstones to discuss elective removal of your gallbladder  Please pick up medication and take as prescribed to help with nausea and vomiting. Drink plenty of fluids to stay hydrated.    Follow up with your PCP regarding ED visit  Return to the ED for any new/worsening symptoms  Eustaquio Maize, PA-C 02/15/21 1537    Gareth Morgan, MD 02/16/21 2215

## 2021-02-17 ENCOUNTER — Ambulatory Visit (INDEPENDENT_AMBULATORY_CARE_PROVIDER_SITE_OTHER): Payer: Medicare Other | Admitting: Family Medicine

## 2021-02-17 ENCOUNTER — Encounter: Payer: Self-pay | Admitting: *Deleted

## 2021-02-17 ENCOUNTER — Other Ambulatory Visit: Payer: Self-pay

## 2021-02-17 VITALS — BP 72/46 | HR 71 | Temp 98.8°F | Resp 12

## 2021-02-17 DIAGNOSIS — I10 Essential (primary) hypertension: Secondary | ICD-10-CM

## 2021-02-17 DIAGNOSIS — J449 Chronic obstructive pulmonary disease, unspecified: Secondary | ICD-10-CM | POA: Diagnosis not present

## 2021-02-17 DIAGNOSIS — R7989 Other specified abnormal findings of blood chemistry: Secondary | ICD-10-CM | POA: Diagnosis not present

## 2021-02-17 DIAGNOSIS — K529 Noninfective gastroenteritis and colitis, unspecified: Secondary | ICD-10-CM | POA: Diagnosis not present

## 2021-02-17 DIAGNOSIS — R197 Diarrhea, unspecified: Secondary | ICD-10-CM

## 2021-02-17 DIAGNOSIS — N184 Chronic kidney disease, stage 4 (severe): Secondary | ICD-10-CM

## 2021-02-17 DIAGNOSIS — R739 Hyperglycemia, unspecified: Secondary | ICD-10-CM | POA: Diagnosis not present

## 2021-02-17 DIAGNOSIS — E782 Mixed hyperlipidemia: Secondary | ICD-10-CM | POA: Diagnosis not present

## 2021-02-17 DIAGNOSIS — R109 Unspecified abdominal pain: Secondary | ICD-10-CM

## 2021-02-17 DIAGNOSIS — K8021 Calculus of gallbladder without cholecystitis with obstruction: Secondary | ICD-10-CM | POA: Diagnosis not present

## 2021-02-17 DIAGNOSIS — E038 Other specified hypothyroidism: Secondary | ICD-10-CM | POA: Diagnosis not present

## 2021-02-17 MED ORDER — ONDANSETRON HCL 4 MG PO TABS: 4.0000 mg | ORAL_TABLET | Freq: Three times a day (TID) | ORAL | 0 refills | Status: DC | PRN

## 2021-02-17 NOTE — Patient Instructions (Addendum)
Increase fluids, gatorade Do not take your amlodipine or carvedilol for the next 5 days and/or you eating and drinking better  Hypotension As your heart beats, it forces blood through your body. This force is called blood pressure. If you have hypotension, you have low blood pressure. When your blood pressure is too low, you may not get enough blood to your brain or other parts of your body. This may cause you to feel weak, light-headed, have a fast heartbeat, or even pass out (faint). Low blood pressure may be harmless, or it may cause serious problems. What are the causes? Blood loss. Not enough water in the body (dehydration). Heart problems. Hormone problems. Pregnancy. A very bad infection. Not having enough of certain nutrients. Very bad allergic reactions. Certain medicines. What increases the risk? Age. The risk increases as you get older. Conditions that affect the heart or the brain and spinal cord (central nervous system). Taking certain medicines. Being pregnant. What are the signs or symptoms? Feeling: Weak. Light-headed. Dizzy. Tired (fatigued). Blurred vision. Fast heartbeat. Passing out, in very bad cases. How is this treated? Changing your diet. This may involve eating more salt (sodium) or drinking more water. Taking medicines to raise your blood pressure. Changing how much you take (the dosage) of some of your medicines. Wearing compression stockings. These stockings help to prevent blood clots and reduce swelling in your legs. In some cases, you may need to go to the hospital for: Fluid replacement. This means you will receive fluids through an IV tube. Blood replacement. This means you will receive donated blood through an IV tube (transfusion). Treating an infection or heart problems, if this applies. Monitoring. You may need to be monitored while medicines that you are taking wear off. Follow these instructions at home: Eating and drinking  Drink  enough fluids to keep your pee (urine) pale yellow. Eat a healthy diet. Follow instructions from your doctor about what you can eat or drink. A healthy diet includes: Fresh fruits and vegetables. Whole grains. Low-fat (lean) meats. Low-fat dairy products. Eat extra salt only as told. Do not add extra salt to your diet unless your doctor tells you to. Eat small meals often. Avoid standing up quickly after you eat.  Medicines Take over-the-counter and prescription medicines only as told by your doctor. Follow instructions from your doctor about changing how much you take of your medicines, if this applies. Do not stop or change any of your medicines on your own. General instructions  Wear compression stockings as told by your doctor. Get up slowly from lying down or sitting. Avoid hot showers and a lot of heat as told by your doctor. Return to your normal activities as told by your doctor. Ask what activities are safe for you. Do not use any products that contain nicotine or tobacco, such as cigarettes, e-cigarettes, and chewing tobacco. If you need help quitting, ask your doctor. Keep all follow-up visits as told by your doctor. This is important.  Contact a doctor if: You throw up (vomit). You have watery poop (diarrhea). You have a fever for more than 2-3 days. You feel more thirsty than normal. You feel weak and tired. Get help right away if: You have chest pain. You have a fast or uneven heartbeat. You lose feeling (have numbness) in any part of your body. You cannot move your arms or your legs. You have trouble talking. You get sweaty or feel light-headed. You pass out. You have trouble breathing. You have  trouble staying awake. You feel mixed up (confused). Summary Hypotension is also called low blood pressure. It is when the force of blood pumping through your arteries is too weak. Hypotension may be harmless, or it may cause serious problems. Treatment may include  changing your diet and medicines, and wearing compression stockings. In very bad cases, you may need to go to the hospital. This information is not intended to replace advice given to you by your health care provider. Make sure you discuss any questions you have with your healthcare provider. Document Revised: 01/10/2018 Document Reviewed: 01/10/2018 Elsevier Patient Education  [employer].

## 2021-02-18 LAB — CBC WITH DIFFERENTIAL/PLATELET
Basophils Absolute: 0 10*3/uL (ref 0.0–0.1)
Basophils Relative: 0.6 % (ref 0.0–3.0)
Eosinophils Absolute: 0.1 10*3/uL (ref 0.0–0.7)
Eosinophils Relative: 0.8 % (ref 0.0–5.0)
HCT: 38.5 % (ref 36.0–46.0)
Hemoglobin: 12.7 g/dL (ref 12.0–15.0)
Lymphocytes Relative: 13.1 % (ref 12.0–46.0)
Lymphs Abs: 0.9 10*3/uL (ref 0.7–4.0)
MCHC: 33.1 g/dL (ref 30.0–36.0)
MCV: 88.8 fl (ref 78.0–100.0)
Monocytes Absolute: 0.8 10*3/uL (ref 0.1–1.0)
Monocytes Relative: 11.2 % (ref 3.0–12.0)
Neutro Abs: 5 10*3/uL (ref 1.4–7.7)
Neutrophils Relative %: 74.3 % (ref 43.0–77.0)
Platelets: 226 10*3/uL (ref 150.0–400.0)
RBC: 4.34 Mil/uL (ref 3.87–5.11)
RDW: 13.6 % (ref 11.5–15.5)
WBC: 6.8 10*3/uL (ref 4.0–10.5)

## 2021-02-18 LAB — COMPREHENSIVE METABOLIC PANEL
ALT: 17 U/L (ref 0–35)
AST: 26 U/L (ref 0–37)
Albumin: 3.6 g/dL (ref 3.5–5.2)
Alkaline Phosphatase: 34 U/L — ABNORMAL LOW (ref 39–117)
BUN: 22 mg/dL (ref 6–23)
CO2: 25 mEq/L (ref 19–32)
Calcium: 9.7 mg/dL (ref 8.4–10.5)
Chloride: 105 mEq/L (ref 96–112)
Creatinine, Ser: 2.07 mg/dL — ABNORMAL HIGH (ref 0.40–1.20)
GFR: 23.43 mL/min — ABNORMAL LOW (ref >60.00)
Glucose, Bld: 89 mg/dL (ref 70–99)
Potassium: 3.8 mEq/L (ref 3.5–5.1)
Sodium: 140 mEq/L (ref 135–145)
Total Bilirubin: 0.5 mg/dL (ref 0.2–1.2)
Total Protein: 6 g/dL (ref 6.0–8.3)

## 2021-02-18 LAB — SEDIMENTATION RATE: Sed Rate: 6 mm/hr (ref 0–30)

## 2021-02-18 NOTE — Progress Notes (Signed)
Notify creatinine is up as we thought it might be so would like to repeat cmp in 1-2 weeks. The good news is potassium and sodium are normal

## 2021-02-20 NOTE — Assessment & Plan Note (Signed)
No recent flares 

## 2021-02-20 NOTE — Assessment & Plan Note (Signed)
Hydrate and monitor. She is currently dehydrated and after a lengthy discussion with her and her son. They decide she will return home for now and push fluids with some Powerade and water, if she is unable to hydrate adequately they will take her to the ER. Repeat labs next week.

## 2021-02-20 NOTE — Assessment & Plan Note (Signed)
Blood pressure is low today. She will hold her Carvedilol and Amlodipine for the next 5 days and then restart her Carvedilol only for now as they monitor her BP closely.

## 2021-02-20 NOTE — Progress Notes (Signed)
Subjective:    Patient ID: Emily Mitchell, female    DOB: 19-May-1946, 75 y.o.   MRN: [id number]  Chief Complaint  Patient presents with   blood pressure dropping    Drops low when standing    Cholelithiasis   Diarrhea    Since Saturday     HPI Patient is in today for evaluation of diarrhea after a recent ER visit. She is accompanied by her son and they note she continues to have several episodes of loose stool daily. No bloody or tarry stool. Some nausea but no significant vomiting. Abdominal pain and cramping noted intermittently. No fevers or chills. She went to the emergency room recently and they did diagnose her with gallstones. She has right upper pain intermittently. She has other abdominal pain at times but none is quite as notable. She has been experiencing low blood pressure numbers but has continued to take her blood pressure meds. Denies CP/palp/SOB/HA/congestion/fevers or GU c/o. Taking meds as prescribed   Past Medical History:  Diagnosis Date   Acid reflux    Anemia 07/05/2014   Anxiety and depression 12/14/2008   Qualifier: Diagnosis of  By: [name] LPN, Almyra Free     Arthritis    "fingers, toes, knees, joints" (07/18/2018)   Asthma    Atypical chest pain 05/23/2015   Back pain 02/02/2019   Breast mass, left 04/28/2019   CAD (coronary artery disease)    a. [place] with cath 06/2018 s/p DES to LAD.   CAD in native artery 07/19/2018   Candidal skin infection 02/28/2018   Carotid artery disease ([place]) 06/16/2015   S/p CEA    Carotid artery occlusion    a. R carotid occluded, 64-33% LICA.   Carotid bruit 02/23/2020   Chest pain 07/18/2018   CHF (congestive heart failure) (HCC)    Chicken pox as a child   Chronic kidney disease    Bright's Disease at age 37    Chronic obstructive pulmonary disease ([place]) 12/14/2008   Formatting of this note might be different from the original. Overview:  Spirometry 05/12/2013: FEV1 72% predicted FEV1 FVC ratio 73 predicted FEF 25 75  43%  predicted  Last Assessment & Plan:  OK to stop taking advair Use albuterol as needed only Check oxygen levels during sleep - we will call you with results Call if symptoms worse   Chronic rhinitis 06/14/2018   CKD (chronic kidney disease), stage IV ([place])    Follows with [facility], Dr Alonza Smoker    Congestive heart failure ([place]) 11/30/2015   Formatting of this note might be different from the original. Last Assessment & Plan:  No recent exacerbation   Constipation 12/17/2008   Qualifier: Diagnosis of  By: Westly Pam    COPD (chronic obstructive pulmonary disease) (HCC)    COPD with asthma ([place]) 11/06/2017   Essential hypertension 10/18/2015   Formatting of this note might be different from the original. Last Assessment & Plan:  poorlycontrolled and under a great deal of stress, add Metoprolol XL 25 mg daily. Encouraged heart healthy diet such as the DASH diet and exercise as tolerated.   Essential tremor 11/05/2013   Exudative age-related macular degeneration of right eye with active choroidal neovascularization ([place]) 08/26/2015   Gastroesophageal reflux disease 12/14/2008   Formatting of this note might be different from the original. Overview:  Qualifier: Diagnosis of  By: [name] LPN, Almyra Free   Last Assessment & Plan:  Avoid offending foods, start probiotics. Do not eat  large meals in late evening and consider raising head of bed.   GERD (gastroesophageal reflux disease)    H. pylori infection 10/19/2013   H/O multiple allergies 48/54/6270   Helicobacter pylori (H. pylori) as the cause of diseases classified elsewhere 10/19/2013   Formatting of this note might be different from the original. Last Assessment & Plan:  Tetracycline, Pepto Bismol, Flagyl and Omeprazole.   Hepatitis 1970s   "don't know for sure which one it was; think it was A" (07/18/2018)   History of food allergy 08/14/2018   History of pulmonary embolism 07/17/2018   Hoarseness 03/09/2019   Hypercalcemia 03/09/2019    Hyperglycemia 03/14/2016   Hyperlipidemia    Hyperlipidemia, mixed 05/26/2013   Crestor caused N/V Did not tolerate Lipitor    Hypertension    Hypothyroidism    Knee pain, bilateral 12/17/2012   Left hip pain 05/23/2015   Low back pain 09/19/2015   Lower urinary tract infectious disease 10/19/2013   Lymphadenopathy 10/18/2017   Macular degeneration of both eyes 12/16/2015   Right eye is wet Left Eye is dry Shots every 11 to 12 weeks in right eye at opthamologist office   Medicare annual wellness visit, subsequent 02/21/2015   Mixed anxiety depressive disorder 12/14/2008   Formatting of this note might be different from the original. Overview:  Qualifier: Diagnosis of  By: [name] LPN, Almyra Free  Patient had increased  Tremors on Bupropion  Last Assessment & Plan:  Is struggling with the stress of her 80 year old granddaughter whom she raised moving back with her own mother, the patient is estranged from her daughter so she is very upset. Increase Citalopram 40 mg daily    Mumps 75 yrs old   Neck pain 04/22/2017   Numbness of finger 12/17/2012   Occlusion and stenosis of carotid artery without mention of cerebral infarction 12/17/2012   OSA (obstructive sleep apnea) 12/14/2008   PSG  06/2013-AHI 6/h 08/2011 ([facility]) AHI 4/h No longer needs CPAP    Pneumonia    "at least once" (07/18/2018)   Preventative health care 03/14/2016   Pulmonary emboli ([place]) 05/26/2013   Renal insufficiency 07/17/2018   Right shoulder pain 10/18/2017   Sensation of foreign body in larynx 04/08/2019   Sleep apnea    "don't use CPAP anymore; dr said I didn't have to" (07/18/2018)   Static tremor 11/05/2013   Formatting of this note might be different from the original. Last Assessment & Plan:  Follows with [facility]   Statin intolerance 09/29/2019   Stroke [facility])    "been told I've had some strokes; didn't know I'd had them" (07/18/2018)   Tremor    UTI (lower urinary tract infection) 10/19/2013   Vitamin D deficiency 03/03/2018    Vitreomacular adhesion of both eyes 01/07/2019   Formatting of this note might be different from the original. no traction    Past Surgical History:  Procedure Laterality Date   APPENDECTOMY  1984   CARDIAC CATHETERIZATION  ~ 2006   CAROTID ANGIOGRAPHY Left 08/20/2018   Procedure: CAROTID ANGIOGRAPHY;  Surgeon: Serafina Mitchell, MD;  Location: [facility];  Service: Cardiovascular;  Laterality: Left;   CAROTID ENDARTERECTOMY Right 05/10/07   cea   CATARACT EXTRACTION Left    CATARACT EXTRACTION W/ INTRAOCULAR LENS  IMPLANT, BILATERAL Bilateral    CORONARY ANGIOPLASTY WITH STENT PLACEMENT  07/18/2018   CORONARY STENT INTERVENTION N/A 07/18/2018   Procedure: CORONARY STENT INTERVENTION;  Surgeon: Lorretta Harp, MD;  Location: [facility];  Service: Cardiovascular;  Laterality: N/A;   INTRAVASCULAR PRESSURE WIRE/FFR STUDY N/A 07/18/2018   Procedure: INTRAVASCULAR PRESSURE WIRE/FFR STUDY;  Surgeon: Lorretta Harp, MD;  Location: [facility];  Service: Cardiovascular;  Laterality: N/A;   JOINT REPLACEMENT     LEFT HEART CATH AND CORONARY ANGIOGRAPHY N/A 07/18/2018   Procedure: LEFT HEART CATH AND CORONARY ANGIOGRAPHY;  Surgeon: Lorretta Harp, MD;  Location: [facility];  Service: Cardiovascular;  Laterality: N/A;   MULTIPLE TOOTH EXTRACTIONS     TOTAL KNEE ARTHROPLASTY Bilateral 2005 - 2011   right - left   VAGINAL HYSTERECTOMY  1984   WISDOM TOOTH EXTRACTION      Family History  Problem Relation Age of Onset   Stroke Mother        mini stroke   Kidney disease Mother    Heart failure Mother    Hypertension Mother    Diabetes Sister        type 2   Kidney disease Sister    Allergic rhinitis Sister    Heart attack Maternal Grandfather    Hyperlipidemia Sister    Colon cancer Neg Hx     Social History   Socioeconomic History   Marital status: Divorced    Spouse name: Not on file   Number of children: 2   Years of education: Not on file    Highest education level: Not on file  Occupational History   Not on file  Tobacco Use   Smoking status: Former    Packs/day: 2.00    Years: 30.00    Pack years: 60.00    Types: Cigarettes    Quit date: 12/03/1994    Years since quitting: 26.2   Smokeless tobacco: Never  Vaping Use   Vaping Use: Never used  Substance and Sexual Activity   Alcohol use: Yes    Comment: 07/18/2018 "1 drink q 1-2 months; if that"   Drug use: Never   Sexual activity: Not Currently    Comment: lives with son and friend, no dietary restrictions  Other Topics Concern   Not on file  Social History Narrative   Right handed   No caffeine   One story home   Social Determinants of Health   Financial Resource Strain: Not on file  Food Insecurity: Not on file  Transportation Needs: Not on file  Physical Activity: Not on file  Stress: Not on file  Social Connections: Not on file  Intimate Partner Violence: Not on file    Outpatient Medications Prior to Visit  Medication Sig Dispense Refill   acetaminophen (TYLENOL) 500 MG tablet Take 500 mg by mouth as needed.     albuterol (VENTOLIN HFA) 108 (90 Base) MCG/ACT inhaler Inhale 2 puffs into the lungs every 6 (six) hours as needed for wheezing or shortness of breath. 18 g 3   aMILoride (MIDAMOR) 5 MG tablet Take 5 mg by mouth daily.     amLODipine (NORVASC) 2.5 MG tablet Take 1 tablet (2.5 mg total) by mouth daily. 90 tablet 1   aspirin EC 81 MG tablet Take 1 tablet (81 mg total) by mouth daily. 90 tablet 3   atorvastatin (LIPITOR) 10 MG tablet TAKE ONE TABLET BY MOUTH DAILY 90 tablet 1   BREZTRI AEROSPHERE 160-9-4.8 MCG/ACT AERO INHALE TWO PUFFS BY MOUTH TWICE A DAY 10.7 g 0   Calcium Citrate-Vitamin D (CALCIUM + D PO) Take 1 tablet by mouth daily.  Calcium Citrate-Vitamin D 200-250 MG-UNIT TABS Take 1 tablet by mouth daily.     carvedilol (COREG) 6.25 MG tablet Take 1 tablet (6.25 mg total) by mouth 2 (two) times daily with a meal. 180 tablet 1    Cholecalciferol 25 MCG (1000 UT) tablet Take 1,000 Units by mouth daily.     Choline Fenofibrate (FENOFIBRIC ACID) 135 MG CPDR Take 1 capsule by mouth daily. 90 capsule 1   clonazePAM (KLONOPIN) 1 MG tablet TAKE ONE TABLET BY MOUTH THREE TIMES A DAY AS NEEDED FOR ANXIETY 90 tablet 1   clopidogrel (PLAVIX) 75 MG tablet Take 1 tablet (75 mg total) by mouth daily. 90 tablet 3   cyanocobalamin 100 MCG tablet Take 500 mcg by mouth daily.     ezetimibe (ZETIA) 10 MG tablet TAKE ONE TABLET BY MOUTH DAILY 90 tablet 1   famotidine (PEPCID) 40 MG tablet Take 1 tablet (40 mg total) by mouth daily. 90 tablet 1   FLUoxetine (PROZAC) 20 MG tablet TAKE ONE TABLET BY MOUTH DAILY 90 tablet 1   gabapentin (NEURONTIN) 100 MG capsule Take 2 capsules (200 mg total) by mouth at bedtime. 180 capsule 1   levothyroxine (SYNTHROID) 112 MCG tablet TAKE ONE TABLET BY MOUTH DAILY 90 tablet 1   montelukast (SINGULAIR) 10 MG tablet Take 1 tablet (10 mg total) by mouth at bedtime as needed. 90 tablet 3   Multiple Vitamins-Minerals (PRESERVISION AREDS 2 PO) Take 1 capsule by mouth daily.     Omega-3 Fatty Acids (FISH OIL PO) Take 1 capsule by mouth daily.     ondansetron (ZOFRAN ODT) 4 MG disintegrating tablet Take 1 tablet (4 mg total) by mouth every 8 (eight) hours as needed for nausea or vomiting. 20 tablet 0   Probiotic Product (VSL#3) CAPS Take 1 capsule by mouth daily.     traMADol (ULTRAM) 50 MG tablet Take 1 tablet (50 mg total) by mouth every 8 (eight) hours as needed for moderate pain or severe pain. 90 tablet 0   No facility-administered medications prior to visit.    Allergies  Allergen Reactions   Niaspan [Niacin Er] Nausea And Vomiting and Swelling    Swelling in mouth   Pantoprazole     Mouth sores   Bupropion Other (See Comments)    Uncontrollable shakes   Nitroglycerin     NOT allergic - cardiology has recommended she NOT use this due to h/o severe carotid disease as it may decrease cerebral perfusion    Penicillins Hives    DID THE REACTION INVOLVE: Swelling of the face/tongue/throat, SOB, or low BP? Yes Sudden or severe rash/hives, skin peeling, or the inside of the mouth or nose? Unknown Did it require medical treatment? Unknown When did it last happen?   [age]  If all above answers are "NO", may proceed with cephalosporin use.   Statins    Sulfonamide Derivatives Hives   Apple Rash   Banana Rash    Review of Systems  Constitutional:  Positive for malaise/fatigue. Negative for chills and fever.  HENT:  Negative for congestion.   Eyes:  Negative for blurred vision.  Respiratory:  Negative for shortness of breath.   Cardiovascular:  Negative for chest pain, palpitations and leg swelling.  Gastrointestinal:  Positive for abdominal pain, diarrhea and nausea. Negative for blood in stool, constipation and melena.  Genitourinary:  Negative for dysuria and frequency.  Musculoskeletal:  Positive for myalgias. Negative for falls.  Skin:  Negative for rash.  Neurological:  Positive  for dizziness, weakness and headaches. Negative for loss of consciousness.  Endo/Heme/Allergies:  Negative for environmental allergies.  Psychiatric/Behavioral:  Negative for depression. The patient is not nervous/anxious.       Objective:    Physical Exam Constitutional:      General: She is not in acute distress.    Appearance: She is ill-appearing. She is not diaphoretic.  HENT:     Head: Normocephalic and atraumatic.     Right Ear: External ear normal.     Left Ear: External ear normal.     Nose: Nose normal.     Mouth/Throat:     Pharynx: No oropharyngeal exudate.  Eyes:     General: No scleral icterus.       Right eye: No discharge.        Left eye: No discharge.     Conjunctiva/sclera: Conjunctivae normal.     Pupils: Pupils are equal, round, and reactive to light.     Comments: Eyes sunken  Neck:     Thyroid: No thyromegaly.  Cardiovascular:     Rate and Rhythm: Normal rate and regular  rhythm.     Heart sounds: Normal heart sounds. No murmur heard. Pulmonary:     Effort: Pulmonary effort is normal. No respiratory distress.     Breath sounds: Normal breath sounds. No wheezing or rales.  Abdominal:     General: Bowel sounds are normal. There is no distension.     Palpations: Abdomen is soft. There is no mass.     Tenderness: There is abdominal tenderness. There is no rebound.  Musculoskeletal:        General: No tenderness. Normal range of motion.     Cervical back: Normal range of motion and neck supple.  Lymphadenopathy:     Cervical: No cervical adenopathy.  Skin:    General: Skin is warm and dry.     Findings: No rash.  Neurological:     Mental Status: She is alert and oriented to person, place, and time.     Cranial Nerves: No cranial nerve deficit.     Coordination: Coordination normal.     Deep Tendon Reflexes: Reflexes are normal and symmetric. Reflexes normal.    BP (!) 72/46 (BP Location: Left Arm, Cuff Size: Large) Comment: manual bp  Pulse 71   Temp 98.8 F (37.1 C) (Oral)   Resp 12   SpO2 96%  Wt Readings from Last 3 Encounters:  02/15/21 180 lb (81.6 kg)  11/11/20 181 lb (82.1 kg)  11/10/20 181 lb 3.2 oz (82.2 kg)    Diabetic Foot Exam - Simple   No data filed    Lab Results  Component Value Date   WBC 6.8 02/17/2021   HGB 12.7 02/17/2021   HCT 38.5 02/17/2021   PLT 226.0 02/17/2021   GLUCOSE 89 02/17/2021   CHOL 116 11/10/2020   TRIG 114.0 11/10/2020   HDL 50.10 11/10/2020   LDLDIRECT 103.0 07/14/2019   LDLCALC 43 11/10/2020   ALT 17 02/17/2021   AST 26 02/17/2021   NA 140 02/17/2021   K 3.8 02/17/2021   CL 105 02/17/2021   CREATININE 2.07 (H) 02/17/2021   BUN 22 02/17/2021   CO2 25 02/17/2021   TSH 0.58 11/18/2020   INR 1.79 07/18/2018   HGBA1C 5.3 11/10/2020    Lab Results  Component Value Date   TSH 0.58 11/18/2020   Lab Results  Component Value Date   WBC 6.8 02/17/2021   HGB 12.7 02/17/2021  HCT 38.5  02/17/2021   MCV 88.8 02/17/2021   PLT 226.0 02/17/2021   Lab Results  Component Value Date   NA 140 02/17/2021   K 3.8 02/17/2021   CO2 25 02/17/2021   GLUCOSE 89 02/17/2021   BUN 22 02/17/2021   CREATININE 2.07 (H) 02/17/2021   BILITOT 0.5 02/17/2021   ALKPHOS 34 (L) 02/17/2021   AST 26 02/17/2021   ALT 17 02/17/2021   PROT 6.0 02/17/2021   ALBUMIN 3.6 02/17/2021   CALCIUM 9.7 02/17/2021   ANIONGAP 13 02/15/2021   GFR 23.43 (L) 02/17/2021   Lab Results  Component Value Date   CHOL 116 11/10/2020   Lab Results  Component Value Date   HDL 50.10 11/10/2020   Lab Results  Component Value Date   LDLCALC 43 11/10/2020   Lab Results  Component Value Date   TRIG 114.0 11/10/2020   Lab Results  Component Value Date   CHOLHDL 2 11/10/2020   Lab Results  Component Value Date   HGBA1C 5.3 11/10/2020       Assessment & Plan:   Problem List Items Addressed This Visit     CKD (chronic kidney disease), stage IV ([place])    Hydrate and monitor. She is currently dehydrated and after a lengthy discussion with her and her son. They decide she will return home for now and push fluids with some Powerade and water, if she is unable to hydrate adequately they will take her to the ER. Repeat labs next week.        Relevant Orders   Urinalysis   Hypothyroidism - Primary   Relevant Orders   Urinalysis   Hyperlipidemia, mixed   Relevant Orders   Urinalysis   Essential hypertension    Blood pressure is low today. She will hold her Carvedilol and Amlodipine for the next 5 days and then restart her Carvedilol only for now as they monitor her BP closely.        Relevant Orders   Urinalysis   Hyperglycemia   Relevant Orders   Urinalysis   Gastroenteritis    She is mostly noting several loose stool daily over the past few days. Some nausea also noted. Recent visit to ER also showed gallstones so she is also referred to general surgery for consideration of cholecystectomy and  she is advised to maintain a bland, low fat, low spice diet and drink increased fluids. Seek care if worsens       COPD with asthma ([place])    No recent flares.        Other Visit Diagnoses     Diarrhea, unspecified type       Relevant Orders   Clostridium difficile EIA   Ova and parasite examination   Stool Culture   Stool, WBC/Lactoferrin   Urinalysis   Abdominal pain, unspecified abdominal location       Relevant Orders   CBC with Differential/Platelet (Completed)   Comprehensive metabolic panel (Completed)   Sedimentation rate (Completed)   Ambulatory referral to General Surgery   Clostridium difficile EIA   Ova and parasite examination   Stool Culture   Stool, WBC/Lactoferrin   Urinalysis   Urine Culture   Calculus of gallbladder with biliary obstruction but without cholecystitis       Relevant Orders   Ambulatory referral to General Surgery       I am having Emily Mitchell start on ondansetron. I am also having her maintain her aspirin EC, Omega-3 Fatty Acids (  FISH OIL PO), Calcium Citrate-Vitamin D (CALCIUM + D PO), acetaminophen, aMILoride, albuterol, clopidogrel, montelukast, VSL#3, Calcium Citrate-Vitamin D, Cholecalciferol, cyanocobalamin, Multiple Vitamins-Minerals (PRESERVISION AREDS 2 PO), ezetimibe, FLUoxetine, levothyroxine, clonazePAM, famotidine, traMADol, gabapentin, atorvastatin, Fenofibric Acid, carvedilol, Breztri Aerosphere, amLODipine, and ondansetron.  Meds ordered this encounter  Medications   ondansetron (ZOFRAN) 4 MG tablet    Sig: Take 1 tablet (4 mg total) by mouth every 8 (eight) hours as needed for nausea or vomiting.    Dispense:  20 tablet    Refill:  0     Penni Homans, MD

## 2021-02-20 NOTE — Assessment & Plan Note (Signed)
She is mostly noting several loose stool daily over the past few days. Some nausea also noted. Recent visit to ER also showed gallstones so she is also referred to general surgery for consideration of cholecystectomy and she is advised to maintain a bland, low fat, low spice diet and drink increased fluids. Seek care if worsens

## 2021-02-21 NOTE — Addendum Note (Signed)
Addended by: CREFT, Kristine Garbe L on: 02/21/2021 11:43 AM   Modules accepted: Orders

## 2021-02-24 ENCOUNTER — Other Ambulatory Visit: Payer: Self-pay

## 2021-02-24 ENCOUNTER — Other Ambulatory Visit (INDEPENDENT_AMBULATORY_CARE_PROVIDER_SITE_OTHER): Payer: Medicare Other

## 2021-02-24 DIAGNOSIS — R7989 Other specified abnormal findings of blood chemistry: Secondary | ICD-10-CM | POA: Diagnosis not present

## 2021-02-25 ENCOUNTER — Other Ambulatory Visit: Payer: Self-pay

## 2021-02-25 ENCOUNTER — Telehealth: Payer: Self-pay | Admitting: Pulmonary Disease

## 2021-02-25 DIAGNOSIS — E782 Mixed hyperlipidemia: Secondary | ICD-10-CM

## 2021-02-25 DIAGNOSIS — I10 Essential (primary) hypertension: Secondary | ICD-10-CM

## 2021-02-25 LAB — COMPREHENSIVE METABOLIC PANEL
ALT: 17 U/L (ref 0–35)
AST: 20 U/L (ref 0–37)
Albumin: 3.6 g/dL (ref 3.5–5.2)
Alkaline Phosphatase: 43 U/L (ref 39–117)
BUN: 19 mg/dL (ref 6–23)
CO2: 30 mEq/L (ref 19–32)
Calcium: 9.8 mg/dL (ref 8.4–10.5)
Chloride: 106 mEq/L (ref 96–112)
Creatinine, Ser: 1.87 mg/dL — ABNORMAL HIGH (ref 0.40–1.20)
GFR: 26.47 mL/min — ABNORMAL LOW (ref >60.00)
Glucose, Bld: 95 mg/dL (ref 70–99)
Potassium: 4.6 mEq/L (ref 3.5–5.1)
Sodium: 143 mEq/L (ref 135–145)
Total Bilirubin: 0.4 mg/dL (ref 0.2–1.2)
Total Protein: 5.9 g/dL — ABNORMAL LOW (ref 6.0–8.3)

## 2021-02-25 MED ORDER — BREZTRI AEROSPHERE 160-9-4.8 MCG/ACT IN AERO: INHALATION_SPRAY | RESPIRATORY_TRACT | 0 refills | Status: DC

## 2021-02-25 NOTE — Telephone Encounter (Signed)
Call made to pharmacy, confirmed patient info. Pharmacy states it was not rejected they were told she needed an appt before she could obtain further refills.   Call made to patient, made aware a 1 month refill would be sent in until she makes her appt on August 11th. Voiced understanding. Refill sent.   Nothing further needed at this time.

## 2021-03-07 DIAGNOSIS — N189 Chronic kidney disease, unspecified: Secondary | ICD-10-CM | POA: Diagnosis not present

## 2021-03-07 DIAGNOSIS — N390 Urinary tract infection, site not specified: Secondary | ICD-10-CM | POA: Diagnosis not present

## 2021-03-07 DIAGNOSIS — N1832 Chronic kidney disease, stage 3b: Secondary | ICD-10-CM | POA: Diagnosis not present

## 2021-03-09 ENCOUNTER — Telehealth: Payer: Self-pay

## 2021-03-09 NOTE — Telephone Encounter (Signed)
Called and spoke with patient, patient does not use a CPAP machine.

## 2021-03-10 ENCOUNTER — Encounter: Payer: Self-pay | Admitting: Adult Health

## 2021-03-10 ENCOUNTER — Ambulatory Visit (INDEPENDENT_AMBULATORY_CARE_PROVIDER_SITE_OTHER): Payer: Medicare Other

## 2021-03-10 ENCOUNTER — Other Ambulatory Visit: Payer: Self-pay

## 2021-03-10 ENCOUNTER — Ambulatory Visit: Payer: Medicare Other | Admitting: Adult Health

## 2021-03-10 VITALS — BP 90/64 | HR 83 | Temp 98.3°F | Ht 63.0 in | Wt 181.8 lb

## 2021-03-10 DIAGNOSIS — J449 Chronic obstructive pulmonary disease, unspecified: Secondary | ICD-10-CM

## 2021-03-10 DIAGNOSIS — J310 Chronic rhinitis: Secondary | ICD-10-CM | POA: Diagnosis not present

## 2021-03-10 MED ORDER — SPACER/AERO-HOLDING CHAMBERS DEVI
0 refills | Status: DC
Start: 2021-03-10 — End: 2021-06-10

## 2021-03-10 MED ORDER — BREZTRI AEROSPHERE 160-9-4.8 MCG/ACT IN AERO: 2.0000 | INHALATION_SPRAY | Freq: Two times a day (BID) | RESPIRATORY_TRACT | 3 refills | Status: DC

## 2021-03-10 NOTE — Progress Notes (Signed)
Cst   @Patient  ID: Emily Mitchell, female    DOB: Sep 29, 1945, 75 y.o.   MRN: [id number]  Chief Complaint  Patient presents with   Follow-up    Referring provider: Mosie Lukes, MD  HPI: 75 year old female former heavy smoker followed for COPD with asthma Medical history is significant for pulmonary embolism in 2014 on Coumadin until 2019 History of obstructive sleep apnea very mild in 2015 she lost a significant amount of weight and came off of CPAP. She does have chronic kidney disease stage IV followed by nephrology.  And a history of essential tremors  TEST/EVENTS :  PSG at [facility] 08/2011 -217 lbs -AHI 4/h, TST 319 mins,supine    PSG 07/2013 -208 lbs -showed mild OSA TST - 390 mins,  AHI  6/h, RDI of 8/h The lowest desaturation was 87%      Spirometry 04/2013: FEV1 72% predicted FEV1 FVC ratio 73 Spirometry  10/2017  severe airway obstruction with ratio 60, FEV1 of 41% and FVC of 52%    Echo 08/2014 grade 2 DD   12/2014 ONO - no desatn >> dc O2  03/10/2021 Follow up : COPD with Asthma  Patient returns for a follow-up visit.  Last seen June 2021.  Patient has underlying severe COPD with asthma.  She says overall her breathing is doing She remains on BREZTRI inhaler twice daily. Rarely uses her albuterol inhaler. No flare of cough or wheezing . Says it is hard to use inhaler due to her tremor. We discussed adding spacer.  She tries to remain active. Does housework. Rides stationary bike 2 miles a day .  Patient says allergies have been doing okay.  Is on Singulair daily. She has a history of heavy smoking.  Quit in 1996.  Chest x-ray August 2020 showed no acute process. Fully vaccinated for Covid 19 x 4 .   Patient has chronic kidney disease follows with nephrology. Not on dialysis .   Admitted last month with severe diarrhea and vomiting . Found to have gallstones. Has upcoming general surgery consult .    Allergies  Allergen Reactions   Niaspan [Niacin Er] Nausea And  Vomiting and Swelling    Swelling in mouth   Pantoprazole     Mouth sores   Sulfonamide Derivatives Hives   Bupropion Other (See Comments)    Uncontrollable shakes   Nitroglycerin     NOT allergic - cardiology has recommended she NOT use this due to h/o severe carotid disease as it may decrease cerebral perfusion   Penicillins Hives    DID THE REACTION INVOLVE: Swelling of the face/tongue/throat, SOB, or low BP? Yes Sudden or severe rash/hives, skin peeling, or the inside of the mouth or nose? Unknown Did it require medical treatment? Unknown When did it last happen?   teen  If all above answers are "NO", may proceed with cephalosporin use.   Statins    Apple Rash   Banana Rash    Immunization History  Administered Date(s) Administered   Fluad Quad(high Dose 65+) 04/17/2019, 04/14/2020   Influenza Split 04/29/2013   Influenza, High Dose Seasonal PF 03/30/2016, 04/19/2017, 05/02/2018   Influenza,inj,Quad PF,6+ Mos 03/26/2014, 05/17/2015   PFIZER(Purple Top)SARS-COV-2 Vaccination 10/19/2019, 11/10/2019, 05/08/2020, 10/30/2020   Pneumococcal Conjugate-13 04/29/2013   Pneumococcal Polysaccharide-23 04/30/2014, 04/17/2019   Tdap 05/26/2013   Zoster Recombinat (Shingrix) 04/27/2017, 06/13/2017   Zoster, Live 07/31/2012    Past Medical History:  Diagnosis Date   Acid reflux    Anemia 07/05/2014  Anxiety and depression 12/14/2008   Qualifier: Diagnosis of  By: [name] LPN, Almyra Free     Arthritis    "fingers, toes, knees, joints" (07/18/2018)   Asthma    Atypical chest pain 05/23/2015   Back pain 02/02/2019   Breast mass, left 04/28/2019   CAD (coronary artery disease)    a. [place] with cath 06/2018 s/p DES to LAD.   CAD in native artery 07/19/2018   Candidal skin infection 02/28/2018   Carotid artery disease ([place]) 06/16/2015   S/p CEA    Carotid artery occlusion    a. R carotid occluded, 47-82% LICA.   Carotid bruit 02/23/2020   Chest pain 07/18/2018   CHF (congestive heart failure)  (HCC)    Chicken pox as a child   Chronic kidney disease    Bright's Disease at age 60    Chronic obstructive pulmonary disease ([place]) 12/14/2008   Formatting of this note might be different from the original. Overview:  Spirometry 05/12/2013: FEV1 72% predicted FEV1 FVC ratio 73 predicted FEF 25 75  43% predicted  Last Assessment & Plan:  OK to stop taking advair Use albuterol as needed only Check oxygen levels during sleep - we will call you with results Call if symptoms worse   Chronic rhinitis 06/14/2018   CKD (chronic kidney disease), stage IV ([place])    Follows with [facility], Dr Alonza Smoker    Congestive heart failure ([place]) 11/30/2015   Formatting of this note might be different from the original. Last Assessment & Plan:  No recent exacerbation   Constipation 12/17/2008   Qualifier: Diagnosis of  By: Westly Pam    COPD (chronic obstructive pulmonary disease) (HCC)    COPD with asthma ([place]) 11/06/2017   Essential hypertension 10/18/2015   Formatting of this note might be different from the original. Last Assessment & Plan:  poorlycontrolled and under a great deal of stress, add Metoprolol XL 25 mg daily. Encouraged heart healthy diet such as the DASH diet and exercise as tolerated.   Essential tremor 11/05/2013   Exudative age-related macular degeneration of right eye with active choroidal neovascularization ([place]) 08/26/2015   Gastroesophageal reflux disease 12/14/2008   Formatting of this note might be different from the original. Overview:  Qualifier: Diagnosis of  By: [name] LPN, Almyra Free   Last Assessment & Plan:  Avoid offending foods, start probiotics. Do not eat large meals in late evening and consider raising head of bed.   GERD (gastroesophageal reflux disease)    H. pylori infection 10/19/2013   H/O multiple allergies 95/62/1308   Helicobacter pylori (H. pylori) as the cause of diseases classified elsewhere 10/19/2013   Formatting of this note might be different from the  original. Last Assessment & Plan:  Tetracycline, Pepto Bismol, Flagyl and Omeprazole.   Hepatitis 1970s   "don't know for sure which one it was; think it was A" (07/18/2018)   History of food allergy 08/14/2018   History of pulmonary embolism 07/17/2018   Hoarseness 03/09/2019   Hypercalcemia 03/09/2019   Hyperglycemia 03/14/2016   Hyperlipidemia    Hyperlipidemia, mixed 05/26/2013   Crestor caused N/V Did not tolerate Lipitor    Hypertension    Hypothyroidism    Knee pain, bilateral 12/17/2012   Left hip pain 05/23/2015   Low back pain 09/19/2015   Lower urinary tract infectious disease 10/19/2013   Lymphadenopathy 10/18/2017   Macular degeneration of both eyes 12/16/2015   Right eye is wet Left Eye is dry Shots  every 11 to 12 weeks in right eye at opthamologist office   Medicare annual wellness visit, subsequent 02/21/2015   Mixed anxiety depressive disorder 12/14/2008   Formatting of this note might be different from the original. Overview:  Qualifier: Diagnosis of  By: [name] LPN, Almyra Free  Patient had increased  Tremors on Bupropion  Last Assessment & Plan:  Is struggling with the stress of her 79 year old granddaughter whom she raised moving back with her own mother, the patient is estranged from her daughter so she is very upset. Increase Citalopram 40 mg daily    Mumps 75 yrs old   Neck pain 04/22/2017   Numbness of finger 12/17/2012   Occlusion and stenosis of carotid artery without mention of cerebral infarction 12/17/2012   OSA (obstructive sleep apnea) 12/14/2008   PSG  06/2013-AHI 6/h 08/2011 ([facility]) AHI 4/h No longer needs CPAP    Pneumonia    "at least once" (07/18/2018)   Preventative health care 03/14/2016   Pulmonary emboli ([place]) 05/26/2013   Renal insufficiency 07/17/2018   Right shoulder pain 10/18/2017   Sensation of foreign body in larynx 04/08/2019   Sleep apnea    "don't use CPAP anymore; dr said I didn't have to" (07/18/2018)   Static tremor 11/05/2013   Formatting of this note  might be different from the original. Last Assessment & Plan:  Follows with [facility]   Statin intolerance 09/29/2019   Stroke [facility])    "been told I've had some strokes; didn't know I'd had them" (07/18/2018)   Tremor    UTI (lower urinary tract infection) 10/19/2013   Vitamin D deficiency 03/03/2018   Vitreomacular adhesion of both eyes 01/07/2019   Formatting of this note might be different from the original. no traction    Tobacco History: Social History   Tobacco Use  Smoking Status Former   Packs/day: 2.00   Years: 30.00   Pack years: 60.00   Types: Cigarettes   Quit date: 12/03/1994   Years since quitting: 26.2  Smokeless Tobacco Never   Counseling given: Not Answered   Outpatient Medications Prior to Visit  Medication Sig Dispense Refill   acetaminophen (TYLENOL) 500 MG tablet Take 500 mg by mouth as needed.     albuterol (VENTOLIN HFA) 108 (90 Base) MCG/ACT inhaler Inhale 2 puffs into the lungs every 6 (six) hours as needed for wheezing or shortness of breath. 18 g 3   aspirin EC 81 MG tablet Take 1 tablet (81 mg total) by mouth daily. 90 tablet 3   atorvastatin (LIPITOR) 10 MG tablet TAKE ONE TABLET BY MOUTH DAILY 90 tablet 1   Calcium Citrate-Vitamin D (CALCIUM + D PO) Take 1 tablet by mouth daily.     Calcium Citrate-Vitamin D 200-250 MG-UNIT TABS Take 1 tablet by mouth daily.     carvedilol (COREG) 6.25 MG tablet Take 1 tablet (6.25 mg total) by mouth 2 (two) times daily with a meal. 180 tablet 1   Cholecalciferol 25 MCG (1000 UT) tablet Take 1,000 Units by mouth daily.     Choline Fenofibrate (FENOFIBRIC ACID) 135 MG CPDR Take 1 capsule by mouth daily. 90 capsule 1   clonazePAM (KLONOPIN) 1 MG tablet TAKE ONE TABLET BY MOUTH THREE TIMES A DAY AS NEEDED FOR ANXIETY 90 tablet 1   clopidogrel (PLAVIX) 75 MG tablet Take 1 tablet (75 mg total) by mouth daily. 90 tablet 3   cyanocobalamin 100 MCG tablet Take 500 mcg by mouth daily.  ezetimibe (ZETIA) 10 MG tablet TAKE ONE  TABLET BY MOUTH DAILY 90 tablet 1   famotidine (PEPCID) 40 MG tablet Take 1 tablet (40 mg total) by mouth daily. 90 tablet 1   FLUoxetine (PROZAC) 20 MG tablet TAKE ONE TABLET BY MOUTH DAILY 90 tablet 1   gabapentin (NEURONTIN) 100 MG capsule Take 2 capsules (200 mg total) by mouth at bedtime. 180 capsule 1   levothyroxine (SYNTHROID) 112 MCG tablet TAKE ONE TABLET BY MOUTH DAILY 90 tablet 1   montelukast (SINGULAIR) 10 MG tablet Take 1 tablet (10 mg total) by mouth at bedtime as needed. 90 tablet 3   Multiple Vitamins-Minerals (PRESERVISION AREDS 2 PO) Take 1 capsule by mouth daily.     NON FORMULARY Take 1 tablet by mouth daily. Digestive advantage.     Omega-3 Fatty Acids (FISH OIL PO) Take 1 capsule by mouth daily.     Probiotic Product (VSL#3) CAPS Take 1 capsule by mouth daily.     traMADol (ULTRAM) 50 MG tablet Take 1 tablet (50 mg total) by mouth every 8 (eight) hours as needed for moderate pain or severe pain. 90 tablet 0   Budeson-Glycopyrrol-Formoterol (BREZTRI AEROSPHERE) 160-9-4.8 MCG/ACT AERO INHALE TWO PUFFS BY MOUTH TWICE A DAY 10.7 g 0   amLODipine (NORVASC) 2.5 MG tablet Take 1 tablet (2.5 mg total) by mouth daily. (Patient not taking: Reported on 03/10/2021) 90 tablet 1   aMILoride (MIDAMOR) 5 MG tablet Take 5 mg by mouth daily. (Patient not taking: Reported on 03/10/2021)     ondansetron (ZOFRAN ODT) 4 MG disintegrating tablet Take 1 tablet (4 mg total) by mouth every 8 (eight) hours as needed for nausea or vomiting. (Patient not taking: Reported on 03/10/2021) 20 tablet 0   ondansetron (ZOFRAN) 4 MG tablet Take 1 tablet (4 mg total) by mouth every 8 (eight) hours as needed for nausea or vomiting. (Patient not taking: Reported on 03/10/2021) 20 tablet 0   No facility-administered medications prior to visit.     Review of Systems:   Constitutional:   No  weight loss, night sweats,  Fevers, chills,  +fatigue, or  lassitude.  HEENT:   No headaches,  Difficulty swallowing,   Tooth/dental problems, or  Sore throat,                No sneezing, itching, ear ache, nasal congestion, post nasal drip,   CV:  No chest pain,  Orthopnea, PND, swelling in lower extremities, anasarca, dizziness, palpitations, syncope.   GI  No heartburn, indigestion, abdominal pain, nausea, vomiting, diarrhea, change in bowel habits, loss of appetite, bloody stools.   Resp:   No excess mucus, no productive cough,  No non-productive cough,  No coughing up of blood.  No change in color of mucus.  No wheezing.  No chest wall deformity  Skin: no rash or lesions.  GU: no dysuria, change in color of urine, no urgency or frequency.  No flank pain, no hematuria   MS:  No joint pain or swelling.  No decreased range of motion.  No back pain.    Physical Exam  BP 90/64 (BP Location: Left Arm, Patient Position: Sitting, Cuff Size: Normal)   Pulse 83   Temp 98.3 F (36.8 C) (Oral)   Ht 5\' 3"  (1.6 m)   Wt 181 lb 12.8 oz (82.5 kg)   SpO2 96%   BMI 32.20 kg/m   GEN: A/Ox3; pleasant , NAD, well nourished    HEENT:  [place]/AT,  NOSE-clear,  THROAT-clear, no lesions, no postnasal drip or exudate noted.   NECK:  Supple w/ fair ROM; no JVD; normal carotid impulses w/o bruits; no thyromegaly or nodules palpated; no lymphadenopathy.    RESP  Clear  P & A; w/o, wheezes/ rales/ or rhonchi. no accessory muscle use, no dullness to percussion  CARD:  RRR, no m/r/g, no peripheral edema, pulses intact, no cyanosis or clubbing.  GI:   Soft & nt; nml bowel sounds; no organomegaly or masses detected.   Musco: Warm bil, no deformities or joint swelling noted.   Neuro: alert, no focal deficits noted.    Skin: Warm, no lesions or rashes    Lab Results:  CBC    Component Value Date/Time   WBC 6.8 02/17/2021 1459   RBC 4.34 02/17/2021 1459   HGB 12.7 02/17/2021 1459   HGB 12.3 07/17/2018 1508   HCT 38.5 02/17/2021 1459   HCT 37.2 07/17/2018 1508   PLT 226.0 02/17/2021 1459   PLT 275 07/17/2018  1508   MCV 88.8 02/17/2021 1459   MCV 87 07/17/2018 1508   MCH 30.0 02/15/2021 1018   MCHC 33.1 02/17/2021 1459   RDW 13.6 02/17/2021 1459   RDW 13.2 07/17/2018 1508   LYMPHSABS 0.9 02/17/2021 1459   LYMPHSABS 2.0 07/17/2018 1508   MONOABS 0.8 02/17/2021 1459   EOSABS 0.1 02/17/2021 1459   EOSABS 0.2 07/17/2018 1508   BASOSABS 0.0 02/17/2021 1459   BASOSABS 0.1 07/17/2018 1508    BMET    Component Value Date/Time   NA 143 02/24/2021 1334   NA 140 07/17/2018 1508   K 4.6 02/24/2021 1334   CL 106 02/24/2021 1334   CO2 30 02/24/2021 1334   GLUCOSE 95 02/24/2021 1334   BUN 19 02/24/2021 1334   BUN 37 (H) 07/17/2018 1508   CREATININE 1.87 (H) 02/24/2021 1334   CREATININE 2.45 (H) 12/12/2013 1521   CALCIUM 9.8 02/24/2021 1334   CALCIUM 9.5 11/25/2009 1346   GFRNONAA 34 (L) 02/15/2021 1018   GFRAA 32 (L) 03/15/2019 2039    BNP    Component Value Date/Time   BNP 38.9 09/04/2018 2258    ProBNP    Component Value Date/Time   PROBNP 60.7 02/25/2007 1515    Imaging:     No flowsheet data found.  No results found for: NITRICOXIDE      Assessment & Plan:   Chronic obstructive pulmonary disease (HCC) Severe COPD with Asthma - compensated on present regimen  Check chest xray .  Albuterol As needed    Plan  Patient Instructions  Chest x-ray today Continue on BREZTRI 2 puffs Twice daily, rinse after use.  Singulair daily  Albuterol inhaler As needed   Follow up with Dr. Elsworth Soho or Zyara Riling NP in 6 months As needed        Chronic rhinitis Controlled  Continue on current regimen .     I spent   30 minutes dedicated to the care of this patient on the date of this encounter to include pre-visit review of records, face-to-face time with the patient discussing conditions above, post visit ordering of testing, clinical documentation with the electronic health record, making appropriate referrals as documented, and communicating necessary findings to members of  the patients care team.   Rexene Edison, NP 03/10/2021

## 2021-03-10 NOTE — Assessment & Plan Note (Signed)
Controlled  Continue on current regimen .

## 2021-03-10 NOTE — Assessment & Plan Note (Signed)
Severe COPD with Asthma - compensated on present regimen  Check chest xray .  Albuterol As needed    Plan  Patient Instructions  Chest x-ray today Continue on BREZTRI 2 puffs Twice daily, rinse after use.  Singulair daily  Albuterol inhaler As needed   Follow up with Dr. Elsworth Soho or Ahaan Zobrist NP in 6 months As needed

## 2021-03-10 NOTE — Patient Instructions (Addendum)
Chest x-ray today Continue on BREZTRI 2 puffs Twice daily, rinse after use.  Add spacer to BREZTRI .  Singulair daily  Albuterol inhaler As needed   Follow up with Dr. Elsworth Soho or Charo Philipp NP in 6 months As needed

## 2021-03-17 DIAGNOSIS — R251 Tremor, unspecified: Secondary | ICD-10-CM | POA: Diagnosis not present

## 2021-03-17 DIAGNOSIS — I129 Hypertensive chronic kidney disease with stage 1 through stage 4 chronic kidney disease, or unspecified chronic kidney disease: Secondary | ICD-10-CM | POA: Diagnosis not present

## 2021-03-17 DIAGNOSIS — N2581 Secondary hyperparathyroidism of renal origin: Secondary | ICD-10-CM | POA: Diagnosis not present

## 2021-03-17 DIAGNOSIS — N184 Chronic kidney disease, stage 4 (severe): Secondary | ICD-10-CM | POA: Diagnosis not present

## 2021-03-17 DIAGNOSIS — I509 Heart failure, unspecified: Secondary | ICD-10-CM | POA: Diagnosis not present

## 2021-03-17 DIAGNOSIS — N179 Acute kidney failure, unspecified: Secondary | ICD-10-CM | POA: Diagnosis not present

## 2021-03-17 DIAGNOSIS — E785 Hyperlipidemia, unspecified: Secondary | ICD-10-CM | POA: Diagnosis not present

## 2021-03-17 DIAGNOSIS — D631 Anemia in chronic kidney disease: Secondary | ICD-10-CM | POA: Diagnosis not present

## 2021-03-23 DIAGNOSIS — K8010 Calculus of gallbladder with chronic cholecystitis without obstruction: Secondary | ICD-10-CM | POA: Diagnosis not present

## 2021-03-24 ENCOUNTER — Ambulatory Visit (INDEPENDENT_AMBULATORY_CARE_PROVIDER_SITE_OTHER): Payer: Medicare Other | Admitting: Family Medicine

## 2021-03-24 ENCOUNTER — Ambulatory Visit (INDEPENDENT_AMBULATORY_CARE_PROVIDER_SITE_OTHER): Payer: Medicare Other

## 2021-03-24 ENCOUNTER — Other Ambulatory Visit: Payer: Self-pay

## 2021-03-24 VITALS — BP 124/80 | HR 81 | Temp 98.2°F | Resp 18 | Wt 179.2 lb

## 2021-03-24 VITALS — Ht 63.0 in

## 2021-03-24 DIAGNOSIS — E079 Disorder of thyroid, unspecified: Secondary | ICD-10-CM | POA: Diagnosis not present

## 2021-03-24 DIAGNOSIS — R739 Hyperglycemia, unspecified: Secondary | ICD-10-CM | POA: Diagnosis not present

## 2021-03-24 DIAGNOSIS — N189 Chronic kidney disease, unspecified: Secondary | ICD-10-CM | POA: Diagnosis not present

## 2021-03-24 DIAGNOSIS — K219 Gastro-esophageal reflux disease without esophagitis: Secondary | ICD-10-CM

## 2021-03-24 DIAGNOSIS — D649 Anemia, unspecified: Secondary | ICD-10-CM | POA: Diagnosis not present

## 2021-03-24 DIAGNOSIS — Z780 Asymptomatic menopausal state: Secondary | ICD-10-CM | POA: Diagnosis not present

## 2021-03-24 DIAGNOSIS — Z0000 Encounter for general adult medical examination without abnormal findings: Secondary | ICD-10-CM | POA: Diagnosis not present

## 2021-03-24 DIAGNOSIS — I10 Essential (primary) hypertension: Secondary | ICD-10-CM | POA: Diagnosis not present

## 2021-03-24 DIAGNOSIS — E559 Vitamin D deficiency, unspecified: Secondary | ICD-10-CM

## 2021-03-24 DIAGNOSIS — L989 Disorder of the skin and subcutaneous tissue, unspecified: Secondary | ICD-10-CM | POA: Diagnosis not present

## 2021-03-24 DIAGNOSIS — H43823 Vitreomacular adhesion, bilateral: Secondary | ICD-10-CM

## 2021-03-24 DIAGNOSIS — N289 Disorder of kidney and ureter, unspecified: Secondary | ICD-10-CM

## 2021-03-24 DIAGNOSIS — Z789 Other specified health status: Secondary | ICD-10-CM

## 2021-03-24 DIAGNOSIS — E782 Mixed hyperlipidemia: Secondary | ICD-10-CM

## 2021-03-24 NOTE — Progress Notes (Signed)
Patient ID: Emily Mitchell, female    DOB: 09-24-1945  Age: 75 y.o. MRN: [id number]    Subjective:  Subjective  HPI Emily Mitchell presents for office visit today for follow up on COPD and hypothyroidism. She is expressing concern regarding a cyst on her left flank that she noticed a year ago. She endorses feeling itchy, but no pain or drainage. Denies CP/palp/SOB/HA/congestion/fevers/GI or GU c/o. Taking meds as prescribed.   She reports that she does not need to take her BP meds since her last sickness. When she was sick, her BP was dropping too low and had to stop her medication. However, when she restarted her medication after recovering she still experienced low BP. As a result, she stopped taking it and since then she has been feeling better. She endorses exercising everyday on a bike for 2-3 miles. She reports that her LE edema has been improving and therefore does not need compression socks.  Expresses interest in doing cologuard instead of colonoscopy and endorses taking her vitamin supplements.   Review of Systems  Constitutional:  Negative for chills, fatigue and fever.  HENT:  Negative for congestion, rhinorrhea, sinus pressure, sinus pain and sore throat.   Eyes:  Negative for pain.  Respiratory:  Negative for cough and shortness of breath.   Cardiovascular:  Negative for chest pain, palpitations and leg swelling.  Gastrointestinal:  Negative for abdominal pain, blood in stool, diarrhea, nausea and vomiting.  Genitourinary:  Negative for decreased urine volume, flank pain, frequency, vaginal bleeding and vaginal discharge.  Musculoskeletal:  Negative for back pain.  Skin:        +lesion at left low back  Neurological:  Negative for headaches.   History Past Medical History:  Diagnosis Date   Acid reflux    Anemia 07/05/2014   Anxiety and depression 12/14/2008   Qualifier: Diagnosis of  By: [name] LPN, Almyra Free     Arthritis    "fingers, toes, knees, joints" (07/18/2018)    Asthma    Atypical chest pain 05/23/2015   Back pain 02/02/2019   Breast mass, left 04/28/2019   CAD (coronary artery disease)    a. [place] with cath 06/2018 s/p DES to LAD.   CAD in native artery 07/19/2018   Candidal skin infection 02/28/2018   Carotid artery disease ([place]) 06/16/2015   S/p CEA    Carotid artery occlusion    a. R carotid occluded, 19-37% LICA.   Carotid bruit 02/23/2020   Chest pain 07/18/2018   CHF (congestive heart failure) (HCC)    Chicken pox as a child   Chronic kidney disease    Bright's Disease at age 61    Chronic obstructive pulmonary disease ([place]) 12/14/2008   Formatting of this note might be different from the original. Overview:  Spirometry 05/12/2013: FEV1 72% predicted FEV1 FVC ratio 73 predicted FEF 25 75  43% predicted  Last Assessment & Plan:  OK to stop taking advair Use albuterol as needed only Check oxygen levels during sleep - we will call you with results Call if symptoms worse   Chronic rhinitis 06/14/2018   CKD (chronic kidney disease), stage IV ([place])    Follows with [facility], Dr Alonza Smoker    Congestive heart failure ([place]) 11/30/2015   Formatting of this note might be different from the original. Last Assessment & Plan:  No recent exacerbation   Constipation 12/17/2008   Qualifier: Diagnosis of  By: Westly Pam    COPD (chronic obstructive  pulmonary disease) ([place])    COPD with asthma ([place]) 11/06/2017   Essential hypertension 10/18/2015   Formatting of this note might be different from the original. Last Assessment & Plan:  poorlycontrolled and under a great deal of stress, add Metoprolol XL 25 mg daily. Encouraged heart healthy diet such as the DASH diet and exercise as tolerated.   Essential tremor 11/05/2013   Exudative age-related macular degeneration of right eye with active choroidal neovascularization ([place]) 08/26/2015   Gastroesophageal reflux disease 12/14/2008   Formatting of this note might be different from the original.  Overview:  Qualifier: Diagnosis of  By: [name] LPN, Almyra Free   Last Assessment & Plan:  Avoid offending foods, start probiotics. Do not eat large meals in late evening and consider raising head of bed.   GERD (gastroesophageal reflux disease)    H. pylori infection 10/19/2013   H/O multiple allergies 65/99/3570   Helicobacter pylori (H. pylori) as the cause of diseases classified elsewhere 10/19/2013   Formatting of this note might be different from the original. Last Assessment & Plan:  Tetracycline, Pepto Bismol, Flagyl and Omeprazole.   Hepatitis 1970s   "don't know for sure which one it was; think it was A" (07/18/2018)   History of food allergy 08/14/2018   History of pulmonary embolism 07/17/2018   Hoarseness 03/09/2019   Hypercalcemia 03/09/2019   Hyperglycemia 03/14/2016   Hyperlipidemia    Hyperlipidemia, mixed 05/26/2013   Crestor caused N/V Did not tolerate Lipitor    Hypertension    Hypothyroidism    Knee pain, bilateral 12/17/2012   Left hip pain 05/23/2015   Low back pain 09/19/2015   Lower urinary tract infectious disease 10/19/2013   Lymphadenopathy 10/18/2017   Macular degeneration of both eyes 12/16/2015   Right eye is wet Left Eye is dry Shots every 11 to 12 weeks in right eye at opthamologist office   Medicare annual wellness visit, subsequent 02/21/2015   Mixed anxiety depressive disorder 12/14/2008   Formatting of this note might be different from the original. Overview:  Qualifier: Diagnosis of  By: [name] LPN, Almyra Free  Patient had increased  Tremors on Bupropion  Last Assessment & Plan:  Is struggling with the stress of her 44 year old granddaughter whom she raised moving back with her own mother, the patient is estranged from her daughter so she is very upset. Increase Citalopram 40 mg daily    Mumps 75 yrs old   Neck pain 04/22/2017   Numbness of finger 12/17/2012   Occlusion and stenosis of carotid artery without mention of cerebral infarction 12/17/2012   OSA (obstructive sleep  apnea) 12/14/2008   PSG  06/2013-AHI 6/h 08/2011 ([facility]) AHI 4/h No longer needs CPAP    Pneumonia    "at least once" (07/18/2018)   Preventative health care 03/14/2016   Pulmonary emboli ([place]) 05/26/2013   Renal insufficiency 07/17/2018   Right shoulder pain 10/18/2017   Sensation of foreign body in larynx 04/08/2019   Sleep apnea    "don't use CPAP anymore; dr said I didn't have to" (07/18/2018)   Static tremor 11/05/2013   Formatting of this note might be different from the original. Last Assessment & Plan:  Follows with [facility]   Statin intolerance 09/29/2019   Stroke [facility])    "been told I've had some strokes; didn't know I'd had them" (07/18/2018)   Tremor    UTI (lower urinary tract infection) 10/19/2013   Vitamin D deficiency 03/03/2018   Vitreomacular adhesion of both  eyes 01/07/2019   Formatting of this note might be different from the original. no traction    She has a past surgical history that includes Carotid endarterectomy (Right, 05/10/07); Wisdom tooth extraction; Cataract extraction (Left); Total knee arthroplasty (Bilateral, 2005 - 2011); Joint replacement; Appendectomy (1984); Vaginal hysterectomy (1984); Cataract extraction w/ intraocular lens  implant, bilateral (Bilateral); Multiple tooth extractions; Coronary angioplasty with stent (07/18/2018); Cardiac catheterization (~ 2006); LEFT HEART CATH AND CORONARY ANGIOGRAPHY (N/A, 07/18/2018); INTRAVASCULAR PRESSURE WIRE/FFR STUDY (N/A, 07/18/2018); CORONARY STENT INTERVENTION (N/A, 07/18/2018); and CAROTID ANGIOGRAPHY (Left, 08/20/2018).   Her family history includes Allergic rhinitis in her sister; Diabetes in her sister; Heart attack in her maternal grandfather; Heart failure in her mother; Hyperlipidemia in her sister; Hypertension in her mother; Kidney disease in her mother and sister; Stroke in her mother.She reports that she quit smoking about 26 years ago. Her smoking use included cigarettes. She has a 60.00 pack-year smoking  history. She has never used smokeless tobacco. She reports current alcohol use. She reports that she does not use drugs.  Current Outpatient Medications on File Prior to Visit  Medication Sig Dispense Refill   acetaminophen (TYLENOL) 500 MG tablet Take 500 mg by mouth as needed.     albuterol (VENTOLIN HFA) 108 (90 Base) MCG/ACT inhaler Inhale 2 puffs into the lungs every 6 (six) hours as needed for wheezing or shortness of breath. 18 g 3   aspirin EC 81 MG tablet Take 1 tablet (81 mg total) by mouth daily. 90 tablet 3   atorvastatin (LIPITOR) 10 MG tablet TAKE ONE TABLET BY MOUTH DAILY 90 tablet 1   Budeson-Glycopyrrol-Formoterol (BREZTRI AEROSPHERE) 160-9-4.8 MCG/ACT AERO Inhale 2 puffs into the lungs 2 (two) times daily. INHALE TWO PUFFS BY MOUTH TWICE A DAY 3 g 3   Calcium Citrate-Vitamin D (CALCIUM + D PO) Take 1 tablet by mouth daily.     Calcium Citrate-Vitamin D 200-250 MG-UNIT TABS Take 1 tablet by mouth daily.     Cholecalciferol 25 MCG (1000 UT) tablet Take 1,000 Units by mouth daily.     Choline Fenofibrate (FENOFIBRIC ACID) 135 MG CPDR Take 1 capsule by mouth daily. 90 capsule 1   clonazePAM (KLONOPIN) 1 MG tablet TAKE ONE TABLET BY MOUTH THREE TIMES A DAY AS NEEDED FOR ANXIETY 90 tablet 1   clopidogrel (PLAVIX) 75 MG tablet Take 1 tablet (75 mg total) by mouth daily. 90 tablet 3   cyanocobalamin 100 MCG tablet Take 500 mcg by mouth daily.     ezetimibe (ZETIA) 10 MG tablet TAKE ONE TABLET BY MOUTH DAILY 90 tablet 1   famotidine (PEPCID) 40 MG tablet Take 1 tablet (40 mg total) by mouth daily. 90 tablet 1   FLUoxetine (PROZAC) 20 MG tablet TAKE ONE TABLET BY MOUTH DAILY 90 tablet 1   gabapentin (NEURONTIN) 100 MG capsule Take 2 capsules (200 mg total) by mouth at bedtime. 180 capsule 1   levothyroxine (SYNTHROID) 112 MCG tablet TAKE ONE TABLET BY MOUTH DAILY 90 tablet 1   montelukast (SINGULAIR) 10 MG tablet Take 1 tablet (10 mg total) by mouth at bedtime as needed. 90 tablet 3    Multiple Vitamins-Minerals (PRESERVISION AREDS 2 PO) Take 1 capsule by mouth daily.     NON FORMULARY Take 1 tablet by mouth daily. Digestive advantage.     Omega-3 Fatty Acids (FISH OIL PO) Take 1 capsule by mouth daily.     Probiotic Product (VSL#3) CAPS Take 1 capsule by mouth daily.  Spacer/Aero-Holding Dorise Bullion Use with inhaler. 1 each 0   traMADol (ULTRAM) 50 MG tablet Take 1 tablet (50 mg total) by mouth every 8 (eight) hours as needed for moderate pain or severe pain. 90 tablet 0   amLODipine (NORVASC) 2.5 MG tablet Take 1 tablet (2.5 mg total) by mouth daily. (Patient not taking: Reported on 03/24/2021) 90 tablet 1   carvedilol (COREG) 6.25 MG tablet Take 1 tablet (6.25 mg total) by mouth 2 (two) times daily with a meal. (Patient not taking: Reported on 03/24/2021) 180 tablet 1   No current facility-administered medications on file prior to visit.     Objective:  Objective  Physical Exam Constitutional:      General: She is not in acute distress.    Appearance: Normal appearance. She is not ill-appearing or toxic-appearing.  HENT:     Head: Normocephalic and atraumatic.     Right Ear: Tympanic membrane, ear canal and external ear normal.     Left Ear: Tympanic membrane, ear canal and external ear normal.     Nose: No congestion or rhinorrhea.  Eyes:     Extraocular Movements: Extraocular movements intact.     Pupils: Pupils are equal, round, and reactive to light.  Cardiovascular:     Rate and Rhythm: Normal rate and regular rhythm.     Pulses: Normal pulses.     Heart sounds: Normal heart sounds. No murmur heard. Pulmonary:     Effort: Pulmonary effort is normal. No respiratory distress.     Breath sounds: Normal breath sounds. No wheezing, rhonchi or rales.  Abdominal:     General: Bowel sounds are normal.     Palpations: Abdomen is soft. There is no mass.     Tenderness: There is no abdominal tenderness. There is no guarding.     Hernia: No hernia is present.   Musculoskeletal:        General: Normal range of motion.     Cervical back: Normal range of motion and neck supple.  Skin:    General: Skin is warm and dry.     Findings: Lesion (raised and pearly lesion local to left low back) present.  Neurological:     Mental Status: She is alert and oriented to person, place, and time.  Psychiatric:        Behavior: Behavior normal.   BP 124/80 (BP Location: Left Arm, Patient Position: Sitting, Cuff Size: Normal)   Pulse 81   Temp 98.2 F (36.8 C) (Oral)   Resp 18   Wt 179 lb 3.2 oz (81.3 kg)   SpO2 98%   BMI 31.74 kg/m  Wt Readings from Last 3 Encounters:  03/24/21 179 lb 3.2 oz (81.3 kg)  03/10/21 181 lb 12.8 oz (82.5 kg)  02/15/21 180 lb (81.6 kg)     Lab Results  Component Value Date   WBC 7.6 03/24/2021   HGB 12.6 03/24/2021   HCT 38.6 03/24/2021   PLT 295.0 03/24/2021   GLUCOSE 80 03/24/2021   CHOL 138 03/24/2021   TRIG 143.0 03/24/2021   HDL 50.60 03/24/2021   LDLDIRECT 103.0 07/14/2019   LDLCALC 59 03/24/2021   ALT 14 03/24/2021   AST 21 03/24/2021   NA 141 03/24/2021   K 4.7 03/24/2021   CL 105 03/24/2021   CREATININE 1.89 (H) 03/24/2021   BUN 33 (H) 03/24/2021   CO2 27 03/24/2021   TSH 0.14 (L) 03/24/2021   INR 1.79 07/18/2018   HGBA1C 5.3 03/24/2021  CT Abdomen Pelvis Wo Contrast  Result Date: 02/15/2021 CLINICAL DATA:  Abdominal pain, nausea, vomiting, and diarrhea for 2 days, history stage IV chronic kidney disease, COPD, coronary artery disease, CHF, hypertension, stroke, former smoker EXAM: CT ABDOMEN AND PELVIS WITHOUT CONTRAST TECHNIQUE: Multidetector CT imaging of the abdomen and pelvis was performed following the standard protocol without IV contrast. Sagittal and coronal MPR images reconstructed from axial data set. No oral contrast administered. COMPARISON:  02/09/2006 FINDINGS: Lower chest: Lung bases clear Hepatobiliary: Multiple calcified gallstones in gallbladder. Gallbladder normally distended  without wall thickening or surrounding inflammatory changes by CT. Liver normal appearance. Pancreas: Normal appearance Spleen: Normal appearance Adrenals/Urinary Tract: Adrenal glands, kidneys, ureters, and bladder normal appearance Stomach/Bowel: Sigmoid diverticulosis without evidence of diverticulitis. Food debris in stomach, stomach otherwise unremarkable. Remaining bowel loops normal appearance. Appendix surgically absent by history. Vascular/Lymphatic: Atherosclerotic calcifications aorta and iliac arteries without aneurysm. No adenopathy. Scattered pelvic phleboliths. Reproductive: Uterus surgically absent.  Atrophic ovaries. Other: No free air or free fluid. No hernia or inflammatory process. Musculoskeletal: Diffuse osseous demineralization. Degenerative facet disease changes lumbar spine. Diffusely bulging L4-L5 and L5-S1 discs with AP narrowing of spinal canal at L4-L5. IMPRESSION: Cholelithiasis. Sigmoid diverticulosis without evidence of diverticulitis. No acute intra-abdominal or intrapelvic abnormalities. Spinal stenosis L4-L5. Aortic Atherosclerosis (ICD10-I70.0). Electronically Signed   By: Lavonia Dana M.D.   On: 02/15/2021 13:43   US Abdomen Limited RUQ (LIVER/GB)  Result Date: 02/15/2021 CLINICAL DATA:  Right upper quadrant abdominal pain for 2 days EXAM: ULTRASOUND ABDOMEN LIMITED RIGHT UPPER QUADRANT COMPARISON:  CT scan 02/15/2021 FINDINGS: Gallbladder: Multiple gallstones are observed measuring up to 1.9 cm in long axis. Sonographic Murphy's sign absent. No gallbladder wall thickening or pericholecystic fluid. Common bile duct: Diameter: 0.6 cm, within normal limits. No directly visualized choledocholithiasis. Liver: No focal lesion identified. Within normal limits in parenchymal echogenicity. Portal vein is patent on color Doppler imaging with normal direction of blood flow towards the liver. Other: None. IMPRESSION: 1. Cholelithiasis. No gallbladder wall thickening or pericholecystic  fluid. No dilation of the common bile duct. Electronically Signed   By: Van Clines M.D.   On: 02/15/2021 14:54     Assessment & Plan:  Plan    No orders of the defined types were placed in this encounter.   Problem List Items Addressed This Visit     Thyroid disease    Monitor labs      Relevant Orders   TSH (Completed)   T4, free (Completed)   Hyperlipidemia, mixed - Primary    Encourage heart healthy diet such as MIND or DASH diet, increase exercise, avoid trans fats, simple carbohydrates and processed foods, consider a krill or fish or flaxseed oil cap daily.       Relevant Orders   Lipid panel (Completed)   Anemia   Essential hypertension    Well controlled, no changes to meds. Encouraged heart healthy diet such as the DASH diet and exercise as tolerated.       Hyperglycemia    hgba1c acceptable, minimize simple carbs. Increase exercise as tolerated.       Relevant Orders   Hemoglobin A1c (Completed)   Vitamin D deficiency    Supplement and monitor      Relevant Orders   VITAMIN D 25 Hydroxy (Vit-D Deficiency, Fractures) (Completed)   Renal insufficiency    Hydrate and monitor      Statin intolerance    Does not tolerate statins, Encourage heart healthy diet such as  MIND or DASH diet, increase exercise, avoid trans fats, simple carbohydrates and processed foods, consider a krill or fish or flaxseed oil cap daily.       Relevant Orders   Hemoglobin A1c (Completed)   Chronic kidney disease    Hydrate and monitor      Hypertension    Well controlled, she has stopped her bp med and is feeling much better. Encouraged heart healthy diet such as the DASH diet and exercise as tolerated.       Vitreomacular adhesion of both eyes   Relevant Orders   CBC (Completed)   Comprehensive metabolic panel (Completed)   TSH (Completed)   GERD (gastroesophageal reflux disease)    Avoid offending foods, start probiotics. Do not eat large meals in late evening  and consider raising head of bed.       Skin lesion of back    Referred to dermatology for further evaluation      Relevant Orders   Ambulatory referral to Dermatology    Follow-up: Return in about 6 months (around 09/24/2021) for annual exam.  I, Suezanne Jacquet, acting as a scribe for Penni Homans, MD, have documented all relevent documentation on behalf of Penni Homans, MD, as directed by Penni Homans, MD while in the presence of Penni Homans, MD.  I, Mosie Lukes, MD personally performed the services described in this documentation. All medical record entries made by the scribe were at my direction and in my presence. I have reviewed the chart and agree that the record reflects my personal performance and is accurate and complete

## 2021-03-24 NOTE — Progress Notes (Signed)
Subjective:   Emily Mitchell is a 75 y.o. female who presents for Medicare Annual (Subsequent) preventive examination.  Review of Systems     Cardiac Risk Factors include: advanced age (>97men, >25 women);hypertension;dyslipidemia;obesity (BMI >30kg/m2)     Objective:    Today's Vitals   03/24/21 1419  Height: 5\' 3"  (1.6 m)   Body mass index is 31.74 kg/m.  Advanced Directives 03/24/2021 02/15/2021 06/03/2020 01/02/2020 09/30/2019 05/22/2019 03/15/2019  Does Patient Have a Medical Advance Directive? Yes Yes No;Yes Yes Yes Yes No  Type of Paramedic of [place];Living will - - - - Press photographer;Living will -  Does patient want to make changes to medical advance directive? - - - - - No - Patient declined -  Copy of [place] in Chart? Yes - validated most recent copy scanned in chart (See row information) - - - - Yes - validated most recent copy scanned in chart (See row information) -  Would patient like information on creating a medical advance directive? - - - - - - No - Patient declined    Current Medications (verified) Outpatient Encounter Medications as of 03/24/2021  Medication Sig   acetaminophen (TYLENOL) 500 MG tablet Take 500 mg by mouth as needed.   albuterol (VENTOLIN HFA) 108 (90 Base) MCG/ACT inhaler Inhale 2 puffs into the lungs every 6 (six) hours as needed for wheezing or shortness of breath.   amLODipine (NORVASC) 2.5 MG tablet Take 1 tablet (2.5 mg total) by mouth daily. (Patient not taking: Reported on 03/24/2021)   aspirin EC 81 MG tablet Take 1 tablet (81 mg total) by mouth daily.   atorvastatin (LIPITOR) 10 MG tablet TAKE ONE TABLET BY MOUTH DAILY   Budeson-Glycopyrrol-Formoterol (BREZTRI AEROSPHERE) 160-9-4.8 MCG/ACT AERO Inhale 2 puffs into the lungs 2 (two) times daily. INHALE TWO PUFFS BY MOUTH TWICE A DAY   Calcium Citrate-Vitamin D (CALCIUM + D PO) Take 1 tablet by mouth daily.   Calcium  Citrate-Vitamin D 200-250 MG-UNIT TABS Take 1 tablet by mouth daily.   carvedilol (COREG) 6.25 MG tablet Take 1 tablet (6.25 mg total) by mouth 2 (two) times daily with a meal. (Patient not taking: Reported on 03/24/2021)   Cholecalciferol 25 MCG (1000 UT) tablet Take 1,000 Units by mouth daily.   Choline Fenofibrate (FENOFIBRIC ACID) 135 MG CPDR Take 1 capsule by mouth daily.   clonazePAM (KLONOPIN) 1 MG tablet TAKE ONE TABLET BY MOUTH THREE TIMES A DAY AS NEEDED FOR ANXIETY   clopidogrel (PLAVIX) 75 MG tablet Take 1 tablet (75 mg total) by mouth daily.   cyanocobalamin 100 MCG tablet Take 500 mcg by mouth daily.   ezetimibe (ZETIA) 10 MG tablet TAKE ONE TABLET BY MOUTH DAILY   famotidine (PEPCID) 40 MG tablet Take 1 tablet (40 mg total) by mouth daily.   FLUoxetine (PROZAC) 20 MG tablet TAKE ONE TABLET BY MOUTH DAILY   gabapentin (NEURONTIN) 100 MG capsule Take 2 capsules (200 mg total) by mouth at bedtime.   levothyroxine (SYNTHROID) 112 MCG tablet TAKE ONE TABLET BY MOUTH DAILY   montelukast (SINGULAIR) 10 MG tablet Take 1 tablet (10 mg total) by mouth at bedtime as needed.   Multiple Vitamins-Minerals (PRESERVISION AREDS 2 PO) Take 1 capsule by mouth daily.   NON FORMULARY Take 1 tablet by mouth daily. Digestive advantage.   Omega-3 Fatty Acids (FISH OIL PO) Take 1 capsule by mouth daily.   Probiotic Product (VSL#3) CAPS Take 1  capsule by mouth daily.   Spacer/Aero-Holding Dorise Bullion Use with inhaler.   traMADol (ULTRAM) 50 MG tablet Take 1 tablet (50 mg total) by mouth every 8 (eight) hours as needed for moderate pain or severe pain.   No facility-administered encounter medications on file as of 03/24/2021.    Allergies (verified) Niaspan [niacin er], Pantoprazole, Sulfonamide derivatives, Bupropion, Nitroglycerin, Penicillins, Statins, Apple, and Banana   History: Past Medical History:  Diagnosis Date   Acid reflux    Anemia 07/05/2014   Anxiety and depression 12/14/2008    Qualifier: Diagnosis of  By: [name] LPN, Almyra Free     Arthritis    "fingers, toes, knees, joints" (07/18/2018)   Asthma    Atypical chest pain 05/23/2015   Back pain 02/02/2019   Breast mass, left 04/28/2019   CAD (coronary artery disease)    a. [place] with cath 06/2018 s/p DES to LAD.   CAD in native artery 07/19/2018   Candidal skin infection 02/28/2018   Carotid artery disease ([place]) 06/16/2015   S/p CEA    Carotid artery occlusion    a. R carotid occluded, 84-13% LICA.   Carotid bruit 02/23/2020   Chest pain 07/18/2018   CHF (congestive heart failure) (HCC)    Chicken pox as a child   Chronic kidney disease    Bright's Disease at age 68    Chronic obstructive pulmonary disease ([place]) 12/14/2008   Formatting of this note might be different from the original. Overview:  Spirometry 05/12/2013: FEV1 72% predicted FEV1 FVC ratio 73 predicted FEF 25 75  43% predicted  Last Assessment & Plan:  OK to stop taking advair Use albuterol as needed only Check oxygen levels during sleep - we will call you with results Call if symptoms worse   Chronic rhinitis 06/14/2018   CKD (chronic kidney disease), stage IV ([place])    Follows with [facility], Dr Alonza Smoker    Congestive heart failure ([place]) 11/30/2015   Formatting of this note might be different from the original. Last Assessment & Plan:  No recent exacerbation   Constipation 12/17/2008   Qualifier: Diagnosis of  By: Westly Pam    COPD (chronic obstructive pulmonary disease) (HCC)    COPD with asthma ([place]) 11/06/2017   Essential hypertension 10/18/2015   Formatting of this note might be different from the original. Last Assessment & Plan:  poorlycontrolled and under a great deal of stress, add Metoprolol XL 25 mg daily. Encouraged heart healthy diet such as the DASH diet and exercise as tolerated.   Essential tremor 11/05/2013   Exudative age-related macular degeneration of right eye with active choroidal neovascularization ([place]) 08/26/2015    Gastroesophageal reflux disease 12/14/2008   Formatting of this note might be different from the original. Overview:  Qualifier: Diagnosis of  By: [name] LPN, Almyra Free   Last Assessment & Plan:  Avoid offending foods, start probiotics. Do not eat large meals in late evening and consider raising head of bed.   GERD (gastroesophageal reflux disease)    H. pylori infection 10/19/2013   H/O multiple allergies 24/40/1027   Helicobacter pylori (H. pylori) as the cause of diseases classified elsewhere 10/19/2013   Formatting of this note might be different from the original. Last Assessment & Plan:  Tetracycline, Pepto Bismol, Flagyl and Omeprazole.   Hepatitis 1970s   "don't know for sure which one it was; think it was A" (07/18/2018)   History of food allergy 08/14/2018   History of pulmonary embolism 07/17/2018  Hoarseness 03/09/2019   Hypercalcemia 03/09/2019   Hyperglycemia 03/14/2016   Hyperlipidemia    Hyperlipidemia, mixed 05/26/2013   Crestor caused N/V Did not tolerate Lipitor    Hypertension    Hypothyroidism    Knee pain, bilateral 12/17/2012   Left hip pain 05/23/2015   Low back pain 09/19/2015   Lower urinary tract infectious disease 10/19/2013   Lymphadenopathy 10/18/2017   Macular degeneration of both eyes 12/16/2015   Right eye is wet Left Eye is dry Shots every 11 to 12 weeks in right eye at opthamologist office   Medicare annual wellness visit, subsequent 02/21/2015   Mixed anxiety depressive disorder 12/14/2008   Formatting of this note might be different from the original. Overview:  Qualifier: Diagnosis of  By: [name] LPN, Almyra Free  Patient had increased  Tremors on Bupropion  Last Assessment & Plan:  Is struggling with the stress of her 86 year old granddaughter whom she raised moving back with her own mother, the patient is estranged from her daughter so she is very upset. Increase Citalopram 40 mg daily    Mumps 75 yrs old   Neck pain 04/22/2017   Numbness of finger 12/17/2012   Occlusion  and stenosis of carotid artery without mention of cerebral infarction 12/17/2012   OSA (obstructive sleep apnea) 12/14/2008   PSG  06/2013-AHI 6/h 08/2011 (bethany) AHI 4/h No longer needs CPAP    Pneumonia    "at least once" (07/18/2018)   Preventative health care 03/14/2016   Pulmonary emboli ([place]) 05/26/2013   Renal insufficiency 07/17/2018   Right shoulder pain 10/18/2017   Sensation of foreign body in larynx 04/08/2019   Sleep apnea    "don't use CPAP anymore; dr said I didn't have to" (07/18/2018)   Static tremor 11/05/2013   Formatting of this note might be different from the original. Last Assessment & Plan:  Follows with [facility]   Statin intolerance 09/29/2019   Stroke [facility])    "been told I've had some strokes; didn't know I'd had them" (07/18/2018)   Tremor    UTI (lower urinary tract infection) 10/19/2013   Vitamin D deficiency 03/03/2018   Vitreomacular adhesion of both eyes 01/07/2019   Formatting of this note might be different from the original. no traction   Past Surgical History:  Procedure Laterality Date   APPENDECTOMY  1984   CARDIAC CATHETERIZATION  ~ 2006   CAROTID ANGIOGRAPHY Left 08/20/2018   Procedure: CAROTID ANGIOGRAPHY;  Surgeon: Serafina Mitchell, MD;  Location: [place] [facility];  Service: Cardiovascular;  Laterality: Left;   CAROTID ENDARTERECTOMY Right 05/10/07   cea   CATARACT EXTRACTION Left    CATARACT EXTRACTION W/ INTRAOCULAR LENS  IMPLANT, BILATERAL Bilateral    CORONARY ANGIOPLASTY WITH STENT PLACEMENT  07/18/2018   CORONARY STENT INTERVENTION N/A 07/18/2018   Procedure: CORONARY STENT INTERVENTION;  Surgeon: Lorretta Harp, MD;  Location: [facility];  Service: Cardiovascular;  Laterality: N/A;   INTRAVASCULAR PRESSURE WIRE/FFR STUDY N/A 07/18/2018   Procedure: INTRAVASCULAR PRESSURE WIRE/FFR STUDY;  Surgeon: Lorretta Harp, MD;  Location: [place] [facility];  Service: Cardiovascular;  Laterality: N/A;   JOINT REPLACEMENT     LEFT HEART  CATH AND CORONARY ANGIOGRAPHY N/A 07/18/2018   Procedure: LEFT HEART CATH AND CORONARY ANGIOGRAPHY;  Surgeon: Lorretta Harp, MD;  Location: [place] [facility];  Service: Cardiovascular;  Laterality: N/A;   MULTIPLE TOOTH EXTRACTIONS     TOTAL KNEE ARTHROPLASTY Bilateral 2005 - 2011  right - left   VAGINAL HYSTERECTOMY  1984   WISDOM TOOTH EXTRACTION     Family History  Problem Relation Age of Onset   Stroke Mother        mini stroke   Kidney disease Mother    Heart failure Mother    Hypertension Mother    Diabetes Sister        type 2   Kidney disease Sister    Allergic rhinitis Sister    Heart attack Maternal Grandfather    Hyperlipidemia Sister    Colon cancer Neg Hx    Social History   Socioeconomic History   Marital status: Divorced    Spouse name: Not on file   Number of children: 2   Years of education: Not on file   Highest education level: Not on file  Occupational History   Not on file  Tobacco Use   Smoking status: Former    Packs/day: 2.00    Years: 30.00    Pack years: 60.00    Types: Cigarettes    Quit date: 12/03/1994    Years since quitting: 26.3   Smokeless tobacco: Never  Vaping Use   Vaping Use: Never used  Substance and Sexual Activity   Alcohol use: Yes    Comment: 07/18/2018 "1 drink q 1-2 months; if that"   Drug use: Never   Sexual activity: Not Currently    Comment: lives with son and friend, no dietary restrictions  Other Topics Concern   Not on file  Social History Narrative   Right handed   No caffeine   One story home   Social Determinants of Health   Financial Resource Strain: Low Risk    Difficulty of Paying Living Expenses: Not hard at all  Food Insecurity: No Food Insecurity   Worried About Charity fundraiser in the Last Year: Never true   [place] in the Last Year: Never true  Transportation Needs: No Transportation Needs   Lack of Transportation (Medical): No   Lack of Transportation (Non-Medical): No   Physical Activity: Insufficiently Active   Days of Exercise per Week: 7 days   Minutes of Exercise per Session: 20 min  Stress: No Stress Concern Present   Feeling of Stress : Not at all  Social Connections: Socially Isolated   Frequency of Communication with Friends and Family: More than three times a week   Frequency of Social Gatherings with Friends and Family: More than three times a week   Attends Religious Services: Never   Marine scientist or Organizations: No   Attends Music therapist: Never   Marital Status: Divorced    Tobacco Counseling Counseling given: Not Answered   Clinical Intake:  Pre-visit preparation completed: Yes  Pain : No/denies pain     Nutritional Status: BMI > 30  Obese Nutritional Risks: None Diabetes: No  How often do you need to have someone help you when you read instructions, pamphlets, or other written materials from your doctor or pharmacy?: 1 - Never  Diabetic?No  Interpreter Needed?: No  Information entered by :: Caroleen Hamman LPN   Activities of Daily Living In your present state of health, do you have any difficulty performing the following activities: 03/24/2021 02/17/2021  Hearing? N N  Vision? N N  Difficulty concentrating or making decisions? N N  Walking or climbing stairs? N Y  Dressing or bathing? N Y  Comment - sick at this time  Doing  errands, shopping? N Y  Comment - patient has someone to take her  Preparing Food and eating ? N -  Using the Toilet? N -  In the past six months, have you accidently leaked urine? N -  Do you have problems with loss of bowel control? N -  Managing your Medications? N -  Managing your Finances? N -  Housekeeping or managing your Housekeeping? N -  Some recent data might be hidden    Patient Care Team: Mosie Lukes, MD as PCP - General (Family Medicine) Revankar, Reita Cliche, MD as PCP - Cardiology (Cardiology) Rigoberto Noel, MD as Consulting Physician  (Pulmonary Disease) Donato Heinz, MD as Consulting Physician (Nephrology) Tat, Eustace Quail, DO as Consulting Physician (Neurology) Antionette Fairy Isaias Cowman, MD as Consulting Physician (Ophthalmology)  Indicate any recent Medical Services you may have received from other than [facility] providers in the past year (date may be approximate).     Assessment:   This is a routine wellness examination for [facility].  Hearing/Vision screen Hearing Screening - Comments:: C/o mild hearing loss Vision Screening - Comments:: Last eye exam-12/2020-Dr. Radford Pax  Dietary issues and exercise activities discussed: Current Exercise Habits: Home exercise routine, Type of exercise: Other - see comments (stationary bike), Time (Minutes): 20, Frequency (Times/Week): 7, Weekly Exercise (Minutes/Week): 140, Intensity: Mild, Exercise limited by: None identified   Goals Addressed             This Visit's Progress    Increase water intake   On track    Patient Stated       Eat healthier       Depression Screen PHQ 2/9 Scores 03/24/2021 03/24/2021 01/02/2020 05/22/2019 09/18/2017 01/16/2017 09/14/2016  PHQ - 2 Score 0 0 0 0 0 0 0    Fall Risk Fall Risk  03/24/2021 02/17/2021 01/02/2020 05/22/2019 09/18/2017  Falls in the past year? 1 1 0 0 No  Number falls in past yr: 1 1 0 0 -  Comment - - - - -  Injury with Fall? 0 0 0 0 -  Comment - - - - -  Risk Factor Category  - - - - -  Risk for fall due to : History of fall(s) - - - -  Follow up Falls prevention discussed - - - -    FALL RISK PREVENTION PERTAINING TO THE HOME:  Any stairs in or around the home? No  Home free of loose throw rugs in walkways, pet beds, electrical cords, etc? Yes  Adequate lighting in your home to reduce risk of falls? Yes   ASSISTIVE DEVICES UTILIZED TO PREVENT FALLS:  Life alert? No  Use of a cane, walker or w/c? No  Grab bars in the bathroom? Yes  Shower chair or bench in shower? No  Elevated toilet seat or a handicapped toilet? No    TIMED UP AND GO:  Was the test performed? Yes .  Length of time to ambulate 10 feet: 10 sec.   Gait slow and steady without use of assistive device  Cognitive Function: MMSE - Mini Mental State Exam 09/18/2017 09/14/2016  Orientation to time 5 5  Orientation to Place 5 5  Registration 3 3  Attention/ Calculation 5 5  Recall 2 3  Language- name 2 objects 2 2  Language- repeat 1 1  Language- follow 3 step command 3 3  Language- read & follow direction 1 1  Write a sentence 1 1  Copy design 1 1  Total  score 29 30        Immunizations Immunization History  Administered Date(s) Administered   Fluad Quad(high Dose 65+) 04/17/2019, 04/14/2020   Influenza Split 04/29/2013   Influenza, High Dose Seasonal PF 03/30/2016, 04/19/2017, 05/02/2018   Influenza,inj,Quad PF,6+ Mos 03/26/2014, 05/17/2015   PFIZER(Purple Top)SARS-COV-2 Vaccination 10/19/2019, 11/10/2019, 05/08/2020, 10/30/2020   Pneumococcal Conjugate-13 04/29/2013   Pneumococcal Polysaccharide-23 04/30/2014, 04/17/2019   Tdap 05/26/2013   Zoster Recombinat (Shingrix) 04/27/2017, 06/13/2017   Zoster, Live 07/31/2012    TDAP status: Up to date  Flu Vaccine status: Up to date  Pneumococcal vaccine status: Completed during today's visit.  Covid-19 vaccine status: Completed vaccines  Qualifies for Shingles Vaccine? No   Zostavax completed Yes   Shingrix Completed?: Yes  Screening Tests Health Maintenance  Topic Date Due   COLONOSCOPY (Pts 45-62yrs Insurance coverage will need to be confirmed)  09/25/2018   INFLUENZA VACCINE  02/28/2021   COVID-19 Vaccine (5 - Booster for [place] series) 07/09/2021 (Originally 03/01/2021)   MAMMOGRAM  04/10/2021   TETANUS/TDAP  05/27/2023   DEXA SCAN  Completed   Hepatitis C Screening  Completed   PNA vac Low Risk Adult  Completed   Zoster Vaccines- Shingrix  Completed   HPV VACCINES  Aged Out    Health Maintenance  Health Maintenance Due  Topic Date Due   COLONOSCOPY  (Pts 45-73yrs Insurance coverage will need to be confirmed)  09/25/2018   INFLUENZA VACCINE  02/28/2021    Colorectal cancer screening: Cologuard ordered by PCP today.  Mammogram status: Ordered previously. Pt provided with contact info and advised to call to schedule appt.   Bone Density status: Ordered oday. Pt provided with contact info and advised to call to schedule appt.  Lung Cancer Screening: (Low Dose CT Chest recommended if Age 19-80 years, 30 pack-year currently smoking OR have quit w/in 15years.) does not qualify.     Additional Screening:  Hepatitis C Screening: Completed 09/13/2015  Vision Screening: Recommended annual ophthalmology exams for early detection of glaucoma and other disorders of the eye. Is the patient up to date with their annual eye exam?  Yes  Who is the provider or what is the name of the office in which the patient attends annual eye exams? Dr. Radford Pax   Dental Screening: Recommended annual dental exams for proper oral hygiene  Community Resource Referral / Chronic Care Management: CRR required this visit?  No   CCM required this visit?  No      Plan:     I have personally reviewed and noted the following in the patient's chart:   Medical and social history Use of alcohol, tobacco or illicit drugs  Current medications and supplements including opioid prescriptions.  Functional ability and status Nutritional status Physical activity Advanced directives List of other physicians Hospitalizations, surgeries, and ER visits in previous 12 months Vitals Screenings to include cognitive, depression, and falls Referrals and appointments  In addition, I have reviewed and discussed with patient certain preventive protocols, quality metrics, and best practice recommendations. A written personalized care plan for preventive services as well as general preventive health recommendations were provided to patient.   Patient to access avs on  mychart  Marta Antu, [place]   6/97/[id number]  Nurse Health Advisor  Nurse Notes: None

## 2021-03-24 NOTE — Assessment & Plan Note (Signed)
Supplement and monitor 

## 2021-03-24 NOTE — Assessment & Plan Note (Signed)
Well controlled, she has stopped her bp med and is feeling much better. Encouraged heart healthy diet such as the DASH diet and exercise as tolerated.

## 2021-03-24 NOTE — Assessment & Plan Note (Signed)
Does not tolerate statins, Encourage heart healthy diet such as MIND or DASH diet, increase exercise, avoid trans fats, simple carbohydrates and processed foods, consider a krill or fish or flaxseed oil cap daily.

## 2021-03-24 NOTE — Patient Instructions (Addendum)
Flu shot next month Paxlovid is the new COVID medication we can give you if you get COVID so make sure you test if you have symptoms because we have to treat by day 5 of symptoms for it to be effective. If you are positive let us know so we can treat. If a home test is negative and your symptoms are persistent get a PCR test. Can check testing locations at [email or web address] If you are positive we will make an appointment with [place] and we will send in Paxlovid if you would like it. Check with your pharmacy before we meet to confirm they have it in stock, if they do not then we can get the prescription at the [facility]

## 2021-03-24 NOTE — Assessment & Plan Note (Signed)
Hydrate and monitor 

## 2021-03-24 NOTE — Patient Instructions (Signed)
Emily Mitchell , Thank you for taking time to come for your Medicare Wellness Visit. I appreciate your ongoing commitment to your health goals. Please review the following plan we discussed and let me know if I can assist you in the future.   Screening recommendations/referrals: Colonoscopy: Cologuard ordered by PCP today. Mammogram: Previously ordered by PCP. Someone will call you to schedule. Bone Density: Ordered today.Someone will call you to schedule Recommended yearly ophthalmology/optometry visit for glaucoma screening and checkup Recommended yearly dental visit for hygiene and checkup  Vaccinations: Influenza vaccine: Due Pneumococcal vaccine: Up to date Tdap vaccine: Up to date-Due-05/2023 Shingles vaccine: Completed vaccines   Covid-19:Up to date  Advanced directives: Copy in chart  Conditions/risks identified: See problem list  Next appointment: Follow up in one year for your annual wellness visit    Preventive Care 2 Years and Older, Female Preventive care refers to lifestyle choices and visits with your health care provider that can promote health and wellness. What does preventive care include? A yearly physical exam. This is also called an annual well check. Dental exams once or twice a year. Routine eye exams. Ask your health care provider how often you should have your eyes checked. Personal lifestyle choices, including: Daily care of your teeth and gums. Regular physical activity. Eating a healthy diet. Avoiding tobacco and drug use. Limiting alcohol use. Practicing safe sex. Taking low-dose aspirin every day. Taking vitamin and mineral supplements as recommended by your health care provider. What happens during an annual well check? The services and screenings done by your health care provider during your annual well check will depend on your age, overall health, lifestyle risk factors, and family history of disease. Counseling  Your health care provider may  ask you questions about your: Alcohol use. Tobacco use. Drug use. Emotional well-being. Home and relationship well-being. Sexual activity. Eating habits. History of falls. Memory and ability to understand (cognition). Work and work Statistician. Reproductive health. Screening  You may have the following tests or measurements: Height, weight, and BMI. Blood pressure. Lipid and cholesterol levels. These may be checked every 5 years, or more frequently if you are over 75 years old. Skin check. Lung cancer screening. You may have this screening every year starting at age 63 if you have a 30-pack-year history of smoking and currently smoke or have quit within the past 15 years. Fecal occult blood test (FOBT) of the stool. You may have this test every year starting at age 1. Flexible sigmoidoscopy or colonoscopy. You may have a sigmoidoscopy every 5 years or a colonoscopy every 10 years starting at age 22. Hepatitis C blood test. Hepatitis B blood test. Sexually transmitted disease (STD) testing. Diabetes screening. This is done by checking your blood sugar (glucose) after you have not eaten for a while (fasting). You may have this done every 1-3 years. Bone density scan. This is done to screen for osteoporosis. You may have this done starting at age 62. Mammogram. This may be done every 1-2 years. Talk to your health care provider about how often you should have regular mammograms. Talk with your health care provider about your test results, treatment options, and if necessary, the need for more tests. Vaccines  Your health care provider may recommend certain vaccines, such as: Influenza vaccine. This is recommended every year. Tetanus, diphtheria, and acellular pertussis (Tdap, Td) vaccine. You may need a Td booster every 10 years. Zoster vaccine. You may need this after age 58. Pneumococcal 13-valent conjugate (PCV13) vaccine.  One dose is recommended after age 10. Pneumococcal  polysaccharide (PPSV23) vaccine. One dose is recommended after age 59. Talk to your health care provider about which screenings and vaccines you need and how often you need them. This information is not intended to replace advice given to you by your health care provider. Make sure you discuss any questions you have with your health care provider. Document Released: 08/13/2015 Document Revised: 04/05/2016 Document Reviewed: 05/18/2015 Elsevier Interactive Patient Education  2017 [employer] Prevention in the Home Falls can cause injuries. They can happen to people of all ages. There are many things you can do to make your home safe and to help prevent falls. What can I do on the outside of my home? Regularly fix the edges of walkways and driveways and fix any cracks. Remove anything that might make you trip as you walk through a door, such as a raised step or threshold. Trim any bushes or trees on the path to your home. Use bright outdoor lighting. Clear any walking paths of anything that might make someone trip, such as rocks or tools. Regularly check to see if handrails are loose or broken. Make sure that both sides of any steps have handrails. Any raised decks and porches should have guardrails on the edges. Have any leaves, snow, or ice cleared regularly. Use sand or salt on walking paths during winter. Clean up any spills in your garage right away. This includes oil or grease spills. What can I do in the bathroom? Use night lights. Install grab bars by the toilet and in the tub and shower. Do not use towel bars as grab bars. Use non-skid mats or decals in the tub or shower. If you need to sit down in the shower, use a plastic, non-slip stool. Keep the floor dry. Clean up any water that spills on the floor as soon as it happens. Remove soap buildup in the tub or shower regularly. Attach bath mats securely with double-sided non-slip rug tape. Do not have throw rugs and other  things on the floor that can make you trip. What can I do in the bedroom? Use night lights. Make sure that you have a light by your bed that is easy to reach. Do not use any sheets or blankets that are too big for your bed. They should not hang down onto the floor. Have a firm chair that has side arms. You can use this for support while you get dressed. Do not have throw rugs and other things on the floor that can make you trip. What can I do in the kitchen? Clean up any spills right away. Avoid walking on wet floors. Keep items that you use a lot in easy-to-reach places. If you need to reach something above you, use a strong step stool that has a grab bar. Keep electrical cords out of the way. Do not use floor polish or wax that makes floors slippery. If you must use wax, use non-skid floor wax. Do not have throw rugs and other things on the floor that can make you trip. What can I do with my stairs? Do not leave any items on the stairs. Make sure that there are handrails on both sides of the stairs and use them. Fix handrails that are broken or loose. Make sure that handrails are as long as the stairways. Check any carpeting to make sure that it is firmly attached to the stairs. Fix any carpet that is loose or  worn. Avoid having throw rugs at the top or bottom of the stairs. If you do have throw rugs, attach them to the floor with carpet tape. Make sure that you have a light switch at the top of the stairs and the bottom of the stairs. If you do not have them, ask someone to add them for you. What else can I do to help prevent falls? Wear shoes that: Do not have high heels. Have rubber bottoms. Are comfortable and fit you well. Are closed at the toe. Do not wear sandals. If you use a stepladder: Make sure that it is fully opened. Do not climb a closed stepladder. Make sure that both sides of the stepladder are locked into place. Ask someone to hold it for you, if possible. Clearly  mark and make sure that you can see: Any grab bars or handrails. First and last steps. Where the edge of each step is. Use tools that help you move around (mobility aids) if they are needed. These include: Canes. Walkers. Scooters. Crutches. Turn on the lights when you go into a dark area. Replace any light bulbs as soon as they burn out. Set up your furniture so you have a clear path. Avoid moving your furniture around. If any of your floors are uneven, fix them. If there are any pets around you, be aware of where they are. Review your medicines with your doctor. Some medicines can make you feel dizzy. This can increase your chance of falling. Ask your doctor what other things that you can do to help prevent falls. This information is not intended to replace advice given to you by your health care provider. Make sure you discuss any questions you have with your health care provider. Document Released: 05/13/2009 Document Revised: 12/23/2015 Document Reviewed: 08/21/2014 Elsevier Interactive Patient Education  2017 [employer].

## 2021-03-25 ENCOUNTER — Telehealth: Payer: Self-pay

## 2021-03-25 LAB — CBC
HCT: 38.6 % (ref 36.0–46.0)
Hemoglobin: 12.6 g/dL (ref 12.0–15.0)
MCHC: 32.7 g/dL (ref 30.0–36.0)
MCV: 90.6 fl (ref 78.0–100.0)
Platelets: 295 10*3/uL (ref 150.0–400.0)
RBC: 4.26 Mil/uL (ref 3.87–5.11)
RDW: 14.4 % (ref 11.5–15.5)
WBC: 7.6 10*3/uL (ref 4.0–10.5)

## 2021-03-25 LAB — COMPREHENSIVE METABOLIC PANEL
ALT: 14 U/L (ref 0–35)
AST: 21 U/L (ref 0–37)
Albumin: 4 g/dL (ref 3.5–5.2)
Alkaline Phosphatase: 43 U/L (ref 39–117)
BUN: 33 mg/dL — ABNORMAL HIGH (ref 6–23)
CO2: 27 mEq/L (ref 19–32)
Calcium: 10.1 mg/dL (ref 8.4–10.5)
Chloride: 105 mEq/L (ref 96–112)
Creatinine, Ser: 1.89 mg/dL — ABNORMAL HIGH (ref 0.40–1.20)
GFR: 26.12 mL/min — ABNORMAL LOW (ref >60.00)
Glucose, Bld: 80 mg/dL (ref 70–99)
Potassium: 4.7 mEq/L (ref 3.5–5.1)
Sodium: 141 mEq/L (ref 135–145)
Total Bilirubin: 0.4 mg/dL (ref 0.2–1.2)
Total Protein: 6.5 g/dL (ref 6.0–8.3)

## 2021-03-25 LAB — LIPID PANEL
Cholesterol: 138 mg/dL (ref 0–200)
HDL: 50.6 mg/dL (ref >39.00)
LDL Cholesterol: 59 mg/dL (ref 0–99)
NonHDL: 87.47
Total CHOL/HDL Ratio: 3
Triglycerides: 143 mg/dL (ref 0.0–149.0)
VLDL: 28.6 mg/dL (ref 0.0–40.0)

## 2021-03-25 LAB — TSH: TSH: 0.14 u[IU]/mL — ABNORMAL LOW (ref 0.35–5.50)

## 2021-03-25 LAB — VITAMIN D 25 HYDROXY (VIT D DEFICIENCY, FRACTURES): VITD: 58.22 ng/mL (ref 30.00–100.00)

## 2021-03-25 LAB — HEMOGLOBIN A1C: Hgb A1c MFr Bld: 5.3 % (ref 4.6–6.5)

## 2021-03-25 LAB — T4, FREE: Free T4: 1.06 ng/dL (ref 0.60–1.60)

## 2021-03-27 DIAGNOSIS — L989 Disorder of the skin and subcutaneous tissue, unspecified: Secondary | ICD-10-CM

## 2021-03-27 HISTORY — DX: Disorder of the skin and subcutaneous tissue, unspecified: L98.9

## 2021-03-27 NOTE — Assessment & Plan Note (Signed)
Referred to dermatology for further evaluation.

## 2021-03-27 NOTE — Assessment & Plan Note (Signed)
Hydrate and monitor 

## 2021-03-27 NOTE — Assessment & Plan Note (Signed)
hgba1c acceptable, minimize simple carbs. Increase exercise as tolerated.  

## 2021-03-27 NOTE — Assessment & Plan Note (Signed)
Well controlled, no changes to meds. Encouraged heart healthy diet such as the DASH diet and exercise as tolerated.  °

## 2021-03-27 NOTE — Assessment & Plan Note (Signed)
Monitor labs 

## 2021-03-27 NOTE — Assessment & Plan Note (Signed)
Encourage heart healthy diet such as MIND or DASH diet, increase exercise, avoid trans fats, simple carbohydrates and processed foods, consider a krill or fish or flaxseed oil cap daily.  °

## 2021-03-27 NOTE — Assessment & Plan Note (Signed)
Avoid offending foods, start probiotics. Do not eat large meals in late evening and consider raising head of bed.  

## 2021-03-28 ENCOUNTER — Other Ambulatory Visit: Payer: Medicare Other

## 2021-03-28 ENCOUNTER — Telehealth: Payer: Self-pay

## 2021-03-28 NOTE — Telephone Encounter (Signed)
Left message for patient to call back. During her wellness visit last week patient stated that she was told she could not have her mammogram here because she had had it somewhere else previously. I verified with imaging that she could do the mammogram here.

## 2021-03-29 ENCOUNTER — Other Ambulatory Visit: Payer: Self-pay | Admitting: Family Medicine

## 2021-03-29 DIAGNOSIS — E785 Hyperlipidemia, unspecified: Secondary | ICD-10-CM

## 2021-04-01 ENCOUNTER — Other Ambulatory Visit: Payer: Self-pay | Admitting: Family Medicine

## 2021-04-01 NOTE — Telephone Encounter (Signed)
Requesting:klonopin 1 mg Contract:12/30/19 UDS:12/30/19 Last Visit:03/24/21 Next Visit:10/06/21 Last Refill:10/25/20  Please Advise

## 2021-04-12 ENCOUNTER — Telehealth: Payer: Self-pay | Admitting: Family Medicine

## 2021-04-12 NOTE — Telephone Encounter (Signed)
error 

## 2021-04-17 ENCOUNTER — Other Ambulatory Visit: Payer: Self-pay | Admitting: *Deleted

## 2021-04-17 MED ORDER — BREZTRI AEROSPHERE 160-9-4.8 MCG/ACT IN AERO: 2.0000 | INHALATION_SPRAY | Freq: Two times a day (BID) | RESPIRATORY_TRACT | 3 refills | Status: DC

## 2021-04-19 ENCOUNTER — Ambulatory Visit: Payer: Medicare Other

## 2021-04-20 ENCOUNTER — Encounter (HOSPITAL_BASED_OUTPATIENT_CLINIC_OR_DEPARTMENT_OTHER): Payer: Self-pay

## 2021-04-20 ENCOUNTER — Emergency Department (HOSPITAL_BASED_OUTPATIENT_CLINIC_OR_DEPARTMENT_OTHER): Payer: Medicare Other

## 2021-04-20 ENCOUNTER — Other Ambulatory Visit (HOSPITAL_BASED_OUTPATIENT_CLINIC_OR_DEPARTMENT_OTHER): Payer: Self-pay

## 2021-04-20 ENCOUNTER — Other Ambulatory Visit: Payer: Self-pay

## 2021-04-20 ENCOUNTER — Emergency Department (HOSPITAL_BASED_OUTPATIENT_CLINIC_OR_DEPARTMENT_OTHER)
Admission: EM | Admit: 2021-04-20 | Discharge: 2021-04-20 | Disposition: A | Discharge status: Home / Self Care | Payer: Medicare Other | Attending: Emergency Medicine | Admitting: Emergency Medicine

## 2021-04-20 DIAGNOSIS — Z87891 Personal history of nicotine dependence: Secondary | ICD-10-CM | POA: Diagnosis not present

## 2021-04-20 DIAGNOSIS — N6001 Solitary cyst of right breast: Secondary | ICD-10-CM | POA: Diagnosis present

## 2021-04-20 DIAGNOSIS — N611 Abscess of the breast and nipple: Secondary | ICD-10-CM | POA: Insufficient documentation

## 2021-04-20 DIAGNOSIS — Z96653 Presence of artificial knee joint, bilateral: Secondary | ICD-10-CM | POA: Diagnosis not present

## 2021-04-20 DIAGNOSIS — I509 Heart failure, unspecified: Secondary | ICD-10-CM | POA: Insufficient documentation

## 2021-04-20 DIAGNOSIS — Z7982 Long term (current) use of aspirin: Secondary | ICD-10-CM | POA: Diagnosis not present

## 2021-04-20 DIAGNOSIS — I2510 Atherosclerotic heart disease of native coronary artery without angina pectoris: Secondary | ICD-10-CM | POA: Diagnosis not present

## 2021-04-20 DIAGNOSIS — J45909 Unspecified asthma, uncomplicated: Secondary | ICD-10-CM | POA: Insufficient documentation

## 2021-04-20 DIAGNOSIS — Z7902 Long term (current) use of antithrombotics/antiplatelets: Secondary | ICD-10-CM | POA: Diagnosis not present

## 2021-04-20 DIAGNOSIS — Z79899 Other long term (current) drug therapy: Secondary | ICD-10-CM | POA: Diagnosis not present

## 2021-04-20 DIAGNOSIS — I130 Hypertensive heart and chronic kidney disease with heart failure and stage 1 through stage 4 chronic kidney disease, or unspecified chronic kidney disease: Secondary | ICD-10-CM | POA: Diagnosis not present

## 2021-04-20 DIAGNOSIS — E039 Hypothyroidism, unspecified: Secondary | ICD-10-CM | POA: Insufficient documentation

## 2021-04-20 DIAGNOSIS — N6489 Other specified disorders of breast: Secondary | ICD-10-CM | POA: Diagnosis not present

## 2021-04-20 DIAGNOSIS — J449 Chronic obstructive pulmonary disease, unspecified: Secondary | ICD-10-CM | POA: Diagnosis not present

## 2021-04-20 DIAGNOSIS — N184 Chronic kidney disease, stage 4 (severe): Secondary | ICD-10-CM | POA: Insufficient documentation

## 2021-04-20 MED ORDER — CLINDAMYCIN HCL 300 MG PO CAPS
300.0000 mg | ORAL_CAPSULE | Freq: Three times a day (TID) | ORAL | 0 refills | Status: AC
  Filled 2021-04-20: qty 30, 10d supply, fill #0

## 2021-04-20 NOTE — ED Provider Notes (Signed)
[facility] EMERGENCY DEPARTMENT Provider Note   CSN: [id number] Arrival date & time: 04/20/21  1229     History Chief Complaint  Patient presents with   Cyst    Emily Mitchell is a 75 y.o. female.  With past medical history of CAD, CKD, hypertension who presents emergency department with concern for breast cyst.  She states that she has had 1 week of increasing pain under the right breast.  She states that she had some increasing redness around the area of concern. She states that last night while she was sleeping, it "must have busted open," because she woke up with blood and drainage on her shirt. She was concerned because she had same area I/D'd before (1.5 years ago), and is anticoagulated. Denies fevers, nausea or vomiting, discharge from the nipple, inversion of the nipple, weight loss.   HPI     Past Medical History:  Diagnosis Date   Acid reflux    Anemia 07/05/2014   Anxiety and depression 12/14/2008   Qualifier: Diagnosis of  By: [name] LPN, Almyra Free     Arthritis    "fingers, toes, knees, joints" (07/18/2018)   Asthma    Atypical chest pain 05/23/2015   Back pain 02/02/2019   Breast mass, left 04/28/2019   CAD (coronary artery disease)    a. [place] with cath 06/2018 s/p DES to LAD.   CAD in native artery 07/19/2018   Candidal skin infection 02/28/2018   Carotid artery disease ([place]) 06/16/2015   S/p CEA    Carotid artery occlusion    a. R carotid occluded, 00-86% LICA.   Carotid bruit 02/23/2020   Chest pain 07/18/2018   CHF (congestive heart failure) (HCC)    Chicken pox as a child   Chronic kidney disease    Bright's Disease at age 83    Chronic obstructive pulmonary disease ([place]) 12/14/2008   Formatting of this note might be different from the original. Overview:  Spirometry 05/12/2013: FEV1 72% predicted FEV1 FVC ratio 73 predicted FEF 25 75  43% predicted  Last Assessment & Plan:  OK to stop taking advair Use albuterol as needed only Check oxygen levels  during sleep - we will call you with results Call if symptoms worse   Chronic rhinitis 06/14/2018   CKD (chronic kidney disease), stage IV ([place])    Follows with  Kidney, Dr Alonza Smoker    Congestive heart failure ([place]) 11/30/2015   Formatting of this note might be different from the original. Last Assessment & Plan:  No recent exacerbation   Constipation 12/17/2008   Qualifier: Diagnosis of  By: Westly Pam    COPD (chronic obstructive pulmonary disease) (HCC)    COPD with asthma ([place]) 11/06/2017   Essential hypertension 10/18/2015   Formatting of this note might be different from the original. Last Assessment & Plan:  poorlycontrolled and under a great deal of stress, add Metoprolol XL 25 mg daily. Encouraged heart healthy diet such as the DASH diet and exercise as tolerated.   Essential tremor 11/05/2013   Exudative age-related macular degeneration of right eye with active choroidal neovascularization ([place]) 08/26/2015   Gastroesophageal reflux disease 12/14/2008   Formatting of this note might be different from the original. Overview:  Qualifier: Diagnosis of  By: [name] LPN, Almyra Free   Last Assessment & Plan:  Avoid offending foods, start probiotics. Do not eat large meals in late evening and consider raising head of bed.   GERD (gastroesophageal reflux disease)  H. pylori infection 10/19/2013   H/O multiple allergies 16/05/9603   Helicobacter pylori (H. pylori) as the cause of diseases classified elsewhere 10/19/2013   Formatting of this note might be different from the original. Last Assessment & Plan:  Tetracycline, Pepto Bismol, Flagyl and Omeprazole.   Hepatitis 1970s   "don't know for sure which one it was; think it was A" (07/18/2018)   History of food allergy 08/14/2018   History of pulmonary embolism 07/17/2018   Hoarseness 03/09/2019   Hypercalcemia 03/09/2019   Hyperglycemia 03/14/2016   Hyperlipidemia    Hyperlipidemia, mixed 05/26/2013   Crestor caused N/V Did not  tolerate Lipitor    Hypertension    Hypothyroidism    Knee pain, bilateral 12/17/2012   Left hip pain 05/23/2015   Low back pain 09/19/2015   Lower urinary tract infectious disease 10/19/2013   Lymphadenopathy 10/18/2017   Macular degeneration of both eyes 12/16/2015   Right eye is wet Left Eye is dry Shots every 11 to 12 weeks in right eye at opthamologist office   Medicare annual wellness visit, subsequent 02/21/2015   Mixed anxiety depressive disorder 12/14/2008   Formatting of this note might be different from the original. Overview:  Qualifier: Diagnosis of  By: [name] LPN, Almyra Free  Patient had increased  Tremors on Bupropion  Last Assessment & Plan:  Is struggling with the stress of her 20 year old granddaughter whom she raised moving back with her own mother, the patient is estranged from her daughter so she is very upset. Increase Citalopram 40 mg daily    Mumps 75 yrs old   Neck pain 04/22/2017   Numbness of finger 12/17/2012   Occlusion and stenosis of carotid artery without mention of cerebral infarction 12/17/2012   OSA (obstructive sleep apnea) 12/14/2008   PSG  06/2013-AHI 6/h 08/2011 (bethany) AHI 4/h No longer needs CPAP    Pneumonia    "at least once" (07/18/2018)   Preventative health care 03/14/2016   Pulmonary emboli ([place]) 05/26/2013   Renal insufficiency 07/17/2018   Right shoulder pain 10/18/2017   Sensation of foreign body in larynx 04/08/2019   Sleep apnea    "don't use CPAP anymore; dr said I didn't have to" (07/18/2018)   Static tremor 11/05/2013   Formatting of this note might be different from the original. Last Assessment & Plan:  Follows with [facility]y   Statin intolerance 09/29/2019   Stroke [facility])    "been told I've had some strokes; didn't know I'd had them" (07/18/2018)   Tremor    UTI (lower urinary tract infection) 10/19/2013   Vitamin D deficiency 03/03/2018   Vitreomacular adhesion of both eyes 01/07/2019   Formatting of this note might be different from the  original. no traction    Patient Active Problem List   Diagnosis Date Noted   Skin lesion of back 03/27/2021   Asthma    Carotid artery occlusion    Chicken pox    Hepatitis    Hypertension    Mumps    Sleep apnea    Stroke [facility])    Tremor    GERD (gastroesophageal reflux disease)    CHF (congestive heart failure) ([place])    Statin intolerance 09/29/2019   Breast mass, left 04/28/2019   Sensation of foreign body in larynx 04/08/2019   Hypercalcemia 03/09/2019   Hoarseness 03/09/2019   Back pain 02/02/2019   Vitreomacular adhesion of both eyes 01/07/2019   History of food allergy 08/14/2018   CAD in  native artery 07/19/2018   Chest pain 07/18/2018   History of pulmonary embolism 07/17/2018   Renal insufficiency 07/17/2018   H/O multiple allergies 07/14/2018   Chronic rhinitis 06/14/2018   Vitamin D deficiency 03/03/2018   Candidal skin infection 02/28/2018   COPD with asthma ([place]) 11/06/2017   Lymphadenopathy 10/18/2017   Right shoulder pain 10/18/2017   Neck pain 04/22/2017   Hyperglycemia 03/14/2016   Preventative health care 03/14/2016   Macular degeneration of both eyes 12/16/2015   Arthritis 11/30/2015   Congestive heart failure ([place]) 11/30/2015   Chronic kidney disease 11/30/2015   Essential hypertension 10/18/2015   Low back pain 09/19/2015   Exudative age-related macular degeneration of right eye with active choroidal neovascularization ([place]) 08/26/2015   Carotid artery disease ([place]) 06/16/2015   Left hip pain 05/23/2015   Atypical chest pain 05/23/2015   Medicare annual wellness visit, subsequent 02/21/2015   Anemia 07/05/2014   Essential tremor 11/05/2013   Static tremor 11/05/2013   Lower urinary tract infectious disease 10/19/2013   H. pylori infection 85/09/7739   Helicobacter pylori (H. pylori) as the cause of diseases classified elsewhere 10/19/2013   Hyperlipidemia, mixed 05/26/2013   Pulmonary emboli (HCC) 05/26/2013   Acid reflux    CKD  (chronic kidney disease), stage IV (HCC)    Thyroid disease    Occlusion and stenosis of carotid artery without mention of cerebral infarction 12/17/2012   Knee pain, bilateral 12/17/2012   Numbness of finger 12/17/2012   Constipation 12/17/2008   Anxiety and depression 12/14/2008   OSA (obstructive sleep apnea) 12/14/2008   Chronic obstructive pulmonary disease ([place]) 12/14/2008   Mixed anxiety depressive disorder 12/14/2008    Past Surgical History:  Procedure Laterality Date   APPENDECTOMY  1984   CARDIAC CATHETERIZATION  ~ 2006   CAROTID ANGIOGRAPHY Left 08/20/2018   Procedure: CAROTID ANGIOGRAPHY;  Surgeon: Serafina Mitchell, MD;  Location: [facility];  Service: Cardiovascular;  Laterality: Left;   CAROTID ENDARTERECTOMY Right 05/10/07   cea   CATARACT EXTRACTION Left    CATARACT EXTRACTION W/ INTRAOCULAR LENS  IMPLANT, BILATERAL Bilateral    CORONARY ANGIOPLASTY WITH STENT PLACEMENT  07/18/2018   CORONARY STENT INTERVENTION N/A 07/18/2018   Procedure: CORONARY STENT INTERVENTION;  Surgeon: Lorretta Harp, MD;  Location: [facility];  Service: Cardiovascular;  Laterality: N/A;   INTRAVASCULAR PRESSURE WIRE/FFR STUDY N/A 07/18/2018   Procedure: INTRAVASCULAR PRESSURE WIRE/FFR STUDY;  Surgeon: Lorretta Harp, MD;  Location: [facility];  Service: Cardiovascular;  Laterality: N/A;   JOINT REPLACEMENT     LEFT HEART CATH AND CORONARY ANGIOGRAPHY N/A 07/18/2018   Procedure: LEFT HEART CATH AND CORONARY ANGIOGRAPHY;  Surgeon: Lorretta Harp, MD;  Location: [facility];  Service: Cardiovascular;  Laterality: N/A;   MULTIPLE TOOTH EXTRACTIONS     TOTAL KNEE ARTHROPLASTY Bilateral 2005 - 2011   right - left   VAGINAL HYSTERECTOMY  1984   WISDOM TOOTH EXTRACTION       OB History   No obstetric history on file.     Family History  Problem Relation Age of Onset   Stroke Mother        mini stroke   Kidney disease Mother    Heart failure Mother     Hypertension Mother    Diabetes Sister        type 2   Kidney disease Sister    Allergic rhinitis Sister    Heart attack Maternal Grandfather  Hyperlipidemia Sister    Colon cancer Neg Hx     Social History   Tobacco Use   Smoking status: Former    Packs/day: 2.00    Years: 30.00    Pack years: 60.00    Types: Cigarettes    Quit date: 12/03/1994    Years since quitting: 26.3   Smokeless tobacco: Never  Vaping Use   Vaping Use: Never used  Substance Use Topics   Alcohol use: Yes    Comment: 07/18/2018 "1 drink q 1-2 months; if that"   Drug use: Never    Home Medications Prior to Admission medications   Medication Sig Start Date End Date Taking? Authorizing Provider  acetaminophen (TYLENOL) 500 MG tablet Take 500 mg by mouth as needed.    [provider]  albuterol (VENTOLIN HFA) 108 (90 Base) MCG/ACT inhaler Inhale 2 puffs into the lungs every 6 (six) hours as needed for wheezing or shortness of breath. 01/28/20   Rigoberto Noel, MD  amLODipine (NORVASC) 2.5 MG tablet Take 1 tablet (2.5 mg total) by mouth daily. Patient not taking: Reported on 03/24/2021 01/19/21   Mosie Lukes, MD  aspirin EC 81 MG tablet Take 1 tablet (81 mg total) by mouth daily. 07/17/18   Revankar, Reita Cliche, MD  atorvastatin (LIPITOR) 10 MG tablet TAKE ONE TABLET BY MOUTH DAILY 01/19/21   Revankar, Reita Cliche, MD  Budeson-Glycopyrrol-Formoterol (BREZTRI AEROSPHERE) 160-9-4.8 MCG/ACT AERO Inhale 2 puffs into the lungs 2 (two) times daily. INHALE TWO PUFFS BY MOUTH TWICE A DAY 04/17/21   Rigoberto Noel, MD  Calcium Citrate-Vitamin D (CALCIUM + D PO) Take 1 tablet by mouth daily.    [provider]  Calcium Citrate-Vitamin D 200-250 MG-UNIT TABS Take 1 tablet by mouth daily.    [provider]  carvedilol (COREG) 6.25 MG tablet Take 1 tablet (6.25 mg total) by mouth 2 (two) times daily with a meal. Patient not taking: Reported on 03/24/2021 01/19/21   Mosie Lukes, MD   Cholecalciferol 25 MCG (1000 UT) tablet Take 1,000 Units by mouth daily. 12/05/18   [provider]  Choline Fenofibrate (FENOFIBRIC ACID) 135 MG CPDR Take 1 capsule by mouth daily. 01/19/21   Mosie Lukes, MD  clonazePAM (KLONOPIN) 1 MG tablet TAKE ONE TABLET BY MOUTH THREE TIMES A DAY AS NEEDED FOR ANXIETY 04/01/21   Mosie Lukes, MD  clopidogrel (PLAVIX) 75 MG tablet TAKE ONE TABLET BY MOUTH DAILY 03/29/21   Mosie Lukes, MD  cyanocobalamin 100 MCG tablet Take 500 mcg by mouth daily. 12/05/18   [provider]  ezetimibe (ZETIA) 10 MG tablet TAKE ONE TABLET BY MOUTH DAILY 03/29/21   Mosie Lukes, MD  famotidine (PEPCID) 40 MG tablet TAKE ONE TABLET BY MOUTH DAILY 03/29/21   Mosie Lukes, MD  FLUoxetine (PROZAC) 20 MG tablet TAKE ONE TABLET BY MOUTH DAILY 03/29/21   Mosie Lukes, MD  gabapentin (NEURONTIN) 100 MG capsule Take 2 capsules (200 mg total) by mouth at bedtime. 01/19/21   Mosie Lukes, MD  levothyroxine (SYNTHROID) 112 MCG tablet TAKE ONE TABLET BY MOUTH DAILY 03/29/21   Mosie Lukes, MD  montelukast (SINGULAIR) 10 MG tablet TAKE ONE TABLET BY MOUTH EVERY NIGHT AT BEDTIME AS NEEDED 03/29/21   Mosie Lukes, MD  Multiple Vitamins-Minerals (PRESERVISION AREDS 2 PO) Take 1 capsule by mouth daily.    [provider]  NON FORMULARY Take 1 tablet by mouth daily. Digestive  advantage.    [provider]  Omega-3 Fatty Acids (FISH OIL PO) Take 1 capsule by mouth daily.    [provider]  Probiotic Product (VSL#3) CAPS Take 1 capsule by mouth daily.    [provider]  Spacer/Aero-Holding Dorise Bullion Use with inhaler. 03/10/21   Parrett, Fonnie Mu, NP  traMADol (ULTRAM) 50 MG tablet Take 1 tablet (50 mg total) by mouth every 8 (eight) hours as needed for moderate pain or severe pain. 01/10/21   Mosie Lukes, MD    Allergies    Niaspan [niacin er], Pantoprazole, Sulfonamide derivatives, Bupropion, Nitroglycerin, Penicillins,  Statins, Apple, and Banana  Review of Systems   Review of Systems  Constitutional:  Negative for fever.  Skin:  Positive for wound.  All other systems reviewed and are negative.  Physical Exam Updated Vital Signs BP 140/74 (BP Location: Left Arm)   Pulse 77   Temp 98.3 F (36.8 C) (Oral)   Resp 18   Ht 5\' 3"  (1.6 m)   Wt 81.6 kg   SpO2 95%   BMI 31.89 kg/m   Physical Exam Vitals and nursing note reviewed.  Constitutional:      General: She is not in acute distress.    Appearance: Normal appearance. She is not toxic-appearing.  HENT:     Head: Normocephalic and atraumatic.  Eyes:     General: No scleral icterus. Cardiovascular:     Rate and Rhythm: Normal rate and regular rhythm.     Pulses: Normal pulses.     Heart sounds: No murmur heard. Pulmonary:     Effort: Pulmonary effort is normal. No respiratory distress.     Breath sounds: Normal breath sounds.  Skin:    General: Skin is warm and dry.     Capillary Refill: Capillary refill takes less than 2 seconds.     Findings: Erythema and wound present. No rash.          Comments: 1cm opening under the right breast with bloody discharge. There is 3-4cm of redness surrounding the wound. On initial evaluation, I am unable to express purulent drainage from the area. There is blood clots present which I have removed to explore deeper into the wound. I am still unable to express purulence.   Neurological:     General: No focal deficit present.     Mental Status: She is alert and oriented to person, place, and time. Mental status is at baseline.  Psychiatric:        Mood and Affect: Mood normal.        Behavior: Behavior normal.        Thought Content: Thought content normal.        Judgment: Judgment normal.    ED Results / Procedures / Treatments   Labs (all labs ordered are listed, but only abnormal results are displayed) Labs Reviewed - No data to display  EKG None  Radiology [place] CHEST SOFT TISSUE  Result  Date: 04/20/2021 CLINICAL DATA:  Right breast superficial knot. Bleeding. Evaluate for abscess. EXAM: ULTRASOUND OF right breast SOFT TISSUES TECHNIQUE: Ultrasound examination was performed in the area of clinical concern. COMPARISON:  None. FINDINGS: Dedicated scanning of the inferior right breast was performed. Just beneath the skin surface there is a 1.1 x 1.3 x 3.0 cm heterogeneous hypoechoic area. This is fairly well-circumscribed. Minimal vascularity seen within this region. IMPRESSION: 1. 3.0 x 1.1 x 1.3 cm heterogeneous hypoechoic area just beneath the skin surface in the  inferior right breast. Findings are indeterminate and may represent focal edematous tissue or phlegmon. Other masses are not excluded. No discrete drainable fluid collection identified. Electronically Signed   By: Ronney Asters M.D.   On: 04/20/2021 15:41    Procedures Procedures   Medications Ordered in ED Medications - No data to display  ED Course  I have reviewed the triage vital signs and the nursing notes.  Pertinent labs & imaging results that were available during my care of the patient were reviewed by me and considered in my medical decision making (see chart for details).    MDM Rules/Calculators/A&P 20yoF with extensive PMH including previous drainage of abscess from same location presents with concern for recurrence of breast abscess.  She does not appear toxic or septic on evaluation. No fevers. So did not obtain lab work.   Opted to evaluate with imaging before potential I/D as HPI consistent with wound already drained overnight.  [place] R Breast shows: roughly 3x1x1cm heterogenous hypoechoic area consistent with edematous tissue vs. Phlegmon. No discrete drainable fluid collection.  Given reading on [place], will not explore wound further. Unable to express any purulence through opening in the wound. Wound is hemostatic despite anticoagulation. She is HDS and no signs of acute blood loss anemia.   Will provide  her with 10 days of clindamycin. She understands to return if reaccumulation, or increasing purulent discharge from wound, fevers, increasing redness.   Safe for discharge.  Final Clinical Impression(s) / ED Diagnoses Final diagnoses:  Breast abscess of female    Rx / DC Orders ED Discharge Orders          Ordered    clindamycin (CLEOCIN) 300 MG capsule  3 times daily        04/20/21 1606             Mickie Hillier, PA-C 04/20/21 2316    Jeanell Sparrow, DO 04/21/21 1843

## 2021-04-20 NOTE — Discharge Instructions (Addendum)
You were seen in the emergency department today for drainage from a wound on your breast.  This likely was an abscess that popped overnight as you described.  When I pressed on your breast there was no pus that came from it.  We obtained imaging of your breast which showed there was no fluid collection for [place] to go in and drained.  I am placing you on an antibiotic called clindamycin.  You can take this for the next 10 days.  It may cause upset stomach.  You may take it with food.  We discussed reasons you should return to the emergency department including recurrence of the collection, bleeding that does not stop, pus coming from the opening, increased spreading of the redness around the opening, fever.  You may also return to the emergency department for any reason.  I have attached the [facility] for you to follow-up with them if you are able to.  Otherwise ensure that you follow-up with your mammogram next month.

## 2021-04-20 NOTE — ED Triage Notes (Signed)
Pt c/o right breast cyst x 1 week-NAD-steady gait

## 2021-04-21 ENCOUNTER — Other Ambulatory Visit (HOSPITAL_BASED_OUTPATIENT_CLINIC_OR_DEPARTMENT_OTHER): Payer: Medicare Other

## 2021-04-25 ENCOUNTER — Ambulatory Visit: Payer: Medicare Other | Admitting: Family Medicine

## 2021-04-27 DIAGNOSIS — H353212 Exudative age-related macular degeneration, right eye, with inactive choroidal neovascularization: Secondary | ICD-10-CM | POA: Diagnosis not present

## 2021-04-27 DIAGNOSIS — H35342 Macular cyst, hole, or pseudohole, left eye: Secondary | ICD-10-CM | POA: Diagnosis not present

## 2021-04-27 DIAGNOSIS — H3554 Dystrophies primarily involving the retinal pigment epithelium: Secondary | ICD-10-CM | POA: Diagnosis not present

## 2021-04-27 DIAGNOSIS — H43823 Vitreomacular adhesion, bilateral: Secondary | ICD-10-CM | POA: Diagnosis not present

## 2021-04-27 DIAGNOSIS — Z961 Presence of intraocular lens: Secondary | ICD-10-CM | POA: Diagnosis not present

## 2021-04-27 DIAGNOSIS — H353221 Exudative age-related macular degeneration, left eye, with active choroidal neovascularization: Secondary | ICD-10-CM | POA: Diagnosis not present

## 2021-04-27 DIAGNOSIS — H35361 Drusen (degenerative) of macula, right eye: Secondary | ICD-10-CM | POA: Diagnosis not present

## 2021-05-04 ENCOUNTER — Other Ambulatory Visit: Payer: Self-pay | Admitting: Adult Health

## 2021-05-12 ENCOUNTER — Ambulatory Visit (HOSPITAL_BASED_OUTPATIENT_CLINIC_OR_DEPARTMENT_OTHER)
Admission: RE | Admit: 2021-05-12 | Discharge: 2021-05-12 | Disposition: A | Discharge status: Home / Self Care | Payer: Medicare Other | Source: Ambulatory Visit | Attending: Family Medicine | Admitting: Family Medicine

## 2021-05-12 ENCOUNTER — Other Ambulatory Visit: Payer: Self-pay

## 2021-05-12 DIAGNOSIS — Z780 Asymptomatic menopausal state: Secondary | ICD-10-CM | POA: Diagnosis not present

## 2021-05-12 DIAGNOSIS — M8589 Other specified disorders of bone density and structure, multiple sites: Secondary | ICD-10-CM | POA: Diagnosis not present

## 2021-05-17 DIAGNOSIS — N189 Chronic kidney disease, unspecified: Secondary | ICD-10-CM | POA: Diagnosis not present

## 2021-05-17 DIAGNOSIS — N1832 Chronic kidney disease, stage 3b: Secondary | ICD-10-CM | POA: Diagnosis not present

## 2021-05-24 ENCOUNTER — Telehealth: Payer: Self-pay | Admitting: Family Medicine

## 2021-05-24 NOTE — Telephone Encounter (Signed)
Emily Mitchell [facility] Pt came in for screening mammogram and she has a right breast cyst.   Bilateral diagnosed mammogram and right breast ultrasound orders are needed. Fax [phone]. If any new questions develop please contact [phone].

## 2021-05-25 ENCOUNTER — Other Ambulatory Visit: Payer: Self-pay

## 2021-05-25 DIAGNOSIS — N6001 Solitary cyst of right breast: Secondary | ICD-10-CM

## 2021-05-25 NOTE — Telephone Encounter (Signed)
Orders placed.

## 2021-05-25 NOTE — Telephone Encounter (Signed)
faxed

## 2021-05-26 DIAGNOSIS — Z1231 Encounter for screening mammogram for malignant neoplasm of breast: Secondary | ICD-10-CM | POA: Diagnosis not present

## 2021-05-28 LAB — HM MAMMOGRAPHY

## 2021-05-30 ENCOUNTER — Encounter: Payer: Self-pay | Admitting: *Deleted

## 2021-06-10 ENCOUNTER — Other Ambulatory Visit (HOSPITAL_COMMUNITY): Payer: Self-pay

## 2021-06-10 ENCOUNTER — Other Ambulatory Visit: Payer: Self-pay

## 2021-06-10 ENCOUNTER — Telehealth: Payer: Self-pay | Admitting: Pharmacy Technician

## 2021-06-10 NOTE — Telephone Encounter (Signed)
Patient Advocate Encounter  Received notification from [place] that prior authorization for BREZTRI is required.   PA NOT NEEDED. RAN TEST CLAIM. $9.85 FOR 30-DAY/1 INHALER    [place]ane, CPhT Patient Advocate Phone: [phone] Fax:  [phone]

## 2021-06-13 ENCOUNTER — Ambulatory Visit (INDEPENDENT_AMBULATORY_CARE_PROVIDER_SITE_OTHER): Payer: Medicare Other | Admitting: Cardiology

## 2021-06-13 ENCOUNTER — Other Ambulatory Visit: Payer: Self-pay

## 2021-06-13 ENCOUNTER — Encounter: Payer: Self-pay | Admitting: Cardiology

## 2021-06-13 VITALS — BP 100/54 | HR 82 | Ht 62.6 in | Wt 181.0 lb

## 2021-06-13 DIAGNOSIS — E782 Mixed hyperlipidemia: Secondary | ICD-10-CM

## 2021-06-13 DIAGNOSIS — N289 Disorder of kidney and ureter, unspecified: Secondary | ICD-10-CM | POA: Diagnosis not present

## 2021-06-13 DIAGNOSIS — I2510 Atherosclerotic heart disease of native coronary artery without angina pectoris: Secondary | ICD-10-CM | POA: Diagnosis not present

## 2021-06-13 DIAGNOSIS — I10 Essential (primary) hypertension: Secondary | ICD-10-CM

## 2021-06-13 DIAGNOSIS — N189 Chronic kidney disease, unspecified: Secondary | ICD-10-CM | POA: Diagnosis not present

## 2021-06-13 NOTE — Progress Notes (Signed)
Cardiology Office Note:    Date:  06/13/2021   ID:  Emily Mitchell, DOB 04/20/1946, MRN [id number]  PCP:  Mosie Lukes, MD  Cardiologist:  Jenean Lindau, MD   Referring MD: Mosie Lukes, MD    ASSESSMENT:    1. CAD in native artery   2. Chronic kidney disease, unspecified CKD stage   3. Renal insufficiency   4. Hyperlipidemia, mixed   5. Essential hypertension    PLAN:    In order of problems listed above:  Coronary artery disease: Secondary prevention stressed to the patient.  Importance of compliance with diet medication stressed and she vocalized understanding she was advised to continue her exercise 5 days at least on a weekly basis.  She promises to do so. Essential hypertension: Blood pressure stable and diet was emphasized. Mixed dyslipidemia: Lipids were reviewed and I am happy with the numbers.  They were done in August.  KPN sheet was reviewed. Renal insufficiency: Stable at this time and followed by primary care. Obesity: Weight reduction was stressed and she promises to do better.  I told her to intensify her exercise protocol and she is agreeable. Patient will be seen in follow-up appointment in 6 months or earlier if the patient has any concerns    Medication Adjustments/Labs and Tests Ordered: Current medicines are reviewed at length with the patient today.  Concerns regarding medicines are outlined above.  No orders of the defined types were placed in this encounter.  No orders of the defined types were placed in this encounter.    No chief complaint on file.    History of Present Illness:    Emily Mitchell is a 75 y.o. female.  Patient has past medical history of coronary artery disease, essential hypertension, carotid endarterectomy, mixed dyslipidemia and renal insufficiency.  She denies any problems at this time and takes care of activities of daily living.  No chest pain orthopnea or PND.  She rides the bicycle on a regular basis.  At the  time of my evaluation, the patient is alert awake oriented and in no distress.  Past Medical History:  Diagnosis Date   Acid reflux    Anemia 07/05/2014   Anxiety and depression 12/14/2008   Qualifier: Diagnosis of  By: [name] LPN, Almyra Free     Arthritis    "fingers, toes, knees, joints" (07/18/2018)   Asthma    Atherosclerotic heart disease of native coronary artery without angina pectoris 07/19/2018   Atypical chest pain 05/23/2015   Back pain 02/02/2019   Breast mass, left 04/28/2019   CAD in native artery 07/19/2018   Candidal skin infection 02/28/2018   Carotid artery disease ([place]) 06/16/2015   S/p CEA    Carotid artery occlusion    a. R carotid occluded, 14-48% LICA.   Chest pain 07/18/2018   CHF (congestive heart failure) (HCC)    Chicken pox as a child   Chronic kidney disease    Bright's Disease at age 10    Chronic obstructive pulmonary disease ([place]) 12/14/2008   Formatting of this note might be different from the original. Overview:  Spirometry 05/12/2013: FEV1 72% predicted FEV1 FVC ratio 73 predicted FEF 25 75  43% predicted  Last Assessment & Plan:  OK to stop taking advair Use albuterol as needed only Check oxygen levels during sleep - we will call you with results Call if symptoms worse   Chronic rhinitis 06/14/2018   CKD (chronic kidney disease), stage IV ([place])  Follows with [facility], Dr Alonza Smoker    Congestive heart failure [facility]) 11/30/2015   Formatting of this note might be different from the original. Last Assessment & Plan:  No recent exacerbation   Constipation 12/17/2008   Qualifier: Diagnosis of  By: Westly Pam.    COPD with asthma ([place]) 11/06/2017   Essential hypertension 10/18/2015   Formatting of this note might be different from the original. Last Assessment & Plan:  poorlycontrolled and under a great deal of stress, add Metoprolol XL 25 mg daily. Encouraged heart healthy diet such as the DASH diet and exercise as tolerated.    Essential tremor 11/05/2013   Exudative age-related macular degeneration of right eye with active choroidal neovascularization ([place]) 08/26/2015   Gastro-esophageal reflux disease without esophagitis 12/14/2008   Formatting of this note might be different from the original. Formatting of this note might be different from the original. Formatting of this note might be different from the original. Overview:  Qualifier: Diagnosis of  By: [name] LPN, Almyra Free   Last Assessment & Plan:  Avoid offending foods, start probiotics. Do not eat large meals in late evening and consider raising head of bed. Last Assessment   GERD (gastroesophageal reflux disease)    H. pylori infection 10/19/2013   H/O multiple allergies 95/63/8756   Helicobacter pylori (H. pylori) as the cause of diseases classified elsewhere 10/19/2013   Formatting of this note might be different from the original. Last Assessment & Plan:  Tetracycline, Pepto Bismol, Flagyl and Omeprazole.   Hepatitis 1970s   "don't know for sure which one it was; think it was A" (07/18/2018)   History of food allergy 08/14/2018   History of pulmonary embolism 07/17/2018   Hoarseness 03/09/2019   Hypercalcemia 03/09/2019   Hyperglycemia 03/14/2016   Hyperlipidemia, mixed 05/26/2013   Crestor caused N/V Did not tolerate Lipitor    Hypertension    Hypothyroidism    Knee pain, bilateral 12/17/2012   Left hip pain 05/23/2015   Low back pain 09/19/2015   Lower urinary tract infectious disease 10/19/2013   Lymphadenopathy 10/18/2017   Macular degeneration of both eyes 12/16/2015   Right eye is wet Left Eye is dry Shots every 11 to 12 weeks in right eye at opthamologist office   Medicare annual wellness visit, subsequent 02/21/2015   Mixed anxiety depressive disorder 12/14/2008   Formatting of this note might be different from the original. Overview:  Qualifier: Diagnosis of  By: [name] LPN, Almyra Free  Patient had increased  Tremors on Bupropion  Last Assessment &  Plan:  Is struggling with the stress of her 72 year old granddaughter whom she raised moving back with her own mother, the patient is estranged from her daughter so she is very upset. Increase Citalopram 40 mg daily    Mumps 75 yrs old   Neck pain 04/22/2017   Numbness of finger 12/17/2012   Occlusion and stenosis of carotid artery without mention of cerebral infarction 12/17/2012   OSA (obstructive sleep apnea) 12/14/2008   PSG  06/2013-AHI 6/h 08/2011 ([facility]) AHI 4/h No longer needs CPAP    Preventative health care 03/14/2016   Pulmonary emboli ([place]) 05/26/2013   Renal insufficiency 07/17/2018   Right shoulder pain 10/18/2017   Sensation of foreign body in larynx 04/08/2019   Skin lesion of back 03/27/2021   Sleep apnea    "don't use CPAP anymore; dr said I didn't have to" (07/18/2018)   Static tremor 11/05/2013   Formatting of this  note might be different from the original. Last Assessment & Plan:  Follows with [facility]   Statin intolerance 09/29/2019   Stroke [facility])    "been told I've had some strokes; didn't know I'd had them" (07/18/2018)   Thyroid disease    Tremor    Vitamin D deficiency 03/03/2018   Vitreomacular adhesion of both eyes 01/07/2019   Formatting of this note might be different from the original. no traction    Past Surgical History:  Procedure Laterality Date   APPENDECTOMY  1984   CARDIAC CATHETERIZATION  ~ 2006   CAROTID ANGIOGRAPHY Left 08/20/2018   Procedure: CAROTID ANGIOGRAPHY;  Surgeon: Serafina Mitchell, MD;  Location: [place] [facility];  Service: Cardiovascular;  Laterality: Left;   CAROTID ENDARTERECTOMY Right 05/10/07   cea   CATARACT EXTRACTION Left    CATARACT EXTRACTION W/ INTRAOCULAR LENS  IMPLANT, BILATERAL Bilateral    CORONARY ANGIOPLASTY WITH STENT PLACEMENT  07/18/2018   CORONARY STENT INTERVENTION N/A 07/18/2018   Procedure: CORONARY STENT INTERVENTION;  Surgeon: Lorretta Harp, MD;  Location: [place] [facility];  Service:  Cardiovascular;  Laterality: N/A;   INTRAVASCULAR PRESSURE WIRE/FFR STUDY N/A 07/18/2018   Procedure: INTRAVASCULAR PRESSURE WIRE/FFR STUDY;  Surgeon: Lorretta Harp, MD;  Location: [place] [facility];  Service: Cardiovascular;  Laterality: N/A;   JOINT REPLACEMENT     LEFT HEART CATH AND CORONARY ANGIOGRAPHY N/A 07/18/2018   Procedure: LEFT HEART CATH AND CORONARY ANGIOGRAPHY;  Surgeon: Lorretta Harp, MD;  Location: [place] [facility];  Service: Cardiovascular;  Laterality: N/A;   MULTIPLE TOOTH EXTRACTIONS     TOTAL KNEE ARTHROPLASTY Bilateral 2005 - 2011   right - left   VAGINAL HYSTERECTOMY  1984   WISDOM TOOTH EXTRACTION      Current Medications: Current Meds  Medication Sig   acetaminophen (TYLENOL) 500 MG tablet Take 500 mg by mouth as needed for headache.   albuterol (VENTOLIN HFA) 108 (90 Base) MCG/ACT inhaler Inhale 2 puffs into the lungs every 6 (six) hours as needed for wheezing or shortness of breath.   aspirin EC 81 MG tablet Take 1 tablet (81 mg total) by mouth daily.   atorvastatin (LIPITOR) 10 MG tablet TAKE ONE TABLET BY MOUTH DAILY   Budeson-Glycopyrrol-Formoterol (BREZTRI AEROSPHERE) 160-9-4.8 MCG/ACT AERO INHALE TWO PUFFS BY MOUTH TWICE A DAY   Calcium Citrate-Vitamin D (CALCIUM + D PO) Take 1 tablet by mouth daily.   Calcium Citrate-Vitamin D 200-250 MG-UNIT TABS Take 1 tablet by mouth daily.   Cholecalciferol 25 MCG (1000 UT) tablet Take 1,000 Units by mouth daily.   Choline Fenofibrate (FENOFIBRIC ACID) 135 MG CPDR Take 1 capsule by mouth daily.   clonazePAM (KLONOPIN) 1 MG tablet TAKE ONE TABLET BY MOUTH THREE TIMES A DAY AS NEEDED FOR ANXIETY   clopidogrel (PLAVIX) 75 MG tablet TAKE ONE TABLET BY MOUTH DAILY   cyanocobalamin 100 MCG tablet Take 500 mcg by mouth daily.   ezetimibe (ZETIA) 10 MG tablet TAKE ONE TABLET BY MOUTH DAILY   famotidine (PEPCID) 40 MG tablet TAKE ONE TABLET BY MOUTH DAILY   FLUoxetine (PROZAC) 20 MG tablet TAKE ONE TABLET BY MOUTH  DAILY   gabapentin (NEURONTIN) 100 MG capsule Take 2 capsules (200 mg total) by mouth at bedtime.   levothyroxine (SYNTHROID) 112 MCG tablet TAKE ONE TABLET BY MOUTH DAILY   montelukast (SINGULAIR) 10 MG tablet Take 10 mg by mouth at bedtime as needed for allergies.   Multiple Vitamins-Minerals (  PRESERVISION AREDS 2 PO) Take 1 capsule by mouth daily.   NON FORMULARY Take 1 tablet by mouth daily. Digestive advantage.   Omega-3 Fatty Acids (FISH OIL PO) Take 1 capsule by mouth daily.   Probiotic Product (VSL#3) CAPS Take 1 capsule by mouth daily.   traMADol (ULTRAM) 50 MG tablet Take 1 tablet (50 mg total) by mouth every 8 (eight) hours as needed for moderate pain or severe pain.     Allergies:   Niaspan [niacin er], Pantoprazole, Sulfonamide derivatives, Bupropion, Nitroglycerin, Penicillins, Statins, Apple, and Banana   Social History   Socioeconomic History   Marital status: Divorced    Spouse name: Not on file   Number of children: 2   Years of education: Not on file   Highest education level: Not on file  Occupational History   Not on file  Tobacco Use   Smoking status: Former    Packs/day: 2.00    Years: 30.00    Pack years: 60.00    Types: Cigarettes    Quit date: 12/03/1994    Years since quitting: 26.5   Smokeless tobacco: Never  Vaping Use   Vaping Use: Never used  Substance and Sexual Activity   Alcohol use: Yes    Comment: 07/18/2018 "1 drink q 1-2 months; if that"   Drug use: Never   Sexual activity: Not Currently    Comment: lives with son and friend, no dietary restrictions  Other Topics Concern   Not on file  Social History Narrative   Right handed   No caffeine   One story home   Social Determinants of Health   Financial Resource Strain: Low Risk    Difficulty of Paying Living Expenses: Not hard at all  Food Insecurity: No Food Insecurity   Worried About Charity fundraiser in the Last Year: Never true   [place] in the Last Year: Never true   Transportation Needs: No Transportation Needs   Lack of Transportation (Medical): No   Lack of Transportation (Non-Medical): No  Physical Activity: Insufficiently Active   Days of Exercise per Week: 7 days   Minutes of Exercise per Session: 20 min  Stress: No Stress Concern Present   Feeling of Stress : Not at all  Social Connections: Socially Isolated   Frequency of Communication with Friends and Family: More than three times a week   Frequency of Social Gatherings with Friends and Family: More than three times a week   Attends Religious Services: Never   Marine scientist or Organizations: No   Attends Archivist Meetings: Never   Marital Status: Divorced     Family History: The patient's family history includes Allergic rhinitis in her sister; Diabetes in her sister; Heart attack in her maternal grandfather; Heart failure in her mother; Hyperlipidemia in her sister; Hypertension in her mother; Kidney disease in her mother and sister; Stroke in her mother. There is no history of Colon cancer.  ROS:   Please see the history of present illness.    All other systems reviewed and are negative.  EKGs/Labs/Other Studies Reviewed:    The following studies were reviewed today: I discussed my findings with the patient at length.   Recent Labs: 03/24/2021: ALT 14; BUN 33; Creatinine, Ser 1.89; Hemoglobin 12.6; Platelets 295.0; Potassium 4.7; Sodium 141; TSH 0.14  Recent Lipid Panel    Component Value Date/Time   CHOL 138 03/24/2021 1456   CHOL 143 04/12/2020 1137   TRIG  143.0 03/24/2021 1456   HDL 50.60 03/24/2021 1456   HDL 44 04/12/2020 1137   CHOLHDL 3 03/24/2021 1456   VLDL 28.6 03/24/2021 1456   LDLCALC 59 03/24/2021 1456   LDLCALC 71 04/12/2020 1137   LDLDIRECT 103.0 07/14/2019 1323    Physical Exam:    VS:  BP (!) 100/54   Pulse 82   Ht 5' 2.6" (1.59 m)   Wt 181 lb (82.1 kg)   SpO2 96%   BMI 32.47 kg/m     Wt Readings from Last 3 Encounters:   06/13/21 181 lb (82.1 kg)  04/20/21 180 lb (81.6 kg)  03/24/21 179 lb 3.2 oz (81.3 kg)     GEN: Patient is in no acute distress HEENT: Normal NECK: No JVD; No carotid bruits LYMPHATICS: No lymphadenopathy CARDIAC: Hear sounds regular, 2/6 systolic murmur at the apex. RESPIRATORY:  Clear to auscultation without rales, wheezing or rhonchi  ABDOMEN: Soft, non-tender, non-distended MUSCULOSKELETAL:  No edema; No deformity  SKIN: Warm and dry NEUROLOGIC:  Alert and oriented x 3 PSYCHIATRIC:  Normal affect   Signed, Jenean Lindau, MD  06/13/2021 10:58 AM    [place]

## 2021-06-13 NOTE — Patient Instructions (Signed)

## 2021-06-15 DIAGNOSIS — H35342 Macular cyst, hole, or pseudohole, left eye: Secondary | ICD-10-CM | POA: Diagnosis not present

## 2021-06-15 DIAGNOSIS — H43823 Vitreomacular adhesion, bilateral: Secondary | ICD-10-CM | POA: Diagnosis not present

## 2021-06-15 DIAGNOSIS — H35363 Drusen (degenerative) of macula, bilateral: Secondary | ICD-10-CM | POA: Diagnosis not present

## 2021-06-15 DIAGNOSIS — Z961 Presence of intraocular lens: Secondary | ICD-10-CM | POA: Diagnosis not present

## 2021-06-15 DIAGNOSIS — H353221 Exudative age-related macular degeneration, left eye, with active choroidal neovascularization: Secondary | ICD-10-CM | POA: Diagnosis not present

## 2021-06-15 DIAGNOSIS — H353212 Exudative age-related macular degeneration, right eye, with inactive choroidal neovascularization: Secondary | ICD-10-CM | POA: Diagnosis not present

## 2021-06-15 DIAGNOSIS — H3554 Dystrophies primarily involving the retinal pigment epithelium: Secondary | ICD-10-CM | POA: Diagnosis not present

## 2021-06-16 ENCOUNTER — Telehealth: Payer: Self-pay | Admitting: *Deleted

## 2021-06-16 NOTE — Telephone Encounter (Signed)
Pt has lab appointment on 06/28/21.  There is a future order dated 02/25/21 for comprehensive metabolic panel. There are orders from 02/25/21 for u/a, urine culture, stool culture, c. Diff, O&P, Stool WBC/lactofferin.  Scheduling notes say 3 month labs.  Please advise if above labs should be cancelled and then place appropriate future orders for upcoming visit.

## 2021-06-17 ENCOUNTER — Other Ambulatory Visit: Payer: Self-pay | Admitting: *Deleted

## 2021-06-17 DIAGNOSIS — E782 Mixed hyperlipidemia: Secondary | ICD-10-CM

## 2021-06-17 DIAGNOSIS — I10 Essential (primary) hypertension: Secondary | ICD-10-CM

## 2021-06-17 DIAGNOSIS — E559 Vitamin D deficiency, unspecified: Secondary | ICD-10-CM

## 2021-06-17 DIAGNOSIS — R739 Hyperglycemia, unspecified: Secondary | ICD-10-CM

## 2021-06-17 NOTE — Telephone Encounter (Signed)
Labs ordered and other labs taken out

## 2021-06-28 ENCOUNTER — Other Ambulatory Visit (INDEPENDENT_AMBULATORY_CARE_PROVIDER_SITE_OTHER): Payer: Medicare Other

## 2021-06-28 ENCOUNTER — Other Ambulatory Visit: Payer: Self-pay

## 2021-06-28 DIAGNOSIS — E559 Vitamin D deficiency, unspecified: Secondary | ICD-10-CM | POA: Diagnosis not present

## 2021-06-28 DIAGNOSIS — R739 Hyperglycemia, unspecified: Secondary | ICD-10-CM | POA: Diagnosis not present

## 2021-06-28 DIAGNOSIS — E782 Mixed hyperlipidemia: Secondary | ICD-10-CM | POA: Diagnosis not present

## 2021-06-28 DIAGNOSIS — I10 Essential (primary) hypertension: Secondary | ICD-10-CM

## 2021-06-29 LAB — COMPREHENSIVE METABOLIC PANEL
ALT: 16 U/L (ref 0–35)
AST: 21 U/L (ref 0–37)
Albumin: 4 g/dL (ref 3.5–5.2)
Alkaline Phosphatase: 41 U/L (ref 39–117)
BUN: 27 mg/dL — ABNORMAL HIGH (ref 6–23)
CO2: 27 mEq/L (ref 19–32)
Calcium: 9.6 mg/dL (ref 8.4–10.5)
Chloride: 108 mEq/L (ref 96–112)
Creatinine, Ser: 1.51 mg/dL — ABNORMAL HIGH (ref 0.40–1.20)
GFR: 34.13 mL/min — ABNORMAL LOW (ref >60.00)
Glucose, Bld: 79 mg/dL (ref 70–99)
Potassium: 3.9 mEq/L (ref 3.5–5.1)
Sodium: 144 mEq/L (ref 135–145)
Total Bilirubin: 0.4 mg/dL (ref 0.2–1.2)
Total Protein: 6.2 g/dL (ref 6.0–8.3)

## 2021-06-29 LAB — LIPID PANEL
Cholesterol: 121 mg/dL (ref 0–200)
HDL: 54.7 mg/dL (ref >39.00)
LDL Cholesterol: 39 mg/dL (ref 0–99)
NonHDL: 66.57
Total CHOL/HDL Ratio: 2
Triglycerides: 138 mg/dL (ref 0.0–149.0)
VLDL: 27.6 mg/dL (ref 0.0–40.0)

## 2021-06-29 LAB — VITAMIN D 25 HYDROXY (VIT D DEFICIENCY, FRACTURES): VITD: 54.22 ng/mL (ref 30.00–100.00)

## 2021-06-29 LAB — TSH: TSH: 0.1 u[IU]/mL — ABNORMAL LOW (ref 0.35–5.50)

## 2021-06-29 LAB — HEMOGLOBIN A1C: Hgb A1c MFr Bld: 5.3 % (ref 4.6–6.5)

## 2021-06-30 ENCOUNTER — Other Ambulatory Visit: Payer: Self-pay | Admitting: Pulmonary Disease

## 2021-06-30 ENCOUNTER — Other Ambulatory Visit: Payer: Self-pay | Admitting: Cardiology

## 2021-06-30 ENCOUNTER — Other Ambulatory Visit: Payer: Self-pay | Admitting: Family Medicine

## 2021-07-04 ENCOUNTER — Other Ambulatory Visit: Payer: Self-pay

## 2021-07-04 ENCOUNTER — Telehealth: Payer: Self-pay | Admitting: Family Medicine

## 2021-07-04 DIAGNOSIS — E079 Disorder of thyroid, unspecified: Secondary | ICD-10-CM

## 2021-07-04 MED ORDER — LEVOTHYROXINE SODIUM 100 MCG PO TABS: 100.0000 ug | ORAL_TABLET | Freq: Every day | ORAL | 3 refills | Status: DC

## 2021-07-04 NOTE — Telephone Encounter (Signed)
Medication sent.

## 2021-07-04 NOTE — Telephone Encounter (Signed)
Pt. States the medication below is supposed to be reduced to 182mcg.  Medication:  levothyroxine (SYNTHROID) 112 MCG tablet  Has the patient contacted their pharmacy? No. (If no, request that the patient contact the pharmacy for the refill.) (If yes, when and what did the pharmacy advise?)  Preferred Pharmacy (with phone number or street name): [name] [id number] [place], [place] - [address]  [address] [place], [place] [address]  Phone:  [phone]  Fax:  [phone]   Agent: Please be advised that RX refills may take up to 3 business days. We ask that you follow-up with your pharmacy.

## 2021-07-05 ENCOUNTER — Telehealth: Payer: Self-pay | Admitting: Family Medicine

## 2021-07-05 NOTE — Telephone Encounter (Signed)
Spoke with and scheduled appointment.

## 2021-07-05 NOTE — Addendum Note (Signed)
Addended by: Randolm Idol A on: 07/05/2021 03:57 PM   Modules accepted: Orders

## 2021-07-05 NOTE — Telephone Encounter (Signed)
Pt wanted to inform there was a problem with medication and she did not start levothyroxine (SYNTHROID) 100 MCG tablet  until this morning and was unsure if her lab appt to check her thyroid would be a month from when she was supposed to start or a month from today. Please advise.

## 2021-07-07 DIAGNOSIS — N2581 Secondary hyperparathyroidism of renal origin: Secondary | ICD-10-CM | POA: Diagnosis not present

## 2021-07-07 DIAGNOSIS — R251 Tremor, unspecified: Secondary | ICD-10-CM | POA: Diagnosis not present

## 2021-07-07 DIAGNOSIS — E785 Hyperlipidemia, unspecified: Secondary | ICD-10-CM | POA: Diagnosis not present

## 2021-07-07 DIAGNOSIS — I509 Heart failure, unspecified: Secondary | ICD-10-CM | POA: Diagnosis not present

## 2021-07-07 DIAGNOSIS — D631 Anemia in chronic kidney disease: Secondary | ICD-10-CM | POA: Diagnosis not present

## 2021-07-07 DIAGNOSIS — N184 Chronic kidney disease, stage 4 (severe): Secondary | ICD-10-CM | POA: Diagnosis not present

## 2021-07-07 DIAGNOSIS — I129 Hypertensive chronic kidney disease with stage 1 through stage 4 chronic kidney disease, or unspecified chronic kidney disease: Secondary | ICD-10-CM | POA: Diagnosis not present

## 2021-07-15 ENCOUNTER — Ambulatory Visit: Payer: Medicare Other | Admitting: Adult Health

## 2021-07-15 ENCOUNTER — Encounter: Payer: Self-pay | Admitting: Adult Health

## 2021-07-15 ENCOUNTER — Other Ambulatory Visit: Payer: Self-pay

## 2021-07-15 VITALS — BP 132/74 | HR 74 | Temp 98.7°F | Ht 62.0 in | Wt 181.0 lb

## 2021-07-15 DIAGNOSIS — J449 Chronic obstructive pulmonary disease, unspecified: Secondary | ICD-10-CM | POA: Diagnosis not present

## 2021-07-15 DIAGNOSIS — G4733 Obstructive sleep apnea (adult) (pediatric): Secondary | ICD-10-CM | POA: Diagnosis not present

## 2021-07-15 NOTE — Assessment & Plan Note (Signed)
COPD with asthma well-controlled on present regimen.  Continue on triple therapy maintenance inhaler and Singulair.  Albuterol as needed  Plan  Patient Instructions  Continue on BREZTRI 2 puffs Twice daily, rinse after use.  Singulair daily  Albuterol inhaler As needed   Activity as tolerated .  Set up for home sleep study .  Work on healthy sleep regimen .  Follow up with Dr. Elsworth Soho or Akaisha Truman NP in 6 months As needed

## 2021-07-15 NOTE — Progress Notes (Signed)
@Patient  ID: Emily Mitchell, female    DOB: 31-Oct-1945, 75 y.o.   MRN: [id number]  Chief Complaint  Patient presents with   Follow-up    Referring provider: Mosie Lukes, MD  HPI: 75 year old female former smoker followed for COPD with asthma Medical history significant for pulmonary embolism in 2014 on Coumadin until 2019 History of obstructive sleep apnea very mild in 2015 she lost a lot of weight and came off of her CPAP Chronic kidney disease stage IV followed by nephrology, history of essential tremors, diastolic heart failure  TEST/EVENTS :  PSG at [facility] 08/2011 -217 lbs -AHI 4/h, TST 319 mins,supine    PSG 07/2013 -208 lbs -showed mild OSA TST - 390 mins,  AHI  6/h, RDI of 8/h The lowest desaturation was 87%      Spirometry 04/2013: FEV1 72% predicted FEV1 FVC ratio 73 Spirometry  10/2017  severe airway obstruction with ratio 60, FEV1 of 41% and FVC of 52%    Echo 08/2014 grade 2 DD   12/2014 ONO - no desatn >> dc O2  07/15/2021 Follow up ; COPD w/ Asthma  Patient returns for a 88-month follow-up.  Patient has underlying severe COPD with asthma.  She remains on Breztri inhaler twice daily.  She says overall breathing is doing okay.  She is had no increased cough or wheezing.  Says she does try to stay active.  She rides a stationary bike most days.  She is able to be independent drive and do her housework.  She does have some chronic allergies is on Singulair daily. No flare currently  She has been fully vaccinated for COVID-19 with 5 vaccines. Flu vaccine utd.  Chest x-ray last visit showed clear lungs.  She is followed by nephrology for chronic stage IV kidney disease.  She is not on dialysis. Says last check up showed kidney function was improved.   Complains of restless sleep, snoring and wakes up unrefreshed. Falls asleep during daytime easily .  Had dream the other night that she was trying to wake herself up -felt she was having a hard time waking up .  Felt weird  afterwards. Worried she has sleep apnea,.  On neurontin, klonopin. Rarely takes Tramadol .  Goes to bed 12am and get up 11 am . Gets up 1-2 times during night .  No caffeine.  Watches TV to go to bed.  No history of stroke  Has been checked for OSA in past, Mild OSA in 2015 with AHI 6/hr .      Allergies  Allergen Reactions   Niaspan [Niacin Er] Nausea And Vomiting and Swelling    Swelling in mouth   Pantoprazole     Mouth sores   Sulfonamide Derivatives Hives   Bupropion Other (See Comments)    Uncontrollable shakes   Nitroglycerin     NOT allergic - cardiology has recommended she NOT use this due to h/o severe carotid disease as it may decrease cerebral perfusion   Penicillins Hives    DID THE REACTION INVOLVE: Swelling of the face/tongue/throat, SOB, or low BP? Yes Sudden or severe rash/hives, skin peeling, or the inside of the mouth or nose? Unknown Did it require medical treatment? Unknown When did it last happen?   teen  If all above answers are NO, may proceed with cephalosporin use.   Statins    Apple Rash   Banana Rash    Immunization History  Administered Date(s) Administered   Fluad Quad(high Dose 65+)  04/17/2019, 04/14/2020   Influenza Split 04/29/2013   Influenza, High Dose Seasonal PF 03/30/2016, 04/19/2017, 05/02/2018   Influenza,inj,Quad PF,6+ Mos 03/26/2014, 05/17/2015   Influenza-Unspecified 06/14/2021   PFIZER(Purple Top)SARS-COV-2 Vaccination 10/19/2019, 11/10/2019, 05/08/2020, 10/30/2020   Pneumococcal Conjugate-13 04/29/2013   Pneumococcal Polysaccharide-23 04/30/2014, 04/17/2019   Tdap 05/26/2013   Zoster Recombinat (Shingrix) 04/27/2017, 06/13/2017   Zoster, Live 07/31/2012    Past Medical History:  Diagnosis Date   Acid reflux    Anemia 07/05/2014   Anxiety and depression 12/14/2008   Qualifier: Diagnosis of  By: [name] LPN, Almyra Free     Arthritis    "fingers, toes, knees, joints" (07/18/2018)   Asthma    Atherosclerotic heart disease  of native coronary artery without angina pectoris 07/19/2018   Atypical chest pain 05/23/2015   Back pain 02/02/2019   Breast mass, left 04/28/2019   CAD in native artery 07/19/2018   Candidal skin infection 02/28/2018   Carotid artery disease ([place]) 06/16/2015   S/p CEA    Carotid artery occlusion    a. R carotid occluded, 04-54% LICA.   Chest pain 07/18/2018   CHF (congestive heart failure) (HCC)    Chicken pox as a child   Chronic kidney disease    Bright's Disease at age 66    Chronic obstructive pulmonary disease ([place]) 12/14/2008   Formatting of this note might be different from the original. Overview:  Spirometry 05/12/2013: FEV1 72% predicted FEV1 FVC ratio 73 predicted FEF 25 75  43% predicted  Last Assessment & Plan:  OK to stop taking advair Use albuterol as needed only Check oxygen levels during sleep - we will call you with results Call if symptoms worse   Chronic rhinitis 06/14/2018   CKD (chronic kidney disease), stage IV ([place])    Follows with [facility], Dr Alonza Smoker    Congestive heart failure ([place]) 11/30/2015   Formatting of this note might be different from the original. Last Assessment & Plan:  No recent exacerbation   Constipation 12/17/2008   Qualifier: Diagnosis of  By: Westly Pam    COPD with asthma ([place]) 11/06/2017   Essential hypertension 10/18/2015   Formatting of this note might be different from the original. Last Assessment & Plan:  poorlycontrolled and under a great deal of stress, add Metoprolol XL 25 mg daily. Encouraged heart healthy diet such as the DASH diet and exercise as tolerated.   Essential tremor 11/05/2013   Exudative age-related macular degeneration of right eye with active choroidal neovascularization ([place]) 08/26/2015   Gastro-esophageal reflux disease without esophagitis 12/14/2008   Formatting of this note might be different from the original. Formatting of this note might be different from the original. Formatting of  this note might be different from the original. Overview:  Qualifier: Diagnosis of  By: [name] LPN, Almyra Free   Last Assessment & Plan:  Avoid offending foods, start probiotics. Do not eat large meals in late evening and consider raising head of bed. Last Assessment   GERD (gastroesophageal reflux disease)    H. pylori infection 10/19/2013   H/O multiple allergies 09/81/1914   Helicobacter pylori (H. pylori) as the cause of diseases classified elsewhere 10/19/2013   Formatting of this note might be different from the original. Last Assessment & Plan:  Tetracycline, Pepto Bismol, Flagyl and Omeprazole.   Hepatitis 1970s   "don't know for sure which one it was; think it was A" (07/18/2018)   History of food allergy 08/14/2018   History of pulmonary embolism  07/17/2018   Hoarseness 03/09/2019   Hypercalcemia 03/09/2019   Hyperglycemia 03/14/2016   Hyperlipidemia, mixed 05/26/2013   Crestor caused N/V Did not tolerate Lipitor    Hypertension    Hypothyroidism    Knee pain, bilateral 12/17/2012   Left hip pain 05/23/2015   Low back pain 09/19/2015   Lower urinary tract infectious disease 10/19/2013   Lymphadenopathy 10/18/2017   Macular degeneration of both eyes 12/16/2015   Right eye is wet Left Eye is dry Shots every 11 to 12 weeks in right eye at opthamologist office   Medicare annual wellness visit, subsequent 02/21/2015   Mixed anxiety depressive disorder 12/14/2008   Formatting of this note might be different from the original. Overview:  Qualifier: Diagnosis of  By: [name] LPN, Almyra Free  Patient had increased  Tremors on Bupropion  Last Assessment & Plan:  Is struggling with the stress of her 38 year old granddaughter whom she raised moving back with her own mother, the patient is estranged from her daughter so she is very upset. Increase Citalopram 40 mg daily    Mumps 75 yrs old   Neck pain 04/22/2017   Numbness of finger 12/17/2012   Occlusion and stenosis of carotid artery without  mention of cerebral infarction 12/17/2012   OSA (obstructive sleep apnea) 12/14/2008   PSG  06/2013-AHI 6/h 08/2011 ([facility]) AHI 4/h No longer needs CPAP    Preventative health care 03/14/2016   Pulmonary emboli ([place]) 05/26/2013   Renal insufficiency 07/17/2018   Right shoulder pain 10/18/2017   Sensation of foreign body in larynx 04/08/2019   Skin lesion of back 03/27/2021   Sleep apnea    "don't use CPAP anymore; dr said I didn't have to" (07/18/2018)   Static tremor 11/05/2013   Formatting of this note might be different from the original. Last Assessment & Plan:  Follows with [facility]   Statin intolerance 09/29/2019   Stroke [facility])    "been told I've had some strokes; didn't know I'd had them" (07/18/2018)   Thyroid disease    Tremor    Vitamin D deficiency 03/03/2018   Vitreomacular adhesion of both eyes 01/07/2019   Formatting of this note might be different from the original. no traction    Tobacco History: Social History   Tobacco Use  Smoking Status Former   Packs/day: 2.00   Years: 30.00   Pack years: 60.00   Types: Cigarettes   Quit date: 12/03/1994   Years since quitting: 26.6  Smokeless Tobacco Never   Counseling given: Not Answered   Outpatient Medications Prior to Visit  Medication Sig Dispense Refill   acetaminophen (TYLENOL) 500 MG tablet Take 500 mg by mouth as needed for headache.     albuterol (VENTOLIN HFA) 108 (90 Base) MCG/ACT inhaler Inhale 2 puffs into the lungs every 6 (six) hours as needed for wheezing or shortness of breath. 18 g 3   aspirin EC 81 MG tablet Take 1 tablet (81 mg total) by mouth daily. 90 tablet 3   atorvastatin (LIPITOR) 10 MG tablet TAKE ONE TABLET BY MOUTH DAILY 90 tablet 1   BREZTRI AEROSPHERE 160-9-4.8 MCG/ACT AERO INHALE TWO PUFFS BY MOUTH TWICE A DAY IN THE MORNING AND IN THE EVENING 10.7 g 5   Calcium Citrate-Vitamin D (CALCIUM + D PO) Take 1 tablet by mouth daily.     Calcium Citrate-Vitamin D 200-250 MG-UNIT TABS Take  1 tablet by mouth daily.     Cholecalciferol 25 MCG (1000 UT) tablet Take  1,000 Units by mouth daily.     Choline Fenofibrate (FENOFIBRIC ACID) 135 MG CPDR TAKE ONE CAPSULE BY MOUTH DAILY 90 capsule 1   clonazePAM (KLONOPIN) 1 MG tablet TAKE ONE TABLET BY MOUTH THREE TIMES A DAY AS NEEDED FOR ANXIETY 90 tablet 1   clopidogrel (PLAVIX) 75 MG tablet TAKE ONE TABLET BY MOUTH DAILY 90 tablet 3   cyanocobalamin 100 MCG tablet Take 500 mcg by mouth daily.     ezetimibe (ZETIA) 10 MG tablet TAKE ONE TABLET BY MOUTH DAILY 90 tablet 1   famotidine (PEPCID) 40 MG tablet TAKE ONE TABLET BY MOUTH DAILY 90 tablet 1   FLUoxetine (PROZAC) 20 MG tablet TAKE ONE TABLET BY MOUTH DAILY 90 tablet 1   gabapentin (NEURONTIN) 100 MG capsule TAKE TWO CAPSULES BY MOUTH EVERY NIGHT AT BEDTIME 180 capsule 1   levothyroxine (SYNTHROID) 100 MCG tablet Take 1 tablet (100 mcg total) by mouth daily. 90 tablet 3   montelukast (SINGULAIR) 10 MG tablet Take 10 mg by mouth at bedtime as needed for allergies.     Multiple Vitamins-Minerals (PRESERVISION AREDS 2 PO) Take 1 capsule by mouth daily.     NON FORMULARY Take 1 tablet by mouth daily. Digestive advantage.     Omega-3 Fatty Acids (FISH OIL PO) Take 1 capsule by mouth daily.     Probiotic Product (VSL#3) CAPS Take 1 capsule by mouth daily.     traMADol (ULTRAM) 50 MG tablet Take 1 tablet (50 mg total) by mouth every 8 (eight) hours as needed for moderate pain or severe pain. 90 tablet 0   No facility-administered medications prior to visit.     Review of Systems:   Constitutional:   No  weight loss, night sweats,  Fevers, chills,  +fatigue, or  lassitude.  HEENT:   No headaches,  Difficulty swallowing,  Tooth/dental problems, or  Sore throat,                No sneezing, itching, ear ache, nasal congestion, post nasal drip,   CV:  No chest pain,  Orthopnea, PND, swelling in lower extremities, anasarca, dizziness, palpitations, syncope.   GI  No heartburn,  indigestion, abdominal pain, nausea, vomiting, diarrhea, change in bowel habits, loss of appetite, bloody stools.   Resp: .  No chest wall deformity  Skin: no rash or lesions.  GU: no dysuria, change in color of urine, no urgency or frequency.  No flank pain, no hematuria   MS:  No joint pain or swelling.  No decreased range of motion.  No back pain.    Physical Exam  BP 132/74 (BP Location: Left Arm, Patient Position: Sitting, Cuff Size: Normal)    Pulse 74    Temp 98.7 F (37.1 C) (Oral)    Ht 5\' 2"  (1.575 m)    Wt 181 lb (82.1 kg)    SpO2 100%    BMI 33.11 kg/m   GEN: A/Ox3; pleasant , NAD, well nourished    HEENT:  [place]/AT,   NOSE-clear, THROAT-clear, no lesions, no postnasal drip or exudate noted.  Poor dentition , Class 3 MP airway   NECK:  Supple w/ fair ROM; no JVD; normal carotid impulses w/o bruits; no thyromegaly or nodules palpated; no lymphadenopathy.    RESP  Clear  P & A; w/o, wheezes/ rales/ or rhonchi. no accessory muscle use, no dullness to percussion  CARD:  RRR, no m/r/g, tr  peripheral edema, pulses intact, no cyanosis or clubbing.  GI:  Soft & nt; nml bowel sounds; no organomegaly or masses detected.   Musco: Warm bil, no deformities or joint swelling noted.   Neuro: alert, no focal deficits noted.    Skin: Warm, no lesions or rashes    Lab Results:  CBC    Component Value Date/Time   WBC 7.6 03/24/2021 1456   RBC 4.26 03/24/2021 1456   HGB 12.6 03/24/2021 1456   HGB 12.3 07/17/2018 1508   HCT 38.6 03/24/2021 1456   HCT 37.2 07/17/2018 1508   PLT 295.0 03/24/2021 1456   PLT 275 07/17/2018 1508   MCV 90.6 03/24/2021 1456   MCV 87 07/17/2018 1508   MCH 30.0 02/15/2021 1018   MCHC 32.7 03/24/2021 1456   RDW 14.4 03/24/2021 1456   RDW 13.2 07/17/2018 1508   LYMPHSABS 0.9 02/17/2021 1459   LYMPHSABS 2.0 07/17/2018 1508   MONOABS 0.8 02/17/2021 1459   EOSABS 0.1 02/17/2021 1459   EOSABS 0.2 07/17/2018 1508   BASOSABS 0.0 02/17/2021 1459    BASOSABS 0.1 07/17/2018 1508    BMET    Component Value Date/Time   NA 144 06/28/2021 1406   NA 140 07/17/2018 1508   K 3.9 06/28/2021 1406   CL 108 06/28/2021 1406   CO2 27 06/28/2021 1406   GLUCOSE 79 06/28/2021 1406   BUN 27 (H) 06/28/2021 1406   BUN 37 (H) 07/17/2018 1508   CREATININE 1.51 (H) 06/28/2021 1406   CREATININE 2.45 (H) 12/12/2013 1521   CALCIUM 9.6 06/28/2021 1406   CALCIUM 9.5 11/25/2009 1346   GFRNONAA 34 (L) 02/15/2021 1018   GFRAA 32 (L) 03/15/2019 2039    BNP    Component Value Date/Time   BNP 38.9 09/04/2018 2258    ProBNP    Component Value Date/Time   PROBNP 60.7 02/25/2007 1515    Imaging: No results found.    No flowsheet data found.  No results found for: NITRICOXIDE      Assessment & Plan:   OSA (obstructive sleep apnea) History of  sleep apnea in the past. Now with ongoing symptoms with snoring daytime sleepiness-  Suspicious for possible worsening sleep apnea.  We will check a home sleep study. Encouraged on healthy sleep regimen.  COPD with asthma ([place]) COPD with asthma well-controlled on present regimen.  Continue on triple therapy maintenance inhaler and Singulair.  Albuterol as needed  Plan  Patient Instructions  Continue on BREZTRI 2 puffs Twice daily, rinse after use.  Singulair daily  Albuterol inhaler As needed   Activity as tolerated .  Set up for home sleep study .  Work on healthy sleep regimen .  Follow up with Dr. Elsworth Soho or Verle Wheeling NP in 6 months As needed          Rexene Edison, NP 07/15/2021

## 2021-07-15 NOTE — Assessment & Plan Note (Signed)
History of  sleep apnea in the past. Now with ongoing symptoms with snoring daytime sleepiness-  Suspicious for possible worsening sleep apnea.  We will check a home sleep study. Encouraged on healthy sleep regimen.

## 2021-07-15 NOTE — Patient Instructions (Addendum)
Continue on BREZTRI 2 puffs Twice daily, rinse after use.  Singulair daily  Albuterol inhaler As needed   Activity as tolerated .  Set up for home sleep study .  Work on healthy sleep regimen .  Follow up with Dr. Elsworth Soho or Dru Laurel NP in 6 months As needed

## 2021-07-19 ENCOUNTER — Ambulatory Visit (INDEPENDENT_AMBULATORY_CARE_PROVIDER_SITE_OTHER): Payer: Medicare Other | Admitting: Family Medicine

## 2021-07-19 ENCOUNTER — Encounter: Payer: Self-pay | Admitting: Family Medicine

## 2021-07-19 VITALS — BP 122/76 | HR 85 | Temp 98.2°F | Resp 16 | Ht 63.0 in | Wt 180.0 lb

## 2021-07-19 DIAGNOSIS — E559 Vitamin D deficiency, unspecified: Secondary | ICD-10-CM | POA: Diagnosis not present

## 2021-07-19 DIAGNOSIS — N289 Disorder of kidney and ureter, unspecified: Secondary | ICD-10-CM

## 2021-07-19 DIAGNOSIS — M25561 Pain in right knee: Secondary | ICD-10-CM | POA: Diagnosis not present

## 2021-07-19 DIAGNOSIS — E079 Disorder of thyroid, unspecified: Secondary | ICD-10-CM

## 2021-07-19 DIAGNOSIS — R739 Hyperglycemia, unspecified: Secondary | ICD-10-CM

## 2021-07-19 DIAGNOSIS — G250 Essential tremor: Secondary | ICD-10-CM | POA: Diagnosis not present

## 2021-07-19 DIAGNOSIS — E782 Mixed hyperlipidemia: Secondary | ICD-10-CM

## 2021-07-19 DIAGNOSIS — L989 Disorder of the skin and subcutaneous tissue, unspecified: Secondary | ICD-10-CM | POA: Diagnosis not present

## 2021-07-19 DIAGNOSIS — I10 Essential (primary) hypertension: Secondary | ICD-10-CM

## 2021-07-19 DIAGNOSIS — L089 Local infection of the skin and subcutaneous tissue, unspecified: Secondary | ICD-10-CM

## 2021-07-19 DIAGNOSIS — M1990 Unspecified osteoarthritis, unspecified site: Secondary | ICD-10-CM

## 2021-07-19 DIAGNOSIS — M25562 Pain in left knee: Secondary | ICD-10-CM

## 2021-07-19 HISTORY — DX: Local infection of the skin and subcutaneous tissue, unspecified: L08.9

## 2021-07-19 NOTE — Assessment & Plan Note (Signed)
Improved to stage 3 is following with [facility]

## 2021-07-19 NOTE — Assessment & Plan Note (Signed)
Small, stable cystic structure to left of spine, referred for excision.

## 2021-07-19 NOTE — Assessment & Plan Note (Signed)
Supplement and monitor 

## 2021-07-19 NOTE — Assessment & Plan Note (Signed)
Well controlled, no changes to meds. Encouraged heart healthy diet such as the DASH diet and exercise as tolerated.  °

## 2021-07-19 NOTE — Assessment & Plan Note (Signed)
Tolerating statin, encouraged heart healthy diet, avoid trans fats, minimize simple carbs and saturated fats. Increase exercise as tolerated 

## 2021-07-19 NOTE — Assessment & Plan Note (Signed)
unchanged

## 2021-07-19 NOTE — Assessment & Plan Note (Signed)
On Levothyroxine, continue to monitor 

## 2021-07-19 NOTE — Assessment & Plan Note (Signed)
hgba1c acceptable, minimize simple carbs. Increase exercise as tolerated.  

## 2021-07-19 NOTE — Progress Notes (Signed)
Patient ID: Emily Mitchell, female    DOB: [birth year]  Age: 75 y.o. MRN: [id number]    Subjective:   No chief complaint on file.  Subjective   HPI JAVONNA BALLI presents for office visit today for follow up on Kidney disease and HTN. She states that she is doing well and has no recent febrile illnesses or ER visits to report. She reports that she is down from stage 4 to stage 3 kidney disease according th her last visit to nephrology with [facility]. Denies CP/palp/SOB/HA/congestion/fevers/GI or GU c/o. Taking meds as prescribed.  She expresses concern about a lesion that is reoccurring under her right breast. She last flare up she had was a month before her latest mammogram. She describes the lesion as an inflamed whitehead. At the moment, she states that she does not have any lesions under her breast. She states that she also has a lesion local to her left lower back.  She has c/o worsening tremors in right hand compared to her left hand and left elbow pain. She endorses that she always had bilateral tremors in wrists and the changes she is currently experiencing are new.   Review of Systems  Constitutional:  Negative for chills, fatigue and fever.  HENT:  Negative for congestion, rhinorrhea, sinus pressure, sinus pain and sore throat.   Eyes:  Negative for pain.  Respiratory:  Negative for cough and shortness of breath.   Cardiovascular:  Negative for chest pain, palpitations and leg swelling.  Gastrointestinal:  Negative for abdominal pain, blood in stool, diarrhea, nausea and vomiting.  Genitourinary:  Negative for decreased urine volume, flank pain, frequency, vaginal bleeding and vaginal discharge.  Musculoskeletal:  Negative for back pain.  Neurological:  Positive for tremors. Negative for headaches.   History Past Medical History:  Diagnosis Date   Acid reflux    Anemia 07/05/2014   Anxiety and depression 12/14/2008   Qualifier: Diagnosis of  By: [name] LPN, Almyra Free      Arthritis    "fingers, toes, knees, joints" (07/18/2018)   Asthma    Atherosclerotic heart disease of native coronary artery without angina pectoris 07/19/2018   Atypical chest pain 05/23/2015   Back pain 02/02/2019   Breast mass, left 04/28/2019   CAD in native artery 07/19/2018   Candidal skin infection 02/28/2018   Carotid artery disease ([place]) 06/16/2015   S/p CEA    Carotid artery occlusion    a. R carotid occluded, 35-57% LICA.   Chest pain 07/18/2018   CHF (congestive heart failure) (HCC)    Chicken pox as a child   Chronic kidney disease    Bright's Disease at age 75    Chronic obstructive pulmonary disease ([place]) 12/14/2008   Formatting of this note might be different from the original. Overview:  Spirometry 05/12/2013: FEV1 72% predicted FEV1 FVC ratio 73 predicted FEF 25 75  43% predicted  Last Assessment & Plan:  OK to stop taking advair Use albuterol as needed only Check oxygen levels during sleep - we will call you with results Call if symptoms worse   Chronic rhinitis 06/14/2018   CKD (chronic kidney disease), stage IV ([place])    Follows with [facility], Dr Alonza Smoker    Congestive heart failure ([place]) 11/30/2015   Formatting of this note might be different from the original. Last Assessment & Plan:  No recent exacerbation   Constipation 12/17/2008   Qualifier: Diagnosis of  By: Westly Pam.    COPD  with asthma ([place]) 11/06/2017   Essential hypertension 10/18/2015   Formatting of this note might be different from the original. Last Assessment & Plan:  poorlycontrolled and under a great deal of stress, add Metoprolol XL 25 mg daily. Encouraged heart healthy diet such as the DASH diet and exercise as tolerated.   Essential tremor 11/05/2013   Exudative age-related macular degeneration of right eye with active choroidal neovascularization ([place]) 08/26/2015   Gastro-esophageal reflux disease without esophagitis 12/14/2008   Formatting of this note might be  different from the original. Formatting of this note might be different from the original. Formatting of this note might be different from the original. Overview:  Qualifier: Diagnosis of  By: [name] LPN, Almyra Free   Last Assessment & Plan:  Avoid offending foods, start probiotics. Do not eat large meals in late evening and consider raising head of bed. Last Assessment   GERD (gastroesophageal reflux disease)    H. pylori infection 10/19/2013   H/O multiple allergies 16/05/9603   Helicobacter pylori (H. pylori) as the cause of diseases classified elsewhere 10/19/2013   Formatting of this note might be different from the original. Last Assessment & Plan:  Tetracycline, Pepto Bismol, Flagyl and Omeprazole.   Hepatitis 1970s   "don't know for sure which one it was; think it was A" (07/18/2018)   History of food allergy 08/14/2018   History of pulmonary embolism 07/17/2018   Hoarseness 03/09/2019   Hypercalcemia 03/09/2019   Hyperglycemia 03/14/2016   Hyperlipidemia, mixed 05/26/2013   Crestor caused N/V Did not tolerate Lipitor    Hypertension    Hypothyroidism    Knee pain, bilateral 12/17/2012   Left hip pain 05/23/2015   Low back pain 09/19/2015   Lower urinary tract infectious disease 10/19/2013   Lymphadenopathy 10/18/2017   Macular degeneration of both eyes 12/16/2015   Right eye is wet Left Eye is dry Shots every 11 to 12 weeks in right eye at opthamologist office   Medicare annual wellness visit, subsequent 02/21/2015   Mixed anxiety depressive disorder 12/14/2008   Formatting of this note might be different from the original. Overview:  Qualifier: Diagnosis of  By: [name] LPN, Almyra Free  Patient had increased  Tremors on Bupropion  Last Assessment & Plan:  Is struggling with the stress of her 73 year old granddaughter whom she raised moving back with her own mother, the patient is estranged from her daughter so she is very upset. Increase Citalopram 40 mg daily    Mumps 75 yrs old   Neck  pain 04/22/2017   Numbness of finger 12/17/2012   Occlusion and stenosis of carotid artery without mention of cerebral infarction 12/17/2012   OSA (obstructive sleep apnea) 12/14/2008   PSG  06/2013-AHI 6/h 08/2011 ([facility]) AHI 4/h No longer needs CPAP    Preventative health care 03/14/2016   Pulmonary emboli ([place]) 05/26/2013   Renal insufficiency 07/17/2018   Right shoulder pain 10/18/2017   Sensation of foreign body in larynx 04/08/2019   Skin lesion of back 03/27/2021   Sleep apnea    "don't use CPAP anymore; dr said I didn't have to" (07/18/2018)   Static tremor 11/05/2013   Formatting of this note might be different from the original. Last Assessment & Plan:  Follows with [facility]   Statin intolerance 09/29/2019   Stroke [facility])    "been told I've had some strokes; didn't know I'd had them" (07/18/2018)   Thyroid disease    Tremor    Vitamin D deficiency  03/03/2018   Vitreomacular adhesion of both eyes 01/07/2019   Formatting of this note might be different from the original. no traction    She has a past surgical history that includes Carotid endarterectomy (Right, 05/10/07); Wisdom tooth extraction; Cataract extraction (Left); Total knee arthroplasty (Bilateral, 2005 - 2011); Joint replacement; Appendectomy (1984); Vaginal hysterectomy (1984); Cataract extraction w/ intraocular lens  implant, bilateral (Bilateral); Multiple tooth extractions; Coronary angioplasty with stent (07/18/2018); Cardiac catheterization (~ 2006); LEFT HEART CATH AND CORONARY ANGIOGRAPHY (N/A, 07/18/2018); INTRAVASCULAR PRESSURE WIRE/FFR STUDY (N/A, 07/18/2018); CORONARY STENT INTERVENTION (N/A, 07/18/2018); and CAROTID ANGIOGRAPHY (Left, 08/20/2018).   Her family history includes Allergic rhinitis in her sister; Diabetes in her sister; Heart attack in her maternal grandfather; Heart failure in her mother; Hyperlipidemia in her sister; Hypertension in her mother; Kidney disease in her mother and sister;  Stroke in her mother.She reports that she quit smoking about 26 years ago. Her smoking use included cigarettes. She has a 60.00 pack-year smoking history. She has never used smokeless tobacco. She reports current alcohol use. She reports that she does not use drugs.  Current Outpatient Medications on File Prior to Visit  Medication Sig Dispense Refill   acetaminophen (TYLENOL) 500 MG tablet Take 500 mg by mouth as needed for headache.     albuterol (VENTOLIN HFA) 108 (90 Base) MCG/ACT inhaler Inhale 2 puffs into the lungs every 6 (six) hours as needed for wheezing or shortness of breath. 18 g 3   aspirin EC 81 MG tablet Take 1 tablet (81 mg total) by mouth daily. 90 tablet 3   atorvastatin (LIPITOR) 10 MG tablet TAKE ONE TABLET BY MOUTH DAILY 90 tablet 1   BREZTRI AEROSPHERE 160-9-4.8 MCG/ACT AERO INHALE TWO PUFFS BY MOUTH TWICE A DAY IN THE MORNING AND IN THE EVENING 10.7 g 5   Calcium Citrate-Vitamin D (CALCIUM + D PO) Take 1 tablet by mouth daily.     Calcium Citrate-Vitamin D 200-250 MG-UNIT TABS Take 1 tablet by mouth daily.     Cholecalciferol 25 MCG (1000 UT) tablet Take 1,000 Units by mouth daily.     Choline Fenofibrate (FENOFIBRIC ACID) 135 MG CPDR TAKE ONE CAPSULE BY MOUTH DAILY 90 capsule 1   clonazePAM (KLONOPIN) 1 MG tablet TAKE ONE TABLET BY MOUTH THREE TIMES A DAY AS NEEDED FOR ANXIETY 90 tablet 1   clopidogrel (PLAVIX) 75 MG tablet TAKE ONE TABLET BY MOUTH DAILY 90 tablet 3   cyanocobalamin 100 MCG tablet Take 500 mcg by mouth daily.     ezetimibe (ZETIA) 10 MG tablet TAKE ONE TABLET BY MOUTH DAILY 90 tablet 1   famotidine (PEPCID) 40 MG tablet TAKE ONE TABLET BY MOUTH DAILY 90 tablet 1   FLUoxetine (PROZAC) 20 MG tablet TAKE ONE TABLET BY MOUTH DAILY 90 tablet 1   gabapentin (NEURONTIN) 100 MG capsule TAKE TWO CAPSULES BY MOUTH EVERY NIGHT AT BEDTIME 180 capsule 1   levothyroxine (SYNTHROID) 100 MCG tablet Take 1 tablet (100 mcg total) by mouth daily. 90 tablet 3   montelukast  (SINGULAIR) 10 MG tablet Take 10 mg by mouth at bedtime as needed for allergies.     Multiple Vitamins-Minerals (PRESERVISION AREDS 2 PO) Take 1 capsule by mouth daily.     NON FORMULARY Take 1 tablet by mouth daily. Digestive advantage.     Omega-3 Fatty Acids (FISH OIL PO) Take 1 capsule by mouth daily.     Probiotic Product (VSL#3) CAPS Take 1 capsule by mouth daily.  traMADol (ULTRAM) 50 MG tablet Take 1 tablet (50 mg total) by mouth every 8 (eight) hours as needed for moderate pain or severe pain. 90 tablet 0   No current facility-administered medications on file prior to visit.     Objective:  Objective  Physical Exam Constitutional:      General: She is not in acute distress.    Appearance: Normal appearance. She is not ill-appearing or toxic-appearing.  HENT:     Head: Normocephalic and atraumatic.     Right Ear: Tympanic membrane, ear canal and external ear normal.     Left Ear: Tympanic membrane, ear canal and external ear normal.     Nose: No congestion or rhinorrhea.  Eyes:     Extraocular Movements: Extraocular movements intact.     Pupils: Pupils are equal, round, and reactive to light.  Cardiovascular:     Rate and Rhythm: Normal rate and regular rhythm.     Pulses: Normal pulses.     Heart sounds: Normal heart sounds. No murmur heard. Pulmonary:     Effort: Pulmonary effort is normal. No respiratory distress.     Breath sounds: Normal breath sounds. No wheezing, rhonchi or rales.  Abdominal:     General: Bowel sounds are normal.     Palpations: Abdomen is soft. There is no mass.     Tenderness: There is no abdominal tenderness. There is no guarding.     Hernia: No hernia is present.  Musculoskeletal:        General: Normal range of motion.     Cervical back: Normal range of motion and neck supple.  Skin:    General: Skin is warm and dry.     Findings: Lesion (Raised firm and glossy lesion to the left of the spine at about L2-L3) present.  Neurological:      Mental Status: She is alert and oriented to person, place, and time.  Psychiatric:        Behavior: Behavior normal.   BP 122/76    Pulse 85    Temp 98.2 F (36.8 C)    Resp 16    Ht 5\' 3"  (1.6 m)    Wt 180 lb (81.6 kg)    SpO2 95%    BMI 31.89 kg/m  Wt Readings from Last 3 Encounters:  07/19/21 180 lb (81.6 kg)  07/15/21 181 lb (82.1 kg)  06/13/21 181 lb (82.1 kg)     Lab Results  Component Value Date   WBC 7.6 03/24/2021   HGB 12.6 03/24/2021   HCT 38.6 03/24/2021   PLT 295.0 03/24/2021   GLUCOSE 79 06/28/2021   CHOL 121 06/28/2021   TRIG 138.0 06/28/2021   HDL 54.70 06/28/2021   LDLDIRECT 103.0 07/14/2019   LDLCALC 39 06/28/2021   ALT 16 06/28/2021   AST 21 06/28/2021   NA 144 06/28/2021   K 3.9 06/28/2021   CL 108 06/28/2021   CREATININE 1.51 (H) 06/28/2021   BUN 27 (H) 06/28/2021   CO2 27 06/28/2021   TSH 0.10 (L) 06/28/2021   INR 1.79 07/18/2018   HGBA1C 5.3 06/28/2021    DEXAScan  Result Date: 05/30/2021 EXAM: DUAL X-RAY ABSORPTIOMETRY (DXA) FOR BONE MINERAL DENSITY IMPRESSION: Taisley Mordan A Sammy Cassar Your patient Louine Tenpenny completed a BMD test on 05/12/2021 using the [place] (analysis version: 16.SP2) manufactured by [employer]. The following summarizes the results of our evaluation. [facility] PATIENT: Name: Danea, Manter Patient ID: [id number] Birth Date: Jan 19, 1946 Height: 62.5 in.  Gender: Female Measured: 05/12/2021 Weight: 181.4 lbs. Indications: Advanced Age, Caucasian, Early Menopause, Estrogen Deficiency, History of Osteopenia, Hypothyroidism, Hysterectomy, Post Menopausal, Previous Tobacco User Fractures: Treatments: Calcium, Levothyroxine, Vitamin D ASSESSMENT: The BMD measured at Femur Neck Right is 0.836 g/cm2 with a T-score of -1.5. This patient is considered osteopenic according to [facility]) criteria. Compared with the prior study on, 03/14/2018 the BMD of the total mean shows no statistically signficant change. The scan  quality is good. Site Region Measured Date Measured Age WHO YA BMD Classification T-score AP Spine L1-L4 05/12/2023 73.1 Low Bone Mass -1.4 1.010 g/cm2 AP Spine L1-L4 03/14/2018 70.0 Low Bone Mass -1.5 0.994 g/cm2 AP Spine L1-L4 03/24/2015 67.0 Low Bone Mass -1.3 1.029 g/cm2 DualFemur Neck Right 05/12/2021 73.1 Low Bone Mass -1.5 0.836 g/cm2 DualFemur Neck Right 03/14/2018 70.0 Low Bone Mass -1.2 0.876 g/cm2 DualFemur Neck Right 03/24/2015 67.0 Low Bone Mass -1.2 0.871 g/cm2 DualFemur Total Mean 05/12/2021 73.1 Low Bone Mass -1.2 0.856 g/cm2 DualFemur Total Mean 03/14/2018 70.0 Low Bone Mass -1.4 0.834 g/cm2 DualFemur Total Mean 03/24/2015 67.0 Normal -0.9 0.889 g/cm2 World Health Organization [facility]) criteria for post-menopausal, Caucasian Women: Normal        T-score at or above -1 SD Low Bone Mass T-score between -1 and -2.5 SD Osteoporosis  T-score at or below -2.5 SD RECOMMENDATION: 1. All patients should optimize calcium and vitamin D intake. 2. Consider FDA-approved medical therapies in postmenopausal women and men aged 72 years and older, based on the following: a. A hip or vertebral(clinical or morphometric) fracture. b. T-Score < -2.5 at the femoral neck or spine after appropriate evaluation to exclude secondary causes c. Low bone mass (T-score between -1.0 and -2.5 at the femoral neck or spine) and a 10 year probability of a hip fracture >3% or a 10 year probability of major osteoporosis-related fracture > 20% based on the [place]-adapted WHO algorithm d. Clinical judgement and/or patient preferences may indicate treatment for people with 10-year fracture probabilities above or below these levels FOLLOW-UP: Patients with diagnosis of osteoporosis or at high risk for fracture should have regular bone mineral density tests. For patients eligible for Medicare, routine testing is allowed once every 2 years. The testing frequency can be increased to one year for patients who have rapidly progressing disease, those  who are receiving or discontinuing medical therapy to restore bone mass, or have additional risk factors. I have reviewed this report, and agree with the above findings. [facility] Radiology Electronically Signed   By: Elmer Picker M.D.   On: 05/30/2021 16:47     Assessment & Plan:  Plan    No orders of the defined types were placed in this encounter.   Problem List Items Addressed This Visit     Knee pain, bilateral - Primary   Relevant Orders   Rheumatoid Factor   Arthritis   Relevant Orders   Rheumatoid Factor   Thyroid disease    On Levothyroxine, continue to monitor      Hyperlipidemia, mixed    Tolerating statin, encouraged heart healthy diet, avoid trans fats, minimize simple carbs and saturated fats. Increase exercise as tolerated      Essential tremor    unchanged      Essential hypertension    Well controlled, no changes to meds. Encouraged heart healthy diet such as the DASH diet and exercise as tolerated.       Hyperglycemia    hgba1c acceptable, minimize simple carbs. Increase exercise as tolerated.  Vitamin D deficiency    Supplement and monitor      Renal insufficiency    Improved to stage 3 is following with [facility]      Skin lesion of back    Small, stable cystic structure to left of spine, referred for excision.       Recurrent infection of skin    3 x infected under right breast, calm at the moment will refer to general surgery for possible excision      Relevant Orders   Ambulatory referral to General Surgery    Follow-up: No follow-ups on file.  I, Suezanne Jacquet, acting as a scribe for Penni Homans, MD, have documented all relevent documentation on behalf of Penni Homans, MD, as directed by Penni Homans, MD while in the presence of Penni Homans, MD. DO:07/20/21.  I, Mosie Lukes, MD personally performed the services described in this documentation. All medical record entries made by the scribe were at my direction  and in my presence. I have reviewed the chart and agree that the record reflects my personal performance and is accurate and complete

## 2021-07-19 NOTE — Patient Instructions (Signed)
Osteoarthritis Osteoarthritis is a type of arthritis. It refers to joint pain or joint disease. Osteoarthritis affects tissue that covers the ends of bones in joints (cartilage). Cartilage acts as a cushion between the bones and helps them move smoothly. Osteoarthritis occurs when cartilage in the joints gets worn down. Osteoarthritis is sometimes called "wear and tear" arthritis. Osteoarthritis is the most common form of arthritis. It often occurs in older people. It is a condition that gets worse over time. The joints most often affected by this condition are in the fingers, toes, hips, knees, and spine, including the neck and lower back. What are the causes? This condition is caused by the wearing down of cartilage that covers the ends of bones. What increases the risk? The following factors may make you more likely to develop this condition: Being age 50 or older. Obesity. Overuse of joints. Past injury of a joint. Past surgery on a joint. Family history of osteoarthritis. What are the signs or symptoms? The main symptoms of this condition are pain, swelling, and stiffness in the joint. Other symptoms may include: An enlarged joint. More pain and further damage caused by small pieces of bone or cartilage that break off and float inside of the joint. Small deposits of bone (osteophytes) that grow on the edges of the joint. A grating or scraping feeling inside the joint when you move it. Popping or creaking sounds when you move. Difficulty walking or exercising. An inability to grip items, twist your hand(s), or control the movements of your hands and fingers. How is this diagnosed? This condition may be diagnosed based on: Your medical history. A physical exam. Your symptoms. X-rays of the affected joint(s). Blood tests to rule out other types of arthritis. How is this treated? There is no cure for this condition, but treatment can help control pain and improve joint function.  Treatment may include a combination of therapies, such as: Pain relief techniques, such as: Applying heat and cold to the joint. Massage. A form of talk therapy called cognitive behavioral therapy (CBT). This therapy helps you set goals and follow up on the changes that you make. Medicines for pain and inflammation. The medicines can be taken by mouth or applied to the skin. They include: NSAIDs, such as ibuprofen. Prescription medicines. Strong anti-inflammatory medicines (corticosteroids). Certain nutritional supplements. A prescribed exercise program. You may work with a physical therapist. Assistive devices, such as a brace, wrap, splint, specialized glove, or cane. A weight control plan. Surgery, such as: An osteotomy. This is done to reposition the bones and relieve pain or to remove loose pieces of bone and cartilage. Joint replacement surgery. You may need this surgery if you have advanced osteoarthritis. Follow these instructions at home: Activity Rest your affected joints as told by your health care provider. Exercise as told by your health care provider. He or she may recommend specific types of exercise, such as: Strengthening exercises. These are done to strengthen the muscles that support joints affected by arthritis. Aerobic activities. These are exercises, such as brisk walking or water aerobics, that increase your heart rate. Range-of-motion activities. These help your joints move more easily. Balance and agility exercises. Managing pain, stiffness, and swelling   If directed, apply heat to the affected area as often as told by your health care provider. Use the heat source that your health care provider recommends, such as a moist heat pack or a heating pad. If you have a removable assistive device, remove it as told by   your health care provider. Place a towel between your skin and the heat source. If your health care provider tells you to keep the assistive device on  while you apply heat, place a towel between the assistive device and the heat source. Leave the heat on for 20-30 minutes. Remove the heat if your skin turns bright red. This is especially important if you are unable to feel pain, heat, or cold. You may have a greater risk of getting burned. If directed, put ice on the affected area. To do this: If you have a removable assistive device, remove it as told by your health care provider. Put ice in a plastic bag. Place a towel between your skin and the bag. If your health care provider tells you to keep the assistive device on during icing, place a towel between the assistive device and the bag. Leave the ice on for 20 minutes, 2-3 times a day. Move your fingers or toes often to reduce stiffness and swelling. Raise (elevate) the injured area above the level of your heart while you are sitting or lying down. General instructions Take over-the-counter and prescription medicines only as told by your health care provider. Maintain a healthy weight. Follow instructions from your health care provider for weight control. Do not use any products that contain nicotine or tobacco, such as cigarettes, e-cigarettes, and chewing tobacco. If you need help quitting, ask your health care provider. Use assistive devices as told by your health care provider. Keep all follow-up visits as told by your health care provider. This is important. Where to find more information National Institute of Arthritis and Musculoskeletal and Skin Diseases: www.niams.nih.gov National Institute on Aging: www.nia.nih.gov American College [employer] Rheumatology: www.rheumatology.org Contact a health care provider if: You have redness, swelling, or a feeling of warmth in a joint that gets worse. You have a fever along with joint or muscle aches. You develop a rash. You have trouble doing your normal activities. Get help right away if: You have pain that gets worse and is not relieved by  pain medicine. Summary Osteoarthritis is a type of arthritis that affects tissue covering the ends of bones in joints (cartilage). This condition is caused by the wearing down of cartilage that covers the ends of bones. The main symptom of this condition is pain, swelling, and stiffness in the joint. There is no cure for this condition, but treatment can help control pain and improve joint function. This information is not intended to replace advice given to you by your health care provider. Make sure you discuss any questions you have with your health care provider. Document Revised: 07/14/2019 Document Reviewed: 07/14/2019 [employer] Patient Education  2022 [employer]  

## 2021-07-19 NOTE — Assessment & Plan Note (Signed)
3 x infected under right breast, calm at the moment will refer to general surgery for possible excision

## 2021-07-27 DIAGNOSIS — H353221 Exudative age-related macular degeneration, left eye, with active choroidal neovascularization: Secondary | ICD-10-CM | POA: Diagnosis not present

## 2021-07-27 DIAGNOSIS — Z961 Presence of intraocular lens: Secondary | ICD-10-CM | POA: Diagnosis not present

## 2021-07-27 DIAGNOSIS — H43822 Vitreomacular adhesion, left eye: Secondary | ICD-10-CM | POA: Diagnosis not present

## 2021-07-27 DIAGNOSIS — H35342 Macular cyst, hole, or pseudohole, left eye: Secondary | ICD-10-CM | POA: Diagnosis not present

## 2021-07-27 DIAGNOSIS — H353212 Exudative age-related macular degeneration, right eye, with inactive choroidal neovascularization: Secondary | ICD-10-CM | POA: Diagnosis not present

## 2021-08-02 ENCOUNTER — Other Ambulatory Visit (INDEPENDENT_AMBULATORY_CARE_PROVIDER_SITE_OTHER): Payer: Medicare Other

## 2021-08-02 DIAGNOSIS — M25561 Pain in right knee: Secondary | ICD-10-CM

## 2021-08-02 DIAGNOSIS — M1990 Unspecified osteoarthritis, unspecified site: Secondary | ICD-10-CM

## 2021-08-02 DIAGNOSIS — M25562 Pain in left knee: Secondary | ICD-10-CM

## 2021-08-02 DIAGNOSIS — E079 Disorder of thyroid, unspecified: Secondary | ICD-10-CM | POA: Diagnosis not present

## 2021-08-03 ENCOUNTER — Other Ambulatory Visit: Payer: Self-pay | Admitting: Family Medicine

## 2021-08-03 DIAGNOSIS — R768 Other specified abnormal immunological findings in serum: Secondary | ICD-10-CM

## 2021-08-03 DIAGNOSIS — G8929 Other chronic pain: Secondary | ICD-10-CM

## 2021-08-03 DIAGNOSIS — M1990 Unspecified osteoarthritis, unspecified site: Secondary | ICD-10-CM

## 2021-08-03 LAB — TSH: TSH: 0.21 u[IU]/mL — ABNORMAL LOW (ref 0.35–5.50)

## 2021-08-03 LAB — RHEUMATOID FACTOR: Rheumatoid fact SerPl-aCnc: 32 IU/mL — ABNORMAL HIGH (ref <14)

## 2021-08-04 ENCOUNTER — Other Ambulatory Visit (INDEPENDENT_AMBULATORY_CARE_PROVIDER_SITE_OTHER): Payer: Medicare Other

## 2021-08-04 DIAGNOSIS — E079 Disorder of thyroid, unspecified: Secondary | ICD-10-CM

## 2021-08-04 LAB — T4, FREE: Free T4: 1.27 ng/dL (ref 0.60–1.60)

## 2021-08-31 DIAGNOSIS — H353221 Exudative age-related macular degeneration, left eye, with active choroidal neovascularization: Secondary | ICD-10-CM | POA: Diagnosis not present

## 2021-08-31 DIAGNOSIS — H43823 Vitreomacular adhesion, bilateral: Secondary | ICD-10-CM | POA: Diagnosis not present

## 2021-08-31 DIAGNOSIS — H353212 Exudative age-related macular degeneration, right eye, with inactive choroidal neovascularization: Secondary | ICD-10-CM | POA: Diagnosis not present

## 2021-08-31 DIAGNOSIS — H3554 Dystrophies primarily involving the retinal pigment epithelium: Secondary | ICD-10-CM | POA: Diagnosis not present

## 2021-08-31 DIAGNOSIS — H35342 Macular cyst, hole, or pseudohole, left eye: Secondary | ICD-10-CM | POA: Diagnosis not present

## 2021-08-31 DIAGNOSIS — H35363 Drusen (degenerative) of macula, bilateral: Secondary | ICD-10-CM | POA: Diagnosis not present

## 2021-08-31 DIAGNOSIS — Z961 Presence of intraocular lens: Secondary | ICD-10-CM | POA: Diagnosis not present

## 2021-09-07 ENCOUNTER — Ambulatory Visit: Payer: Medicare Other

## 2021-09-07 ENCOUNTER — Other Ambulatory Visit: Payer: Self-pay

## 2021-09-07 DIAGNOSIS — J449 Chronic obstructive pulmonary disease, unspecified: Secondary | ICD-10-CM

## 2021-09-07 DIAGNOSIS — G4733 Obstructive sleep apnea (adult) (pediatric): Secondary | ICD-10-CM | POA: Diagnosis not present

## 2021-09-09 DIAGNOSIS — G4733 Obstructive sleep apnea (adult) (pediatric): Secondary | ICD-10-CM | POA: Diagnosis not present

## 2021-09-19 DIAGNOSIS — N1832 Chronic kidney disease, stage 3b: Secondary | ICD-10-CM | POA: Diagnosis not present

## 2021-09-19 DIAGNOSIS — N189 Chronic kidney disease, unspecified: Secondary | ICD-10-CM | POA: Diagnosis not present

## 2021-09-22 ENCOUNTER — Telehealth: Payer: Self-pay | Admitting: Family Medicine

## 2021-09-22 NOTE — Telephone Encounter (Signed)
Medication: levothyroxine (SYNTHROID) 100 MCG tablet   clonazePAM (KLONOPIN) 1 MG tablet  Has the patient contacted their pharmacy? Yes.     Preferred Pharmacy: [facility] [address] [place], [place] [address] [phone]

## 2021-09-23 MED ORDER — LEVOTHYROXINE SODIUM 100 MCG PO TABS: 100.0000 ug | ORAL_TABLET | Freq: Every day | ORAL | 1 refills | Status: DC

## 2021-09-23 MED ORDER — CLONAZEPAM 1 MG PO TABS: 1.0000 mg | ORAL_TABLET | Freq: Three times a day (TID) | ORAL | 1 refills | Status: DC | PRN

## 2021-09-23 NOTE — Telephone Encounter (Signed)
Requesting: clonazepam Contract:  UDS: 12/30/19  Last Visit: 07/19/21 Next Visit: 10/06/21 Last Refill: 04/01/21  Rx sent already for levothyroxine.

## 2021-09-26 NOTE — Progress Notes (Signed)
Office Visit Note  Patient: Emily Mitchell             Date of Birth: May 26, 1946           MRN: [id number]             PCP: Mosie Lukes, MD Referring: Mosie Lukes, MD Visit Date: 09/27/2021   Subjective:  New Patient (Initial Visit) (Abnormal labs)   History of Present Illness: Emily Mitchell is a 76 y.o. female here for evaluation of positive rheumatoid factor with joint pain including bilateral knees. Medical history is extensive including stroke, CAD, COPD, CKD stage IV, macular degeneration She has bilateral knee arthroplasties for osteoarthritis. She has pain pain in multiple areas some of her biggest complain remains in knees despite previous joint replacement. She has some increased difficulty using her hands with stiffness and bony joint changes in multiple fingers but not much swelling no erythema or warmth. She does not suffer much numbness or weakness anywhere. She takes tylenol as needed and very rarely tramadol when pain is particularly severe. She denies any new skin rashes. No new shortness of breath no lymphadenopathy. She also has concern about a skin nodule on her low back she was referred to dermatology but the office scheduled was not in an easy location for her to have transportation.  07/2021 RF 32  Activities of Daily Living:  Patient reports morning stiffness for 5 minutes.   Patient Reports nocturnal pain.  Difficulty dressing/grooming: Denies Difficulty climbing stairs: Denies Difficulty getting out of chair: Denies Difficulty using hands for taps, buttons, cutlery, and/or writing: Reports  Review of Systems  Constitutional:  Positive for fatigue.  HENT:  Positive for mouth dryness.   Eyes:  Negative for dryness.  Respiratory:  Positive for shortness of breath.   Cardiovascular:  Positive for swelling in legs/feet.  Gastrointestinal:  Positive for constipation.  Endocrine: Positive for excessive thirst and increased urination.  Genitourinary:   Negative for difficulty urinating.  Musculoskeletal:  Positive for joint pain, gait problem, joint pain, joint swelling, muscle weakness, morning stiffness and muscle tenderness.  Skin:  Negative for rash.  Allergic/Immunologic: Positive for susceptible to infections.  Neurological:  Negative for numbness.  Hematological:  Positive for bruising/bleeding tendency.  Psychiatric/Behavioral:  Positive for sleep disturbance.    PMFS History:  Patient Active Problem List   Diagnosis Date Noted   Rheumatoid factor positive 09/27/2021   Recurrent infection of skin 07/19/2021   Skin lesion of back 03/27/2021   Asthma    Carotid artery occlusion    Chicken pox    Hepatitis    Hypertension    Mumps    Sleep apnea    Stroke [facility])    CHF (congestive heart failure) (HCC)    Statin intolerance 09/29/2019   Breast mass, left 04/28/2019   Sensation of foreign body in larynx 04/08/2019   Hypercalcemia 03/09/2019   Hoarseness 03/09/2019   Back pain 02/02/2019   Vitreomacular adhesion of both eyes 01/07/2019   History of food allergy 08/14/2018   CAD in native artery 07/19/2018   Atherosclerotic heart disease of native coronary artery without angina pectoris 07/19/2018   Chest pain 07/18/2018   History of pulmonary embolism 07/17/2018   Renal insufficiency 07/17/2018   H/O multiple allergies 07/14/2018   Chronic rhinitis 06/14/2018   Vitamin D deficiency 03/03/2018   Candidal skin infection 02/28/2018   COPD with asthma ([place]) 11/06/2017   Lymphadenopathy 10/18/2017   Right shoulder pain 10/18/2017  Neck pain 04/22/2017   Hyperglycemia 03/14/2016   Preventative health care 03/14/2016   Macular degeneration of both eyes 12/16/2015   Arthritis 11/30/2015   Congestive heart failure ([place]) 11/30/2015   Chronic kidney disease 11/30/2015   Essential hypertension 10/18/2015   Low back pain 09/19/2015   Exudative age-related macular degeneration of right eye with active choroidal  neovascularization ([place]) 08/26/2015   Carotid artery disease ([place]) 06/16/2015   Left hip pain 05/23/2015   Atypical chest pain 05/23/2015   Medicare annual wellness visit, subsequent 02/21/2015   Anemia 07/05/2014   Essential tremor 11/05/2013   Static tremor 11/05/2013   Lower urinary tract infectious disease 10/19/2013   H. pylori infection 82/95/6213   Helicobacter pylori (H. pylori) as the cause of diseases classified elsewhere 10/19/2013   Hyperlipidemia, mixed 05/26/2013   Pulmonary emboli (HCC) 05/26/2013   Acid reflux    CKD (chronic kidney disease), stage IV (HCC)    Thyroid disease    Occlusion and stenosis of carotid artery without mention of cerebral infarction 12/17/2012   Knee pain, bilateral 12/17/2012   Numbness of finger 12/17/2012   Constipation 12/17/2008   Anxiety and depression 12/14/2008   OSA (obstructive sleep apnea) 12/14/2008   Chronic obstructive pulmonary disease ([place]) 12/14/2008   Mixed anxiety depressive disorder 12/14/2008   Gastro-esophageal reflux disease without esophagitis 12/14/2008    Past Medical History:  Diagnosis Date   Acid reflux    Anemia 07/05/2014   Anxiety and depression 12/14/2008   Qualifier: Diagnosis of  By: [name] LPN, Almyra Free     Arthritis    "fingers, toes, knees, joints" (07/18/2018)   Asthma    Atherosclerotic heart disease of native coronary artery without angina pectoris 07/19/2018   Atypical chest pain 05/23/2015   Back pain 02/02/2019   Breast mass, left 04/28/2019   CAD in native artery 07/19/2018   Candidal skin infection 02/28/2018   Carotid artery disease ([place]) 06/16/2015   S/p CEA    Carotid artery occlusion    a. R carotid occluded, 08-65% LICA.   Chest pain 07/18/2018   CHF (congestive heart failure) (HCC)    Chicken pox as a child   Chronic kidney disease    Bright's Disease at age 64    Chronic obstructive pulmonary disease ([place]) 12/14/2008   Formatting of this note might be different from the  original. Overview:  Spirometry 05/12/2013: FEV1 72% predicted FEV1 FVC ratio 73 predicted FEF 25 75  43% predicted  Last Assessment & Plan:  OK to stop taking advair Use albuterol as needed only Check oxygen levels during sleep - we will call you with results Call if symptoms worse   Chronic rhinitis 06/14/2018   CKD (chronic kidney disease), stage IV ([place])    Follows with [facility], Dr Alonza Smoker    Congestive heart failure ([place]) 11/30/2015   Formatting of this note might be different from the original. Last Assessment & Plan:  No recent exacerbation   Constipation 12/17/2008   Qualifier: Diagnosis of  By: Westly Pam    COPD with asthma ([place]) 11/06/2017   Essential hypertension 10/18/2015   Formatting of this note might be different from the original. Last Assessment & Plan:  poorlycontrolled and under a great deal of stress, add Metoprolol XL 25 mg daily. Encouraged heart healthy diet such as the DASH diet and exercise as tolerated.   Essential tremor 11/05/2013   Exudative age-related macular degeneration of right eye with active choroidal neovascularization ([place]) 08/26/2015  Gastro-esophageal reflux disease without esophagitis 12/14/2008   Formatting of this note might be different from the original. Formatting of this note might be different from the original. Formatting of this note might be different from the original. Overview:  Qualifier: Diagnosis of  By: [name] LPN, Almyra Free   Last Assessment & Plan:  Avoid offending foods, start probiotics. Do not eat large meals in late evening and consider raising head of bed. Last Assessment   GERD (gastroesophageal reflux disease)    H. pylori infection 10/19/2013   H/O multiple allergies 34/19/6222   Helicobacter pylori (H. pylori) as the cause of diseases classified elsewhere 10/19/2013   Formatting of this note might be different from the original. Last Assessment & Plan:  Tetracycline, Pepto Bismol, Flagyl and Omeprazole.    Hepatitis 1970s   "don't know for sure which one it was; think it was A" (07/18/2018)   History of food allergy 08/14/2018   History of pulmonary embolism 07/17/2018   Hoarseness 03/09/2019   Hypercalcemia 03/09/2019   Hyperglycemia 03/14/2016   Hyperlipidemia, mixed 05/26/2013   Crestor caused N/V Did not tolerate Lipitor    Hypertension    Hypothyroidism    Knee pain, bilateral 12/17/2012   Left hip pain 05/23/2015   Low back pain 09/19/2015   Lower urinary tract infectious disease 10/19/2013   Lymphadenopathy 10/18/2017   Macular degeneration of both eyes 12/16/2015   Right eye is wet Left Eye is dry Shots every 11 to 12 weeks in right eye at opthamologist office   Medicare annual wellness visit, subsequent 02/21/2015   Mixed anxiety depressive disorder 12/14/2008   Formatting of this note might be different from the original. Overview:  Qualifier: Diagnosis of  By: [name] LPN, Almyra Free  Patient had increased  Tremors on Bupropion  Last Assessment & Plan:  Is struggling with the stress of her 41 year old granddaughter whom she raised moving back with her own mother, the patient is estranged from her daughter so she is very upset. Increase Citalopram 40 mg daily    Mumps 76 yrs old   Neck pain 04/22/2017   Numbness of finger 12/17/2012   Occlusion and stenosis of carotid artery without mention of cerebral infarction 12/17/2012   OSA (obstructive sleep apnea) 12/14/2008   PSG  06/2013-AHI 6/h 08/2011 ([place]) AHI 4/h No longer needs CPAP    Preventative health care 03/14/2016   Pulmonary emboli ([place]) 05/26/2013   Renal insufficiency 07/17/2018   Right shoulder pain 10/18/2017   Sensation of foreign body in larynx 04/08/2019   Skin lesion of back 03/27/2021   Sleep apnea    "don't use CPAP anymore; dr said I didn't have to" (07/18/2018)   Static tremor 11/05/2013   Formatting of this note might be different from the original. Last Assessment & Plan:  Follows with [facility]    Statin intolerance 09/29/2019   Stroke [facility])    "been told I've had some strokes; didn't know I'd had them" (07/18/2018)   Thyroid disease    Tremor    Vitamin D deficiency 03/03/2018   Vitreomacular adhesion of both eyes 01/07/2019   Formatting of this note might be different from the original. no traction    Family History  Problem Relation Age of Onset   Stroke Mother        mini stroke   Kidney disease Mother    Heart failure Mother    Hypertension Mother    Diabetes Sister  type 2   Kidney disease Sister    Allergic rhinitis Sister    Osteoarthritis Sister    Hyperlipidemia Sister    Heart attack Maternal Grandfather    Colon cancer Neg Hx    Past Surgical History:  Procedure Laterality Date   APPENDECTOMY  1984   CARDIAC CATHETERIZATION  ~ 2006   CAROTID ANGIOGRAPHY Left 08/20/2018   Procedure: CAROTID ANGIOGRAPHY;  Surgeon: Serafina Mitchell, MD;  Location: [place] [facility];  Service: Cardiovascular;  Laterality: Left;   CAROTID ENDARTERECTOMY Right 05/10/07   cea   CATARACT EXTRACTION Left    CATARACT EXTRACTION W/ INTRAOCULAR LENS  IMPLANT, BILATERAL Bilateral    CORONARY ANGIOPLASTY WITH STENT PLACEMENT  07/18/2018   CORONARY STENT INTERVENTION N/A 07/18/2018   Procedure: CORONARY STENT INTERVENTION;  Surgeon: Lorretta Harp, MD;  Location: [place] [facility];  Service: Cardiovascular;  Laterality: N/A;   INTRAVASCULAR PRESSURE WIRE/FFR STUDY N/A 07/18/2018   Procedure: INTRAVASCULAR PRESSURE WIRE/FFR STUDY;  Surgeon: Lorretta Harp, MD;  Location: [place] [facility];  Service: Cardiovascular;  Laterality: N/A;   JOINT REPLACEMENT     LEFT HEART CATH AND CORONARY ANGIOGRAPHY N/A 07/18/2018   Procedure: LEFT HEART CATH AND CORONARY ANGIOGRAPHY;  Surgeon: Lorretta Harp, MD;  Location: [place] [facility];  Service: Cardiovascular;  Laterality: N/A;   MULTIPLE TOOTH EXTRACTIONS     TOTAL KNEE ARTHROPLASTY Bilateral 2005 - 2011   right - left    VAGINAL HYSTERECTOMY  1984   WISDOM TOOTH EXTRACTION     Social History   Social History Narrative   Right handed   No caffeine   One story home   Immunization History  Administered Date(s) Administered   Fluad Quad(high Dose 65+) 04/17/2019, 04/14/2020   Influenza Split 04/29/2013   Influenza, High Dose Seasonal PF 03/30/2016, 04/19/2017, 05/02/2018   Influenza,inj,Quad PF,6+ Mos 03/26/2014, 05/17/2015   Influenza-Unspecified 06/14/2021   PFIZER(Purple Top)SARS-COV-2 Vaccination 10/19/2019, 11/10/2019, 05/08/2020, 10/30/2020   Pfizer Covid-19 Vaccine Bivalent Booster 68yr & up 05/18/2021   Pneumococcal Conjugate-13 04/29/2013   Pneumococcal Polysaccharide-23 04/30/2014, 04/17/2019   Tdap 05/26/2013   Zoster Recombinat (Shingrix) 04/27/2017, 06/13/2017   Zoster, Live 07/31/2012     Objective: Vital Signs: BP (!) 175/70 (BP Location: Right Arm, Patient Position: Sitting, Cuff Size: Normal)    Pulse 69    Resp 17    Ht _0  (1.6 m)    Wt 181 lb (82.1 kg)    BMI 32.06 kg/m    Physical Exam Eyes:     Conjunctiva/sclera: Conjunctivae normal.  Cardiovascular:     Rate and Rhythm: Normal rate and regular rhythm.  Pulmonary:     Effort: Pulmonary effort is normal.     Breath sounds: Normal breath sounds.  Lymphadenopathy:     Cervical: No cervical adenopathy.  Skin:    General: Skin is warm and dry.     Comments: Low back large superficial cystic appearing nodule left of midline, no surrounding induration or erythema  Neurological:     Deep Tendon Reflexes: Reflexes normal.     Comments: Positional tremor  Psychiatric:        Mood and Affect: Mood normal.     Musculoskeletal Exam:  Neck full ROM no tenderness Shoulders slightly reduced extension ROM some tenderness posteriorly in left Elbows full ROM tenderness to pressure at medial epicondyles without swelling Wrists full ROM no tenderness or swelling Fingers full ROM tenderness at 3rd MCP joint, chornic appearing  joint enlargement without palpable synovitis Knees full ROM left knee medial joint line tenderness to pressure right knee medial and lateral tenderness, valgus deformity present, no effusions present Ankles full ROM no tenderness or swelling   Investigation: No additional findings.  Imaging: XR Hand 2 View Left  Result Date: 09/27/2021 X-ray left hand 2 views Radiocarpal joint space appears normal.  Very small calcification in the ulnocarpal space.  Mild to moderate first CMC joint degenerative changes other carpal bones appear normal.  MCP joints appear normal.  There are mild asymmetric joint space narrowing in PIPs and DIPs most in the second and third digit.  No erosions are seen bone mineralization suggest generalized osteopenia. Impression Mild to moderate osteoarthritis involving first CMC joint and distal joints predominantly 2nd and 3rd fingers  XR Hand 2 View Right  Result Date: 09/27/2021 X-ray right hand 2 views Radiocarpal joint space appears normal.  Mild degenerative changes of first [facility] joint few cystic changes present in other carpal bones.  Small enthesophyte at first MCP other MCP joint spaces appear normal.  There is mild asymmetric joint space narrowing in the PIP and DIP joints throughout with few early marginal osteophytes.  There are no erosions bone mineralization suggest generalized osteopenia. Impression Mild to moderate osteoarthritis changes throughout the hand in a typical pattern   Recent Labs: Lab Results  Component Value Date   WBC 7.6 03/24/2021   HGB 12.6 03/24/2021   PLT 295.0 03/24/2021   NA 144 06/28/2021   K 3.9 06/28/2021   CL 108 06/28/2021   CO2 27 06/28/2021   GLUCOSE 79 06/28/2021   BUN 27 (H) 06/28/2021   CREATININE 1.51 (H) 06/28/2021   BILITOT 0.4 06/28/2021   ALKPHOS 41 06/28/2021   AST 21 06/28/2021   ALT 16 06/28/2021   PROT 6.2 06/28/2021   ALBUMIN 4.0 06/28/2021   CALCIUM 9.6 06/28/2021   GFRAA 32 (L) 03/15/2019    Speciality  Comments: No specialty comments available.  Procedures:  No procedures performed Allergies: Niaspan [niacin er], Pantoprazole, Sulfonamide derivatives, Bupropion, Nitroglycerin, Penicillins, Statins, Apple juice, and Banana   Assessment / Plan:     Visit Diagnoses: Arthritis  Rheumatoid factor positive - Plan: XR Hand 2 View Right, XR Hand 2 View Left, Sedimentation rate, Cyclic citrul peptide antibody, IgG  Joint changes appear most consistent for chronic osteoarthritis changes although there is some appreciable enlargement of 3rd MCP joint. Xray of bilateral hands without any concerning changes for RA. There is possible TFCC chondrocalcinosis could be consistent with CPPD although no inflammation on our exam. Checking CCP nd sed rate today.  Lymphadenopathy  Mild cervical lymph node swelling nothing concerning on exam today.  CKD (chronic kidney disease), stage IV ([place])  Not able to tolerated NSAIDs due to significant renal impairment. Labs slightly better on most recent tests eGFR in 30s.  Skin lesion of back  Cystic appearing lesion on her low back about 1 cm protruding at this time. She requests to see dermatology closer to [place] or with [facility].  Orders: Orders Placed This Encounter  Procedures   XR Hand 2 View Right   XR Hand 2 View Left   Sedimentation rate   Cyclic citrul peptide antibody, IgG   No orders of the defined types were placed in this encounter.   Follow-Up Instructions: No follow-ups on file.   Collier Salina, MD  Note - This record has been created using [employer].  Chart creation errors have been  Chart creation errors have been sought, but may not always  have been located. Such creation errors do not reflect on  the standard of medical care.

## 2021-09-27 ENCOUNTER — Ambulatory Visit: Payer: Self-pay

## 2021-09-27 ENCOUNTER — Ambulatory Visit: Payer: Medicare Other | Admitting: Internal Medicine

## 2021-09-27 ENCOUNTER — Encounter: Payer: Self-pay | Admitting: Internal Medicine

## 2021-09-27 ENCOUNTER — Other Ambulatory Visit: Payer: Self-pay

## 2021-09-27 VITALS — BP 175/70 | HR 69 | Resp 17 | Ht 63.0 in | Wt 181.0 lb

## 2021-09-27 DIAGNOSIS — L989 Disorder of the skin and subcutaneous tissue, unspecified: Secondary | ICD-10-CM

## 2021-09-27 DIAGNOSIS — N184 Chronic kidney disease, stage 4 (severe): Secondary | ICD-10-CM

## 2021-09-27 DIAGNOSIS — R768 Other specified abnormal immunological findings in serum: Secondary | ICD-10-CM

## 2021-09-27 DIAGNOSIS — R591 Generalized enlarged lymph nodes: Secondary | ICD-10-CM

## 2021-09-27 DIAGNOSIS — M79642 Pain in left hand: Secondary | ICD-10-CM

## 2021-09-27 DIAGNOSIS — M79641 Pain in right hand: Secondary | ICD-10-CM | POA: Diagnosis not present

## 2021-09-27 DIAGNOSIS — M1990 Unspecified osteoarthritis, unspecified site: Secondary | ICD-10-CM | POA: Diagnosis not present

## 2021-09-27 HISTORY — DX: Other specified abnormal immunological findings in serum: R76.8

## 2021-09-28 ENCOUNTER — Other Ambulatory Visit: Payer: Self-pay | Admitting: *Deleted

## 2021-09-28 DIAGNOSIS — L989 Disorder of the skin and subcutaneous tissue, unspecified: Secondary | ICD-10-CM

## 2021-09-28 LAB — SEDIMENTATION RATE: Sed Rate: 2 mm/h (ref 0–30)

## 2021-09-28 LAB — CYCLIC CITRUL PEPTIDE ANTIBODY, IGG: Cyclic Citrullin Peptide Ab: <16 UNITS

## 2021-09-28 NOTE — Progress Notes (Signed)
Lab results look fine no evidence for current inflammation. Her xrays show osteoarthritis no evidence of RA. We don't need a scheduled follow up for any new treatment.

## 2021-10-03 ENCOUNTER — Ambulatory Visit: Payer: Medicare Other | Admitting: Dermatology

## 2021-10-06 ENCOUNTER — Encounter: Payer: Self-pay | Admitting: Family Medicine

## 2021-10-06 ENCOUNTER — Ambulatory Visit (INDEPENDENT_AMBULATORY_CARE_PROVIDER_SITE_OTHER): Payer: Medicare Other | Admitting: Family Medicine

## 2021-10-06 ENCOUNTER — Encounter: Payer: Medicare Other | Admitting: Family Medicine

## 2021-10-06 VITALS — BP 133/73 | HR 79 | Ht 63.0 in | Wt 179.6 lb

## 2021-10-06 DIAGNOSIS — Z0000 Encounter for general adult medical examination without abnormal findings: Secondary | ICD-10-CM

## 2021-10-06 DIAGNOSIS — R195 Other fecal abnormalities: Secondary | ICD-10-CM

## 2021-10-06 DIAGNOSIS — I10 Essential (primary) hypertension: Secondary | ICD-10-CM | POA: Diagnosis not present

## 2021-10-06 DIAGNOSIS — Z1211 Encounter for screening for malignant neoplasm of colon: Secondary | ICD-10-CM | POA: Diagnosis not present

## 2021-10-06 NOTE — Progress Notes (Signed)
BP 133/73    Pulse 79    Ht '5\' 3"'$  (1.6 m)    Wt 179 lb 9.6 oz (81.5 kg)    BMI 31.81 kg/m    Subjective:    Patient ID: Emily Mitchell, female    DOB: [birth year], 76 y.o.   MRN: [id number]  HPI: Emily Mitchell is a 76 y.o. female presenting on 10/06/2021 for comprehensive medical examination. Current medical complaints include: none  Taking all medicines as prescribed, no complaints or concerns. She denies any chest pain, dyspnea, headaches, vision changes, edema.   She currently lives with: son  Interim Problems from her last visit: no   She reports regular vision exams q1-5y: yes She reports regular dental exams q 52m no Her diet consists of: low sodium, low cholesterol/sugar  She endorses exercise and/or activity of: stationary bike 2 miles a day  She works at: retired  She denies ETOH use. She denies nictoine use. She denies illegal substance use.    She reports post-menopause Current menopausal symptoms: no   She denies concerns about skin changes today. She denies concerns about bowel changes today. She denies concerns about bladder changes today.  Depression Screen done today and results listed below:  [facility] 10/06/2021 03/24/2021 03/24/2021 01/02/2020 05/22/2019  Decreased Interest 0 0 0 0 0  Down, Depressed, Hopeless 0 0 0 0 0  PHQ - 2 Score 0 0 0 0 0  Some recent data might be hidden    She has a history of falls. I did complete a risk assessment for falls. A plan of care for falls was documented.       Past Medical History:  Past Medical History:  Diagnosis Date   Acid reflux    Anemia 07/05/2014   Anxiety and depression 12/14/2008   Qualifier: Diagnosis of  By: LKellie SimmeringLPN, JAlmyra Free    Arthritis    "fingers, toes, knees, joints" (07/18/2018)   Asthma    Atherosclerotic heart disease of native coronary artery without angina pectoris 07/19/2018   Atypical chest pain 05/23/2015   Back pain 02/02/2019   Breast mass, left 04/28/2019    CAD in native artery 07/19/2018   Candidal skin infection 02/28/2018   Carotid artery disease ([facility] 06/16/2015   S/p CEA    Carotid artery occlusion    a. R carotid occluded, 754-27%LICA.   Chest pain 07/18/2018   CHF (congestive heart failure) (HCC)    Chicken pox as a child   Chronic kidney disease    Bright's Disease at age 76   Chronic obstructive pulmonary disease ([facility] 12/14/2008   Formatting of this note might be different from the original. Overview:  Spirometry 05/12/2013: FEV1 72% predicted FEV1 FVC ratio 73 predicted FEF 25 75  43% predicted  Last Assessment & Plan:  OK to stop taking advair Use albuterol as needed only Check oxygen levels during sleep - we will call you with results Call if symptoms worse   Chronic rhinitis 06/14/2018   CKD (chronic kidney disease), stage IV ([place]    Follows with [facility], Dr JAlonza Smoker   Congestive heart failure ([facility] 11/30/2015   Formatting of this note might be different from the original. Last Assessment & Plan:  No recent exacerbation   Constipation 12/17/2008   Qualifier: Diagnosis of  By: LWestly Pam   COPD with asthma ([facility] 11/06/2017   Essential hypertension 10/18/2015   Formatting of this note might  be different from the original. Last Assessment & Plan:  poorlycontrolled and under a great deal of stress, add Metoprolol XL 25 mg daily. Encouraged heart healthy diet such as the DASH diet and exercise as tolerated.   Essential tremor 11/05/2013   Exudative age-related macular degeneration of right eye with active choroidal neovascularization ([place]) 08/26/2015   Gastro-esophageal reflux disease without esophagitis 12/14/2008   Formatting of this note might be different from the original. Formatting of this note might be different from the original. Formatting of this note might be different from the original. Overview:  Qualifier: Diagnosis of  By: [name] LPN, Almyra Free   Last Assessment & Plan:  Avoid offending foods,  start probiotics. Do not eat large meals in late evening and consider raising head of bed. Last Assessment   GERD (gastroesophageal reflux disease)    H. pylori infection 10/19/2013   H/O multiple allergies 22/08/5425   Helicobacter pylori (H. pylori) as the cause of diseases classified elsewhere 10/19/2013   Formatting of this note might be different from the original. Last Assessment & Plan:  Tetracycline, Pepto Bismol, Flagyl and Omeprazole.   Hepatitis 1970s   "don't know for sure which one it was; think it was A" (07/18/2018)   History of food allergy 08/14/2018   History of pulmonary embolism 07/17/2018   Hoarseness 03/09/2019   Hypercalcemia 03/09/2019   Hyperglycemia 03/14/2016   Hyperlipidemia, mixed 05/26/2013   Crestor caused N/V Did not tolerate Lipitor    Hypertension    Hypothyroidism    Knee pain, bilateral 12/17/2012   Left hip pain 05/23/2015   Low back pain 09/19/2015   Lower urinary tract infectious disease 10/19/2013   Lymphadenopathy 10/18/2017   Macular degeneration of both eyes 12/16/2015   Right eye is wet Left Eye is dry Shots every 11 to 12 weeks in right eye at opthamologist office   Medicare annual wellness visit, subsequent 02/21/2015   Mixed anxiety depressive disorder 12/14/2008   Formatting of this note might be different from the original. Overview:  Qualifier: Diagnosis of  By: [name] LPN, Almyra Free  Patient had increased  Tremors on Bupropion  Last Assessment & Plan:  Is struggling with the stress of her 38 year old granddaughter whom she raised moving back with her own mother, the patient is estranged from her daughter so she is very upset. Increase Citalopram 40 mg daily    Mumps 76 yrs old   Neck pain 04/22/2017   Numbness of finger 12/17/2012   Occlusion and stenosis of carotid artery without mention of cerebral infarction 12/17/2012   OSA (obstructive sleep apnea) 12/14/2008   PSG  06/2013-AHI 6/h 08/2011 ([facility]) AHI 4/h No longer needs CPAP     Preventative health care 03/14/2016   Pulmonary emboli ([place]) 05/26/2013   Renal insufficiency 07/17/2018   Right shoulder pain 10/18/2017   Sensation of foreign body in larynx 04/08/2019   Skin lesion of back 03/27/2021   Sleep apnea    "don't use CPAP anymore; dr said I didn't have to" (07/18/2018)   Static tremor 11/05/2013   Formatting of this note might be different from the original. Last Assessment & Plan:  Follows with [facility]   Statin intolerance 09/29/2019   Stroke [facility])    "been told I've had some strokes; didn't know I'd had them" (07/18/2018)   Thyroid disease    Tremor    Vitamin D deficiency 03/03/2018   Vitreomacular adhesion of both eyes 01/07/2019   Formatting of this note might  be different from the original. no traction    Surgical History:  Past Surgical History:  Procedure Laterality Date   APPENDECTOMY  1984   CARDIAC CATHETERIZATION  ~ 2006   CAROTID ANGIOGRAPHY Left 08/20/2018   Procedure: CAROTID ANGIOGRAPHY;  Surgeon: Serafina Mitchell, MD;  Location: [place] [facility];  Service: Cardiovascular;  Laterality: Left;   CAROTID ENDARTERECTOMY Right 05/10/07   cea   CATARACT EXTRACTION Left    CATARACT EXTRACTION W/ INTRAOCULAR LENS  IMPLANT, BILATERAL Bilateral    CORONARY ANGIOPLASTY WITH STENT PLACEMENT  07/18/2018   CORONARY STENT INTERVENTION N/A 07/18/2018   Procedure: CORONARY STENT INTERVENTION;  Surgeon: Lorretta Harp, MD;  Location: [place] [facility];  Service: Cardiovascular;  Laterality: N/A;   INTRAVASCULAR PRESSURE WIRE/FFR STUDY N/A 07/18/2018   Procedure: INTRAVASCULAR PRESSURE WIRE/FFR STUDY;  Surgeon: Lorretta Harp, MD;  Location: [place] [facility];  Service: Cardiovascular;  Laterality: N/A;   JOINT REPLACEMENT     LEFT HEART CATH AND CORONARY ANGIOGRAPHY N/A 07/18/2018   Procedure: LEFT HEART CATH AND CORONARY ANGIOGRAPHY;  Surgeon: Lorretta Harp, MD;  Location: [place] [facility];  Service: Cardiovascular;  Laterality:  N/A;   MULTIPLE TOOTH EXTRACTIONS     TOTAL KNEE ARTHROPLASTY Bilateral 2005 - 2011   right - left   VAGINAL HYSTERECTOMY  1984   WISDOM TOOTH EXTRACTION      Medications:  Current Outpatient Medications on File Prior to Visit  Medication Sig   acetaminophen (TYLENOL) 500 MG tablet Take 500 mg by mouth as needed for headache.   albuterol (VENTOLIN HFA) 108 (90 Base) MCG/ACT inhaler Inhale 2 puffs into the lungs every 6 (six) hours as needed for wheezing or shortness of breath.   aspirin EC 81 MG tablet Take 1 tablet (81 mg total) by mouth daily.   atorvastatin (LIPITOR) 10 MG tablet TAKE ONE TABLET BY MOUTH DAILY   BREZTRI AEROSPHERE 160-9-4.8 MCG/ACT AERO INHALE TWO PUFFS BY MOUTH TWICE A DAY IN THE MORNING AND IN THE EVENING   Calcium Citrate-Vitamin D (CALCIUM + D PO) Take 1 tablet by mouth daily.   Calcium Citrate-Vitamin D 200-250 MG-UNIT TABS Take 1 tablet by mouth daily.   Cholecalciferol 125 MCG (5000 UT) capsule Take by mouth.   Cholecalciferol 25 MCG (1000 UT) tablet Take 1,000 Units by mouth daily.   Choline Fenofibrate (FENOFIBRIC ACID) 135 MG CPDR TAKE ONE CAPSULE BY MOUTH DAILY   clonazePAM (KLONOPIN) 1 MG tablet Take 1 tablet (1 mg total) by mouth 3 (three) times daily as needed. for anxiety   clopidogrel (PLAVIX) 75 MG tablet TAKE ONE TABLET BY MOUTH DAILY   cyanocobalamin 100 MCG tablet Take 500 mcg by mouth daily.   esomeprazole (NEXIUM) 40 MG capsule Take by mouth.   ezetimibe (ZETIA) 10 MG tablet TAKE ONE TABLET BY MOUTH DAILY   famotidine (PEPCID) 40 MG tablet TAKE ONE TABLET BY MOUTH DAILY   fenofibrate micronized (LOFIBRA) 134 MG capsule Take by mouth.   FLUoxetine (PROZAC) 20 MG tablet TAKE ONE TABLET BY MOUTH DAILY   gabapentin (NEURONTIN) 100 MG capsule TAKE TWO CAPSULES BY MOUTH EVERY NIGHT AT BEDTIME   levothyroxine (SYNTHROID) 100 MCG tablet Take 1 tablet (100 mcg total) by mouth daily.   montelukast (SINGULAIR) 10 MG tablet Take 10 mg by mouth at bedtime  as needed for allergies.   Multiple Vitamins-Minerals (PRESERVISION AREDS 2 PO) Take 1 capsule by mouth daily.   nitroGLYCERIN (NITROSTAT) 0.4 MG SL tablet  Place under the tongue.   NON FORMULARY Take 1 tablet by mouth daily. Digestive advantage.   Omega-3 Fatty Acids (FISH OIL PO) Take 1 capsule by mouth daily.   Probiotic Product (VSL#3) CAPS Take 1 capsule by mouth daily.   proparacaine (ALCAINE) 0.5 % ophthalmic solution    traMADol (ULTRAM) 50 MG tablet Take 1 tablet (50 mg total) by mouth every 8 (eight) hours as needed for moderate pain or severe pain.   No current facility-administered medications on file prior to visit.    Allergies:  Allergies  Allergen Reactions   Niaspan [Niacin Er] Nausea And Vomiting and Swelling    Swelling in mouth   Pantoprazole     Mouth sores   Sulfonamide Derivatives Hives   Bupropion Other (See Comments)    Uncontrollable shakes   Nitroglycerin     NOT allergic - cardiology has recommended she NOT use this due to h/o severe carotid disease as it may decrease cerebral perfusion   Penicillins Hives    DID THE REACTION INVOLVE: Swelling of the face/tongue/throat, SOB, or low BP? Yes Sudden or severe rash/hives, skin peeling, or the inside of the mouth or nose? Unknown Did it require medical treatment? Unknown When did it last happen?   [age]  If all above answers are NO, may proceed with cephalosporin use.   Statins    Apple Juice Rash   Banana Rash    Social History:  Social History   Socioeconomic History   Marital status: Divorced    Spouse name: Not on file   Number of children: 2   Years of education: Not on file   Highest education level: Not on file  Occupational History   Not on file  Tobacco Use   Smoking status: Former    Packs/day: 2.00    Years: 30.00    Pack years: 60.00    Types: Cigarettes    Quit date: 12/03/1994    Years since quitting: 26.8   Smokeless tobacco: Never  Vaping Use   Vaping Use: Never used   Substance and Sexual Activity   Alcohol use: Yes    Comment: special occasions   Drug use: Never   Sexual activity: Not Currently    Comment: lives with son and friend, no dietary restrictions  Other Topics Concern   Not on file  Social History Narrative   Right handed   No caffeine   One story home   Social Determinants of Health   Financial Resource Strain: Low Risk    Difficulty of Paying Living Expenses: Not hard at all  Food Insecurity: No Food Insecurity   Worried About Charity fundraiser in the Last Year: Never true   [place] in the Last Year: Never true  Transportation Needs: No Transportation Needs   Lack of Transportation (Medical): No   Lack of Transportation (Non-Medical): No  Physical Activity: Insufficiently Active   Days of Exercise per Week: 7 days   Minutes of Exercise per Session: 20 min  Stress: No Stress Concern Present   Feeling of Stress : Not at all  Social Connections: Socially Isolated   Frequency of Communication with Friends and Family: More than three times a week   Frequency of Social Gatherings with Friends and Family: More than three times a week   Attends Religious Services: Never   Marine scientist or Organizations: No   Attends Archivist Meetings: Never   Marital Status: Divorced  Intimate  Partner Violence: Not At Risk   Fear of Current or Ex-Partner: No   Emotionally Abused: No   Physically Abused: No   Sexually Abused: No   Social History   Tobacco Use  Smoking Status Former   Packs/day: 2.00   Years: 30.00   Pack years: 60.00   Types: Cigarettes   Quit date: 12/03/1994   Years since quitting: 26.8  Smokeless Tobacco Never   Social History   Substance and Sexual Activity  Alcohol Use Yes   Comment: special occasions    Family History:  Family History  Problem Relation Age of Onset   Stroke Mother        mini stroke   Kidney disease Mother    Heart failure Mother    Hypertension Mother     Diabetes Sister        type 2   Kidney disease Sister    Allergic rhinitis Sister    Osteoarthritis Sister    Hyperlipidemia Sister    Heart attack Maternal Grandfather    Colon cancer Neg Hx     Past medical history, surgical history, medications, allergies, family history and social history reviewed with patient today and changes made to appropriate areas of the chart.   All ROS negative except what is listed above and in the HPI.      Objective:    BP 133/73    Pulse 79    Ht '5\' 3"'$  (1.6 m)    Wt 179 lb 9.6 oz (81.5 kg)    BMI 31.81 kg/m   Wt Readings from Last 3 Encounters:  10/06/21 179 lb 9.6 oz (81.5 kg)  09/27/21 181 lb (82.1 kg)  07/19/21 180 lb (81.6 kg)    Physical Exam Vitals reviewed.  Constitutional:      Appearance: Normal appearance. She is obese.  HENT:     Head: Normocephalic and atraumatic.     Right Ear: Tympanic membrane normal.     Left Ear: Tympanic membrane normal.     Nose: Nose normal.     Mouth/Throat:     Mouth: Mucous membranes are moist.     Pharynx: Oropharynx is clear.  Eyes:     Extraocular Movements: Extraocular movements intact.     Conjunctiva/sclera: Conjunctivae normal.     Pupils: Pupils are equal, round, and reactive to light.  Cardiovascular:     Rate and Rhythm: Normal rate and regular rhythm.     Pulses: Normal pulses.     Heart sounds: Normal heart sounds.  Pulmonary:     Effort: Pulmonary effort is normal.     Breath sounds: Normal breath sounds.  Abdominal:     General: Abdomen is flat. Bowel sounds are normal. There is no distension.     Palpations: Abdomen is soft. There is no mass.     Tenderness: There is no abdominal tenderness. There is no guarding or rebound.  Musculoskeletal:        General: Normal range of motion.     Cervical back: Normal range of motion and neck supple.  Skin:    General: Skin is warm and dry.     Capillary Refill: Capillary refill takes less than 2 seconds.  Neurological:     General:  No focal deficit present.     Mental Status: She is alert and oriented to person, place, and time. Mental status is at baseline.  Psychiatric:        Mood and Affect: Mood normal.  Behavior: Behavior normal.        Thought Content: Thought content normal.        Judgment: Judgment normal.    Results for orders placed or performed in visit on 09/27/21  Sedimentation rate  Result Value Ref Range   Sed Rate 2 0 - 30 mm/h  Cyclic citrul peptide antibody, IgG  Result Value Ref Range   Cyclic Citrullin Peptide Ab <16 UNITS      Assessment & Plan:   Problem List Items Addressed This Visit       Cardiovascular and Mediastinum   Hypertension - Primary   Relevant Orders   Comprehensive metabolic panel   CBC   Lipid panel   Other Visit Diagnoses     Annual physical exam       Relevant Orders   Cologuard   Comprehensive metabolic panel   CBC   Lipid panel   Screen for colon cancer       Relevant Orders   Cologuard          LABORATORY TESTING:  - Health maintenance labs ordered today as discussed above.           CBC, CMP, LIPIDS          TSH - high risk or symptoms          A1c - hx, high risk, symptomatic  - STI testing: deferred - Pap smear: not applicable    IMMUNIZATIONS:   - Tdap: Tetanus vaccination status reviewed: last tetanus booster within 10 years. - Influenza: Up to date - Pneumovax: Up to date - Prevnar: Up to date - HPV: Not applicable - Shingrix vaccine: Up to date - COVID-19: Up to date  SCREENING: - Mammogram: Up to date - Bone Density: Up to date - Colonoscopy: Ordered today  Discussed with patient purpose of the colonoscopy is to detect colon cancer at curable precancerous or early stages  - AAA Screening: Not applicable  -Hearing Test: Not applicable  -Spirometry: Not applicable  - Lung Cancer Screening: Not applicable    PATIENT COUNSELING:   Advised to take 1 mg of folate supplement per day if capable of pregnancy.    Sexuality: Discussed sexually transmitted diseases, partner selection, use of condoms, avoidance of unintended pregnancy, and contraceptive alternatives.    I discussed with the patient that most people either abstain from alcohol or drink within safe limits (<=14/week and <=4 drinks/occasion for males, <=7/weeks and <= 3 drinks/occasion for females) and that the risk for alcohol disorders and other health effects rises proportionally with the number of drinks per week and how often a drinker exceeds daily limits.  Discussed cessation/primary prevention of drug use and availability of treatment for abuse.   Diet: Encouraged to adjust caloric intake to maintain or achieve ideal body weight, to reduce intake of dietary saturated fat and total fat, to limit sodium intake by avoiding high sodium foods and not adding table salt, and to maintain adequate dietary potassium and calcium preferably from fresh fruits, vegetables, and low-fat dairy products. Encouraged vitamin D 1000 units and Calcium '1300mg'$  or 4 servings of dairy a day.  Emphasized the importance of regular exercise.  Injury prevention: Discussed safety belts, safety helmets, smoke detector, smoking near bedding or upholstery.   Dental health: Discussed importance of regular tooth brushing, flossing, and dental visits.  Follow up plan:  Return in about 6 months (around 04/08/2022) for routine f/u PCP.   Purcell Nails Olevia Bowens, DNP, FNP-C

## 2021-10-07 LAB — LIPID PANEL
Cholesterol: 134 mg/dL (ref 0–200)
HDL: 57.9 mg/dL (ref >39.00)
LDL Cholesterol: 52 mg/dL (ref 0–99)
NonHDL: 76.55
Total CHOL/HDL Ratio: 2
Triglycerides: 123 mg/dL (ref 0.0–149.0)
VLDL: 24.6 mg/dL (ref 0.0–40.0)

## 2021-10-07 LAB — COMPREHENSIVE METABOLIC PANEL
ALT: 17 U/L (ref 0–35)
AST: 21 U/L (ref 0–37)
Albumin: 4.1 g/dL (ref 3.5–5.2)
Alkaline Phosphatase: 54 U/L (ref 39–117)
BUN: 31 mg/dL — ABNORMAL HIGH (ref 6–23)
CO2: 29 mEq/L (ref 19–32)
Calcium: 9.7 mg/dL (ref 8.4–10.5)
Chloride: 106 mEq/L (ref 96–112)
Creatinine, Ser: 1.62 mg/dL — ABNORMAL HIGH (ref 0.40–1.20)
GFR: 31.31 mL/min — ABNORMAL LOW (ref >60.00)
Glucose, Bld: 83 mg/dL (ref 70–99)
Potassium: 4.6 mEq/L (ref 3.5–5.1)
Sodium: 142 mEq/L (ref 135–145)
Total Bilirubin: 0.4 mg/dL (ref 0.2–1.2)
Total Protein: 6.2 g/dL (ref 6.0–8.3)

## 2021-10-07 LAB — CBC
HCT: 39.5 % (ref 36.0–46.0)
Hemoglobin: 13 g/dL (ref 12.0–15.0)
MCHC: 32.9 g/dL (ref 30.0–36.0)
MCV: 89.4 fl (ref 78.0–100.0)
Platelets: 251 10*3/uL (ref 150.0–400.0)
RBC: 4.41 Mil/uL (ref 3.87–5.11)
RDW: 14.6 % (ref 11.5–15.5)
WBC: 6.7 10*3/uL (ref 4.0–10.5)

## 2021-10-10 ENCOUNTER — Other Ambulatory Visit: Payer: Self-pay | Admitting: *Deleted

## 2021-10-10 DIAGNOSIS — I6523 Occlusion and stenosis of bilateral carotid arteries: Secondary | ICD-10-CM

## 2021-10-12 DIAGNOSIS — H43823 Vitreomacular adhesion, bilateral: Secondary | ICD-10-CM | POA: Diagnosis not present

## 2021-10-12 DIAGNOSIS — H35342 Macular cyst, hole, or pseudohole, left eye: Secondary | ICD-10-CM | POA: Diagnosis not present

## 2021-10-12 DIAGNOSIS — H3554 Dystrophies primarily involving the retinal pigment epithelium: Secondary | ICD-10-CM | POA: Diagnosis not present

## 2021-10-12 DIAGNOSIS — Z961 Presence of intraocular lens: Secondary | ICD-10-CM | POA: Diagnosis not present

## 2021-10-12 DIAGNOSIS — H35363 Drusen (degenerative) of macula, bilateral: Secondary | ICD-10-CM | POA: Diagnosis not present

## 2021-10-12 DIAGNOSIS — H353221 Exudative age-related macular degeneration, left eye, with active choroidal neovascularization: Secondary | ICD-10-CM | POA: Diagnosis not present

## 2021-10-12 DIAGNOSIS — H353212 Exudative age-related macular degeneration, right eye, with inactive choroidal neovascularization: Secondary | ICD-10-CM | POA: Diagnosis not present

## 2021-10-13 DIAGNOSIS — Z1211 Encounter for screening for malignant neoplasm of colon: Secondary | ICD-10-CM | POA: Diagnosis not present

## 2021-10-17 ENCOUNTER — Ambulatory Visit: Payer: Medicare Other | Admitting: Rheumatology

## 2021-10-20 ENCOUNTER — Telehealth: Payer: Self-pay | Admitting: Family Medicine

## 2021-10-20 LAB — COLOGUARD: COLOGUARD: POSITIVE — AB

## 2021-10-20 MED ORDER — FAMOTIDINE 40 MG PO TABS: 40.0000 mg | ORAL_TABLET | Freq: Every day | ORAL | 1 refills | Status: DC

## 2021-10-20 NOTE — Telephone Encounter (Signed)
Medication: famotidine (PEPCID) 40 MG tablet  ? ?Has the patient contacted their pharmacy? No. ? ?[facility]: [facility] ?[address] [place], [place] [address] ?[phone] ?

## 2021-10-20 NOTE — Telephone Encounter (Signed)
Refill sent.

## 2021-10-20 NOTE — Addendum Note (Signed)
Addended by: Caleen Jobs B on: 10/20/2021 11:05 AM ? ? Modules accepted: Orders ? ?

## 2021-10-20 NOTE — Progress Notes (Signed)
Your Cologuard test was positive. It is recommended that we do a colonoscopy to look for any abnormalities. I will place a referral to GI and they will call you to schedule.

## 2021-10-21 ENCOUNTER — Ambulatory Visit: Payer: Medicare Other | Admitting: Internal Medicine

## 2021-10-24 ENCOUNTER — Other Ambulatory Visit: Payer: Self-pay

## 2021-10-24 ENCOUNTER — Ambulatory Visit: Payer: Medicare Other | Admitting: Physician Assistant

## 2021-10-24 ENCOUNTER — Ambulatory Visit (HOSPITAL_COMMUNITY)
Admission: RE | Admit: 2021-10-24 | Discharge: 2021-10-24 | Disposition: A | Discharge status: Home / Self Care | Payer: Medicare Other | Source: Ambulatory Visit | Attending: Surgery | Admitting: Surgery

## 2021-10-24 VITALS — BP 126/70 | HR 72 | Temp 97.8°F | Resp 18 | Ht 63.0 in | Wt 180.0 lb

## 2021-10-24 DIAGNOSIS — I6521 Occlusion and stenosis of right carotid artery: Secondary | ICD-10-CM | POA: Diagnosis not present

## 2021-10-24 DIAGNOSIS — I6523 Occlusion and stenosis of bilateral carotid arteries: Secondary | ICD-10-CM | POA: Insufficient documentation

## 2021-10-24 DIAGNOSIS — I6522 Occlusion and stenosis of left carotid artery: Secondary | ICD-10-CM

## 2021-10-24 NOTE — Progress Notes (Signed)
?Office Note  ? ? ? ?CC:  follow up ?Requesting Provider:  Mosie Lukes, MD ? ?HPI: Emily Mitchell is a 76 y.o. (May 09, 1946) female who presents follow up of carotid artery stenosis. She has history of prior right carotid endarterectomy and developed occlusion. She has remained asymptomatic.  She has is being followed for her left ICA stenosis of 40 to 59%.  She currently denies any monocular blindness, lateralizing weakness, slurred speech, or facial drooping. She tells me she has macular degeneration so she sees her ophthalmologist every 6 weeks ? ?She denies any claudication, rest pain or tissue loss. She does still have some discomfort behind her right knee. She says it does not bother her to ambulate unless its far distances. She bikes on stationary bike about 2 miles a day without discomfort.  ?  ?The pt is not on a statin for cholesterol management. Takes Zetia ?The pt is on a daily aspirin.   Other AC: Plavix ?The pt is on BB for hypertension.   ?The pt is not diabetic.  ?Tobacco hx: Former, quit 1996 ? ?Past Medical History:  ?Diagnosis Date  ? Acid reflux   ? Anemia 07/05/2014  ? Anxiety and depression 12/14/2008  ? Qualifier: Diagnosis of  By: [name] LPN, Almyra Free    ? Arthritis   ? "fingers, toes, knees, joints" (07/18/2018)  ? Asthma   ? Atherosclerotic heart disease of native coronary artery without angina pectoris 07/19/2018  ? Atypical chest pain 05/23/2015  ? Back pain 02/02/2019  ? Breast mass, left 04/28/2019  ? CAD in native artery 07/19/2018  ? Candidal skin infection 02/28/2018  ? Carotid artery disease ([place]) 06/16/2015  ? S/p CEA   ? Carotid artery occlusion   ? a. R carotid occluded, 14-48% LICA.  ? Chest pain 07/18/2018  ? CHF (congestive heart failure) ([place])   ? Chicken pox as a child  ? Chronic kidney disease   ? Bright's Disease at age 4   ? Chronic obstructive pulmonary disease ([place]) 12/14/2008  ? Formatting of this note might be different from the original. Overview:  Spirometry  05/12/2013: FEV1 72% predicted FEV1 FVC ratio 73 predicted FEF 25 75  43% predicted  Last Assessment & Plan:  OK to stop taking advair Use albuterol as needed only Check oxygen levels during sleep - we will call you with results Call if symptoms worse  ? Chronic rhinitis 06/14/2018  ? CKD (chronic kidney disease), stage IV ([place])   ? Follows with [facility], Dr Alonza Smoker   ? Congestive heart failure ([place]) 11/30/2015  ? Formatting of this note might be different from the original. Last Assessment & Plan:  No recent exacerbation  ? Constipation 12/17/2008  ? Qualifier: Diagnosis of  By: Westly Pam.   ? COPD with asthma ([place]) 11/06/2017  ? Essential hypertension 10/18/2015  ? Formatting of this note might be different from the original. Last Assessment & Plan:  poorlycontrolled and under a great deal of stress, add Metoprolol XL 25 mg daily. Encouraged heart healthy diet such as the DASH diet and exercise as tolerated.  ? Essential tremor 11/05/2013  ? Exudative age-related macular degeneration of right eye with active choroidal neovascularization ([place]) 08/26/2015  ? Gastro-esophageal reflux disease without esophagitis 12/14/2008  ? Formatting of this note might be different from the original. Formatting of this note might be different from the original. Formatting of this note might be different from the original. Overview:  Qualifier: Diagnosis of  By: [name] LPN, Almyra Free   Last Assessment & Plan:  Avoid offending foods, start probiotics. Do not eat large meals in late evening and consider raising head of bed. Last Assessment  ? GERD (gastroesophageal reflux disease)   ? H. pylori infection 10/19/2013  ? H/O multiple allergies 07/14/2018  ? Helicobacter pylori (H. pylori) as the cause of diseases classified elsewhere 10/19/2013  ? Formatting of this note might be different from the original. Last Assessment & Plan:  Tetracycline, Pepto Bismol, Flagyl and Omeprazole.  ? Hepatitis 1970s  ? "don't know  for sure which one it was; think it was A" (07/18/2018)  ? History of food allergy 08/14/2018  ? History of pulmonary embolism 07/17/2018  ? Hoarseness 03/09/2019  ? Hypercalcemia 03/09/2019  ? Hyperglycemia 03/14/2016  ? Hyperlipidemia, mixed 05/26/2013  ? Crestor caused N/V Did not tolerate Lipitor   ? Hypertension   ? Hypothyroidism   ? Knee pain, bilateral 12/17/2012  ? Left hip pain 05/23/2015  ? Low back pain 09/19/2015  ? Lower urinary tract infectious disease 10/19/2013  ? Lymphadenopathy 10/18/2017  ? Macular degeneration of both eyes 12/16/2015  ? Right eye is wet Left Eye is dry Shots every 11 to 12 weeks in right eye at opthamologist office  ? Medicare annual wellness visit, subsequent 02/21/2015  ? Mixed anxiety depressive disorder 12/14/2008  ? Formatting of this note might be different from the original. Overview:  Qualifier: Diagnosis of  By: [name] LPN, Almyra Free  Patient had increased  Tremors on Bupropion  Last Assessment & Plan:  Is struggling with the stress of her 3 year old granddaughter whom she raised moving back with her own mother, the patient is estranged from her daughter so she is very upset. Increase Citalopram 40 mg daily   ? Mumps 76 yrs old  ? Neck pain 04/22/2017  ? Numbness of finger 12/17/2012  ? Occlusion and stenosis of carotid artery without mention of cerebral infarction 12/17/2012  ? OSA (obstructive sleep apnea) 12/14/2008  ? PSG  06/2013-AHI 6/h 08/2011 (bethany) AHI 4/h No longer needs CPAP   ? Preventative health care 03/14/2016  ? Pulmonary emboli ([place]) 05/26/2013  ? Renal insufficiency 07/17/2018  ? Right shoulder pain 10/18/2017  ? Sensation of foreign body in larynx 04/08/2019  ? Skin lesion of back 03/27/2021  ? Sleep apnea   ? "don't use CPAP anymore; dr said I didn't have to" (07/18/2018)  ? Static tremor 11/05/2013  ? Formatting of this note might be different from the original. Last Assessment & Plan:  Follow[facility] with [facility]  ? Statin intolerance 09/29/2019  ?  Stroke [facility])   ? "been told I've had some strokes; didn't know I'd had them" (07/18/2018)  ? Thyroid disease   ? Tremor   ? Vitamin D deficiency 03/03/2018  ? Vitreomacular adhesion of both eyes 01/07/2019  ? Formatting of this note might be different from the original. no traction  ? ? ?Past Surgical History:  ?Procedure Laterality Date  ? APPENDECTOMY  1984  ? CARDIAC CATHETERIZATION  ~ 2006  ? CAROTID ANGIOGRAPHY Left 08/20/2018  ? Procedure: CAROTID ANGIOGRAPHY;  Surgeon: Serafina Mitchell, MD;  Location: [place] [facility];  Service: Cardiovascular;  Laterality: Left;  ? CAROTID ENDARTERECTOMY Right 05/10/07  ? cea  ? CATARACT EXTRACTION Left   ? CATARACT EXTRACTION W/ INTRAOCULAR LENS  IMPLANT, BILATERAL Bilateral   ? CORONARY ANGIOPLASTY WITH STENT PLACEMENT  07/18/2018  ? CORONARY STENT INTERVENTION N/A 07/18/2018  ?  Procedure: CORONARY STENT INTERVENTION;  Surgeon: Lorretta Harp, MD;  Location: [place] [facility];  Service: Cardiovascular;  Laterality: N/A;  ? INTRAVASCULAR PRESSURE WIRE/FFR STUDY N/A 07/18/2018  ? Procedure: INTRAVASCULAR PRESSURE WIRE/FFR STUDY;  Surgeon: Lorretta Harp, MD;  Location: [place] [facility];  Service: Cardiovascular;  Laterality: N/A;  ? JOINT REPLACEMENT    ? LEFT HEART CATH AND CORONARY ANGIOGRAPHY N/A 07/18/2018  ? Procedure: LEFT HEART CATH AND CORONARY ANGIOGRAPHY;  Surgeon: Lorretta Harp, MD;  Location: [place] [facility];  Service: Cardiovascular;  Laterality: N/A;  ? MULTIPLE TOOTH EXTRACTIONS    ? TOTAL KNEE ARTHROPLASTY Bilateral 2005 - 2011  ? right - left  ? VAGINAL HYSTERECTOMY  1984  ? WISDOM TOOTH EXTRACTION    ? ? ?Social History  ? ?Socioeconomic History  ? Marital status: Divorced  ?  Spouse name: Not on file  ? Number of children: 2  ? Years of education: Not on file  ? Highest education level: Not on file  ?Occupational History  ? Not on file  ?Tobacco Use  ? Smoking status: Former  ?  Packs/day: 2.00  ?  Years: 30.00  ?  Pack years: 60.00  ?   Types: Cigarettes  ?  Quit date: 12/03/1994  ?  Years since quitting: 26.9  ? Smokeless tobacco: Never  ?Vaping Use  ? Vaping Use: Never used  ?Substance and Sexual Activity  ? Alcohol use: Yes  ?  Comment: speci

## 2021-10-26 ENCOUNTER — Encounter: Payer: Self-pay | Admitting: Gastroenterology

## 2021-10-31 DIAGNOSIS — D631 Anemia in chronic kidney disease: Secondary | ICD-10-CM | POA: Diagnosis not present

## 2021-10-31 DIAGNOSIS — I129 Hypertensive chronic kidney disease with stage 1 through stage 4 chronic kidney disease, or unspecified chronic kidney disease: Secondary | ICD-10-CM | POA: Diagnosis not present

## 2021-10-31 DIAGNOSIS — I509 Heart failure, unspecified: Secondary | ICD-10-CM | POA: Diagnosis not present

## 2021-10-31 DIAGNOSIS — I6529 Occlusion and stenosis of unspecified carotid artery: Secondary | ICD-10-CM | POA: Diagnosis not present

## 2021-10-31 DIAGNOSIS — N184 Chronic kidney disease, stage 4 (severe): Secondary | ICD-10-CM | POA: Diagnosis not present

## 2021-10-31 DIAGNOSIS — R251 Tremor, unspecified: Secondary | ICD-10-CM | POA: Diagnosis not present

## 2021-10-31 DIAGNOSIS — E785 Hyperlipidemia, unspecified: Secondary | ICD-10-CM | POA: Diagnosis not present

## 2021-10-31 DIAGNOSIS — N2581 Secondary hyperparathyroidism of renal origin: Secondary | ICD-10-CM | POA: Diagnosis not present

## 2021-10-31 LAB — COMPREHENSIVE METABOLIC PANEL
Albumin: 4.1 (ref 3.5–5.0)
Calcium: 9.8 (ref 8.7–10.7)
Globulin: 2.1
eGFR: 35

## 2021-10-31 LAB — BASIC METABOLIC PANEL
BUN: 28 — AB (ref 4–21)
CO2: 29 — AB (ref 13–22)
Chloride: 107 (ref 99–108)
Creatinine: 1.6 — AB (ref 0.5–1.1)
Glucose: 87
Potassium: 4.8 mEq/L (ref 3.5–5.1)
Sodium: 140 (ref 137–147)

## 2021-10-31 LAB — HEPATIC FUNCTION PANEL
ALT: 18 U/L (ref 7–35)
AST: 24 (ref 13–35)
Alkaline Phosphatase: 64 (ref 25–125)
Bilirubin, Total: 0.3

## 2021-11-01 ENCOUNTER — Ambulatory Visit: Payer: Medicare Other | Admitting: Internal Medicine

## 2021-11-08 ENCOUNTER — Ambulatory Visit: Payer: Medicare Other | Admitting: Rheumatology

## 2021-11-18 ENCOUNTER — Other Ambulatory Visit: Payer: Self-pay | Admitting: Family Medicine

## 2021-11-21 ENCOUNTER — Telehealth: Payer: Self-pay | Admitting: Family Medicine

## 2021-11-21 DIAGNOSIS — E785 Hyperlipidemia, unspecified: Secondary | ICD-10-CM

## 2021-11-21 MED ORDER — EZETIMIBE 10 MG PO TABS: 10.0000 mg | ORAL_TABLET | Freq: Every day | ORAL | 1 refills | Status: DC

## 2021-11-21 MED ORDER — FLUOXETINE HCL 20 MG PO TABS: 20.0000 mg | ORAL_TABLET | Freq: Every day | ORAL | 1 refills | Status: DC

## 2021-11-21 NOTE — Telephone Encounter (Signed)
Pt called stating that she needed refills on two prescriptions. Pt also stated that [facility].  ? ?Medication:  ? ?FLUoxetine (PROZAC) 20 MG tablet [[id number]]  ? ?ezetimibe (ZETIA) 10 MG tablet [[id number]]  ? ?Has the patient contacted their pharmacy? No. ?(If no, request that the patient contact the pharmacy for the refill.) ?(If yes, when and what did the pharmacy advise?) ? ?Preferred Pharmacy (with phone number or street name):  ? ?[id number] [place], [place] - [address] ?[id number] ?Phone:  [phone] Fax:  [phone] ? ?Agent: Please be advised that RX refills may take up to 3 business days. We ask that you follow-up with your pharmacy. ? ?

## 2021-11-21 NOTE — Telephone Encounter (Signed)
Refills sent

## 2021-12-12 ENCOUNTER — Ambulatory Visit: Payer: Medicare Other | Admitting: Gastroenterology

## 2021-12-12 ENCOUNTER — Telehealth: Payer: Self-pay

## 2021-12-12 ENCOUNTER — Encounter: Payer: Self-pay | Admitting: Gastroenterology

## 2021-12-12 VITALS — BP 122/80 | HR 70 | Ht 63.0 in | Wt 181.0 lb

## 2021-12-12 DIAGNOSIS — R195 Other fecal abnormalities: Secondary | ICD-10-CM | POA: Diagnosis not present

## 2021-12-12 NOTE — Telephone Encounter (Signed)
[facility] Pre-operative Risk Assessment  ?   ?Request for surgical clearance:     Endoscopy Procedure ? ?What type of surgery is being performed?     Colonoscopy ? ?When is this surgery scheduled?     02-13-2022 ? ?What type of clearance is required ?   Pharmacy ? ?Are there any medications that need to be held prior to surgery and how long? Plavix 5 day hold ? ?Practice name and name of physician performing surgery?      [facility] Jackquline Denmark ? ?What is your office phone and fax number?      Phone- [phone]  Fax- [phone] ? ?Anesthesia type (None, local, MAC, general) ?       MAC ? ?

## 2021-12-12 NOTE — Patient Instructions (Addendum)
If you are age 74 or older, your body mass index should be between 23-30. Your Body mass index is 32.06 kg/m?Marland Kitchen If this is out of the aforementioned range listed, please consider follow up with your Primary Care Provider. ? ?If you are age 63 or younger, your body mass index should be between 19-25. Your Body mass index is 32.06 kg/m?Marland Kitchen If this is out of the aformentioned range listed, please consider follow up with your Primary Care Provider.  ? ?________________________________________________________ ? ?The [facility] providers would like to encourage you to use [facility] to communicate with providers for non-urgent requests or questions.  Due to long hold times on the telephone, sending your provider a message by [facility] may be a faster and more efficient way to get a response.  Please allow 48 business hours for a response.  Please remember that this is for non-urgent requests.  ?_______________________________________________________ ? ?You have been scheduled for a colonoscopy. Please follow written instructions given to you at your visit today.  ?Please pick up your prep supplies at the pharmacy within the next 1-3 days. ?If you use inhalers (even only as needed), please bring them with you on the day of your procedure. ? ?You will be contacted by our office prior to your procedure for directions on holding your Plavix.  If you do not hear from our office 2 week prior to your scheduled procedure, please call [phone] to discuss.  ? ?Please call with any questions or concerns. ? ?Thank you, ? ?Dr. Jackquline Denmark ? ? ?

## 2021-12-12 NOTE — Progress Notes (Signed)
? ? ?Chief Complaint:  ? ?Referring Provider:  Mosie Lukes, MD    ? ? ?ASSESSMENT AND PLAN;  ? ?#1. + cologuard  ? ? ?Plan: ?-Colon after cardio clearence, after hold plavix 5 days ? ? ?Discussed risks & benefits of colonoscopy. Risks including rare perforation req laparotomy, bleeding after bx/polypectomy req blood transfusion, rarely missing neoplasms, risks of anesthesia/sedation, rare risk of damage to internal organs. Benefits outweigh the risks. Patient agrees to proceed. All the questions were answered. Pt consents to proceed. ? ?HPI:   ? ?Emily Mitchell is a 76 y.o. female  ?With H/O COPD, CAD s/p DES 2019 on Plavix, CKD, HLD, HTN ? ?+ cologuard ? ?No nausea, vomiting, heartburn (on nexium), regurgitation, odynophagia or dysphagia.  No significant diarrhea or constipation.  No melena or hematochezia. No unintentional weight loss. No abdominal pain. ? ? ?Past GI procedures: ?Colon 2010- woke up-had a bad experience. ? ?CT AP 01/2021 without contrast. ?Cholelithiasis. ?Sigmoid diverticulosis without evidence of diverticulitis. ?No acute intra-abdominal or intrapelvic abnormalities. ?Spinal stenosis L4-L5. ?Aortic Atherosclerosis (ICD10-I70.0). ? ?Past Medical History:  ?Diagnosis Date  ? Acid reflux   ? Anemia 07/05/2014  ? Anxiety and depression 12/14/2008  ? Qualifier: Diagnosis of  By: [name] LPN, Almyra Free    ? Arthritis   ? "fingers, toes, knees, joints" (07/18/2018)  ? Asthma   ? Atherosclerotic heart disease of native coronary artery without angina pectoris 07/19/2018  ? Atypical chest pain 05/23/2015  ? Back pain 02/02/2019  ? Breast mass, left 04/28/2019  ? CAD in native artery 07/19/2018  ? Candidal skin infection 02/28/2018  ? Carotid artery disease ([place]) 06/16/2015  ? S/p CEA   ? Carotid artery occlusion   ? a. R carotid occluded, 51-88% LICA.  ? Chest pain 07/18/2018  ? CHF (congestive heart failure) ([place])   ? Chicken pox as a child  ? Chronic kidney disease   ? Bright's Disease at age 61 , stage  15  ? Chronic obstructive pulmonary disease ([place]) 12/14/2008  ? Formatting of this note might be different from the original. Overview:  Spirometry 05/12/2013: FEV1 72% predicted FEV1 FVC ratio 73 predicted FEF 25 75  43% predicted  Last Assessment & Plan:  OK to stop taking advair Use albuterol as needed only Check oxygen levels during sleep - we will call you with results Call if symptoms worse  ? Chronic rhinitis 06/14/2018  ? CKD (chronic kidney disease), stage IV ([place])   ? Follows with [facility], Dr Alonza Smoker   ? Congestive heart failure ([place]) 11/30/2015  ? Formatting of this note might be different from the original. Last Assessment & Plan:  No recent exacerbation  ? Constipation 12/17/2008  ? Qualifier: Diagnosis of  By: Westly Pam.   ? COPD with asthma ([place]) 11/06/2017  ? Essential hypertension 10/18/2015  ? Formatting of this note might be different from the original. Last Assessment & Plan:  poorlycontrolled and under a great deal of stress, add Metoprolol XL 25 mg daily. Encouraged heart healthy diet such as the DASH diet and exercise as tolerated.  ? Essential tremor 11/05/2013  ? Exudative age-related macular degeneration of right eye with active choroidal neovascularization ([place]) 08/26/2015  ? Gastro-esophageal reflux disease without esophagitis 12/14/2008  ? Formatting of this note might be different from the original. Formatting of this note might be different from the original. Formatting of this note might be different from the original. Overview:  Qualifier: Diagnosis of  By:  [name] LPN, Almyra Free   Last Assessment & Plan:  Avoid offending foods, start probiotics. Do not eat large meals in late evening and consider raising head of bed. Last Assessment  ? GERD (gastroesophageal reflux disease)   ? H. pylori infection 10/19/2013  ? H/O multiple allergies 07/14/2018  ? Helicobacter pylori (H. pylori) as the cause of diseases classified elsewhere 10/19/2013  ? Formatting of this note  might be different from the original. Last Assessment & Plan:  Tetracycline, Pepto Bismol, Flagyl and Omeprazole.  ? Hepatitis 1970s  ? "don't know for sure which one it was; think it was A" (07/18/2018)  ? History of food allergy 08/14/2018  ? History of pulmonary embolism 07/17/2018  ? Hoarseness 03/09/2019  ? Hypercalcemia 03/09/2019  ? Hyperglycemia 03/14/2016  ? Hyperlipidemia, mixed 05/26/2013  ? Crestor caused N/V Did not tolerate Lipitor   ? Hypertension   ? Hypothyroidism   ? Knee pain, bilateral 12/17/2012  ? Left hip pain 05/23/2015  ? Low back pain 09/19/2015  ? Lower urinary tract infectious disease 10/19/2013  ? Lymphadenopathy 10/18/2017  ? Macular degeneration of both eyes 12/16/2015  ? Right eye is wet Left Eye is dry Shots every 11 to 12 weeks in right eye at opthamologist office  ? Medicare annual wellness visit, subsequent 02/21/2015  ? Mixed anxiety depressive disorder 12/14/2008  ? Formatting of this note might be different from the original. Overview:  Qualifier: Diagnosis of  By: [name] LPN, Almyra Free  Patient had increased  Tremors on Bupropion  Last Assessment & Plan:  Is struggling with the stress of her 54 year old granddaughter whom she raised moving back with her own mother, the patient is estranged from her daughter so she is very upset. Increase Citalopram 40 mg daily   ? Mumps 76 yrs old  ? Neck pain 04/22/2017  ? Numbness of finger 12/17/2012  ? Occlusion and stenosis of carotid artery without mention of cerebral infarction 12/17/2012  ? OSA (obstructive sleep apnea) 12/14/2008  ? PSG  06/2013-AHI 6/h 08/2011 (bethany) AHI 4/h No longer needs CPAP   ? Preventative health care 03/14/2016  ? Pulmonary emboli ([place]) 05/26/2013  ? Renal insufficiency 07/17/2018  ? Right shoulder pain 10/18/2017  ? Sensation of foreign body in larynx 04/08/2019  ? Skin lesion of back 03/27/2021  ? Sleep apnea   ? "don't use CPAP anymore; dr said I didn't have to" (07/18/2018)  ? Static tremor 11/05/2013  ?  Formatting of this note might be different from the original. Last Assessment & Plan:  Follows with [facility]  ? Statin intolerance 09/29/2019  ? Stroke [facility])   ? "been told I've had some strokes; didn't know I'd had them" (07/18/2018)  ? Thyroid disease   ? Tremor   ? Vitamin D deficiency 03/03/2018  ? Vitreomacular adhesion of both eyes 01/07/2019  ? Formatting of this note might be different from the original. no traction  ? ? ?Past Surgical History:  ?Procedure Laterality Date  ? APPENDECTOMY  1984  ? CARDIAC CATHETERIZATION  ~ 2006  ? CAROTID ANGIOGRAPHY Left 08/20/2018  ? Procedure: CAROTID ANGIOGRAPHY;  Surgeon: Serafina Mitchell, MD;  Location: [place] [facility];  Service: Cardiovascular;  Laterality: Left;  ? CAROTID ENDARTERECTOMY Right 05/10/07  ? cea  ? CATARACT EXTRACTION Left   ? CATARACT EXTRACTION W/ INTRAOCULAR LENS  IMPLANT, BILATERAL Bilateral   ? CORONARY ANGIOPLASTY WITH STENT PLACEMENT  07/18/2018  ? CORONARY STENT INTERVENTION N/A 07/18/2018  ? Procedure:  CORONARY STENT INTERVENTION;  Surgeon: Lorretta Harp, MD;  Location: [place] [facility];  Service: Cardiovascular;  Laterality: N/A;  ? INTRAVASCULAR PRESSURE WIRE/FFR STUDY N/A 07/18/2018  ? Procedure: INTRAVASCULAR PRESSURE WIRE/FFR STUDY;  Surgeon: Lorretta Harp, MD;  Location: [facility] [facility];  Service: Cardiovascular;  Laterality: N/A;  ? JOINT REPLACEMENT    ? LEFT HEART CATH AND CORONARY ANGIOGRAPHY N/A 07/18/2018  ? Procedure: LEFT HEART CATH AND CORONARY ANGIOGRAPHY;  Surgeon: Lorretta Harp, MD;  Location: [place] [facility];  Service: Cardiovascular;  Laterality: N/A;  ? MULTIPLE TOOTH EXTRACTIONS    ? TOTAL KNEE ARTHROPLASTY Bilateral 2005 - 2011  ? right - left  ? VAGINAL HYSTERECTOMY  1984  ? WISDOM TOOTH EXTRACTION    ? ? ?Family History  ?Problem Relation Age of Onset  ? Stroke Mother   ?     mini stroke  ? Kidney disease Mother   ? Heart failure Mother   ? Hypertension Mother   ? Diabetes Sister   ?     type 2   ? Kidney disease Sister   ? Allergic rhinitis Sister   ? Osteoarthritis Sister   ? Hyperlipidemia Sister   ? Heart attack Maternal Grandfather   ? Colon cancer Neg Hx   ? Stomach cancer Neg Hx   ? Re

## 2021-12-13 ENCOUNTER — Other Ambulatory Visit: Payer: Self-pay

## 2021-12-13 DIAGNOSIS — K219 Gastro-esophageal reflux disease without esophagitis: Secondary | ICD-10-CM | POA: Insufficient documentation

## 2021-12-13 NOTE — Telephone Encounter (Signed)
? ?  Patient Name: Emily Mitchell  ?DOB: 1946-01-03 ?MRN: [id number] ? ?Primary Cardiologist: Jenean Lindau, MD ? ?Chart reviewed as part of pre-operative protocol coverage.  Patient has an upcoming visit scheduled with Dr. Geraldo Pitter on 12/15/2021 at which time clearance can be addressed in case there are any issues that would impact surgical recommendations. ? ?Endoscopy procedure is scheduled for 02/13/2022 as below. I added preop FYI to appointment note so that provider is aware to address at time of outpatient visit.  Per office protocol the cardiology provider should forward their finalize clearance decision and recommendations regarding antiplatelet therapy to the requesting party below.   ? ?I will route this message as FYI to requesting party and remove this message from the preop box as separate preop APP input not needed at this time.  Please call with any questions. ? ?Lenna Sciara, NP ?12/13/2021, 12:28 PM ? ?

## 2021-12-15 ENCOUNTER — Ambulatory Visit: Payer: Medicare Other | Admitting: Cardiology

## 2021-12-15 ENCOUNTER — Encounter: Payer: Self-pay | Admitting: Cardiology

## 2021-12-15 VITALS — BP 136/70 | HR 73 | Ht 62.0 in | Wt 180.1 lb

## 2021-12-15 DIAGNOSIS — I509 Heart failure, unspecified: Secondary | ICD-10-CM

## 2021-12-15 DIAGNOSIS — I2510 Atherosclerotic heart disease of native coronary artery without angina pectoris: Secondary | ICD-10-CM

## 2021-12-15 DIAGNOSIS — E782 Mixed hyperlipidemia: Secondary | ICD-10-CM | POA: Diagnosis not present

## 2021-12-15 DIAGNOSIS — I10 Essential (primary) hypertension: Secondary | ICD-10-CM | POA: Diagnosis not present

## 2021-12-15 NOTE — Addendum Note (Signed)
Addended by: Krzysztof Reichelt, Jonelle Sidle L on: 12/15/2021 04:50 PM   Modules accepted: Orders

## 2021-12-15 NOTE — Progress Notes (Signed)
Cardiology Office Note:    Date:  12/15/2021   ID:  Emily Mitchell, DOB 1946/05/19, MRN [id number]  PCP:  Emily Lukes, MD  Cardiologist:  Emily Lindau, MD   Referring MD: Emily Lukes, MD    ASSESSMENT:    1. Atherosclerosis of native coronary artery of native heart without angina pectoris   2. Essential hypertension   3. Hyperlipidemia, mixed    PLAN:    In order of problems listed above:  Atherosclerotic vascular disease including significant carotid atherosclerosis: Secondary prevention stressed with the patient.  Importance of compliance with diet medication stressed and she vocalized understanding.  She was advised to walk on a regular basis to the best of her ability and she promises to do so.  It has been more than a year since coronary stenting so Plavix can be discontinued and this was mentioned to the patient and she is happy about it.   Essential hypertension: Pressure stable and diet was emphasized.  Lifestyle modification urged. Mixed dyslipidemia: Diet was emphasized.  Lipids were reviewed and I discussed this with her at length. Obesity: Weight reduction stressed and she promises to do better.  Lifestyle modification urged. Patient will be seen in follow-up appointment in 6 months or earlier if the patient has any concerns    Medication Adjustments/Labs and Tests Ordered: Current medicines are reviewed at length with the patient today.  Concerns regarding medicines are outlined above.  No orders of the defined types were placed in this encounter.  No orders of the defined types were placed in this encounter.    No chief complaint on file.    History of Present Illness:    Emily Mitchell is a 76 y.o. female.  Patient has past medical history of atherosclerotic vascular disease, essential hypertension, mixed dyslipidemia and sleep apnea.  She denies any problems at this time and takes care of activities of daily living.  No chest pain orthopnea or  PND.  She has carotid atherosclerosis followed by vascular colleagues.  At the time of my evaluation, the patient is alert awake oriented and in no distress.  Past Medical History:  Diagnosis Date   Acid reflux    Anemia 07/05/2014   Anxiety and depression 12/14/2008   Qualifier: Diagnosis of  By: [name] LPN, Almyra Free     Arthritis    "fingers, toes, knees, joints" (07/18/2018)   Asthma    Atherosclerotic heart disease of native coronary artery without angina pectoris 07/19/2018   Atypical chest pain 05/23/2015   Back pain 02/02/2019   Breast mass, left 04/28/2019   CAD in native artery 07/19/2018   Candidal skin infection 02/28/2018   Carotid artery disease ([place]) 06/16/2015   S/p CEA    Carotid artery occlusion    a. R carotid occluded, 90-24% LICA.   Chest pain 07/18/2018   CHF (congestive heart failure) (HCC)    Chicken pox as a child   Chronic kidney disease    Bright's Disease at age 18 , stage 4   Chronic obstructive pulmonary disease ([place]) 12/14/2008   Formatting of this note might be different from the original. Overview:  Spirometry 05/12/2013: FEV1 72% predicted FEV1 FVC ratio 73 predicted FEF 25 75  43% predicted  Last Assessment & Plan:  OK to stop taking advair Use albuterol as needed only Check oxygen levels during sleep - we will call you with results Call if symptoms worse   Chronic rhinitis 06/14/2018   CKD (chronic kidney  disease), stage IV [facility])    Follows with [facility], Dr Alonza Smoker    Congestive heart failure ([place]) 11/30/2015   Formatting of this note might be different from the original. Last Assessment & Plan:  No recent exacerbation   Constipation 12/17/2008   Qualifier: Diagnosis of  By: Westly Pam    COPD with asthma ([place]) 11/06/2017   Essential hypertension 10/18/2015   Formatting of this note might be different from the original. Last Assessment & Plan:  poorlycontrolled and under a great deal of stress, add Metoprolol XL 25 mg  daily. Encouraged heart healthy diet such as the DASH diet and exercise as tolerated.   Essential tremor 11/05/2013   Exudative age-related macular degeneration of right eye with active choroidal neovascularization ([place]) 08/26/2015   Gastro-esophageal reflux disease without esophagitis 12/14/2008   Formatting of this note might be different from the original. Formatting of this note might be different from the original. Formatting of this note might be different from the original. Overview:  Qualifier: Diagnosis of  By: [name] LPN, Almyra Free   Last Assessment & Plan:  Avoid offending foods, start probiotics. Do not eat large meals in late evening and consider raising head of bed. Last Assessment   GERD (gastroesophageal reflux disease)    H. pylori infection 10/19/2013   H/O multiple allergies 45/62/5638   Helicobacter pylori (H. pylori) as the cause of diseases classified elsewhere 10/19/2013   Formatting of this note might be different from the original. Last Assessment & Plan:  Tetracycline, Pepto Bismol, Flagyl and Omeprazole.   Hepatitis 1970s   "don't know for sure which one it was; think it was A" (07/18/2018)   History of food allergy 08/14/2018   History of pulmonary embolism 07/17/2018   Hoarseness 03/09/2019   Hypercalcemia 03/09/2019   Hyperglycemia 03/14/2016   Hyperlipidemia, mixed 05/26/2013   Crestor caused N/V Did not tolerate Lipitor    Hypertension    Knee pain, bilateral 12/17/2012   Left hip pain 05/23/2015   Low back pain 09/19/2015   Lower urinary tract infectious disease 10/19/2013   Lymphadenopathy 10/18/2017   Macular degeneration of both eyes 12/16/2015   Right eye is wet Left Eye is dry Shots every 11 to 12 weeks in right eye at opthamologist office   Medicare annual wellness visit, subsequent 02/21/2015   Mixed anxiety depressive disorder 12/14/2008   Formatting of this note might be different from the original. Overview:  Qualifier: Diagnosis of  By: [name] LPN,  Almyra Free  Patient had increased  Tremors on Bupropion  Last Assessment & Plan:  Is struggling with the stress of her 71 year old granddaughter whom she raised moving back with her own mother, the patient is estranged from her daughter so she is very upset. Increase Citalopram 40 mg daily    Mumps 76 yrs old   Neck pain 04/22/2017   Numbness of finger 12/17/2012   Occlusion and stenosis of carotid artery without mention of cerebral infarction 12/17/2012   OSA (obstructive sleep apnea) 12/14/2008   PSG  06/2013-AHI 6/h 08/2011 ([facility]) AHI 4/h No longer needs CPAP    Preventative health care 03/14/2016   Pulmonary emboli ([place]) 05/26/2013   Recurrent infection of skin 07/19/2021   Renal insufficiency 07/17/2018   Rheumatoid factor positive 09/27/2021   Right shoulder pain 10/18/2017   Sensation of foreign body in larynx 04/08/2019   Skin lesion of back 03/27/2021   Sleep apnea    "don't use CPAP anymore; dr  said I didn't have to" (07/18/2018)   Static tremor 11/05/2013   Formatting of this note might be different from the original. Last Assessment & Plan:  Follows with [facility] neurology   Statin intolerance 09/29/2019   Stroke [facility])    "been told I've had some strokes; didn't know I'd had them" (07/18/2018)   Thyroid disease    Vitamin D deficiency 03/03/2018   Vitreomacular adhesion of both eyes 01/07/2019   Formatting of this note might be different from the original. no traction    Past Surgical History:  Procedure Laterality Date   APPENDECTOMY  1984   CARDIAC CATHETERIZATION  ~ 2006   CAROTID ANGIOGRAPHY Left 08/20/2018   Procedure: CAROTID ANGIOGRAPHY;  Surgeon: Serafina Mitchell, MD;  Location: [place] [facility];  Service: Cardiovascular;  Laterality: Left;   CAROTID ENDARTERECTOMY Right 05/10/07   cea   CATARACT EXTRACTION Left    CATARACT EXTRACTION W/ INTRAOCULAR LENS  IMPLANT, BILATERAL Bilateral    CORONARY ANGIOPLASTY WITH STENT PLACEMENT  07/18/2018   CORONARY STENT  INTERVENTION N/A 07/18/2018   Procedure: CORONARY STENT INTERVENTION;  Surgeon: Lorretta Harp, MD;  Location: [place] [facility];  Service: Cardiovascular;  Laterality: N/A;   INTRAVASCULAR PRESSURE WIRE/FFR STUDY N/A 07/18/2018   Procedure: INTRAVASCULAR PRESSURE WIRE/FFR STUDY;  Surgeon: Lorretta Harp, MD;  Location: [place] [facility];  Service: Cardiovascular;  Laterality: N/A;   JOINT REPLACEMENT     LEFT HEART CATH AND CORONARY ANGIOGRAPHY N/A 07/18/2018   Procedure: LEFT HEART CATH AND CORONARY ANGIOGRAPHY;  Surgeon: Lorretta Harp, MD;  Location: [place] [facility];  Service: Cardiovascular;  Laterality: N/A;   MULTIPLE TOOTH EXTRACTIONS     TOTAL KNEE ARTHROPLASTY Bilateral 2005 - 2011   right - left   VAGINAL HYSTERECTOMY  1984   WISDOM TOOTH EXTRACTION      Current Medications: Current Meds  Medication Sig   acetaminophen (TYLENOL) 500 MG tablet Take 500 mg by mouth as needed for headache.   albuterol (VENTOLIN HFA) 108 (90 Base) MCG/ACT inhaler Inhale 2 puffs into the lungs every 6 (six) hours as needed for wheezing or shortness of breath.   aspirin EC 81 MG tablet Take 1 tablet (81 mg total) by mouth daily.   atorvastatin (LIPITOR) 10 MG tablet TAKE 1 TABLET BY MOUTH EVERY DAY   BREZTRI AEROSPHERE 160-9-4.8 MCG/ACT AERO INHALE TWO PUFFS BY MOUTH TWICE A DAY IN THE MORNING AND IN THE EVENING   Calcium Citrate-Vitamin D 200-250 MG-UNIT TABS Take 1 tablet by mouth daily.   Cholecalciferol 25 MCG (1000 UT) tablet Take 1,000 Units by mouth daily.   Choline Fenofibrate (FENOFIBRIC ACID) 135 MG CPDR TAKE ONE CAPSULE BY MOUTH DAILY   clonazePAM (KLONOPIN) 1 MG tablet Take 1 tablet (1 mg total) by mouth 3 (three) times daily as needed. for anxiety   clopidogrel (PLAVIX) 75 MG tablet TAKE ONE TABLET BY MOUTH DAILY   cyanocobalamin 100 MCG tablet Take 500 mcg by mouth daily.   esomeprazole (NEXIUM) 40 MG capsule Take 40 mg by mouth daily.   ezetimibe (ZETIA) 10 MG tablet  Take 1 tablet (10 mg total) by mouth daily.   famotidine (PEPCID) 40 MG tablet Take 1 tablet (40 mg total) by mouth daily.   fenofibrate micronized (LOFIBRA) 134 MG capsule Take 134 mg by mouth daily.   FLUoxetine (PROZAC) 20 MG tablet Take 1 tablet (20 mg total) by mouth daily.   gabapentin (NEURONTIN) 100 MG capsule TAKE TWO  CAPSULES BY MOUTH EVERY NIGHT AT BEDTIME   levothyroxine (SYNTHROID) 100 MCG tablet Take 1 tablet (100 mcg total) by mouth daily.   montelukast (SINGULAIR) 10 MG tablet Take 10 mg by mouth at bedtime as needed for allergies.   nitroGLYCERIN (NITROSTAT) 0.4 MG SL tablet Place 0.4 mg under the tongue as needed for chest pain.   NON FORMULARY Take 1 tablet by mouth daily. Digestive advantage.   Omega-3 Fatty Acids (FISH OIL PO) Take 1 capsule by mouth daily.   Probiotic Product (VSL#3) CAPS Take 1 capsule by mouth daily.   traMADol (ULTRAM) 50 MG tablet Take 1 tablet (50 mg total) by mouth every 8 (eight) hours as needed for moderate pain or severe pain.   TRINTELLIX 10 MG TABS tablet Take 10 mg by mouth daily.     Allergies:   Niaspan [niacin er], Pantoprazole, Sulfonamide derivatives, Bupropion, Nitroglycerin, Penicillins, Statins, Apple juice, and Banana   Social History   Socioeconomic History   Marital status: Divorced    Spouse name: Not on file   Number of children: 2   Years of education: Not on file   Highest education level: Not on file  Occupational History   Not on file  Tobacco Use   Smoking status: Former    Packs/day: 2.00    Years: 30.00    Pack years: 60.00    Types: Cigarettes    Quit date: 12/03/1994    Years since quitting: 27.0   Smokeless tobacco: Never   Tobacco comments:    Quit smoking 26 years ago  Vaping Use   Vaping Use: Never used  Substance and Sexual Activity   Alcohol use: Yes    Comment: special occasions   Drug use: Never   Sexual activity: Not Currently    Comment: lives with son and friend, no dietary restrictions   Other Topics Concern   Not on file  Social History Narrative   Right handed   No caffeine   One story home   Social Determinants of Health   Financial Resource Strain: Low Risk    Difficulty of Paying Living Expenses: Not hard at all  Food Insecurity: No Food Insecurity   Worried About Charity fundraiser in the Last Year: Never true   [place] in the Last Year: Never true  Transportation Needs: No Transportation Needs   Lack of Transportation (Medical): No   Lack of Transportation (Non-Medical): No  Physical Activity: Insufficiently Active   Days of Exercise per Week: 7 days   Minutes of Exercise per Session: 20 min  Stress: No Stress Concern Present   Feeling of Stress : Not at all  Social Connections: Socially Isolated   Frequency of Communication with Friends and Family: More than three times a week   Frequency of Social Gatherings with Friends and Family: More than three times a week   Attends Religious Services: Never   Marine scientist or Organizations: No   Attends Archivist Meetings: Never   Marital Status: Divorced     Family History: The patient's family history includes Allergic rhinitis in her sister; Diabetes in her sister; Heart attack in her maternal grandfather; Heart failure in her mother; Hyperlipidemia in her sister; Hypertension in her mother; Kidney disease in her mother and sister; Osteoarthritis in her sister; Stroke in her mother. There is no history of Colon cancer, Stomach cancer, or Rectal cancer.  ROS:   Please see the history of present illness.  All other systems reviewed and are negative.  EKGs/Labs/Other Studies Reviewed:    The following studies were reviewed today: I discussed my findings with the patient at length and EKG revealed sinus rhythm and nonspecific ST-T changes   Recent Labs: 08/02/2021: TSH 0.21 10/06/2021: ALT 17; BUN 31; Creatinine, Ser 1.62; Hemoglobin 13.0; Platelets 251.0; Potassium 4.6; Sodium  142  Recent Lipid Panel    Component Value Date/Time   CHOL 134 10/06/2021 1432   CHOL 143 04/12/2020 1137   TRIG 123.0 10/06/2021 1432   HDL 57.90 10/06/2021 1432   HDL 44 04/12/2020 1137   CHOLHDL 2 10/06/2021 1432   VLDL 24.6 10/06/2021 1432   LDLCALC 52 10/06/2021 1432   LDLCALC 71 04/12/2020 1137   LDLDIRECT 103.0 07/14/2019 1323    Physical Exam:    VS:  BP 136/70   Pulse 73   Ht '5\' 2"'$  (1.575 m)   Wt 180 lb 1.3 oz (81.7 kg)   SpO2 98%   BMI 32.94 kg/m     Wt Readings from Last 3 Encounters:  12/15/21 180 lb 1.3 oz (81.7 kg)  12/12/21 181 lb (82.1 kg)  10/24/21 180 lb (81.6 kg)     GEN: Patient is in no acute distress HEENT: Normal NECK: No JVD; No carotid bruits LYMPHATICS: No lymphadenopathy CARDIAC: Hear sounds regular, 2/6 systolic murmur at the apex. RESPIRATORY:  Clear to auscultation without rales, wheezing or rhonchi  ABDOMEN: Soft, non-tender, non-distended MUSCULOSKELETAL:  No edema; No deformity  SKIN: Warm and dry NEUROLOGIC:  Alert and oriented x 3 PSYCHIATRIC:  Normal affect   Signed, Emily Lindau, MD  12/15/2021 2:25 PM    [place]

## 2021-12-15 NOTE — Patient Instructions (Signed)
Medication Instructions:  Your physician has recommended you make the following change in your medication:   Stop Plavix  *If you need a refill on your cardiac medications before your next appointment, please call your pharmacy*   Lab Work: None ordered If you have labs (blood work) drawn today and your tests are completely normal, you will receive your results only by: [place] (if you have MyChart) OR A paper copy in the mail If you have any lab test that is abnormal or we need to change your treatment, we will call you to review the results.    Testing/Procedures: Your physician has requested that you have an echocardiogram. Echocardiography is a painless test that uses sound waves to create images of your heart. It provides your doctor with information about the size and shape of your heart and how well your heart's chambers and valves are working. This procedure takes approximately one hour. There are no restrictions for this procedure.    Follow-Up: At [facility], you and your health needs are our priority.  As part of our continuing mission to provide you with exceptional heart care, we have created designated Provider Care Teams.  These Care Teams include your primary Cardiologist (physician) and Advanced Practice Providers (APPs -  Physician Assistants and Nurse Practitioners) who all work together to provide you with the care you need, when you need it.  We recommend signing up for the patient portal called "MyChart".  Sign up information is provided on this After Visit Summary.  MyChart is used to connect with patients for Virtual Visits (Telemedicine).  Patients are able to view lab/test results, encounter notes, upcoming appointments, etc.  Non-urgent messages can be sent to your provider as well.   To learn more about what you can do with MyChart, go to [email or web address].    Your next appointment:   9 month(s)  The format for your next appointment:   In  Person  Provider:   Jyl Heinz, MD   Other Instructions Echocardiogram An echocardiogram is a test that uses sound waves (ultrasound) to produce images of the heart. Images from an echocardiogram can provide important information about: Heart size and shape. The size and thickness and movement of your heart's walls. Heart muscle function and strength. Heart valve function or if you have stenosis. Stenosis is when the heart valves are too narrow. If blood is flowing backward through the heart valves (regurgitation). A tumor or infectious growth around the heart valves. Areas of heart muscle that are not working well because of poor blood flow or injury from a heart attack. Aneurysm detection. An aneurysm is a weak or damaged part of an artery wall. The wall bulges out from the normal force of blood pumping through the body. Tell a health care provider about: Any allergies you have. All medicines you are taking, including vitamins, herbs, eye drops, creams, and over-the-counter medicines. Any blood disorders you have. Any surgeries you have had. Any medical conditions you have. Whether you are pregnant or may be pregnant. What are the risks? Generally, this is a safe test. However, problems may occur, including an allergic reaction to dye (contrast) that may be used during the test. What happens before the test? No specific preparation is needed. You may eat and drink normally. What happens during the test? You will take off your clothes from the waist up and put on a hospital gown. Electrodes or electrocardiogram (ECG)patches may be placed on your chest. The electrodes or patches  are then connected to a device that monitors your heart rate and rhythm. You will lie down on a table for an ultrasound exam. A gel will be applied to your chest to help sound waves pass through your skin. A handheld device, called a transducer, will be pressed against your chest and moved over your  heart. The transducer produces sound waves that travel to your heart and bounce back (or "echo" back) to the transducer. These sound waves will be captured in real-time and changed into images of your heart that can be viewed on a video monitor. The images will be recorded on a computer and reviewed by your health care provider. You may be asked to change positions or hold your breath for a short time. This makes it easier to get different views or better views of your heart. In some cases, you may receive contrast through an IV in one of your veins. This can improve the quality of the pictures from your heart. The procedure may vary among health care providers and hospitals.   What can I expect after the test? You may return to your normal, everyday life, including diet, activities, and medicines, unless your health care provider tells you not to do that. Follow these instructions at home: It is up to you to get the results of your test. Ask your health care provider, or the department that is doing the test, when your results will be ready. Keep all follow-up visits. This is important. Summary An echocardiogram is a test that uses sound waves (ultrasound) to produce images of the heart. Images from an echocardiogram can provide important information about the size and shape of your heart, heart muscle function, heart valve function, and other possible heart problems. You do not need to do anything to prepare before this test. You may eat and drink normally. After the echocardiogram is completed, you may return to your normal, everyday life, unless your health care provider tells you not to do that. This information is not intended to replace advice given to you by your health care provider. Make sure you discuss any questions you have with your health care provider. Document Revised: 03/09/2020 Document Reviewed: 03/09/2020 Elsevier Patient Education  2021 [employer].

## 2021-12-20 NOTE — Telephone Encounter (Signed)
Patient was told to discontinue Plavix. She said she stopped taking it and on an aspirin. I told her to let us know if she has to come back in  it and she voiced understanding. She said she has follow up in 6 months with cardiology

## 2021-12-21 DIAGNOSIS — L729 Follicular cyst of the skin and subcutaneous tissue, unspecified: Secondary | ICD-10-CM | POA: Diagnosis not present

## 2021-12-21 DIAGNOSIS — D485 Neoplasm of uncertain behavior of skin: Secondary | ICD-10-CM | POA: Diagnosis not present

## 2021-12-28 DIAGNOSIS — Z961 Presence of intraocular lens: Secondary | ICD-10-CM | POA: Diagnosis not present

## 2021-12-28 DIAGNOSIS — H3554 Dystrophies primarily involving the retinal pigment epithelium: Secondary | ICD-10-CM | POA: Diagnosis not present

## 2021-12-28 DIAGNOSIS — H353212 Exudative age-related macular degeneration, right eye, with inactive choroidal neovascularization: Secondary | ICD-10-CM | POA: Diagnosis not present

## 2021-12-28 DIAGNOSIS — H35363 Drusen (degenerative) of macula, bilateral: Secondary | ICD-10-CM | POA: Diagnosis not present

## 2021-12-28 DIAGNOSIS — H43823 Vitreomacular adhesion, bilateral: Secondary | ICD-10-CM | POA: Diagnosis not present

## 2021-12-28 DIAGNOSIS — H353221 Exudative age-related macular degeneration, left eye, with active choroidal neovascularization: Secondary | ICD-10-CM | POA: Diagnosis not present

## 2022-01-03 ENCOUNTER — Ambulatory Visit (HOSPITAL_BASED_OUTPATIENT_CLINIC_OR_DEPARTMENT_OTHER)
Admission: RE | Admit: 2022-01-03 | Discharge: 2022-01-03 | Disposition: A | Discharge status: Home / Self Care | Payer: Medicare Other | Source: Ambulatory Visit | Attending: Cardiology | Admitting: Cardiology

## 2022-01-03 DIAGNOSIS — I509 Heart failure, unspecified: Secondary | ICD-10-CM | POA: Diagnosis not present

## 2022-01-03 DIAGNOSIS — I2510 Atherosclerotic heart disease of native coronary artery without angina pectoris: Secondary | ICD-10-CM | POA: Diagnosis not present

## 2022-01-03 LAB — ECHOCARDIOGRAM COMPLETE
AR max vel: 1.9 cm2
AV Area VTI: 1.74 cm2
AV Area mean vel: 1.83 cm2
AV Mean grad: 5 mmHg
AV Peak grad: 9.2 mmHg
Ao pk vel: 1.52 m/s
Area-P 1/2: 2.77 cm2
S' Lateral: 2.2 cm

## 2022-01-03 NOTE — Progress Notes (Signed)
  Echocardiogram 2D Echocardiogram has been performed.  Emily Mitchell F 01/03/2022, 2:57 PM

## 2022-01-05 ENCOUNTER — Telehealth: Payer: Self-pay | Admitting: Family Medicine

## 2022-01-05 NOTE — Telephone Encounter (Signed)
[facility] faxed over lab orders again today for pt.  Emily Mitchell [phone]

## 2022-01-05 NOTE — Telephone Encounter (Signed)
Patient states her kidney doctor from [place] was supposed to call us and ask Dr. Charlett Blake to place orders for lab work. Informed patient I did not see a note about labs, to call [facility] and see what they need. She stated she would call us back.

## 2022-01-06 ENCOUNTER — Other Ambulatory Visit: Payer: Self-pay

## 2022-01-06 DIAGNOSIS — N1832 Chronic kidney disease, stage 3b: Secondary | ICD-10-CM

## 2022-01-06 DIAGNOSIS — N189 Chronic kidney disease, unspecified: Secondary | ICD-10-CM

## 2022-01-06 NOTE — Telephone Encounter (Signed)
Pt called to follow up on status of lab orders. Advised pt that we received the fax from [facility] on 6.8.23 and that it was sitting in Dr. Frederik Pear basket for review. Pt stated that she really needed to have the labs completed by the end of the week. Advised that due to not knowing what Dr. Frederik Pear queue looks like, an accurate time for when those orders would be put in would not be able to be provided. Pt acknowledged understanding.

## 2022-01-06 NOTE — Telephone Encounter (Signed)
Labs ordered and lab appointment made. 

## 2022-01-09 ENCOUNTER — Other Ambulatory Visit (INDEPENDENT_AMBULATORY_CARE_PROVIDER_SITE_OTHER): Payer: Medicare Other

## 2022-01-09 DIAGNOSIS — N1832 Chronic kidney disease, stage 3b: Secondary | ICD-10-CM

## 2022-01-09 DIAGNOSIS — D631 Anemia in chronic kidney disease: Secondary | ICD-10-CM | POA: Diagnosis not present

## 2022-01-09 DIAGNOSIS — R82998 Other abnormal findings in urine: Secondary | ICD-10-CM

## 2022-01-09 DIAGNOSIS — N189 Chronic kidney disease, unspecified: Secondary | ICD-10-CM | POA: Diagnosis not present

## 2022-01-09 LAB — CBC WITH DIFFERENTIAL/PLATELET
Basophils Absolute: 0.1 10*3/uL (ref 0.0–0.1)
Basophils Relative: 0.9 % (ref 0.0–3.0)
Eosinophils Absolute: 0.2 10*3/uL (ref 0.0–0.7)
Eosinophils Relative: 2.8 % (ref 0.0–5.0)
HCT: 37.4 % (ref 36.0–46.0)
Hemoglobin: 12.3 g/dL (ref 12.0–15.0)
Lymphocytes Relative: 21.3 % (ref 12.0–46.0)
Lymphs Abs: 1.3 10*3/uL (ref 0.7–4.0)
MCHC: 33 g/dL (ref 30.0–36.0)
MCV: 89.8 fl (ref 78.0–100.0)
Monocytes Absolute: 0.6 10*3/uL (ref 0.1–1.0)
Monocytes Relative: 9.5 % (ref 3.0–12.0)
Neutro Abs: 4.1 10*3/uL (ref 1.4–7.7)
Neutrophils Relative %: 65.5 % (ref 43.0–77.0)
Platelets: 232 10*3/uL (ref 150.0–400.0)
RBC: 4.16 Mil/uL (ref 3.87–5.11)
RDW: 14.2 % (ref 11.5–15.5)
WBC: 6.3 10*3/uL (ref 4.0–10.5)

## 2022-01-09 LAB — URINALYSIS, MICROSCOPIC ONLY: RBC / HPF: NONE SEEN (ref 0–?)

## 2022-01-09 LAB — COMPREHENSIVE METABOLIC PANEL
ALT: 16 U/L (ref 0–35)
AST: 19 U/L (ref 0–37)
Albumin: 3.8 g/dL (ref 3.5–5.2)
Alkaline Phosphatase: 46 U/L (ref 39–117)
BUN: 32 mg/dL — ABNORMAL HIGH (ref 6–23)
CO2: 28 mEq/L (ref 19–32)
Calcium: 9.5 mg/dL (ref 8.4–10.5)
Chloride: 106 mEq/L (ref 96–112)
Creatinine, Ser: 1.48 mg/dL — ABNORMAL HIGH (ref 0.40–1.20)
GFR: 34.83 mL/min — ABNORMAL LOW (ref >60.00)
Glucose, Bld: 76 mg/dL (ref 70–99)
Potassium: 4.1 mEq/L (ref 3.5–5.1)
Sodium: 142 mEq/L (ref 135–145)
Total Bilirubin: 0.5 mg/dL (ref 0.2–1.2)
Total Protein: 5.9 g/dL — ABNORMAL LOW (ref 6.0–8.3)

## 2022-01-09 LAB — PHOSPHORUS: Phosphorus: 3.3 mg/dL (ref 2.3–4.6)

## 2022-01-10 ENCOUNTER — Other Ambulatory Visit: Payer: Medicare Other

## 2022-01-10 DIAGNOSIS — R82998 Other abnormal findings in urine: Secondary | ICD-10-CM

## 2022-01-10 LAB — PTH, INTACT AND CALCIUM
Calcium: 9.6 mg/dL (ref 8.6–10.4)
PTH: 14 pg/mL — ABNORMAL LOW (ref 16–77)

## 2022-01-10 LAB — IRON,TIBC AND FERRITIN PANEL
%SAT: 17 % (calc) (ref 16–45)
Ferritin: 46 ng/mL (ref 16–288)
Iron: 70 ug/dL (ref 45–160)
TIBC: 416 mcg/dL (calc) (ref 250–450)

## 2022-01-10 NOTE — Addendum Note (Signed)
Addended by: Lynnea Ferrier R on: 01/10/2022 10:09 AM   Modules accepted: Orders

## 2022-01-11 LAB — URINE CULTURE
MICRO NUMBER:: 13518991
SPECIMEN QUALITY:: ADEQUATE

## 2022-01-13 ENCOUNTER — Ambulatory Visit: Payer: Medicare Other | Admitting: Adult Health

## 2022-01-14 ENCOUNTER — Other Ambulatory Visit: Payer: Self-pay | Admitting: Family Medicine

## 2022-01-17 ENCOUNTER — Encounter: Payer: Self-pay | Admitting: Adult Health

## 2022-01-17 ENCOUNTER — Ambulatory Visit: Payer: Medicare Other | Admitting: Adult Health

## 2022-01-17 DIAGNOSIS — G4733 Obstructive sleep apnea (adult) (pediatric): Secondary | ICD-10-CM | POA: Diagnosis not present

## 2022-01-17 DIAGNOSIS — J449 Chronic obstructive pulmonary disease, unspecified: Secondary | ICD-10-CM

## 2022-01-17 NOTE — Assessment & Plan Note (Signed)
Controlled on present regimen.  Continue on Breztri twice daily. Continue on Singulair daily Albuterol as needed  Plan  Patient Instructions  Continue on BREZTRI 2 puffs Twice daily, rinse after use.  Singulair daily.  Albuterol inhaler As needed   Activity as tolerated .  Positional sleep , work on weight loss.  Call back if you change your mind on CPAP.  Work on healthy sleep regimen .  Follow up with Dr. Elsworth Soho or Daelin Haste NP in 4 months As needed

## 2022-01-17 NOTE — Patient Instructions (Addendum)
Continue on BREZTRI 2 puffs Twice daily, rinse after use.  Singulair daily.  Albuterol inhaler As needed   Activity as tolerated .  Positional sleep , work on weight loss.  Call back if you change your mind on CPAP.  Work on healthy sleep regimen .  Follow up with Dr. Elsworth Soho or Kerby Borner NP in 4 months As needed

## 2022-01-17 NOTE — Progress Notes (Signed)
$'@Patient'm$  ID: Emily Mitchell, female    DOB: May 15, 1946, 76 y.o.   MRN: [id number]  Chief Complaint  Patient presents with   Follow-up    Referring provider: Mosie Lukes, MD  HPI: 76 year old female former smoker followed for COPD with asthma Medical history significant for pulmonary embolism in 2014 on Coumadin from 2014-2019 History of obstructive sleep apnea very mild in 2015 she lost weight and came off of her CPAP Chronic kidney disease stage IV followed by nephrology, history of essential tremors and diastolic heart failure  TEST/EVENTS :  PSG at [facility] 08/2011 -217 lbs -AHI 4/h, TST 319 mins,supine    PSG 07/2013 -208 lbs -showed mild OSA TST - 390 mins,  AHI  6/h, RDI of 8/h The lowest desaturation was 87%      Spirometry 04/2013: FEV1 72% predicted FEV1 FVC ratio 73 Spirometry  10/2017  severe airway obstruction with ratio 60, FEV1 of 41% and FVC of 52%    Echo 08/2014 grade 2 DD   12/2014 ONO - no desatn >> dc O2  01/17/2022 Follow up : COPD with asthma , OSA  Patient returns for 47-monthfollow-up.  Patient has underlying COPD with asthma.  She remains on Breztri twice daily.  She says overall breathing is doing okay.  She gets short of breath with heavy activities.  Does try to stay active, riding stationary bike 4 miles a day. Down 20lbs over last 2 year. .  Is still able to do her own driving and housework.  She does have chronic allergies is on Singulair daily.  Denies any flare of symptoms.  Last visit patient complained of ongoing sleep issues with restless sleep snoring and waking up unrefreshed.  She has a history of very mild sleep apnea.  Patient is on Neurontin, Klonopin.  Occasionally takes tramadol. She was set up for sleep study that was completed on September 07, 2021 that showed mild obstructive sleep apnea with a AHI at 12.7/hour and SPO2 low at 85%.  We discussed her sleep study results and went over treatment options including weight loss, oral appliance  and CPAP.  Patient would like to proceed with positional sleep and weight loss. Declines CPAP .  Upper dentures and lower dental issues .   Allergies  Allergen Reactions   Niaspan [Niacin Er] Nausea And Vomiting and Swelling    Swelling in mouth   Pantoprazole     Mouth sores   Sulfonamide Derivatives Hives   Bupropion Other (See Comments)    Uncontrollable shakes   Nitroglycerin     NOT allergic - cardiology has recommended she NOT use this due to h/o severe carotid disease as it may decrease cerebral perfusion   Penicillins Hives    DID THE REACTION INVOLVE: Swelling of the face/tongue/throat, SOB, or low BP? Yes Sudden or severe rash/hives, skin peeling, or the inside of the mouth or nose? Unknown Did it require medical treatment? Unknown When did it last happen?   [age]  If all above answers are "NO", may proceed with cephalosporin use.   Statins    Apple Juice Rash   Banana Rash    Immunization History  Administered Date(s) Administered   Fluad Quad(high Dose 65+) 04/17/2019, 04/14/2020   Influenza Split 04/29/2013   Influenza, High Dose Seasonal PF 03/30/2016, 04/19/2017, 05/02/2018   Influenza,inj,Quad PF,6+ Mos 03/26/2014, 05/17/2015   Influenza-Unspecified 06/14/2021   PFIZER(Purple Top)SARS-COV-2 Vaccination 10/19/2019, 11/10/2019, 05/08/2020, 10/30/2020   Pfizer Covid-19 Vaccine Bivalent Booster 156yr& up  05/18/2021   Pneumococcal Conjugate-13 04/29/2013   Pneumococcal Polysaccharide-23 04/30/2014, 04/17/2019   Tdap 05/26/2013   Zoster Recombinat (Shingrix) 04/27/2017, 06/13/2017   Zoster, Live 07/31/2012    Past Medical History:  Diagnosis Date   Acid reflux    Anemia 07/05/2014   Anxiety and depression 12/14/2008   Qualifier: Diagnosis of  By: [name] LPN, Almyra Free     Arthritis    "fingers, toes, knees, joints" (07/18/2018)   Asthma    Atherosclerotic heart disease of native coronary artery without angina pectoris 07/19/2018   Atypical chest pain  05/23/2015   Back pain 02/02/2019   Breast mass, left 04/28/2019   CAD in native artery 07/19/2018   Candidal skin infection 02/28/2018   Carotid artery disease ([place]) 06/16/2015   S/p CEA    Carotid artery occlusion    a. R carotid occluded, 16-10% LICA.   Chest pain 07/18/2018   CHF (congestive heart failure) (HCC)    Chicken pox as a child   Chronic kidney disease    Bright's Disease at age 75 , stage 4   Chronic obstructive pulmonary disease ([place]) 12/14/2008   Formatting of this note might be different from the original. Overview:  Spirometry 05/12/2013: FEV1 72% predicted FEV1 FVC ratio 73 predicted FEF 25 75  43% predicted  Last Assessment & Plan:  OK to stop taking advair Use albuterol as needed only Check oxygen levels during sleep - we will call you with results Call if symptoms worse   Chronic rhinitis 06/14/2018   CKD (chronic kidney disease), stage IV ([place])    Follows with [facility], Dr Alonza Smoker    Congestive heart failure ([place]) 11/30/2015   Formatting of this note might be different from the original. Last Assessment & Plan:  No recent exacerbation   Constipation 12/17/2008   Qualifier: Diagnosis of  By: Westly Pam    COPD with asthma ([place]) 11/06/2017   Essential hypertension 10/18/2015   Formatting of this note might be different from the original. Last Assessment & Plan:  poorlycontrolled and under a great deal of stress, add Metoprolol XL 25 mg daily. Encouraged heart healthy diet such as the DASH diet and exercise as tolerated.   Essential tremor 11/05/2013   Exudative age-related macular degeneration of right eye with active choroidal neovascularization ([place]) 08/26/2015   Gastro-esophageal reflux disease without esophagitis 12/14/2008   Formatting of this note might be different from the original. Formatting of this note might be different from the original. Formatting of this note might be different from the original. Overview:  Qualifier:  Diagnosis of  By: [name] LPN, Almyra Free   Last Assessment & Plan:  Avoid offending foods, start probiotics. Do not eat large meals in late evening and consider raising head of bed. Last Assessment   GERD (gastroesophageal reflux disease)    H. pylori infection 10/19/2013   H/O multiple allergies 96/10/5407   Helicobacter pylori (H. pylori) as the cause of diseases classified elsewhere 10/19/2013   Formatting of this note might be different from the original. Last Assessment & Plan:  Tetracycline, Pepto Bismol, Flagyl and Omeprazole.   Hepatitis 1970s   "don't know for sure which one it was; think it was A" (07/18/2018)   History of food allergy 08/14/2018   History of pulmonary embolism 07/17/2018   Hoarseness 03/09/2019   Hypercalcemia 03/09/2019   Hyperglycemia 03/14/2016   Hyperlipidemia, mixed 05/26/2013   Crestor caused N/V Did not tolerate Lipitor    Hypertension  Knee pain, bilateral 12/17/2012   Left hip pain 05/23/2015   Low back pain 09/19/2015   Lower urinary tract infectious disease 10/19/2013   Lymphadenopathy 10/18/2017   Macular degeneration of both eyes 12/16/2015   Right eye is wet Left Eye is dry Shots every 11 to 12 weeks in right eye at opthamologist office   Medicare annual wellness visit, subsequent 02/21/2015   Mixed anxiety depressive disorder 12/14/2008   Formatting of this note might be different from the original. Overview:  Qualifier: Diagnosis of  By: [name] LPN, Almyra Free  Patient had increased  Tremors on Bupropion  Last Assessment & Plan:  Is struggling with the stress of her 54 year old granddaughter whom she raised moving back with her own mother, the patient is estranged from her daughter so she is very upset. Increase Citalopram 40 mg daily    Mumps 76 yrs old   Neck pain 04/22/2017   Numbness of finger 12/17/2012   Occlusion and stenosis of carotid artery without mention of cerebral infarction 12/17/2012   OSA (obstructive sleep apnea) 12/14/2008   PSG   06/2013-AHI 6/h 08/2011 ([place]) AHI 4/h No longer needs CPAP    Preventative health care 03/14/2016   Pulmonary emboli ([place]) 05/26/2013   Recurrent infection of skin 07/19/2021   Renal insufficiency 07/17/2018   Rheumatoid factor positive 09/27/2021   Right shoulder pain 10/18/2017   Sensation of foreign body in larynx 04/08/2019   Skin lesion of back 03/27/2021   Sleep apnea    "don't use CPAP anymore; dr said I didn't have to" (07/18/2018)   Static tremor 11/05/2013   Formatting of this note might be different from the original. Last Assessment & Plan:  Follows with [facility]y   Statin intolerance 09/29/2019   Stroke [facility])    "been told I've had some strokes; didn't know I'd had them" (07/18/2018)   Thyroid disease    Vitamin D deficiency 03/03/2018   Vitreomacular adhesion of both eyes 01/07/2019   Formatting of this note might be different from the original. no traction    Tobacco History: Social History   Tobacco Use  Smoking Status Former   Packs/day: 2.00   Years: 30.00   Total pack years: 60.00   Types: Cigarettes   Quit date: 12/03/1994   Years since quitting: 27.1  Smokeless Tobacco Never  Tobacco Comments   Quit smoking 26 years ago   Counseling given: Not Answered Tobacco comments: Quit smoking 26 years ago   Outpatient Medications Prior to Visit  Medication Sig Dispense Refill   acetaminophen (TYLENOL) 500 MG tablet Take 500 mg by mouth as needed for headache.     albuterol (VENTOLIN HFA) 108 (90 Base) MCG/ACT inhaler Inhale 2 puffs into the lungs every 6 (six) hours as needed for wheezing or shortness of breath. 18 g 3   aspirin EC 81 MG tablet Take 1 tablet (81 mg total) by mouth daily. 90 tablet 3   atorvastatin (LIPITOR) 10 MG tablet TAKE 1 TABLET BY MOUTH EVERY DAY 90 tablet 1   BREZTRI AEROSPHERE 160-9-4.8 MCG/ACT AERO INHALE TWO PUFFS BY MOUTH TWICE A DAY IN THE MORNING AND IN THE EVENING 10.7 g 5   Calcium Citrate-Vitamin D 200-250 MG-UNIT TABS  Take 1 tablet by mouth daily.     Cholecalciferol 25 MCG (1000 UT) tablet Take 1,000 Units by mouth daily.     Choline Fenofibrate (FENOFIBRIC ACID) 135 MG CPDR TAKE 1 CAPSULE BY MOUTH EVERY DAY 90 capsule 1  clonazePAM (KLONOPIN) 1 MG tablet Take 1 tablet (1 mg total) by mouth 3 (three) times daily as needed. for anxiety 90 tablet 1   cyanocobalamin 100 MCG tablet Take 500 mcg by mouth daily.     esomeprazole (NEXIUM) 40 MG capsule Take 40 mg by mouth daily.     ezetimibe (ZETIA) 10 MG tablet Take 1 tablet (10 mg total) by mouth daily. 90 tablet 1   famotidine (PEPCID) 40 MG tablet Take 1 tablet (40 mg total) by mouth daily. 90 tablet 1   FLUoxetine (PROZAC) 20 MG tablet Take 1 tablet (20 mg total) by mouth daily. 90 tablet 1   gabapentin (NEURONTIN) 100 MG capsule TAKE 2 CAPSULES BY MOUTH AT BEDTIME 180 capsule 1   levothyroxine (SYNTHROID) 100 MCG tablet Take 1 tablet (100 mcg total) by mouth daily. 90 tablet 1   montelukast (SINGULAIR) 10 MG tablet Take 10 mg by mouth at bedtime as needed for allergies.     nitroGLYCERIN (NITROSTAT) 0.4 MG SL tablet Place 0.4 mg under the tongue as needed for chest pain.     NON FORMULARY Take 1 tablet by mouth daily. Digestive advantage.     Omega-3 Fatty Acids (FISH OIL PO) Take 1 capsule by mouth daily.     Probiotic Product (VSL#3) CAPS Take 1 capsule by mouth daily.     traMADol (ULTRAM) 50 MG tablet Take 1 tablet (50 mg total) by mouth every 8 (eight) hours as needed for moderate pain or severe pain. 90 tablet 0   clopidogrel (PLAVIX) 75 MG tablet TAKE ONE TABLET BY MOUTH DAILY 90 tablet 3   TRINTELLIX 10 MG TABS tablet Take 10 mg by mouth daily.     No facility-administered medications prior to visit.     Review of Systems:   Constitutional:   No  weight loss, night sweats,  Fevers, chills,  +fatigue, or  lassitude.  HEENT:   No headaches,  Difficulty swallowing,  Tooth/dental problems, or  Sore throat,                No sneezing, itching,  ear ache, nasal congestion, post nasal drip,   CV:  No chest pain,  Orthopnea, PND, swelling in lower extremities, anasarca, dizziness, palpitations, syncope.   GI  No heartburn, indigestion, abdominal pain, nausea, vomiting, diarrhea, change in bowel habits, loss of appetite, bloody stools.   Resp:  No excess mucus, no productive cough,  No non-productive cough,  No coughing up of blood.  No change in color of mucus.  No wheezing.  No chest wall deformity  Skin: no rash or lesions.  GU: no dysuria, change in color of urine, no urgency or frequency.  No flank pain, no hematuria   MS:  No joint pain or swelling.  No decreased range of motion.  No back pain.    Physical Exam  BP 130/76 (BP Location: Left Arm, Patient Position: Sitting, Cuff Size: Large)   Pulse 75   Temp 97.8 F (36.6 C) (Oral)   Ht '5\' 2"'$  (1.575 m)   Wt 176 lb 9.6 oz (80.1 kg)   SpO2 95%   BMI 32.30 kg/m   GEN: A/Ox3; pleasant , NAD, well nourished    HEENT:  [place]/AT,   NOSE-clear, THROAT-clear, no lesions, no postnasal drip or exudate noted.   NECK:  Supple w/ fair ROM; no JVD; normal carotid impulses w/o bruits; no thyromegaly or nodules palpated; no lymphadenopathy.    RESP  Clear  P & A;  w/o, wheezes/ rales/ or rhonchi. no accessory muscle use, no dullness to percussion  CARD:  RRR, no m/r/g, no peripheral edema, pulses intact, no cyanosis or clubbing.  GI:   Soft & nt; nml bowel sounds; no organomegaly or masses detected.   Musco: Warm bil, no deformities or joint swelling noted.   Neuro: alert, no focal deficits noted.    Skin: Warm, no lesions or rashes    Lab Results:  CBC  BNP    Component Value Date/Time   BNP 38.9 09/04/2018 2258    ProBNP    Component Value Date/Time   PROBNP 60.7 02/25/2007 1515    Imaging: ECHOCARDIOGRAM COMPLETE  Result Date: 01/03/2022    ECHOCARDIOGRAM REPORT   Patient Name:   Emily Mitchell Date of Exam: 01/03/2022 Medical Rec #:  [id number]       Height:        62.0 in Accession #:    [id number]      Weight:       180.1 lb Date of Birth:  [birth year]        BSA:          1.828 m Patient Age:    89 years        BP:           156/71 mmHg Patient Gender: F               HR:           64 bpm. Exam Location:  [place] Procedure: 2D Echo, Cardiac Doppler and Color Doppler Indications:    CHF  History:        Patient has prior history of Echocardiogram examinations, most                 recent 07/18/2018. CHF; CAD.  Sonographer:    Merrie Roof RDCS Referring Phys: [place]  1. Left ventricular ejection fraction, by estimation, is 60 to 65%. The left ventricle has normal function. The left ventricle has no regional wall motion abnormalities. There is mild concentric left ventricular hypertrophy. Left ventricular diastolic parameters are consistent with Grade I diastolic dysfunction (impaired relaxation).  2. Right ventricular systolic function is normal. The right ventricular size is normal.  3. Left atrial size was mildly dilated.  4. The mitral valve is normal in structure. No evidence of mitral valve regurgitation. No evidence of mitral stenosis.  5. The aortic valve is tricuspid. Aortic valve regurgitation is not visualized. Aortic valve sclerosis is present, with no evidence of aortic valve stenosis.  6. The inferior vena cava is normal in size with greater than 50% respiratory variability, suggesting right atrial pressure of 3 mmHg. FINDINGS  Left Ventricle: Left ventricular ejection fraction, by estimation, is 60 to 65%. The left ventricle has normal function. The left ventricle has no regional wall motion abnormalities. The left ventricular internal cavity size was normal in size. There is  mild concentric left ventricular hypertrophy. Left ventricular diastolic parameters are consistent with Grade I diastolic dysfunction (impaired relaxation). Right Ventricle: The right ventricular size is normal. No increase in right ventricular wall  thickness. Right ventricular systolic function is normal. Left Atrium: Left atrial size was mildly dilated. Right Atrium: Right atrial size was normal in size. Pericardium: There is no evidence of pericardial effusion. Mitral Valve: The mitral valve is normal in structure. No evidence of mitral valve regurgitation. No evidence of mitral valve stenosis. Tricuspid Valve: The tricuspid valve is normal  in structure. Tricuspid valve regurgitation is trivial. No evidence of tricuspid stenosis. Aortic Valve: The aortic valve is tricuspid. Aortic valve regurgitation is not visualized. Aortic valve sclerosis is present, with no evidence of aortic valve stenosis. Aortic valve mean gradient measures 5.0 mmHg. Aortic valve peak gradient measures 9.2  mmHg. Aortic valve area, by VTI measures 1.74 cm. Pulmonic Valve: The pulmonic valve was normal in structure. Pulmonic valve regurgitation is trivial. No evidence of pulmonic stenosis. Aorta: The aortic arch was not well visualized and the aortic root and ascending aorta are structurally normal, with no evidence of dilitation. Venous: The pulmonary veins were not well visualized. The inferior vena cava was not well visualized. The inferior vena cava is normal in size with greater than 50% respiratory variability, suggesting right atrial pressure of 3 mmHg. IAS/Shunts: No atrial level shunt detected by color flow Doppler.  LEFT VENTRICLE PLAX 2D LVIDd:         3.30 cm   Diastology LVIDs:         2.20 cm   LV e' medial:    5.22 cm/s LV PW:         1.30 cm   LV E/e' medial:  18.4 LV IVS:        1.30 cm   LV e' lateral:   4.68 cm/s LVOT diam:     2.00 cm   LV E/e' lateral: 20.6 LV SV:         65 LV SV Index:   36 LVOT Area:     3.14 cm  RIGHT VENTRICLE RV Basal diam:  3.10 cm RV S prime:     10.75 cm/s TAPSE (M-mode): 2.1 cm LEFT ATRIUM             Index        RIGHT ATRIUM           Index LA diam:        4.50 cm 2.46 cm/m   RA Area:     17.20 cm LA Vol (A2C):   66.0 ml 36.10 ml/m   RA Volume:   36.00 ml  19.69 ml/m LA Vol (A4C):   61.1 ml 33.42 ml/m LA Biplane Vol: 64.6 ml 35.33 ml/m  AORTIC VALVE AV Area (Vmax):    1.90 cm AV Area (Vmean):   1.83 cm AV Area (VTI):     1.74 cm AV Vmax:           152.00 cm/s AV Vmean:          110.000 cm/s AV VTI:            0.373 m AV Peak Grad:      9.2 mmHg AV Mean Grad:      5.0 mmHg LVOT Vmax:         91.70 cm/s LVOT Vmean:        64.200 cm/s LVOT VTI:          0.207 m LVOT/AV VTI ratio: 0.55  AORTA Ao Root diam: 2.70 cm Ao Asc diam:  3.00 cm MITRAL VALVE                TRICUSPID VALVE MV Area (PHT): 2.77 cm     TR Peak grad:   19.5 mmHg MV Decel Time: 274 msec     TR Vmax:        221.00 cm/s MV E velocity: 96.30 cm/s MV A velocity: 129.00 cm/s  SHUNTS MV E/A ratio:  0.75  Systemic VTI:  0.21 m                             Systemic Diam: 2.00 cm Shirlee More MD Electronically signed by Shirlee More MD Signature Date/Time: 01/03/2022/4:35:00 PM    Final           No data to display          No results found for: "NITRICOXIDE"      Assessment & Plan:   COPD with asthma ([place]) Controlled on present regimen.  Continue on Breztri twice daily. Continue on Singulair daily Albuterol as needed  Plan  Patient Instructions  Continue on BREZTRI 2 puffs Twice daily, rinse after use.  Singulair daily.  Albuterol inhaler As needed   Activity as tolerated .  Positional sleep , work on weight loss.  Call back if you change your mind on CPAP.  Work on healthy sleep regimen .  Follow up with Dr. Elsworth Soho or Kaylor Maiers NP in 4 months As needed         OSA (obstructive sleep apnea) Mild obstructive sleep apnea.  Patient only has mild symptoms.  We went over her sleep study results.  She is working on weight loss is down 20 pounds over the last 2 years.  We discussed treatment options including weight loss.  And CPAP.  She is not a candidate for oral appliance.  Patient was to hold off on CPAP at this time.  She is going to work on  positional sleep and weight loss.  - discussed how weight can impact sleep and risk for sleep disordered breathing - discussed options to assist with weight loss: combination of diet modification, cardiovascular and strength training exercises   - had an extensive discussion regarding the adverse health consequences related to untreated sleep disordered breathing - specifically discussed the risks for hypertension, coronary artery disease, cardiac dysrhythmias, cerebrovascular disease, and diabetes - lifestyle modification discussed   - discussed how sleep disruption can increase risk of accidents, particularly when driving - safe driving practices were discussed       Rexene Edison, NP 01/17/2022

## 2022-01-17 NOTE — Assessment & Plan Note (Signed)
Mild obstructive sleep apnea.  Patient only has mild symptoms.  We went over her sleep study results.  She is working on weight loss is down 20 pounds over the last 2 years.  We discussed treatment options including weight loss.  And CPAP.  She is not a candidate for oral appliance.  Patient was to hold off on CPAP at this time.  She is going to work on positional sleep and weight loss.  - discussed how weight can impact sleep and risk for sleep disordered breathing - discussed options to assist with weight loss: combination of diet modification, cardiovascular and strength training exercises   - had an extensive discussion regarding the adverse health consequences related to untreated sleep disordered breathing - specifically discussed the risks for hypertension, coronary artery disease, cardiac dysrhythmias, cerebrovascular disease, and diabetes - lifestyle modification discussed   - discussed how sleep disruption can increase risk of accidents, particularly when driving - safe driving practices were discussed

## 2022-01-24 ENCOUNTER — Other Ambulatory Visit: Payer: Self-pay | Admitting: Pulmonary Disease

## 2022-02-06 ENCOUNTER — Encounter: Payer: Self-pay | Admitting: Gastroenterology

## 2022-02-13 ENCOUNTER — Ambulatory Visit (AMBULATORY_SURGERY_CENTER): Payer: Medicare Other | Admitting: Gastroenterology

## 2022-02-13 ENCOUNTER — Encounter: Payer: Self-pay | Admitting: Gastroenterology

## 2022-02-13 VITALS — BP 134/58 | HR 50 | Temp 97.8°F | Resp 14 | Ht 63.0 in | Wt 181.0 lb

## 2022-02-13 DIAGNOSIS — R195 Other fecal abnormalities: Secondary | ICD-10-CM

## 2022-02-13 DIAGNOSIS — D125 Benign neoplasm of sigmoid colon: Secondary | ICD-10-CM

## 2022-02-13 DIAGNOSIS — D122 Benign neoplasm of ascending colon: Secondary | ICD-10-CM

## 2022-02-13 DIAGNOSIS — I2510 Atherosclerotic heart disease of native coronary artery without angina pectoris: Secondary | ICD-10-CM | POA: Diagnosis not present

## 2022-02-13 DIAGNOSIS — I509 Heart failure, unspecified: Secondary | ICD-10-CM | POA: Diagnosis not present

## 2022-02-13 DIAGNOSIS — Z1211 Encounter for screening for malignant neoplasm of colon: Secondary | ICD-10-CM | POA: Diagnosis not present

## 2022-02-13 DIAGNOSIS — J449 Chronic obstructive pulmonary disease, unspecified: Secondary | ICD-10-CM | POA: Diagnosis not present

## 2022-02-13 DIAGNOSIS — G4733 Obstructive sleep apnea (adult) (pediatric): Secondary | ICD-10-CM | POA: Diagnosis not present

## 2022-02-13 DIAGNOSIS — K635 Polyp of colon: Secondary | ICD-10-CM | POA: Diagnosis not present

## 2022-02-13 HISTORY — PX: COLONOSCOPY WITH PROPOFOL: SHX5780

## 2022-02-13 MED ORDER — SODIUM CHLORIDE 0.9 % IV SOLN: 500.0000 mL | Freq: Once | INTRAVENOUS | Status: DC

## 2022-02-13 NOTE — Op Note (Signed)
[facility] Patient Name: Emily Mitchell Procedure Date: 02/13/2022 7:59 AM MRN: [id number] Endoscopist: Jackquline Denmark , MD Age: 76 Referring MD:  Date of Birth: 07/04/1946 Gender: Female Account #: [id number] Procedure:                Colonoscopy Indications:              Positive Cologuard test Medicines:                Monitored Anesthesia Care Procedure:                Pre-Anesthesia Assessment:                           - Prior to the procedure, a History and Physical                            was performed, and patient medications and                            allergies were reviewed. The patient's tolerance of                            previous anesthesia was also reviewed. The risks                            and benefits of the procedure and the sedation                            options and risks were discussed with the patient.                            All questions were answered, and informed consent                            was obtained. Prior Anticoagulants: The patient has                            taken no previous anticoagulant or antiplatelet                            agents. ASA Grade Assessment: III - A patient with                            severe systemic disease. After reviewing the risks                            and benefits, the patient was deemed in                            satisfactory condition to undergo the procedure.                           After obtaining informed consent, the colonoscope  was passed under direct vision. Throughout the                            procedure, the patient's blood pressure, pulse, and                            oxygen saturations were monitored continuously. The                            Olympus PCF-H190DL ([id number]) Colonoscope was                            introduced through the anus and advanced to the the                            cecum, identified by appendiceal  orifice and                            ileocecal valve. The colonoscopy was performed                            without difficulty. The patient tolerated the                            procedure well. The quality of the bowel                            preparation was good. The ileocecal valve,                            appendiceal orifice, and rectum were photographed. Scope In: 8:04:14 AM Scope Out: 8:23:25 AM Scope Withdrawal Time: 0 hours 12 minutes 44 seconds  Total Procedure Duration: 0 hours 19 minutes 11 seconds  Findings:                 Three sessile polyps were found in the distal                            sigmoid colon, proximal ascending colon and mid                            ascending colon. The polyps were 4 to 6 mm in size.                            These polyps were removed with a cold snare.                            Resection and retrieval were complete.                           Multiple medium-mouthed diverticula were found in                            the sigmoid colon.  Non-bleeding internal hemorrhoids were found during                            retroflexion. The hemorrhoids were small and Grade                            I (internal hemorrhoids that do not prolapse).                           The exam was otherwise without abnormality on                            direct and retroflexion views. Complications:            No immediate complications. Estimated Blood Loss:     Estimated blood loss: none. Impression:               - Three 4 to 6 mm polyps in the distal sigmoid                            colon, in the proximal ascending colon and in the                            mid ascending colon, removed with a cold snare.                            Resected and retrieved.                           - Moderate sigmoid diverticulosis.                           - Non-bleeding internal hemorrhoids.                           - The  examination was otherwise normal on direct                            and retroflexion views. Recommendation:           - Patient has a contact number available for                            emergencies. The signs and symptoms of potential                            delayed complications were discussed with the                            patient. Return to normal activities tomorrow.                            Written discharge instructions were provided to the                            patient.                           -  High fiber diet.                           - Continue present medications.                           - Await pathology results.                           - Repeat colonoscopy for surveillance based on                            pathology results.                           - The findings and recommendations were discussed                            with the patient's family. Jackquline Denmark, MD 02/13/2022 8:28:34 AM This report has been signed electronically.

## 2022-02-13 NOTE — Patient Instructions (Signed)
Await pathology results.  Handout on polyps and diverticulosis given.  YOU HAD AN ENDOSCOPIC PROCEDURE TODAY AT THE [facility]:   Refer to the procedure report that was given to you for any specific questions about what was found during the examination.  If the procedure report does not answer your questions, please call your gastroenterologist to clarify.  If you requested that your care partner not be given the details of your procedure findings, then the procedure report has been included in a sealed envelope for you to review at your convenience later.  YOU SHOULD EXPECT: Some feelings of bloating in the abdomen. Passage of more gas than usual.  Walking can help get rid of the air that was put into your GI tract during the procedure and reduce the bloating. If you had a lower endoscopy (such as a colonoscopy or flexible sigmoidoscopy) you may notice spotting of blood in your stool or on the toilet paper. If you underwent a bowel prep for your procedure, you may not have a normal bowel movement for a few days.  Please Note:  You might notice some irritation and congestion in your nose or some drainage.  This is from the oxygen used during your procedure.  There is no need for concern and it should clear up in a day or so.  SYMPTOMS TO REPORT IMMEDIATELY:  Following lower endoscopy (colonoscopy or flexible sigmoidoscopy):  Excessive amounts of blood in the stool  Significant tenderness or worsening of abdominal pains  Swelling of the abdomen that is new, acute  Fever of 100F or higher  For urgent or emergent issues, a gastroenterologist can be reached at any hour by calling [phone]. Do not use MyChart messaging for urgent concerns.    DIET:  We do recommend a small meal at first, but then you may proceed to your regular diet.  Drink plenty of fluids but you should avoid alcoholic beverages for 24 hours.  ACTIVITY:  You should plan to take it easy for the rest of today  and you should NOT DRIVE or use heavy machinery until tomorrow (because of the sedation medicines used during the test).    FOLLOW UP: Our staff will call the number listed on your records the next business day following your procedure.  We will call around 7:15- 8:00 am to check on you and address any questions or concerns that you may have regarding the information given to you following your procedure. If we do not reach you, we will leave a message.  If you develop any symptoms (ie: fever, flu-like symptoms, shortness of breath, cough etc.) before then, please call [phone].  If you test positive for Covid 19 in the 2 weeks post procedure, please call and report this information to us.    If any biopsies were taken you will be contacted by phone or by letter within the next 1-3 weeks.  Please call us at [phone] if you have not heard about the biopsies in 3 weeks.    SIGNATURES/CONFIDENTIALITY: You and/or your care partner have signed paperwork which will be entered into your electronic medical record.  These signatures attest to the fact that that the information above on your After Visit Summary has been reviewed and is understood.  Full responsibility of the confidentiality of this discharge information lies with you and/or your care-partner.  

## 2022-02-13 NOTE — Progress Notes (Signed)
Pt's states no medical or surgical changes since previsit or office visit. 

## 2022-02-13 NOTE — Progress Notes (Signed)
Report given to PACU, vss 

## 2022-02-13 NOTE — Progress Notes (Signed)
Chief Complaint:   Referring Provider:  Mosie Lukes, MD      ASSESSMENT AND PLAN;   #1. + cologuard    Plan: -Colon after cardio clearence, after hold plavix 5 days   Discussed risks & benefits of colonoscopy. Risks including rare perforation req laparotomy, bleeding after bx/polypectomy req blood transfusion, rarely missing neoplasms, risks of anesthesia/sedation, rare risk of damage to internal organs. Benefits outweigh the risks. Patient agrees to proceed. All the questions were answered. Pt consents to proceed.  HPI:    Emily Mitchell is a 76 y.o. female  With H/O COPD, CAD s/p DES 2019 on Plavix, CKD, HLD, HTN  + cologuard  No nausea, vomiting, heartburn (on nexium), regurgitation, odynophagia or dysphagia.  No significant diarrhea or constipation.  No melena or hematochezia. No unintentional weight loss. No abdominal pain.   Past GI procedures: Colon 2010- woke up-had a bad experience.  CT AP 01/2021 without contrast. Cholelithiasis. Sigmoid diverticulosis without evidence of diverticulitis. No acute intra-abdominal or intrapelvic abnormalities. Spinal stenosis L4-L5. Aortic Atherosclerosis (ICD10-I70.0).  Past Medical History:  Diagnosis Date   Acid reflux    Anemia 07/05/2014   Anxiety and depression 12/14/2008   Qualifier: Diagnosis of  By: [name] LPN, Almyra Free     Arthritis    "fingers, toes, knees, joints" (07/18/2018)   Asthma    Atherosclerotic heart disease of native coronary artery without angina pectoris 07/19/2018   Atypical chest pain 05/23/2015   Back pain 02/02/2019   Breast mass, left 04/28/2019   CAD in native artery 07/19/2018   Candidal skin infection 02/28/2018   Carotid artery disease ([place]) 06/16/2015   S/p CEA    Carotid artery occlusion    a. R carotid occluded, 09-32% LICA.   Chest pain 07/18/2018   CHF (congestive heart failure) (HCC)    Chicken pox as a child   Chronic kidney disease    Bright's Disease at age 67 , stage  4   Chronic obstructive pulmonary disease ([place]) 12/14/2008   Formatting of this note might be different from the original. Overview:  Spirometry 05/12/2013: FEV1 72% predicted FEV1 FVC ratio 73 predicted FEF 25 75  43% predicted  Last Assessment & Plan:  OK to stop taking advair Use albuterol as needed only Check oxygen levels during sleep - we will call you with results Call if symptoms worse   Chronic rhinitis 06/14/2018   CKD (chronic kidney disease), stage IV ([place])    Follows with [facility], Dr Alonza Smoker    Congestive heart failure ([place]) 11/30/2015   Formatting of this note might be different from the original. Last Assessment & Plan:  No recent exacerbation   Constipation 12/17/2008   Qualifier: Diagnosis of  By: Westly Pam    COPD with asthma ([place]) 11/06/2017   Essential hypertension 10/18/2015   Formatting of this note might be different from the original. Last Assessment & Plan:  poorlycontrolled and under a great deal of stress, add Metoprolol XL 25 mg daily. Encouraged heart healthy diet such as the DASH diet and exercise as tolerated.   Essential tremor 11/05/2013   Exudative age-related macular degeneration of right eye with active choroidal neovascularization ([place]) 08/26/2015   Gastro-esophageal reflux disease without esophagitis 12/14/2008   Formatting of this note might be different from the original. Formatting of this note might be different from the original. Formatting of this note might be different from the original. Overview:  Qualifier: Diagnosis of  By:  [name] LPN, Almyra Free   Last Assessment & Plan:  Avoid offending foods, start probiotics. Do not eat large meals in late evening and consider raising head of bed. Last Assessment   GERD (gastroesophageal reflux disease)    H. pylori infection 10/19/2013   H/O multiple allergies 52/84/1324   Helicobacter pylori (H. pylori) as the cause of diseases classified elsewhere 10/19/2013   Formatting of this note  might be different from the original. Last Assessment & Plan:  Tetracycline, Pepto Bismol, Flagyl and Omeprazole.   Hepatitis 1970s   "don't know for sure which one it was; think it was A" (07/18/2018)   History of food allergy 08/14/2018   History of pulmonary embolism 07/17/2018   Hoarseness 03/09/2019   Hypercalcemia 03/09/2019   Hyperglycemia 03/14/2016   Hyperlipidemia, mixed 05/26/2013   Crestor caused N/V Did not tolerate Lipitor    Hypertension    Knee pain, bilateral 12/17/2012   Left hip pain 05/23/2015   Low back pain 09/19/2015   Lower urinary tract infectious disease 10/19/2013   Lymphadenopathy 10/18/2017   Macular degeneration of both eyes 12/16/2015   Right eye is wet Left Eye is dry Shots every 11 to 12 weeks in right eye at opthamologist office   Medicare annual wellness visit, subsequent 02/21/2015   Mixed anxiety depressive disorder 12/14/2008   Formatting of this note might be different from the original. Overview:  Qualifier: Diagnosis of  By: [name] LPN, Almyra Free  Patient had increased  Tremors on Bupropion  Last Assessment & Plan:  Is struggling with the stress of her 32 year old granddaughter whom she raised moving back with her own mother, the patient is estranged from her daughter so she is very upset. Increase Citalopram 40 mg daily    Mumps 76 yrs old   Neck pain 04/22/2017   Numbness of finger 12/17/2012   Occlusion and stenosis of carotid artery without mention of cerebral infarction 12/17/2012   OSA (obstructive sleep apnea) 12/14/2008   PSG  06/2013-AHI 6/h 08/2011 ([facility]) AHI 4/h No longer needs CPAP    Preventative health care 03/14/2016   Pulmonary emboli ([place]) 05/26/2013   Recurrent infection of skin 07/19/2021   Renal insufficiency 07/17/2018   Rheumatoid factor positive 09/27/2021   Right shoulder pain 10/18/2017   Sensation of foreign body in larynx 04/08/2019   Skin lesion of back 03/27/2021   Sleep apnea    "don't use CPAP anymore; dr said I  didn't have to" (07/18/2018)   Static tremor 11/05/2013   Formatting of this note might be different from the original. Last Assessment & Plan:  Follows with [facility]   Statin intolerance 09/29/2019   Stroke [facility])    "been told I've had some strokes; didn't know I'd had them" (07/18/2018)   Thyroid disease    Vitamin D deficiency 03/03/2018   Vitreomacular adhesion of both eyes 01/07/2019   Formatting of this note might be different from the original. no traction    Past Surgical History:  Procedure Laterality Date   APPENDECTOMY  1984   CARDIAC CATHETERIZATION  ~ 2006   CAROTID ANGIOGRAPHY Left 08/20/2018   Procedure: CAROTID ANGIOGRAPHY;  Surgeon: Serafina Mitchell, MD;  Location: [facility];  Service: Cardiovascular;  Laterality: Left;   CAROTID ENDARTERECTOMY Right 05/10/2007   cea   CATARACT EXTRACTION Left    CATARACT EXTRACTION W/ INTRAOCULAR LENS  IMPLANT, BILATERAL Bilateral    COLONOSCOPY  09/25/2008   normal   COLONOSCOPY WITH PROPOFOL  02/13/2022   Jackquline Denmark at [place] WITH STENT PLACEMENT  07/18/2018   CORONARY STENT INTERVENTION N/A 07/18/2018   Procedure: CORONARY STENT INTERVENTION;  Surgeon: Lorretta Harp, MD;  Location: [facility];  Service: Cardiovascular;  Laterality: N/A;   INTRAVASCULAR PRESSURE WIRE/FFR STUDY N/A 07/18/2018   Procedure: INTRAVASCULAR PRESSURE WIRE/FFR STUDY;  Surgeon: Lorretta Harp, MD;  Location: [facility];  Service: Cardiovascular;  Laterality: N/A;   JOINT REPLACEMENT     LEFT HEART CATH AND CORONARY ANGIOGRAPHY N/A 07/18/2018   Procedure: LEFT HEART CATH AND CORONARY ANGIOGRAPHY;  Surgeon: Lorretta Harp, MD;  Location: [facility];  Service: Cardiovascular;  Laterality: N/A;   MULTIPLE TOOTH EXTRACTIONS     TOTAL KNEE ARTHROPLASTY Bilateral 2005 - 2011   right - left   VAGINAL HYSTERECTOMY  1984   WISDOM TOOTH EXTRACTION      Family History  Problem Relation Age  of Onset   Stroke Mother        mini stroke   Kidney disease Mother    Heart failure Mother    Hypertension Mother    Diabetes Sister        type 2   Kidney disease Sister    Allergic rhinitis Sister    Osteoarthritis Sister    Hyperlipidemia Sister    Heart attack Maternal Grandfather    Colon cancer Neg Hx    Stomach cancer Neg Hx    Rectal cancer Neg Hx     Social History   Tobacco Use   Smoking status: Former    Packs/day: 2.00    Years: 30.00    Total pack years: 60.00    Types: Cigarettes    Quit date: 12/03/1994    Years since quitting: 27.2   Smokeless tobacco: Never   Tobacco comments:    Quit smoking 26 years ago  Vaping Use   Vaping Use: Never used  Substance Use Topics   Alcohol use: Yes    Comment: special occasions   Drug use: Never    Current Outpatient Medications  Medication Sig Dispense Refill   aspirin EC 81 MG tablet Take 1 tablet (81 mg total) by mouth daily. 90 tablet 3   atorvastatin (LIPITOR) 10 MG tablet TAKE 1 TABLET BY MOUTH EVERY DAY 90 tablet 1   BREZTRI AEROSPHERE 160-9-4.8 MCG/ACT AERO INHALE 2 PUFFS TWICE DAILY IN MORNING AND IN THE EVENING 10.7 each 4   Calcium Citrate-Vitamin D 200-250 MG-UNIT TABS Take 1 tablet by mouth daily.     Cholecalciferol 25 MCG (1000 UT) tablet Take 1,000 Units by mouth daily.     Choline Fenofibrate (FENOFIBRIC ACID) 135 MG CPDR TAKE 1 CAPSULE BY MOUTH EVERY DAY 90 capsule 1   clonazePAM (KLONOPIN) 1 MG tablet Take 1 tablet (1 mg total) by mouth 3 (three) times daily as needed. for anxiety 90 tablet 1   cyanocobalamin 100 MCG tablet Take 500 mcg by mouth daily.     esomeprazole (NEXIUM) 40 MG capsule Take 40 mg by mouth daily.     ezetimibe (ZETIA) 10 MG tablet Take 1 tablet (10 mg total) by mouth daily. 90 tablet 1   famotidine (PEPCID) 40 MG tablet Take 1 tablet (40 mg total) by mouth daily. 90 tablet 1   FLUoxetine (PROZAC) 20 MG tablet Take 1 tablet (20 mg total) by mouth daily. 90 tablet 1    gabapentin (NEURONTIN) 100 MG capsule TAKE 2 CAPSULES BY  MOUTH AT BEDTIME 180 capsule 1   levothyroxine (SYNTHROID) 100 MCG tablet Take 1 tablet (100 mcg total) by mouth daily. 90 tablet 1   montelukast (SINGULAIR) 10 MG tablet Take 10 mg by mouth at bedtime as needed for allergies.     NON FORMULARY Take 1 tablet by mouth daily. Digestive advantage.     Omega-3 Fatty Acids (FISH OIL PO) Take 1 capsule by mouth daily.     Probiotic Product (VSL#3) CAPS Take 1 capsule by mouth daily.     acetaminophen (TYLENOL) 500 MG tablet Take 500 mg by mouth as needed for headache.     albuterol (VENTOLIN HFA) 108 (90 Base) MCG/ACT inhaler Inhale 2 puffs into the lungs every 6 (six) hours as needed for wheezing or shortness of breath. 18 g 3   traMADol (ULTRAM) 50 MG tablet Take 1 tablet (50 mg total) by mouth every 8 (eight) hours as needed for moderate pain or severe pain. 90 tablet 0   Current Facility-Administered Medications  Medication Dose Route Frequency Provider Last Rate Last Admin   0.9 %  sodium chloride infusion  500 mL Intravenous Once Jackquline Denmark, MD        Allergies  Allergen Reactions   Niaspan Durene Cal Er] Nausea And Vomiting and Swelling    Swelling in mouth   Pantoprazole     Mouth sores   Sulfonamide Derivatives Hives   Bupropion Other (See Comments)    Uncontrollable shakes   Nitroglycerin     NOT allergic - cardiology has recommended she NOT use this due to h/o severe carotid disease as it may decrease cerebral perfusion   Penicillins Hives    DID THE REACTION INVOLVE: Swelling of the face/tongue/throat, SOB, or low BP? Yes Sudden or severe rash/hives, skin peeling, or the inside of the mouth or nose? Unknown Did it require medical treatment? Unknown When did it last happen?   [age]  If all above answers are "NO", may proceed with cephalosporin use.   Statins    Apple Juice Rash   Banana Rash    Review of Systems:  Constitutional: Denies fever, chills, diaphoresis,  appetite change and fatigue.  HEENT: Denies photophobia, eye pain, redness, hearing loss, ear pain, congestion, sore throat, rhinorrhea, sneezing, mouth sores, neck pain, neck stiffness and tinnitus.   Respiratory: Denies SOB, DOE, cough, chest tightness,  and wheezing.   Cardiovascular: Denies chest pain, palpitations and leg swelling.  Genitourinary: Denies dysuria, urgency, frequency, hematuria, flank pain and difficulty urinating.  Musculoskeletal: Denies myalgias, back pain, joint swelling, arthralgias and gait problem.  Skin: No rash.  Neurological: Denies dizziness, seizures, syncope, weakness, light-headedness, numbness and headaches.  Hematological: Denies adenopathy. Easy bruising, personal or family bleeding history  Psychiatric/Behavioral: has anxiety or depression     Physical Exam:    BP (!) 184/70   Pulse (!) 58   Temp 97.8 F (36.6 C) (Temporal)   Ht '5\' 3"'$  (1.6 m)   Wt 181 lb (82.1 kg)   SpO2 98%   BMI 32.06 kg/m  Wt Readings from Last 3 Encounters:  02/13/22 181 lb (82.1 kg)  01/17/22 176 lb 9.6 oz (80.1 kg)  12/15/21 180 lb 1.3 oz (81.7 kg)   Constitutional:  Well-developed, in no acute distress. Psychiatric: Normal mood and affect. Behavior is normal. HEENT: Pupils normal.  Conjunctivae are normal. No scleral icterus. Neck supple.  Cardiovascular: Normal rate, regular rhythm. No edema Pulmonary/chest: Effort normal and breath sounds normal. No wheezing, rales or rhonchi. Abdominal:  Soft, nondistended. Nontender. Bowel sounds active throughout. There are no masses palpable. No hepatomegaly. Rectal: Deferred Neurological: Alert and oriented to person place and time. Skin: Skin is warm and dry. No rashes noted.  Data Reviewed: I have personally reviewed following labs and imaging studies  CBC:    Latest Ref Rng & Units 01/09/2022   10:46 AM 10/06/2021    2:32 PM 03/24/2021    2:56 PM  CBC  WBC 4.0 - 10.5 K/uL 6.3  6.7  7.6   Hemoglobin 12.0 - 15.0 g/dL  12.3  13.0  12.6   Hematocrit 36.0 - 46.0 % 37.4  39.5  38.6   Platelets 150.0 - 400.0 K/uL 232.0  251.0  295.0     CMP:    Latest Ref Rng & Units 01/09/2022   10:49 AM 10/31/2021   12:00 AM 10/06/2021    2:32 PM  CMP  Glucose 70 - 99 mg/dL 76   83   BUN 6 - 23 mg/dL 32  28     31   Creatinine 0.40 - 1.20 mg/dL 1.48  1.6     1.62   Sodium 135 - 145 mEq/L 142  140     142   Potassium 3.5 - 5.1 mEq/L 4.1  4.8     4.6   Chloride 96 - 112 mEq/L 106  107     106   CO2 19 - 32 mEq/L '28  29     29   '$ Calcium 8.4 - 10.5 mg/dL 8.6 - 10.4 mg/dL 9.5    9.6  9.8     9.7   Total Protein 6.0 - 8.3 g/dL 5.9   6.2   Total Bilirubin 0.2 - 1.2 mg/dL 0.5   0.4   Alkaline Phos 39 - 117 U/L 46  64     54   AST 0 - 37 U/L '19  24     21   '$ ALT 0 - 35 U/L '16  18     17      '$ This result is from an external source.   Multiple values from one day are sorted in reverse-chronological order    GFR: CrCl cannot be calculated (Patient's most recent lab result is older than the maximum 21 days allowed.). Liver Function Tests: No results for input(s): "AST", "ALT", "ALKPHOS", "BILITOT", "PROT", "ALBUMIN" in the last 168 hours. No results for input(s): "LIPASE", "AMYLASE" in the last 168 hours. No results for input(s): "AMMONIA" in the last 168 hours. Coagulation Profile: No results for input(s): "INR", "PROTIME" in the last 168 hours. HbA1C: No results for input(s): "HGBA1C" in the last 72 hours. Lipid Profile: No results for input(s): "CHOL", "HDL", "LDLCALC", "TRIG", "CHOLHDL", "LDLDIRECT" in the last 72 hours. Thyroid Function Tests: No results for input(s): "TSH", "T4TOTAL", "FREET4", "T3FREE", "THYROIDAB" in the last 72 hours. Anemia Panel: No results for input(s): "VITAMINB12", "FOLATE", "FERRITIN", "TIBC", "IRON", "RETICCTPCT" in the last 72 hours.  No results found for this or any previous visit (from the past 240 hour(s)).    Radiology Studies: No results found.    Carmell Austria, MD 02/13/2022,  7:58 AM  Cc: Mosie Lukes, MD

## 2022-02-13 NOTE — Progress Notes (Signed)
Called to room to assist during endoscopic procedure.  Patient ID and intended procedure confirmed with present staff. Received instructions for my participation in the procedure from the performing physician.  

## 2022-02-14 ENCOUNTER — Telehealth: Payer: Self-pay

## 2022-02-14 NOTE — Telephone Encounter (Signed)
Left message

## 2022-02-19 ENCOUNTER — Encounter: Payer: Self-pay | Admitting: Gastroenterology

## 2022-02-27 ENCOUNTER — Other Ambulatory Visit: Payer: Self-pay | Admitting: Family Medicine

## 2022-02-27 NOTE — Telephone Encounter (Signed)
Last OV--10/06/2021 Next scheduled appt. 04/11/2022 Last RF--#90 with 1 refill on 09/23/2021 12/30/19--UDS/C[facility] updated

## 2022-03-09 ENCOUNTER — Other Ambulatory Visit: Payer: Self-pay | Admitting: Family Medicine

## 2022-03-14 DIAGNOSIS — N1832 Chronic kidney disease, stage 3b: Secondary | ICD-10-CM | POA: Diagnosis not present

## 2022-03-14 DIAGNOSIS — N2581 Secondary hyperparathyroidism of renal origin: Secondary | ICD-10-CM | POA: Diagnosis not present

## 2022-03-14 DIAGNOSIS — N189 Chronic kidney disease, unspecified: Secondary | ICD-10-CM | POA: Diagnosis not present

## 2022-03-14 DIAGNOSIS — D631 Anemia in chronic kidney disease: Secondary | ICD-10-CM | POA: Diagnosis not present

## 2022-03-14 DIAGNOSIS — I129 Hypertensive chronic kidney disease with stage 1 through stage 4 chronic kidney disease, or unspecified chronic kidney disease: Secondary | ICD-10-CM | POA: Diagnosis not present

## 2022-03-14 DIAGNOSIS — N390 Urinary tract infection, site not specified: Secondary | ICD-10-CM | POA: Diagnosis not present

## 2022-03-14 DIAGNOSIS — I509 Heart failure, unspecified: Secondary | ICD-10-CM | POA: Diagnosis not present

## 2022-03-14 DIAGNOSIS — R251 Tremor, unspecified: Secondary | ICD-10-CM | POA: Diagnosis not present

## 2022-03-14 DIAGNOSIS — E785 Hyperlipidemia, unspecified: Secondary | ICD-10-CM | POA: Diagnosis not present

## 2022-03-14 DIAGNOSIS — I6529 Occlusion and stenosis of unspecified carotid artery: Secondary | ICD-10-CM | POA: Diagnosis not present

## 2022-03-20 ENCOUNTER — Telehealth: Payer: Self-pay | Admitting: Family Medicine

## 2022-03-20 NOTE — Telephone Encounter (Addendum)
Pt called stating that her asthma and COPD is giving her issues where she is congested for the past 3 weeks. Pt would like to know if there is any OTC or Rx treatment that Dr. Charlett Blake could recommend/prescribe to treat this. Pt did take a COVID test and was negative.

## 2022-03-20 NOTE — Telephone Encounter (Signed)
Pt aware.

## 2022-03-27 ENCOUNTER — Other Ambulatory Visit: Payer: Self-pay | Admitting: Family Medicine

## 2022-03-27 DIAGNOSIS — E785 Hyperlipidemia, unspecified: Secondary | ICD-10-CM

## 2022-04-05 DIAGNOSIS — H353212 Exudative age-related macular degeneration, right eye, with inactive choroidal neovascularization: Secondary | ICD-10-CM | POA: Diagnosis not present

## 2022-04-05 DIAGNOSIS — H353221 Exudative age-related macular degeneration, left eye, with active choroidal neovascularization: Secondary | ICD-10-CM | POA: Diagnosis not present

## 2022-04-05 DIAGNOSIS — H35363 Drusen (degenerative) of macula, bilateral: Secondary | ICD-10-CM | POA: Diagnosis not present

## 2022-04-05 DIAGNOSIS — H43823 Vitreomacular adhesion, bilateral: Secondary | ICD-10-CM | POA: Diagnosis not present

## 2022-04-05 DIAGNOSIS — Z961 Presence of intraocular lens: Secondary | ICD-10-CM | POA: Diagnosis not present

## 2022-04-05 DIAGNOSIS — H3554 Dystrophies primarily involving the retinal pigment epithelium: Secondary | ICD-10-CM | POA: Diagnosis not present

## 2022-04-11 ENCOUNTER — Ambulatory Visit: Payer: Medicare Other | Admitting: Family Medicine

## 2022-04-11 ENCOUNTER — Encounter: Payer: Self-pay | Admitting: Family

## 2022-04-11 ENCOUNTER — Ambulatory Visit (INDEPENDENT_AMBULATORY_CARE_PROVIDER_SITE_OTHER): Payer: Medicare Other | Admitting: Family

## 2022-04-11 ENCOUNTER — Ambulatory Visit (HOSPITAL_BASED_OUTPATIENT_CLINIC_OR_DEPARTMENT_OTHER)
Admission: RE | Admit: 2022-04-11 | Discharge: 2022-04-11 | Disposition: A | Discharge status: Home / Self Care | Payer: Medicare Other | Source: Ambulatory Visit | Attending: Family | Admitting: Family

## 2022-04-11 VITALS — BP 138/80 | HR 73 | Temp 98.7°F | Ht 62.0 in | Wt 177.0 lb

## 2022-04-11 DIAGNOSIS — R062 Wheezing: Secondary | ICD-10-CM

## 2022-04-11 DIAGNOSIS — Z23 Encounter for immunization: Secondary | ICD-10-CM

## 2022-04-11 DIAGNOSIS — I10 Essential (primary) hypertension: Secondary | ICD-10-CM | POA: Diagnosis not present

## 2022-04-11 DIAGNOSIS — R059 Cough, unspecified: Secondary | ICD-10-CM | POA: Diagnosis not present

## 2022-04-11 DIAGNOSIS — R1310 Dysphagia, unspecified: Secondary | ICD-10-CM | POA: Insufficient documentation

## 2022-04-11 DIAGNOSIS — E079 Disorder of thyroid, unspecified: Secondary | ICD-10-CM | POA: Diagnosis not present

## 2022-04-11 DIAGNOSIS — I509 Heart failure, unspecified: Secondary | ICD-10-CM | POA: Diagnosis not present

## 2022-04-11 LAB — COMPREHENSIVE METABOLIC PANEL
ALT: 17 U/L (ref 0–35)
AST: 23 U/L (ref 0–37)
Albumin: 3.8 g/dL (ref 3.5–5.2)
Alkaline Phosphatase: 47 U/L (ref 39–117)
BUN: 24 mg/dL — ABNORMAL HIGH (ref 6–23)
CO2: 29 mEq/L (ref 19–32)
Calcium: 9.4 mg/dL (ref 8.4–10.5)
Chloride: 106 mEq/L (ref 96–112)
Creatinine, Ser: 1.51 mg/dL — ABNORMAL HIGH (ref 0.40–1.20)
GFR: 33.94 mL/min — ABNORMAL LOW (ref >60.00)
Glucose, Bld: 70 mg/dL (ref 70–99)
Potassium: 4.5 mEq/L (ref 3.5–5.1)
Sodium: 142 mEq/L (ref 135–145)
Total Bilirubin: 0.5 mg/dL (ref 0.2–1.2)
Total Protein: 6.5 g/dL (ref 6.0–8.3)

## 2022-04-11 LAB — CBC WITH DIFFERENTIAL/PLATELET
Basophils Absolute: 0.1 10*3/uL (ref 0.0–0.1)
Basophils Relative: 1.1 % (ref 0.0–3.0)
Eosinophils Absolute: 0.3 10*3/uL (ref 0.0–0.7)
Eosinophils Relative: 4.6 % (ref 0.0–5.0)
HCT: 38.4 % (ref 36.0–46.0)
Hemoglobin: 12.5 g/dL (ref 12.0–15.0)
Lymphocytes Relative: 27.3 % (ref 12.0–46.0)
Lymphs Abs: 1.6 10*3/uL (ref 0.7–4.0)
MCHC: 32.6 g/dL (ref 30.0–36.0)
MCV: 90.5 fl (ref 78.0–100.0)
Monocytes Absolute: 0.6 10*3/uL (ref 0.1–1.0)
Monocytes Relative: 9.6 % (ref 3.0–12.0)
Neutro Abs: 3.3 10*3/uL (ref 1.4–7.7)
Neutrophils Relative %: 57.4 % (ref 43.0–77.0)
Platelets: 242 10*3/uL (ref 150.0–400.0)
RBC: 4.24 Mil/uL (ref 3.87–5.11)
RDW: 15.3 % (ref 11.5–15.5)
WBC: 5.8 10*3/uL (ref 4.0–10.5)

## 2022-04-11 LAB — TSH: TSH: 0.21 u[IU]/mL — ABNORMAL LOW (ref 0.35–5.50)

## 2022-04-11 MED ORDER — ESOMEPRAZOLE MAGNESIUM 40 MG PO CPDR: 40.0000 mg | DELAYED_RELEASE_CAPSULE | Freq: Every day | ORAL | 0 refills | Status: DC

## 2022-04-11 MED ORDER — ESOMEPRAZOLE MAGNESIUM 40 MG PO PACK
40.0000 mg | PACK | Freq: Every day | ORAL | 1 refills | Status: DC
Start: 2022-04-11 — End: 2022-04-11

## 2022-04-11 NOTE — Progress Notes (Signed)
Emily Mitchell is a 76 y.o. female with the following history as recorded in EpicCare:  Patient Active Problem List   Diagnosis Date Noted   GERD (gastroesophageal reflux disease) 12/13/2021   Rheumatoid factor positive 09/27/2021   Recurrent infection of skin 07/19/2021   Skin lesion of back 03/27/2021   Asthma    Carotid artery occlusion    Chicken pox    Hepatitis    Hypertension    Mumps    Sleep apnea    Stroke [facility])    CHF (congestive heart failure) (HCC)    Statin intolerance 09/29/2019   Breast mass, left 04/28/2019   Sensation of foreign body in larynx 04/08/2019   Hypercalcemia 03/09/2019   Hoarseness 03/09/2019   Back pain 02/02/2019   Vitreomacular adhesion of both eyes 01/07/2019   History of food allergy 08/14/2018   CAD in native artery 07/19/2018   Atherosclerotic heart disease of native coronary artery without angina pectoris 07/19/2018   Chest pain 07/18/2018   History of pulmonary embolism 07/17/2018   Renal insufficiency 07/17/2018   H/O multiple allergies 07/14/2018   Chronic rhinitis 06/14/2018   Vitamin D deficiency 03/03/2018   Candidal skin infection 02/28/2018   COPD with asthma ([place]) 11/06/2017   Lymphadenopathy 10/18/2017   Right shoulder pain 10/18/2017   Neck pain 04/22/2017   Hyperglycemia 03/14/2016   Preventative health care 03/14/2016   Macular degeneration of both eyes 12/16/2015   Arthritis 11/30/2015   Congestive heart failure ([place]) 11/30/2015   Chronic kidney disease 11/30/2015   Essential hypertension 10/18/2015   Low back pain 09/19/2015   Exudative age-related macular degeneration of right eye with active choroidal neovascularization ([place]) 08/26/2015   Carotid artery disease ([place]) 06/16/2015   Left hip pain 05/23/2015   Atypical chest pain 05/23/2015   Medicare annual wellness visit, subsequent 02/21/2015   Anemia 07/05/2014   Essential tremor 11/05/2013   Static tremor 11/05/2013   Lower urinary tract infectious disease  10/19/2013   H. pylori infection 54/65/6812   Helicobacter pylori (H. pylori) as the cause of diseases classified elsewhere 10/19/2013   Hyperlipidemia, mixed 05/26/2013   Pulmonary emboli ([place]) 05/26/2013   Acid reflux    CKD (chronic kidney disease), stage IV (HCC)    Thyroid disease    Occlusion and stenosis of carotid artery without mention of cerebral infarction 12/17/2012   Knee pain, bilateral 12/17/2012   Numbness of finger 12/17/2012   Constipation 12/17/2008   Anxiety and depression 12/14/2008   OSA (obstructive sleep apnea) 12/14/2008   Chronic obstructive pulmonary disease ([place]) 12/14/2008   Mixed anxiety depressive disorder 12/14/2008   Gastro-esophageal reflux disease without esophagitis 12/14/2008    Current Outpatient Medications  Medication Sig Dispense Refill   acetaminophen (TYLENOL) 500 MG tablet Take 500 mg by mouth as needed for headache.     albuterol (VENTOLIN HFA) 108 (90 Base) MCG/ACT inhaler Inhale 2 puffs into the lungs every 6 (six) hours as needed for wheezing or shortness of breath. 18 g 3   aspirin EC 81 MG tablet Take 1 tablet (81 mg total) by mouth daily. 90 tablet 3   BREZTRI AEROSPHERE 160-9-4.8 MCG/ACT AERO INHALE 2 PUFFS TWICE DAILY IN MORNING AND IN THE EVENING 10.7 each 4   Calcium Citrate-Vitamin D 200-250 MG-UNIT TABS Take 1 tablet by mouth daily.     Cholecalciferol 25 MCG (1000 UT) tablet Take 1,000 Units by mouth daily.     Choline Fenofibrate (FENOFIBRIC ACID) 135 MG CPDR TAKE 1 CAPSULE  BY MOUTH EVERY DAY 90 capsule 1   clonazePAM (KLONOPIN) 1 MG tablet TAKE 1 TABLET (1 MG TOTAL) BY MOUTH 3 (THREE) TIMES DAILY AS NEEDED. FOR ANXIETY 90 tablet 0   cyanocobalamin 100 MCG tablet Take 500 mcg by mouth daily.     esomeprazole (NEXIUM) 40 MG capsule Take 1 capsule (40 mg total) by mouth daily. 30 capsule 0   ezetimibe (ZETIA) 10 MG tablet Take 1 tablet (10 mg total) by mouth daily. 90 tablet 1   famotidine (PEPCID) 40 MG tablet TAKE 1 TABLET BY  MOUTH EVERY DAY 90 tablet 1   FLUoxetine (PROZAC) 20 MG tablet Take 1 tablet (20 mg total) by mouth daily. 90 tablet 1   gabapentin (NEURONTIN) 100 MG capsule TAKE 2 CAPSULES BY MOUTH AT BEDTIME 180 capsule 1   levothyroxine (SYNTHROID) 100 MCG tablet Take 1 tablet (100 mcg total) by mouth daily. 90 tablet 1   montelukast (SINGULAIR) 10 MG tablet TAKE 1 TABLET BY MOUTH AT BEDTIME AS NEEDED 90 tablet 1   NON FORMULARY Take 1 tablet by mouth daily. Digestive advantage.     Omega-3 Fatty Acids (FISH OIL PO) Take 1 capsule by mouth daily.     Probiotic Product (VSL#3) CAPS Take 1 capsule by mouth daily.     traMADol (ULTRAM) 50 MG tablet Take 1 tablet (50 mg total) by mouth every 8 (eight) hours as needed for moderate pain or severe pain. 90 tablet 0   No current facility-administered medications for this visit.    Allergies: Niaspan [niacin er], Pantoprazole, Sulfonamide derivatives, Bupropion, Nitroglycerin, Penicillins, Statins, Apple juice, and Banana  Past Medical History:  Diagnosis Date   Acid reflux    Anemia 07/05/2014   Anxiety and depression 12/14/2008   Qualifier: Diagnosis of  By: [name] LPN, Almyra Free     Arthritis    "fingers, toes, knees, joints" (07/18/2018)   Asthma    Atherosclerotic heart disease of native coronary artery without angina pectoris 07/19/2018   Atypical chest pain 05/23/2015   Back pain 02/02/2019   Breast mass, left 04/28/2019   CAD in native artery 07/19/2018   Candidal skin infection 02/28/2018   Carotid artery disease ([place]) 06/16/2015   S/p CEA    Carotid artery occlusion    a. R carotid occluded, 17-79% LICA.   Chest pain 07/18/2018   CHF (congestive heart failure) (HCC)    Chicken pox as a child   Chronic kidney disease    Bright's Disease at age 64 , stage 4   Chronic obstructive pulmonary disease ([place]) 12/14/2008   Formatting of this note might be different from the original. Overview:  Spirometry 05/12/2013: FEV1 72% predicted FEV1 FVC ratio 73  predicted FEF 25 75  43% predicted  Last Assessment & Plan:  OK to stop taking advair Use albuterol as needed only Check oxygen levels during sleep - we will call you with results Call if symptoms worse   Chronic rhinitis 06/14/2018   CKD (chronic kidney disease), stage IV ([place])    Follows with [facility], Dr Alonza Smoker    Congestive heart failure ([place]) 11/30/2015   Formatting of this note might be different from the original. Last Assessment & Plan:  No recent exacerbation   Constipation 12/17/2008   Qualifier: Diagnosis of  By: Westly Pam    COPD with asthma ([place]) 11/06/2017   Essential hypertension 10/18/2015   Formatting of this note might be different from the original. Last Assessment & Plan:  poorlycontrolled and under a great deal of stress, add Metoprolol XL 25 mg daily. Encouraged heart healthy diet such as the DASH diet and exercise as tolerated.   Essential tremor 11/05/2013   Exudative age-related macular degeneration of right eye with active choroidal neovascularization ([place]) 08/26/2015   Gastro-esophageal reflux disease without esophagitis 12/14/2008   Formatting of this note might be different from the original. Formatting of this note might be different from the original. Formatting of this note might be different from the original. Overview:  Qualifier: Diagnosis of  By: [name] LPN, Almyra Free   Last Assessment & Plan:  Avoid offending foods, start probiotics. Do not eat large meals in late evening and consider raising head of bed. Last Assessment   GERD (gastroesophageal reflux disease)    H. pylori infection 10/19/2013   H/O multiple allergies 99/37/1696   Helicobacter pylori (H. pylori) as the cause of diseases classified elsewhere 10/19/2013   Formatting of this note might be different from the original. Last Assessment & Plan:  Tetracycline, Pepto Bismol, Flagyl and Omeprazole.   Hepatitis 1970s   "don't know for sure which one it was; think it was A"  (07/18/2018)   History of food allergy 08/14/2018   History of pulmonary embolism 07/17/2018   Hoarseness 03/09/2019   Hypercalcemia 03/09/2019   Hyperglycemia 03/14/2016   Hyperlipidemia, mixed 05/26/2013   Crestor caused N/V Did not tolerate Lipitor    Hypertension    Knee pain, bilateral 12/17/2012   Left hip pain 05/23/2015   Low back pain 09/19/2015   Lower urinary tract infectious disease 10/19/2013   Lymphadenopathy 10/18/2017   Macular degeneration of both eyes 12/16/2015   Right eye is wet Left Eye is dry Shots every 11 to 12 weeks in right eye at opthamologist office   Medicare annual wellness visit, subsequent 02/21/2015   Mixed anxiety depressive disorder 12/14/2008   Formatting of this note might be different from the original. Overview:  Qualifier: Diagnosis of  By: [name] LPN, Almyra Free  Patient had increased  Tremors on Bupropion  Last Assessment & Plan:  Is struggling with the stress of her 65 year old granddaughter whom she raised moving back with her own mother, the patient is estranged from her daughter so she is very upset. Increase Citalopram 40 mg daily    Mumps 76 yrs old   Neck pain 04/22/2017   Numbness of finger 12/17/2012   Occlusion and stenosis of carotid artery without mention of cerebral infarction 12/17/2012   OSA (obstructive sleep apnea) 12/14/2008   PSG  06/2013-AHI 6/h 08/2011 ([facility]) AHI 4/h No longer needs CPAP    Preventative health care 03/14/2016   Pulmonary emboli ([place]) 05/26/2013   Recurrent infection of skin 07/19/2021   Renal insufficiency 07/17/2018   Rheumatoid factor positive 09/27/2021   Right shoulder pain 10/18/2017   Sensation of foreign body in larynx 04/08/2019   Skin lesion of back 03/27/2021   Sleep apnea    "don't use CPAP anymore; dr said I didn't have to" (07/18/2018)   Static tremor 11/05/2013   Formatting of this note might be different from the original. Last Assessment & Plan:  Follows with [facility]   Statin  intolerance 09/29/2019   Stroke [facility])    "been told I've had some strokes; didn't know I'd had them" (07/18/2018)   Thyroid disease    Vitamin D deficiency 03/03/2018   Vitreomacular adhesion of both eyes 01/07/2019   Formatting of this note might be different from the original.  no traction    Past Surgical History:  Procedure Laterality Date   APPENDECTOMY  1984   CARDIAC CATHETERIZATION  ~ 2006   CAROTID ANGIOGRAPHY Left 08/20/2018   Procedure: CAROTID ANGIOGRAPHY;  Surgeon: Serafina Mitchell, MD;  Location: [place] [facility];  Service: Cardiovascular;  Laterality: Left;   CAROTID ENDARTERECTOMY Right 05/10/2007   cea   CATARACT EXTRACTION Left    CATARACT EXTRACTION W/ INTRAOCULAR LENS  IMPLANT, BILATERAL Bilateral    COLONOSCOPY  09/25/2008   normal   COLONOSCOPY WITH PROPOFOL  02/13/2022   Jackquline [place] at [place] WITH STENT PLACEMENT  07/18/2018   CORONARY STENT INTERVENTION N/A 07/18/2018   Procedure: CORONARY STENT INTERVENTION;  Surgeon: Lorretta Harp, MD;  Location: [place] [facility];  Service: Cardiovascular;  Laterality: N/A;   INTRAVASCULAR PRESSURE WIRE/FFR STUDY N/A 07/18/2018   Procedure: INTRAVASCULAR PRESSURE WIRE/FFR STUDY;  Surgeon: Lorretta Harp, MD;  Location: [place] [facility];  Service: Cardiovascular;  Laterality: N/A;   JOINT REPLACEMENT     LEFT HEART CATH AND CORONARY ANGIOGRAPHY N/A 07/18/2018   Procedure: LEFT HEART CATH AND CORONARY ANGIOGRAPHY;  Surgeon: Lorretta Harp, MD;  Location: [place] [facility];  Service: Cardiovascular;  Laterality: N/A;   MULTIPLE TOOTH EXTRACTIONS     TOTAL KNEE ARTHROPLASTY Bilateral 2005 - 2011   right - left   VAGINAL HYSTERECTOMY  1984   WISDOM TOOTH EXTRACTION      Family History  Problem Relation Age of Onset   Stroke Mother        mini stroke   Kidney disease Mother    Heart failure Mother    Hypertension Mother    Diabetes Sister        type 2   Kidney disease Sister     Allergic rhinitis Sister    Osteoarthritis Sister    Hyperlipidemia Sister    Heart attack Maternal Grandfather    Colon cancer Neg Hx    Stomach cancer Neg Hx    Rectal cancer Neg Hx     Social History   Tobacco Use   Smoking status: Former    Packs/day: 2.00    Years: 30.00    Total pack years: 60.00    Types: Cigarettes    Quit date: 12/03/1994    Years since quitting: 27.3   Smokeless tobacco: Never   Tobacco comments:    Quit smoking 26 years ago  Substance Use Topics   Alcohol use: Yes    Comment: special occasions    Subjective:   Started early August with sensation of "lump in throat." Does not feel that food is getting stuck;  Does have GERD- only taking Famotidine daily; Pantoprazole was stopped in the past- caused mouth sores; patient feels like these symptoms she is discussing today could correlate with stopping the Protonix.    Objective:  Vitals:   04/11/22 1123 04/11/22 1127  BP: (!) 154/80 138/80  Pulse: 73   Temp: 98.7 F (37.1 C)   TempSrc: Oral   SpO2: 98%   Weight: 177 lb (80.3 kg)   Height: '5\' 2"'  (1.575 m)     General: Well developed, well nourished, in no acute distress  Skin : Warm and dry.  Head: Normocephalic and atraumatic  Eyes: Sclera and conjunctiva clear; pupils round and reactive to light; extraocular movements intact  Ears: External normal; canals clear; tympanic membranes normal  Oropharynx: Pink, supple. No suspicious lesions  Neck: Supple without  thyromegaly, adenopathy  Lungs: Respirations unlabored; wheezing noted;  CVS exam: normal rate and regular rhythm.  Neurologic: Alert and oriented; speech intact; face symmetrical; moves all extremities well; CNII-XII intact without focal deficit   Assessment:  1. Wheezing   2. Dysphagia, unspecified type   3. Thyroid disease   4. Need for immunization against influenza     Plan:  ? Uncontrolled GERD causing symptoms- will try starting Nexium 40 mg daily; continue Famotidine for  now; Update CXR and thyroid ultrasound; follow up to be determined; may need ENT and/or GI evaluation;   Flu shot given;   No follow-ups on file.  Orders Placed This Encounter  Procedures   DG Chest 2 View    Standing Status:   Future    Number of Occurrences:   1    Standing Expiration Date:   04/12/2023    Order Specific Question:   Reason for Exam (SYMPTOM  OR DIAGNOSIS REQUIRED)    Answer:   wheezing    Order Specific Question:   Preferred imaging location?    Answer:   [facility]   US THYROID    Standing Status:   Future    Standing Expiration Date:   04/12/2023    Order Specific Question:   Reason for Exam (SYMPTOM  OR DIAGNOSIS REQUIRED)    Answer:   thryoid enlarged/ "lump in throat"    Order Specific Question:   Preferred imaging location?    Answer:   [facility]   Flu Vaccine QUAD High Dose(Fluad)   CBC with Differential/Platelet   Comp Met (CMET)   TSH    Requested Prescriptions   Signed Prescriptions Disp Refills   esomeprazole (NEXIUM) 40 MG capsule 30 capsule 0    Sig: Take 1 capsule (40 mg total) by mouth daily.

## 2022-04-19 ENCOUNTER — Ambulatory Visit (INDEPENDENT_AMBULATORY_CARE_PROVIDER_SITE_OTHER): Payer: Medicare Other | Admitting: *Deleted

## 2022-04-19 DIAGNOSIS — Z0000 Encounter for general adult medical examination without abnormal findings: Secondary | ICD-10-CM | POA: Diagnosis not present

## 2022-04-19 NOTE — Patient Instructions (Signed)
Emily Mitchell , Thank you for taking time to come for your Medicare Wellness Visit. I appreciate your ongoing commitment to your health goals. Please review the following plan we discussed and let me know if I can assist you in the future.   These are the goals we discussed:  Goals      Exercise 3x per week (30 min per time)     Increase water intake     Patient Stated     Eat healthier        This is a list of the screening recommended for you and due dates:  Health Maintenance  Topic Date Due   COVID-19 Vaccine (6 - Pfizer risk series) 07/13/2021   Tetanus Vaccine  05/27/2023   Mammogram  05/29/2023   Colon Cancer Screening  02/13/2025   Pneumonia Vaccine  Completed   Flu Shot  Completed   DEXA scan (bone density measurement)  Completed   Hepatitis C Screening: USPSTF Recommendation to screen - Ages 65-79 yo.  Completed   Zoster (Shingles) Vaccine  Completed   HPV Vaccine  Aged Out      Next appointment: Follow up in one year for your annual wellness visit    Preventive Care 65 Years and Older, Female Preventive care refers to lifestyle choices and visits with your health care provider that can promote health and wellness. What does preventive care include? A yearly physical exam. This is also called an annual well check. Dental exams once or twice a year. Routine eye exams. Ask your health care provider how often you should have your eyes checked. Personal lifestyle choices, including: Daily care of your teeth and gums. Regular physical activity. Eating a healthy diet. Avoiding tobacco and drug use. Limiting alcohol use. Practicing safe sex. Taking low-dose aspirin every day. Taking vitamin and mineral supplements as recommended by your health care provider. What happens during an annual well check? The services and screenings done by your health care provider during your annual well check will depend on your age, overall health, lifestyle risk factors, and family  history of disease. Counseling  Your health care provider may ask you questions about your: Alcohol use. Tobacco use. Drug use. Emotional well-being. Home and relationship well-being. Sexual activity. Eating habits. History of falls. Memory and ability to understand (cognition). Work and work Statistician. Reproductive health. Screening  You may have the following tests or measurements: Height, weight, and BMI. Blood pressure. Lipid and cholesterol levels. These may be checked every 5 years, or more frequently if you are over 76 years old. Skin check. Lung cancer screening. You may have this screening every year starting at age 71 if you have a 30-pack-year history of smoking and currently smoke or have quit within the past 15 years. Fecal occult blood test (FOBT) of the stool. You may have this test every year starting at age 64. Flexible sigmoidoscopy or colonoscopy. You may have a sigmoidoscopy every 5 years or a colonoscopy every 10 years starting at age 56. Hepatitis C blood test. Hepatitis B blood test. Sexually transmitted disease (STD) testing. Diabetes screening. This is done by checking your blood sugar (glucose) after you have not eaten for a while (fasting). You may have this done every 1-3 years. Bone density scan. This is done to screen for osteoporosis. You may have this done starting at age 73. Mammogram. This may be done every 1-2 years. Talk to your health care provider about how often you should have regular mammograms. Talk with  your health care provider about your test results, treatment options, and if necessary, the need for more tests. Vaccines  Your health care provider may recommend certain vaccines, such as: Influenza vaccine. This is recommended every year. Tetanus, diphtheria, and acellular pertussis (Tdap, Td) vaccine. You may need a Td booster every 10 years. Zoster vaccine. You may need this after age 74. Pneumococcal 13-valent conjugate (PCV13)  vaccine. One dose is recommended after age 34. Pneumococcal polysaccharide (PPSV23) vaccine. One dose is recommended after age 36. Talk to your health care provider about which screenings and vaccines you need and how often you need them. This information is not intended to replace advice given to you by your health care provider. Make sure you discuss any questions you have with your health care provider. Document Released: 08/13/2015 Document Revised: 04/05/2016 Document Reviewed: 05/18/2015 Elsevier Interactive Patient Education  2017 [place] Prevention in the Home Falls can cause injuries. They can happen to people of all ages. There are many things you can do to make your home safe and to help prevent falls. What can I do on the outside of my home? Regularly fix the edges of walkways and driveways and fix any cracks. Remove anything that might make you trip as you walk through a door, such as a raised step or threshold. Trim any bushes or trees on the path to your home. Use bright outdoor lighting. Clear any walking paths of anything that might make someone trip, such as rocks or tools. Regularly check to see if handrails are loose or broken. Make sure that both sides of any steps have handrails. Any raised decks and porches should have guardrails on the edges. Have any leaves, snow, or ice cleared regularly. Use sand or salt on walking paths during winter. Clean up any spills in your garage right away. This includes oil or grease spills. What can I do in the bathroom? Use night lights. Install grab bars by the toilet and in the tub and shower. Do not use towel bars as grab bars. Use non-skid mats or decals in the tub or shower. If you need to sit down in the shower, use a plastic, non-slip stool. Keep the floor dry. Clean up any water that spills on the floor as soon as it happens. Remove soap buildup in the tub or shower regularly. Attach bath mats securely with  double-sided non-slip rug tape. Do not have throw rugs and other things on the floor that can make you trip. What can I do in the bedroom? Use night lights. Make sure that you have a light by your bed that is easy to reach. Do not use any sheets or blankets that are too big for your bed. They should not hang down onto the floor. Have a firm chair that has side arms. You can use this for support while you get dressed. Do not have throw rugs and other things on the floor that can make you trip. What can I do in the kitchen? Clean up any spills right away. Avoid walking on wet floors. Keep items that you use a lot in easy-to-reach places. If you need to reach something above you, use a strong step stool that has a grab bar. Keep electrical cords out of the way. Do not use floor polish or wax that makes floors slippery. If you must use wax, use non-skid floor wax. Do not have throw rugs and other things on the floor that can make you trip.  What can I do with my stairs? Do not leave any items on the stairs. Make sure that there are handrails on both sides of the stairs and use them. Fix handrails that are broken or loose. Make sure that handrails are as long as the stairways. Check any carpeting to make sure that it is firmly attached to the stairs. Fix any carpet that is loose or worn. Avoid having throw rugs at the top or bottom of the stairs. If you do have throw rugs, attach them to the floor with carpet tape. Make sure that you have a light switch at the top of the stairs and the bottom of the stairs. If you do not have them, ask someone to add them for you. What else can I do to help prevent falls? Wear shoes that: Do not have high heels. Have rubber bottoms. Are comfortable and fit you well. Are closed at the toe. Do not wear sandals. If you use a stepladder: Make sure that it is fully opened. Do not climb a closed stepladder. Make sure that both sides of the stepladder are locked  into place. Ask someone to hold it for you, if possible. Clearly mark and make sure that you can see: Any grab bars or handrails. First and last steps. Where the edge of each step is. Use tools that help you move around (mobility aids) if they are needed. These include: Canes. Walkers. Scooters. Crutches. Turn on the lights when you go into a dark area. Replace any light bulbs as soon as they burn out. Set up your furniture so you have a clear path. Avoid moving your furniture around. If any of your floors are uneven, fix them. If there are any pets around you, be aware of where they are. Review your medicines with your doctor. Some medicines can make you feel dizzy. This can increase your chance of falling. Ask your doctor what other things that you can do to help prevent falls. This information is not intended to replace advice given to you by your health care provider. Make sure you discuss any questions you have with your health care provider. Document Released: 05/13/2009 Document Revised: 12/23/2015 Document Reviewed: 08/21/2014 Elsevier Interactive Patient Education  2017 [employer].

## 2022-04-19 NOTE — Progress Notes (Addendum)
Subjective:   Emily Mitchell is a 76 y.o. female who presents for Medicare Annual (Subsequent) preventive examination.  I connected with  Emily Mitchell on 04/19/22 by a audio enabled telemedicine application and verified that I am speaking with the correct person using two identifiers.  Patient Location: Home  Provider Location: Office/Clinic  I discussed the limitations of evaluation and management by telemedicine. The patient expressed understanding and agreed to proceed.   Review of Systems    Defer to PCP Cardiac Risk Factors include: advanced age (>61mn, >>81women);dyslipidemia;hypertension     Objective:    There were no vitals filed for this visit. There is no height or weight on file to calculate BMI.     04/19/2022    3:46 PM 04/20/2021   12:46 PM 03/24/2021    2:40 PM 02/15/2021    9:49 AM 06/03/2020    8:26 PM 01/02/2020    2:34 PM 09/30/2019    8:48 AM  Advanced Directives  Does Patient Have a Medical Advance Directive? Yes Yes Yes Yes No;Yes Yes Yes  Type of AParamedicof [place] will  [place] will      Does patient want to make changes to medical advance directive? No - Patient declined        Copy of H[place]flowerin Chart? Yes - validated most recent copy scanned in chart (See row information)  Yes - validated most recent copy scanned in chart (See row information)        Current Medications (verified) Outpatient Encounter Medications as of 04/19/2022  Medication Sig   acetaminophen (TYLENOL) 500 MG tablet Take 500 mg by mouth as needed for headache.   albuterol (VENTOLIN HFA) 108 (90 Base) MCG/ACT inhaler Inhale 2 puffs into the lungs every 6 (six) hours as needed for wheezing or shortness of breath.   aspirin EC 81 MG tablet Take 1 tablet (81 mg total) by mouth daily.   BREZTRI AEROSPHERE 160-9-4.8 MCG/ACT AERO INHALE 2 PUFFS TWICE DAILY IN MORNING AND IN THE EVENING   Calcium  Citrate-Vitamin D 200-250 MG-UNIT TABS Take 1 tablet by mouth daily.   Cholecalciferol 25 MCG (1000 UT) tablet Take 1,000 Units by mouth daily.   Choline Fenofibrate (FENOFIBRIC ACID) 135 MG CPDR TAKE 1 CAPSULE BY MOUTH EVERY DAY   clonazePAM (KLONOPIN) 1 MG tablet TAKE 1 TABLET (1 MG TOTAL) BY MOUTH 3 (THREE) TIMES DAILY AS NEEDED. FOR ANXIETY   cyanocobalamin 100 MCG tablet Take 500 mcg by mouth daily.   esomeprazole (NEXIUM) 40 MG capsule Take 1 capsule (40 mg total) by mouth daily.   ezetimibe (ZETIA) 10 MG tablet Take 1 tablet (10 mg total) by mouth daily.   famotidine (PEPCID) 40 MG tablet TAKE 1 TABLET BY MOUTH EVERY DAY   FLUoxetine (PROZAC) 20 MG tablet Take 1 tablet (20 mg total) by mouth daily.   gabapentin (NEURONTIN) 100 MG capsule TAKE 2 CAPSULES BY MOUTH AT BEDTIME   levothyroxine (SYNTHROID) 100 MCG tablet Take 1 tablet (100 mcg total) by mouth daily.   montelukast (SINGULAIR) 10 MG tablet TAKE 1 TABLET BY MOUTH AT BEDTIME AS NEEDED   NON FORMULARY Take 1 tablet by mouth daily. Digestive advantage.   Omega-3 Fatty Acids (FISH OIL PO) Take 1 capsule by mouth daily.   Probiotic Product (VSL#3) CAPS Take 1 capsule by mouth daily.   [DISCONTINUED] traMADol (ULTRAM) 50 MG tablet Take 1 tablet (50 mg total) by mouth  every 8 (eight) hours as needed for moderate pain or severe pain.   No facility-administered encounter medications on file as of 04/19/2022.    Allergies (verified) Niaspan [niacin er], Pantoprazole, Sulfonamide derivatives, Bupropion, Nitroglycerin, Penicillins, Statins, Apple juice, and Banana   History: Past Medical History:  Diagnosis Date   Acid reflux    Anemia 07/05/2014   Anxiety and depression 12/14/2008   Qualifier: Diagnosis of  By: [name] LPN, Almyra Free     Arthritis    "fingers, toes, knees, joints" (07/18/2018)   Asthma    Atherosclerotic heart disease of native coronary artery without angina pectoris 07/19/2018   Atypical chest pain 05/23/2015   Back  pain 02/02/2019   Breast mass, left 04/28/2019   CAD in native artery 07/19/2018   Candidal skin infection 02/28/2018   Carotid artery disease ([place]) 06/16/2015   S/p CEA    Carotid artery occlusion    a. R carotid occluded, 18-56% LICA.   Chest pain 07/18/2018   CHF (congestive heart failure) (HCC)    Chicken pox as a child   Chronic kidney disease    Bright's Disease at age 55 , stage 4   Chronic obstructive pulmonary disease ([place]) 12/14/2008   Formatting of this note might be different from the original. Overview:  Spirometry 05/12/2013: FEV1 72% predicted FEV1 FVC ratio 73 predicted FEF 25 75  43% predicted  Last Assessment & Plan:  OK to stop taking advair Use albuterol as needed only Check oxygen levels during sleep - we will call you with results Call if symptoms worse   Chronic rhinitis 06/14/2018   CKD (chronic kidney disease), stage IV ([place])    Follows with [facility], Dr Alonza Smoker    Congestive heart failure ([place]) 11/30/2015   Formatting of this note might be different from the original. Last Assessment & Plan:  No recent exacerbation   Constipation 12/17/2008   Qualifier: Diagnosis of  By: Westly Pam    COPD with asthma ([place]) 11/06/2017   Essential hypertension 10/18/2015   Formatting of this note might be different from the original. Last Assessment & Plan:  poorlycontrolled and under a great deal of stress, add Metoprolol XL 25 mg daily. Encouraged heart healthy diet such as the DASH diet and exercise as tolerated.   Essential tremor 11/05/2013   Exudative age-related macular degeneration of right eye with active choroidal neovascularization ([place]) 08/26/2015   Gastro-esophageal reflux disease without esophagitis 12/14/2008   Formatting of this note might be different from the original. Formatting of this note might be different from the original. Formatting of this note might be different from the original. Overview:  Qualifier: Diagnosis of  By: [name]  LPN, Almyra Free   Last Assessment & Plan:  Avoid offending foods, start probiotics. Do not eat large meals in late evening and consider raising head of bed. Last Assessment   GERD (gastroesophageal reflux disease)    H. pylori infection 10/19/2013   H/O multiple allergies 31/49/7026   Helicobacter pylori (H. pylori) as the cause of diseases classified elsewhere 10/19/2013   Formatting of this note might be different from the original. Last Assessment & Plan:  Tetracycline, Pepto Bismol, Flagyl and Omeprazole.   Hepatitis 1970s   "don't know for sure which one it was; think it was A" (07/18/2018)   History of food allergy 08/14/2018   History of pulmonary embolism 07/17/2018   Hoarseness 03/09/2019   Hypercalcemia 03/09/2019   Hyperglycemia 03/14/2016   Hyperlipidemia, mixed 05/26/2013  Crestor caused N/V Did not tolerate Lipitor    Hypertension    Knee pain, bilateral 12/17/2012   Left hip pain 05/23/2015   Low back pain 09/19/2015   Lower urinary tract infectious disease 10/19/2013   Lymphadenopathy 10/18/2017   Macular degeneration of both eyes 12/16/2015   Right eye is wet Left Eye is dry Shots every 11 to 12 weeks in right eye at opthamologist office   Medicare annual wellness visit, subsequent 02/21/2015   Mixed anxiety depressive disorder 12/14/2008   Formatting of this note might be different from the original. Overview:  Qualifier: Diagnosis of  By: [name] LPN, Almyra Free  Patient had increased  Tremors on Bupropion  Last Assessment & Plan:  Is struggling with the stress of her 61 year old granddaughter whom she raised moving back with her own mother, the patient is estranged from her daughter so she is very upset. Increase Citalopram 40 mg daily    Mumps 76 yrs old   Neck pain 04/22/2017   Numbness of finger 12/17/2012   Occlusion and stenosis of carotid artery without mention of cerebral infarction 12/17/2012   OSA (obstructive sleep apnea) 12/14/2008   PSG  06/2013-AHI 6/h 08/2011  (bethany) AHI 4/h No longer needs CPAP    Preventative health care 03/14/2016   Pulmonary emboli ([place]) 05/26/2013   Recurrent infection of skin 07/19/2021   Renal insufficiency 07/17/2018   Rheumatoid factor positive 09/27/2021   Right shoulder pain 10/18/2017   Sensation of foreign body in larynx 04/08/2019   Skin lesion of back 03/27/2021   Sleep apnea    "don't use CPAP anymore; dr said I didn't have to" (07/18/2018)   Static tremor 11/05/2013   Formatting of this note might be different from the original. Last Assessment & Plan:  Follows with [facility]   Statin intolerance 09/29/2019   Stroke [facility])    "been told I've had some strokes; didn't know I'd had them" (07/18/2018)   Thyroid disease    Vitamin D deficiency 03/03/2018   Vitreomacular adhesion of both eyes 01/07/2019   Formatting of this note might be different from the original. no traction   Past Surgical History:  Procedure Laterality Date   APPENDECTOMY  1984   CARDIAC CATHETERIZATION  ~ 2006   CAROTID ANGIOGRAPHY Left 08/20/2018   Procedure: CAROTID ANGIOGRAPHY;  Surgeon: Serafina Mitchell, MD;  Location: [facility];  Service: Cardiovascular;  Laterality: Left;   CAROTID ENDARTERECTOMY Right 05/10/2007   cea   CATARACT EXTRACTION Left    CATARACT EXTRACTION W/ INTRAOCULAR LENS  IMPLANT, BILATERAL Bilateral    COLONOSCOPY  09/25/2008   normal   COLONOSCOPY WITH PROPOFOL  02/13/2022   Jackquline [place] at [place] WITH STENT PLACEMENT  07/18/2018   CORONARY STENT INTERVENTION N/A 07/18/2018   Procedure: CORONARY STENT INTERVENTION;  Surgeon: Lorretta Harp, MD;  Location: [facility];  Service: Cardiovascular;  Laterality: N/A;   INTRAVASCULAR PRESSURE WIRE/FFR STUDY N/A 07/18/2018   Procedure: INTRAVASCULAR PRESSURE WIRE/FFR STUDY;  Surgeon: Lorretta Harp, MD;  Location: [facility];  Service: Cardiovascular;  Laterality: N/A;   JOINT REPLACEMENT     LEFT HEART CATH AND  CORONARY ANGIOGRAPHY N/A 07/18/2018   Procedure: LEFT HEART CATH AND CORONARY ANGIOGRAPHY;  Surgeon: Lorretta Harp, MD;  Location: [facility];  Service: Cardiovascular;  Laterality: N/A;   MULTIPLE TOOTH EXTRACTIONS     TOTAL KNEE ARTHROPLASTY Bilateral 2005 - 2011  right - left   VAGINAL HYSTERECTOMY  1984   WISDOM TOOTH EXTRACTION     Family History  Problem Relation Age of Onset   Stroke Mother        mini stroke   Kidney disease Mother    Heart failure Mother    Hypertension Mother    Diabetes Sister        type 2   Kidney disease Sister    Allergic rhinitis Sister    Osteoarthritis Sister    Hyperlipidemia Sister    Heart attack Maternal Grandfather    Colon cancer Neg Hx    Stomach cancer Neg Hx    Rectal cancer Neg Hx    Social History   Socioeconomic History   Marital status: Divorced    Spouse name: Not on file   Number of children: 2   Years of education: Not on file   Highest education level: Not on file  Occupational History   Not on file  Tobacco Use   Smoking status: Former    Packs/day: 2.00    Years: 30.00    Total pack years: 60.00    Types: Cigarettes    Quit date: 12/03/1994    Years since quitting: 27.3   Smokeless tobacco: Never   Tobacco comments:    Quit smoking 26 years ago  Vaping Use   Vaping Use: Never used  Substance and Sexual Activity   Alcohol use: Yes    Comment: special occasions   Drug use: Never   Sexual activity: Not Currently    Birth control/protection: Post-menopausal    Comment: lives with son and friend, no dietary restrictions  Other Topics Concern   Not on file  Social History Narrative   Right handed   No caffeine   One story home   Social Determinants of Health   Financial Resource Strain: Low Risk  (03/24/2021)   Overall Financial Resource Strain (CARDIA)    Difficulty of Paying Living Expenses: Not hard at all  Food Insecurity: No Food Insecurity (03/24/2021)   Hunger Vital Sign    Worried  About Running Out of Food in the Last Year: Never true    Ran Out of Food in the Last Year: Never true  Transportation Needs: No Transportation Needs (03/24/2021)   PRAPARE - Hydrologist (Medical): No    Lack of Transportation (Non-Medical): No  Physical Activity: Insufficiently Active (03/24/2021)   Exercise Vital Sign    Days of Exercise per Week: 7 days    Minutes of Exercise per Session: 20 min  Stress: No Stress Concern Present (03/24/2021)   [place]    Feeling of Stress : Not at all  Social Connections: Socially Isolated (03/24/2021)   Social Connection and Isolation Panel [NHANES]    Frequency of Communication with Friends and Family: More than three times a week    Frequency of Social Gatherings with Friends and Family: More than three times a week    Attends Religious Services: Never    Marine scientist or Organizations: No    Attends Music therapist: Never    Marital Status: Divorced    Tobacco Counseling Counseling given: Not Answered Tobacco comments: Quit smoking 26 years ago   Clinical Intake:  Pre-visit preparation completed: Yes  Pain : No/denies pain     Diabetes: No    Diabetic? No   Activities of Daily Living  04/19/2022    3:47 PM 04/16/2022    9:09 PM  In your present state of health, do you have any difficulty performing the following activities:  Hearing? 0 0  Vision? 0 0  Comment wears readers   Difficulty concentrating or making decisions? 0 0  Walking or climbing stairs? 0 0  Dressing or bathing? 0 0  Doing errands, shopping? 0 0  Preparing Food and eating ? N N  Using the Toilet? N N  In the past six months, have you accidently leaked urine? Y Y  Do you have problems with loss of bowel control? N N  Managing your Medications? N N  Managing your Finances? N N  Housekeeping or managing your Housekeeping? N N     Patient Care Team: Mosie Lukes, MD as PCP - General (Family Medicine) Revankar, Reita Cliche, MD as PCP - Cardiology (Cardiology) Rigoberto Noel, MD as Consulting Physician (Pulmonary Disease) Donato Heinz, MD as Consulting Physician (Nephrology) Tat, Eustace Quail, DO as Consulting Physician (Neurology) Antionette Fairy Isaias Cowman, MD as Consulting Physician (Ophthalmology)  Indicate any recent Medical Services you may have received from other than [facility] providers in the past year (date may be approximate).     Assessment:   This is a routine wellness examination for [facility].  Hearing/Vision screen No results found.  Dietary issues and exercise activities discussed: Current Exercise Habits: Home exercise routine, Type of exercise: Other - see comments;stretching (stationary bike), Time (Minutes): 30, Frequency (Times/Week): 7, Weekly Exercise (Minutes/Week): 210, Intensity: Mild, Exercise limited by: None identified   Goals Addressed   None    Depression Screen    04/19/2022    3:55 PM 04/11/2022   11:27 AM 10/06/2021    1:48 PM 03/24/2021    2:42 PM 03/24/2021    2:08 PM 01/02/2020    2:34 PM 05/22/2019    1:15 PM  PHQ 2/9 Scores  PHQ - 2 Score 0 0 0 0 0 0 0    Fall Risk    04/19/2022    3:45 PM 04/16/2022    9:09 PM 04/11/2022   11:27 AM 04/03/2022    9:13 PM 10/06/2021    2:11 PM  Fall Risk   Falls in the past year? '1  1 1   1 1  '$ Number falls in past yr: 1 1 0 '1   1 1  '$ Injury with Fall? 0 0 1 0   0 0  Risk for fall due to : History of fall(s)  History of fall(s);Impaired balance/gait;Impaired mobility  History of fall(s)  Follow up Falls evaluation completed  Falls prevention discussed;Falls evaluation completed  Education provided;Falls prevention discussed;Falls evaluation completed    FALL RISK PREVENTION PERTAINING TO THE HOME:  Any stairs in or around the home? Yes  If so, are there any without handrails? No  Home free of loose throw rugs in walkways, pet beds,  electrical cords, etc? Yes  Adequate lighting in your home to reduce risk of falls? Yes   ASSISTIVE DEVICES UTILIZED TO PREVENT FALLS:  Life alert? No  Use of a cane, walker or w/c? No  Grab bars in the bathroom? No  Shower chair or bench in shower? Yes  Elevated toilet seat or a handicapped toilet? No   TIMED UP AND GO:  Was the test performed? No , audio visit   Cognitive Function:    09/18/2017    2:49 PM 09/14/2016    3:10 PM  MMSE - Mini Mental  State Exam  Orientation to time 5 5  Orientation to Place 5 5  Registration 3 3  Attention/ Calculation 5 5  Recall 2 3  Language- name 2 objects 2 2  Language- repeat 1 1  Language- follow 3 step command 3 3  Language- read & follow direction 1 1  Write a sentence 1 1  Copy design 1 1  Total score 29 30        04/19/2022    3:57 PM  6CIT Screen  What Year? 0 points  What month? 0 points  What time? 0 points  Count back from 20 0 points  Months in reverse 0 points  Repeat phrase 0 points  Total Score 0 points    Immunizations Immunization History  Administered Date(s) Administered   Fluad Quad(high Dose 65+) 04/17/2019, 04/14/2020, 04/11/2022   Influenza Split 04/29/2013   Influenza, High Dose Seasonal PF 03/30/2016, 04/19/2017, 05/02/2018   Influenza,inj,Quad PF,6+ Mos 03/26/2014, 05/17/2015   Influenza-Unspecified 06/14/2021   PFIZER(Purple Top)SARS-COV-2 Vaccination 10/19/2019, 11/10/2019, 05/08/2020, 10/30/2020   Pfizer Covid-19 Vaccine Bivalent Booster 40yr & up 05/18/2021   Pneumococcal Conjugate-13 04/29/2013   Pneumococcal Polysaccharide-23 04/30/2014, 04/17/2019   Tdap 05/26/2013   Zoster Recombinat (Shingrix) 04/27/2017, 06/13/2017   Zoster, Live 07/31/2012    TDAP status: Up to date  Flu Vaccine status: Up to date  Pneumococcal vaccine status: Up to date  Covid-19 vaccine status: Information provided on how to obtain vaccines.   Qualifies for Shingles Vaccine? Yes   Zostavax completed Yes    Shingrix Completed?: Yes  Screening Tests Health Maintenance  Topic Date Due   COVID-19 Vaccine (6 - Pfizer risk series) 07/13/2021   TETANUS/TDAP  05/27/2023   MAMMOGRAM  05/29/2023   COLONOSCOPY (Pts 45-481yrInsurance coverage will need to be confirmed)  02/13/2025   Pneumonia Vaccine 6538Years old  Completed   INFLUENZA VACCINE  Completed   DEXA SCAN  Completed   Hepatitis C Screening  Completed   Zoster Vaccines- Shingrix  Completed   HPV VACCINES  Aged Out    Health Maintenance  Health Maintenance Due  Topic Date Due   COVID-19 Vaccine (6 - Pfizer risk series) 07/13/2021    Colorectal cancer screening: Type of screening: Colonoscopy. Completed 02/13/22. Repeat every 3 years  Mammogram status: Completed 05/28/21. Repeat every year  Bone Density status: Completed 05/12/21. Results reflect: Bone density results: OSTEOPENIA. Repeat every 2 years.  Lung Cancer Screening: (Low Dose CT Chest recommended if Age 74-80ears, 30 pack-year currently smoking OR have quit w/in 15years.) does not qualify.   Lung Cancer Screening Referral: N/a  Additional Screening:  Hepatitis C Screening: does qualify; Completed 09/13/15  Vision Screening: Recommended annual ophthalmology exams for early detection of glaucoma and other disorders of the eye. Is the patient up to date with their annual eye exam?  Yes  Who is the provider or what is the name of the office in which the patient attends annual eye exams? Dr. HaClovia Cufff pt is not established with a provider, would they like to be referred to a provider to establish care? No .   Dental Screening: Recommended annual dental exams for proper oral hygiene  Community Resource Referral / Chronic Care Management: CRR required this visit?  No   CCM required this visit?  No      Plan:     I have personally reviewed and noted the following in the patient's chart:   Medical and social history Use of alcohol,  tobacco or illicit drugs   Current medications and supplements including opioid prescriptions. Patient is not currently taking opioid prescriptions. Functional ability and status Nutritional status Physical activity Advanced directives List of other physicians Hospitalizations, surgeries, and ER visits in previous 12 months Vitals Screenings to include cognitive, depression, and falls Referrals and appointments  In addition, I have reviewed and discussed with patient certain preventive protocols, quality metrics, and best practice recommendations. A written personalized care plan for preventive services as well as general preventive health recommendations were provided to patient.   Due to this being a telephonic visit, the after visit summary with patients personalized plan was offered to patient via mail or my-chart.Patient would like to access on my-chart.  Beatris Ship, [place]   04/19/2022   Nurse Notes: None    I have reviewed and agree with [facility] documentation.  Kathlene November, MD

## 2022-05-03 ENCOUNTER — Other Ambulatory Visit: Payer: Self-pay | Admitting: Family

## 2022-05-03 ENCOUNTER — Telehealth: Payer: Self-pay | Admitting: Family

## 2022-05-03 NOTE — Telephone Encounter (Signed)
Please call to check on her from last visit in September. Is she responding better to the Nexium? I can see she is going to see pulmonology- please keep that appointment; may be beneficial to try and see Dr. Charlett Blake before March 2024 if possible.

## 2022-05-04 NOTE — Telephone Encounter (Signed)
I have called the pt and she stated that she is doing well on the Nexium. She is going to keep the pulmonology appt and she now has a sooner appointment with Dr. Charlett Blake. She stated that her granddaughter is having twins. Just wanted to share the good news.

## 2022-05-10 ENCOUNTER — Telehealth: Payer: Self-pay

## 2022-05-10 DIAGNOSIS — H353222 Exudative age-related macular degeneration, left eye, with inactive choroidal neovascularization: Secondary | ICD-10-CM | POA: Diagnosis not present

## 2022-05-10 DIAGNOSIS — Z961 Presence of intraocular lens: Secondary | ICD-10-CM | POA: Diagnosis not present

## 2022-05-10 DIAGNOSIS — H35363 Drusen (degenerative) of macula, bilateral: Secondary | ICD-10-CM | POA: Diagnosis not present

## 2022-05-10 DIAGNOSIS — H353212 Exudative age-related macular degeneration, right eye, with inactive choroidal neovascularization: Secondary | ICD-10-CM | POA: Diagnosis not present

## 2022-05-10 DIAGNOSIS — H43823 Vitreomacular adhesion, bilateral: Secondary | ICD-10-CM | POA: Diagnosis not present

## 2022-05-10 DIAGNOSIS — H3554 Dystrophies primarily involving the retinal pigment epithelium: Secondary | ICD-10-CM | POA: Diagnosis not present

## 2022-05-10 NOTE — Patient Outreach (Signed)
  Care Coordination   05/10/2022 Name: Emily Mitchell MRN: [id number] DOB: 1946-06-18   Care Coordination Outreach Attempts:  An unsuccessful telephone outreach was attempted today to offer the patient information about available care coordination services as a benefit of their health plan.   Follow Up Plan:  Additional outreach attempts will be made to offer the patient care coordination information and services.   Encounter Outcome:  No Answer  Care Coordination Interventions Activated:  No   Care Coordination Interventions:  No, not indicated    Thea Silversmith, RN, MSN, BSN, [place] Coordinator [phone]

## 2022-05-11 NOTE — Telephone Encounter (Signed)
Pt called back to speak with [place].

## 2022-05-12 ENCOUNTER — Telehealth: Payer: Self-pay

## 2022-05-12 NOTE — Patient Outreach (Signed)
  Care Coordination   05/12/2022 Name: ANETHA SLAGEL MRN: [id number] DOB: 02-11-1946   Care Coordination Outreach Attempts:  A second unsuccessful outreach was attempted today to offer the patient with information about available care coordination services as a benefit of their health plan.     Follow Up Plan:  Additional outreach attempts will be made to offer the patient care coordination information and services.   Encounter Outcome:  No Answer  Care Coordination Interventions Activated:  No   Care Coordination Interventions:  No, not indicated    Thea Silversmith, RN, MSN, BSN, CCM Care Management Coordinator [employer] [phone]

## 2022-05-18 ENCOUNTER — Other Ambulatory Visit: Payer: Self-pay | Admitting: Family Medicine

## 2022-05-18 NOTE — Telephone Encounter (Signed)
Requesting: clonazepam '1mg'$   Contract:12/30/19 UDS:12/30/19 Last Visit: 04/11/22 w/ Mickel Baas Next Visit: 08/14/22 Last Refill: 02/27/22 #90 and 0RF   Please Advise

## 2022-05-19 ENCOUNTER — Ambulatory Visit: Payer: Medicare Other | Admitting: Adult Health

## 2022-05-19 ENCOUNTER — Encounter: Payer: Self-pay | Admitting: Adult Health

## 2022-05-19 DIAGNOSIS — J309 Allergic rhinitis, unspecified: Secondary | ICD-10-CM

## 2022-05-19 DIAGNOSIS — J4489 Other specified chronic obstructive pulmonary disease: Secondary | ICD-10-CM

## 2022-05-19 HISTORY — DX: Allergic rhinitis, unspecified: J30.9

## 2022-05-19 MED ORDER — ALBUTEROL SULFATE HFA 108 (90 BASE) MCG/ACT IN AERS: 2.0000 | INHALATION_SPRAY | Freq: Four times a day (QID) | RESPIRATORY_TRACT | 3 refills | Status: DC | PRN

## 2022-05-19 NOTE — Progress Notes (Signed)
$'@Patient'S$  ID: Emily Mitchell, female    DOB: 1945/11/30, 76 y.o.   MRN: [id number]  Chief Complaint  Patient presents with   Follow-up    Referring provider: Mosie Lukes, MD  HPI: 76 year old female former smoker followed for COPD with asthma Medical history significant for PE in 2014 on Coumadin from 2014 2019 History of OSA very mild in 2015 lost weight and came off the CPAP, retested 08/2021  Chronic kidney disease stage IV followed by nephrology, history of essential tremors and diastolic heart failure  TEST/EVENTS :  PSG at [facility] 08/2011 -217 lbs -AHI 4/h, TST 319 mins,supine    PSG 07/2013 -208 lbs -showed mild OSA TST - 390 mins,  AHI  6/h, RDI of 8/h The lowest desaturation was 87%      Spirometry 04/2013: FEV1 72% predicted FEV1 FVC ratio 73 Spirometry  10/2017  severe airway obstruction with ratio 60, FEV1 of 41% and FVC of 52%    Echo 08/2014 grade 2 DD   12/2014 ONO - no desatn >> dc O2  05/19/2022 Follow up : COPD with Asthma Patient returns for a 24-monthfollow-up.  Patient has underlying COPD with asthma.  Remains on Breztri twice daily.  She says overall she is doing okay.  Gets short of breath with activities.  No flare of cough or wheezing.  Remains on Singulair daily does have chronic allergies.  Denies any increased albuterol use. Chest x-ray last month showed no acute process.    Allergies  Allergen Reactions   Niaspan [Niacin Er] Nausea And Vomiting and Swelling    Swelling in mouth   Pantoprazole     Mouth sores   Sulfonamide Derivatives Hives   Bupropion Other (See Comments)    Uncontrollable shakes   Nitroglycerin     NOT allergic - cardiology has recommended she NOT use this due to h/o severe carotid disease as it may decrease cerebral perfusion   Penicillins Hives    DID THE REACTION INVOLVE: Swelling of the face/tongue/throat, SOB, or low BP? Yes Sudden or severe rash/hives, skin peeling, or the inside of the mouth or nose? Unknown Did it  require medical treatment? Unknown When did it last happen?   [age]  If all above answers are "NO", may proceed with cephalosporin use.   Statins    Apple Juice Rash   Banana Rash    Immunization History  Administered Date(s) Administered   Fluad Quad(high Dose 65+) 04/17/2019, 04/14/2020, 04/11/2022   Influenza Split 04/29/2013   Influenza, High Dose Seasonal PF 03/30/2016, 04/19/2017, 05/02/2018   Influenza,inj,Quad PF,6+ Mos 03/26/2014, 05/17/2015   Influenza-Unspecified 06/14/2021   PFIZER(Purple Top)SARS-COV-2 Vaccination 10/19/2019, 11/10/2019, 05/08/2020, 10/30/2020   Pfizer Covid-19 Vaccine Bivalent Booster 156yr& up 05/18/2021   Pneumococcal Conjugate-13 04/29/2013   Pneumococcal Polysaccharide-23 04/30/2014, 04/17/2019   Tdap 05/26/2013   Zoster Recombinat (Shingrix) 04/27/2017, 06/13/2017   Zoster, Live 07/31/2012    Past Medical History:  Diagnosis Date   Acid reflux    Anemia 07/05/2014   Anxiety and depression 12/14/2008   Qualifier: Diagnosis of  By: LaKellie SimmeringPN, JuAlmyra Free   Arthritis    "fingers, toes, knees, joints" (07/18/2018)   Asthma    Atherosclerotic heart disease of native coronary artery without angina pectoris 07/19/2018   Atypical chest pain 05/23/2015   Back pain 02/02/2019   Breast mass, left 04/28/2019   CAD in native artery 07/19/2018   Candidal skin infection 02/28/2018   Carotid artery disease ([place]11/16/2016  S/p CEA    Carotid artery occlusion    a. R carotid occluded, 39-76% LICA.   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 Overview:  Spirometry 05/12/2013: FEV1 72% predicted FEV1 FVC ratio 73 predicted FEF 25 75  43% predicted  Last Assessment & Plan:  OK to stop taking advair Use albuterol as needed only  Check oxygen levels during sleep - we will call you with results Call if symptoms worse   Chronic rhinitis 06/14/2018   CKD (chronic kidney disease), stage IV ([place])    Follows with [facility], Dr Alonza Smoker    Congestive heart failure ([place]) 11/30/2015   Formatting of this note might be different from the original. Last Assessment & Plan:  No recent exacerbation   Constipation 12/17/2008   Qualifier: Diagnosis of  By: Westly Pam    COPD with asthma 11/06/2017   Essential hypertension 10/18/2015   Formatting of this note might be different from the original. Last Assessment & Plan:  poorlycontrolled and under a great deal of stress, add Metoprolol XL 25 mg daily. Encouraged heart healthy diet such as the DASH diet and exercise as tolerated.   Essential tremor 11/05/2013   Exudative age-related macular degeneration of right eye with active choroidal neovascularization ([place]) 08/26/2015   Gastro-esophageal reflux disease without esophagitis 12/14/2008   Formatting of this note might be different from the original. Formatting of this note might be different from the original. Formatting of this note might be different from the original. Overview:  Qualifier: Diagnosis of  By: [name] LPN, Almyra Free   Last Assessment & Plan:  Avoid offending foods, start probiotics. Do not eat large meals in late evening and consider raising head of bed. Last Assessment   GERD (gastroesophageal reflux disease)    H. pylori infection 10/19/2013   H/O multiple allergies 73/41/9379   Helicobacter pylori (H. pylori) as the cause of diseases classified elsewhere 10/19/2013   Formatting of this note might be different from the original. Last Assessment & Plan:  Tetracycline, Pepto Bismol, Flagyl and Omeprazole.   Hepatitis 1970s   "don't know for sure which one it was; think it was A" (07/18/2018)   History of food allergy 08/14/2018   History of pulmonary embolism 07/17/2018   Hoarseness 03/09/2019    Hypercalcemia 03/09/2019   Hyperglycemia 03/14/2016   Hyperlipidemia, mixed 05/26/2013   Crestor caused N/V Did not tolerate Lipitor    Hypertension    Knee pain, bilateral 12/17/2012   Left hip pain 05/23/2015   Low back pain 09/19/2015   Lower urinary tract infectious disease 10/19/2013   Lymphadenopathy 10/18/2017   Macular degeneration of both eyes 12/16/2015   Right eye is wet Left Eye is dry Shots every 11 to 12 weeks in right eye at opthamologist office   Medicare annual wellness visit, subsequent 02/21/2015   Mixed anxiety depressive disorder 12/14/2008   Formatting of this note might be different from the original. Overview:  Qualifier: Diagnosis of  By: [name] LPN, Almyra Free  Patient had increased  Tremors on Bupropion  Last Assessment & Plan:  Is struggling with the stress of her 72 year old granddaughter whom she raised moving back with her own mother, the patient is  estranged from her daughter so she is very upset. Increase Citalopram 40 mg daily    Mumps 76 yrs old   Neck pain 04/22/2017   Numbness of finger 12/17/2012   Occlusion and stenosis of carotid artery without mention of cerebral infarction 12/17/2012   OSA (obstructive sleep apnea) 12/14/2008   PSG  06/2013-AHI 6/h 08/2011 ([facility]) AHI 4/h No longer needs CPAP    Preventative health care 03/14/2016   Pulmonary emboli ([place]) 05/26/2013   Recurrent infection of skin 07/19/2021   Renal insufficiency 07/17/2018   Rheumatoid factor positive 09/27/2021   Right shoulder pain 10/18/2017   Sensation of foreign body in larynx 04/08/2019   Skin lesion of back 03/27/2021   Sleep apnea    "don't use CPAP anymore; dr said I didn't have to" (07/18/2018)   Static tremor 11/05/2013   Formatting of this note might be different from the original. Last Assessment & Plan:  Follows with [facility]   Statin intolerance 09/29/2019   Stroke [facility]    "been told I've had some strokes; didn't know I'd had them" (07/18/2018)   Thyroid  disease    Vitamin D deficiency 03/03/2018   Vitreomacular adhesion of both eyes 01/07/2019   Formatting of this note might be different from the original. no traction    Tobacco History: Social History   Tobacco Use  Smoking Status Former   Packs/day: 2.00   Years: 30.00   Total pack years: 60.00   Types: Cigarettes   Quit date: 12/03/1994   Years since quitting: 27.4  Smokeless Tobacco Never  Tobacco Comments   Quit smoking 26 years ago   Counseling given: Not Answered Tobacco comments: Quit smoking 26 years ago   Outpatient Medications Prior to Visit  Medication Sig Dispense Refill   acetaminophen (TYLENOL) 500 MG tablet Take 500 mg by mouth as needed for headache.     aspirin EC 81 MG tablet Take 1 tablet (81 mg total) by mouth daily. 90 tablet 3   BREZTRI AEROSPHERE 160-9-4.8 MCG/ACT AERO INHALE 2 PUFFS TWICE DAILY IN MORNING AND IN THE EVENING 10.7 each 4   Calcium Citrate-Vitamin D 200-250 MG-UNIT TABS Take 1 tablet by mouth daily.     Cholecalciferol 25 MCG (1000 UT) tablet Take 1,000 Units by mouth daily.     Choline Fenofibrate (FENOFIBRIC ACID) 135 MG CPDR TAKE 1 CAPSULE BY MOUTH EVERY DAY 90 capsule 1   clonazePAM (KLONOPIN) 1 MG tablet TAKE 1 TABLET (1 MG TOTAL) BY MOUTH 3 (THREE) TIMES DAILY AS NEEDED. FOR ANXIETY 90 tablet 0   cyanocobalamin 100 MCG tablet Take 500 mcg by mouth daily.     esomeprazole (NEXIUM) 40 MG capsule TAKE 1 CAPSULE (40 MG TOTAL) BY MOUTH DAILY. 90 capsule 1   ezetimibe (ZETIA) 10 MG tablet Take 1 tablet (10 mg total) by mouth daily. 90 tablet 1   famotidine (PEPCID) 40 MG tablet TAKE 1 TABLET BY MOUTH EVERY DAY 90 tablet 1   FLUoxetine (PROZAC) 20 MG tablet Take 1 tablet (20 mg total) by mouth daily. 90 tablet 1   gabapentin (NEURONTIN) 100 MG capsule TAKE 2 CAPSULES BY MOUTH AT BEDTIME 180 capsule 1   levothyroxine (SYNTHROID) 100 MCG tablet Take 1 tablet (100 mcg total) by mouth daily. 90 tablet 1   montelukast (SINGULAIR) 10 MG tablet  TAKE 1 TABLET BY MOUTH AT BEDTIME AS NEEDED 90 tablet 1   NON FORMULARY Take 1 tablet by mouth daily. Digestive advantage.  Omega-3 Fatty Acids (FISH OIL PO) Take 1 capsule by mouth daily.     Probiotic Product (VSL#3) CAPS Take 1 capsule by mouth daily.     albuterol (VENTOLIN HFA) 108 (90 Base) MCG/ACT inhaler Inhale 2 puffs into the lungs every 6 (six) hours as needed for wheezing or shortness of breath. (Patient not taking: Reported on 05/19/2022) 18 g 3   No facility-administered medications prior to visit.     Review of Systems:   Constitutional:   No  weight loss, night sweats,  Fevers, chills,  +fatigue, or  lassitude.  HEENT:   No headaches,  Difficulty swallowing,  Tooth/dental problems, or  Sore throat,                No sneezing, itching, ear ache, + nasal congestion, post nasal drip,   CV:  No chest pain,  Orthopnea, PND, swelling in lower extremities, anasarca, dizziness, palpitations, syncope.   GI  No heartburn, indigestion, abdominal pain, nausea, vomiting, diarrhea, change in bowel habits, loss of appetite, bloody stools.   Resp:   No chest wall deformity  Skin: no rash or lesions.  GU: no dysuria, change in color of urine, no urgency or frequency.  No flank pain, no hematuria   MS:  No joint pain or swelling.  No decreased range of motion.  No back pain.    Physical Exam  BP 130/80 (BP Location: Left Arm, Patient Position: Sitting, Cuff Size: Large)   Pulse 85   Temp 97.7 F (36.5 C) (Oral)   Ht '5\' 2"'$  (1.575 m)   Wt 174 lb 9.6 oz (79.2 kg)   SpO2 94%   BMI 31.93 kg/m   GEN: A/Ox3; pleasant , NAD, well nourished    HEENT:  [place]/AT,  NOSE-clear, THROAT-clear, no lesions, no postnasal drip or exudate noted.   NECK:  Supple w/ fair ROM; no JVD; normal carotid impulses w/o bruits; no thyromegaly or nodules palpated; no lymphadenopathy.    RESP  Clear  P & A; w/o, wheezes/ rales/ or rhonchi. no accessory muscle use, no dullness to percussion  CARD:   RRR, no m/r/g, no peripheral edema, pulses intact, no cyanosis or clubbing.  GI:   Soft & nt; nml bowel sounds; no organomegaly or masses detected.   Musco: Warm bil, no deformities or joint swelling noted.   Neuro: alert, no focal deficits noted.    Skin: Warm, no lesions or rashes    Lab Results:  CBC   BMET    Imaging: No results found.        No data to display          No results found for: "NITRICOXIDE"      Assessment & Plan:   COPD with asthma ([place]) Continue on present regimen.  Continue on Breztri for maintenance therapy.  Plan Patient Instructions  Continue on BREZTRI 2 puffs Twice daily, rinse after use.  Singulair daily.  Albuterol inhaler As needed   Activity as tolerated .  Positional sleep , work on weight loss.  Work on healthy sleep regimen .  Follow up with Dr. Elsworth Soho or Shateria Paternostro NP in 6 months As needed       Allergic rhinitis Continue on current regimen     Rexene Edison, NP 05/19/2022

## 2022-05-19 NOTE — Assessment & Plan Note (Signed)
Continue on current regimen .   

## 2022-05-19 NOTE — Assessment & Plan Note (Signed)
Continue on present regimen.  Continue on Breztri for maintenance therapy.  Plan Patient Instructions  Continue on BREZTRI 2 puffs Twice daily, rinse after use.  Singulair daily.  Albuterol inhaler As needed   Activity as tolerated .  Positional sleep , work on weight loss.  Work on healthy sleep regimen .  Follow up with Dr. Elsworth Soho or Flynn Lininger NP in 6 months As needed

## 2022-05-19 NOTE — Patient Instructions (Signed)
Continue on BREZTRI 2 puffs Twice daily, rinse after use.  Singulair daily.  Albuterol inhaler As needed   Activity as tolerated .  Positional sleep , work on weight loss.  Work on healthy sleep regimen .  Follow up with Dr. Elsworth Soho or Karla Pavone NP in 6 months As needed

## 2022-05-22 ENCOUNTER — Other Ambulatory Visit: Payer: Self-pay

## 2022-05-22 DIAGNOSIS — R739 Hyperglycemia, unspecified: Secondary | ICD-10-CM

## 2022-05-22 DIAGNOSIS — E079 Disorder of thyroid, unspecified: Secondary | ICD-10-CM

## 2022-05-22 DIAGNOSIS — E559 Vitamin D deficiency, unspecified: Secondary | ICD-10-CM

## 2022-05-22 DIAGNOSIS — E782 Mixed hyperlipidemia: Secondary | ICD-10-CM

## 2022-05-22 DIAGNOSIS — N289 Disorder of kidney and ureter, unspecified: Secondary | ICD-10-CM

## 2022-05-23 ENCOUNTER — Other Ambulatory Visit (INDEPENDENT_AMBULATORY_CARE_PROVIDER_SITE_OTHER): Payer: Medicare Other

## 2022-05-23 DIAGNOSIS — E079 Disorder of thyroid, unspecified: Secondary | ICD-10-CM | POA: Diagnosis not present

## 2022-05-23 DIAGNOSIS — E559 Vitamin D deficiency, unspecified: Secondary | ICD-10-CM

## 2022-05-23 DIAGNOSIS — N289 Disorder of kidney and ureter, unspecified: Secondary | ICD-10-CM

## 2022-05-23 DIAGNOSIS — E782 Mixed hyperlipidemia: Secondary | ICD-10-CM

## 2022-05-23 DIAGNOSIS — D649 Anemia, unspecified: Secondary | ICD-10-CM | POA: Diagnosis not present

## 2022-05-23 LAB — CBC WITH DIFFERENTIAL/PLATELET
Basophils Absolute: 0.1 10*3/uL (ref 0.0–0.1)
Basophils Relative: 1.1 % (ref 0.0–3.0)
Eosinophils Absolute: 0.2 10*3/uL (ref 0.0–0.7)
Eosinophils Relative: 4.5 % (ref 0.0–5.0)
HCT: 39 % (ref 36.0–46.0)
Hemoglobin: 12.8 g/dL (ref 12.0–15.0)
Lymphocytes Relative: 28 % (ref 12.0–46.0)
Lymphs Abs: 1.5 10*3/uL (ref 0.7–4.0)
MCHC: 32.9 g/dL (ref 30.0–36.0)
MCV: 89.3 fl (ref 78.0–100.0)
Monocytes Absolute: 0.5 10*3/uL (ref 0.1–1.0)
Monocytes Relative: 9.8 % (ref 3.0–12.0)
Neutro Abs: 3 10*3/uL (ref 1.4–7.7)
Neutrophils Relative %: 56.6 % (ref 43.0–77.0)
Platelets: 238 10*3/uL (ref 150.0–400.0)
RBC: 4.36 Mil/uL (ref 3.87–5.11)
RDW: 14.2 % (ref 11.5–15.5)
WBC: 5.2 10*3/uL (ref 4.0–10.5)

## 2022-05-23 LAB — COMPREHENSIVE METABOLIC PANEL
ALT: 20 U/L (ref 0–35)
AST: 25 U/L (ref 0–37)
Albumin: 3.9 g/dL (ref 3.5–5.2)
Alkaline Phosphatase: 43 U/L (ref 39–117)
BUN: 33 mg/dL — ABNORMAL HIGH (ref 6–23)
CO2: 28 mEq/L (ref 19–32)
Calcium: 9.6 mg/dL (ref 8.4–10.5)
Chloride: 106 mEq/L (ref 96–112)
Creatinine, Ser: 1.66 mg/dL — ABNORMAL HIGH (ref 0.40–1.20)
GFR: 30.27 mL/min — ABNORMAL LOW (ref >60.00)
Glucose, Bld: 75 mg/dL (ref 70–99)
Potassium: 4.4 mEq/L (ref 3.5–5.1)
Sodium: 141 mEq/L (ref 135–145)
Total Bilirubin: 0.6 mg/dL (ref 0.2–1.2)
Total Protein: 6.3 g/dL (ref 6.0–8.3)

## 2022-05-23 LAB — PHOSPHORUS: Phosphorus: 3.6 mg/dL (ref 2.3–4.6)

## 2022-05-23 LAB — LIPID PANEL
Cholesterol: 114 mg/dL (ref 0–200)
HDL: 43.3 mg/dL (ref >39.00)
LDL Cholesterol: 47 mg/dL (ref 0–99)
NonHDL: 71.19
Total CHOL/HDL Ratio: 3
Triglycerides: 120 mg/dL (ref 0.0–149.0)
VLDL: 24 mg/dL (ref 0.0–40.0)

## 2022-05-23 LAB — URINALYSIS
Bilirubin Urine: NEGATIVE
Hgb urine dipstick: NEGATIVE
Ketones, ur: NEGATIVE
Leukocytes,Ua: NEGATIVE
Nitrite: NEGATIVE
Specific Gravity, Urine: 1.025 (ref 1.000–1.030)
Total Protein, Urine: NEGATIVE
Urine Glucose: NEGATIVE
Urobilinogen, UA: 0.2 (ref 0.0–1.0)
pH: 6 (ref 5.0–8.0)

## 2022-05-23 LAB — TSH: TSH: 0.14 u[IU]/mL — ABNORMAL LOW (ref 0.35–5.50)

## 2022-05-23 LAB — T4, FREE: Free T4: 1.11 ng/dL (ref 0.60–1.60)

## 2022-05-23 LAB — VITAMIN D 25 HYDROXY (VIT D DEFICIENCY, FRACTURES): VITD: 58.02 ng/mL (ref 30.00–100.00)

## 2022-05-23 NOTE — Addendum Note (Signed)
Addended by: Manuela Schwartz on: 05/23/2022 10:17 AM   Modules accepted: Orders

## 2022-05-23 NOTE — Addendum Note (Signed)
Addended by: Manuela Schwartz on: 05/23/2022 10:20 AM   Modules accepted: Orders

## 2022-05-24 LAB — IRON,TIBC AND FERRITIN PANEL
%SAT: 26 % (calc) (ref 16–45)
Ferritin: 62 ng/mL (ref 16–288)
Iron: 104 ug/dL (ref 45–160)
TIBC: 395 mcg/dL (calc) (ref 250–450)

## 2022-05-29 DIAGNOSIS — R92333 Mammographic heterogeneous density, bilateral breasts: Secondary | ICD-10-CM | POA: Diagnosis not present

## 2022-05-29 DIAGNOSIS — Z1231 Encounter for screening mammogram for malignant neoplasm of breast: Secondary | ICD-10-CM | POA: Diagnosis not present

## 2022-07-03 DIAGNOSIS — N2581 Secondary hyperparathyroidism of renal origin: Secondary | ICD-10-CM | POA: Diagnosis not present

## 2022-07-03 DIAGNOSIS — N1832 Chronic kidney disease, stage 3b: Secondary | ICD-10-CM | POA: Diagnosis not present

## 2022-07-03 DIAGNOSIS — N390 Urinary tract infection, site not specified: Secondary | ICD-10-CM | POA: Diagnosis not present

## 2022-07-03 DIAGNOSIS — R251 Tremor, unspecified: Secondary | ICD-10-CM | POA: Diagnosis not present

## 2022-07-03 DIAGNOSIS — E785 Hyperlipidemia, unspecified: Secondary | ICD-10-CM | POA: Diagnosis not present

## 2022-07-03 DIAGNOSIS — I6529 Occlusion and stenosis of unspecified carotid artery: Secondary | ICD-10-CM | POA: Diagnosis not present

## 2022-07-03 DIAGNOSIS — I129 Hypertensive chronic kidney disease with stage 1 through stage 4 chronic kidney disease, or unspecified chronic kidney disease: Secondary | ICD-10-CM | POA: Diagnosis not present

## 2022-07-03 DIAGNOSIS — D631 Anemia in chronic kidney disease: Secondary | ICD-10-CM | POA: Diagnosis not present

## 2022-07-03 DIAGNOSIS — I509 Heart failure, unspecified: Secondary | ICD-10-CM | POA: Diagnosis not present

## 2022-07-04 ENCOUNTER — Other Ambulatory Visit: Payer: Self-pay | Admitting: Family Medicine

## 2022-07-12 DIAGNOSIS — H353222 Exudative age-related macular degeneration, left eye, with inactive choroidal neovascularization: Secondary | ICD-10-CM | POA: Diagnosis not present

## 2022-07-12 DIAGNOSIS — H353211 Exudative age-related macular degeneration, right eye, with active choroidal neovascularization: Secondary | ICD-10-CM | POA: Diagnosis not present

## 2022-07-12 DIAGNOSIS — H43823 Vitreomacular adhesion, bilateral: Secondary | ICD-10-CM | POA: Diagnosis not present

## 2022-07-12 DIAGNOSIS — H35342 Macular cyst, hole, or pseudohole, left eye: Secondary | ICD-10-CM | POA: Diagnosis not present

## 2022-07-12 DIAGNOSIS — H35363 Drusen (degenerative) of macula, bilateral: Secondary | ICD-10-CM | POA: Diagnosis not present

## 2022-07-12 DIAGNOSIS — H3554 Dystrophies primarily involving the retinal pigment epithelium: Secondary | ICD-10-CM | POA: Diagnosis not present

## 2022-07-17 ENCOUNTER — Telehealth: Payer: Self-pay | Admitting: Family Medicine

## 2022-07-17 NOTE — Telephone Encounter (Signed)
Pt wanted to ask pcp if she could get labs from her kidney dr done at our office. [facility], Dr.Joseph Coladonato. She stated she has always gotten them done at our office, please advise.

## 2022-07-18 DIAGNOSIS — N390 Urinary tract infection, site not specified: Secondary | ICD-10-CM | POA: Diagnosis not present

## 2022-07-18 DIAGNOSIS — N1832 Chronic kidney disease, stage 3b: Secondary | ICD-10-CM | POA: Diagnosis not present

## 2022-07-18 NOTE — Telephone Encounter (Signed)
Called pt and not able to spoke with her Nvm but sent pt  My chart message.

## 2022-07-19 NOTE — Telephone Encounter (Signed)
Pt stated she got kidney labs done yesterday and no longer needs to get them done here.

## 2022-07-19 NOTE — Telephone Encounter (Signed)
Sound good

## 2022-07-27 ENCOUNTER — Other Ambulatory Visit: Payer: Self-pay | Admitting: Pulmonary Disease

## 2022-08-14 ENCOUNTER — Ambulatory Visit (INDEPENDENT_AMBULATORY_CARE_PROVIDER_SITE_OTHER): Payer: Medicare Other | Admitting: Family Medicine

## 2022-08-14 VITALS — BP 128/78 | HR 84 | Temp 97.5°F | Resp 16 | Ht 62.0 in | Wt 174.6 lb

## 2022-08-14 DIAGNOSIS — D649 Anemia, unspecified: Secondary | ICD-10-CM | POA: Diagnosis not present

## 2022-08-14 DIAGNOSIS — I10 Essential (primary) hypertension: Secondary | ICD-10-CM

## 2022-08-14 DIAGNOSIS — G250 Essential tremor: Secondary | ICD-10-CM | POA: Diagnosis not present

## 2022-08-14 DIAGNOSIS — R252 Cramp and spasm: Secondary | ICD-10-CM | POA: Diagnosis not present

## 2022-08-14 DIAGNOSIS — E782 Mixed hyperlipidemia: Secondary | ICD-10-CM | POA: Diagnosis not present

## 2022-08-14 DIAGNOSIS — R739 Hyperglycemia, unspecified: Secondary | ICD-10-CM | POA: Diagnosis not present

## 2022-08-14 DIAGNOSIS — Z789 Other specified health status: Secondary | ICD-10-CM | POA: Diagnosis not present

## 2022-08-14 DIAGNOSIS — J45909 Unspecified asthma, uncomplicated: Secondary | ICD-10-CM

## 2022-08-14 DIAGNOSIS — E079 Disorder of thyroid, unspecified: Secondary | ICD-10-CM

## 2022-08-14 DIAGNOSIS — E559 Vitamin D deficiency, unspecified: Secondary | ICD-10-CM

## 2022-08-14 DIAGNOSIS — N184 Chronic kidney disease, stage 4 (severe): Secondary | ICD-10-CM | POA: Diagnosis not present

## 2022-08-14 DIAGNOSIS — I509 Heart failure, unspecified: Secondary | ICD-10-CM

## 2022-08-14 NOTE — Patient Instructions (Addendum)
Tetanus was given on May 26, 2013 due on May 27, 2023  Muscle Cramps and Spasms Muscle cramps and spasms are when muscles tighten by themselves. They usually get better within minutes. Muscle cramps are painful. They are usually stronger and last longer than muscle spasms. Muscle spasms may or may not be painful. They can last a few seconds or much longer. Cramps and spasms can affect any muscle, but they occur most often in the calf muscles of the leg. They are usually not caused by a serious problem. In many cases, the cause is not known. Some common causes include: Doing more physical work or exercise than your body is ready for. Using the muscles too much (overuse) by repeating certain movements too many times. Staying in a certain position for a long time. Playing a sport or doing an activity without preparing properly. Using bad form or technique while playing a sport or doing an activity. Not having enough water in your body (dehydration). Injury. Side effects of some medicines. Low levels of the salts and minerals in your blood (electrolytes), such as low potassium or calcium. Follow these instructions at home: Managing pain and stiffness     Massage, stretch, and relax the muscle. Do this for many minutes at a time. If told, put heat on tight or tense muscles as often as told by your doctor. Use the heat source that your doctor recommends, such as a moist heat pack or a heating pad. Place a towel between your skin and the heat source. Leave the heat on for 20-30 minutes. Remove the heat if your skin turns bright red. This is very important if you are not able to feel pain, heat, or cold. You may have a greater risk of getting burned. If told, put ice on the affected area. This may help if you are sore or have pain after a cramp or spasm. Put ice in a plastic bag. Place a towel between your skin and the bag. Leave the ice on for 20 minutes, 2-3 times a day. Try taking hot  showers or baths to help relax tight muscles. Eating and drinking Drink enough fluid to keep your pee (urine) pale yellow. Eat a healthy diet to help ensure that your muscles work well. This should include: Fruits and vegetables. Lean protein. Whole grains. Low-fat or nonfat dairy products. General instructions If you are having cramps often, avoid intense exercise for several days. Take over-the-counter and prescription medicines only as told by your doctor. Watch for any changes in your symptoms. Keep all follow-up visits as told by your doctor. This is important. Contact a doctor if: Your cramps or spasms get worse or happen more often. Your cramps or spasms do not get better with time. Summary Muscle cramps and spasms are when muscles tighten by themselves. They usually get better within minutes. Cramps and spasms occur most often in the calf muscles of the leg. Massage, stretch, and relax the muscle. This may help the cramp or spasm go away. Drink enough fluid to keep your pee (urine) pale yellow. This information is not intended to replace advice given to you by your health care provider. Make sure you discuss any questions you have with your health care provider. Document Revised: 02/04/2021 Document Reviewed: 02/04/2021 Elsevier Patient Education  [employer] 7, [place]

## 2022-08-14 NOTE — Progress Notes (Signed)
Subjective:   By signing my name below, I, Marlana Latus, attest that this documentation has been prepared under the direction and in the presence of Mosie Lukes MD 08/14/2022   Patient ID: Emily Mitchell, female    DOB: 01-14-1946, 77 y.o.   MRN: [id number]  Chief Complaint  Patient presents with   Follow-up    Follow up    HPI Patient is in today for a follow up. She presents overall well. She is accompanied by a family member.   She complains of constant muscle cramps in the legs. She also complains of her fingers locking, which she attributes to age and arthritis. She has been drinking propel water to try to stay hydrated.   She reports went to her nephrologist on December 4th, where she recalled that she was told she was at the bottom of stage 3, going into stage 4.   She is up to date on her flu and RSV vaccinations. She reports that the pharmacy has been inquiring about her next tetanus vaccination, though it has not hit the 10 year mark yet (last tetanus shot on 05/26/2013).   She denies having any fever, new joint pain, new moles, congestion, sinus pain, sore throat, chest pain, palpitations, cough, SOB, wheezing, n/v/d, constipation, blood in stool, dysuria, frequency, hematuria, or headaches at this time.  Past Medical History:  Diagnosis Date   Acid reflux    Anemia 07/05/2014   Anxiety and depression 12/14/2008   Qualifier: Diagnosis of  By: [name] LPN, Almyra Free     Arthritis    "fingers, toes, knees, joints" (07/18/2018)   Asthma    Atherosclerotic heart disease of native coronary artery without angina pectoris 07/19/2018   Atypical chest pain 05/23/2015   Back pain 02/02/2019   Breast mass, left 04/28/2019   CAD in native artery 07/19/2018   Candidal skin infection 02/28/2018   Carotid artery disease ([place]) 06/16/2015   S/p CEA    Carotid artery occlusion    a. R carotid occluded, 16-01% LICA.   Chest pain 07/18/2018   CHF (congestive heart failure) (HCC)     Chicken pox as a child   Chronic kidney disease    Bright's Disease at age 55 , stage 4   Chronic obstructive pulmonary disease ([place]) 12/14/2008   Formatting of this note might be different from the original. Overview:  Spirometry 05/12/2013: FEV1 72% predicted FEV1 FVC ratio 73 predicted FEF 25 75  43% predicted  Last Assessment & Plan:  OK to stop taking advair Use albuterol as needed only Check oxygen levels during sleep - we will call you with results Call if symptoms worse   Chronic rhinitis 06/14/2018   CKD (chronic kidney disease), stage IV ([place])    Follows with [facility], Dr Alonza Smoker    Congestive heart failure ([place]) 11/30/2015   Formatting of this note might be different from the original. Last Assessment & Plan:  No recent exacerbation   Constipation 12/17/2008   Qualifier: Diagnosis of  By: Westly Pam    COPD with asthma 11/06/2017   Essential hypertension 10/18/2015   Formatting of this note might be different from the original. Last Assessment & Plan:  poorlycontrolled and under a great deal of stress, add Metoprolol XL 25 mg daily. Encouraged heart healthy diet such as the DASH diet and exercise as tolerated.   Essential tremor 11/05/2013   Exudative age-related macular degeneration of right eye with active choroidal neovascularization ([place]) 08/26/2015  Gastro-esophageal reflux disease without esophagitis 12/14/2008   Formatting of this note might be different from the original. Formatting of this note might be different from the original. Formatting of this note might be different from the original. Overview:  Qualifier: Diagnosis of  By: [name] LPN, Almyra Free   Last Assessment & Plan:  Avoid offending foods, start probiotics. Do not eat large meals in late evening and consider raising head of bed. Last Assessment   GERD (gastroesophageal reflux disease)    H. pylori infection 10/19/2013   H/O multiple allergies 59/74/1638   Helicobacter pylori (H.  pylori) as the cause of diseases classified elsewhere 10/19/2013   Formatting of this note might be different from the original. Last Assessment & Plan:  Tetracycline, Pepto Bismol, Flagyl and Omeprazole.   Hepatitis 1970s   "don't know for sure which one it was; think it was A" (07/18/2018)   History of food allergy 08/14/2018   History of pulmonary embolism 07/17/2018   Hoarseness 03/09/2019   Hypercalcemia 03/09/2019   Hyperglycemia 03/14/2016   Hyperlipidemia, mixed 05/26/2013   Crestor caused N/V Did not tolerate Lipitor    Hypertension    Knee pain, bilateral 12/17/2012   Left hip pain 05/23/2015   Low back pain 09/19/2015   Lower urinary tract infectious disease 10/19/2013   Lymphadenopathy 10/18/2017   Macular degeneration of both eyes 12/16/2015   Right eye is wet Left Eye is dry Shots every 11 to 12 weeks in right eye at opthamologist office   Medicare annual wellness visit, subsequent 02/21/2015   Mixed anxiety depressive disorder 12/14/2008   Formatting of this note might be different from the original. Overview:  Qualifier: Diagnosis of  By: [name] LPN, Almyra Free  Patient had increased  Tremors on Bupropion  Last Assessment & Plan:  Is struggling with the stress of her 64 year old granddaughter whom she raised moving back with her own mother, the patient is estranged from her daughter so she is very upset. Increase Citalopram 40 mg daily    Mumps 77 yrs old   Neck pain 04/22/2017   Numbness of finger 12/17/2012   Occlusion and stenosis of carotid artery without mention of cerebral infarction 12/17/2012   OSA (obstructive sleep apnea) 12/14/2008   PSG  06/2013-AHI 6/h 08/2011 ([facility]) AHI 4/h No longer needs CPAP    Preventative health care 03/14/2016   Pulmonary emboli ([place]) 05/26/2013   Recurrent infection of skin 07/19/2021   Renal insufficiency 07/17/2018   Rheumatoid factor positive 09/27/2021   Right shoulder pain 10/18/2017   Sensation of foreign body in larynx  04/08/2019   Skin lesion of back 03/27/2021   Sleep apnea    "don't use CPAP anymore; dr said I didn't have to" (07/18/2018)   Static tremor 11/05/2013   Formatting of this note might be different from the original. Last Assessment & Plan:  Follows with [facility]y   Statin intolerance 09/29/2019   Stroke [facility])    "been told I've had some strokes; didn't know I'd had them" (07/18/2018)   Thyroid disease    Vitamin D deficiency 03/03/2018   Vitreomacular adhesion of both eyes 01/07/2019   Formatting of this note might be different from the original. no traction    Past Surgical History:  Procedure Laterality Date   APPENDECTOMY  1984   CARDIAC CATHETERIZATION  ~ 2006   CAROTID ANGIOGRAPHY Left 08/20/2018   Procedure: CAROTID ANGIOGRAPHY;  Surgeon: Serafina Mitchell, MD;  Location: [place] [facility];  Service:  Cardiovascular;  Laterality: Left;   CAROTID ENDARTERECTOMY Right 05/10/2007   cea   CATARACT EXTRACTION Left    CATARACT EXTRACTION W/ INTRAOCULAR LENS  IMPLANT, BILATERAL Bilateral    COLONOSCOPY  09/25/2008   normal   COLONOSCOPY WITH PROPOFOL  02/13/2022   Jackquline [place] at [facility] WITH STENT PLACEMENT  07/18/2018   CORONARY STENT INTERVENTION N/A 07/18/2018   Procedure: CORONARY STENT INTERVENTION;  Surgeon: Lorretta Harp, MD;  Location: [place] [facility];  Service: Cardiovascular;  Laterality: N/A;   INTRAVASCULAR PRESSURE WIRE/FFR STUDY N/A 07/18/2018   Procedure: INTRAVASCULAR PRESSURE WIRE/FFR STUDY;  Surgeon: Lorretta Harp, MD;  Location: [place] [facility];  Service: Cardiovascular;  Laterality: N/A;   JOINT REPLACEMENT     LEFT HEART CATH AND CORONARY ANGIOGRAPHY N/A 07/18/2018   Procedure: LEFT HEART CATH AND CORONARY ANGIOGRAPHY;  Surgeon: Lorretta Harp, MD;  Location: [place] [facility];  Service: Cardiovascular;  Laterality: N/A;   MULTIPLE TOOTH EXTRACTIONS     TOTAL KNEE ARTHROPLASTY Bilateral 2005 - 2011   right - left    VAGINAL HYSTERECTOMY  1984   WISDOM TOOTH EXTRACTION      Family History  Problem Relation Age of Onset   Stroke Mother        mini stroke   Kidney disease Mother    Heart failure Mother    Hypertension Mother    Diabetes Sister        type 2   Kidney disease Sister    Allergic rhinitis Sister    Osteoarthritis Sister    Hyperlipidemia Sister    Heart attack Maternal Grandfather    Colon cancer Neg Hx    Stomach cancer Neg Hx    Rectal cancer Neg Hx     Social History   Socioeconomic History   Marital status: Divorced    Spouse name: Not on file   Number of children: 2   Years of education: Not on file   Highest education level: Not on file  Occupational History   Not on file  Tobacco Use   Smoking status: Former    Packs/day: 2.00    Years: 30.00    Total pack years: 60.00    Types: Cigarettes    Quit date: 12/03/1994    Years since quitting: 27.7   Smokeless tobacco: Never   Tobacco comments:    Quit smoking 26 years ago  Vaping Use   Vaping Use: Never used  Substance and Sexual Activity   Alcohol use: Yes    Comment: special occasions   Drug use: Never   Sexual activity: Not Currently    Birth control/protection: Post-menopausal    Comment: lives with son and friend, no dietary restrictions  Other Topics Concern   Not on file  Social History Narrative   Right handed   No caffeine   One story home   Social Determinants of Health   Financial Resource Strain: Low Risk  (03/24/2021)   Overall Financial Resource Strain (CARDIA)    Difficulty of Paying Living Expenses: Not hard at all  Food Insecurity: No Food Insecurity (03/24/2021)   Hunger Vital Sign    Worried About Running Out of Food in the Last Year: Never true    Ran Out of Food in the Last Year: Never true  Transportation Needs: No Transportation Needs (03/24/2021)   PRAPARE - Hydrologist (Medical): No    Lack of Transportation (Non-Medical):  No  Physical Activity:  Insufficiently Active (03/24/2021)   Exercise Vital Sign    Days of Exercise per Week: 7 days    Minutes of Exercise per Session: 20 min  Stress: No Stress Concern Present (03/24/2021)   [place]    Feeling of Stress : Not at all  Social Connections: Socially Isolated (03/24/2021)   Social Connection and Isolation Panel [NHANES]    Frequency of Communication with Friends and Family: More than three times a week    Frequency of Social Gatherings with Friends and Family: More than three times a week    Attends Religious Services: Never    Marine scientist or Organizations: No    Attends Archivist Meetings: Never    Marital Status: Divorced  Human resources officer Violence: Not At Risk (03/24/2021)   Humiliation, Afraid, Rape, and Kick questionnaire    Fear of Current or Ex-Partner: No    Emotionally Abused: No    Physically Abused: No    Sexually Abused: No    Outpatient Medications Prior to Visit  Medication Sig Dispense Refill   acetaminophen (TYLENOL) 500 MG tablet Take 500 mg by mouth as needed for headache.     albuterol (VENTOLIN HFA) 108 (90 Base) MCG/ACT inhaler Inhale 2 puffs into the lungs every 6 (six) hours as needed for wheezing or shortness of breath. 18 g 3   aspirin EC 81 MG tablet Take 1 tablet (81 mg total) by mouth daily. 90 tablet 3   atorvastatin (LIPITOR) 10 MG tablet TAKE 1 TABLET BY MOUTH EVERY DAY 90 tablet 1   BREZTRI AEROSPHERE 160-9-4.8 MCG/ACT AERO INHALE 2 PUFFS TWICE DAILY IN MORNING AND IN THE EVENING 10.7 each 4   Calcium Citrate-Vitamin D 200-250 MG-UNIT TABS Take 1 tablet by mouth daily.     Cholecalciferol 25 MCG (1000 UT) tablet Take 1,000 Units by mouth daily.     Choline Fenofibrate (FENOFIBRIC ACID) 135 MG CPDR TAKE 1 CAPSULE BY MOUTH EVERY DAY 90 capsule 1   clonazePAM (KLONOPIN) 1 MG tablet TAKE 1 TABLET (1 MG TOTAL) BY MOUTH 3 (THREE) TIMES DAILY AS NEEDED. FOR ANXIETY  90 tablet 0   cyanocobalamin 100 MCG tablet Take 500 mcg by mouth daily.     esomeprazole (NEXIUM) 40 MG capsule TAKE 1 CAPSULE (40 MG TOTAL) BY MOUTH DAILY. 90 capsule 1   ezetimibe (ZETIA) 10 MG tablet Take 1 tablet (10 mg total) by mouth daily. 90 tablet 1   famotidine (PEPCID) 40 MG tablet TAKE 1 TABLET BY MOUTH EVERY DAY 90 tablet 1   FLUoxetine (PROZAC) 20 MG tablet Take 1 tablet (20 mg total) by mouth daily. 90 tablet 1   gabapentin (NEURONTIN) 100 MG capsule TAKE 2 CAPSULES BY MOUTH AT BEDTIME 180 capsule 1   levothyroxine (SYNTHROID) 100 MCG tablet Take 1 tablet (100 mcg total) by mouth daily. 90 tablet 1   montelukast (SINGULAIR) 10 MG tablet TAKE 1 TABLET BY MOUTH AT BEDTIME AS NEEDED 90 tablet 1   NON FORMULARY Take 1 tablet by mouth daily. Digestive advantage.     Omega-3 Fatty Acids (FISH OIL PO) Take 1 capsule by mouth daily.     Probiotic Product (VSL#3) CAPS Take 1 capsule by mouth daily.     No facility-administered medications prior to visit.    Allergies  Allergen Reactions   Niaspan [Niacin Er] Nausea And Vomiting and Swelling    Swelling in mouth  Pantoprazole     Mouth sores   Sulfonamide Derivatives Hives   Bupropion Other (See Comments)    Uncontrollable shakes   Nitroglycerin     NOT allergic - cardiology has recommended she NOT use this due to h/o severe carotid disease as it may decrease cerebral perfusion   Penicillins Hives    DID THE REACTION INVOLVE: Swelling of the face/tongue/throat, SOB, or low BP? Yes Sudden or severe rash/hives, skin peeling, or the inside of the mouth or nose? Unknown Did it require medical treatment? Unknown When did it last happen?   [age]  If all above answers are "NO", may proceed with cephalosporin use.   Statins    Apple Juice Rash   Banana Rash    Review of Systems  Musculoskeletal:  Positive for myalgias (cramps in legs).       Objective:    Physical Exam Constitutional:      General: She is not in acute  distress.    Appearance: Normal appearance.  HENT:     Head: Normocephalic and atraumatic.     Right Ear: Tympanic membrane, ear canal and external ear normal.     Left Ear: Tympanic membrane, ear canal and external ear normal.  Eyes:     Extraocular Movements: Extraocular movements intact.     Pupils: Pupils are equal, round, and reactive to light.  Cardiovascular:     Rate and Rhythm: Normal rate and regular rhythm.     Pulses: Normal pulses.     Heart sounds: Normal heart sounds. No murmur heard.    No gallop.  Pulmonary:     Effort: Pulmonary effort is normal. No respiratory distress.     Breath sounds: Normal breath sounds. No wheezing.  Abdominal:     General: Abdomen is flat.     Palpations: Abdomen is soft. There is no mass.     Tenderness: There is no guarding.  Musculoskeletal:     Right lower leg: No edema.     Left lower leg: No edema.  Skin:    General: Skin is warm and dry.  Neurological:     General: No focal deficit present.     Mental Status: She is alert and oriented to person, place, and time.     Cranial Nerves: No cranial nerve deficit.     Coordination: Coordination normal.  Psychiatric:        Judgment: Judgment normal.     BP 128/78 (BP Location: Right Arm, Patient Position: Sitting, Cuff Size: Normal)   Pulse 84   Temp (!) 97.5 F (36.4 C) (Oral)   Resp 16   Ht '5\' 2"'$  (1.575 m)   Wt 174 lb 9.6 oz (79.2 kg)   SpO2 95%   BMI 31.93 kg/m  Wt Readings from Last 3 Encounters:  08/14/22 174 lb 9.6 oz (79.2 kg)  05/19/22 174 lb 9.6 oz (79.2 kg)  04/11/22 177 lb (80.3 kg)    Diabetic Foot Exam - Simple   No data filed    Lab Results  Component Value Date   WBC 7.1 08/14/2022   HGB 13.2 08/14/2022   HCT 39.7 08/14/2022   PLT 277.0 08/14/2022   GLUCOSE 91 08/14/2022   CHOL 114 05/23/2022   TRIG 120.0 05/23/2022   HDL 43.30 05/23/2022   LDLDIRECT 103.0 07/14/2019   LDLCALC 47 05/23/2022   ALT 19 08/14/2022   AST 25 08/14/2022   NA 143  08/14/2022   K 4.4 08/14/2022   CL 107  08/14/2022   CREATININE 1.74 (H) 08/14/2022   BUN 32 (H) 08/14/2022   CO2 28 08/14/2022   TSH 0.88 08/14/2022   INR 1.79 07/18/2018   HGBA1C 5.6 08/14/2022    Lab Results  Component Value Date   TSH 0.88 08/14/2022   Lab Results  Component Value Date   WBC 7.1 08/14/2022   HGB 13.2 08/14/2022   HCT 39.7 08/14/2022   MCV 89.3 08/14/2022   PLT 277.0 08/14/2022   Lab Results  Component Value Date   NA 143 08/14/2022   K 4.4 08/14/2022   CO2 28 08/14/2022   GLUCOSE 91 08/14/2022   BUN 32 (H) 08/14/2022   CREATININE 1.74 (H) 08/14/2022   BILITOT 0.4 08/14/2022   ALKPHOS 48 08/14/2022   AST 25 08/14/2022   ALT 19 08/14/2022   PROT 6.3 08/14/2022   ALBUMIN 4.0 08/14/2022   CALCIUM 9.5 08/14/2022   ANIONGAP 13 02/15/2021   EGFR 35 10/31/2021   GFR 28.56 (L) 08/14/2022   Lab Results  Component Value Date   CHOL 114 05/23/2022   Lab Results  Component Value Date   HDL 43.30 05/23/2022   Lab Results  Component Value Date   LDLCALC 47 05/23/2022   Lab Results  Component Value Date   TRIG 120.0 05/23/2022   Lab Results  Component Value Date   CHOLHDL 3 05/23/2022   Lab Results  Component Value Date   HGBA1C 5.6 08/14/2022       Assessment & Plan:   Problem List Items Addressed This Visit     CKD (chronic kidney disease), stage IV ([place]) - Primary    Hydrate and monitor.        Thyroid disease   Hyperlipidemia, mixed    Tolerating statin, encouraged heart healthy diet, avoid trans fats, minimize simple carbs and saturated fats. Increase exercise as tolerated      Essential tremor   Anemia   Relevant Orders   CBC w/Diff (Completed)   Essential hypertension    Well controlled, no changes to meds. Encouraged heart healthy diet such as the DASH diet and exercise as tolerated.       Relevant Orders   TSH (Completed)   Hyperglycemia    hgba1c acceptable, minimize simple carbs. Increase exercise as tolerated.        Relevant Orders   HgB A1c (Completed)   Vitamin D deficiency    Supplement and monitor      Relevant Orders   VITAMIN D 25 Hydroxy (Vit-D Deficiency, Fractures) (Completed)   Statin intolerance    Myalgias with lipitor      Asthma    No recent exacerbation      CHF (congestive heart failure) (HCC)    No recent exacerbation      Other Visit Diagnoses     Muscle cramps       Relevant Orders   Magnesium (Completed)   CK (Creatine Kinase) (Completed)   Comp Met (CMET) (Completed)       No orders of the defined types were placed in this encounter.  I, Penni Homans, MD, personally preformed the services described in this documentation.  All medical record entries made by the scribe were at my direction and in my presence.  I have reviewed the chart and discharge instructions (if applicable) and agree that the record reflects my personal performance and is accurate and complete. 08/17/22   Penni Homans, MD

## 2022-08-15 LAB — CBC WITH DIFFERENTIAL/PLATELET
Basophils Absolute: 0.1 10*3/uL (ref 0.0–0.1)
Basophils Relative: 0.7 % (ref 0.0–3.0)
Eosinophils Absolute: 0.1 10*3/uL (ref 0.0–0.7)
Eosinophils Relative: 1.8 % (ref 0.0–5.0)
HCT: 39.7 % (ref 36.0–46.0)
Hemoglobin: 13.2 g/dL (ref 12.0–15.0)
Lymphocytes Relative: 17.5 % (ref 12.0–46.0)
Lymphs Abs: 1.2 10*3/uL (ref 0.7–4.0)
MCHC: 33.3 g/dL (ref 30.0–36.0)
MCV: 89.3 fl (ref 78.0–100.0)
Monocytes Absolute: 0.6 10*3/uL (ref 0.1–1.0)
Monocytes Relative: 7.9 % (ref 3.0–12.0)
Neutro Abs: 5.1 10*3/uL (ref 1.4–7.7)
Neutrophils Relative %: 72.1 % (ref 43.0–77.0)
Platelets: 277 10*3/uL (ref 150.0–400.0)
RBC: 4.44 Mil/uL (ref 3.87–5.11)
RDW: 14.2 % (ref 11.5–15.5)
WBC: 7.1 10*3/uL (ref 4.0–10.5)

## 2022-08-15 LAB — MAGNESIUM: Magnesium: 2.1 mg/dL (ref 1.5–2.5)

## 2022-08-15 LAB — COMPREHENSIVE METABOLIC PANEL
ALT: 19 U/L (ref 0–35)
AST: 25 U/L (ref 0–37)
Albumin: 4 g/dL (ref 3.5–5.2)
Alkaline Phosphatase: 48 U/L (ref 39–117)
BUN: 32 mg/dL — ABNORMAL HIGH (ref 6–23)
CO2: 28 mEq/L (ref 19–32)
Calcium: 9.5 mg/dL (ref 8.4–10.5)
Chloride: 107 mEq/L (ref 96–112)
Creatinine, Ser: 1.74 mg/dL — ABNORMAL HIGH (ref 0.40–1.20)
GFR: 28.56 mL/min — ABNORMAL LOW (ref >60.00)
Glucose, Bld: 91 mg/dL (ref 70–99)
Potassium: 4.4 mEq/L (ref 3.5–5.1)
Sodium: 143 mEq/L (ref 135–145)
Total Bilirubin: 0.4 mg/dL (ref 0.2–1.2)
Total Protein: 6.3 g/dL (ref 6.0–8.3)

## 2022-08-15 LAB — VITAMIN D 25 HYDROXY (VIT D DEFICIENCY, FRACTURES): VITD: 49.55 ng/mL (ref 30.00–100.00)

## 2022-08-15 LAB — TSH: TSH: 0.88 u[IU]/mL (ref 0.35–5.50)

## 2022-08-15 LAB — HEMOGLOBIN A1C: Hgb A1c MFr Bld: 5.6 % (ref 4.6–6.5)

## 2022-08-15 LAB — CK: Total CK: 69 U/L (ref 7–177)

## 2022-08-17 ENCOUNTER — Other Ambulatory Visit: Payer: Self-pay | Admitting: Family Medicine

## 2022-08-17 NOTE — Assessment & Plan Note (Signed)
Supplement and monitor 

## 2022-08-17 NOTE — Assessment & Plan Note (Signed)
Tolerating statin, encouraged heart healthy diet, avoid trans fats, minimize simple carbs and saturated fats. Increase exercise as tolerated 

## 2022-08-17 NOTE — Assessment & Plan Note (Signed)
No recent exacerbation 

## 2022-08-17 NOTE — Assessment & Plan Note (Signed)
Well controlled, no changes to meds. Encouraged heart healthy diet such as the DASH diet and exercise as tolerated.

## 2022-08-17 NOTE — Assessment & Plan Note (Addendum)
Hydrate and monitor 

## 2022-08-17 NOTE — Assessment & Plan Note (Signed)
hgba1c acceptable, minimize simple carbs. Increase exercise as tolerated.  

## 2022-08-17 NOTE — Assessment & Plan Note (Signed)
Myalgias with lipitor

## 2022-08-19 ENCOUNTER — Other Ambulatory Visit: Payer: Self-pay | Admitting: Family Medicine

## 2022-08-21 NOTE — Telephone Encounter (Signed)
Requesting: clonazepam '1mg'$   Contract: 11/16/14 UDS: 12/30/19 Last Visit: 08/14/22 Next Visit: 12/14/22 Last Refill: 05/18/22 #90 and 0RF   Please Advise

## 2022-08-22 DIAGNOSIS — I10 Essential (primary) hypertension: Secondary | ICD-10-CM | POA: Diagnosis not present

## 2022-08-22 DIAGNOSIS — Z043 Encounter for examination and observation following other accident: Secondary | ICD-10-CM | POA: Diagnosis not present

## 2022-08-22 DIAGNOSIS — Z955 Presence of coronary angioplasty implant and graft: Secondary | ICD-10-CM | POA: Diagnosis not present

## 2022-08-22 DIAGNOSIS — M25511 Pain in right shoulder: Secondary | ICD-10-CM | POA: Diagnosis not present

## 2022-08-22 DIAGNOSIS — W1839XA Other fall on same level, initial encounter: Secondary | ICD-10-CM | POA: Diagnosis not present

## 2022-08-22 DIAGNOSIS — R930 Abnormal findings on diagnostic imaging of skull and head, not elsewhere classified: Secondary | ICD-10-CM | POA: Diagnosis not present

## 2022-08-22 DIAGNOSIS — M542 Cervicalgia: Secondary | ICD-10-CM | POA: Diagnosis not present

## 2022-08-22 DIAGNOSIS — M25512 Pain in left shoulder: Secondary | ICD-10-CM | POA: Diagnosis not present

## 2022-08-22 DIAGNOSIS — M25551 Pain in right hip: Secondary | ICD-10-CM | POA: Diagnosis not present

## 2022-08-22 DIAGNOSIS — Y998 Other external cause status: Secondary | ICD-10-CM | POA: Diagnosis not present

## 2022-08-22 DIAGNOSIS — I6381 Other cerebral infarction due to occlusion or stenosis of small artery: Secondary | ICD-10-CM | POA: Diagnosis not present

## 2022-08-22 DIAGNOSIS — Z7901 Long term (current) use of anticoagulants: Secondary | ICD-10-CM | POA: Diagnosis not present

## 2022-08-22 DIAGNOSIS — M79603 Pain in arm, unspecified: Secondary | ICD-10-CM | POA: Diagnosis not present

## 2022-08-22 DIAGNOSIS — M545 Low back pain, unspecified: Secondary | ICD-10-CM | POA: Diagnosis not present

## 2022-08-22 DIAGNOSIS — R0781 Pleurodynia: Secondary | ICD-10-CM | POA: Diagnosis not present

## 2022-08-22 DIAGNOSIS — S0990XA Unspecified injury of head, initial encounter: Secondary | ICD-10-CM | POA: Diagnosis not present

## 2022-08-23 ENCOUNTER — Telehealth: Payer: Self-pay | Admitting: Family Medicine

## 2022-08-23 NOTE — Telephone Encounter (Signed)
Patient wanted to let Dr. Charlett Blake know that she fell yesterday while going up a few stairs and landed on some concrete. She went to ED and they took xrays and everything turned out okay. She's just sore.

## 2022-09-08 ENCOUNTER — Encounter: Payer: Self-pay | Admitting: Family Medicine

## 2022-09-09 ENCOUNTER — Other Ambulatory Visit: Payer: Self-pay | Admitting: Family Medicine

## 2022-09-09 MED ORDER — CYCLOBENZAPRINE HCL 10 MG PO TABS: 10.0000 mg | ORAL_TABLET | Freq: Every evening | ORAL | 1 refills | Status: DC | PRN

## 2022-09-21 DIAGNOSIS — H353211 Exudative age-related macular degeneration, right eye, with active choroidal neovascularization: Secondary | ICD-10-CM | POA: Diagnosis not present

## 2022-09-21 DIAGNOSIS — H35342 Macular cyst, hole, or pseudohole, left eye: Secondary | ICD-10-CM | POA: Diagnosis not present

## 2022-09-21 DIAGNOSIS — H3554 Dystrophies primarily involving the retinal pigment epithelium: Secondary | ICD-10-CM | POA: Diagnosis not present

## 2022-09-21 DIAGNOSIS — H353222 Exudative age-related macular degeneration, left eye, with inactive choroidal neovascularization: Secondary | ICD-10-CM | POA: Diagnosis not present

## 2022-10-09 ENCOUNTER — Encounter: Payer: Medicare Other | Admitting: Family Medicine

## 2022-10-10 ENCOUNTER — Encounter: Payer: Medicare Other | Admitting: Family Medicine

## 2022-10-14 ENCOUNTER — Other Ambulatory Visit: Payer: Self-pay | Admitting: Family Medicine

## 2022-10-14 ENCOUNTER — Other Ambulatory Visit: Payer: Self-pay | Admitting: Family

## 2022-11-02 DIAGNOSIS — H35363 Drusen (degenerative) of macula, bilateral: Secondary | ICD-10-CM | POA: Diagnosis not present

## 2022-11-02 DIAGNOSIS — H353211 Exudative age-related macular degeneration, right eye, with active choroidal neovascularization: Secondary | ICD-10-CM | POA: Diagnosis not present

## 2022-11-02 DIAGNOSIS — H353222 Exudative age-related macular degeneration, left eye, with inactive choroidal neovascularization: Secondary | ICD-10-CM | POA: Diagnosis not present

## 2022-11-02 DIAGNOSIS — H35342 Macular cyst, hole, or pseudohole, left eye: Secondary | ICD-10-CM | POA: Diagnosis not present

## 2022-11-09 DIAGNOSIS — N1832 Chronic kidney disease, stage 3b: Secondary | ICD-10-CM | POA: Diagnosis not present

## 2022-11-09 DIAGNOSIS — I129 Hypertensive chronic kidney disease with stage 1 through stage 4 chronic kidney disease, or unspecified chronic kidney disease: Secondary | ICD-10-CM | POA: Diagnosis not present

## 2022-11-09 DIAGNOSIS — I509 Heart failure, unspecified: Secondary | ICD-10-CM | POA: Diagnosis not present

## 2022-11-09 DIAGNOSIS — E785 Hyperlipidemia, unspecified: Secondary | ICD-10-CM | POA: Diagnosis not present

## 2022-11-09 DIAGNOSIS — R251 Tremor, unspecified: Secondary | ICD-10-CM | POA: Diagnosis not present

## 2022-11-09 DIAGNOSIS — I6529 Occlusion and stenosis of unspecified carotid artery: Secondary | ICD-10-CM | POA: Diagnosis not present

## 2022-11-09 DIAGNOSIS — D631 Anemia in chronic kidney disease: Secondary | ICD-10-CM | POA: Diagnosis not present

## 2022-11-09 DIAGNOSIS — N390 Urinary tract infection, site not specified: Secondary | ICD-10-CM | POA: Diagnosis not present

## 2022-11-10 ENCOUNTER — Other Ambulatory Visit: Payer: Self-pay | Admitting: *Deleted

## 2022-11-10 DIAGNOSIS — I6523 Occlusion and stenosis of bilateral carotid arteries: Secondary | ICD-10-CM

## 2022-11-10 LAB — LAB REPORT - SCANNED
Albumin, Urine POC: 15
Albumin/Creatinine Ratio, Urine, POC: 12
Creatinine, Urine.: 120.8
EGFR: 25

## 2022-11-15 ENCOUNTER — Telehealth: Payer: Self-pay | Admitting: Family Medicine

## 2022-11-15 ENCOUNTER — Encounter: Payer: Self-pay | Admitting: Cardiology

## 2022-11-15 NOTE — Telephone Encounter (Signed)
Appointment had been cancelled and rescheduled.

## 2022-11-15 NOTE — Telephone Encounter (Signed)
Patient called wanting to make Dr. Abner Greenspan aware that she tested positive for covid today. She stated her son has had it for a few days and today she tested positive but she has no symptoms. Pt advised to make appt if she wanted the medication but she stated she would make an appointment if she starts feeling bad.

## 2022-11-16 ENCOUNTER — Ambulatory Visit: Payer: Medicare Other | Admitting: Cardiology

## 2022-11-17 ENCOUNTER — Ambulatory Visit: Payer: Medicare Other | Admitting: Adult Health

## 2022-11-20 ENCOUNTER — Ambulatory Visit: Payer: Medicare Other

## 2022-11-20 ENCOUNTER — Ambulatory Visit (HOSPITAL_COMMUNITY): Payer: Medicare Other

## 2022-11-28 ENCOUNTER — Other Ambulatory Visit: Payer: Self-pay | Admitting: Family Medicine

## 2022-11-28 DIAGNOSIS — E785 Hyperlipidemia, unspecified: Secondary | ICD-10-CM

## 2022-11-28 NOTE — Telephone Encounter (Signed)
Requesting: clonazepam 1mg   Contract:12/30/19 UDS: 12/30/19 Last Visit: 08/14/22 Next Visit: 12/14/22 Last Refill: 08/21/22 #90 and 0RF   Please Advise

## 2022-12-05 ENCOUNTER — Encounter: Payer: Self-pay | Admitting: Adult Health

## 2022-12-05 ENCOUNTER — Ambulatory Visit: Payer: Medicare Other | Admitting: Adult Health

## 2022-12-05 VITALS — BP 126/66 | HR 86 | Temp 98.3°F | Ht 62.0 in | Wt 177.6 lb

## 2022-12-05 DIAGNOSIS — J309 Allergic rhinitis, unspecified: Secondary | ICD-10-CM

## 2022-12-05 DIAGNOSIS — J441 Chronic obstructive pulmonary disease with (acute) exacerbation: Secondary | ICD-10-CM | POA: Diagnosis not present

## 2022-12-05 MED ORDER — AZITHROMYCIN 250 MG PO TABS: ORAL_TABLET | ORAL | 0 refills | Status: AC

## 2022-12-05 NOTE — Patient Instructions (Addendum)
Zpack take as directed.  Mucinex DM Twice daily  As needed  cough/congestion .  Continue on BREZTRI 2 puffs Twice daily, rinse after use.  Take Singulair daily.  Albuterol inhaler As needed   Activity as tolerated .  Positional sleep , work on weight loss.  Work on healthy sleep regimen .  Follow up with Dr. Vassie Loll or Mahsa Hanser NP in 6 months with PFT .

## 2022-12-05 NOTE — Assessment & Plan Note (Signed)
Appears under good control.  Continue on current regimen

## 2022-12-05 NOTE — Assessment & Plan Note (Addendum)
Acute COPD exacerbation after recent COVID-19 infection.-Will treat with empiric antibiotics. Needs repeat PFTs on return visit  Plan  Patient Instructions  Zpack take as directed.  Mucinex DM Twice daily  As needed  cough/congestion .  Continue on BREZTRI 2 puffs Twice daily, rinse after use.  Take Singulair daily.  Albuterol inhaler As needed   Activity as tolerated .  Positional sleep , work on weight loss.  Work on healthy sleep regimen .  Follow up with Dr. Vassie Loll or Olubunmi Rothenberger NP in 6 months with PFT .

## 2022-12-05 NOTE — Progress Notes (Signed)
@Patient  ID: Emily Mitchell, female    DOB: 08-Nov-1945, 77 y.o.   MRN: [id number]  Chief Complaint  Patient presents with   Follow-up    Referring provider: Bradd Canary, MD  HPI: 77 year old female former smoker followed for COPD with asthma Medical history significant for PE in 2014 on Coumadin from 2014 through 2019 History of very mild sleep apnea previously on CPAP came off of it with weight loss. Chronic disease stage IV followed by nephrology, essential tremors and diastolic heart failure  TEST/EVENTS :  PSG at [facility] 08/2011 -217 lbs -AHI 4/h, TST 319 mins,supine    PSG 07/2013 -208 lbs -showed mild OSA TST - 390 mins,  AHI  6/h, RDI of 8/h The lowest desaturation was 87%      Spirometry 04/2013: FEV1 72% predicted FEV1 FVC ratio 73 Spirometry  10/2017  severe airway obstruction with ratio 60, FEV1 of 41% and FVC of 52%    Echo 08/2014 grade 2 DD   12/2014 ONO - no desatn >> dc O2  12/05/2022 Follow up : COPD with Asthma  Patient returns for a 81-month follow-up.  Patient has underlying COPD with asthma.  Says that she had COVID last month.  Has been having lingering cough and congestion since then.  Complains that she still has not gotten her energy level back.  Appetite is fair.  She denies any fever.  Mucus sometimes is thick yellow at times.  She remains on Breztri inhaler twice daily.  Has chronic allergies and is on Singulair daily.  Allergies seem to be under control.  Activity level is at baseline.  Denies any chest pain orthopnea PND or increased leg swelling.     Allergies  Allergen Reactions   Niaspan [Niacin Er] Nausea And Vomiting and Swelling    Swelling in mouth   Pantoprazole     Mouth sores   Rosuvastatin Calcium Anaphylaxis   Sulfonamide Derivatives Hives   Bupropion Other (See Comments)    Uncontrollable shakes   Nitroglycerin     NOT allergic - cardiology has recommended she NOT use this due to h/o severe carotid disease as it may decrease  cerebral perfusion   Penicillins Hives    DID THE REACTION INVOLVE: Swelling of the face/tongue/throat, SOB, or low BP? Yes Sudden or severe rash/hives, skin peeling, or the inside of the mouth or nose? Unknown Did it require medical treatment? Unknown When did it last happen?   [age]  If all above answers are "NO", may proceed with cephalosporin use.   Statins    Apple Rash   Apple Juice Rash   Banana Rash    Immunization History  Administered Date(s) Administered   Fluad Quad(high Dose 65+) 04/17/2019, 04/14/2020, 04/11/2022   Influenza Split 04/29/2013   Influenza, High Dose Seasonal PF 03/30/2016, 04/19/2017, 05/02/2018   Influenza,inj,Quad PF,6+ Mos 03/26/2014, 05/17/2015   Influenza-Unspecified 06/14/2021   PFIZER(Purple Top)SARS-COV-2 Vaccination 10/19/2019, 11/10/2019, 05/08/2020, 10/30/2020   Pfizer Covid-19 Vaccine Bivalent Booster 84yrs & up 05/18/2021   Pneumococcal Conjugate-13 04/29/2013   Pneumococcal Polysaccharide-23 04/30/2014, 04/17/2019   Respiratory Syncytial Virus Vaccine,Recomb Aduvanted(Arexvy) 06/14/2022   Tdap 05/26/2013   Zoster Recombinat (Shingrix) 04/27/2017, 06/13/2017   Zoster, Live 07/31/2012    Past Medical History:  Diagnosis Date   Acid reflux    Allergic rhinitis 05/19/2022   Anemia 07/05/2014   Anxiety and depression 12/14/2008   Qualifier: Diagnosis of  By: Hart Rochester LPN, Raynelle Fanning     Arthritis    "fingers, toes,  knees, joints" (07/18/2018)   Asthma    Atherosclerotic heart disease of native coronary artery without angina pectoris 07/19/2018   Atypical chest pain 05/23/2015   Back pain 02/02/2019   Breast mass, left 04/28/2019   CAD in native artery 07/19/2018   Candidal skin infection 02/28/2018   Carotid artery disease (HCC) 06/16/2015   S/p CEA    Carotid artery occlusion    a. R carotid occluded, 70-99% LICA.   Chest pain 07/18/2018   CHF (congestive heart failure) (HCC)    Chicken pox as a child   Chronic kidney disease     Bright's Disease at age 22 , stage 4   Chronic obstructive pulmonary disease (HCC) 12/14/2008   Formatting of this note might be different from the original. Overview:  Spirometry 05/12/2013: FEV1 72% predicted FEV1 FVC ratio 73 predicted FEF 25 75  43% predicted  Last Assessment & Plan:  OK to stop taking advair Use albuterol as needed only Check oxygen levels during sleep - we will call you with results Call if symptoms worse   Chronic rhinitis 06/14/2018   CKD (chronic kidney disease), stage IV (HCC)    Follows with [facility], Dr Oley Balm    Congestive heart failure (HCC) 11/30/2015   Formatting of this note might be different from the original. Last Assessment & Plan:  No recent exacerbation   Constipation 12/17/2008   Qualifier: Diagnosis of  By: De Blanch    COPD with asthma 11/06/2017   Dry eyes 11/30/2015   Essential hypertension 10/18/2015   Formatting of this note might be different from the original. Last Assessment & Plan:  poorlycontrolled and under a great deal of stress, add Metoprolol XL 25 mg daily. Encouraged heart healthy diet such as the DASH diet and exercise as tolerated.   Essential tremor 11/05/2013   Exudative age-related macular degeneration of right eye with active choroidal neovascularization (HCC) 08/26/2015   Gastro-esophageal reflux disease without esophagitis 12/14/2008   Formatting of this note might be different from the original. Formatting of this note might be different from the original. Formatting of this note might be different from the original. Overview:  Qualifier: Diagnosis of  By: Hart Rochester LPN, Raynelle Fanning   Last Assessment & Plan:  Avoid offending foods, start probiotics. Do not eat large meals in late evening and consider raising head of bed. Last Assessment   GERD (gastroesophageal reflux disease)    H. pylori infection 10/19/2013   H/O multiple allergies 07/14/2018   Helicobacter pylori (H. pylori) as the cause of diseases  classified elsewhere 10/19/2013   Formatting of this note might be different from the original. Last Assessment & Plan:  Tetracycline, Pepto Bismol, Flagyl and Omeprazole.   Hepatitis 1970s   "don't know for sure which one it was; think it was A" (07/18/2018)   History of food allergy 08/14/2018   History of pulmonary embolism 07/17/2018   Hoarseness 03/09/2019   Hypercalcemia 03/09/2019   Hyperglycemia 03/14/2016   Hyperlipidemia, mixed 05/26/2013   Crestor caused N/V Did not tolerate Lipitor    Knee pain, bilateral 12/17/2012   Left hip pain 05/23/2015   Low back pain 09/19/2015   Lower urinary tract infectious disease 10/19/2013   Lymphadenopathy 10/18/2017   Macular degeneration of both eyes 12/16/2015   Right eye is wet Left Eye is dry Shots every 11 to 12 weeks in right eye at opthamologist office   Medicare annual wellness visit, subsequent 02/21/2015   Mixed anxiety depressive disorder  12/14/2008   Formatting of this note might be different from the original. Overview:  Qualifier: Diagnosis of  By: Hart Rochester LPN, Raynelle Fanning  Patient had increased  Tremors on Bupropion  Last Assessment & Plan:  Is struggling with the stress of her 27 year old granddaughter whom she raised moving back with her own mother, the patient is estranged from her daughter so she is very upset. Increase Citalopram 40 mg daily    Mumps 77 yrs old   Neck pain 04/22/2017   Numbness of finger 12/17/2012   Occlusion and stenosis of carotid artery without mention of cerebral infarction 12/17/2012   OSA (obstructive sleep apnea) 12/14/2008   PSG  06/2013-AHI 6/h 08/2011 (bethany) AHI 4/h No longer needs CPAP    Preventative health care 03/14/2016   Pulmonary emboli (HCC) 05/26/2013   Recurrent infection of skin 07/19/2021   Rheumatoid factor positive 09/27/2021   Right shoulder pain 10/18/2017   Sensation of foreign body in larynx 04/08/2019   Skin lesion of back 03/27/2021   Sleep apnea    "don't use CPAP anymore;  dr said I didn't have to" (07/18/2018)   Static tremor 11/05/2013   Formatting of this note might be different from the original. Last Assessment & Plan:  Follows with [facility]   Statin intolerance 09/29/2019   Stroke [facility])    "been told I've had some strokes; didn't know I'd had them" (07/18/2018)   Thyroid disease    Vitamin D deficiency 03/03/2018   Vitreomacular adhesion of both eyes 01/07/2019   Formatting of this note might be different from the original. no traction    Tobacco History: Social History   Tobacco Use  Smoking Status Former   Packs/day: 2.00   Years: 30.00   Additional pack years: 0.00   Total pack years: 60.00   Types: Cigarettes   Quit date: 12/03/1994   Years since quitting: 28.0  Smokeless Tobacco Never  Tobacco Comments   Quit smoking 26 years ago   Counseling given: Not Answered Tobacco comments: Quit smoking 26 years ago   Outpatient Medications Prior to Visit  Medication Sig Dispense Refill   acetaminophen (TYLENOL) 500 MG tablet Take 500 mg by mouth as needed for headache.     albuterol (VENTOLIN HFA) 108 (90 Base) MCG/ACT inhaler Inhale 2 puffs into the lungs every 6 (six) hours as needed for wheezing or shortness of breath. 18 g 3   aspirin EC 81 MG tablet Take 1 tablet (81 mg total) by mouth daily. 90 tablet 3   atorvastatin (LIPITOR) 10 MG tablet TAKE 1 TABLET BY MOUTH EVERY DAY 90 tablet 1   BREZTRI AEROSPHERE 160-9-4.8 MCG/ACT AERO INHALE 2 PUFFS TWICE DAILY IN MORNING AND IN THE EVENING 10.7 each 4   Calcium Citrate-Vitamin D 200-250 MG-UNIT TABS Take 1 tablet by mouth daily.     Cholecalciferol 25 MCG (1000 UT) tablet Take 1,000 Units by mouth daily.     Choline Fenofibrate (FENOFIBRIC ACID) 135 MG CPDR TAKE 1 CAPSULE BY MOUTH EVERY DAY 90 capsule 1   clonazePAM (KLONOPIN) 1 MG tablet TAKE 1 TABLET BY MOUTH 3 TIMES DAILY AS NEEDED. FOR ANXIETY 90 tablet 0   cyanocobalamin 100 MCG tablet Take 500 mcg by mouth daily.     cyclobenzaprine  (FLEXERIL) 10 MG tablet Take 1 tablet (10 mg total) by mouth at bedtime as needed. 30 tablet 1   esomeprazole (NEXIUM) 40 MG capsule Take 1 capsule (40 mg total) by mouth daily. 90 capsule  1   ezetimibe (ZETIA) 10 MG tablet Take 1 tablet (10 mg total) by mouth daily. 90 tablet 1   famotidine (PEPCID) 40 MG tablet Take 1 tablet (40 mg total) by mouth daily. 90 tablet 1   FLUoxetine (PROZAC) 20 MG tablet Take 1 tablet (20 mg total) by mouth daily. 90 tablet 1   gabapentin (NEURONTIN) 100 MG capsule TAKE 2 CAPSULES BY MOUTH AT BEDTIME 180 capsule 1   levothyroxine (SYNTHROID) 100 MCG tablet TAKE 1 TABLET BY MOUTH EVERY DAY 90 tablet 2   montelukast (SINGULAIR) 10 MG tablet Take 1 tablet (10 mg total) by mouth at bedtime as needed. 90 tablet 1   NON FORMULARY Take 1 tablet by mouth daily. Digestive advantage.     Omega-3 Fatty Acids (FISH OIL PO) Take 1 capsule by mouth daily.     Probiotic Product (VSL#3) CAPS Take 1 capsule by mouth daily.     No facility-administered medications prior to visit.     Review of Systems:   Constitutional:   No  weight loss, night sweats,  Fevers, chills,  +fatigue, or  lassitude.  HEENT:   No headaches,  Difficulty swallowing,  Tooth/dental problems, or  Sore throat,                No sneezing, itching, ear ache, nasal congestion, post nasal drip,   CV:  No chest pain,  Orthopnea, PND, swelling in lower extremities, anasarca, dizziness, palpitations, syncope.   GI  No heartburn, indigestion, abdominal pain, nausea, vomiting, diarrhea, change in bowel habits, loss of appetite, bloody stools.   Resp:   No chest wall deformity  Skin: no rash or lesions.  GU: no dysuria, change in color of urine, no urgency or frequency.  No flank pain, no hematuria   MS:  No joint pain or swelling.  No decreased range of motion.  No back pain.    Physical Exam  BP 126/66 (BP Location: Left Arm, Patient Position: Sitting, Cuff Size: Large)   Pulse 86   Temp 98.3 F  (36.8 C) (Oral)   Ht 5\' 2"  (1.575 m)   Wt 177 lb 9.6 oz (80.6 kg)   SpO2 97%   BMI 32.48 kg/m   GEN: A/Ox3; pleasant , NAD, well nourished    HEENT:  [place]/AT,  NOSE-clear, THROAT-clear, no lesions, no postnasal drip or exudate noted.   NECK:  Supple w/ fair ROM; no JVD; normal carotid impulses w/o bruits; no thyromegaly or nodules palpated; no lymphadenopathy.    RESP  Clear  P & A; w/o, wheezes/ rales/ or rhonchi. no accessory muscle use, no dullness to percussion  CARD:  RRR, no m/r/g, no peripheral edema, pulses intact, no cyanosis or clubbing.  GI:   Soft & nt; nml bowel sounds; no organomegaly or masses detected.   Musco: Warm bil, no deformities or joint swelling noted.   Neuro: alert, no focal deficits noted.    Skin: Warm, no lesions or rashes    Lab Results:  CBC  BMET   Imaging: No results found.        No data to display          No results found for: "NITRICOXIDE"      Assessment & Plan:   Chronic obstructive pulmonary disease (HCC) Acute COPD exacerbation after recent COVID-19 infection.-Will treat with empiric antibiotics. Needs repeat PFTs on return visit  Plan  Patient Instructions  Zpack take as directed.  Mucinex DM Twice daily  As needed  cough/congestion .  Continue on BREZTRI 2 puffs Twice daily, rinse after use.  Take Singulair daily.  Albuterol inhaler As needed   Activity as tolerated .  Positional sleep , work on weight loss.  Work on healthy sleep regimen .  Follow up with Dr. Vassie Loll or Jeanmarie Mccowen NP in 6 months with PFT .      Allergic rhinitis Appears under good control.  Continue on current regimen     Rubye Oaks, NP 12/05/2022

## 2022-12-14 ENCOUNTER — Encounter: Payer: Medicare Other | Admitting: Family Medicine

## 2022-12-21 DIAGNOSIS — Z961 Presence of intraocular lens: Secondary | ICD-10-CM | POA: Diagnosis not present

## 2022-12-21 DIAGNOSIS — H43822 Vitreomacular adhesion, left eye: Secondary | ICD-10-CM | POA: Diagnosis not present

## 2022-12-21 DIAGNOSIS — H35342 Macular cyst, hole, or pseudohole, left eye: Secondary | ICD-10-CM | POA: Diagnosis not present

## 2022-12-21 DIAGNOSIS — H353211 Exudative age-related macular degeneration, right eye, with active choroidal neovascularization: Secondary | ICD-10-CM | POA: Diagnosis not present

## 2022-12-21 DIAGNOSIS — H353222 Exudative age-related macular degeneration, left eye, with inactive choroidal neovascularization: Secondary | ICD-10-CM | POA: Diagnosis not present

## 2022-12-28 ENCOUNTER — Other Ambulatory Visit: Payer: Self-pay | Admitting: Family Medicine

## 2022-12-29 NOTE — Progress Notes (Unsigned)
HISTORY AND PHYSICAL     CC:  follow up. Requesting Provider:  Bradd Canary, MD  HPI: This is a 77 y.o. female here for follow up for carotid artery stenosis.  She has known right occluded carotid artery and 40-59% left ICA stenosis.    Pt was last seen 10/24/2021 and at that time she was not having any neurological sx, rest pain, claudication, or non healing wounds. She was riding a stationary bike for about 2 miles/day without discomfort.   Pt returns today for follow up.    Pt denies any amaurosis fugax, speech difficulties, weakness, numbness, paralysis or clumsiness or facial droop.   She states she had a big fall back in January when walking up 4 steps.  Once she was going to step up to the top, she fell backwards.  Since then, she has pain in her left neck and she feels her left arm is weaker.  She does not have any issues with right side.    She states that she was riding her stationary bike for about 4 miles but this broke and she has not been doing that.  She states she needs a new one.  She is compliant with her statin and asa.    In March, she had twin great grand babies (boy and girl)  The pt is on a statin for cholesterol management.  The pt is on a daily aspirin.   Other AC:  none The pt is not on medication for hypertension.   The pt is not on medication for diabetes Tobacco hx:  former  Pt does not have family hx of AAA.  Past Medical History:  Diagnosis Date   Acid reflux    Allergic rhinitis 05/19/2022   Anemia 07/05/2014   Anxiety and depression 12/14/2008   Qualifier: Diagnosis of  By: Hart Rochester LPN, Raynelle Fanning     Arthritis    "fingers, toes, knees, joints" (07/18/2018)   Asthma    Atherosclerotic heart disease of native coronary artery without angina pectoris 07/19/2018   Atypical chest pain 05/23/2015   Back pain 02/02/2019   Breast mass, left 04/28/2019   CAD in native artery 07/19/2018   Candidal skin infection 02/28/2018   Carotid artery disease (HCC)  06/16/2015   S/p CEA    Carotid artery occlusion    a. R carotid occluded, 70-99% LICA.   Chest pain 07/18/2018   CHF (congestive heart failure) (HCC)    Chicken pox as a child   Chronic kidney disease    Bright's Disease at age 13 , stage 4   Chronic obstructive pulmonary disease (HCC) 12/14/2008   Formatting of this note might be different from the original. Overview:  Spirometry 05/12/2013: FEV1 72% predicted FEV1 FVC ratio 73 predicted FEF 25 75  43% predicted  Last Assessment & Plan:  OK to stop taking advair Use albuterol as needed only Check oxygen levels during sleep - we will call you with results Call if symptoms worse   Chronic rhinitis 06/14/2018   CKD (chronic kidney disease), stage IV (HCC)    Follows with [facility], Dr Oley Balm    Congestive heart failure (HCC) 11/30/2015   Formatting of this note might be different from the original. Last Assessment & Plan:  No recent exacerbation   Constipation 12/17/2008   Qualifier: Diagnosis of  By: De Blanch    COPD with asthma 11/06/2017   Dry eyes 11/30/2015   Essential hypertension 10/18/2015   Formatting of  this note might be different from the original. Last Assessment & Plan:  poorlycontrolled and under a great deal of stress, add Metoprolol XL 25 mg daily. Encouraged heart healthy diet such as the DASH diet and exercise as tolerated.   Essential tremor 11/05/2013   Exudative age-related macular degeneration of right eye with active choroidal neovascularization (HCC) 08/26/2015   Gastro-esophageal reflux disease without esophagitis 12/14/2008   Formatting of this note might be different from the original. Formatting of this note might be different from the original. Formatting of this note might be different from the original. Overview:  Qualifier: Diagnosis of  By: Hart Rochester LPN, Raynelle Fanning   Last Assessment & Plan:  Avoid offending foods, start probiotics. Do not eat large meals in late evening and consider  raising head of bed. Last Assessment   GERD (gastroesophageal reflux disease)    H. pylori infection 10/19/2013   H/O multiple allergies 07/14/2018   Helicobacter pylori (H. pylori) as the cause of diseases classified elsewhere 10/19/2013   Formatting of this note might be different from the original. Last Assessment & Plan:  Tetracycline, Pepto Bismol, Flagyl and Omeprazole.   Hepatitis 1970s   "don't know for sure which one it was; think it was A" (07/18/2018)   History of food allergy 08/14/2018   History of pulmonary embolism 07/17/2018   Hoarseness 03/09/2019   Hypercalcemia 03/09/2019   Hyperglycemia 03/14/2016   Hyperlipidemia, mixed 05/26/2013   Crestor caused N/V Did not tolerate Lipitor    Knee pain, bilateral 12/17/2012   Left hip pain 05/23/2015   Low back pain 09/19/2015   Lower urinary tract infectious disease 10/19/2013   Lymphadenopathy 10/18/2017   Macular degeneration of both eyes 12/16/2015   Right eye is wet Left Eye is dry Shots every 11 to 12 weeks in right eye at opthamologist office   Medicare annual wellness visit, subsequent 02/21/2015   Mixed anxiety depressive disorder 12/14/2008   Formatting of this note might be different from the original. Overview:  Qualifier: Diagnosis of  By: Hart Rochester LPN, Raynelle Fanning  Patient had increased  Tremors on Bupropion  Last Assessment & Plan:  Is struggling with the stress of her 75 year old granddaughter whom she raised moving back with her own mother, the patient is estranged from her daughter so she is very upset. Increase Citalopram 40 mg daily    Mumps 77 yrs old   Neck pain 04/22/2017   Numbness of finger 12/17/2012   Occlusion and stenosis of carotid artery without mention of cerebral infarction 12/17/2012   OSA (obstructive sleep apnea) 12/14/2008   PSG  06/2013-AHI 6/h 08/2011 (bethany) AHI 4/h No longer needs CPAP    Preventative health care 03/14/2016   Pulmonary emboli (HCC) 05/26/2013   Recurrent infection of skin  07/19/2021   Rheumatoid factor positive 09/27/2021   Right shoulder pain 10/18/2017   Sensation of foreign body in larynx 04/08/2019   Skin lesion of back 03/27/2021   Sleep apnea    "don't use CPAP anymore; dr said I didn't have to" (07/18/2018)   Static tremor 11/05/2013   Formatting of this note might be different from the original. Last Assessment & Plan:  Follows with [facility]   Statin intolerance 09/29/2019   Stroke [facility])    "been told I've had some strokes; didn't know I'd had them" (07/18/2018)   Thyroid disease    Vitamin D deficiency 03/03/2018   Vitreomacular adhesion of both eyes 01/07/2019   Formatting of this note might be  different from the original. no traction    Past Surgical History:  Procedure Laterality Date   APPENDECTOMY  1984   CARDIAC CATHETERIZATION  ~ 2006   CAROTID ANGIOGRAPHY Left 08/20/2018   Procedure: CAROTID ANGIOGRAPHY;  Surgeon: Nada Libman, MD;  Location: [facility];  Service: Cardiovascular;  Laterality: Left;   CAROTID ENDARTERECTOMY Right 05/10/2007   cea   CATARACT EXTRACTION Left    CATARACT EXTRACTION W/ INTRAOCULAR LENS  IMPLANT, BILATERAL Bilateral    COLONOSCOPY  09/25/2008   normal   COLONOSCOPY WITH PROPOFOL  02/13/2022   Lynann Bologna at [facility]   CORONARY ANGIOPLASTY WITH STENT PLACEMENT  07/18/2018   CORONARY PRESSURE/FFR STUDY N/A 07/18/2018   Procedure: INTRAVASCULAR PRESSURE WIRE/FFR STUDY;  Surgeon: Runell Gess, MD;  Location: [facility];  Service: Cardiovascular;  Laterality: N/A;   CORONARY STENT INTERVENTION N/A 07/18/2018   Procedure: CORONARY STENT INTERVENTION;  Surgeon: Runell Gess, MD;  Location: [facility];  Service: Cardiovascular;  Laterality: N/A;   JOINT REPLACEMENT     LEFT HEART CATH AND CORONARY ANGIOGRAPHY N/A 07/18/2018   Procedure: LEFT HEART CATH AND CORONARY ANGIOGRAPHY;  Surgeon: Runell Gess, MD;  Location: [facility];  Service: Cardiovascular;   Laterality: N/A;   MULTIPLE TOOTH EXTRACTIONS     TOTAL KNEE ARTHROPLASTY Bilateral 2005 - 2011   right - left   VAGINAL HYSTERECTOMY  1984   WISDOM TOOTH EXTRACTION      Allergies  Allergen Reactions   Niaspan [Niacin Er] Nausea And Vomiting and Swelling    Swelling in mouth   Pantoprazole     Mouth sores   Rosuvastatin Calcium Anaphylaxis   Sulfonamide Derivatives Hives   Bupropion Other (See Comments)    Uncontrollable shakes   Nitroglycerin     NOT allergic - cardiology has recommended she NOT use this due to h/o severe carotid disease as it may decrease cerebral perfusion   Penicillins Hives    DID THE REACTION INVOLVE: Swelling of the face/tongue/throat, SOB, or low BP? Yes Sudden or severe rash/hives, skin peeling, or the inside of the mouth or nose? Unknown Did it require medical treatment? Unknown When did it last happen?   [age]  If all above answers are "NO", may proceed with cephalosporin use.   Statins    Apple Rash   Apple Juice Rash   Banana Rash    Current Outpatient Medications  Medication Sig Dispense Refill   acetaminophen (TYLENOL) 500 MG tablet Take 500 mg by mouth as needed for headache.     albuterol (VENTOLIN HFA) 108 (90 Base) MCG/ACT inhaler Inhale 2 puffs into the lungs every 6 (six) hours as needed for wheezing or shortness of breath. 18 g 3   aspirin EC 81 MG tablet Take 1 tablet (81 mg total) by mouth daily. 90 tablet 3   atorvastatin (LIPITOR) 10 MG tablet TAKE 1 TABLET BY MOUTH EVERY DAY 90 tablet 1   BREZTRI AEROSPHERE 160-9-4.8 MCG/ACT AERO INHALE 2 PUFFS TWICE DAILY IN MORNING AND IN THE EVENING 10.7 each 4   Calcium Citrate-Vitamin D 200-250 MG-UNIT TABS Take 1 tablet by mouth daily.     Cholecalciferol 25 MCG (1000 UT) tablet Take 1,000 Units by mouth daily.     Choline Fenofibrate (FENOFIBRIC ACID) 135 MG CPDR TAKE 1 CAPSULE BY MOUTH EVERY DAY 90 capsule 1   clonazePAM (KLONOPIN) 1 MG tablet TAKE 1 TABLET BY MOUTH 3 TIMES DAILY AS NEEDED.  FOR ANXIETY 90 tablet 0   cyanocobalamin 100 MCG tablet Take 500 mcg by mouth daily.     cyclobenzaprine (FLEXERIL) 10 MG tablet Take 1 tablet (10 mg total) by mouth at bedtime as needed. 30 tablet 1   esomeprazole (NEXIUM) 40 MG capsule Take 1 capsule (40 mg total) by mouth daily. 90 capsule 1   ezetimibe (ZETIA) 10 MG tablet Take 1 tablet (10 mg total) by mouth daily. 90 tablet 1   famotidine (PEPCID) 40 MG tablet Take 1 tablet (40 mg total) by mouth daily. 90 tablet 1   FLUoxetine (PROZAC) 20 MG tablet Take 1 tablet (20 mg total) by mouth daily. 90 tablet 1   gabapentin (NEURONTIN) 100 MG capsule TAKE 2 CAPSULES BY MOUTH AT BEDTIME 180 capsule 1   levothyroxine (SYNTHROID) 100 MCG tablet TAKE 1 TABLET BY MOUTH EVERY DAY 90 tablet 2   montelukast (SINGULAIR) 10 MG tablet Take 1 tablet (10 mg total) by mouth at bedtime as needed. 90 tablet 1   NON FORMULARY Take 1 tablet by mouth daily. Digestive advantage.     Omega-3 Fatty Acids (FISH OIL PO) Take 1 capsule by mouth daily.     Probiotic Product (VSL#3) CAPS Take 1 capsule by mouth daily.     No current facility-administered medications for this visit.    Family History  Problem Relation Age of Onset   Stroke Mother        mini stroke   Kidney disease Mother    Heart failure Mother    Hypertension Mother    Diabetes Sister        type 2   Kidney disease Sister    Allergic rhinitis Sister    Osteoarthritis Sister    Hyperlipidemia Sister    Heart attack Maternal Grandfather    Colon cancer Neg Hx    Stomach cancer Neg Hx    Rectal cancer Neg Hx     Social History   Socioeconomic History   Marital status: Divorced    Spouse name: Not on file   Number of children: 2   Years of education: Not on file   Highest education level: Not on file  Occupational History   Not on file  Tobacco Use   Smoking status: Former    Packs/day: 2.00    Years: 30.00    Additional pack years: 0.00    Total pack years: 60.00    Types:  Cigarettes    Quit date: 12/03/1994    Years since quitting: 28.0   Smokeless tobacco: Never   Tobacco comments:    Quit smoking 26 years ago  Vaping Use   Vaping Use: Never used  Substance and Sexual Activity   Alcohol use: Yes    Comment: special occasions   Drug use: Never   Sexual activity: Not Currently    Birth control/protection: Post-menopausal    Comment: lives with son and friend, no dietary restrictions  Other Topics Concern   Not on file  Social History Narrative   Right handed   No caffeine   One story home   Social Determinants of Health   Financial Resource Strain: Low Risk  (03/24/2021)   Overall Financial Resource Strain (CARDIA)    Difficulty of Paying Living Expenses: Not hard at all  Food Insecurity: No Food Insecurity (03/24/2021)   Hunger Vital Sign    Worried About Running Out of Food in the Last Year: Never true    Ran Out of Food in the  Last Year: Never true  Transportation Needs: No Transportation Needs (03/24/2021)   PRAPARE - Administrator, Civil Service (Medical): No    Lack of Transportation (Non-Medical): No  Physical Activity: Insufficiently Active (03/24/2021)   Exercise Vital Sign    Days of Exercise per Week: 7 days    Minutes of Exercise per Session: 20 min  Stress: No Stress Concern Present (03/24/2021)   [employer] of Occupational Health - Occupational Stress Questionnaire    Feeling of Stress : Not at all  Social Connections: Socially Isolated (03/24/2021)   Social Connection and Isolation Panel [NHANES]    Frequency of Communication with Friends and Family: More than three times a week    Frequency of Social Gatherings with Friends and Family: More than three times a week    Attends Religious Services: Never    Database administrator or Organizations: No    Attends Banker Meetings: Never    Marital Status: Divorced  Catering manager Violence: Not At Risk (03/24/2021)   Humiliation, Afraid, Rape, and  Kick questionnaire    Fear of Current or Ex-Partner: No    Emotionally Abused: No    Physically Abused: No    Sexually Abused: No     REVIEW OF SYSTEMS:   [X]  denotes positive finding, [ ]  denotes negative finding Cardiac  Comments:  Chest pain or chest pressure:    Shortness of breath upon exertion:    Short of breath when lying flat:    Irregular heart rhythm:        Vascular    Pain in calf, thigh, or hip brought on by ambulation:    Pain in feet at night that wakes you up from your sleep:     Blood clot in your veins:    Leg swelling:         Pulmonary    Oxygen at home:    Productive cough:     Wheezing:         Neurologic    Sudden weakness in arms or legs:     Sudden numbness in arms or legs:     Sudden onset of difficulty speaking or slurred speech:    Temporary loss of vision in one eye:     Problems with dizziness:         Gastrointestinal    Blood in stool:     Vomited blood:         Genitourinary    Burning when urinating:     Blood in urine:        Psychiatric    Major depression:         Hematologic    Bleeding problems:    Problems with blood clotting too easily:        Skin    Rashes or ulcers:        Constitutional    Fever or chills:      PHYSICAL EXAMINATION:   General:  WDWN in NAD; vital signs documented above Gait: Not observed HENT: WNL, normocephalic Pulmonary: normal non-labored breathing Cardiac: regular HR, without carotid bruits Abdomen: soft, NT; aortic pulse is not palpable Skin: without rashes Vascular Exam/Pulses:  Right Left  Radial 2+ (normal) 2+ (normal)  PT 2+ (normal) 2+ (normal)   Extremities: without open wounds Musculoskeletal: no muscle wasting or atrophy  Neurologic: A&O X 3; moving all extremities equally; speech is fluent/normal Psychiatric:  The pt has Normal affect.   Non-Invasive Vascular  Imaging:   Carotid Duplex on 01/01/2023 Right:  occluded Left:  40-59% ICA stenosis   Previous Carotid  duplex on 10/24/2021: Right: occluded Left:   40-59% ICA stenosis    ASSESSMENT/PLAN:: 77 y.o. female here for follow up carotid artery stenosis and she has known right occluded carotid artery and 40-59% left ICA stenosis.    -duplex today reveals right ICA remains occluded.  She has had some left neck and left arm pain since the fall.  Given her right ICA has been occluded for years, this is most likely due to her fall.  Her left ICA stenosis remains in the 40-59% range.   -discussed s/s of stroke with pt and she understands should she develop any of these sx, she will go to the nearest ER or call 911. -pt will f/u in one year with carotid duplex -pt will call sooner should she have any issues. -continue statin/asa    Doreatha Massed, [facility] [phone]  Clinic MD:  Randie Heinz on call MD

## 2023-01-01 ENCOUNTER — Encounter: Payer: Self-pay | Admitting: Physician Assistant

## 2023-01-01 ENCOUNTER — Ambulatory Visit: Payer: Medicare Other | Admitting: Physician Assistant

## 2023-01-01 ENCOUNTER — Ambulatory Visit (HOSPITAL_COMMUNITY)
Admission: RE | Admit: 2023-01-01 | Discharge: 2023-01-01 | Disposition: A | Discharge status: Home / Self Care | Payer: Medicare Other | Source: Ambulatory Visit | Attending: Physician Assistant | Admitting: Physician Assistant

## 2023-01-01 DIAGNOSIS — N1832 Chronic kidney disease, stage 3b: Secondary | ICD-10-CM | POA: Diagnosis not present

## 2023-01-01 DIAGNOSIS — I6521 Occlusion and stenosis of right carotid artery: Secondary | ICD-10-CM | POA: Diagnosis not present

## 2023-01-01 DIAGNOSIS — I6523 Occlusion and stenosis of bilateral carotid arteries: Secondary | ICD-10-CM | POA: Diagnosis not present

## 2023-01-01 DIAGNOSIS — I6522 Occlusion and stenosis of left carotid artery: Secondary | ICD-10-CM

## 2023-01-02 ENCOUNTER — Other Ambulatory Visit: Payer: Self-pay

## 2023-01-02 DIAGNOSIS — I6523 Occlusion and stenosis of bilateral carotid arteries: Secondary | ICD-10-CM

## 2023-01-03 ENCOUNTER — Encounter: Payer: Self-pay | Admitting: Family Medicine

## 2023-01-03 ENCOUNTER — Ambulatory Visit (INDEPENDENT_AMBULATORY_CARE_PROVIDER_SITE_OTHER): Payer: Medicare Other | Admitting: Family Medicine

## 2023-01-03 VITALS — BP 128/80 | HR 82 | Ht 62.0 in | Wt 175.0 lb

## 2023-01-03 DIAGNOSIS — G250 Essential tremor: Secondary | ICD-10-CM

## 2023-01-03 DIAGNOSIS — J4489 Other specified chronic obstructive pulmonary disease: Secondary | ICD-10-CM | POA: Diagnosis not present

## 2023-01-03 DIAGNOSIS — Z0000 Encounter for general adult medical examination without abnormal findings: Secondary | ICD-10-CM

## 2023-01-03 DIAGNOSIS — R739 Hyperglycemia, unspecified: Secondary | ICD-10-CM | POA: Diagnosis not present

## 2023-01-03 DIAGNOSIS — E782 Mixed hyperlipidemia: Secondary | ICD-10-CM | POA: Diagnosis not present

## 2023-01-03 DIAGNOSIS — Z79899 Other long term (current) drug therapy: Secondary | ICD-10-CM | POA: Diagnosis not present

## 2023-01-03 DIAGNOSIS — E559 Vitamin D deficiency, unspecified: Secondary | ICD-10-CM | POA: Diagnosis not present

## 2023-01-03 DIAGNOSIS — E079 Disorder of thyroid, unspecified: Secondary | ICD-10-CM | POA: Diagnosis not present

## 2023-01-03 NOTE — Assessment & Plan Note (Signed)
Previously controlled  Medication management: Zetia, Omega-3 Lifestyle factors for lowering cholesterol include: Diet therapy - heart-healthy diet rich in fruits, veggies, fiber-rich whole grains, lean meats, chicken, fish (at least twice a week), fat-free or 1% dairy products; foods low in saturated/trans fats, cholesterol, sodium, and sugar. Mediterranean diet has shown to be very heart healthy. Regular exercise - recommend at least 30 minutes a day, 5 times per week Weight management  Repeat CMP and lipid panel today

## 2023-01-03 NOTE — Progress Notes (Signed)
Complete physical exam  Patient: Emily Mitchell   DOB: August 14, 1945   77 y.o. Female  MRN: [id number]  Subjective:    CC:CPE   Emily Mitchell is a 77 y.o. female who presents today for a complete physical exam. She reports consuming a general diet. Home exercise routine includes stationary bike. She generally feels well. She reports sleeping well. She does not have additional problems to discuss today.   Currently lives with: son Acute concerns or interim problems since last visit:  -States she recently had COVID (early April) and is still noticing she gets winded very easily and has to take frequent breaks. Still coughing occasionally, but not usually productive. She has not been needing her albuterol. She is taking her Breztri.  -She also would like a new neuro referral for her tremors. States she tried to see her former neurologist Teacher, English as a foreign language), but it had been more than 3 years and they needed a new referral.    Patient denies ETOH use. Patient denies nicotine use. Patient denies illegal substance use.         Most recent fall risk assessment:    08/14/2022    1:17 PM  Fall Risk   Falls in the past year? 1  Number falls in past yr: 1  Injury with Fall? 1  Follow up Falls evaluation completed     Most recent depression screenings:    08/14/2022    1:18 PM 04/19/2022    3:55 PM  PHQ 2/9 Scores  PHQ - 2 Score 0 0            Patient Care Team: Bradd Canary, MD as PCP - General (Family Medicine) Revankar, Aundra Dubin, MD as PCP - Cardiology (Cardiology) Oretha Milch, MD as Consulting Physician (Pulmonary Disease) Terrial Rhodes, MD as Consulting Physician (Nephrology) Tat, Octaviano Batty, DO as Consulting Physician (Neurology) Arnetha Gula, Lucious Groves, MD as Consulting Physician (Ophthalmology)   Outpatient Medications Prior to Visit  Medication Sig   acetaminophen (TYLENOL) 500 MG tablet Take 500 mg by mouth as needed for headache.   albuterol (VENTOLIN HFA)  108 (90 Base) MCG/ACT inhaler Inhale 2 puffs into the lungs every 6 (six) hours as needed for wheezing or shortness of breath.   aspirin EC 81 MG tablet Take 1 tablet (81 mg total) by mouth daily.   atorvastatin (LIPITOR) 10 MG tablet TAKE 1 TABLET BY MOUTH EVERY DAY   BREZTRI AEROSPHERE 160-9-4.8 MCG/ACT AERO INHALE 2 PUFFS TWICE DAILY IN MORNING AND IN THE EVENING   Calcium Citrate-Vitamin D 200-250 MG-UNIT TABS Take 1 tablet by mouth daily.   Cholecalciferol 25 MCG (1000 UT) tablet Take 1,000 Units by mouth daily.   Choline Fenofibrate (FENOFIBRIC ACID) 135 MG CPDR TAKE 1 CAPSULE BY MOUTH EVERY DAY   clonazePAM (KLONOPIN) 1 MG tablet TAKE 1 TABLET BY MOUTH 3 TIMES DAILY AS NEEDED. FOR ANXIETY   cyanocobalamin 100 MCG tablet Take 500 mcg by mouth daily.   cyclobenzaprine (FLEXERIL) 10 MG tablet Take 1 tablet (10 mg total) by mouth at bedtime as needed.   ezetimibe (ZETIA) 10 MG tablet Take 1 tablet (10 mg total) by mouth daily.   famotidine (PEPCID) 40 MG tablet Take 1 tablet (40 mg total) by mouth daily.   FLUoxetine (PROZAC) 20 MG tablet Take 1 tablet (20 mg total) by mouth daily.   gabapentin (NEURONTIN) 100 MG capsule TAKE 2 CAPSULES BY MOUTH AT BEDTIME   levothyroxine (SYNTHROID) 100 MCG tablet TAKE 1 TABLET  BY MOUTH EVERY DAY   montelukast (SINGULAIR) 10 MG tablet Take 1 tablet (10 mg total) by mouth at bedtime as needed.   NON FORMULARY Take 1 tablet by mouth daily. Digestive advantage.   Omega-3 Fatty Acids (FISH OIL PO) Take 1 capsule by mouth daily.   Probiotic Product (VSL#3) CAPS Take 1 capsule by mouth daily.   [DISCONTINUED] esomeprazole (NEXIUM) 40 MG capsule Take 1 capsule (40 mg total) by mouth daily.   No facility-administered medications prior to visit.    ROS All review of systems negative except what is listed in the HPI        Objective:     BP 128/80   Pulse 82   Ht 5\' 2"  (1.575 m)   Wt 175 lb (79.4 kg)   SpO2 97%   BMI 32.01 kg/m    Physical  Exam Vitals reviewed.  Constitutional:      General: She is not in acute distress.    Appearance: Normal appearance. She is not ill-appearing.  HENT:     Head: Normocephalic and atraumatic.     Right Ear: Tympanic membrane normal.     Left Ear: Tympanic membrane normal.     Nose: Nose normal.     Mouth/Throat:     Mouth: Mucous membranes are moist.     Pharynx: Oropharynx is clear.  Eyes:     Extraocular Movements: Extraocular movements intact.     Conjunctiva/sclera: Conjunctivae normal.     Pupils: Pupils are equal, round, and reactive to light.  Cardiovascular:     Rate and Rhythm: Normal rate and regular rhythm.     Pulses: Normal pulses.     Heart sounds: Normal heart sounds.  Pulmonary:     Effort: Pulmonary effort is normal.     Breath sounds: Normal breath sounds.  Abdominal:     General: Abdomen is flat. Bowel sounds are normal. There is no distension.     Palpations: Abdomen is soft. There is no mass.     Tenderness: There is no abdominal tenderness. There is no right CVA tenderness, left CVA tenderness, guarding or rebound.  Genitourinary:    Comments: Deferred exam Musculoskeletal:        General: Normal range of motion.     Cervical back: Normal range of motion and neck supple. No tenderness.     Right lower leg: No edema.     Left lower leg: No edema.  Lymphadenopathy:     Cervical: No cervical adenopathy.  Skin:    General: Skin is warm and dry.     Capillary Refill: Capillary refill takes less than 2 seconds.  Neurological:     General: No focal deficit present.     Mental Status: She is alert and oriented to person, place, and time. Mental status is at baseline.  Psychiatric:        Mood and Affect: Mood normal.        Behavior: Behavior normal.        Thought Content: Thought content normal.        Judgment: Judgment normal.         No results found for any visits on 01/03/23.     Assessment & Plan:    Routine Health Maintenance and  Physical Exam Discussed health promotion and safety including diet and exercise recommendations, dental health, and injury prevention. Tobacco cessation if applicable. Seat belts, sunscreen, smoke detectors, etc.    Immunization History  Administered Date(s) Administered  Fluad Quad(high Dose 65+) 04/17/2019, 04/14/2020, 04/11/2022   Influenza Split 04/29/2013   Influenza, High Dose Seasonal PF 03/30/2016, 04/19/2017, 05/02/2018   Influenza,inj,Quad PF,6+ Mos 03/26/2014, 05/17/2015   Influenza-Unspecified 06/14/2021   PFIZER(Purple Top)SARS-COV-2 Vaccination 10/19/2019, 11/10/2019, 05/08/2020, 10/30/2020   Pfizer Covid-19 Vaccine Bivalent Booster 43yrs & up 05/18/2021   Pneumococcal Conjugate-13 04/29/2013   Pneumococcal Polysaccharide-23 04/30/2014, 04/17/2019   Respiratory Syncytial Virus Vaccine,Recomb Aduvanted(Arexvy) 06/14/2022   Tdap 05/26/2013   Zoster Recombinat (Shingrix) 04/27/2017, 06/13/2017   Zoster, Live 07/31/2012    Health Maintenance  Topic Date Due   COVID-19 Vaccine (6 - 2023-24 season) 02/23/2023 (Originally 03/31/2022)   INFLUENZA VACCINE  03/01/2023   Medicare Annual Wellness (AWV)  04/20/2023   DTaP/Tdap/Td (2 - Td or Tdap) 05/27/2023   MAMMOGRAM  05/29/2023   Colonoscopy  02/13/2025   Pneumonia Vaccine 35+ Years old  Completed   DEXA SCAN  Completed   Hepatitis C Screening  Completed   Zoster Vaccines- Shingrix  Completed   HPV VACCINES  Aged Out        Problem List Items Addressed This Visit     Thyroid disease    Previously stable. Labs today. Continue levothyroxine      Relevant Orders   Comprehensive metabolic panel   TSH   Hyperlipidemia, mixed    Previously controlled  Medication management: Zetia, Omega-3 Lifestyle factors for lowering cholesterol include: Diet therapy - heart-healthy diet rich in fruits, veggies, fiber-rich whole grains, lean meats, chicken, fish (at least twice a week), fat-free or 1% dairy products; foods low in  saturated/trans fats, cholesterol, sodium, and sugar. Mediterranean diet has shown to be very heart healthy. Regular exercise - recommend at least 30 minutes a day, 5 times per week Weight management  Repeat CMP and lipid panel today       Relevant Orders   Lipid panel   Essential tremor    New neuro referral placed - they will call you to schedule      Relevant Orders   Ambulatory referral to Neurology   Hyperglycemia    Controlled. Last A1c 5.6%. Repeat labs today. Continue healthy lifestyle       Relevant Orders   Hemoglobin A1c   COPD with asthma    Stable overall. It is not uncommon for occasional cough and decreased stamina to linger after COVID, however if your symptoms worsen, you begin coughing up colored sputum, running fevers, having significant shortness of breath, etc, you need to be evaluated.       Vitamin D deficiency    Previously stable. Labs today.       Relevant Orders   VITAMIN D 25 Hydroxy (Vit-D Deficiency, Fractures)   Other Visit Diagnoses     High risk medication use    -  Primary   Relevant Orders   DRUG MONITORING PANEL [phone] , URINE   Annual physical exam       Relevant Orders   CBC with Differential/Platelet   Comprehensive metabolic panel   Hemoglobin A1c   TSH   Lipid panel   VITAMIN D 25 Hydroxy (Vit-D Deficiency, Fractures)      Return in about 3 months (around 04/05/2023) for routine follow-up.     Clayborne Dana, NP

## 2023-01-03 NOTE — Assessment & Plan Note (Signed)
Controlled. Last A1c 5.6%. Repeat labs today. Continue healthy lifestyle

## 2023-01-03 NOTE — Assessment & Plan Note (Signed)
Previously stable. Labs today.

## 2023-01-03 NOTE — Assessment & Plan Note (Signed)
New neuro referral placed - they will call you to schedule

## 2023-01-03 NOTE — Patient Instructions (Signed)
New neuro referral placed - they will call you to schedule It is not uncommon for occasional cough and decreased stamina to linger after COVID, however if your symptoms worsen, you begin coughing up colored sputum, running fevers, having significant shortness of breath, etc, you need to be evaluated.

## 2023-01-03 NOTE — Assessment & Plan Note (Signed)
Previously stable. Labs today. Continue levothyroxine

## 2023-01-03 NOTE — Assessment & Plan Note (Signed)
Stable overall. It is not uncommon for occasional cough and decreased stamina to linger after COVID, however if your symptoms worsen, you begin coughing up colored sputum, running fevers, having significant shortness of breath, etc, you need to be evaluated.

## 2023-01-04 LAB — CBC WITH DIFFERENTIAL/PLATELET
Basophils Absolute: 0.1 10*3/uL (ref 0.0–0.1)
Basophils Relative: 1.2 % (ref 0.0–3.0)
Eosinophils Absolute: 0.2 10*3/uL (ref 0.0–0.7)
Eosinophils Relative: 2.2 % (ref 0.0–5.0)
HCT: 38.4 % (ref 36.0–46.0)
Hemoglobin: 12.4 g/dL (ref 12.0–15.0)
Lymphocytes Relative: 23.4 % (ref 12.0–46.0)
Lymphs Abs: 2 10*3/uL (ref 0.7–4.0)
MCHC: 32.4 g/dL (ref 30.0–36.0)
MCV: 91.7 fl (ref 78.0–100.0)
Monocytes Absolute: 0.7 10*3/uL (ref 0.1–1.0)
Monocytes Relative: 7.9 % (ref 3.0–12.0)
Neutro Abs: 5.7 10*3/uL (ref 1.4–7.7)
Neutrophils Relative %: 65.3 % (ref 43.0–77.0)
Platelets: 405 10*3/uL — ABNORMAL HIGH (ref 150.0–400.0)
RBC: 4.19 Mil/uL (ref 3.87–5.11)
RDW: 14.8 % (ref 11.5–15.5)
WBC: 8.8 10*3/uL (ref 4.0–10.5)

## 2023-01-04 LAB — COMPREHENSIVE METABOLIC PANEL
ALT: 15 U/L (ref 0–35)
AST: 21 U/L (ref 0–37)
Albumin: 4 g/dL (ref 3.5–5.2)
Alkaline Phosphatase: 49 U/L (ref 39–117)
BUN: 37 mg/dL — ABNORMAL HIGH (ref 6–23)
CO2: 27 mEq/L (ref 19–32)
Calcium: 9.4 mg/dL (ref 8.4–10.5)
Chloride: 106 mEq/L (ref 96–112)
Creatinine, Ser: 1.82 mg/dL — ABNORMAL HIGH (ref 0.40–1.20)
GFR: 26.99 mL/min — ABNORMAL LOW (ref >60.00)
Glucose, Bld: 75 mg/dL (ref 70–99)
Potassium: 5 mEq/L (ref 3.5–5.1)
Sodium: 143 mEq/L (ref 135–145)
Total Bilirubin: 0.6 mg/dL (ref 0.2–1.2)
Total Protein: 6.8 g/dL (ref 6.0–8.3)

## 2023-01-04 LAB — HEMOGLOBIN A1C: Hgb A1c MFr Bld: 5.4 % (ref 4.6–6.5)

## 2023-01-04 LAB — TSH: TSH: 0.94 u[IU]/mL (ref 0.35–5.50)

## 2023-01-04 LAB — LIPID PANEL
Cholesterol: 132 mg/dL (ref 0–200)
HDL: 39 mg/dL — ABNORMAL LOW (ref >39.00)
LDL Cholesterol: 55 mg/dL (ref 0–99)
NonHDL: 93.02
Total CHOL/HDL Ratio: 3
Triglycerides: 188 mg/dL — ABNORMAL HIGH (ref 0.0–149.0)
VLDL: 37.6 mg/dL (ref 0.0–40.0)

## 2023-01-04 LAB — VITAMIN D 25 HYDROXY (VIT D DEFICIENCY, FRACTURES): VITD: 73.15 ng/mL (ref 30.00–100.00)

## 2023-01-05 LAB — DRUG MONITORING PANEL 375977 , URINE
Alcohol Metabolites: NEGATIVE ng/mL (ref <500)
Alphahydroxyalprazolam: NEGATIVE ng/mL (ref <25)
Alphahydroxymidazolam: NEGATIVE ng/mL (ref <50)
Alphahydroxytriazolam: NEGATIVE ng/mL (ref <50)
Aminoclonazepam: 793 ng/mL — ABNORMAL HIGH (ref <25)
Amphetamines: NEGATIVE ng/mL (ref <500)
Barbiturates: NEGATIVE ng/mL (ref <300)
Benzodiazepines: POSITIVE ng/mL — AB (ref <100)
Cocaine Metabolite: NEGATIVE ng/mL (ref <150)
Desmethyltramadol: NEGATIVE ng/mL (ref <100)
Hydroxyethylflurazepam: NEGATIVE ng/mL (ref <50)
Lorazepam: NEGATIVE ng/mL (ref <50)
Marijuana Metabolite: NEGATIVE ng/mL (ref <20)
Nordiazepam: NEGATIVE ng/mL (ref <50)
Opiates: NEGATIVE ng/mL (ref <100)
Oxazepam: NEGATIVE ng/mL (ref <50)
Oxycodone: NEGATIVE ng/mL (ref <100)
Temazepam: NEGATIVE ng/mL (ref <50)
Tramadol: NEGATIVE ng/mL (ref <100)

## 2023-01-05 LAB — DM TEMPLATE

## 2023-01-19 ENCOUNTER — Encounter: Payer: Self-pay | Admitting: Cardiology

## 2023-01-19 ENCOUNTER — Encounter: Payer: Self-pay | Admitting: Neurology

## 2023-01-19 ENCOUNTER — Ambulatory Visit: Payer: Medicare Other | Attending: Cardiology | Admitting: Cardiology

## 2023-01-19 VITALS — BP 106/70 | HR 80 | Ht 62.0 in | Wt 176.1 lb

## 2023-01-19 DIAGNOSIS — G4733 Obstructive sleep apnea (adult) (pediatric): Secondary | ICD-10-CM

## 2023-01-19 DIAGNOSIS — E782 Mixed hyperlipidemia: Secondary | ICD-10-CM

## 2023-01-19 DIAGNOSIS — I6529 Occlusion and stenosis of unspecified carotid artery: Secondary | ICD-10-CM | POA: Diagnosis not present

## 2023-01-19 DIAGNOSIS — I2510 Atherosclerotic heart disease of native coronary artery without angina pectoris: Secondary | ICD-10-CM

## 2023-01-19 DIAGNOSIS — I10 Essential (primary) hypertension: Secondary | ICD-10-CM | POA: Diagnosis not present

## 2023-01-19 DIAGNOSIS — Z86711 Personal history of pulmonary embolism: Secondary | ICD-10-CM

## 2023-01-19 NOTE — Patient Instructions (Signed)
Medication Instructions:  Your physician recommends that you continue on your current medications as directed. Please refer to the Current Medication list given to you today.  *If you need a refill on your cardiac medications before your next appointment, please call your pharmacy*   Lab Work: None ordered If you have labs (blood work) drawn today and your tests are completely normal, you will receive your results only by: MyChart Message (if you have MyChart) OR A paper copy in the mail If you have any lab test that is abnormal or we need to change your treatment, we will call you to review the results.   Testing/Procedures: None ordered   Follow-Up: At [facility], you and your health needs are our priority.  As part of our continuing mission to provide you with exceptional heart care, we have created designated Provider Care Teams.  These Care Teams include your primary Cardiologist (physician) and Advanced Practice Providers (APPs -  Physician Assistants and Nurse Practitioners) who all work together to provide you with the care you need, when you need it.  We recommend signing up for the patient portal called "MyChart".  Sign up information is provided on this After Visit Summary.  MyChart is used to connect with patients for Virtual Visits (Telemedicine).  Patients are able to view lab/test results, encounter notes, upcoming appointments, etc.  Non-urgent messages can be sent to your provider as well.   To learn more about what you can do with MyChart, go to [email or web address].    Your next appointment:   9 month(s)  The format for your next appointment:   In Person  Provider:   Rajan Revankar, MD    Other Instructions none  Important Information About Sugar       

## 2023-01-19 NOTE — Progress Notes (Signed)
Cardiology Office Note:    Date:  01/19/2023   ID:  Emily Mitchell, DOB 03/21/1946, MRN [id number]  PCP:  Bradd Canary, MD  Cardiologist:  Garwin Brothers, MD   Referring MD: Bradd Canary, MD    ASSESSMENT:    1. Essential hypertension   2. Occlusion of carotid artery, unspecified laterality   3. Atherosclerosis of native coronary artery of native heart without angina pectoris   4. OSA (obstructive sleep apnea)   5. History of pulmonary embolism   6. Hyperlipidemia, mixed    PLAN:    In order of problems listed above:  Coronary artery disease and atherosclerotic vascular disease: Secondary prevention stressed with the patient.  Importance of compliance with diet medication stressed and she vocalized understanding.  She was advised to ambulate to the best of her ability.  Fall precautions were mentioned. Hypertension: Blood pressure stable and diet was emphasized.  Her blood pressure is borderline and she was encouraged to keep a track of it.  She will send [place] the blood pressure log. Mixed dyslipidemia: On lipid-lowering medications and followed by primary care.  Lipids were reviewed and discussed with the patient at length they are satisfactory. Obesity: Reduction stressed and she promises to do better.  Sleep health issues were discussed. Patient will be seen in follow-up appointment in 9 months or earlier if the patient has any concerns.   Medication Adjustments/Labs and Tests Ordered: Current medicines are reviewed at length with the patient today.  Concerns regarding medicines are outlined above.  No orders of the defined types were placed in this encounter.  No orders of the defined types were placed in this encounter.    No chief complaint on file.    History of Present Illness:    Emily Mitchell is a 77 y.o. female.  Patient has past medical history of atherosclerotic vascular disease, carotid atherosclerosis, subclavian artery stenosis and mixed  dyslipidemia.  She denies any problems at this time and takes his daily living.  No chest pain orthopnea or PND.  She does admit that her gait is markedly stable and she has stopped primary care about this.  Past Medical History:  Diagnosis Date   Acid reflux    Allergic rhinitis 05/19/2022   Anemia 07/05/2014   Anxiety and depression 12/14/2008   Qualifier: Diagnosis of  By: Hart Rochester LPN, Raynelle Fanning     Arthritis    "fingers, toes, knees, joints" (07/18/2018)   Asthma    Atherosclerotic heart disease of native coronary artery without angina pectoris 07/19/2018   Atypical chest pain 05/23/2015   Back pain 02/02/2019   Breast mass, left 04/28/2019   CAD in native artery 07/19/2018   Candidal skin infection 02/28/2018   Carotid artery disease (HCC) 06/16/2015   S/p CEA    Carotid artery occlusion    a. R carotid occluded, 70-99% LICA.   Chest pain 07/18/2018   CHF (congestive heart failure) (HCC)    Chicken pox as a child   Chronic kidney disease    Bright's Disease at age 89 , stage 4   Chronic obstructive pulmonary disease (HCC) 12/14/2008   Formatting of this note might be different from the original. Overview:  Spirometry 05/12/2013: FEV1 72% predicted FEV1 FVC ratio 73 predicted FEF 25 75  43% predicted  Last Assessment & Plan:  OK to stop taking advair Use albuterol as needed only Check oxygen levels during sleep - we will call you with results Call if symptoms worse  Chronic rhinitis 06/14/2018   CKD (chronic kidney disease), stage IV (HCC)    Follows with [facility], Dr Oley Balm    Congestive heart failure (HCC) 11/30/2015   Formatting of this note might be different from the original. Last Assessment & Plan:  No recent exacerbation   Constipation 12/17/2008   Qualifier: Diagnosis of  By: De Blanch    COPD with asthma 11/06/2017   Dry eyes 11/30/2015   Essential hypertension 10/18/2015   Formatting of this note might be different from the original. Last  Assessment & Plan:  poorlycontrolled and under a great deal of stress, add Metoprolol XL 25 mg daily. Encouraged heart healthy diet such as the DASH diet and exercise as tolerated.   Essential tremor 11/05/2013   Exudative age-related macular degeneration of right eye with active choroidal neovascularization (HCC) 08/26/2015   Gastro-esophageal reflux disease without esophagitis 12/14/2008   Formatting of this note might be different from the original. Formatting of this note might be different from the original. Formatting of this note might be different from the original. Overview:  Qualifier: Diagnosis of  By: Hart Rochester LPN, Raynelle Fanning   Last Assessment & Plan:  Avoid offending foods, start probiotics. Do not eat large meals in late evening and consider raising head of bed. Last Assessment   GERD (gastroesophageal reflux disease)    H. pylori infection 10/19/2013   H/O multiple allergies 07/14/2018   Helicobacter pylori (H. pylori) as the cause of diseases classified elsewhere 10/19/2013   Formatting of this note might be different from the original. Last Assessment & Plan:  Tetracycline, Pepto Bismol, Flagyl and Omeprazole.   Hepatitis 1970s   "don't know for sure which one it was; think it was A" (07/18/2018)   History of food allergy 08/14/2018   History of pulmonary embolism 07/17/2018   Hoarseness 03/09/2019   Hypercalcemia 03/09/2019   Hyperglycemia 03/14/2016   Hyperlipidemia, mixed 05/26/2013   Crestor caused N/V Did not tolerate Lipitor    Knee pain, bilateral 12/17/2012   Left hip pain 05/23/2015   Low back pain 09/19/2015   Lower urinary tract infectious disease 10/19/2013   Lymphadenopathy 10/18/2017   Macular degeneration of both eyes 12/16/2015   Right eye is wet Left Eye is dry Shots every 11 to 12 weeks in right eye at opthamologist office   Medicare annual wellness visit, subsequent 02/21/2015   Mixed anxiety depressive disorder 12/14/2008   Formatting of this note might be  different from the original. Overview:  Qualifier: Diagnosis of  By: Hart Rochester LPN, Raynelle Fanning  Patient had increased  Tremors on Bupropion  Last Assessment & Plan:  Is struggling with the stress of her 51 year old granddaughter whom she raised moving back with her own mother, the patient is estranged from her daughter so she is very upset. Increase Citalopram 40 mg daily    Mumps 77 yrs old   Neck pain 04/22/2017   Numbness of finger 12/17/2012   Occlusion and stenosis of carotid artery without mention of cerebral infarction 12/17/2012   OSA (obstructive sleep apnea) 12/14/2008   PSG  06/2013-AHI 6/h 08/2011 (bethany) AHI 4/h No longer needs CPAP    Preventative health care 03/14/2016   Pulmonary emboli (HCC) 05/26/2013   Recurrent infection of skin 07/19/2021   Rheumatoid factor positive 09/27/2021   Right shoulder pain 10/18/2017   Sensation of foreign body in larynx 04/08/2019   Skin lesion of back 03/27/2021   Sleep apnea    "don't use  CPAP anymore; dr said I didn't have to" (07/18/2018)   Static tremor 11/05/2013   Formatting of this note might be different from the original. Last Assessment & Plan:  Follows with [facility]   Statin intolerance 09/29/2019   Stroke [facility])    "been told I've had some strokes; didn't know I'd had them" (07/18/2018)   Thyroid disease    Vitamin D deficiency 03/03/2018   Vitreomacular adhesion of both eyes 01/07/2019   Formatting of this note might be different from the original. no traction    Past Surgical History:  Procedure Laterality Date   APPENDECTOMY  1984   CARDIAC CATHETERIZATION  ~ 2006   CAROTID ANGIOGRAPHY Left 08/20/2018   Procedure: CAROTID ANGIOGRAPHY;  Surgeon: Nada Libman, MD;  Location: [facility];  Service: Cardiovascular;  Laterality: Left;   CAROTID ENDARTERECTOMY Right 05/10/2007   cea   CATARACT EXTRACTION Left    CATARACT EXTRACTION W/ INTRAOCULAR LENS  IMPLANT, BILATERAL Bilateral    COLONOSCOPY  09/25/2008    normal   COLONOSCOPY WITH PROPOFOL  02/13/2022   [facility] Bologna at [facility]   CORONARY ANGIOPLASTY WITH STENT PLACEMENT  07/18/2018   CORONARY PRESSURE/FFR STUDY N/A 07/18/2018   Procedure: INTRAVASCULAR PRESSURE WIRE/FFR STUDY;  Surgeon: Runell Gess, MD;  Location: [facility];  Service: Cardiovascular;  Laterality: N/A;   CORONARY STENT INTERVENTION N/A 07/18/2018   Procedure: CORONARY STENT INTERVENTION;  Surgeon: Runell Gess, MD;  Location: [facility];  Service: Cardiovascular;  Laterality: N/A;   JOINT REPLACEMENT     LEFT HEART CATH AND CORONARY ANGIOGRAPHY N/A 07/18/2018   Procedure: LEFT HEART CATH AND CORONARY ANGIOGRAPHY;  Surgeon: Runell Gess, MD;  Location: [facility];  Service: Cardiovascular;  Laterality: N/A;   MULTIPLE TOOTH EXTRACTIONS     TOTAL KNEE ARTHROPLASTY Bilateral 2005 - 2011   right - left   VAGINAL HYSTERECTOMY  1984   WISDOM TOOTH EXTRACTION      Current Medications: Current Meds  Medication Sig   acetaminophen (TYLENOL) 500 MG tablet Take 500 mg by mouth as needed for headache.   albuterol (VENTOLIN HFA) 108 (90 Base) MCG/ACT inhaler Inhale 2 puffs into the lungs every 6 (six) hours as needed for wheezing or shortness of breath.   aspirin EC 81 MG tablet Take 1 tablet (81 mg total) by mouth daily.   atorvastatin (LIPITOR) 10 MG tablet TAKE 1 TABLET BY MOUTH EVERY DAY   BREZTRI AEROSPHERE 160-9-4.8 MCG/ACT AERO INHALE 2 PUFFS TWICE DAILY IN MORNING AND IN THE EVENING   Calcium Citrate-Vitamin D 200-250 MG-UNIT TABS Take 1 tablet by mouth daily.   Cholecalciferol 25 MCG (1000 UT) tablet Take 1,000 Units by mouth daily.   Choline Fenofibrate (FENOFIBRIC ACID) 135 MG CPDR TAKE 1 CAPSULE BY MOUTH EVERY DAY   clonazePAM (KLONOPIN) 1 MG tablet TAKE 1 TABLET BY MOUTH 3 TIMES DAILY AS NEEDED. FOR ANXIETY   cyanocobalamin 100 MCG tablet Take 500 mcg by mouth daily.   cyclobenzaprine (FLEXERIL) 10 MG tablet Take 1 tablet (10 mg  total) by mouth at bedtime as needed.   ezetimibe (ZETIA) 10 MG tablet Take 1 tablet (10 mg total) by mouth daily.   famotidine (PEPCID) 40 MG tablet Take 1 tablet (40 mg total) by mouth daily.   FLUoxetine (PROZAC) 20 MG tablet Take 1 tablet (20 mg total) by mouth daily.   gabapentin (NEURONTIN) 100 MG capsule TAKE 2 CAPSULES BY MOUTH AT BEDTIME  levothyroxine (SYNTHROID) 100 MCG tablet TAKE 1 TABLET BY MOUTH EVERY DAY   montelukast (SINGULAIR) 10 MG tablet Take 1 tablet (10 mg total) by mouth at bedtime as needed.   NON FORMULARY Take 1 tablet by mouth daily. Digestive advantage.   Omega-3 Fatty Acids (FISH OIL PO) Take 1 capsule by mouth daily.   Probiotic Product (VSL#3) CAPS Take 1 capsule by mouth daily.     Allergies:   Niaspan [niacin er], Pantoprazole, Rosuvastatin calcium, Sulfonamide derivatives, Bupropion, Nitroglycerin, Penicillins, Statins, Apple, Apple juice, and Banana   Social History   Socioeconomic History   Marital status: Divorced    Spouse name: Not on file   Number of children: 2   Years of education: Not on file   Highest education level: Not on file  Occupational History   Not on file  Tobacco Use   Smoking status: Former    Packs/day: 2.00    Years: 30.00    Additional pack years: 0.00    Total pack years: 60.00    Types: Cigarettes    Quit date: 12/03/1994    Years since quitting: 28.1   Smokeless tobacco: Never   Tobacco comments:    Quit smoking 26 years ago  Vaping Use   Vaping Use: Never used  Substance and Sexual Activity   Alcohol use: Yes    Comment: special occasions   Drug use: Never   Sexual activity: Not Currently    Birth control/protection: Post-menopausal    Comment: lives with son and friend, no dietary restrictions  Other Topics Concern   Not on file  Social History Narrative   Right handed   No caffeine   One story home   Social Determinants of Health   Financial Resource Strain: Low Risk  (03/24/2021)   Overall  Financial Resource Strain (CARDIA)    Difficulty of Paying Living Expenses: Not hard at all  Food Insecurity: No Food Insecurity (03/24/2021)   Hunger Vital Sign    Worried About Running Out of Food in the Last Year: Never true    Ran Out of Food in the Last Year: Never true  Transportation Needs: No Transportation Needs (03/24/2021)   PRAPARE - Administrator, Civil Service (Medical): No    Lack of Transportation (Non-Medical): No  Physical Activity: Insufficiently Active (03/24/2021)   Exercise Vital Sign    Days of Exercise per Week: 7 days    Minutes of Exercise per Session: 20 min  Stress: No Stress Concern Present (03/24/2021)   [employer] of Occupational Health - Occupational Stress Questionnaire    Feeling of Stress : Not at all  Social Connections: Socially Isolated (03/24/2021)   Social Connection and Isolation Panel [NHANES]    Frequency of Communication with Friends and Family: More than three times a week    Frequency of Social Gatherings with Friends and Family: More than three times a week    Attends Religious Services: Never    Database administrator or Organizations: No    Attends Engineer, structural: Never    Marital Status: Divorced     Family History: The patient's family history includes Allergic rhinitis in her sister; Diabetes in her sister; Heart attack in her maternal grandfather; Heart failure in her mother; Hyperlipidemia in her sister; Hypertension in her mother; Kidney disease in her mother and sister; Osteoarthritis in her sister; Stroke in her mother. There is no history of Colon cancer, Stomach cancer, or Rectal cancer.  ROS:  Please see the history of present illness.    All other systems reviewed and are negative.  EKGs/Labs/Other Studies Reviewed:    The following studies were reviewed today: I discussed my findings with the patient at length   Recent Labs: 08/14/2022: Magnesium 2.1 01/03/2023: ALT 15; BUN 37;  Creatinine, Ser 1.82; Hemoglobin 12.4; Platelets 405.0; Potassium 5.0; Sodium 143; TSH 0.94  Recent Lipid Panel    Component Value Date/Time   CHOL 132 01/03/2023 1407   CHOL 143 04/12/2020 1137   TRIG 188.0 (H) 01/03/2023 1407   HDL 39.00 (L) 01/03/2023 1407   HDL 44 04/12/2020 1137   CHOLHDL 3 01/03/2023 1407   VLDL 37.6 01/03/2023 1407   LDLCALC 55 01/03/2023 1407   LDLCALC 71 04/12/2020 1137   LDLDIRECT 103.0 07/14/2019 1323    Physical Exam:    VS:  BP 106/70   Pulse 80   Ht 5\' 2"  (1.575 m)   Wt 176 lb 1.3 oz (79.9 kg)   SpO2 94%   BMI 32.21 kg/m     Wt Readings from Last 3 Encounters:  01/19/23 176 lb 1.3 oz (79.9 kg)  01/03/23 175 lb (79.4 kg)  12/05/22 177 lb 9.6 oz (80.6 kg)     GEN: Patient is in no acute distress HEENT: Normal NECK: No JVD; No carotid bruits LYMPHATICS: No lymphadenopathy CARDIAC: Hear sounds regular, 2/6 systolic murmur at the apex. RESPIRATORY:  Clear to auscultation without rales, wheezing or rhonchi  ABDOMEN: Soft, non-tender, non-distended MUSCULOSKELETAL:  No edema; No deformity  SKIN: Warm and dry NEUROLOGIC:  Alert and oriented x 3 PSYCHIATRIC:  Normal affect   Signed, Garwin Brothers, MD  01/19/2023 9:56 AM    [facility]

## 2023-01-24 ENCOUNTER — Other Ambulatory Visit: Payer: Self-pay | Admitting: Family Medicine

## 2023-02-22 DIAGNOSIS — H353211 Exudative age-related macular degeneration, right eye, with active choroidal neovascularization: Secondary | ICD-10-CM | POA: Diagnosis not present

## 2023-02-22 DIAGNOSIS — H43822 Vitreomacular adhesion, left eye: Secondary | ICD-10-CM | POA: Diagnosis not present

## 2023-02-22 DIAGNOSIS — H35342 Macular cyst, hole, or pseudohole, left eye: Secondary | ICD-10-CM | POA: Diagnosis not present

## 2023-02-22 DIAGNOSIS — Z961 Presence of intraocular lens: Secondary | ICD-10-CM | POA: Diagnosis not present

## 2023-02-22 DIAGNOSIS — H353222 Exudative age-related macular degeneration, left eye, with inactive choroidal neovascularization: Secondary | ICD-10-CM | POA: Diagnosis not present

## 2023-02-22 DIAGNOSIS — H35363 Drusen (degenerative) of macula, bilateral: Secondary | ICD-10-CM | POA: Diagnosis not present

## 2023-02-23 ENCOUNTER — Other Ambulatory Visit: Payer: Self-pay | Admitting: Pulmonary Disease

## 2023-02-23 ENCOUNTER — Other Ambulatory Visit: Payer: Self-pay | Admitting: Family Medicine

## 2023-02-26 NOTE — Telephone Encounter (Signed)
Requesting: clonazepam 1mg   Contract: 01/03/23 UDS: 01/03/23 Last Visit: 01/03/23 w/ Ladona Ridgel Next Visit: 04/05/23 Last Refill: 11/28/22 #90 and 9GE  Please Advise

## 2023-02-26 NOTE — Telephone Encounter (Signed)
Pt called to check status of med refill. Advised it has been sent back to Dr. Abner Greenspan and we are waited for her to send it in. Pt stated that she Is currently out of this medication and needs a refill when possible.

## 2023-02-27 ENCOUNTER — Ambulatory Visit: Payer: Medicare Other | Admitting: Neurology

## 2023-03-14 ENCOUNTER — Telehealth: Payer: Self-pay

## 2023-03-14 ENCOUNTER — Ambulatory Visit (HOSPITAL_BASED_OUTPATIENT_CLINIC_OR_DEPARTMENT_OTHER)
Admission: RE | Admit: 2023-03-14 | Discharge: 2023-03-14 | Disposition: A | Discharge status: Home / Self Care | Payer: Medicare Other | Source: Ambulatory Visit | Attending: Physician Assistant | Admitting: Physician Assistant

## 2023-03-14 ENCOUNTER — Encounter: Payer: Self-pay | Admitting: Physician Assistant

## 2023-03-14 ENCOUNTER — Ambulatory Visit (INDEPENDENT_AMBULATORY_CARE_PROVIDER_SITE_OTHER): Payer: Medicare Other | Admitting: Physician Assistant

## 2023-03-14 ENCOUNTER — Telehealth: Payer: Self-pay | Admitting: Family Medicine

## 2023-03-14 VITALS — BP 142/76 | HR 75 | Temp 97.8°F | Resp 20 | Wt 180.0 lb

## 2023-03-14 DIAGNOSIS — W19XXXA Unspecified fall, initial encounter: Secondary | ICD-10-CM | POA: Diagnosis not present

## 2023-03-14 DIAGNOSIS — M25512 Pain in left shoulder: Secondary | ICD-10-CM

## 2023-03-14 DIAGNOSIS — M79632 Pain in left forearm: Secondary | ICD-10-CM | POA: Insufficient documentation

## 2023-03-14 DIAGNOSIS — Z043 Encounter for examination and observation following other accident: Secondary | ICD-10-CM | POA: Diagnosis not present

## 2023-03-14 MED ORDER — TRAMADOL HCL 50 MG PO TABS
50.0000 mg | ORAL_TABLET | Freq: Two times a day (BID) | ORAL | 0 refills | Status: AC
Start: 2023-03-14 — End: 2023-03-19

## 2023-03-14 NOTE — Telephone Encounter (Signed)
The patient called and reported a recent fall where she hit her head and is now experiencing pain in her shoulder and down her arm. I connected her with a triage nurse for further assistance.

## 2023-03-14 NOTE — Telephone Encounter (Signed)
Triage Nurse Eunice Blase called regarding pt having  Shoulder pain and pain.Pt scheduled for appt today with  Lillia Abed, [place].

## 2023-03-14 NOTE — Telephone Encounter (Signed)
Called pt been seen today at 3.

## 2023-03-14 NOTE — Progress Notes (Signed)
Established patient visit   Patient: Emily Mitchell   DOB: 02-08-1946   77 y.o. Female  MRN: [id number] Visit Date: 03/14/2023  Today's healthcare provider: Alfredia Ferguson, PA-C   Cc. left shoulder pain   Subjective    HPI  Pt reports a fall 01/11/23, she was looking up, and then bent down to put a stool she was standing on away, and lost her balance. Reports falling on her left shoulder and had a bruise on her right forearm.   Today, still having significant left shoulder pain, very limited ROM. Reports paresthesias down the left arm. She was taking some old tramadol for pain. Medications: Outpatient Medications Prior to Visit  Medication Sig   acetaminophen (TYLENOL) 500 MG tablet Take 500 mg by mouth as needed for headache.   albuterol (VENTOLIN HFA) 108 (90 Base) MCG/ACT inhaler Inhale 2 puffs into the lungs every 6 (six) hours as needed for wheezing or shortness of breath.   aspirin EC 81 MG tablet Take 1 tablet (81 mg total) by mouth daily.   atorvastatin (LIPITOR) 10 MG tablet TAKE 1 TABLET BY MOUTH EVERY DAY   BREZTRI AEROSPHERE 160-9-4.8 MCG/ACT AERO INHALE 2 PUFFS TWICE DAILY IN MORNING AND IN THE EVENING   Calcium Citrate-Vitamin D 200-250 MG-UNIT TABS Take 1 tablet by mouth daily.   Cholecalciferol 25 MCG (1000 UT) tablet Take 1,000 Units by mouth daily.   Choline Fenofibrate (FENOFIBRIC ACID) 135 MG CPDR TAKE 1 CAPSULE BY MOUTH EVERY DAY   clonazePAM (KLONOPIN) 1 MG tablet TAKE 1 TABLET BY MOUTH 3 TIMES DAILY AS NEEDED. FOR ANXIETY   cyanocobalamin 100 MCG tablet Take 500 mcg by mouth daily.   cyclobenzaprine (FLEXERIL) 10 MG tablet Take 1 tablet (10 mg total) by mouth at bedtime as needed.   ezetimibe (ZETIA) 10 MG tablet Take 1 tablet (10 mg total) by mouth daily.   famotidine (PEPCID) 40 MG tablet TAKE 1 TABLET BY MOUTH EVERY DAY   FLUoxetine (PROZAC) 20 MG tablet Take 1 tablet (20 mg total) by mouth daily.   gabapentin (NEURONTIN) 100 MG capsule TAKE 2  CAPSULES BY MOUTH AT BEDTIME   levothyroxine (SYNTHROID) 100 MCG tablet TAKE 1 TABLET BY MOUTH EVERY DAY   montelukast (SINGULAIR) 10 MG tablet Take 1 tablet (10 mg total) by mouth at bedtime as needed.   NON FORMULARY Take 1 tablet by mouth daily. Digestive advantage.   Omega-3 Fatty Acids (FISH OIL PO) Take 1 capsule by mouth daily.   Probiotic Product (VSL#3) CAPS Take 1 capsule by mouth daily.   No facility-administered medications prior to visit.    Review of Systems  Constitutional:  Negative for fatigue and fever.  Respiratory:  Negative for cough and shortness of breath.   Cardiovascular:  Negative for chest pain and leg swelling.  Gastrointestinal:  Negative for abdominal pain.  Musculoskeletal:  Positive for arthralgias.  Neurological:  Negative for dizziness and headaches.      Objective    BP (!) 142/76 (BP Location: Right Arm, Patient Position: Sitting, Cuff Size: Normal)   Pulse 75   Temp 97.8 F (36.6 C)   Resp 20   Wt 180 lb (81.6 kg)   SpO2 98%   BMI 32.92 kg/m   Physical Exam Vitals reviewed.  Constitutional:      Appearance: She is not ill-appearing.  HENT:     Head: Normocephalic.  Eyes:     Conjunctiva/sclera: Conjunctivae normal.  Cardiovascular:  Rate and Rhythm: Normal rate.  Pulmonary:     Effort: Pulmonary effort is normal. No respiratory distress.  Musculoskeletal:     Comments: No edema, ecchymosis. Significant tenderness to L shoulder joint, very limited flexion otherwise no ROM of the left shoulder 2/2 pain.  Neurological:     General: No focal deficit present.     Mental Status: She is alert and oriented to person, place, and time.  Psychiatric:        Mood and Affect: Mood normal.        Behavior: Behavior normal.      No results found for any visits on 03/14/23.  Assessment & Plan     1. Acute pain of left shoulder Concerned over fracture  Ordered xrays, sent in a few days of tramadol for pain relief. If imaging normal,  will refer to ortho for MRI r/o rotator cuff injury.  - DG Shoulder Left; Future - DG Forearm Left; Future - traMADol (ULTRAM) 50 MG tablet; Take 1 tablet (50 mg total) by mouth 2 (two) times daily for 5 days.  Dispense: 10 tablet; Refill: 0  2. Fall, initial encounter Cautioned patient against quick up and down movements, that this is often a trigger to causing loss of balance.  - DG Shoulder Left; Future - DG Forearm Left; Future   Return if symptoms worsen or fail to improve.      I, Alfredia Ferguson, PA-C have reviewed all documentation for this visit. The documentation on  03/14/23   for the exam, diagnosis, procedures, and orders are all accurate and complete.    Alfredia Ferguson, PA-C  [facility] [phone] (phone) [phone] (fax)  [facility]

## 2023-03-15 ENCOUNTER — Encounter (INDEPENDENT_AMBULATORY_CARE_PROVIDER_SITE_OTHER): Payer: Self-pay

## 2023-03-15 ENCOUNTER — Telehealth: Payer: Self-pay

## 2023-03-15 NOTE — Telephone Encounter (Signed)
Pt was seen yesterday 03/14/23.   Patient Name Emily Mitchell Gender: Female DOB: 1945/08/12 Client [facility] Day - Client Client Site [facility] - Day Provider Danise Edge - MD Contact Type Call Who Is Calling Patient / Member / Family / Caregiver Call Type Triage / Clinical Relationship To Patient Self Return Phone Number [phone] (Primary) Chief Complaint Tingling Reason for Call Symptomatic / Request for Health Information Initial Comment Caller states she was told to speak to an advice nurse. She has falling twice since January. She would like the doctor to give her some relief. She hurt her left shoulder when she fell back in January. Her arm is hurting and tingling Translation No Nurse Assessment Nurse: Clarita Leber, RN, Gavin Pound Date/Time (Eastern Time): 03/14/2023 11:52:53 AM Confirm and document reason for call. If symptomatic, describe symptoms. ---The caller states that she fell twice since January. Then she went to the ED. Then she fell again and hurt her left shoulder. States that she is having pain in her left shoulder blade and tingling with pain that goes to the elbow. Was not seen with this accident and that she thinks was June. Does the patient have any new or worsening symptoms? ---Yes Will a triage be completed? ---Yes Related visit to physician within the last 2 weeks? ---No Does the PT have any chronic conditions? (i.e. diabetes, asthma, this includes High risk factors for pregnancy, etc.) ---Yes List chronic conditions. ---stent in her heart, kidney disease (age 54 had Bright's disease) Is this a behavioral health or substance abuse call? ---No Guidelines Guideline Title Affirmed Question Affirmed Notes Nurse Date/Time (Eastern Time) Shoulder Pain [1] Unable to use arm at all AND [2] because of shoulder pain or stiffness Womble, RN, Gavin Pound 03/14/2023 12:00:08 PM PLEASE NOTE: All timestamps contained  within this report are represented as [place] Standard Time. CONFIDENTIALTY NOTICE: This fax transmission is intended only for the addressee. It contains information that is legally privileged, confidential or otherwise protected from use or disclosure. If you are not the intended recipient, you are strictly prohibited from reviewing, disclosing, copying using or disseminating any of this information or taking any action in reliance on or regarding this information. If you have received this fax in error, please notify us immediately by telephone so that we can arrange for its return to [place]. Phone: [phone], Toll-Free: [phone], Fax: [phone] Page: 2 of 2 Call Id: [id number] Disp. Time [facility] Time) Disposition Final User 03/14/2023 12:09:43 PM See PCP within 24 Hours Yes Clarita Leber, RN, Gavin Pound Final Disposition 03/14/2023 12:09:43 PM See PCP within 24 Hours Yes Clarita Leber, RN, Jetty Duhamel Disagree/Comply Comply Caller Understands Yes PreDisposition Call Doctor Care Advice Given Per Guideline SEE PCP WITHIN 24 HOURS: * You become worse

## 2023-03-20 DIAGNOSIS — N1832 Chronic kidney disease, stage 3b: Secondary | ICD-10-CM | POA: Diagnosis not present

## 2023-03-22 DIAGNOSIS — H353232 Exudative age-related macular degeneration, bilateral, with inactive choroidal neovascularization: Secondary | ICD-10-CM | POA: Diagnosis not present

## 2023-03-26 NOTE — Progress Notes (Unsigned)
Assessment/Plan:   1.  Essential Tremor.  -This is evidenced by the symmetrical nature and longstanding hx of gradually getting worse.  We discussed nature and pathophysiology.  We discussed that this can continue to gradually get worse with time.  We discussed that some medications can worsen this, as can caffeine use.  We discussed medication therapy as well as surgical therapy.  Ultimately, the patient decided to start primidone and work to 50 mg bid.  This is likely not enough but we will start here.    -we are going do a DaTscan, as she has developed some rest tremor on the right.  This is likely just a consequence of longstanding essential tremor, but I want to make sure she is not developing a neurodegenerative process.   Subjective:   Emily Mitchell was seen in consultation in the movement disorder clinic at the request of Clayborne Dana, NP.  The evaluation is for tremor.  Pt with son who supplements hx.  I have seen the patient in the past for the same.  She was seen back in 2015-2018 for the same and then she came in 2021 for the same.  We previously discussed that she wasn't a candidate for propranolol as she was on coreg and we worried about her lung function anyway.  She didn't want primidone at the time.  She follows up today to discuss.   Patient tremor started approximately 20 years ago and involves the "the whole body."  Son states that she fell in June - she was on a stool to get her stack dryer and fell and hurt her shoulder.  The tremor is making her shoulder pain worse.  Both hands shake equally.    Tremor is most noticeable when grabbing something but they shake when sitting there.   There is a little tremor in her son.  Affected by caffeine:  doesn't drink enough to know Affected by alcohol:  doesn't drink enough to know Affected by stress:  Yes.   Affected by fatigue:  Yes.   Spills soup if on spoon:  Yes.   Spills glass of liquid if full:  Yes.   Affects ADL's (tying  shoes, brushing teeth, etc):  No.  Current/Previously tried tremor medications: On clonazepam 1 mg daily; gabapentin 100 mg twice daily; prior carvedilol (not currently)  Current medications that may exacerbate tremor:  Albuterol (she uses it daily and not sure if worsens); Singulair; breztri  Outside reports reviewed: historical medical records, office notes, and referral letter/letters.  Allergies  Allergen Reactions   Niaspan [Niacin Er] Nausea And Vomiting and Swelling    Swelling in mouth   Pantoprazole     Mouth sores   Rosuvastatin Calcium Anaphylaxis   Sulfonamide Derivatives Hives   Bupropion Other (See Comments)    Uncontrollable shakes   Nitroglycerin     NOT allergic - cardiology has recommended she NOT use this due to h/o severe carotid disease as it may decrease cerebral perfusion   Penicillins Hives    DID THE REACTION INVOLVE: Swelling of the face/tongue/throat, SOB, or low BP? Yes Sudden or severe rash/hives, skin peeling, or the inside of the mouth or nose? Unknown Did it require medical treatment? Unknown When did it last happen?   [age]  If all above answers are "NO", may proceed with cephalosporin use.   Statins    Apple Rash   Apple Juice Rash   Banana Rash    Current Meds  Medication Sig  acetaminophen (TYLENOL) 500 MG tablet Take 500 mg by mouth as needed for headache.   albuterol (VENTOLIN HFA) 108 (90 Base) MCG/ACT inhaler Inhale 2 puffs into the lungs every 6 (six) hours as needed for wheezing or shortness of breath.   aspirin EC 81 MG tablet Take 1 tablet (81 mg total) by mouth daily.   atorvastatin (LIPITOR) 10 MG tablet TAKE 1 TABLET BY MOUTH EVERY DAY   BREZTRI AEROSPHERE 160-9-4.8 MCG/ACT AERO INHALE 2 PUFFS TWICE DAILY IN MORNING AND IN THE EVENING   Calcium Citrate-Vitamin D 200-250 MG-UNIT TABS Take 1 tablet by mouth daily.   Cholecalciferol 25 MCG (1000 UT) tablet Take 1,000 Units by mouth daily.   Choline Fenofibrate (FENOFIBRIC ACID) 135  MG CPDR TAKE 1 CAPSULE BY MOUTH EVERY DAY   clonazePAM (KLONOPIN) 1 MG tablet TAKE 1 TABLET BY MOUTH 3 TIMES DAILY AS NEEDED. FOR ANXIETY   cyanocobalamin 100 MCG tablet Take 500 mcg by mouth daily.   cyclobenzaprine (FLEXERIL) 10 MG tablet Take 1 tablet (10 mg total) by mouth at bedtime as needed.   ezetimibe (ZETIA) 10 MG tablet Take 1 tablet (10 mg total) by mouth daily.   famotidine (PEPCID) 40 MG tablet TAKE 1 TABLET BY MOUTH EVERY DAY   FLUoxetine (PROZAC) 20 MG tablet Take 1 tablet (20 mg total) by mouth daily.   gabapentin (NEURONTIN) 100 MG capsule TAKE 2 CAPSULES BY MOUTH AT BEDTIME   levothyroxine (SYNTHROID) 100 MCG tablet TAKE 1 TABLET BY MOUTH EVERY DAY   montelukast (SINGULAIR) 10 MG tablet Take 1 tablet (10 mg total) by mouth at bedtime as needed.   NON FORMULARY Take 1 tablet by mouth daily. Digestive advantage.   Omega-3 Fatty Acids (FISH OIL PO) Take 1 capsule by mouth daily.   Probiotic Product (VSL#3) CAPS Take 1 capsule by mouth daily.      Objective:   VITALS:   Vitals:   03/28/23 0846  BP: 136/84  Pulse: 86  SpO2: 96%  Weight: 179 lb 12.8 oz (81.6 kg)  Height: 5\' 3"  (1.6 m)   Gen:  Appears stated age and in NAD. HEENT:  Normocephalic, atraumatic. The mucous membranes are moist. The superficial temporal arteries are without ropiness or tenderness. Cardiovascular: Regular rate and rhythm. Lungs: Clear to auscultation bilaterally. Neck: There are no carotid bruits noted bilaterally.  NEUROLOGICAL:  Orientation:  The patient is alert and oriented x 3.   Cranial nerves: There is good facial symmetry. Extraocular muscles are intact and visual fields are full to confrontational testing. Speech is fluent and clear. Soft palate rises symmetrically and there is no tongue deviation. Hearing is intact to conversational tone. Tone: min increased tone in the RUE Sensation: Sensation is intact to light touch touch throughout (facial, trunk, extremities). Vibration is  intact at the bilateral big toe. There is no extinction with double simultaneous stimulation. There is no sensory dermatomal level identified. Coordination:  The patient has no dysdiadichokinesia or dysmetria. Motor: Strength is 5/5 in the bilateral upper and lower extremities.  Shoulder shrug is equal bilaterally.  There is no pronator drift.  There are no fasciculations noted. DTR's: Deep tendon reflexes are 3/4 at the bilateral biceps, triceps, brachioradialis, patella and achilles.  Plantar responses are downgoing bilaterally. Gait and Station: The patient is short stepped but she doesn't shuffle.    MOVEMENT EXAM: Tremor:  there is RUE rest tremor.  There is postural tremor, R>L.  she has mild difficulty with archimedes spirals.  she spills water  when pouring water from one glass to another esp when the full glass is in the R hand.  There is head tremor in the "yes" direction.  There is chin/jaw tremor.  I have reviewed and interpreted the following labs independently   Chemistry      Component Value Date/Time   NA 143 01/03/2023 1407   NA 140 10/31/2021 0000   K 5.0 01/03/2023 1407   CL 106 01/03/2023 1407   CO2 27 01/03/2023 1407   BUN 37 (H) 01/03/2023 1407   BUN 28 (A) 10/31/2021 0000   CREATININE 1.82 (H) 01/03/2023 1407   CREATININE 2.45 (H) 12/12/2013 1521   GLU 87 10/31/2021 0000      Component Value Date/Time   CALCIUM 9.4 01/03/2023 1407   CALCIUM 9.5 11/25/2009 1346   ALKPHOS 49 01/03/2023 1407   AST 21 01/03/2023 1407   ALT 15 01/03/2023 1407   BILITOT 0.6 01/03/2023 1407   BILITOT 0.4 04/12/2020 1137      Lab Results  Component Value Date   WBC 8.8 01/03/2023   HGB 12.4 01/03/2023   HCT 38.4 01/03/2023   MCV 91.7 01/03/2023   PLT 405.0 (H) 01/03/2023   Lab Results  Component Value Date   TSH 0.94 01/03/2023      Total time spent on today's visit was 72 minutes, including both face-to-face time and nonface-to-face time.  Time included that spent on  review of records (prior notes available to me/labs/imaging if pertinent), discussing treatment and goals, answering patient's questions and coordinating care.  CC:  Bradd Canary, MD

## 2023-03-28 ENCOUNTER — Ambulatory Visit: Payer: Medicare Other | Admitting: Neurology

## 2023-03-28 ENCOUNTER — Encounter: Payer: Self-pay | Admitting: Neurology

## 2023-03-28 ENCOUNTER — Other Ambulatory Visit: Payer: Self-pay | Admitting: Family Medicine

## 2023-03-28 VITALS — BP 136/84 | HR 86 | Ht 63.0 in | Wt 179.8 lb

## 2023-03-28 DIAGNOSIS — G250 Essential tremor: Secondary | ICD-10-CM | POA: Diagnosis not present

## 2023-03-28 DIAGNOSIS — R251 Tremor, unspecified: Secondary | ICD-10-CM

## 2023-03-28 MED ORDER — PRIMIDONE 50 MG PO TABS: 50.0000 mg | ORAL_TABLET | Freq: Two times a day (BID) | ORAL | 1 refills | Status: DC

## 2023-03-28 NOTE — Patient Instructions (Addendum)
Start primidone 50 mg - 1/2 tablet at bedtime for 1 week and then increase to 1 tablet at bedtime x 1 week and then 1 tablet twice per day thereafter.  This likely won't be enough but we will start here.   As we discussed, we are going to do a DaT scan.  We discussed that this is not a diagnostic scan, but will just give [place] some information on dopamine levels in the brain.  Here is some information which may be helpful to you.  Before the Exam  Please tell the nurse, nuclear imaging technician or nuclear medicine physician if you are pregnant, nursing or have reduced liver function. Please also inform us if you have an allergy or sensitivity to iodine.  The test may be completed with those who are allergic to iodine, but may require pre-medication with other medications to help avoid reactions. If you need to cancel the examination, please give [place] at least 24 hours notice.  Before your scan, stop taking these medicines for the length of time shown: Name of Drug Stop Taking  Amoxapine 4 days before  Benztropine  Cogentin 3 days before  Bupropion (Aplenzin, Budeprion, Voxra, Wellbutrin, Zyban) 48 hours before  Buspirone 15 hours before  Citalopram 24 hours before  Cocaine 6 hours before  Escitalopram 24 hours before  Methamphetamine 24 hours before  Methylphenidate (Concerta, Metadate, Methylin, Ritalin) 20 hours before  Paroxetine 24 hours before  Selegilene 48 hours before  Sertraline  Prozac (fluoxetine) 3 days before  3 days before     On the Day of the Exam Drink plenty of fluids and go to the bathroom frequently (and for two days after your exam) Wear loose comfortable clothing, since you will need to lie still for a period of time. Please bring a list of all medications that you are taking; name and dosage. We want to make your waiting time as pleasant as possible. Consider bringing your favorite magazine, book or music player to help you pass the time.  You do not need to  stay at the imaging facility the entire time, between the initial injection and the scan itself.   Please leave your jewelry and valuables at home.  During the Exam The DaTscan once started takes approximately 30-45 minutes. However, following injection of the DaT agent approximately 3-6 hours are required before the agent has achieved appropriate concentration in the brain.  We will inject the DaTscan through an intravenous (IV) line into your arm in the AM, usually around 8-9am, and then you will come back usually in the mid afternoon for the scan. Before the exam, you will receive a drug to allow you to protect the thyroid. For the imaging test, you will be asked to lie on a table and an imaging technologist will position your head in a headrest. A strip of tape or a flexible restraint may be placed around your head to help you to not move your head during the scan. A camera will be positioned above you and you must remain very still for about 30 minute while images are taken. The scanner will be very close to your head, but will not touch your head.

## 2023-04-04 NOTE — Assessment & Plan Note (Deleted)
hgba1c acceptable, minimize simple carbs. Increase exercise as tolerated.  

## 2023-04-04 NOTE — Assessment & Plan Note (Deleted)
Follows with [facility]

## 2023-04-04 NOTE — Assessment & Plan Note (Deleted)
Continues to be monitored by ophthalmology and is doing well

## 2023-04-04 NOTE — Assessment & Plan Note (Deleted)
Supplement and monitor 

## 2023-04-04 NOTE — Assessment & Plan Note (Deleted)
Stable- follows with cardiology.

## 2023-04-04 NOTE — Assessment & Plan Note (Deleted)
Unable to take statins 

## 2023-04-04 NOTE — Assessment & Plan Note (Deleted)
Follows with pulmonology, no change in meds

## 2023-04-05 ENCOUNTER — Ambulatory Visit: Payer: Medicare Other | Admitting: Family Medicine

## 2023-04-05 DIAGNOSIS — N184 Chronic kidney disease, stage 4 (severe): Secondary | ICD-10-CM

## 2023-04-05 DIAGNOSIS — J441 Chronic obstructive pulmonary disease with (acute) exacerbation: Secondary | ICD-10-CM

## 2023-04-05 DIAGNOSIS — E559 Vitamin D deficiency, unspecified: Secondary | ICD-10-CM

## 2023-04-05 DIAGNOSIS — I509 Heart failure, unspecified: Secondary | ICD-10-CM

## 2023-04-05 DIAGNOSIS — H353211 Exudative age-related macular degeneration, right eye, with active choroidal neovascularization: Secondary | ICD-10-CM

## 2023-04-05 DIAGNOSIS — R739 Hyperglycemia, unspecified: Secondary | ICD-10-CM

## 2023-04-05 DIAGNOSIS — Z789 Other specified health status: Secondary | ICD-10-CM

## 2023-04-05 DIAGNOSIS — E079 Disorder of thyroid, unspecified: Secondary | ICD-10-CM

## 2023-04-06 ENCOUNTER — Telehealth: Payer: Self-pay | Admitting: Family Medicine

## 2023-04-06 NOTE — Telephone Encounter (Signed)
Caller Name Laquanta Dimanche Caller Phone Number [phone] Patient Name Emily Mitchell Patient DOB 1946/05/20 Call Type Message Only Information Provided Reason for Call Medication Question / Request Initial Comment Caller had called this morning, cause the fell a couple times, to see if they could get some flexroll and tramadol. They were standing in the yard with their granddaughter and fell backwards with her, this is 4 falls this year. They see Dr.Blyth. Additional Comment Caller hit her knees, elbows, and head, and was so upset her granddaughter needed to take over the call, she wanted her doc to know what happened. Disp. Time Disposition Final User 04/06/2023 2:09:25 PM General Information Provided Yes Wandra Mannan Call Closed By: Wandra Mannan Transaction Date/Time: 04/06/2023 2:04:48 PM (ET)

## 2023-04-06 NOTE — Telephone Encounter (Signed)
Pt called back and stated she fell again and hit her head this time. Pt seemed in great pain as she was crying over the phone. I transferred her to Triage and informed her that I will notify pcp.

## 2023-04-06 NOTE — Telephone Encounter (Signed)
Pt called stating her shoulder is still bothering her since her fall and she has fallen again since then. Pt would like to see if some flexeril could be prescribed to help with this.

## 2023-04-09 ENCOUNTER — Telehealth: Payer: Self-pay

## 2023-04-09 ENCOUNTER — Ambulatory Visit (HOSPITAL_BASED_OUTPATIENT_CLINIC_OR_DEPARTMENT_OTHER)
Admission: RE | Admit: 2023-04-09 | Discharge: 2023-04-09 | Disposition: A | Discharge status: Home / Self Care | Payer: Medicare Other | Source: Ambulatory Visit | Attending: Physician Assistant | Admitting: Physician Assistant

## 2023-04-09 ENCOUNTER — Ambulatory Visit (INDEPENDENT_AMBULATORY_CARE_PROVIDER_SITE_OTHER): Payer: Medicare Other | Admitting: Physician Assistant

## 2023-04-09 ENCOUNTER — Encounter: Payer: Self-pay | Admitting: Physician Assistant

## 2023-04-09 VITALS — BP 118/62 | HR 85 | Temp 98.2°F | Resp 20 | Wt 181.4 lb

## 2023-04-09 DIAGNOSIS — I6782 Cerebral ischemia: Secondary | ICD-10-CM | POA: Insufficient documentation

## 2023-04-09 DIAGNOSIS — M542 Cervicalgia: Secondary | ICD-10-CM | POA: Insufficient documentation

## 2023-04-09 DIAGNOSIS — M25512 Pain in left shoulder: Secondary | ICD-10-CM | POA: Diagnosis not present

## 2023-04-09 DIAGNOSIS — M47812 Spondylosis without myelopathy or radiculopathy, cervical region: Secondary | ICD-10-CM | POA: Diagnosis not present

## 2023-04-09 DIAGNOSIS — W19XXXA Unspecified fall, initial encounter: Secondary | ICD-10-CM | POA: Insufficient documentation

## 2023-04-09 DIAGNOSIS — S0990XA Unspecified injury of head, initial encounter: Secondary | ICD-10-CM | POA: Insufficient documentation

## 2023-04-09 DIAGNOSIS — M4802 Spinal stenosis, cervical region: Secondary | ICD-10-CM | POA: Diagnosis not present

## 2023-04-09 MED ORDER — TRAMADOL HCL 50 MG PO TABS
50.0000 mg | ORAL_TABLET | Freq: Three times a day (TID) | ORAL | 0 refills | Status: AC | PRN
Start: 2023-04-09 — End: 2023-04-14

## 2023-04-09 NOTE — Telephone Encounter (Signed)
Pt called back was advised she will need appt to  for evaluation for falls and medication wouldn't be opinion if already falling per [place], [place].  Pt stated she understand, ok with the appt. I ask pt if she ok to drive her self and pt stated she would have someone to ride with her.

## 2023-04-09 NOTE — Progress Notes (Signed)
Established patient visit   Patient: Emily Mitchell   DOB: [birth year]   77 y.o. Female  MRN: [id number] Visit Date: 04/09/2023  Today's healthcare provider: Alfredia Ferguson, PA-C   Chief Complaint  Patient presents with   Fall    Left shoulder, knees, and head pain due to new fall on 04/06/2023   Subjective    HPI  Pt reports a fall Friday, sudden LOC woke up on the ground. Denies trip and fall. Reports hitting her head, having some left sided head pain, that her dentures dont fit correctly. Thinks she reinjured her left shoulder as it hurts more, along with the left side of her neck. Denies weakness, numbness, changes in vision.   Medications: Outpatient Medications Prior to Visit  Medication Sig   acetaminophen (TYLENOL) 500 MG tablet Take 500 mg by mouth as needed for headache.   albuterol (VENTOLIN HFA) 108 (90 Base) MCG/ACT inhaler Inhale 2 puffs into the lungs every 6 (six) hours as needed for wheezing or shortness of breath.   aspirin EC 81 MG tablet Take 1 tablet (81 mg total) by mouth daily.   atorvastatin (LIPITOR) 10 MG tablet Take 1 tablet (10 mg total) by mouth daily.   BREZTRI AEROSPHERE 160-9-4.8 MCG/ACT AERO INHALE 2 PUFFS TWICE DAILY IN MORNING AND IN THE EVENING   Calcium Citrate-Vitamin D 200-250 MG-UNIT TABS Take 1 tablet by mouth daily.   Cholecalciferol 25 MCG (1000 UT) tablet Take 1,000 Units by mouth daily.   Choline Fenofibrate (FENOFIBRIC ACID) 135 MG CPDR TAKE 1 CAPSULE BY MOUTH EVERY DAY   clonazePAM (KLONOPIN) 1 MG tablet TAKE 1 TABLET BY MOUTH 3 TIMES DAILY AS NEEDED. FOR ANXIETY   cyanocobalamin 100 MCG tablet Take 500 mcg by mouth daily.   cyclobenzaprine (FLEXERIL) 10 MG tablet Take 1 tablet (10 mg total) by mouth at bedtime as needed.   ezetimibe (ZETIA) 10 MG tablet Take 1 tablet (10 mg total) by mouth daily.   famotidine (PEPCID) 40 MG tablet TAKE 1 TABLET BY MOUTH EVERY DAY   FLUoxetine (PROZAC) 20 MG tablet Take 1 tablet (20 mg total) by  mouth daily.   gabapentin (NEURONTIN) 100 MG capsule TAKE 2 CAPSULES BY MOUTH AT BEDTIME   levothyroxine (SYNTHROID) 100 MCG tablet TAKE 1 TABLET BY MOUTH EVERY DAY   montelukast (SINGULAIR) 10 MG tablet Take 1 tablet (10 mg total) by mouth at bedtime as needed.   NON FORMULARY Take 1 tablet by mouth daily. Digestive advantage.   Omega-3 Fatty Acids (FISH OIL PO) Take 1 capsule by mouth daily.   primidone (MYSOLINE) 50 MG tablet Take 1 tablet (50 mg total) by mouth 2 (two) times daily.   Probiotic Product (VSL#3) CAPS Take 1 capsule by mouth daily.   No facility-administered medications prior to visit.    Review of Systems  Constitutional:  Negative for fatigue and fever.  Respiratory:  Negative for cough and shortness of breath.   Cardiovascular:  Negative for chest pain and leg swelling.  Gastrointestinal:  Negative for abdominal pain.  Musculoskeletal:  Positive for arthralgias and neck pain.  Neurological:  Positive for headaches. Negative for dizziness.      Objective    BP 118/62 (BP Location: Left Arm, Patient Position: Sitting, Cuff Size: Normal)   Pulse 85   Temp 98.2 F (36.8 C) (Oral)   Resp 20   Wt 181 lb 6.4 oz (82.3 kg)   SpO2 98%   BMI 32.13 kg/m  Physical Exam Vitals reviewed.  Constitutional:      Appearance: She is not ill-appearing.  HENT:     Head: Normocephalic.  Eyes:     Conjunctiva/sclera: Conjunctivae normal.  Cardiovascular:     Rate and Rhythm: Normal rate.  Pulmonary:     Effort: Pulmonary effort is normal. No respiratory distress.  Musculoskeletal:     Comments: Tenderness left side of neck, top of head -- no visible hematoma, discoloration, laceration.  Continued tenderness and lack of ROM 2/2 pain to L shoulder  Neurological:     General: No focal deficit present.     Mental Status: She is alert and oriented to person, place, and time.     Comments: Symmetry facial movements  Psychiatric:        Mood and Affect: Mood normal.         Behavior: Behavior normal.      No results found for any visits on 04/09/23.  Assessment & Plan     1. Fall, initial encounter 2. Injury of head, initial encounter Repeated nonmechanical falls. Pt's neuro is w/u for tumor or other acute neurologic condition Given loc, head injury -- ct head ordered today r/o bleed  Given repeated falls-- cautioned against driving, walking w/o walker, to not hold and carry grandchildren - CT HEAD WO CONTRAST ( )  3. Neck pain Likely strain. Ordered cervical xray to confirm - DG Cervical Spine Complete  4. Acute pain of left shoulder Xrays from last fall w/ no abnormalities. Ref to ortho Discussed pain medication -- tylenol daily, and tramadol only for severe pain. Cautioned as can contribute to drowsiness. Pt expressed understanding  - traMADol (ULTRAM) 50 MG tablet; Take 1 tablet (50 mg total) by mouth every 8 (eight) hours as needed for up to 5 days.  Dispense: 15 tablet; Refill: 0  -amb ref ortho  Return if symptoms worsen or fail to improve.      I, Alfredia Ferguson, PA-C have reviewed all documentation for this visit. The documentation on  04/09/23   for the exam, diagnosis, procedures, and orders are all accurate and complete.   Alfredia Ferguson, PA-C  [facility] [phone] (phone) [phone] (fax)  [facility]

## 2023-04-09 NOTE — Telephone Encounter (Signed)
Called pt lvm needing to see if we can get her scheduled for follow up for falls / shoulder pain. Called spoke with pt son about pt having falls and he stated that her Neurology Dr give her meds for tremors the medication is making her BP go up. Pt son said he  would have her call our office to make appointment.

## 2023-04-11 ENCOUNTER — Other Ambulatory Visit: Payer: Self-pay | Admitting: Family Medicine

## 2023-04-13 ENCOUNTER — Encounter (HOSPITAL_COMMUNITY)
Admission: RE | Admit: 2023-04-13 | Discharge: 2023-04-13 | Disposition: A | Discharge status: Home / Self Care | Payer: Medicare Other | Source: Ambulatory Visit | Attending: Neurology | Admitting: Neurology

## 2023-04-13 DIAGNOSIS — R251 Tremor, unspecified: Secondary | ICD-10-CM | POA: Diagnosis not present

## 2023-04-13 MED ORDER — POTASSIUM IODIDE (ANTIDOTE) 130 MG PO TABS
ORAL_TABLET | ORAL | Status: AC
  Filled 2023-04-13: qty 1

## 2023-04-13 MED ORDER — POTASSIUM IODIDE (ANTIDOTE) 130 MG PO TABS: 130.0000 mg | ORAL_TABLET | Freq: Once | ORAL | Status: DC

## 2023-04-13 MED ORDER — IOFLUPANE I 123 185 MBQ/2.5ML IV SOLN
4.9000 | Freq: Once | INTRAVENOUS | Status: AC | PRN
  Administered 2023-04-13: 4.9 via INTRAVENOUS
  Filled 2023-04-13: qty 5

## 2023-04-26 ENCOUNTER — Other Ambulatory Visit: Payer: Self-pay | Admitting: Family Medicine

## 2023-04-26 DIAGNOSIS — H43822 Vitreomacular adhesion, left eye: Secondary | ICD-10-CM | POA: Diagnosis not present

## 2023-04-26 DIAGNOSIS — H35363 Drusen (degenerative) of macula, bilateral: Secondary | ICD-10-CM | POA: Diagnosis not present

## 2023-04-26 DIAGNOSIS — E785 Hyperlipidemia, unspecified: Secondary | ICD-10-CM

## 2023-04-26 DIAGNOSIS — H353211 Exudative age-related macular degeneration, right eye, with active choroidal neovascularization: Secondary | ICD-10-CM | POA: Diagnosis not present

## 2023-04-26 DIAGNOSIS — H353222 Exudative age-related macular degeneration, left eye, with inactive choroidal neovascularization: Secondary | ICD-10-CM | POA: Diagnosis not present

## 2023-04-29 ENCOUNTER — Other Ambulatory Visit: Payer: Self-pay | Admitting: Family Medicine

## 2023-04-30 NOTE — Telephone Encounter (Signed)
Requesting: clonazepam 1mg   Contract: 01/03/23 UDS: 01/03/23 Last Visit: 01/03/23 w/ Ladona Ridgel (other visits acute in August and Sept) Next Visit: 07/23/2023 Last Refill: 02/26/2023 #90 and 1OX   Please Advise

## 2023-05-02 ENCOUNTER — Ambulatory Visit: Payer: Medicare Other | Admitting: *Deleted

## 2023-05-02 VITALS — BP 128/64 | HR 94 | Ht 63.0 in | Wt 181.0 lb

## 2023-05-02 DIAGNOSIS — Z0000 Encounter for general adult medical examination without abnormal findings: Secondary | ICD-10-CM | POA: Diagnosis not present

## 2023-05-02 NOTE — Progress Notes (Signed)
Subjective:   Emily Mitchell is a 77 y.o. female who presents for Medicare Annual (Subsequent) preventive examination.  Visit Complete: In person   Cardiac Risk Factors include: advanced age (>47men, >34 women);dyslipidemia;hypertension;smoking/ tobacco exposure;obesity (BMI >30kg/m2)     Objective:    Today's Vitals   05/02/23 1426  BP: 128/64  Pulse: 94  Weight: 181 lb (82.1 kg)  Height: 5\' 3"  (1.6 m)   Body mass index is 32.06 kg/m.     05/02/2023    2:29 PM 03/28/2023    8:48 AM 04/19/2022    3:46 PM 04/20/2021   12:46 PM 03/24/2021    2:40 PM 02/15/2021    9:49 AM 06/03/2020    8:26 PM  Advanced Directives  Does Patient Have a Medical Advance Directive? Yes No Yes Yes Yes Yes No;Yes  Type of Estate agent of [place];Living will  Healthcare Power of [place];Living will  Healthcare Power of [place];Living will    Does patient want to make changes to medical advance directive? No - Patient declined  No - Patient declined      Copy of Healthcare Power of Attorney in Chart? Yes - validated most recent copy scanned in chart (See row information)  Yes - validated most recent copy scanned in chart (See row information)  Yes - validated most recent copy scanned in chart (See row information)      Current Medications (verified) Outpatient Encounter Medications as of 05/02/2023  Medication Sig   acetaminophen (TYLENOL) 500 MG tablet Take 500 mg by mouth as needed for headache.   albuterol (VENTOLIN HFA) 108 (90 Base) MCG/ACT inhaler Inhale 2 puffs into the lungs every 6 (six) hours as needed for wheezing or shortness of breath.   aspirin EC 81 MG tablet Take 1 tablet (81 mg total) by mouth daily.   atorvastatin (LIPITOR) 10 MG tablet Take 1 tablet (10 mg total) by mouth daily.   BREZTRI AEROSPHERE 160-9-4.8 MCG/ACT AERO INHALE 2 PUFFS TWICE DAILY IN MORNING AND IN THE EVENING   Calcium Citrate-Vitamin D 200-250 MG-UNIT TABS Take 1 tablet by mouth daily.    Cholecalciferol 25 MCG (1000 UT) tablet Take 1,000 Units by mouth daily.   Choline Fenofibrate (FENOFIBRIC ACID) 135 MG CPDR TAKE 1 CAPSULE BY MOUTH EVERY DAY   clonazePAM (KLONOPIN) 1 MG tablet TAKE 1 TABLET BY MOUTH 3 TIMES DAILY AS NEEDED. FOR ANXIETY   cyanocobalamin 100 MCG tablet Take 500 mcg by mouth daily.   cyclobenzaprine (FLEXERIL) 10 MG tablet Take 1 tablet (10 mg total) by mouth at bedtime as needed.   ezetimibe (ZETIA) 10 MG tablet TAKE 1 TABLET BY MOUTH EVERY DAY   famotidine (PEPCID) 40 MG tablet TAKE 1 TABLET BY MOUTH EVERY DAY   FLUoxetine (PROZAC) 20 MG tablet TAKE 1 TABLET BY MOUTH EVERY DAY   gabapentin (NEURONTIN) 100 MG capsule TAKE 2 CAPSULES BY MOUTH AT BEDTIME   levothyroxine (SYNTHROID) 100 MCG tablet TAKE 1 TABLET BY MOUTH EVERY DAY   montelukast (SINGULAIR) 10 MG tablet TAKE 1 TABLET BY MOUTH AT BEDTIME AS NEEDED.   NON FORMULARY Take 1 tablet by mouth daily. Digestive advantage.   Omega-3 Fatty Acids (FISH OIL PO) Take 1 capsule by mouth daily.   primidone (MYSOLINE) 50 MG tablet Take 1 tablet (50 mg total) by mouth 2 (two) times daily.   Probiotic Product (VSL#3) CAPS Take 1 capsule by mouth daily.   No facility-administered encounter medications on file as of 05/02/2023.  Allergies (verified) Niaspan [niacin er], Pantoprazole, Rosuvastatin calcium, Sulfonamide derivatives, Bupropion, Nitroglycerin, Penicillins, Statins, Apple, Apple juice, and Banana   History: Past Medical History:  Diagnosis Date   Acid reflux    Allergic rhinitis 05/19/2022   Anemia 07/05/2014   Anxiety and depression 12/14/2008   Qualifier: Diagnosis of  By: Hart Rochester LPN, Raynelle Fanning     Arthritis    "fingers, toes, knees, joints" (07/18/2018)   Asthma    Atherosclerotic heart disease of native coronary artery without angina pectoris 07/19/2018   Atypical chest pain 05/23/2015   Back pain 02/02/2019   Breast mass, left 04/28/2019   CAD in native artery 07/19/2018   Candidal skin  infection 02/28/2018   Carotid artery disease (HCC) 06/16/2015   S/p CEA    Carotid artery occlusion    a. R carotid occluded, 70-99% LICA.   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 Overview:  Spirometry 05/12/2013: FEV1 72% predicted FEV1 FVC ratio 73 predicted FEF 25 75  43% predicted  Last Assessment & Plan:  OK to stop taking advair Use albuterol as needed only Check oxygen levels during sleep - we will call you with results Call if symptoms worse   Chronic rhinitis 06/14/2018   CKD (chronic kidney disease), stage IV (HCC)    Follows with [facility], Dr Oley Balm    Congestive heart failure (HCC) 11/30/2015   Formatting of this note might be different from the original. Last Assessment & Plan:  No recent exacerbation   Constipation 12/17/2008   Qualifier: Diagnosis of  By: De Blanch    COPD with asthma (HCC) 11/06/2017   Dry eyes 11/30/2015   Essential hypertension 10/18/2015   Formatting of this note might be different from the original. Last Assessment & Plan:  poorlycontrolled and under a great deal of stress, add Metoprolol XL 25 mg daily. Encouraged heart healthy diet such as the DASH diet and exercise as tolerated.   Essential tremor 11/05/2013   Exudative age-related macular degeneration of right eye with active choroidal neovascularization (HCC) 08/26/2015   Gastro-esophageal reflux disease without esophagitis 12/14/2008   Formatting of this note might be different from the original. Formatting of this note might be different from the original. Formatting of this note might be different from the original. Overview:  Qualifier: Diagnosis of  By: Hart Rochester LPN, Raynelle Fanning   Last Assessment & Plan:  Avoid offending foods, start  probiotics. Do not eat large meals in late evening and consider raising head of bed. Last Assessment   GERD (gastroesophageal reflux disease)    H. pylori infection 10/19/2013   H/O multiple allergies 07/14/2018   Helicobacter pylori (H. pylori) as the cause of diseases classified elsewhere 10/19/2013   Formatting of this note might be different from the original. Last Assessment & Plan:  Tetracycline, Pepto Bismol, Flagyl and Omeprazole.   Hepatitis 1970s   "don't know for sure which one it was; think it was A" (07/18/2018)   History of food allergy 08/14/2018   History of pulmonary embolism 07/17/2018   Hoarseness 03/09/2019   Hypercalcemia 03/09/2019   Hyperglycemia 03/14/2016   Hyperlipidemia, mixed 05/26/2013   Crestor caused N/V Did not tolerate Lipitor    Knee pain, bilateral  12/17/2012   Left hip pain 05/23/2015   Low back pain 09/19/2015   Lower urinary tract infectious disease 10/19/2013   Lymphadenopathy 10/18/2017   Macular degeneration of both eyes 12/16/2015   Right eye is wet Left Eye is dry Shots every 11 to 12 weeks in right eye at opthamologist office   Medicare annual wellness visit, subsequent 02/21/2015   Mixed anxiety depressive disorder 12/14/2008   Formatting of this note might be different from the original. Overview:  Qualifier: Diagnosis of  By: Hart Rochester LPN, Raynelle Fanning  Patient had increased  Tremors on Bupropion  Last Assessment & Plan:  Is struggling with the stress of her 76 year old granddaughter whom she raised moving back with her own mother, the patient is estranged from her daughter so she is very upset. Increase Citalopram 40 mg daily    Mumps 77 yrs old   Neck pain 04/22/2017   Numbness of finger 12/17/2012   Occlusion and stenosis of carotid artery without mention of cerebral infarction 12/17/2012   OSA (obstructive sleep apnea) 12/14/2008   PSG  06/2013-AHI 6/h 08/2011 (bethany) AHI 4/h No longer needs CPAP    Preventative health care 03/14/2016    Pulmonary emboli (HCC) 05/26/2013   Recurrent infection of skin 07/19/2021   Rheumatoid factor positive 09/27/2021   Right shoulder pain 10/18/2017   Sensation of foreign body in larynx 04/08/2019   Skin lesion of back 03/27/2021   Sleep apnea    "don't use CPAP anymore; dr said I didn't have to" (07/18/2018)   Static tremor 11/05/2013   Formatting of this note might be different from the original. Last Assessment & Plan:  Follows with [facility]   Statin intolerance 09/29/2019   Stroke [facility])    "been told I've had some strokes; didn't know I'd had them" (07/18/2018)   Thyroid disease    Vitamin D deficiency 03/03/2018   Vitreomacular adhesion of both eyes 01/07/2019   Formatting of this note might be different from the original. no traction   Past Surgical History:  Procedure Laterality Date   APPENDECTOMY  1984   CARDIAC CATHETERIZATION  ~ 2006   CAROTID ANGIOGRAPHY Left 08/20/2018   Procedure: CAROTID ANGIOGRAPHY;  Surgeon: Nada Libman, MD;  Location: [facility];  Service: Cardiovascular;  Laterality: Left;   CAROTID ENDARTERECTOMY Right 05/10/2007   cea   CATARACT EXTRACTION Left    CATARACT EXTRACTION W/ INTRAOCULAR LENS  IMPLANT, BILATERAL Bilateral    COLONOSCOPY  09/25/2008   normal   COLONOSCOPY WITH PROPOFOL  02/13/2022   [facility] Bologna at [facility]   CORONARY ANGIOPLASTY WITH STENT PLACEMENT  07/18/2018   CORONARY PRESSURE/FFR STUDY N/A 07/18/2018   Procedure: INTRAVASCULAR PRESSURE WIRE/FFR STUDY;  Surgeon: Runell Gess, MD;  Location: [facility];  Service: Cardiovascular;  Laterality: N/A;   CORONARY STENT INTERVENTION N/A 07/18/2018   Procedure: CORONARY STENT INTERVENTION;  Surgeon: Runell Gess, MD;  Location: [facility];  Service: Cardiovascular;  Laterality: N/A;   JOINT REPLACEMENT     LEFT HEART CATH AND CORONARY ANGIOGRAPHY N/A 07/18/2018   Procedure: LEFT HEART CATH AND CORONARY ANGIOGRAPHY;  Surgeon: Runell Gess, MD;   Location: [facility];  Service: Cardiovascular;  Laterality: N/A;   MULTIPLE TOOTH EXTRACTIONS     TOTAL KNEE ARTHROPLASTY Bilateral 2005 - 2011   right - left   VAGINAL HYSTERECTOMY  1984   WISDOM TOOTH EXTRACTION     Family History  Problem  Relation Age of Onset   Stroke Mother        mini stroke   Kidney disease Mother    Heart failure Mother    Hypertension Mother    Diabetes Sister        type 2   Kidney disease Sister    Allergic rhinitis Sister    Osteoarthritis Sister    Hyperlipidemia Sister    Heart attack Maternal Grandfather    Colon cancer Neg Hx    Stomach cancer Neg Hx    Rectal cancer Neg Hx    Social History   Socioeconomic History   Marital status: Divorced    Spouse name: Not on file   Number of children: 2   Years of education: Not on file   Highest education level: Not on file  Occupational History   Not on file  Tobacco Use   Smoking status: Former    Current packs/day: 0.00    Average packs/day: 2.0 packs/day for 30.0 years (60.0 ttl pk-yrs)    Types: Cigarettes    Start date: 12/02/1964    Quit date: 12/03/1994    Years since quitting: 28.4   Smokeless tobacco: Never   Tobacco comments:    Quit smoking 26 years ago  Vaping Use   Vaping status: Never Used  Substance and Sexual Activity   Alcohol use: Yes    Comment: special occasions   Drug use: Never   Sexual activity: Not Currently    Birth control/protection: Post-menopausal    Comment: lives with son and friend, no dietary restrictions  Other Topics Concern   Not on file  Social History Narrative   Right handed   No caffeine   One story home   Social Determinants of Health   Financial Resource Strain: Low Risk  (05/02/2023)   Overall Financial Resource Strain (CARDIA)    Difficulty of Paying Living Expenses: Not hard at all  Food Insecurity: No Food Insecurity (05/02/2023)   Hunger Vital Sign    Worried About Running Out of Food in the Last Year: Never true    Ran Out  of Food in the Last Year: Never true  Transportation Needs: No Transportation Needs (05/02/2023)   PRAPARE - Administrator, Civil Service (Medical): No    Lack of Transportation (Non-Medical): No  Physical Activity: Inactive (05/02/2023)   Exercise Vital Sign    Days of Exercise per Week: 0 days    Minutes of Exercise per Session: 0 min  Stress: No Stress Concern Present (05/02/2023)   [employer] of Occupational Health - Occupational Stress Questionnaire    Feeling of Stress : Only a little  Social Connections: Socially Isolated (05/02/2023)   Social Connection and Isolation Panel [NHANES]    Frequency of Communication with Friends and Family: Once a week    Frequency of Social Gatherings with Friends and Family: More than three times a week    Attends Religious Services: Never    Database administrator or Organizations: No    Attends Engineer, structural: Never    Marital Status: Divorced    Tobacco Counseling Counseling given: Not Answered Tobacco comments: Quit smoking 26 years ago   Clinical Intake:  Pre-visit preparation completed: Yes  Pain : No/denies pain  BMI - recorded: 32.06 Nutritional Status: BMI > 30  Obese Nutritional Risks: None Diabetes: No  How often do you need to have someone help you when you read instructions, pamphlets, or other written materials  from your doctor or pharmacy?: 1 - Never  Interpreter Needed?: No  Information entered by :: Donne Anon, CMA   Activities of Daily Living    05/02/2023    2:30 PM  In your present state of health, do you have any difficulty performing the following activities:  Hearing? 0  Vision? 0  Difficulty concentrating or making decisions? 1  Walking or climbing stairs? 0  Dressing or bathing? 0  Doing errands, shopping? 0  Preparing Food and eating ? N  Using the Toilet? N  In the past six months, have you accidently leaked urine? Y  Do you have problems with loss of bowel  control? N  Managing your Medications? N  Managing your Finances? N  Housekeeping or managing your Housekeeping? N    Patient Care Team: Bradd Canary, MD as PCP - General (Family Medicine) Revankar, Aundra Dubin, MD as PCP - Cardiology (Cardiology) Oretha Milch, MD as Consulting Physician (Pulmonary Disease) Terrial Rhodes, MD as Consulting Physician (Nephrology) Tat, Octaviano Batty, DO as Consulting Physician (Neurology) Arnetha Gula Lucious Groves, MD as Consulting Physician (Ophthalmology)  Indicate any recent Medical Services you may have received from other than [facility] providers in the past year (date may be approximate).     Assessment:   This is a routine wellness examination for [facility].  Hearing/Vision screen No results found.   Goals Addressed   None    Depression Screen    05/02/2023    2:43 PM 08/14/2022    1:18 PM 04/19/2022    3:55 PM 04/11/2022   11:27 AM 10/06/2021    1:48 PM 03/24/2021    2:42 PM 03/24/2021    2:08 PM  PHQ 2/9 Scores  PHQ - 2 Score 0 0 0 0 0 0 0    Fall Risk    05/02/2023    2:27 PM 04/09/2023    2:35 PM 03/28/2023    8:48 AM 08/14/2022    1:17 PM 04/19/2022    3:45 PM  Fall Risk   Falls in the past year? 1 1 1 1 1   Number falls in past yr: 1 1 0 1 1  Injury with Fall? 1 1 1 1  0  Risk for fall due to : History of fall(s) History of fall(s)   History of fall(s)  Follow up Falls evaluation completed Falls evaluation completed  Falls evaluation completed Falls evaluation completed    MEDICARE RISK AT HOME: Medicare Risk at Home Any stairs in or around the home?: Yes If so, are there any without handrails?: Yes (steps going up to front porch) Home free of loose throw rugs in walkways, pet beds, electrical cords, etc?: Yes Adequate lighting in your home to reduce risk of falls?: Yes Life alert?: No Use of a cane, walker or w/c?: No Grab bars in the bathroom?: No Shower chair or bench in shower?: Yes Elevated toilet seat or a handicapped toilet?:  No  TIMED UP AND GO:  Was the test performed?  Yes  Length of time to ambulate 10 feet: 7 sec Gait slow and steady without use of assistive device    Cognitive Function:    09/18/2017    2:49 PM 09/14/2016    3:10 PM  MMSE - Mini Mental State Exam  Orientation to time 5 5  Orientation to Place 5 5  Registration 3 3  Attention/ Calculation 5 5  Recall 2 3  Language- name 2 objects 2 2  Language- repeat 1 1  Language- follow 3 step command 3 3  Language- read & follow direction 1 1  Write a sentence 1 1  Copy design 1 1  Total score 29 30        05/02/2023    2:44 PM 04/19/2022    3:57 PM  6CIT Screen  What Year? 0 points 0 points  What month? 0 points 0 points  What time? 3 points 0 points  Count back from 20 0 points 0 points  Months in reverse 0 points 0 points  Repeat phrase 6 points 0 points  Total Score 9 points 0 points    Immunizations Immunization History  Administered Date(s) Administered   Fluad Quad(high Dose 65+) 04/17/2019, 04/14/2020, 04/11/2022   Influenza Split 04/29/2013   Influenza, High Dose Seasonal PF 03/30/2016, 04/19/2017, 05/02/2018, 04/30/2023   Influenza,inj,Quad PF,6+ Mos 03/26/2014, 05/17/2015   Influenza-Unspecified 06/14/2021   PFIZER(Purple Top)SARS-COV-2 Vaccination 10/19/2019, 11/10/2019, 05/08/2020, 10/30/2020   Pfizer Covid-19 Vaccine Bivalent Booster 1yrs & up 05/18/2021   Pneumococcal Conjugate-13 04/29/2013   Pneumococcal Polysaccharide-23 04/30/2014, 04/17/2019   Respiratory Syncytial Virus Vaccine,Recomb Aduvanted(Arexvy) 06/14/2022   Tdap 05/26/2013   Zoster Recombinant(Shingrix) 04/27/2017, 06/13/2017   Zoster, Live 07/31/2012    TDAP status: Up to date  Flu Vaccine status: Up to date  Pneumococcal vaccine status: Up to date  Covid-19 vaccine status: Information provided on how to obtain vaccines.   Qualifies for Shingles Vaccine? Yes   Zostavax completed Yes   Shingrix Completed?: Yes  Screening  Tests Health Maintenance  Topic Date Due   COVID-19 Vaccine (6 - 2023-24 season) 04/01/2023   Medicare Annual Wellness (AWV)  04/20/2023   DTaP/Tdap/Td (2 - Td or Tdap) 05/27/2023   Colonoscopy  02/13/2025   Pneumonia Vaccine 28+ Years old  Completed   INFLUENZA VACCINE  Completed   DEXA SCAN  Completed   Hepatitis C Screening  Completed   Zoster Vaccines- Shingrix  Completed   HPV VACCINES  Aged Out    Health Maintenance  Health Maintenance Due  Topic Date Due   COVID-19 Vaccine (6 - 2023-24 season) 04/01/2023   Medicare Annual Wellness (AWV)  04/20/2023    Colorectal cancer screening: Type of screening: Colonoscopy. Completed 02/13/22. Repeat every 3 years  Mammogram status: Completed 05/29/22. Repeat every year  Bone Density status: Completed 05/12/21. Results reflect: Bone density results: OSTEOPENIA. Repeat every 2 years.  Lung Cancer Screening: (Low Dose CT Chest recommended if Age 40-80 years, 20 pack-year currently smoking OR have quit w/in 15years.) does not qualify.   Additional Screening:  Hepatitis C Screening: does qualify; Completed 09/13/15  Vision Screening: Recommended annual ophthalmology exams for early detection of glaucoma and other disorders of the eye. Is the patient up to date with their annual eye exam?  Yes  Who is the provider or what is the name of the office in which the patient attends annual eye exams? Dr. Loma Sousa If pt is not established with a provider, would they like to be referred to a provider to establish care? No .   Dental Screening: Recommended annual dental exams for proper oral hygiene  Diabetic Foot Exam: N/a  Community Resource Referral / Chronic Care Management: CRR required this visit?  No   CCM required this visit?  No     Plan:     I have personally reviewed and noted the following in the patient's chart:   Medical and social history Use of alcohol, tobacco or illicit drugs  Current medications and supplements  including opioid prescriptions. Patient is not currently taking opioid prescriptions. Functional ability and status Nutritional status Physical activity Advanced directives List of other physicians Hospitalizations, surgeries, and ER visits in previous 12 months Vitals Screenings to include cognitive, depression, and falls Referrals and appointments  In addition, I have reviewed and discussed with patient certain preventive protocols, quality metrics, and best practice recommendations. A written personalized care plan for preventive services as well as general preventive health recommendations were provided to patient.     Donne Anon, CMA   05/02/2023   After Visit Summary: sent to mychart.  Nurse Notes: None

## 2023-05-02 NOTE — Patient Instructions (Signed)
Emily Mitchell , Thank you for taking time to come for your Medicare Wellness Visit. I appreciate your ongoing commitment to your health goals. Please review the following plan we discussed and let me know if I can assist you in the future.     This is a list of the screening recommended for you and due dates:  Health Maintenance  Topic Date Due   COVID-19 Vaccine (6 - 2023-24 season) 04/01/2023   DTaP/Tdap/Td vaccine (2 - Td or Tdap) 05/27/2023   Medicare Annual Wellness Visit  05/01/2024   Colon Cancer Screening  02/13/2025   Pneumonia Vaccine  Completed   Flu Shot  Completed   DEXA scan (bone density measurement)  Completed   Hepatitis C Screening  Completed   Zoster (Shingles) Vaccine  Completed   HPV Vaccine  Aged Out    Next appointment: Follow up in one year for your annual wellness visit.   Preventive Care 30 Years and Older, Female Preventive care refers to lifestyle choices and visits with your health care provider that can promote health and wellness. What does preventive care include? A yearly physical exam. This is also called an annual well check. Dental exams once or twice a year. Routine eye exams. Ask your health care provider how often you should have your eyes checked. Personal lifestyle choices, including: Daily care of your teeth and gums. Regular physical activity. Eating a healthy diet. Avoiding tobacco and drug use. Limiting alcohol use. Practicing safe sex. Taking low-dose aspirin every day. Taking vitamin and mineral supplements as recommended by your health care provider. What happens during an annual well check? The services and screenings done by your health care provider during your annual well check will depend on your age, overall health, lifestyle risk factors, and family history of disease. Counseling  Your health care provider may ask you questions about your: Alcohol use. Tobacco use. Drug use. Emotional well-being. Home and relationship  well-being. Sexual activity. Eating habits. History of falls. Memory and ability to understand (cognition). Work and work Astronomer. Reproductive health. Screening  You may have the following tests or measurements: Height, weight, and BMI. Blood pressure. Lipid and cholesterol levels. These may be checked every 5 years, or more frequently if you are over 35 years old. Skin check. Lung cancer screening. You may have this screening every year starting at age 48 if you have a 30-pack-year history of smoking and currently smoke or have quit within the past 15 years. Fecal occult blood test (FOBT) of the stool. You may have this test every year starting at age 64. Flexible sigmoidoscopy or colonoscopy. You may have a sigmoidoscopy every 5 years or a colonoscopy every 10 years starting at age 23. Hepatitis C blood test. Hepatitis B blood test. Sexually transmitted disease (STD) testing. Diabetes screening. This is done by checking your blood sugar (glucose) after you have not eaten for a while (fasting). You may have this done every 1-3 years. Bone density scan. This is done to screen for osteoporosis. You may have this done starting at age 15. Mammogram. This may be done every 1-2 years. Talk to your health care provider about how often you should have regular mammograms. Talk with your health care provider about your test results, treatment options, and if necessary, the need for more tests. Vaccines  Your health care provider may recommend certain vaccines, such as: Influenza vaccine. This is recommended every year. Tetanus, diphtheria, and acellular pertussis (Tdap, Td) vaccine. You may need a Td  booster every 10 years. Zoster vaccine. You may need this after age 44. Pneumococcal 13-valent conjugate (PCV13) vaccine. One dose is recommended after age 64. Pneumococcal polysaccharide (PPSV23) vaccine. One dose is recommended after age 37. Talk to your health care provider about which  screenings and vaccines you need and how often you need them. This information is not intended to replace advice given to you by your health care provider. Make sure you discuss any questions you have with your health care provider. Document Released: 08/13/2015 Document Revised: 04/05/2016 Document Reviewed: 05/18/2015 Elsevier Interactive Patient Education  2017 [employer].  Fall Prevention in the Home Falls can cause injuries. They can happen to people of all ages. There are many things you can do to make your home safe and to help prevent falls. What can I do on the outside of my home? Regularly fix the edges of walkways and driveways and fix any cracks. Remove anything that might make you trip as you walk through a door, such as a raised step or threshold. Trim any bushes or trees on the path to your home. Use bright outdoor lighting. Clear any walking paths of anything that might make someone trip, such as rocks or tools. Regularly check to see if handrails are loose or broken. Make sure that both sides of any steps have handrails. Any raised decks and porches should have guardrails on the edges. Have any leaves, snow, or ice cleared regularly. Use sand or salt on walking paths during winter. Clean up any spills in your garage right away. This includes oil or grease spills. What can I do in the bathroom? Use night lights. Install grab bars by the toilet and in the tub and shower. Do not use towel bars as grab bars. Use non-skid mats or decals in the tub or shower. If you need to sit down in the shower, use a plastic, non-slip stool. Keep the floor dry. Clean up any water that spills on the floor as soon as it happens. Remove soap buildup in the tub or shower regularly. Attach bath mats securely with double-sided non-slip rug tape. Do not have throw rugs and other things on the floor that can make you trip. What can I do in the bedroom? Use night lights. Make sure that you have a  light by your bed that is easy to reach. Do not use any sheets or blankets that are too big for your bed. They should not hang down onto the floor. Have a firm chair that has side arms. You can use this for support while you get dressed. Do not have throw rugs and other things on the floor that can make you trip. What can I do in the kitchen? Clean up any spills right away. Avoid walking on wet floors. Keep items that you use a lot in easy-to-reach places. If you need to reach something above you, use a strong step stool that has a grab bar. Keep electrical cords out of the way. Do not use floor polish or wax that makes floors slippery. If you must use wax, use non-skid floor wax. Do not have throw rugs and other things on the floor that can make you trip. What can I do with my stairs? Do not leave any items on the stairs. Make sure that there are handrails on both sides of the stairs and use them. Fix handrails that are broken or loose. Make sure that handrails are as long as the stairways. Check any carpeting  to make sure that it is firmly attached to the stairs. Fix any carpet that is loose or worn. Avoid having throw rugs at the top or bottom of the stairs. If you do have throw rugs, attach them to the floor with carpet tape. Make sure that you have a light switch at the top of the stairs and the bottom of the stairs. If you do not have them, ask someone to add them for you. What else can I do to help prevent falls? Wear shoes that: Do not have high heels. Have rubber bottoms. Are comfortable and fit you well. Are closed at the toe. Do not wear sandals. If you use a stepladder: Make sure that it is fully opened. Do not climb a closed stepladder. Make sure that both sides of the stepladder are locked into place. Ask someone to hold it for you, if possible. Clearly mark and make sure that you can see: Any grab bars or handrails. First and last steps. Where the edge of each step  is. Use tools that help you move around (mobility aids) if they are needed. These include: Canes. Walkers. Scooters. Crutches. Turn on the lights when you go into a dark area. Replace any light bulbs as soon as they burn out. Set up your furniture so you have a clear path. Avoid moving your furniture around. If any of your floors are uneven, fix them. If there are any pets around you, be aware of where they are. Review your medicines with your doctor. Some medicines can make you feel dizzy. This can increase your chance of falling. Ask your doctor what other things that you can do to help prevent falls. This information is not intended to replace advice given to you by your health care provider. Make sure you discuss any questions you have with your health care provider. Document Released: 05/13/2009 Document Revised: 12/23/2015 Document Reviewed: 08/21/2014 Elsevier Interactive Patient Education  2017 [employer].

## 2023-05-16 DIAGNOSIS — R251 Tremor, unspecified: Secondary | ICD-10-CM | POA: Diagnosis not present

## 2023-05-16 DIAGNOSIS — D631 Anemia in chronic kidney disease: Secondary | ICD-10-CM | POA: Diagnosis not present

## 2023-05-16 DIAGNOSIS — I509 Heart failure, unspecified: Secondary | ICD-10-CM | POA: Diagnosis not present

## 2023-05-16 DIAGNOSIS — I129 Hypertensive chronic kidney disease with stage 1 through stage 4 chronic kidney disease, or unspecified chronic kidney disease: Secondary | ICD-10-CM | POA: Diagnosis not present

## 2023-05-16 DIAGNOSIS — E785 Hyperlipidemia, unspecified: Secondary | ICD-10-CM | POA: Diagnosis not present

## 2023-05-16 DIAGNOSIS — I6529 Occlusion and stenosis of unspecified carotid artery: Secondary | ICD-10-CM | POA: Diagnosis not present

## 2023-05-16 DIAGNOSIS — N1832 Chronic kidney disease, stage 3b: Secondary | ICD-10-CM | POA: Diagnosis not present

## 2023-05-16 LAB — LAB REPORT - SCANNED
Albumin, Urine POC: 40.9
Albumin/Creatinine Ratio, Urine, POC: 32
Creatinine, POC: 126.9 mg/dL
EGFR: 40

## 2023-05-17 ENCOUNTER — Telehealth: Payer: Self-pay | Admitting: Cardiology

## 2023-05-17 NOTE — Telephone Encounter (Signed)
Pt c/o Syncope: STAT if syncope occurred within 30 minutes and pt complains of lightheadedness [place] if episode of passing out, completely, today or in last 24 hours   Did you pass out today? No   When is the last time you passed out?  Last black out was in Sept    Has this occurred multiple times? Yes    Did you have any symptoms prior to passing out? No   Patient has blacked out four times since September and her kidney doctor requesting she reach out to cardiologist to schedule.

## 2023-05-17 NOTE — Telephone Encounter (Signed)
Spoke with Emily Mitchell, she stated that she has had "blacking out" spells. Her Nephrologist suggested that she get an appt with Dr. Tomie China. Sent to front desk for appt in [facility] per Emily Mitchell request.

## 2023-05-21 ENCOUNTER — Telehealth: Payer: Self-pay | Admitting: Cardiology

## 2023-05-21 ENCOUNTER — Other Ambulatory Visit: Payer: Self-pay | Admitting: Family Medicine

## 2023-05-21 NOTE — Telephone Encounter (Signed)
LVM for pt to schedule and appt with [facility] in [place] 05/21/23    Message originally received from [facility] "Nephrologist recommended that pt make appt with Cardiologist [facility] [place] for passing out episodes. TY"

## 2023-05-25 ENCOUNTER — Telehealth: Payer: Self-pay | Admitting: Family Medicine

## 2023-05-25 NOTE — Telephone Encounter (Signed)
Copied from CRM [id number]. Topic: Medicare AWV >> May 25, 2023  1:57 PM Payton Doughty wrote: Reason for CRM: Called LVM 05/25/19 24 to schedule Annual Wellness Visit  Verlee Rossetti; Care Guide Ambulatory Clinical Support [place] [facility] Direct Dial: [phone]

## 2023-05-31 DIAGNOSIS — Z1231 Encounter for screening mammogram for malignant neoplasm of breast: Secondary | ICD-10-CM | POA: Diagnosis not present

## 2023-05-31 LAB — HM MAMMOGRAPHY

## 2023-06-05 ENCOUNTER — Encounter: Payer: Self-pay | Admitting: Family Medicine

## 2023-06-07 ENCOUNTER — Ambulatory Visit: Payer: Medicare Other | Admitting: Adult Health

## 2023-06-08 ENCOUNTER — Ambulatory Visit: Payer: Medicare Other | Admitting: Adult Health

## 2023-06-08 ENCOUNTER — Other Ambulatory Visit: Payer: Self-pay | Admitting: *Deleted

## 2023-06-08 DIAGNOSIS — J441 Chronic obstructive pulmonary disease with (acute) exacerbation: Secondary | ICD-10-CM

## 2023-06-09 ENCOUNTER — Other Ambulatory Visit: Payer: Self-pay | Admitting: Family Medicine

## 2023-06-11 ENCOUNTER — Ambulatory Visit (INDEPENDENT_AMBULATORY_CARE_PROVIDER_SITE_OTHER): Payer: Medicare Other | Admitting: Adult Health

## 2023-06-11 ENCOUNTER — Ambulatory Visit: Payer: Medicare Other | Admitting: Pulmonary Disease

## 2023-06-11 ENCOUNTER — Ambulatory Visit: Payer: Medicare Other

## 2023-06-11 ENCOUNTER — Encounter: Payer: Self-pay | Admitting: Adult Health

## 2023-06-11 VITALS — BP 128/66 | HR 77 | Temp 97.8°F | Ht 63.0 in | Wt 182.0 lb

## 2023-06-11 DIAGNOSIS — R55 Syncope and collapse: Secondary | ICD-10-CM | POA: Insufficient documentation

## 2023-06-11 DIAGNOSIS — J441 Chronic obstructive pulmonary disease with (acute) exacerbation: Secondary | ICD-10-CM

## 2023-06-11 DIAGNOSIS — J449 Chronic obstructive pulmonary disease, unspecified: Secondary | ICD-10-CM | POA: Diagnosis not present

## 2023-06-11 DIAGNOSIS — J309 Allergic rhinitis, unspecified: Secondary | ICD-10-CM

## 2023-06-11 HISTORY — DX: Syncope and collapse: R55

## 2023-06-11 LAB — PULMONARY FUNCTION TEST
DL/VA % pred: 101 %
DL/VA: 4.21 ml/min/mmHg/L
DLCO cor % pred: 78 %
DLCO cor: 14.58 ml/min/mmHg
DLCO unc % pred: 78 %
DLCO unc: 14.58 ml/min/mmHg
FEF 25-75 Post: 1.11 L/s
FEF 25-75 Pre: 1.29 L/s
FEF2575-%Change-Post: -14 %
FEF2575-%Pred-Post: 68 %
FEF2575-%Pred-Pre: 80 %
FEV1-%Change-Post: -2 %
FEV1-%Pred-Post: 72 %
FEV1-%Pred-Pre: 74 %
FEV1-Post: 1.47 L
FEV1-Pre: 1.5 L
FEV1FVC-%Change-Post: 0 %
FEV1FVC-%Pred-Pre: 102 %
FEV6-%Change-Post: -1 %
FEV6-%Pred-Post: 74 %
FEV6-%Pred-Pre: 76 %
FEV6-Post: 1.91 L
FEV6-Pre: 1.95 L
FEV6FVC-%Change-Post: 0 %
FEV6FVC-%Pred-Post: 105 %
FEV6FVC-%Pred-Pre: 105 %
FVC-%Change-Post: -2 %
FVC-%Pred-Post: 71 %
FVC-%Pred-Pre: 72 %
FVC-Post: 1.91 L
FVC-Pre: 1.95 L
Post FEV1/FVC ratio: 77 %
Post FEV6/FVC ratio: 100 %
Pre FEV1/FVC ratio: 77 %
Pre FEV6/FVC Ratio: 100 %
RV % pred: 72 %
RV: 1.62 L
TLC % pred: 76 %
TLC: 3.76 L

## 2023-06-11 NOTE — Assessment & Plan Note (Addendum)
Recurrent Syncopal episode last September 2024.  Currently undergoing evaluation with primary care with negative CT head.  She has an appointment with cardiology later this week.  Patient is advised if symptoms return to seek emergency room care.

## 2023-06-11 NOTE — Patient Instructions (Signed)
Full PFT performed today. °

## 2023-06-11 NOTE — Patient Instructions (Addendum)
Continue on BREZTRI 2 puffs Twice daily, rinse after use.  Continue on Singulair daily.  Albuterol inhaler As needed   Activity as tolerated .  Chest xray today.  Follow up with Cardiology and PCP as discussed  Follow up with Dr. Vassie Loll in 6 months -Virtual visit.

## 2023-06-11 NOTE — Assessment & Plan Note (Signed)
Moderate COPD with asthma currently well-controlled on Breztri. PFTs appear stable. Check chest x-ray today  Plan  Patient Instructions  Continue on BREZTRI 2 puffs Twice daily, rinse after use.  Continue on Singulair daily.  Albuterol inhaler As needed   Activity as tolerated .  Chest xray today.  Follow up with Cardiology and PCP as discussed  Follow up with Dr. Vassie Loll in 6 months -Virtual visit.

## 2023-06-11 NOTE — Assessment & Plan Note (Signed)
Continue on current regimen .   

## 2023-06-11 NOTE — Progress Notes (Signed)
Full PFT performed today. °

## 2023-06-11 NOTE — Progress Notes (Signed)
@Patient  ID: Emily Mitchell, female    DOB: [birth year], 77 y.o.   MRN: [id number]  Chief Complaint  Patient presents with   Follow-up   Discussed the use of AI scribe software for clinical note transcription with the patient, who gave verbal consent to proceed.   Referring provider: Bradd Canary, MD  HPI: 77 yo female former smoker followed for COPD with Asthma  Medical history significant for PE in 2014 on Coumadin 2014-2019  History of mild OSA previously on CPAP -stopped after weight loss  CKD stage IV   TEST/EVENTS :  PSG at [facility] 08/2011 -217 lbs -AHI 4/h, TST 319 mins,supine    PSG 07/2013 -208 lbs -showed mild OSA TST - 390 mins,  AHI  6/h, RDI of 8/h The lowest desaturation was 87%      Spirometry 04/2013: FEV1 72% predicted FEV1 FVC ratio 73 Spirometry  10/2017  severe airway obstruction with ratio 60, FEV1 of 41% and FVC of 52%    Echo 08/2014 grade 2 DD   12/2014 ONO - no desatn >> dc O2  06/11/2023 Follow up: COPD with Asthma  Patient presents for a 18-month follow-up she is followed for COPD with asthma.  PFTs today show stable lung function (improved since 2019) but FEV1 at 74%, ratio 77, FVC 72%, DLCO 78%.  We discussed her PFT results in detail.  She does remain on Breztri twice daily.  Overall she says her breathing has been doing well.  She denies any flare of cough or wheezing.  Says her shortness of breath is at baseline.  She does get winded with heavy activity.  She has had no recent COPD exacerbations .  She has not been on any recent steroid tapers.  She does remain on Singulair daily.  Denies any flare of her chronic allergy symptoms.  She has been following up with her primary care provider for episodes of syncope last in September.  She has a follow-up with cardiology later this week.  She describes unpredictable episodes where she passes out.  She did have a CT head of April 09, 2023 that was negative for acute process. Patient was advised if  these episodes recur she is to seek emergency room attention.  Keep follow-up with cardiology and primary care.  She denies any shortness of breath, cough, congestion, hemoptysis.      Allergies  Allergen Reactions   Niaspan [Niacin Er (Antihyperlipidemic)] Nausea And Vomiting and Swelling    Swelling in mouth   Pantoprazole     Mouth sores   Rosuvastatin Calcium Anaphylaxis   Sulfonamide Derivatives Hives   Bupropion Other (See Comments)    Uncontrollable shakes   Nitroglycerin     NOT allergic - cardiology has recommended she NOT use this due to h/o severe carotid disease as it may decrease cerebral perfusion   Penicillins Hives    DID THE REACTION INVOLVE: Swelling of the face/tongue/throat, SOB, or low BP? Yes Sudden or severe rash/hives, skin peeling, or the inside of the mouth or nose? Unknown Did it require medical treatment? Unknown When did it last happen?   [age]  If all above answers are "NO", may proceed with cephalosporin use.   Statins    Apple Rash   Apple Juice Rash   Banana Rash    Immunization History  Administered Date(s) Administered   Fluad Quad(high Dose 65+) 04/17/2019, 04/14/2020, 04/11/2022   Influenza Split 04/29/2013   Influenza, High Dose Seasonal PF 03/30/2016, 04/19/2017, 05/02/2018, 04/30/2023  Influenza,inj,Quad PF,6+ Mos 03/26/2014, 05/17/2015   Influenza-Unspecified 06/14/2021   PFIZER(Purple Top)SARS-COV-2 Vaccination 10/19/2019, 11/10/2019, 05/08/2020, 10/30/2020   Pfizer Covid-19 Vaccine Bivalent Booster 14yrs & up 05/18/2021   Pneumococcal Conjugate-13 04/29/2013   Pneumococcal Polysaccharide-23 04/30/2014, 04/17/2019   Respiratory Syncytial Virus Vaccine,Recomb Aduvanted(Arexvy) 06/14/2022   Tdap 05/26/2013   Zoster Recombinant(Shingrix) 04/27/2017, 06/13/2017   Zoster, Live 07/31/2012    Past Medical History:  Diagnosis Date   Acid reflux    Allergic rhinitis 05/19/2022   Anemia 07/05/2014   Anxiety and depression 12/14/2008    Qualifier: Diagnosis of  By: Hart Rochester LPN, Raynelle Fanning     Arthritis    "fingers, toes, knees, joints" (07/18/2018)   Asthma    Atherosclerotic heart disease of native coronary artery without angina pectoris 07/19/2018   Atypical chest pain 05/23/2015   Back pain 02/02/2019   Breast mass, left 04/28/2019   CAD in native artery 07/19/2018   Candidal skin infection 02/28/2018   Carotid artery disease (HCC) 06/16/2015   S/p CEA    Carotid artery occlusion    a. R carotid occluded, 70-99% LICA.   Chest pain 07/18/2018   CHF (congestive heart failure) (HCC)    Chicken pox as a child   Chronic kidney disease    Bright's Disease at age 5 , stage 4   Chronic obstructive pulmonary disease (HCC) 12/14/2008   Formatting of this note might be different from the original. Overview:  Spirometry 05/12/2013: FEV1 72% predicted FEV1 FVC ratio 73 predicted FEF 25 75  43% predicted  Last Assessment & Plan:  OK to stop taking advair Use albuterol as needed only Check oxygen levels during sleep - we will call you with results Call if symptoms worse   Chronic rhinitis 06/14/2018   CKD (chronic kidney disease), stage IV (HCC)    Follows with [facility], Dr Oley Balm    Congestive heart failure (HCC) 11/30/2015   Formatting of this note might be different from the original. Last Assessment & Plan:  No recent exacerbation   Constipation 12/17/2008   Qualifier: Diagnosis of  By: De Blanch    COPD with asthma (HCC) 11/06/2017   Dry eyes 11/30/2015   Essential hypertension 10/18/2015   Formatting of this note might be different from the original. Last Assessment & Plan:  poorlycontrolled and under a great deal of stress, add Metoprolol XL 25 mg daily. Encouraged heart healthy diet such as the DASH diet and exercise as tolerated.   Essential tremor 11/05/2013   Exudative age-related macular degeneration of right eye with active choroidal neovascularization (HCC) 08/26/2015   Gastro-esophageal  reflux disease without esophagitis 12/14/2008   Formatting of this note might be different from the original. Formatting of this note might be different from the original. Formatting of this note might be different from the original. Overview:  Qualifier: Diagnosis of  By: Hart Rochester LPN, Raynelle Fanning   Last Assessment & Plan:  Avoid offending foods, start probiotics. Do not eat large meals in late evening and consider raising head of bed. Last Assessment   GERD (gastroesophageal reflux disease)    H. pylori infection 10/19/2013   H/O multiple allergies 07/14/2018   Helicobacter pylori (H. pylori) as the cause of diseases classified elsewhere 10/19/2013   Formatting of this note might be different from the original. Last Assessment & Plan:  Tetracycline, Pepto Bismol, Flagyl and Omeprazole.   Hepatitis 1970s   "don't know for sure which one it was; think it was A" (07/18/2018)  History of food allergy 08/14/2018   History of pulmonary embolism 07/17/2018   Hoarseness 03/09/2019   Hypercalcemia 03/09/2019   Hyperglycemia 03/14/2016   Hyperlipidemia, mixed 05/26/2013   Crestor caused N/V Did not tolerate Lipitor    Knee pain, bilateral 12/17/2012   Left hip pain 05/23/2015   Low back pain 09/19/2015   Lower urinary tract infectious disease 10/19/2013   Lymphadenopathy 10/18/2017   Macular degeneration of both eyes 12/16/2015   Right eye is wet Left Eye is dry Shots every 11 to 12 weeks in right eye at opthamologist office   Medicare annual wellness visit, subsequent 02/21/2015   Mixed anxiety depressive disorder 12/14/2008   Formatting of this note might be different from the original. Overview:  Qualifier: Diagnosis of  By: Hart Rochester LPN, Raynelle Fanning  Patient had increased  Tremors on Bupropion  Last Assessment & Plan:  Is struggling with the stress of her 74 year old granddaughter whom she raised moving back with her own mother, the patient is estranged from her daughter so she is very upset. Increase  Citalopram 40 mg daily    Mumps 77 yrs old   Neck pain 04/22/2017   Numbness of finger 12/17/2012   Occlusion and stenosis of carotid artery without mention of cerebral infarction 12/17/2012   OSA (obstructive sleep apnea) 12/14/2008   PSG  06/2013-AHI 6/h 08/2011 (bethany) AHI 4/h No longer needs CPAP    Preventative health care 03/14/2016   Pulmonary emboli (HCC) 05/26/2013   Recurrent infection of skin 07/19/2021   Rheumatoid factor positive 09/27/2021   Right shoulder pain 10/18/2017   Sensation of foreign body in larynx 04/08/2019   Skin lesion of back 03/27/2021   Sleep apnea    "don't use CPAP anymore; dr said I didn't have to" (07/18/2018)   Static tremor 11/05/2013   Formatting of this note might be different from the original. Last Assessment & Plan:  Follows with [facility]   Statin intolerance 09/29/2019   Stroke [facility])    "been told I've had some strokes; didn't know I'd had them" (07/18/2018)   Thyroid disease    Vitamin D deficiency 03/03/2018   Vitreomacular adhesion of both eyes 01/07/2019   Formatting of this note might be different from the original. no traction    Tobacco History: Social History   Tobacco Use  Smoking Status Former   Current packs/day: 0.00   Average packs/day: 2.0 packs/day for 30.0 years (60.0 ttl pk-yrs)   Types: Cigarettes   Start date: 12/02/1964   Quit date: 12/03/1994   Years since quitting: 28.5  Smokeless Tobacco Never  Tobacco Comments   Quit smoking 26 years ago   Counseling given: Not Answered Tobacco comments: Quit smoking 26 years ago   Outpatient Medications Prior to Visit  Medication Sig Dispense Refill   acetaminophen (TYLENOL) 500 MG tablet Take 500 mg by mouth as needed for headache.     albuterol (VENTOLIN HFA) 108 (90 Base) MCG/ACT inhaler Inhale 2 puffs into the lungs every 6 (six) hours as needed for wheezing or shortness of breath. 18 g 3   aspirin EC 81 MG tablet Take 1 tablet (81 mg total) by mouth daily. 90  tablet 3   atorvastatin (LIPITOR) 10 MG tablet Take 1 tablet (10 mg total) by mouth daily. 90 tablet 0   BREZTRI AEROSPHERE 160-9-4.8 MCG/ACT AERO INHALE 2 PUFFS TWICE DAILY IN MORNING AND IN THE EVENING 10.7 each 4   Calcium Citrate-Vitamin D 200-250 MG-UNIT TABS Take 1  tablet by mouth daily.     Cholecalciferol 25 MCG (1000 UT) tablet Take 1,000 Units by mouth daily.     Choline Fenofibrate (FENOFIBRIC ACID) 135 MG CPDR TAKE 1 CAPSULE BY MOUTH EVERY DAY 90 capsule 1   clonazePAM (KLONOPIN) 1 MG tablet TAKE 1 TABLET BY MOUTH 3 TIMES DAILY AS NEEDED. FOR ANXIETY 90 tablet 0   cyanocobalamin 100 MCG tablet Take 500 mcg by mouth daily.     cyclobenzaprine (FLEXERIL) 10 MG tablet Take 1 tablet (10 mg total) by mouth at bedtime as needed. 30 tablet 1   esomeprazole (NEXIUM) 40 MG capsule TAKE 1 CAPSULE (40 MG TOTAL) BY MOUTH DAILY. 90 capsule 1   ezetimibe (ZETIA) 10 MG tablet TAKE 1 TABLET BY MOUTH EVERY DAY 90 tablet 1   famotidine (PEPCID) 40 MG tablet TAKE 1 TABLET BY MOUTH EVERY DAY 90 tablet 1   FLUoxetine (PROZAC) 20 MG tablet TAKE 1 TABLET BY MOUTH EVERY DAY 90 tablet 1   gabapentin (NEURONTIN) 100 MG capsule TAKE 2 CAPSULES BY MOUTH AT BEDTIME 180 capsule 1   levothyroxine (SYNTHROID) 100 MCG tablet TAKE 1 TABLET BY MOUTH EVERY DAY 90 tablet 2   montelukast (SINGULAIR) 10 MG tablet TAKE 1 TABLET BY MOUTH AT BEDTIME AS NEEDED. 90 tablet 1   NON FORMULARY Take 1 tablet by mouth daily. Digestive advantage.     Omega-3 Fatty Acids (FISH OIL PO) Take 1 capsule by mouth daily.     primidone (MYSOLINE) 50 MG tablet Take 1 tablet (50 mg total) by mouth 2 (two) times daily. 180 tablet 1   Probiotic Product (VSL#3) CAPS Take 1 capsule by mouth daily.     No facility-administered medications prior to visit.     Review of Systems:   Constitutional:   No  weight loss, night sweats,  Fevers, chills, +fatigue, or  lassitude.  HEENT:   No headaches,  Difficulty swallowing,  Tooth/dental problems,  or  Sore throat,                No sneezing, itching, ear ache, nasal congestion, post nasal drip,   CV:  No chest pain,  Orthopnea, PND, swelling in lower extremities, anasarca, dizziness, palpitations, syncope.   GI  No heartburn, indigestion, abdominal pain, nausea, vomiting, diarrhea, change in bowel habits, loss of appetite, bloody stools.   Resp: .  No chest wall deformity  Skin: no rash or lesions.  GU: no dysuria, change in color of urine, no urgency or frequency.  No flank pain, no hematuria   MS:  No joint pain or swelling.  No decreased range of motion.  No back pain.    Physical Exam  BP 128/66 (BP Location: Left Arm, Patient Position: Sitting, Cuff Size: Normal)   Pulse 77   Temp 97.8 F (36.6 C) (Oral)   Ht 5\' 3"  (1.6 m)   Wt 182 lb (82.6 kg)   SpO2 98%   BMI 32.24 kg/m   GEN: A/Ox3; pleasant , NAD, well nourished    HEENT:  [place]/AT,   NOSE-clear, THROAT-clear, no lesions, no postnasal drip or exudate noted.   NECK:  Supple w/ fair ROM; no JVD; normal carotid impulses w/o bruits; no thyromegaly or nodules palpated; no lymphadenopathy.    RESP  Clear  P & A; w/o, wheezes/ rales/ or rhonchi. no accessory muscle use, no dullness to percussion  CARD:  RRR, no m/r/g, no peripheral edema, pulses intact, no cyanosis or clubbing.  GI:   Soft &  nt; nml bowel sounds; no organomegaly or masses detected.   Musco: Warm bil, no deformities or joint swelling noted.   Neuro: alert, no focal deficits noted.    Skin: Warm, no lesions or rashes    Lab Results:  CBC        Imaging: No results found.  Administration History     None          Latest Ref Rng & Units 06/11/2023   12:43 PM  PFT Results  FVC-Pre L 1.95  P  FVC-Predicted Pre % 72  P  FVC-Post L 1.91  P  FVC-Predicted Post % 71  P  Pre FEV1/FVC % % 77  P  Post FEV1/FCV % % 77  P  FEV1-Pre L 1.50  P  FEV1-Predicted Pre % 74  P  FEV1-Post L 1.47  P  DLCO uncorrected ml/min/mmHg 14.58  P   DLCO UNC% % 78  P  DLCO corrected ml/min/mmHg 14.58  P  DLCO COR %Predicted % 78  P  DLVA Predicted % 101  P  TLC L 3.76  P  TLC % Predicted % 76  P  RV % Predicted % 72  P    P Preliminary result    No results found for: "NITRICOXIDE"      Assessment & Plan:   Chronic obstructive pulmonary disease (HCC) Moderate COPD with asthma currently well-controlled on Breztri. PFTs appear stable. Check chest x-ray today  Plan  Patient Instructions  Continue on BREZTRI 2 puffs Twice daily, rinse after use.  Continue on Singulair daily.  Albuterol inhaler As needed   Activity as tolerated .  Chest xray today.  Follow up with Cardiology and PCP as discussed  Follow up with Dr. Vassie Loll in 6 months -Virtual visit.    Allergic rhinitis Continue on current regimen  Syncope Recurrent Syncopal episode last September 2024.  Currently undergoing evaluation with primary care with negative CT head.  She has an appointment with cardiology later this week.  Patient is advised if symptoms return to seek emergency room care.     Rubye Oaks, NP 06/11/2023

## 2023-06-13 ENCOUNTER — Encounter (HOSPITAL_BASED_OUTPATIENT_CLINIC_OR_DEPARTMENT_OTHER): Payer: Self-pay

## 2023-06-21 DIAGNOSIS — H353211 Exudative age-related macular degeneration, right eye, with active choroidal neovascularization: Secondary | ICD-10-CM | POA: Diagnosis not present

## 2023-06-21 DIAGNOSIS — H35363 Drusen (degenerative) of macula, bilateral: Secondary | ICD-10-CM | POA: Diagnosis not present

## 2023-06-21 DIAGNOSIS — H43822 Vitreomacular adhesion, left eye: Secondary | ICD-10-CM | POA: Diagnosis not present

## 2023-06-21 DIAGNOSIS — H353222 Exudative age-related macular degeneration, left eye, with inactive choroidal neovascularization: Secondary | ICD-10-CM | POA: Diagnosis not present

## 2023-06-27 ENCOUNTER — Encounter: Payer: Self-pay | Admitting: Cardiology

## 2023-06-27 ENCOUNTER — Ambulatory Visit: Payer: Medicare Other | Attending: Cardiology | Admitting: Cardiology

## 2023-06-27 VITALS — BP 134/76 | HR 74 | Ht 63.0 in | Wt 180.1 lb

## 2023-06-27 DIAGNOSIS — E782 Mixed hyperlipidemia: Secondary | ICD-10-CM

## 2023-06-27 NOTE — Patient Instructions (Signed)

## 2023-06-27 NOTE — Progress Notes (Signed)
Cardiology Office Note:    Date:  06/27/2023   ID:  Emily Mitchell, DOB 1945-09-21, MRN [id number]  PCP:  Bradd Canary, MD  Cardiologist:  Garwin Brothers, MD   Referring MD: Bradd Canary, MD    ASSESSMENT:    1. Hyperlipidemia, mixed    PLAN:    In order of problems listed above:  Coronary artery disease: Secondary prevention stressed to the patient.  Importance of compliance with diet medications stressed and she was placed on aspirin.  She was advised to walk at least half an hour a day on a daily basis. Essential hypertension: Blood pressure is stable and diet was emphasized.  Lifestyle modification urged. Mixed dyslipidemia: On lipid-lowering medications followed by primary care.  Lipids were reviewed and discussed with her. Obesity: Weight reduction stressed diet emphasized.  I urged her against sedentary lifestyle.  She promises to do better. Patient will be seen in follow-up appointment in 12 months or earlier if the patient has any concerns.    Medication Adjustments/Labs and Tests Ordered: Current medicines are reviewed at length with the patient today.  Concerns regarding medicines are outlined above.  Orders Placed This Encounter  Procedures   EKG 12-Lead   No orders of the defined types were placed in this encounter.    No chief complaint on file.    History of Present Illness:    Emily Mitchell is a 77 y.o. female.  Patient has past medical history of coronary artery disease, atherosclerotic cerebrovascular disease, essential hypertension, mixed dyslipidemia and renal insufficiency.  She denies any problems at this time and takes care of her activities of daily living.  No chest pain orthopnea PND.  At the time of my evaluation, the patient is alert awake oriented and in no distress.  Past Medical History:  Diagnosis Date   Acid reflux    Allergic rhinitis 05/19/2022   Anemia 07/05/2014   Anxiety and depression 12/14/2008   Qualifier: Diagnosis  of  By: Hart Rochester LPN, Raynelle Fanning     Arthritis    "fingers, toes, knees, joints" (07/18/2018)   Asthma    Atherosclerotic heart disease of native coronary artery without angina pectoris 07/19/2018   Atypical chest pain 05/23/2015   Back pain 02/02/2019   Breast mass, left 04/28/2019   CAD in native artery 07/19/2018   Candidal skin infection 02/28/2018   Carotid artery disease (HCC) 06/16/2015   S/p CEA    CHF (congestive heart failure) (HCC)    Chicken pox as a child   Chronic obstructive pulmonary disease (HCC) 12/14/2008   Formatting of this note might be different from the original. Overview:  Spirometry 05/12/2013: FEV1 72% predicted FEV1 FVC ratio 73 predicted FEF 25 75  43% predicted  Last Assessment & Plan:  OK to stop taking advair Use albuterol as needed only Check oxygen levels during sleep - we will call you with results Call if symptoms worse   Chronic rhinitis 06/14/2018   CKD (chronic kidney disease), stage IV (HCC)    Follows with [facility], Dr Oley Balm    Congestive heart failure (HCC) 11/30/2015   Formatting of this note might be different from the original. Last Assessment & Plan:  No recent exacerbation   Constipation 12/17/2008   Qualifier: Diagnosis of  By: De Blanch    COPD with asthma (HCC) 11/06/2017   Dry eyes 11/30/2015   Essential hypertension 10/18/2015   Formatting of this note might be different from the  original. Last Assessment & Plan:  poorlycontrolled and under a great deal of stress, add Metoprolol XL 25 mg daily. Encouraged heart healthy diet such as the DASH diet and exercise as tolerated.   Essential tremor 11/05/2013   Exudative age-related macular degeneration of right eye with active choroidal neovascularization (HCC) 08/26/2015   Gastro-esophageal reflux disease without esophagitis 12/14/2008   Formatting of this note might be different from the original. Formatting of this note might be different from the original.  Formatting of this note might be different from the original. Overview:  Qualifier: Diagnosis of  By: Hart Rochester LPN, Raynelle Fanning   Last Assessment & Plan:  Avoid offending foods, start probiotics. Do not eat large meals in late evening and consider raising head of bed. Last Assessment   GERD (gastroesophageal reflux disease)    H. pylori infection 10/19/2013   H/O multiple allergies 07/14/2018   Helicobacter pylori (H. pylori) as the cause of diseases classified elsewhere 10/19/2013   Formatting of this note might be different from the original. Last Assessment & Plan:  Tetracycline, Pepto Bismol, Flagyl and Omeprazole.   Hepatitis 1970s   "don't know for sure which one it was; think it was A" (07/18/2018)   History of food allergy 08/14/2018   History of pulmonary embolism 07/17/2018   Hoarseness 03/09/2019   Hypercalcemia 03/09/2019   Hyperglycemia 03/14/2016   Hyperlipidemia, mixed 05/26/2013   Crestor caused N/V Did not tolerate Lipitor    Knee pain, bilateral 12/17/2012   Left hip pain 05/23/2015   Low back pain 09/19/2015   Lower urinary tract infectious disease 10/19/2013   Lymphadenopathy 10/18/2017   Macular degeneration of both eyes 12/16/2015   Right eye is wet Left Eye is dry Shots every 11 to 12 weeks in right eye at opthamologist office   Medicare annual wellness visit, subsequent 02/21/2015   Mixed anxiety depressive disorder 12/14/2008   Formatting of this note might be different from the original. Overview:  Qualifier: Diagnosis of  By: Hart Rochester LPN, Raynelle Fanning  Patient had increased  Tremors on Bupropion  Last Assessment & Plan:  Is struggling with the stress of her 20 year old granddaughter whom she raised moving back with her own mother, the patient is estranged from her daughter so she is very upset. Increase Citalopram 40 mg daily    Mumps 77 yrs old   Neck pain 04/22/2017   Numbness of finger 12/17/2012   Occlusion and stenosis of carotid artery without mention of cerebral  infarction 12/17/2012   OSA (obstructive sleep apnea) 12/14/2008   PSG  06/2013-AHI 6/h 08/2011 (bethany) AHI 4/h No longer needs CPAP    Preventative health care 03/14/2016   Pulmonary emboli (HCC) 05/26/2013   Recurrent infection of skin 07/19/2021   Rheumatoid factor positive 09/27/2021   Right shoulder pain 10/18/2017   Sensation of foreign body in larynx 04/08/2019   Skin lesion of back 03/27/2021   Sleep apnea    "don't use CPAP anymore; dr said I didn't have to" (07/18/2018)   Static tremor 11/05/2013   Formatting of this note might be different from the original. Last Assessment & Plan:  Follows with [facility]   Statin intolerance 09/29/2019   Stroke [facility])    "been told I've had some strokes; didn't know I'd had them" (07/18/2018)   Syncope 06/11/2023   Thyroid disease    Vitamin D deficiency 03/03/2018   Vitreomacular adhesion of both eyes 01/07/2019   Formatting of this note might be different from the  original. no traction    Past Surgical History:  Procedure Laterality Date   APPENDECTOMY  1984   CARDIAC CATHETERIZATION  ~ 2006   CAROTID ANGIOGRAPHY Left 08/20/2018   Procedure: CAROTID ANGIOGRAPHY;  Surgeon: Nada Libman, MD;  Location: [facility];  Service: Cardiovascular;  Laterality: Left;   CAROTID ENDARTERECTOMY Right 05/10/2007   cea   CATARACT EXTRACTION Left    CATARACT EXTRACTION W/ INTRAOCULAR LENS  IMPLANT, BILATERAL Bilateral    COLONOSCOPY  09/25/2008   normal   COLONOSCOPY WITH PROPOFOL  02/13/2022   Lynann Bologna at [facility]   CORONARY ANGIOPLASTY WITH STENT PLACEMENT  07/18/2018   CORONARY PRESSURE/FFR STUDY N/A 07/18/2018   Procedure: INTRAVASCULAR PRESSURE WIRE/FFR STUDY;  Surgeon: Runell Gess, MD;  Location: [facility];  Service: Cardiovascular;  Laterality: N/A;   CORONARY STENT INTERVENTION N/A 07/18/2018   Procedure: CORONARY STENT INTERVENTION;  Surgeon: Runell Gess, MD;  Location: [facility];  Service:  Cardiovascular;  Laterality: N/A;   JOINT REPLACEMENT     LEFT HEART CATH AND CORONARY ANGIOGRAPHY N/A 07/18/2018   Procedure: LEFT HEART CATH AND CORONARY ANGIOGRAPHY;  Surgeon: Runell Gess, MD;  Location: [facility];  Service: Cardiovascular;  Laterality: N/A;   MULTIPLE TOOTH EXTRACTIONS     TOTAL KNEE ARTHROPLASTY Bilateral 2005 - 2011   right - left   VAGINAL HYSTERECTOMY  1984   WISDOM TOOTH EXTRACTION      Current Medications: Current Meds  Medication Sig   acetaminophen (TYLENOL) 500 MG tablet Take 500 mg by mouth as needed for headache.   albuterol (VENTOLIN HFA) 108 (90 Base) MCG/ACT inhaler Inhale 2 puffs into the lungs every 6 (six) hours as needed for wheezing or shortness of breath.   aspirin EC 81 MG tablet Take 1 tablet (81 mg total) by mouth daily.   atorvastatin (LIPITOR) 10 MG tablet Take 1 tablet (10 mg total) by mouth daily.   BREZTRI AEROSPHERE 160-9-4.8 MCG/ACT AERO INHALE 2 PUFFS TWICE DAILY IN MORNING AND IN THE EVENING   Calcium Citrate-Vitamin D 200-250 MG-UNIT TABS Take 1 tablet by mouth daily.   Cholecalciferol 25 MCG (1000 UT) tablet Take 1,000 Units by mouth daily.   Choline Fenofibrate (FENOFIBRIC ACID) 135 MG CPDR TAKE 1 CAPSULE BY MOUTH EVERY DAY   clonazePAM (KLONOPIN) 1 MG tablet TAKE 1 TABLET BY MOUTH 3 TIMES DAILY AS NEEDED. FOR ANXIETY   cyanocobalamin 100 MCG tablet Take 500 mcg by mouth daily.   cyclobenzaprine (FLEXERIL) 10 MG tablet Take 1 tablet (10 mg total) by mouth at bedtime as needed.   esomeprazole (NEXIUM) 40 MG capsule TAKE 1 CAPSULE (40 MG TOTAL) BY MOUTH DAILY.   ezetimibe (ZETIA) 10 MG tablet TAKE 1 TABLET BY MOUTH EVERY DAY   famotidine (PEPCID) 40 MG tablet TAKE 1 TABLET BY MOUTH EVERY DAY   FLUoxetine (PROZAC) 20 MG tablet TAKE 1 TABLET BY MOUTH EVERY DAY   gabapentin (NEURONTIN) 100 MG capsule TAKE 2 CAPSULES BY MOUTH AT BEDTIME   levothyroxine (SYNTHROID) 100 MCG tablet TAKE 1 TABLET BY MOUTH EVERY DAY    montelukast (SINGULAIR) 10 MG tablet TAKE 1 TABLET BY MOUTH AT BEDTIME AS NEEDED.   NON FORMULARY Take 1 tablet by mouth daily. Digestive advantage.   Omega-3 Fatty Acids (FISH OIL PO) Take 1 capsule by mouth daily.   primidone (MYSOLINE) 50 MG tablet Take 1 tablet (50 mg total) by mouth 2 (two) times daily.  Probiotic Product (VSL#3) CAPS Take 1 capsule by mouth daily.     Allergies:   Niaspan [niacin er (antihyperlipidemic)], Pantoprazole, Rosuvastatin calcium, Sulfonamide derivatives, Bupropion, Nitroglycerin, Penicillins, Statins, Apple, Apple juice, and Banana   Social History   Socioeconomic History   Marital status: Divorced    Spouse name: Not on file   Number of children: 2   Years of education: Not on file   Highest education level: Not on file  Occupational History   Not on file  Tobacco Use   Smoking status: Former    Current packs/day: 0.00    Average packs/day: 2.0 packs/day for 30.0 years (60.0 ttl pk-yrs)    Types: Cigarettes    Start date: 12/02/1964    Quit date: 12/03/1994    Years since quitting: 28.5   Smokeless tobacco: Never   Tobacco comments:    Quit smoking 26 years ago  Vaping Use   Vaping status: Never Used  Substance and Sexual Activity   Alcohol use: Yes    Comment: special occasions   Drug use: Never   Sexual activity: Not Currently    Birth control/protection: Post-menopausal    Comment: lives with son and friend, no dietary restrictions  Other Topics Concern   Not on file  Social History Narrative   Right handed   No caffeine   One story home   Social Determinants of Health   Financial Resource Strain: Low Risk  (05/02/2023)   Overall Financial Resource Strain (CARDIA)    Difficulty of Paying Living Expenses: Not hard at all  Food Insecurity: No Food Insecurity (05/02/2023)   Hunger Vital Sign    Worried About Running Out of Food in the Last Year: Never true    Ran Out of Food in the Last Year: Never true  Transportation Needs: No  Transportation Needs (05/02/2023)   PRAPARE - Administrator, Civil Service (Medical): No    Lack of Transportation (Non-Medical): No  Physical Activity: Inactive (05/02/2023)   Exercise Vital Sign    Days of Exercise per Week: 0 days    Minutes of Exercise per Session: 0 min  Stress: No Stress Concern Present (05/02/2023)   [employer] of Occupational Health - Occupational Stress Questionnaire    Feeling of Stress : Only a little  Social Connections: Socially Isolated (05/02/2023)   Social Connection and Isolation Panel [NHANES]    Frequency of Communication with Friends and Family: Once a week    Frequency of Social Gatherings with Friends and Family: More than three times a week    Attends Religious Services: Never    Database administrator or Organizations: No    Attends Engineer, structural: Never    Marital Status: Divorced     Family History: The patient's family history includes Allergic rhinitis in her sister; Diabetes in her sister; Heart attack in her maternal grandfather; Heart failure in her mother; Hyperlipidemia in her sister; Hypertension in her mother; Kidney disease in her mother and sister; Osteoarthritis in her sister; Stroke in her mother. There is no history of Colon cancer, Stomach cancer, or Rectal cancer.  ROS:   Please see the history of present illness.    All other systems reviewed and are negative.  EKGs/Labs/Other Studies Reviewed:    The following studies were reviewed today: .Marland KitchenEKG Interpretation Date/Time:  Wednesday June 27 2023 14:07:00 EST Ventricular Rate:  74 PR Interval:  216 QRS Duration:  64 QT Interval:  378 QTC Calculation: 419  R Axis:   10  Text Interpretation: Sinus rhythm with 1st degree A-V block Possible Inferior infarct (cited on or before 15-Feb-2021) Anterior infarct (cited on or before 15-Feb-2021) When compared with ECG of 15-Feb-2021 09:52, Questionable change in initial forces of Anterior leads  Confirmed by Belva Crome [phone]) on 06/27/2023 2:40:37 PM     Recent Labs: 08/14/2022: Magnesium 2.1 01/03/2023: ALT 15; BUN 37; Creatinine, Ser 1.82; Hemoglobin 12.4; Platelets 405.0; Potassium 5.0; Sodium 143; TSH 0.94  Recent Lipid Panel    Component Value Date/Time   CHOL 132 01/03/2023 1407   CHOL 143 04/12/2020 1137   TRIG 188.0 (H) 01/03/2023 1407   HDL 39.00 (L) 01/03/2023 1407   HDL 44 04/12/2020 1137   CHOLHDL 3 01/03/2023 1407   VLDL 37.6 01/03/2023 1407   LDLCALC 55 01/03/2023 1407   LDLCALC 71 04/12/2020 1137   LDLDIRECT 103.0 07/14/2019 1323    Physical Exam:    VS:  BP 134/76   Pulse 74   Ht 5\' 3"  (1.6 m)   Wt 180 lb 1.9 oz (81.7 kg)   SpO2 96%   BMI 31.91 kg/m     Wt Readings from Last 3 Encounters:  06/27/23 180 lb 1.9 oz (81.7 kg)  06/11/23 182 lb (82.6 kg)  05/02/23 181 lb (82.1 kg)     GEN: Patient is in no acute distress HEENT: Normal NECK: No JVD; No carotid bruits LYMPHATICS: No lymphadenopathy CARDIAC: Hear sounds regular, 2/6 systolic murmur at the apex. RESPIRATORY:  Clear to auscultation without rales, wheezing or rhonchi  ABDOMEN: Soft, non-tender, non-distended MUSCULOSKELETAL:  No edema; No deformity  SKIN: Warm and dry NEUROLOGIC:  Alert and oriented x 3 PSYCHIATRIC:  Normal affect   Signed, Garwin Brothers, MD  06/27/2023 2:42 PM    [facility]

## 2023-07-12 ENCOUNTER — Other Ambulatory Visit: Payer: Self-pay | Admitting: Neurology

## 2023-07-12 ENCOUNTER — Other Ambulatory Visit: Payer: Self-pay | Admitting: Family Medicine

## 2023-07-12 DIAGNOSIS — G250 Essential tremor: Secondary | ICD-10-CM

## 2023-07-20 ENCOUNTER — Telehealth: Payer: Self-pay

## 2023-07-20 NOTE — Telephone Encounter (Signed)
Called patient to confirm her appointment next week 12/23 with Ladona Ridgel

## 2023-07-20 NOTE — Telephone Encounter (Signed)
Copied from CRM [id number]. Topic: Appointments - Appointment Info/Confirmation >> Jul 20, 2023 11:12 AM Deaijah H wrote: Patient/patient representative is calling for information regarding an appointment. // patient called in to confirm she will be at her 12/23 appointment

## 2023-07-23 ENCOUNTER — Ambulatory Visit: Payer: Medicare Other | Admitting: Family Medicine

## 2023-07-26 ENCOUNTER — Encounter: Payer: Self-pay | Admitting: Family Medicine

## 2023-07-26 ENCOUNTER — Ambulatory Visit: Payer: Medicare Other | Admitting: Family Medicine

## 2023-07-26 ENCOUNTER — Telehealth: Payer: Self-pay

## 2023-07-26 VITALS — BP 159/56 | HR 71 | Ht 63.0 in | Wt 180.0 lb

## 2023-07-26 DIAGNOSIS — E559 Vitamin D deficiency, unspecified: Secondary | ICD-10-CM | POA: Diagnosis not present

## 2023-07-26 DIAGNOSIS — F419 Anxiety disorder, unspecified: Secondary | ICD-10-CM

## 2023-07-26 DIAGNOSIS — F32A Depression, unspecified: Secondary | ICD-10-CM

## 2023-07-26 DIAGNOSIS — J4489 Other specified chronic obstructive pulmonary disease: Secondary | ICD-10-CM

## 2023-07-26 DIAGNOSIS — I509 Heart failure, unspecified: Secondary | ICD-10-CM

## 2023-07-26 DIAGNOSIS — N184 Chronic kidney disease, stage 4 (severe): Secondary | ICD-10-CM | POA: Diagnosis not present

## 2023-07-26 DIAGNOSIS — I10 Essential (primary) hypertension: Secondary | ICD-10-CM

## 2023-07-26 DIAGNOSIS — E538 Deficiency of other specified B group vitamins: Secondary | ICD-10-CM | POA: Diagnosis not present

## 2023-07-26 DIAGNOSIS — K219 Gastro-esophageal reflux disease without esophagitis: Secondary | ICD-10-CM

## 2023-07-26 DIAGNOSIS — R739 Hyperglycemia, unspecified: Secondary | ICD-10-CM | POA: Diagnosis not present

## 2023-07-26 DIAGNOSIS — I6523 Occlusion and stenosis of bilateral carotid arteries: Secondary | ICD-10-CM

## 2023-07-26 DIAGNOSIS — E782 Mixed hyperlipidemia: Secondary | ICD-10-CM

## 2023-07-26 DIAGNOSIS — E079 Disorder of thyroid, unspecified: Secondary | ICD-10-CM | POA: Diagnosis not present

## 2023-07-26 LAB — CBC WITH DIFFERENTIAL/PLATELET
Basophils Absolute: 0.1 10*3/uL (ref 0.0–0.1)
Basophils Relative: 1 % (ref 0.0–3.0)
Eosinophils Absolute: 0.2 10*3/uL (ref 0.0–0.7)
Eosinophils Relative: 3.5 % (ref 0.0–5.0)
HCT: 40.7 % (ref 36.0–46.0)
Hemoglobin: 13.5 g/dL (ref 12.0–15.0)
Lymphocytes Relative: 25 % (ref 12.0–46.0)
Lymphs Abs: 1.6 10*3/uL (ref 0.7–4.0)
MCHC: 33.2 g/dL (ref 30.0–36.0)
MCV: 91.4 fL (ref 78.0–100.0)
Monocytes Absolute: 0.5 10*3/uL (ref 0.1–1.0)
Monocytes Relative: 7.1 % (ref 3.0–12.0)
Neutro Abs: 4.1 10*3/uL (ref 1.4–7.7)
Neutrophils Relative %: 63.4 % (ref 43.0–77.0)
Platelets: 245 10*3/uL (ref 150.0–400.0)
RBC: 4.46 Mil/uL (ref 3.87–5.11)
RDW: 13.6 % (ref 11.5–15.5)
WBC: 6.5 10*3/uL (ref 4.0–10.5)

## 2023-07-26 LAB — COMPREHENSIVE METABOLIC PANEL
ALT: 15 U/L (ref 0–35)
AST: 21 U/L (ref 0–37)
Albumin: 4 g/dL (ref 3.5–5.2)
Alkaline Phosphatase: 58 U/L (ref 39–117)
BUN: 26 mg/dL — ABNORMAL HIGH (ref 6–23)
CO2: 28 meq/L (ref 19–32)
Calcium: 9.4 mg/dL (ref 8.4–10.5)
Chloride: 108 meq/L (ref 96–112)
Creatinine, Ser: 1.3 mg/dL — ABNORMAL HIGH (ref 0.40–1.20)
GFR: 40.26 mL/min — ABNORMAL LOW (ref >60.00)
Glucose, Bld: 83 mg/dL (ref 70–99)
Potassium: 4.9 meq/L (ref 3.5–5.1)
Sodium: 142 meq/L (ref 135–145)
Total Bilirubin: 0.4 mg/dL (ref 0.2–1.2)
Total Protein: 6.3 g/dL (ref 6.0–8.3)

## 2023-07-26 LAB — LIPID PANEL
Cholesterol: 144 mg/dL (ref 0–200)
HDL: 48.6 mg/dL (ref >39.00)
LDL Cholesterol: 47 mg/dL (ref 0–99)
NonHDL: 95.1
Total CHOL/HDL Ratio: 3
Triglycerides: 240 mg/dL — ABNORMAL HIGH (ref 0.0–149.0)
VLDL: 48 mg/dL — ABNORMAL HIGH (ref 0.0–40.0)

## 2023-07-26 LAB — VITAMIN B12: Vitamin B-12: 357 pg/mL (ref 211–911)

## 2023-07-26 LAB — VITAMIN D 25 HYDROXY (VIT D DEFICIENCY, FRACTURES): VITD: 57.08 ng/mL (ref 30.00–100.00)

## 2023-07-26 LAB — TSH: TSH: 0.5 u[IU]/mL (ref 0.35–5.50)

## 2023-07-26 MED ORDER — ATORVASTATIN CALCIUM 10 MG PO TABS: 10.0000 mg | ORAL_TABLET | Freq: Every day | ORAL | 1 refills | Status: DC

## 2023-07-26 MED ORDER — FENOFIBRIC ACID 135 MG PO CPDR: 1.0000 | DELAYED_RELEASE_CAPSULE | Freq: Every day | ORAL | 1 refills | Status: DC

## 2023-07-26 MED ORDER — CLONAZEPAM 1 MG PO TABS: 1.0000 mg | ORAL_TABLET | Freq: Three times a day (TID) | ORAL | 0 refills | Status: DC | PRN

## 2023-07-26 MED ORDER — ALBUTEROL SULFATE HFA 108 (90 BASE) MCG/ACT IN AERS: 2.0000 | INHALATION_SPRAY | Freq: Four times a day (QID) | RESPIRATORY_TRACT | 3 refills | Status: DC | PRN

## 2023-07-26 NOTE — Assessment & Plan Note (Signed)
Elevated blood pressure in clinic today. Patient reports higher stress and recent dietary indiscretion with Christmas festivities. Not currently on any antihypertensive medications. -Check blood pressure at home daily for the next two weeks. -Return to clinic in two weeks for blood pressure recheck.

## 2023-07-26 NOTE — Telephone Encounter (Signed)
Copied from CRM [id number]. Topic: General - Other >> Jul 26, 2023  1:51 PM Taleah C wrote: Reason for CRM: Patient called and stated that she noticed on her AVS it stated that she is a smoker. However, patient asked for this to be corrected because she does not smoke. Please correct chart with updated information as she has not been a smoker in 27 years.

## 2023-07-26 NOTE — Assessment & Plan Note (Signed)
Following with cardiology Continue current meds and lifestyle measures Labs today

## 2023-07-26 NOTE — Assessment & Plan Note (Signed)
Stable. No SI/HI. Continue current regimen.

## 2023-07-26 NOTE — Assessment & Plan Note (Signed)
Previously stable. Labs today.

## 2023-07-26 NOTE — Progress Notes (Signed)
Established Patient Office Visit  Subjective   Patient ID: Emily Mitchell, female    DOB: [birth year]  Age: 77 y.o. MRN: [id number]  Chief Complaint  Patient presents with   Medical Management of Chronic Issues   Diabetes        Discussed the use of AI scribe software for clinical note transcription with the patient, who gave verbal consent to proceed.  History of Present Illness   The patient presents for a routine follow-up. She reports a recent increase in blood pressure, which she attributes to dietary indiscretions (Christmas/Thanksgiving) and stress. She denies any associated symptoms such as chest pain, shortness of breath, edema, or headaches.  The patient is currently managing her blood pressure through dietary measures, specifically the consumption of beets. She reports no new medications and is adherent to her current regimen, which includes Lipitor, baby aspirin, fenofibric acid, gabapentin, calcium, vitamin D, B12, Nexium, Pepcid, Zetia, Prozac, Klonopin, Synthroid, Singulair, fish oil supplement, probiotic, and primidone.  She expresses a desire to reduce her medication load and is considering discontinuing gabapentin, as she is unsure of its necessity. She reports previous nerve pain in her legs, which may have been the initial indication for the gabapentin.  The patient also reports occasional use of Klonopin, depending on her mood and the events of the day. She is considering the addition of a daily multivitamin and garlic supplement to her regimen.            ROS All review of systems negative except what is listed in the HPI    Objective:     BP (!) 159/56   Pulse 71   Ht 5\' 3"  (1.6 m)   Wt 180 lb (81.6 kg)   SpO2 99%   BMI 31.89 kg/m     Physical Exam Vitals reviewed.  Constitutional:      Appearance: Normal appearance. She is obese.  Cardiovascular:     Rate and Rhythm: Normal rate and regular rhythm.  Pulmonary:     Effort: Pulmonary effort  is normal.     Breath sounds: Normal breath sounds.  Musculoskeletal:     Right lower leg: No edema.     Left lower leg: No edema.  Skin:    General: Skin is warm and dry.  Neurological:     Mental Status: She is alert and oriented to person, place, and time.  Psychiatric:        Mood and Affect: Mood normal.        Behavior: Behavior normal.        Thought Content: Thought content normal.        Judgment: Judgment normal.      No results found for any visits on 07/26/23.     The ASCVD Risk score (Arnett DK, et al., 2019) failed to calculate for the following reasons:   Risk score cannot be calculated because patient has a medical history suggesting prior/existing ASCVD    Assessment & Plan:   Problem List Items Addressed This Visit       Active Problems   Anxiety and depression - Primary   Stable. No SI/HI. Continue current regimen.       Relevant Medications   clonazePAM (KLONOPIN) 1 MG tablet   Acid reflux   Well controlled on current regimen and lifestyle measures        CKD (chronic kidney disease), stage IV (HCC)   Labs today       Thyroid disease  Continue current synthroid and adjust pending labs       Relevant Orders   TSH   Congestive heart failure [facility])   Following with cardiology. No signs of fluid overload. Continue current regimen.       Relevant Medications   atorvastatin (LIPITOR) 10 MG tablet   Choline Fenofibrate (FENOFIBRIC ACID) 135 MG CPDR   Hyperlipidemia, mixed   Following with cardiology Continue current meds and lifestyle measures Labs today       Relevant Medications   atorvastatin (LIPITOR) 10 MG tablet   Choline Fenofibrate (FENOFIBRIC ACID) 135 MG CPDR   Other Relevant Orders   Comprehensive metabolic panel   Lipid panel   Carotid artery disease [facility])   Following with cardiology.  Asymptomatic.       Relevant Medications   atorvastatin (LIPITOR) 10 MG tablet   Choline Fenofibrate (FENOFIBRIC ACID) 135 MG CPDR    Essential hypertension   Elevated blood pressure in clinic today. Patient reports higher stress and recent dietary indiscretion with Christmas festivities. Not currently on any antihypertensive medications. -Check blood pressure at home daily for the next two weeks. -Return to clinic in two weeks for blood pressure recheck.      Relevant Medications   atorvastatin (LIPITOR) 10 MG tablet   Choline Fenofibrate (FENOFIBRIC ACID) 135 MG CPDR   Other Relevant Orders   CBC with Differential/Platelet   Comprehensive metabolic panel   Lipid panel   TSH   Hyperglycemia   Controlled. Last A1c 5.4%. Repeat labs today. Continue healthy lifestyle       COPD with asthma (HCC)   Stable. No acute concerns.       Relevant Medications   albuterol (VENTOLIN HFA) 108 (90 Base) MCG/ACT inhaler   Vitamin D deficiency   Previously stable. Labs today.       Relevant Orders   VITAMIN D 25 Hydroxy (Vit-D Deficiency, Fractures)   Other Visit Diagnoses       B12 deficiency       Relevant Orders   Vitamin B12       Return for routine follow-up 3-6 months; nurse visit BP check in 2 weeks .    Clayborne Dana, NP

## 2023-07-26 NOTE — Assessment & Plan Note (Signed)
Labs today

## 2023-07-26 NOTE — Telephone Encounter (Signed)
Called patient and let her know it is still showing that she is a former smoker since 12/03/1994

## 2023-07-26 NOTE — Assessment & Plan Note (Signed)
Stable. No acute concerns

## 2023-07-26 NOTE — Assessment & Plan Note (Signed)
Following with cardiology. No signs of fluid overload. Continue current regimen.

## 2023-07-26 NOTE — Assessment & Plan Note (Signed)
Following with cardiology. Asymptomatic.

## 2023-07-26 NOTE — Assessment & Plan Note (Signed)
Controlled. Last A1c 5.4%. Repeat labs today. Continue healthy lifestyle

## 2023-07-26 NOTE — Assessment & Plan Note (Signed)
Well controlled on current regimen and lifestyle measures

## 2023-07-26 NOTE — Assessment & Plan Note (Signed)
Continue current synthroid and adjust pending labs

## 2023-08-02 NOTE — Progress Notes (Signed)
 Assessment/Plan:    1.  Essential Tremor  -DaTscan  done September, 2024 was normal  -happy with primidone  50 mg bid.  We can increase in future if needed.    Subjective:   Emily Mitchell was seen today in follow up for essential tremor.  Son with patient who supplements hx.   My previous records were reviewed prior to todays visit. Pt had a DaTscan  done in September, 2024.  That was normal.  We did start her on primidone , 50 mg twice daily, but told her that we may need to go higher on that.  She reports today that its been great and very helpful!.  She was able to write a sentence and was so excited.  She initially had more falls but that is much better.  Last fall was early fall.    Current prescribed movement disorder medications: Primidone , 50 mg twice daily  Current/Previously tried tremor medications: On clonazepam  1 mg daily; gabapentin  100 mg twice daily; prior carvedilol  (not currently)   Current medications that may exacerbate tremor:  Albuterol  (she uses it daily and not sure if worsens); Singulair ; breztri   ALLERGIES:   Allergies  Allergen Reactions   Niaspan [Niacin Er (Antihyperlipidemic)] Nausea And Vomiting and Swelling    Swelling in mouth   Pantoprazole      Mouth sores   Rosuvastatin Calcium  Anaphylaxis   Sulfonamide Derivatives Hives   Bupropion  Other (See Comments)    Uncontrollable shakes   Nitroglycerin      NOT allergic - cardiology has recommended she NOT use this due to h/o severe carotid disease as it may decrease cerebral perfusion   Penicillins Hives    DID THE REACTION INVOLVE: Swelling of the face/tongue/throat, SOB, or low BP? Yes Sudden or severe rash/hives, skin peeling, or the inside of the mouth or nose? Unknown Did it require medical treatment? Unknown When did it last happen?   [age]  If all above answers are "NO", may proceed with cephalosporin use.   Statins    Apple Rash   Apple Juice Rash   Banana Rash    CURRENT MEDICATIONS:   Current Meds  Medication Sig   acetaminophen  (TYLENOL ) 500 MG tablet Take 500 mg by mouth as needed for headache.   albuterol  (VENTOLIN  HFA) 108 (90 Base) MCG/ACT inhaler Inhale 2 puffs into the lungs every 6 (six) hours as needed for wheezing or shortness of breath.   aspirin  EC 81 MG tablet Take 1 tablet (81 mg total) by mouth daily.   atorvastatin  (LIPITOR) 10 MG tablet Take 1 tablet (10 mg total) by mouth daily.   BREZTRI  AEROSPHERE 160-9-4.8 MCG/ACT AERO INHALE 2 PUFFS TWICE DAILY IN MORNING AND IN THE EVENING   Calcium  Citrate-Vitamin D  200-250 MG-UNIT TABS Take 1 tablet by mouth daily.   Cholecalciferol 25 MCG (1000 UT) tablet Take 1,000 Units by mouth daily.   Choline Fenofibrate  (FENOFIBRIC ACID ) 135 MG CPDR Take 1 tablet by mouth daily.   clonazePAM  (KLONOPIN ) 1 MG tablet Take 1 tablet (1 mg total) by mouth 3 (three) times daily as needed for anxiety.   cyanocobalamin  100 MCG tablet Take 500 mcg by mouth daily.   cyclobenzaprine  (FLEXERIL ) 10 MG tablet Take 1 tablet (10 mg total) by mouth at bedtime as needed.   esomeprazole  (NEXIUM ) 40 MG capsule TAKE 1 CAPSULE (40 MG TOTAL) BY MOUTH DAILY.   ezetimibe  (ZETIA ) 10 MG tablet TAKE 1 TABLET BY MOUTH EVERY DAY   famotidine  (PEPCID ) 40 MG tablet TAKE 1  TABLET BY MOUTH EVERY DAY   FLUoxetine  (PROZAC ) 20 MG tablet TAKE 1 TABLET BY MOUTH EVERY DAY   gabapentin  (NEURONTIN ) 100 MG capsule TAKE 2 CAPSULES BY MOUTH AT BEDTIME   levothyroxine  (SYNTHROID ) 100 MCG tablet TAKE 1 TABLET BY MOUTH EVERY DAY   montelukast  (SINGULAIR ) 10 MG tablet TAKE 1 TABLET BY MOUTH AT BEDTIME AS NEEDED.   NON FORMULARY Take 1 tablet by mouth daily. Digestive advantage.   Omega-3 Fatty Acids (FISH OIL PO) Take 1 capsule by mouth daily.   primidone  (MYSOLINE ) 50 MG tablet TAKE 1 TABLET BY MOUTH TWICE A DAY   Probiotic Product (VSL#3) CAPS Take 1 capsule by mouth daily.      Objective:    PHYSICAL EXAMINATION:    VITALS:   Vitals:   08/06/23 1358  BP:  126/74  Pulse: 71  SpO2: 96%  Weight: 181 lb 12.8 oz (82.5 kg)  Height: 5' 3 (1.6 m)    Gen:  Appears stated age and in NAD. HEENT:  Normocephalic, atraumatic. The mucous membranes are moist. The superficial temporal arteries are without ropiness or tenderness. Cardiovascular: Regular rate and rhythm. Lungs: Clear to auscultation bilaterally. Neck: There are no carotid bruits noted bilaterally.   NEUROLOGICAL:   Orientation:  The patient is alert and oriented x 3.   Cranial nerves: There is good facial symmetry. Extraocular muscles are intact and visual fields are full to confrontational testing. Speech is fluent and clear. Soft palate rises symmetrically and there is no tongue deviation. Hearing is intact to conversational tone. Tone: min increased tone in the RUE Sensation: Sensation is intact to light touch touch throughout  Coordination:  The patient has no dysdiadichokinesia or dysmetria. Motor: Strength is 5/5 in the bilateral upper and lower extremities.   Gait and Station: The patient is short stepped but she doesn't shuffle.     MOVEMENT EXAM: Tremor:    there is RUE rest tremor, rare.  There is postural tremor, R>L.  It is improved compared to prior. she has mild difficulty with archimedes spirals.  she doesn't spill water when pouring water from one glass, which is improved. Today:    03/2023:  03/2023:    I have reviewed and interpreted the following labs independently   Chemistry      Component Value Date/Time   NA 142 07/26/2023 1325   NA 140 10/31/2021 0000   K 4.9 07/26/2023 1325   CL 108 07/26/2023 1325   CO2 28 07/26/2023 1325   BUN 26 (H) 07/26/2023 1325   BUN 28 (A) 10/31/2021 0000   CREATININE 1.30 (H) 07/26/2023 1325   CREATININE 2.45 (H) 12/12/2013 1521   GLU 87 10/31/2021 0000      Component Value Date/Time   CALCIUM  9.4 07/26/2023 1325   CALCIUM  9.5 11/25/2009 1346   ALKPHOS 58 07/26/2023 1325   AST 21 07/26/2023 1325   ALT 15 07/26/2023  1325   BILITOT 0.4 07/26/2023 1325   BILITOT 0.4 04/12/2020 1137      Lab Results  Component Value Date   WBC 6.5 07/26/2023   HGB 13.5 07/26/2023   HCT 40.7 07/26/2023   MCV 91.4 07/26/2023   PLT 245.0 07/26/2023   Lab Results  Component Value Date   TSH 0.50 07/26/2023     Chemistry      Component Value Date/Time   NA 142 07/26/2023 1325   NA 140 10/31/2021 0000   K 4.9 07/26/2023 1325   CL 108 07/26/2023 1325  CO2 28 07/26/2023 1325   BUN 26 (H) 07/26/2023 1325   BUN 28 (A) 10/31/2021 0000   CREATININE 1.30 (H) 07/26/2023 1325   CREATININE 2.45 (H) 12/12/2013 1521   GLU 87 10/31/2021 0000      Component Value Date/Time   CALCIUM  9.4 07/26/2023 1325   CALCIUM  9.5 11/25/2009 1346   ALKPHOS 58 07/26/2023 1325   AST 21 07/26/2023 1325   ALT 15 07/26/2023 1325   BILITOT 0.4 07/26/2023 1325   BILITOT 0.4 04/12/2020 1137         Total time spent on today's visit was 30 minutes, including both face-to-face time and nonface-to-face time.  Time included that spent on review of records (prior notes available to me/labs/imaging if pertinent), discussing treatment and goals, answering patient's questions and coordinating care.  Cc:  Domenica Harlene LABOR, MD

## 2023-08-06 ENCOUNTER — Ambulatory Visit (INDEPENDENT_AMBULATORY_CARE_PROVIDER_SITE_OTHER): Payer: Medicare Other | Admitting: Neurology

## 2023-08-06 ENCOUNTER — Encounter: Payer: Self-pay | Admitting: Neurology

## 2023-08-06 VITALS — BP 126/74 | HR 71 | Ht 63.0 in | Wt 181.8 lb

## 2023-08-06 DIAGNOSIS — G250 Essential tremor: Secondary | ICD-10-CM

## 2023-08-10 ENCOUNTER — Ambulatory Visit: Payer: Medicare Other

## 2023-08-15 ENCOUNTER — Ambulatory Visit: Payer: Medicare Other

## 2023-08-15 VITALS — BP 132/80

## 2023-08-15 DIAGNOSIS — I10 Essential (primary) hypertension: Secondary | ICD-10-CM | POA: Diagnosis not present

## 2023-08-15 NOTE — Progress Notes (Signed)
 Pt here for Blood pressure check per Kerrie Peek  Pt currently takes: N/A   Pt reports no blood pressure medication currently  BP today @ = 132/80 HR = 70  Pt advised per Carolynne Citron to continue to monitor blood pressure at home and send records of blood pressures at for 2 weeks via Mychart

## 2023-09-12 DIAGNOSIS — N1832 Chronic kidney disease, stage 3b: Secondary | ICD-10-CM | POA: Diagnosis not present

## 2023-09-18 DIAGNOSIS — E785 Hyperlipidemia, unspecified: Secondary | ICD-10-CM | POA: Diagnosis not present

## 2023-09-18 DIAGNOSIS — D631 Anemia in chronic kidney disease: Secondary | ICD-10-CM | POA: Diagnosis not present

## 2023-09-18 DIAGNOSIS — N1832 Chronic kidney disease, stage 3b: Secondary | ICD-10-CM | POA: Diagnosis not present

## 2023-09-18 DIAGNOSIS — I6529 Occlusion and stenosis of unspecified carotid artery: Secondary | ICD-10-CM | POA: Diagnosis not present

## 2023-09-18 DIAGNOSIS — I129 Hypertensive chronic kidney disease with stage 1 through stage 4 chronic kidney disease, or unspecified chronic kidney disease: Secondary | ICD-10-CM | POA: Diagnosis not present

## 2023-09-18 DIAGNOSIS — R251 Tremor, unspecified: Secondary | ICD-10-CM | POA: Diagnosis not present

## 2023-09-18 DIAGNOSIS — I509 Heart failure, unspecified: Secondary | ICD-10-CM | POA: Diagnosis not present

## 2023-09-20 ENCOUNTER — Other Ambulatory Visit: Payer: Self-pay | Admitting: Family Medicine

## 2023-09-20 DIAGNOSIS — E785 Hyperlipidemia, unspecified: Secondary | ICD-10-CM

## 2023-09-23 ENCOUNTER — Other Ambulatory Visit: Payer: Self-pay | Admitting: Family Medicine

## 2023-09-23 DIAGNOSIS — F32A Depression, unspecified: Secondary | ICD-10-CM

## 2023-09-24 NOTE — Telephone Encounter (Signed)
 Requesting: clonazepam 1mg   Contract: 01/03/23 UDS: 01/03/23 Last Visit: 07/26/23 w/ Ladona Ridgel  Next Visit: 11/08/2023 Last Refill: 07/26/2023 #90 and 4UJ   Please Advise

## 2023-11-04 NOTE — Assessment & Plan Note (Signed)
 No recent exacerbation

## 2023-11-04 NOTE — Assessment & Plan Note (Signed)
 Tolerating statin, encouraged heart healthy diet, avoid trans fats, minimize simple carbs and saturated fats. Increase exercise as tolerated

## 2023-11-04 NOTE — Assessment & Plan Note (Signed)
 Well controlled, no changes to meds. Encouraged heart healthy diet such as the DASH diet and exercise as tolerated.

## 2023-11-04 NOTE — Assessment & Plan Note (Signed)
 On Levothyroxine, continue to monitor

## 2023-11-04 NOTE — Assessment & Plan Note (Signed)
 hgba1c acceptable, minimize simple carbs. Increase exercise as tolerated.

## 2023-11-04 NOTE — Assessment & Plan Note (Signed)
No recent flare. Follows with cardiology

## 2023-11-04 NOTE — Assessment & Plan Note (Signed)
 Does not tolerate statins.

## 2023-11-04 NOTE — Assessment & Plan Note (Signed)
 Supplement and monitor

## 2023-11-04 NOTE — Assessment & Plan Note (Signed)
 Hydrate and monitor

## 2023-11-08 ENCOUNTER — Ambulatory Visit: Payer: Medicare Other | Admitting: Family Medicine

## 2023-11-08 VITALS — BP 168/86 | HR 73 | Temp 97.7°F | Resp 18 | Ht 62.0 in | Wt 180.3 lb

## 2023-11-08 DIAGNOSIS — E782 Mixed hyperlipidemia: Secondary | ICD-10-CM | POA: Diagnosis not present

## 2023-11-08 DIAGNOSIS — R739 Hyperglycemia, unspecified: Secondary | ICD-10-CM | POA: Diagnosis not present

## 2023-11-08 DIAGNOSIS — M545 Low back pain, unspecified: Secondary | ICD-10-CM | POA: Diagnosis not present

## 2023-11-08 DIAGNOSIS — E559 Vitamin D deficiency, unspecified: Secondary | ICD-10-CM

## 2023-11-08 DIAGNOSIS — Z789 Other specified health status: Secondary | ICD-10-CM | POA: Diagnosis not present

## 2023-11-08 DIAGNOSIS — I509 Heart failure, unspecified: Secondary | ICD-10-CM | POA: Diagnosis not present

## 2023-11-08 DIAGNOSIS — M25561 Pain in right knee: Secondary | ICD-10-CM

## 2023-11-08 DIAGNOSIS — E079 Disorder of thyroid, unspecified: Secondary | ICD-10-CM

## 2023-11-08 DIAGNOSIS — I10 Essential (primary) hypertension: Secondary | ICD-10-CM

## 2023-11-08 DIAGNOSIS — J45909 Unspecified asthma, uncomplicated: Secondary | ICD-10-CM

## 2023-11-08 DIAGNOSIS — R413 Other amnesia: Secondary | ICD-10-CM

## 2023-11-08 DIAGNOSIS — N184 Chronic kidney disease, stage 4 (severe): Secondary | ICD-10-CM | POA: Diagnosis not present

## 2023-11-08 DIAGNOSIS — R300 Dysuria: Secondary | ICD-10-CM

## 2023-11-08 DIAGNOSIS — J441 Chronic obstructive pulmonary disease with (acute) exacerbation: Secondary | ICD-10-CM

## 2023-11-08 MED ORDER — METOPROLOL SUCCINATE ER 25 MG PO TB24
25.0000 mg | ORAL_TABLET | Freq: Every day | ORAL | 3 refills | Status: DC
Start: 2023-11-08 — End: 2024-02-04

## 2023-11-08 MED ORDER — METHYLPREDNISOLONE 4 MG PO TABS: ORAL_TABLET | ORAL | 0 refills | Status: DC

## 2023-11-08 MED ORDER — BUSPIRONE HCL 5 MG PO TABS
5.0000 mg | ORAL_TABLET | Freq: Three times a day (TID) | ORAL | 3 refills | Status: DC
Start: 2023-11-08 — End: 2023-11-30

## 2023-11-08 MED ORDER — DOXYCYCLINE HYCLATE 100 MG PO TABS: 100.0000 mg | ORAL_TABLET | Freq: Two times a day (BID) | ORAL | 0 refills | Status: DC

## 2023-11-08 NOTE — Patient Instructions (Addendum)
 [facility] has an adult day program                                                    Acute Knee Pain, Adult Many things can cause knee pain. Sometimes, knee pain is sudden (acute). It may be caused by damage, swelling, or irritation of the muscles and tissues that support your knee. Pain may come from: A fall. An injury to the knee from twisting motions. A hit to the knee. Infection. The pain often goes away on its own with time and rest. If the pain does not go away, tests may be done to find out what is causing the pain. These may include: Imaging tests, such as an X-ray, MRI, CT scan, or ultrasound. Joint aspiration. In this test, fluid is removed from the knee and checked. Arthroscopy. In this test, a lighted tube is put in the knee and an image is shown on a screen. A biopsy. In this test, a health care provider will remove a small piece of tissue for testing. Follow these instructions at home: If you have a knee sleeve or brace that can be taken off:  Wear the knee sleeve or brace as told by your provider. Take it off only if your provider says that you can. Check the skin around it every day. Tell your provider if you see problems. Loosen the knee sleeve or brace if your toes tingle, are numb, or turn cold and blue. Keep the knee sleeve or brace clean and dry. Bathing If the knee sleeve or brace is not waterproof: Do not let it get wet. Cover it when you take a bath or shower. Use a cover that does not let any water in. Managing pain, stiffness, and swelling  If told, put ice on the area. If you have a knee sleeve or brace that you can take off, remove it as told. Put ice in a plastic bag. Place a towel between your skin and the bag. Leave the ice on for 20 minutes, 2-3 times a day. If your skin turns bright red, take off the ice right away to prevent skin damage. The risk of damage is higher if you cannot feel pain, heat, or  cold. Move your toes often to reduce stiffness and swelling. Raise the injured area above the level of your heart while you are sitting or lying down. Use a pillow to support your foot as needed. If told, use an elastic bandage to put pressure (compression) on your injured knee. This may control swelling, give support, and help with discomfort. Sleep with a pillow under your knee. Activity Rest your knee. Do not do things that cause pain or make pain worse. Do not stand or walk on your injured knee until you're told it's okay. Use crutches as told. Avoid activities where both feet leave the ground at the same time and put stress on the joints. Avoid running, jumping rope, and doing jumping jacks. Work with a physical therapist to make a safe exercise program if told. Physical therapy helps your knee move better and get stronger. Exercise as told. General instructions Take your medicines only as told by your provider. If you are overweight, work with your provider and an expert in healthy eating, called a dietician, to set goals to lose weight. Being overweight can make  your knee hurt more. Do not smoke, vape, or use products with nicotine or tobacco in them. If you need help quitting, talk with your provider. Return to normal activities when you are told. Ask what things are safe for you to do. Watch for any changes in your symptoms. Keep all follow-up visits. Your provider will check your healing and adjust treatments if needed. Contact a health care provider if: The knee pain does not stop. The knee pain changes or gets worse. You have a fever along with knee pain. Your knee is red or feels warm when you touch it. Your knee gives out or locks up. Get help right away if: Your knee swells and the swelling gets worse. You cannot move your knee. You have very bad knee pain that does not get better with medicine. This information is not intended to replace advice given to you by your health  care provider. Make sure you discuss any questions you have with your health care provider. Document Revised: 04/19/2023 Document Reviewed: 09/11/2022 Elsevier Patient Education  2024 [employer].

## 2023-11-09 ENCOUNTER — Other Ambulatory Visit

## 2023-11-09 ENCOUNTER — Encounter: Payer: Self-pay | Admitting: Family Medicine

## 2023-11-09 DIAGNOSIS — R300 Dysuria: Secondary | ICD-10-CM

## 2023-11-09 DIAGNOSIS — M545 Low back pain, unspecified: Secondary | ICD-10-CM | POA: Diagnosis not present

## 2023-11-09 LAB — CBC WITH DIFFERENTIAL/PLATELET
Basophils Absolute: 0.1 10*3/uL (ref 0.0–0.1)
Basophils Relative: 0.8 % (ref 0.0–3.0)
Eosinophils Absolute: 0.1 10*3/uL (ref 0.0–0.7)
Eosinophils Relative: 1.7 % (ref 0.0–5.0)
HCT: 39.9 % (ref 36.0–46.0)
Hemoglobin: 13.1 g/dL (ref 12.0–15.0)
Lymphocytes Relative: 26.2 % (ref 12.0–46.0)
Lymphs Abs: 1.7 10*3/uL (ref 0.7–4.0)
MCHC: 32.9 g/dL (ref 30.0–36.0)
MCV: 92.9 fl (ref 78.0–100.0)
Monocytes Absolute: 0.5 10*3/uL (ref 0.1–1.0)
Monocytes Relative: 7.8 % (ref 3.0–12.0)
Neutro Abs: 4 10*3/uL (ref 1.4–7.7)
Neutrophils Relative %: 63.5 % (ref 43.0–77.0)
Platelets: 315 10*3/uL (ref 150.0–400.0)
RBC: 4.29 Mil/uL (ref 3.87–5.11)
RDW: 14.9 % (ref 11.5–15.5)
WBC: 6.3 10*3/uL (ref 4.0–10.5)

## 2023-11-09 LAB — COMPREHENSIVE METABOLIC PANEL WITH GFR
ALT: 11 U/L (ref 0–35)
AST: 16 U/L (ref 0–37)
Albumin: 4.2 g/dL (ref 3.5–5.2)
Alkaline Phosphatase: 63 U/L (ref 39–117)
BUN: 24 mg/dL — ABNORMAL HIGH (ref 6–23)
CO2: 29 meq/L (ref 19–32)
Calcium: 9.5 mg/dL (ref 8.4–10.5)
Chloride: 106 meq/L (ref 96–112)
Creatinine, Ser: 1.22 mg/dL — ABNORMAL HIGH (ref 0.40–1.20)
GFR: 43.36 mL/min — ABNORMAL LOW (ref >60.00)
Glucose, Bld: 84 mg/dL (ref 70–99)
Potassium: 4.3 meq/L (ref 3.5–5.1)
Sodium: 143 meq/L (ref 135–145)
Total Bilirubin: 0.4 mg/dL (ref 0.2–1.2)
Total Protein: 6.7 g/dL (ref 6.0–8.3)

## 2023-11-09 LAB — LIPID PANEL
Cholesterol: 140 mg/dL (ref 0–200)
HDL: 41.4 mg/dL (ref >39.00)
LDL Cholesterol: 49 mg/dL (ref 0–99)
NonHDL: 98.82
Total CHOL/HDL Ratio: 3
Triglycerides: 249 mg/dL — ABNORMAL HIGH (ref 0.0–149.0)
VLDL: 49.8 mg/dL — ABNORMAL HIGH (ref 0.0–40.0)

## 2023-11-09 LAB — HEMOGLOBIN A1C: Hgb A1c MFr Bld: 5.3 % (ref 4.6–6.5)

## 2023-11-09 LAB — VITAMIN D 25 HYDROXY (VIT D DEFICIENCY, FRACTURES): VITD: 68.13 ng/mL (ref 30.00–100.00)

## 2023-11-09 LAB — TSH: TSH: 0.22 u[IU]/mL — ABNORMAL LOW (ref 0.35–5.50)

## 2023-11-09 NOTE — Progress Notes (Signed)
 Subjective:    Patient ID: Emily Mitchell, female    DOB: 23-Feb-1946, 78 y.o.   MRN: [id number]  Chief Complaint  Patient presents with  . Follow-up    HPI Discussed the use of AI scribe software for clinical note transcription with the patient, who gave verbal consent to proceed.  History of Present Illness Emily Mitchell is a 78 year old female who presents with a persistent cough and coordination issues. She is accompanied by her son, who is concerned about her coordination issues and memory problems.  She has been experiencing a persistent cough for about two weeks, described as similar to flu symptoms and productive of dark brown sputum. The cough occurs both during the day and at night, sometimes waking her up. There is chest pain associated with coughing, described as a pressure occurring both at rest and when upset, lasting a couple of minutes without associated sweating or nausea. No new shortness of breath or wheezing is noted. She denies vomiting or diarrhea but mentions occasional fevers, chills, and body aches. She is not currently using Breztri due to mouth sores and has noticed improved breathing since discontinuing it. Her son has a similar cough and tested negative for COVID. No shortness of breath at rest or with activity, and no racing heartbeats.  She experiences coordination issues, often related to distraction or sudden movements, leading to falls. Lightheadedness occurs when standing up quickly. Memory issues, emotional control problems, and increased anxiety are present. Her son is concerned about her safety and prefers she not leave the house alone. She is taking fluoxetine 20 mg for mood, which she feels helps somewhat.  She has been experiencing high blood pressure readings at home, though recent checks with a different cuff show lower readings. She is concerned about her blood pressure management. She is currently taking clonazepam 1.5 mg daily for anxiety, but not  consistently three times a day.  She has a history of knee replacements, with the right knee having been replaced twice. Occasionally, the right knee 'twists out of place' while walking, causing pain that she manages by stopping and rubbing the knee. She uses aspirin cream for relief.    Past Medical History:  Diagnosis Date  . Acid reflux   . Allergic rhinitis 05/19/2022  . Anemia 07/05/2014  . Anxiety and depression 12/14/2008   Qualifier: Diagnosis of  By: Hart Rochester LPN, Raynelle Fanning    . Arthritis    "fingers, toes, knees, joints" (07/18/2018)  . Asthma   . Atherosclerotic heart disease of native coronary artery without angina pectoris 07/19/2018  . Atypical chest pain 05/23/2015  . Back pain 02/02/2019  . Breast mass, left 04/28/2019  . CAD in native artery 07/19/2018  . Candidal skin infection 02/28/2018  . Carotid artery disease (HCC) 06/16/2015   S/p CEA   . CHF (congestive heart failure) (HCC)   . Chicken pox as a child  . Chronic obstructive pulmonary disease (HCC) 12/14/2008   Formatting of this note might be different from the original. Overview:  Spirometry 05/12/2013: FEV1 72% predicted FEV1 FVC ratio 73 predicted FEF 25 75  43% predicted  Last Assessment & Plan:  OK to stop taking advair Use albuterol as needed only Check oxygen levels during sleep - we will call you with results Call if symptoms worse  . Chronic rhinitis 06/14/2018  . CKD (chronic kidney disease), stage IV [facility])    Follows with [facility], Dr Oley Balm   . Congestive heart failure (  HCC) 11/30/2015   Formatting of this note might be different from the original. Last Assessment & Plan:  No recent exacerbation  . Constipation 12/17/2008   Qualifier: Diagnosis of  By: De Blanch.   . COPD with asthma (HCC) 11/06/2017  . Dry eyes 11/30/2015  . Essential hypertension 10/18/2015   Formatting of this note might be different from the original. Last Assessment & Plan:  poorlycontrolled and  under a great deal of stress, add Metoprolol XL 25 mg daily. Encouraged heart healthy diet such as the DASH diet and exercise as tolerated.  . Essential tremor 11/05/2013  . Exudative age-related macular degeneration of right eye with active choroidal neovascularization (HCC) 08/26/2015  . Gastro-esophageal reflux disease without esophagitis 12/14/2008   Formatting of this note might be different from the original. Formatting of this note might be different from the original. Formatting of this note might be different from the original. Overview:  Qualifier: Diagnosis of  By: Hart Rochester LPN, Raynelle Fanning   Last Assessment & Plan:  Avoid offending foods, start probiotics. Do not eat large meals in late evening and consider raising head of bed. Last Assessment  . GERD (gastroesophageal reflux disease)   . H. pylori infection 10/19/2013  . H/O multiple allergies 07/14/2018  . Helicobacter pylori (H. pylori) as the cause of diseases classified elsewhere 10/19/2013   Formatting of this note might be different from the original. Last Assessment & Plan:  Tetracycline, Pepto Bismol, Flagyl and Omeprazole.  . Hepatitis 1970s   "don't know for sure which one it was; think it was A" (07/18/2018)  . History of food allergy 08/14/2018  . History of pulmonary embolism 07/17/2018  . Hoarseness 03/09/2019  . Hypercalcemia 03/09/2019  . Hyperglycemia 03/14/2016  . Hyperlipidemia, mixed 05/26/2013   Crestor caused N/V Did not tolerate Lipitor   . Knee pain, bilateral 12/17/2012  . Left hip pain 05/23/2015  . Low back pain 09/19/2015  . Lower urinary tract infectious disease 10/19/2013  . Lymphadenopathy 10/18/2017  . Macular degeneration of both eyes 12/16/2015   Right eye is wet Left Eye is dry Shots every 11 to 12 weeks in right eye at opthamologist office  . Medicare annual wellness visit, subsequent 02/21/2015  . Mixed anxiety depressive disorder 12/14/2008   Formatting of this note might be different from the  original. Overview:  Qualifier: Diagnosis of  By: Hart Rochester LPN, Raynelle Fanning  Patient had increased  Tremors on Bupropion  Last Assessment & Plan:  Is struggling with the stress of her 70 year old granddaughter whom she raised moving back with her own mother, the patient is estranged from her daughter so she is very upset. Increase Citalopram 40 mg daily   . Mumps 78 yrs old  . Neck pain 04/22/2017  . Numbness of finger 12/17/2012  . Occlusion and stenosis of carotid artery without mention of cerebral infarction 12/17/2012  . OSA (obstructive sleep apnea) 12/14/2008   PSG  06/2013-AHI 6/h 08/2011 (bethany) AHI 4/h No longer needs CPAP   . Preventative health care 03/14/2016  . Pulmonary emboli (HCC) 05/26/2013  . Recurrent infection of skin 07/19/2021  . Rheumatoid factor positive 09/27/2021  . Right shoulder pain 10/18/2017  . Sensation of foreign body in larynx 04/08/2019  . Skin lesion of back 03/27/2021  . Sleep apnea    "don't use CPAP anymore; dr said I didn't have to" (07/18/2018)  . Static tremor 11/05/2013   Formatting of this note might be different from the original. Last  Assessment & Plan:  Follows with [facility]  . Statin intolerance 09/29/2019  . Stroke [facility])    "been told I've had some strokes; didn't know I'd had them" (07/18/2018)  . Syncope 06/11/2023  . Thyroid disease   . Vitamin D deficiency 03/03/2018  . Vitreomacular adhesion of both eyes 01/07/2019   Formatting of this note might be different from the original. no traction    Past Surgical History:  Procedure Laterality Date  . APPENDECTOMY  1984  . CARDIAC CATHETERIZATION  ~ 2006  . CAROTID ANGIOGRAPHY Left 08/20/2018   Procedure: CAROTID ANGIOGRAPHY;  Surgeon: Nada Libman, MD;  Location: [facility] INVASIVE CV LAB;  Service: Cardiovascular;  Laterality: Left;  . CAROTID ENDARTERECTOMY Right 05/10/2007   cea  . CATARACT EXTRACTION Left   . CATARACT EXTRACTION W/ INTRAOCULAR LENS  IMPLANT, BILATERAL Bilateral   .  COLONOSCOPY  09/25/2008   normal  . COLONOSCOPY WITH PROPOFOL  02/13/2022   [facility] B[facility] at [facility]  . CORONARY ANGIOPLASTY WITH STENT PLACEMENT  07/18/2018  . CORONARY PRESSURE/FFR STUDY N/A 07/18/2018   Procedure: INTRAVASCULAR PRESSURE WIRE/FFR STUDY;  Surgeon: Runell Gess, MD;  Location: [facility] INVASIVE CV LAB;  Service: Cardiovascular;  Laterality: N/A;  . CORONARY STENT INTERVENTION N/A 07/18/2018   Procedure: CORONARY STENT INTERVENTION;  Surgeon: Runell Gess, MD;  Location: [facility]VAS[facility] CV [facility];  Service: Cardiovascular;  Laterality: N/A;  . JOINT REPLACEMENT    . LEFT HEART CATH AND CORONARY ANGIOGRAPHY N/A 07/18/2018   Procedure: LEFT HEART CATH AND CORONARY ANGIOGRAPHY;  Surgeon: Runell Gess, MD;  Location: [facility];  Service: Cardiovascular;  Laterality: N/A;  . MULTIPLE TOOTH EXTRACTIONS    . TOTAL KNEE ARTHROPLASTY Bilateral 2005 - 2011   right - left  . VAGINAL HYSTERECTOMY  1984  . WISDOM TOOTH EXTRACTION      Family History  Problem Relation Age of Onset  . Stroke Mother        mini stroke  . Kidney disease Mother   . Heart failure Mother   . Hypertension Mother   . Diabetes Sister        type 2  . Kidney disease Sister   . Allergic rhinitis Sister   . Osteoarthritis Sister   . Hyperlipidemia Sister   . Heart attack Maternal Grandfather   . Colon cancer Neg Hx   . Stomach cancer Neg Hx   . Rectal cancer Neg Hx     Social History   Socioeconomic History  . Marital status: Divorced    Spouse name: Not on file  . Number of children: 2  . Years of education: Not on file  . Highest education level: 8th grade  Occupational History  . Not on file  Tobacco Use  . Smoking status: Former    Current packs/day: 0.00    Average packs/day: 2.0 packs/day for 30.0 years (60.0 ttl pk-yrs)    Types: Cigarettes    Start date: 12/02/1964    Quit date: 12/03/1994    Years since quitting: 28.9  . Smokeless tobacco: Never  . Tobacco comments:    Quit  smoking 26 years ago  Vaping Use  . Vaping status: Never Used  Substance and Sexual Activity  . Alcohol use: Yes    Comment: special occasions  . Drug use: Never  . Sexual activity: Not Currently    Birth control/protection: Post-menopausal    Comment: lives with son and friend, no dietary restrictions  Other Topics Concern  .  Not on file  Social History Narrative   Right handed   No caffeine   One story home   Social Drivers of Health   Financial Resource Strain: Low Risk  (07/24/2023)   Overall Financial Resource Strain (CARDIA)   . Difficulty of Paying Living Expenses: Not very hard  Food Insecurity: No Food Insecurity (07/24/2023)   Hunger Vital Sign   . Worried About Programme researcher, broadcasting/film/video in the Last Year: Never true   . Ran Out of Food in the Last Year: Never true  Transportation Needs: Unmet Transportation Needs (07/24/2023)   PRAPARE - Transportation   . Lack of Transportation (Medical): Yes   . Lack of Transportation (Non-Medical): No  Physical Activity: Sufficiently Active (07/24/2023)   Exercise Vital Sign   . Days of Exercise per Week: 3 days   . Minutes of Exercise per Session: 60 min  Recent Concern: Physical Activity - Inactive (05/02/2023)   Exercise Vital Sign   . Days of Exercise per Week: 0 days   . Minutes of Exercise per Session: 0 min  Stress: Stress Concern Present (07/24/2023)   [employer] of Occupational Health - Occupational Stress Questionnaire   . Feeling of Stress : To some extent  Social Connections: Socially Isolated (07/24/2023)   Social Connection and Isolation Panel [NHANES]   . Frequency of Communication with Friends and Family: More than three times a week   . Frequency of Social Gatherings with Friends and Family: Once a week   . Attends Religious Services: Never   . Active Member of Clubs or Organizations: No   . Attends Banker Meetings: Never   . Marital Status: Divorced  Catering manager Violence: Not At Risk  (05/02/2023)   Humiliation, Afraid, Rape, and Kick questionnaire   . Fear of Current or Ex-Partner: No   . Emotionally Abused: No   . Physically Abused: No   . Sexually Abused: No    Outpatient Medications Prior to Visit  Medication Sig Dispense Refill  . acetaminophen (TYLENOL) 500 MG tablet Take 500 mg by mouth as needed for headache.    . albuterol (VENTOLIN HFA) 108 (90 Base) MCG/ACT inhaler Inhale 2 puffs into the lungs every 6 (six) hours as needed for wheezing or shortness of breath. 18 g 3  . aspirin EC 81 MG tablet Take 1 tablet (81 mg total) by mouth daily. 90 tablet 3  . atorvastatin (LIPITOR) 10 MG tablet Take 1 tablet (10 mg total) by mouth daily. 90 tablet 1  . BREZTRI AEROSPHERE 160-9-4.8 MCG/ACT AERO INHALE 2 PUFFS TWICE DAILY IN MORNING AND IN THE EVENING 10.7 each 4  . Calcium Citrate-Vitamin D 200-250 MG-UNIT TABS Take 1 tablet by mouth daily.    . Cholecalciferol 25 MCG (1000 UT) tablet Take 1,000 Units by mouth daily.    . Choline Fenofibrate (FENOFIBRIC ACID) 135 MG CPDR Take 1 tablet by mouth daily. 90 capsule 1  . clonazePAM (KLONOPIN) 1 MG tablet TAKE 1 TABLET BY MOUTH 3 TIMES DAILY AS NEEDED FOR ANXIETY. 90 tablet 0  . cyanocobalamin 100 MCG tablet Take 500 mcg by mouth daily.    . cyclobenzaprine (FLEXERIL) 10 MG tablet Take 1 tablet (10 mg total) by mouth at bedtime as needed. 30 tablet 1  . esomeprazole (NEXIUM) 40 MG capsule TAKE 1 CAPSULE (40 MG TOTAL) BY MOUTH DAILY. 90 capsule 1  . ezetimibe (ZETIA) 10 MG tablet TAKE 1 TABLET BY MOUTH EVERY DAY 90 tablet 1  . FLUoxetine (  PROZAC) 20 MG tablet TAKE 1 TABLET BY MOUTH EVERY DAY 90 tablet 1  . gabapentin (NEURONTIN) 100 MG capsule TAKE 2 CAPSULES BY MOUTH AT BEDTIME 180 capsule 1  . levothyroxine (SYNTHROID) 100 MCG tablet TAKE 1 TABLET BY MOUTH EVERY DAY 90 tablet 2  . montelukast (SINGULAIR) 10 MG tablet TAKE 1 TABLET BY MOUTH AT BEDTIME AS NEEDED. 90 tablet 1  . NON FORMULARY Take 1 tablet by mouth daily.  Digestive advantage.    . Omega-3 Fatty Acids (FISH OIL PO) Take 1 capsule by mouth daily.    . primidone (MYSOLINE) 50 MG tablet TAKE 1 TABLET BY MOUTH TWICE A DAY 180 tablet 0  . Probiotic Product (VSL#3) CAPS Take 1 capsule by mouth daily.    . famotidine (PEPCID) 40 MG tablet TAKE 1 TABLET BY MOUTH EVERY DAY 90 tablet 1   No facility-administered medications prior to visit.    Allergies  Allergen Reactions  . Niaspan [Niacin Er (Antihyperlipidemic)] Nausea And Vomiting and Swelling    Swelling in mouth  . Pantoprazole     Mouth sores  . Rosuvastatin Calcium Anaphylaxis  . Sulfonamide Derivatives Hives  . Bupropion Other (See Comments)    Uncontrollable shakes  . Nitroglycerin     NOT allergic - cardiology has recommended she NOT use this due to h/o severe carotid disease as it may decrease cerebral perfusion  . Penicillins Hives    DID THE REACTION INVOLVE: Swelling of the face/tongue/throat, SOB, or low BP? Yes Sudden or severe rash/hives, skin peeling, or the inside of the mouth or nose? Unknown Did it require medical treatment? Unknown When did it last happen?   [age]  If all above answers are "NO", may proceed with cephalosporin use.  . Statins   . Apple Rash  . Apple Juice Rash  . Banana Rash    Review of Systems  Constitutional:  Negative for fever and malaise/fatigue.  HENT:  Positive for congestion.   Eyes:  Negative for blurred vision.  Respiratory:  Positive for cough and sputum production. Negative for shortness of breath.   Cardiovascular:  Positive for chest pain. Negative for palpitations and leg swelling.  Gastrointestinal:  Negative for abdominal pain, blood in stool and nausea.  Genitourinary:  Positive for dysuria, frequency and urgency.  Musculoskeletal:  Positive for falls and joint pain.  Skin:  Negative for rash.  Neurological:  Negative for dizziness, loss of consciousness and headaches.  Endo/Heme/Allergies:  Negative for environmental allergies.   Psychiatric/Behavioral:  Positive for depression and memory loss. The patient is nervous/anxious.        Objective:    Physical Exam Constitutional:      General: She is not in acute distress.    Appearance: Normal appearance. She is well-developed. She is not toxic-appearing.  HENT:     Head: Normocephalic and atraumatic.     Right Ear: External ear normal.     Left Ear: External ear normal.     Nose: Nose normal.  Eyes:     General:        Right eye: No discharge.        Left eye: No discharge.     Conjunctiva/sclera: Conjunctivae normal.  Neck:     Thyroid: No thyromegaly.  Cardiovascular:     Rate and Rhythm: Normal rate and regular rhythm.     Heart sounds: Normal heart sounds. No murmur heard. Pulmonary:     Effort: Pulmonary effort is normal. No respiratory distress.  Breath sounds: Wheezing present.  Abdominal:     General: Bowel sounds are normal.     Palpations: Abdomen is soft.     Tenderness: There is no abdominal tenderness. There is no guarding.  Musculoskeletal:        General: Normal range of motion.     Cervical back: Neck supple.  Lymphadenopathy:     Cervical: No cervical adenopathy.  Skin:    General: Skin is warm and dry.  Neurological:     Mental Status: She is alert and oriented to person, place, and time.  Psychiatric:        Mood and Affect: Mood normal.        Behavior: Behavior normal.        Thought Content: Thought content normal.        Judgment: Judgment normal.    BP (!) 168/86 (BP Location: Right Arm, Patient Position: Sitting, Cuff Size: Normal)   Pulse 73   Temp 97.7 F (36.5 C) (Oral)   Resp 18   Ht 5\' 2"  (1.575 m)   Wt 180 lb 4.8 oz (81.8 kg)   SpO2 98%   BMI 32.98 kg/m  Wt Readings from Last 3 Encounters:  11/08/23 180 lb 4.8 oz (81.8 kg)  08/06/23 181 lb 12.8 oz (82.5 kg)  07/26/23 180 lb (81.6 kg)    Diabetic Foot Exam - Simple   No data filed    Lab Results  Component Value Date   WBC 6.3 11/08/2023    HGB 13.1 11/08/2023   HCT 39.9 11/08/2023   PLT 315.0 11/08/2023   GLUCOSE 84 11/08/2023   CHOL 140 11/08/2023   TRIG 249.0 (H) 11/08/2023   HDL 41.40 11/08/2023   LDLDIRECT 103.0 07/14/2019   LDLCALC 49 11/08/2023   ALT 11 11/08/2023   AST 16 11/08/2023   NA 143 11/08/2023   K 4.3 11/08/2023   CL 106 11/08/2023   CREATININE 1.22 (H) 11/08/2023   BUN 24 (H) 11/08/2023   CO2 29 11/08/2023   TSH 0.22 (L) 11/08/2023   INR 1.79 07/18/2018   HGBA1C 5.3 11/08/2023    Lab Results  Component Value Date   TSH 0.22 (L) 11/08/2023   Lab Results  Component Value Date   WBC 6.3 11/08/2023   HGB 13.1 11/08/2023   HCT 39.9 11/08/2023   MCV 92.9 11/08/2023   PLT 315.0 11/08/2023   Lab Results  Component Value Date   NA 143 11/08/2023   K 4.3 11/08/2023   CO2 29 11/08/2023   GLUCOSE 84 11/08/2023   BUN 24 (H) 11/08/2023   CREATININE 1.22 (H) 11/08/2023   BILITOT 0.4 11/08/2023   ALKPHOS 63 11/08/2023   AST 16 11/08/2023   ALT 11 11/08/2023   PROT 6.7 11/08/2023   ALBUMIN 4.2 11/08/2023   CALCIUM 9.5 11/08/2023   ANIONGAP 13 02/15/2021   EGFR 40.0 05/16/2023   GFR 43.36 (L) 11/08/2023   Lab Results  Component Value Date   CHOL 140 11/08/2023   Lab Results  Component Value Date   HDL 41.40 11/08/2023   Lab Results  Component Value Date   LDLCALC 49 11/08/2023   Lab Results  Component Value Date   TRIG 249.0 (H) 11/08/2023   Lab Results  Component Value Date   CHOLHDL 3 11/08/2023   Lab Results  Component Value Date   HGBA1C 5.3 11/08/2023       Assessment & Plan:  Uncomplicated asthma, unspecified asthma severity, unspecified whether persistent Assessment &  Plan: No recent exacerbation   Congestive heart failure, unspecified HF chronicity, unspecified heart failure type (HCC) Assessment & Plan: No recent exacerbation   CKD (chronic kidney disease), stage IV (HCC) Assessment & Plan: Hydrate and monitor.     Chronic obstructive pulmonary  disease with acute exacerbation (HCC) Assessment & Plan: No recent exacerbation   Essential hypertension Assessment & Plan: Well controlled, no changes to meds. Encouraged heart healthy diet such as the DASH diet and exercise as tolerated.   Orders: -     TSH  Hyperglycemia Assessment & Plan: hgba1c acceptable, minimize simple carbs. Increase exercise as tolerated.   Orders: -     Comprehensive metabolic panel with GFR -     Hemoglobin A1c  Hyperlipidemia, mixed Assessment & Plan: Tolerating statin, encouraged heart healthy diet, avoid trans fats, minimize simple carbs and saturated fats. Increase exercise as tolerated  Orders: -     Lipid panel  Statin intolerance Assessment & Plan: Does not tolerate statins   Vitamin D deficiency Assessment & Plan: Supplement and monitor  Orders: -     VITAMIN D 25 Hydroxy (Vit-D Deficiency, Fractures)  Thyroid disease Assessment & Plan: On Levothyroxine, continue to monitor   Memory loss -     Ambulatory referral to Neurology  Low back pain, unspecified back pain laterality, unspecified chronicity, unspecified whether sciatica present -     Urinalysis, Routine w reflex microscopic; Future -     Urine Culture; Future  Right knee pain, unspecified chronicity -     CBC with Differential/Platelet  Dysuria -     Urinalysis  Other orders -     busPIRone HCl; Take 1 tablet (5 mg total) by mouth 3 (three) times daily.  Dispense: 90 tablet; Refill: 3 -     methylPREDNISolone; 6 tabs po x 1 1 day then 5 tabs po x 1 day then 4 tabs po x 1 day then 3 tabs po x 1 day then 2 tabs po x 1 day then 1 tab po x 1 day and stop  Dispense: 21 tablet; Refill: 0 -     Doxycycline Hyclate; Take 1 tablet (100 mg total) by mouth 2 (two) times daily.  Dispense: 20 tablet; Refill: 0 -     Metoprolol Succinate ER; Take 1 tablet (25 mg total) by mouth daily.  Dispense: 30 tablet; Refill: 3    Assessment and Plan Assessment & Plan Cough and  Wheezing Persistent cough with dark brown sputum, likely respiratory infection or asthma exacerbation. Wheezing due to bronchial constriction. Discontinued Breztri due to oral sores. Pulmonologist appointment on July 22. - Prescribe doxycycline for potential bacterial infection. - Prescribe methylprednisolone taper for inflammation and bronchial constriction. - Advise contacting pulmonologist if symptoms do not improve before appointment.  Chest Pain Intermittent chest pain, possibly musculoskeletal or cardiac. Clonazepam as needed to assess symptom relief. - Advise taking clonazepam as needed for chest pressure. - Consider cardiology referral if pain persists or worsens.  Memory and Coordination Issues Memory issues, emotional control problems, coordination issues, and essential tremor. Concern for cognitive decline, possibly related to depression or neurological conditions. Memory testing discussed. - Refer to Dr. Arbutus Leas for memory testing and neurological evaluation. - Consider starting Buspar 5 mg twice daily, increasing to three times daily if needed. - Discuss potential benefits of physical therapy for strength and balance.  Hypertension Blood pressure around 140s/80s. On multiple medications including clonazepam. Discussed starting metoprolol for blood pressure and anxiety management. - Start metoprolol 25 mg  for blood pressure and anxiety. - Schedule nurse visit in one month for blood pressure check. - Plan virtual follow-up in three months to assess management.  Knee Pain Right knee pain with twisting sensation, possibly related to previous knee replacements. Uses topical aspirin cream. - Encourage use of bicycle pedals for knee movement and strengthening. - Consider physical therapy if symptoms persist or worsen.  General Health Maintenance Discussed benefits of social activities and adult day programs for mental health and social engagement. - Provide information on [facility]  adult day program for social engagement and activities.     Danise Edge, MD

## 2023-11-10 LAB — URINE CULTURE
MICRO NUMBER:: 16319499
Result:: NO GROWTH
SPECIMEN QUALITY:: ADEQUATE

## 2023-11-10 LAB — URINALYSIS
Bilirubin Urine: NEGATIVE
Glucose, UA: NEGATIVE
Hgb urine dipstick: NEGATIVE
Ketones, ur: NEGATIVE
Leukocytes,Ua: NEGATIVE
Nitrite: NEGATIVE
Protein, ur: NEGATIVE
Specific Gravity, Urine: 1.02 (ref 1.001–1.035)
pH: 5.5 (ref 5.0–8.0)

## 2023-11-11 ENCOUNTER — Encounter: Payer: Self-pay | Admitting: Family Medicine

## 2023-11-12 ENCOUNTER — Telehealth: Payer: Self-pay | Admitting: *Deleted

## 2023-11-12 ENCOUNTER — Encounter: Payer: Self-pay | Admitting: Neurology

## 2023-11-12 DIAGNOSIS — N1832 Chronic kidney disease, stage 3b: Secondary | ICD-10-CM | POA: Diagnosis not present

## 2023-11-12 NOTE — Telephone Encounter (Signed)
 Patient scheduled to see Tammy on 02/19/24.  Nothing further needed.

## 2023-11-12 NOTE — Telephone Encounter (Signed)
 Copied from CRM [id number]. Topic: Appointments - Scheduling Inquiry for Clinic >> Nov 01, 2023  2:52 PM [name] wrote: Reason for CRM: Patient states Emily Clark, NP advised her next visit needs to be with [facility] - Patient states she has no way to make it to [facility] & she is not sure what she should do.

## 2023-11-13 ENCOUNTER — Other Ambulatory Visit: Payer: Self-pay | Admitting: Emergency Medicine

## 2023-11-13 DIAGNOSIS — E079 Disorder of thyroid, unspecified: Secondary | ICD-10-CM

## 2023-11-13 DIAGNOSIS — I10 Essential (primary) hypertension: Secondary | ICD-10-CM

## 2023-11-23 ENCOUNTER — Telehealth: Payer: Self-pay | Admitting: Family Medicine

## 2023-11-23 NOTE — Telephone Encounter (Signed)
 Copied from CRM [id number]. Topic: General - Other >> Nov 23, 2023  1:37 PM Freya Jesus wrote: Reason for CRM: Patient son Zoila Hines called regarding patient last visit. Stated that his mother saw Daril Edge and his friend who was there with her advised that she is no longer allowed to drive per provider request. Zoila Hines said his mother disagrees with that statement so he is requesting a call back from provider or nurse. Also requesting it's added to MyChart.  Call back: [phone]

## 2023-11-26 ENCOUNTER — Telehealth: Payer: Self-pay | Admitting: Family Medicine

## 2023-11-26 NOTE — Telephone Encounter (Signed)
 Copied from CRM [id number]. Topic: General - Other >> Nov 23, 2023  1:37 PM Freya Jesus wrote: Reason for CRM: Patient son Emily Mitchell called regarding patient last visit. Stated that his mother saw Daril Edge and his friend who was there with her advised that she is no longer allowed to drive per provider request. Emily Mitchell said his mother disagrees with that statement so he is requesting a call back from provider or nurse. Also requesting it's added to MyChart.  Call back: [phone] >> Nov 23, 2023  3:55 PM Varney Gentleman wrote: Patients son Emily Mitchell called stating if he can just get a phone call regarding his mom driving and not document anything in MyChart, states it will totally destroy her please only call him. [phone]

## 2023-11-26 NOTE — Telephone Encounter (Signed)
 Copied from CRM [id number]. Topic: General - Other >> Nov 26, 2023  4:37 PM Star East wrote: Reason for CRM: Starlyn Economy, patient's son calling about patient driving- please [phone]

## 2023-11-28 ENCOUNTER — Other Ambulatory Visit: Payer: Self-pay | Admitting: Neurology

## 2023-11-28 DIAGNOSIS — G250 Essential tremor: Secondary | ICD-10-CM

## 2023-11-28 NOTE — Telephone Encounter (Signed)
 Pt's son called and spoke with an agent from [email or web address]. I advised them of pcp's message and stated if they have any more questions to please schedule a virtual visit.

## 2023-11-29 ENCOUNTER — Telehealth: Payer: Self-pay

## 2023-11-29 ENCOUNTER — Telehealth: Payer: Self-pay | Admitting: Neurology

## 2023-11-29 ENCOUNTER — Other Ambulatory Visit: Payer: Self-pay | Admitting: Family

## 2023-11-29 ENCOUNTER — Encounter: Payer: Self-pay | Admitting: Family Medicine

## 2023-11-29 DIAGNOSIS — F419 Anxiety disorder, unspecified: Secondary | ICD-10-CM

## 2023-11-29 NOTE — Telephone Encounter (Signed)
 Information requested by patient's son about driving and whether she can drive or not.  Please advise if this was discussed on ov 11/08/23. Information not seen on ov note.

## 2023-11-29 NOTE — Telephone Encounter (Signed)
 Message was already responded too via another message.

## 2023-11-29 NOTE — Telephone Encounter (Signed)
 Copied from CRM [id number]. Topic: General - Other >> Nov 26, 2023  4:37 PM Star East wrote: Reason for CRM: Starlyn Economy, patient's son calling about patient driving- please [phone] >> Nov 28, 2023  4:42 PM Howard Macho wrote: Patient son called stating he would like to discuss what was said at the 4/10 appointment regarding the patient. Patient son want clarification on the driving issue. Patient son can only do appointments with the patient on Fridays after 4pm because he works in [place] and he has a new job and does not have time off yet. Patient son just need a quick phone call about whether the patient can drive or not CB [phone]

## 2023-11-30 ENCOUNTER — Telehealth: Payer: Self-pay

## 2023-11-30 ENCOUNTER — Other Ambulatory Visit: Payer: Self-pay | Admitting: Family Medicine

## 2023-11-30 NOTE — Telephone Encounter (Signed)
 Copied from CRM [id number]. Topic: Clinical - Medication Refill >> Nov 30, 2023  4:18 PM Dewanda Foots wrote: Most Recent Primary Care Visit:  Provider: [facility]  Department: [facility]  Visit Type: LAB VISIT  Date: 11/09/2023  Medication: clonazePAM  (KLONOPIN ) 1 MG tablet  Has the patient contacted their pharmacy? Yes (Agent: If no, request that the patient contact the pharmacy for the refill. If patient does not wish to contact the pharmacy document the reason why and proceed with request.) (Agent: If yes, when and what did the pharmacy advise?)  Is this the correct pharmacy for this prescription? Yes If no, delete pharmacy and type the correct one.  This is the patient's preferred pharmacy:  [id number] - [place], [place] - [address] [place] [address] Phone: [phone] Fax: [phone]   Has the prescription been filled recently? No  Is the patient out of the medication? No  Has the patient been seen for an appointment in the last year OR does the patient have an upcoming appointment? Yes  Can we respond through MyChart? Yes  Agent: Please be advised that Rx refills may take up to 3 business days. We ask that you follow-up with your pharmacy.

## 2023-12-02 ENCOUNTER — Other Ambulatory Visit: Payer: Self-pay | Admitting: Family Medicine

## 2023-12-02 DIAGNOSIS — F419 Anxiety disorder, unspecified: Secondary | ICD-10-CM

## 2023-12-02 MED ORDER — CLONAZEPAM 1 MG PO TABS
1.0000 mg | ORAL_TABLET | Freq: Three times a day (TID) | ORAL | 0 refills | Status: AC | PRN
Start: 2023-12-02 — End: ?

## 2023-12-06 ENCOUNTER — Other Ambulatory Visit: Payer: Self-pay | Admitting: Family Medicine

## 2023-12-06 ENCOUNTER — Other Ambulatory Visit (HOSPITAL_COMMUNITY): Payer: Self-pay

## 2023-12-06 ENCOUNTER — Telehealth: Payer: Self-pay

## 2023-12-06 NOTE — Telephone Encounter (Signed)
 Pharmacy Patient Advocate Encounter   Received notification from RX Request Messages that prior authorization for Fluoxetine  20mg  tabs is required/requested.   Insurance verification completed.   The patient is insured through [employer] .   Per test claim: Received a paid claim for both the Fluoxetine  60mg  and 20mg  tablets. No prior authorization required.

## 2023-12-13 ENCOUNTER — Telehealth: Payer: Self-pay | Admitting: Family Medicine

## 2023-12-13 NOTE — Telephone Encounter (Signed)
 Returned patients call and she was scheduled on 01/28/24 @11  AM.

## 2023-12-13 NOTE — Telephone Encounter (Signed)
 Copied from CRM [id number]. Topic: Appointments - Scheduling Inquiry for Clinic >> Dec 12, 2023  3:32 PM Adonis Hoot wrote: Reason for CRM: Patient will have alzheimers test done on June 17th.She would like to know if she could be placed on Dr Rodrick Clapper schedule for June 30th to discuss results?    I see there is a slot not filled but the schedule is blocked off. Please advise.

## 2023-12-26 ENCOUNTER — Other Ambulatory Visit: Payer: Self-pay | Admitting: Surgery

## 2023-12-26 DIAGNOSIS — I6522 Occlusion and stenosis of left carotid artery: Secondary | ICD-10-CM

## 2023-12-26 DIAGNOSIS — I6523 Occlusion and stenosis of bilateral carotid arteries: Secondary | ICD-10-CM

## 2023-12-26 DIAGNOSIS — I6521 Occlusion and stenosis of right carotid artery: Secondary | ICD-10-CM

## 2024-01-14 ENCOUNTER — Ambulatory Visit (HOSPITAL_COMMUNITY)
Admission: RE | Admit: 2024-01-14 | Discharge: 2024-01-14 | Disposition: A | Discharge status: Home / Self Care | Source: Ambulatory Visit | Attending: Surgery | Admitting: Surgery

## 2024-01-14 ENCOUNTER — Ambulatory Visit: Attending: Surgery | Admitting: Physician Assistant

## 2024-01-14 VITALS — BP 167/67 | HR 52 | Temp 98.2°F | Ht 62.0 in | Wt 176.6 lb

## 2024-01-14 DIAGNOSIS — I6522 Occlusion and stenosis of left carotid artery: Secondary | ICD-10-CM | POA: Diagnosis not present

## 2024-01-14 DIAGNOSIS — I6523 Occlusion and stenosis of bilateral carotid arteries: Secondary | ICD-10-CM | POA: Diagnosis not present

## 2024-01-14 DIAGNOSIS — I6521 Occlusion and stenosis of right carotid artery: Secondary | ICD-10-CM

## 2024-01-14 NOTE — Progress Notes (Signed)
 Office Note     CC:  follow up Requesting Provider:  Neda Balk, MD  HPI: Emily Mitchell is a 78 y.o. ([birth year]) female who presents for survey of carotid artery stenosis.  She has a known right ICA occlusion.  She was last seen a year ago with left ICA stenosis of 40 to 59%.  She denies any diagnosis of TIA or CVA since last office visit.  She has also without any strokelike symptoms including slurring speech, changes in vision, or one-sided weakness.  She follows regularly with her PCP for management of chronic medical conditions.  She is taking an aspirin  and statin daily.  She quit smoking 27 years ago.   Past Medical History:  Diagnosis Date   Acid reflux    Allergic rhinitis 05/19/2022   Anemia 07/05/2014   Anxiety and depression 12/14/2008   Qualifier: Diagnosis of  By: Timm Foot LPN, Concha Deed     Arthritis    fingers, toes, knees, joints (07/18/2018)   Asthma    Atherosclerotic heart disease of native coronary artery without angina pectoris 07/19/2018   Atypical chest pain 05/23/2015   Back pain 02/02/2019   Breast mass, left 04/28/2019   CAD in native artery 07/19/2018   Candidal skin infection 02/28/2018   Carotid artery disease (HCC) 06/16/2015   S/p CEA    CHF (congestive heart failure) (HCC)    Chicken pox as a child   Chronic obstructive pulmonary disease (HCC) 12/14/2008   Formatting of this note might be different from the original. Overview:  Spirometry 05/12/2013: FEV1 72% predicted FEV1 FVC ratio 73 predicted FEF 25 75  43% predicted  Last Assessment & Plan:  OK to stop taking advair Use albuterol  as needed only Check oxygen  levels during sleep - we will call you with results Call if symptoms worse   Chronic rhinitis 06/14/2018   CKD (chronic kidney disease), stage IV (HCC)    Follows with [facility], Dr Benjaman Branch    Congestive heart failure (HCC) 11/30/2015   Formatting of this note might be different from the original. Last Assessment & Plan:   No recent exacerbation   Constipation 12/17/2008   Qualifier: Diagnosis of  By: Artelia Laroche    COPD with asthma (HCC) 11/06/2017   Dry eyes 11/30/2015   Essential hypertension 10/18/2015   Formatting of this note might be different from the original. Last Assessment & Plan:  poorlycontrolled and under a great deal of stress, add Metoprolol  XL 25 mg daily. Encouraged heart healthy diet such as the DASH diet and exercise as tolerated.   Essential tremor 11/05/2013   Exudative age-related macular degeneration of right eye with active choroidal neovascularization (HCC) 08/26/2015   Gastro-esophageal reflux disease without esophagitis 12/14/2008   Formatting of this note might be different from the original. Formatting of this note might be different from the original. Formatting of this note might be different from the original. Overview:  Qualifier: Diagnosis of  By: Timm Foot LPN, Concha Deed   Last Assessment & Plan:  Avoid offending foods, start probiotics. Do not eat large meals in late evening and consider raising head of bed. Last Assessment   GERD (gastroesophageal reflux disease)    H. pylori infection 10/19/2013   H/O multiple allergies 07/14/2018   Helicobacter pylori (H. pylori) as the cause of diseases classified elsewhere 10/19/2013   Formatting of this note might be different from the original. Last Assessment & Plan:  Tetracycline , Pepto Bismol, Flagyl  and Omeprazole .  Hepatitis 1970s   don't know for sure which one it was; think it was A (07/18/2018)   History of food allergy  08/14/2018   History of pulmonary embolism 07/17/2018   Hoarseness 03/09/2019   Hypercalcemia 03/09/2019   Hyperglycemia 03/14/2016   Hyperlipidemia, mixed 05/26/2013   Crestor caused N/V Did not tolerate Lipitor    Knee pain, bilateral 12/17/2012   Left hip pain 05/23/2015   Low back pain 09/19/2015   Lower urinary tract infectious disease 10/19/2013   Lymphadenopathy 10/18/2017   Macular  degeneration of both eyes 12/16/2015   Right eye is wet Left Eye is dry Shots every 11 to 12 weeks in right eye at opthamologist office   Medicare annual wellness visit, subsequent 02/21/2015   Mixed anxiety depressive disorder 12/14/2008   Formatting of this note might be different from the original. Overview:  Qualifier: Diagnosis of  By: Timm Foot LPN, Concha Deed  Patient had increased  Tremors on Bupropion   Last Assessment & Plan:  Is struggling with the stress of her 76 year old granddaughter whom she raised moving back with her own mother, the patient is estranged from her daughter so she is very upset. Increase Citalopram  40 mg daily    Mumps 78 yrs old   Neck pain 04/22/2017   Numbness of finger 12/17/2012   Occlusion and stenosis of carotid artery without mention of cerebral infarction 12/17/2012   OSA (obstructive sleep apnea) 12/14/2008   PSG  06/2013-AHI 6/h 08/2011 (bethany) AHI 4/h No longer needs CPAP    Preventative health care 03/14/2016   Pulmonary emboli (HCC) 05/26/2013   Recurrent infection of skin 07/19/2021   Rheumatoid factor positive 09/27/2021   Right shoulder pain 10/18/2017   Sensation of foreign body in larynx 04/08/2019   Skin lesion of back 03/27/2021   Sleep apnea    don't use CPAP anymore; dr said I didn't have to (07/18/2018)   Static tremor 11/05/2013   Formatting of this note might be different from the original. Last Assessment & Plan:  Follows with [facility]   Statin intolerance 09/29/2019   Stroke [facility])    been told I've had some strokes; didn't know I'd had them (07/18/2018)   Syncope 06/11/2023   Thyroid  disease    Vitamin D  deficiency 03/03/2018   Vitreomacular adhesion of both eyes 01/07/2019   Formatting of this note might be different from the original. no traction    Past Surgical History:  Procedure Laterality Date   APPENDECTOMY  1984   CARDIAC CATHETERIZATION  ~ 2006   CAROTID ANGIOGRAPHY Left 08/20/2018   Procedure: CAROTID  ANGIOGRAPHY;  Surgeon: Margherita Shell, MD;  Location: [facility];  Service: Cardiovascular;  Laterality: Left;   CAROTID ENDARTERECTOMY Right 05/10/2007   cea   CATARACT EXTRACTION Left    CATARACT EXTRACTION W/ INTRAOCULAR LENS  IMPLANT, BILATERAL Bilateral    COLONOSCOPY  09/25/2008   normal   COLONOSCOPY WITH PROPOFOL   02/13/2022   [facility] P[facility] at [facility]   CORONARY ANGIOPLASTY WITH STENT PLACEMENT  07/18/2018   CORONARY PRESSURE/FFR STUDY N/A 07/18/2018   Procedure: INTRAVASCULAR PRESSURE WIRE/FFR STUDY;  Surgeon: Avanell Leigh, MD;  Location: [facility];  Service: Cardiovascular;  Laterality: N/A;   CORONARY STENT INTERVENTION N/A 07/18/2018   Procedure: CORONARY STENT INTERVENTION;  Surgeon: Avanell Leigh, MD;  Location: [facility];  Service: Cardiovascular;  Laterality: N/A;   JOINT REPLACEMENT     LEFT HEART CATH AND CORONARY ANGIOGRAPHY N/A  07/18/2018   Procedure: LEFT HEART CATH AND CORONARY ANGIOGRAPHY;  Surgeon: Avanell Leigh, MD;  Location: [facility] CV [facility];  Service: Cardiovascular;  Laterality: N/A;   MULTIPLE TOOTH EXTRACTIONS     TOTAL KNEE ARTHROPLASTY Bilateral 2005 - 2011   right - left   VAGINAL HYSTERECTOMY  1984   WISDOM TOOTH EXTRACTION      Social History   Socioeconomic History   Marital status: Divorced    Spouse name: Not on file   Number of children: 2   Years of education: Not on file   Highest education level: 8th grade  Occupational History   Not on file  Tobacco Use   Smoking status: Former    Current packs/day: 0.00    Average packs/day: 2.0 packs/day for 30.0 years (60.0 ttl pk-yrs)    Types: Cigarettes    Start date: 12/02/1964    Quit date: 12/03/1994    Years since quitting: 29.1   Smokeless tobacco: Never   Tobacco comments:    Quit smoking 26 years ago  Vaping Use   Vaping status: Never Used  Substance and Sexual Activity   Alcohol use: Yes    Comment: special occasions   Drug use: Never    Sexual activity: Not Currently    Birth control/protection: Post-menopausal    Comment: lives with son and friend, no dietary restrictions  Other Topics Concern   Not on file  Social History Narrative   Right handed   No caffeine   One story home   Social Drivers of Health   Financial Resource Strain: Low Risk  (07/24/2023)   Overall Financial Resource Strain (CARDIA)    Difficulty of Paying Living Expenses: Not very hard  Food Insecurity: No Food Insecurity (07/24/2023)   Hunger Vital Sign    Worried About Running Out of Food in the Last Year: Never true    Ran Out of Food in the Last Year: Never true  Transportation Needs: Unmet Transportation Needs (07/24/2023)   PRAPARE - Administrator, Civil Service (Medical): Yes    Lack of Transportation (Non-Medical): No  Physical Activity: Sufficiently Active (07/24/2023)   Exercise Vital Sign    Days of Exercise per Week: 3 days    Minutes of Exercise per Session: 60 min  Recent Concern: Physical Activity - Inactive (05/02/2023)   Exercise Vital Sign    Days of Exercise per Week: 0 days    Minutes of Exercise per Session: 0 min  Stress: Stress Concern Present (07/24/2023)   [employer] of Occupational Health - Occupational Stress Questionnaire    Feeling of Stress : To some extent  Social Connections: Socially Isolated (07/24/2023)   Social Connection and Isolation Panel    Frequency of Communication with Friends and Family: More than three times a week    Frequency of Social Gatherings with Friends and Family: Once a week    Attends Religious Services: Never    Database administrator or Organizations: No    Attends Banker Meetings: Never    Marital Status: Divorced  Catering manager Violence: Not At Risk (05/02/2023)   Humiliation, Afraid, Rape, and Kick questionnaire    Fear of Current or Ex-Partner: No    Emotionally Abused: No    Physically Abused: No    Sexually Abused: No    Family  History  Problem Relation Age of Onset   Stroke Mother        mini stroke   Kidney  disease Mother    Heart failure Mother    Hypertension Mother    Diabetes Sister        type 2   Kidney disease Sister    Allergic rhinitis Sister    Osteoarthritis Sister    Hyperlipidemia Sister    Heart attack Maternal Grandfather    Colon cancer Neg Hx    Stomach cancer Neg Hx    Rectal cancer Neg Hx     Current Outpatient Medications  Medication Sig Dispense Refill   acetaminophen  (TYLENOL ) 500 MG tablet Take 500 mg by mouth as needed for headache.     albuterol  (VENTOLIN  HFA) 108 (90 Base) MCG/ACT inhaler Inhale 2 puffs into the lungs every 6 (six) hours as needed for wheezing or shortness of breath. 18 g 3   aspirin  EC 81 MG tablet Take 1 tablet (81 mg total) by mouth daily. 90 tablet 3   atorvastatin  (LIPITOR) 10 MG tablet Take 1 tablet (10 mg total) by mouth daily. 90 tablet 1   busPIRone  (BUSPAR ) 5 MG tablet TAKE 1 TABLET BY MOUTH THREE TIMES A DAY 270 tablet 1   Calcium  Citrate-Vitamin D  200-250 MG-UNIT TABS Take 1 tablet by mouth daily.     Cholecalciferol 25 MCG (1000 UT) tablet Take 1,000 Units by mouth daily.     Choline Fenofibrate  (FENOFIBRIC ACID ) 135 MG CPDR Take 1 tablet by mouth daily. 90 capsule 1   clonazePAM  (KLONOPIN ) 1 MG tablet Take 1 tablet (1 mg total) by mouth 3 (three) times daily as needed for anxiety. 90 tablet 0   cyanocobalamin  100 MCG tablet Take 500 mcg by mouth daily.     esomeprazole  (NEXIUM ) 40 MG capsule TAKE 1 CAPSULE (40 MG TOTAL) BY MOUTH DAILY. 90 capsule 1   ezetimibe  (ZETIA ) 10 MG tablet TAKE 1 TABLET BY MOUTH EVERY DAY 90 tablet 1   gabapentin  (NEURONTIN ) 100 MG capsule TAKE 2 CAPSULES BY MOUTH AT BEDTIME 180 capsule 1   levothyroxine  (SYNTHROID ) 100 MCG tablet TAKE 1 TABLET BY MOUTH EVERY DAY 90 tablet 2   metoprolol  succinate (TOPROL -XL) 25 MG 24 hr tablet Take 1 tablet (25 mg total) by mouth daily. 30 tablet 3   montelukast  (SINGULAIR ) 10 MG tablet  TAKE 1 TABLET BY MOUTH AT BEDTIME AS NEEDED. 90 tablet 1   NON FORMULARY Take 1 tablet by mouth daily. Digestive advantage.     Omega-3 Fatty Acids (FISH OIL PO) Take 1 capsule by mouth daily.     primidone  (MYSOLINE ) 50 MG tablet TAKE 1 TABLET BY MOUTH TWICE A DAY 180 tablet 0   Probiotic Product (VSL#3) CAPS Take 1 capsule by mouth daily.     BREZTRI  AEROSPHERE 160-9-4.8 MCG/ACT AERO INHALE 2 PUFFS TWICE DAILY IN MORNING AND IN THE EVENING (Patient not taking: Reported on 01/14/2024) 10.7 each 4   cyclobenzaprine  (FLEXERIL ) 10 MG tablet Take 1 tablet (10 mg total) by mouth at bedtime as needed. (Patient not taking: Reported on 01/14/2024) 30 tablet 1   doxycycline  (VIBRA -TABS) 100 MG tablet Take 1 tablet (100 mg total) by mouth 2 (two) times daily. (Patient not taking: Reported on 01/14/2024) 20 tablet 0   FLUoxetine  (PROZAC ) 20 MG tablet TAKE 1 TABLET BY MOUTH EVERY DAY (Patient not taking: Reported on 01/14/2024) 90 tablet 1   methylPREDNISolone  (MEDROL ) 4 MG tablet 6 tabs po x 1 1 day then 5 tabs po x 1 day then 4 tabs po x 1 day then 3 tabs po x 1 day then  2 tabs po x 1 day then 1 tab po x 1 day and stop (Patient not taking: Reported on 01/14/2024) 21 tablet 0   No current facility-administered medications for this visit.    Allergies  Allergen Reactions   Niaspan [Niacin Er (Antihyperlipidemic)] Nausea And Vomiting and Swelling    Swelling in mouth   Pantoprazole      Mouth sores   Rosuvastatin Calcium  Anaphylaxis   Sulfonamide Derivatives Hives   Bupropion  Other (See Comments)    Uncontrollable shakes   Nitroglycerin      NOT allergic - cardiology has recommended she NOT use this due to h/o severe carotid disease as it may decrease cerebral perfusion   Penicillins Hives    DID THE REACTION INVOLVE: Swelling of the face/tongue/throat, SOB, or low BP? Yes Sudden or severe rash/hives, skin peeling, or the inside of the mouth or nose? Unknown Did it require medical treatment? Unknown When  did it last happen?   [age]  If all above answers are "NO", may proceed with cephalosporin use.   Statins    Apple Rash   Apple Juice Rash   Banana Rash     REVIEW OF SYSTEMS:   [X]  denotes positive finding, [ ]  denotes negative finding Cardiac  Comments:  Chest pain or chest pressure:    Shortness of breath upon exertion:    Short of breath when lying flat:    Irregular heart rhythm:        Vascular    Pain in calf, thigh, or hip brought on by ambulation:    Pain in feet at night that wakes you up from your sleep:     Blood clot in your veins:    Leg swelling:         Pulmonary    Oxygen  at home:    Productive cough:     Wheezing:         Neurologic    Sudden weakness in arms or legs:     Sudden numbness in arms or legs:     Sudden onset of difficulty speaking or slurred speech:    Temporary loss of vision in one eye:     Problems with dizziness:         Gastrointestinal    Blood in stool:     Vomited blood:         Genitourinary    Burning when urinating:     Blood in urine:        Psychiatric    Major depression:         Hematologic    Bleeding problems:    Problems with blood clotting too easily:        Skin    Rashes or ulcers:        Constitutional    Fever or chills:      PHYSICAL EXAMINATION:  Vitals:   01/14/24 1310 01/14/24 1312  BP: (!) 165/79 (!) 167/67  Pulse: (!) 52   Temp: 98.2 F (36.8 C)   TempSrc: Temporal   SpO2: 93%   Weight: 176 lb 9.6 oz (80.1 kg)   Height: 5' 2 (1.575 m)     General:  WDWN in NAD; vital signs documented above Gait: Not observed HENT: WNL, normocephalic Pulmonary: normal non-labored breathing Cardiac: regular HR Abdomen: soft, NT, no masses Skin: without rashes Vascular Exam/Pulses: symmetrical radial pulses Extremities: without ischemic changes, without Gangrene , without cellulitis; without open wounds;  Musculoskeletal: no muscle wasting or atrophy  Neurologic:  A&O X 3 Psychiatric:  The pt has  Normal affect.   Non-Invasive Vascular Imaging:   R ICA occluded L ICA 40-59% stenosis    ASSESSMENT/PLAN:: 78 y.o. female here for follow up for surveillance of carotid artery stenosis  Carotid duplex stable with 40 to 59% stenosis of the left ICA.  No indication for revascularization of asymptomatic stenosis at this time.  Right ICA is known to be occluded.  She will continue her aspirin  and statin daily.  We will repeat left ICA duplex in 1 year.  She will continue to follow regularly with her PCP for management of chronic medical conditions including hypertension and hyperlipidemia.   Cordie Deters, PA-C [facility] [phone]  Clinic MD:   Charlotte Cookey

## 2024-01-15 ENCOUNTER — Other Ambulatory Visit: Payer: Self-pay | Admitting: Family Medicine

## 2024-01-15 ENCOUNTER — Ambulatory Visit: Payer: Self-pay | Admitting: Psychology

## 2024-01-15 ENCOUNTER — Ambulatory Visit (INDEPENDENT_AMBULATORY_CARE_PROVIDER_SITE_OTHER): Admitting: Psychology

## 2024-01-15 DIAGNOSIS — F32A Depression, unspecified: Secondary | ICD-10-CM

## 2024-01-15 DIAGNOSIS — G3184 Mild cognitive impairment, so stated: Secondary | ICD-10-CM | POA: Diagnosis not present

## 2024-01-15 DIAGNOSIS — R4189 Other symptoms and signs involving cognitive functions and awareness: Secondary | ICD-10-CM

## 2024-01-15 NOTE — Telephone Encounter (Unsigned)
 Copied from CRM [id number]. Topic: Clinical - Medication Refill >> Jan 15, 2024  1:22 PM Trula Gable C wrote: Medication: clonazePAM  (KLONOPIN ) 1 MG tablet  Has the patient contacted their pharmacy? Yes (Agent: If no, request that the patient contact the pharmacy for the refill. If patient does not wish to contact the pharmacy document the reason why and proceed with request.) (Agent: If yes, when and what did the pharmacy advise?)  This is the patient's preferred pharmacy:  [id number] [place], [place] - [address] [place] [address] Phone: [phone] Fax: [phone]   Is this the correct pharmacy for this prescription? Yes If no, delete pharmacy and type the correct one.   Has the prescription been filled recently? No  Is the patient out of the medication? Yes  Has the patient been seen for an appointment in the last year OR does the patient have an upcoming appointment? Yes  Can we respond through MyChart? Yes  Agent: Please be advised that Rx refills may take up to 3 business days. We ask that you follow-up with your pharmacy.

## 2024-01-15 NOTE — Progress Notes (Addendum)
 NEUROPSYCHOLOGICAL EVALUATION [place]. [facility]  [place] Department of Neurology  Date of Evaluation: 01/15/2024  REASON FOR REFERRAL   Emily Mitchell is a 78 year old, right-handed, White female with eight years of formal education. She was referred for neuropsychological evaluation by her neurologist, Asberry Tat, D.O., to assess current neurocognitive functioning, document potential cognitive deficits, and assist with treatment planning. This is her first neuropsychological evaluation.  SUMMARY OF RESULTS   Premorbid cognitive abilities are estimated to be in the below average range based on word reading and sociodemographic factors. It is important to recognize that the patient has a history of limited education with interrupted schooling and ongoing academic challenges (see Educational and Occupational History below for more details). As such, the possibility of a learning disability cannot be excluded.  Relative to this baseline estimate, performance today was below expectation on most tasks of processing speed as well as a number of executive functioning tasks (i.e., response inhibition, phonemic fluency, verbal abstract reasoning). She also demonstrated variability on measures of learning/memory. For example, performance was low when encoding, recalling, and recognizing a word list; however, it was intact when learning and recalling short stories, possibly due to the more structured nature of the story format. Although she struggled with the immediate and delayed recall of shapes, her ability to recognize these shapes was within normal limits.  Other cognitive abilities--including working memory, visual scanning, alternating attention, visual abstract reasoning, semantic fluency, confrontation naming, and visuospatial skills--were within age-related expectations.  On self-report questionnaires, she did not endorse clinically-elevated levels of depression or  anxiety.  DIAGNOSTIC IMPRESSION   Results of the current evaluation indicated relative weaknesses in processing speed, executive functioning, and learning/memory (i.e., variable encoding and recall but largely preserved storage). In the setting of preserved functional independence, findings are consistent with a diagnosis of mild cognitive impairment. Etiology of the deficits identified today is most likely multifactorial, including her history of essential tremor, reduced cognitive and physical stimulation, persistent mood symptoms, and chronic vascular risk factors with mild cerebrovascular pathology noted on neuroimaging. Given her family history, the patient and her son expressed concern about the possibility of Alzheimer's disease. While this concern is understandable, her current cognitive profile is not strongly suggestive of an underlying Alzheimer's disease process, though this cannot be definitively ruled out. Results further serve as a baseline for future comparison, should it ever become necessary to re-evaluate the patient.  ICD-10 Codes: G31.84 Mild cognitive impairment  RECOMMENDATIONS   In consultation with your doctor, schedule cognitive reevaluation on an as-needed basis to assess for cognitive decline and update treatment recommendations. Reevaluation should occur during a period of medical and affective stability.  Given the patient's current living situation with her son and a roommate, and as she is temporarily not driving, she has been largely isolated at home with limited opportunities for social, physical, or cognitive engagement--an important concern that should be addressed to support her overall mental, emotional, and cognitive well-being. Exploring options to increase her access to outside activities, social interaction, or supportive services--such as adult day programs, transportation assistance, or community engagement opportunities--could greatly improve her quality of  life and cognitive stimulation. Maintaining strong social connections and regularly stimulating your brain can help protect against cognitive decline. This includes staying connected with friends and family, volunteering, or participating in community groups. Mentally engaging activities--such as reading, doing puzzles, playing strategy games, or learning a new language or musical instrument--promote brain plasticity. If you are interested in activities to support cognitive  engagement, this site offers a variety of apps and games organized by difficulty level:  https://www.[facility].org/get-to-know-barrow/centers-programs/neurorehabilitation-center/neuro-rehab-apps-and-games/  Continue treatment for depression and anxiety, especially given that emotional distress can exacerbate cognitive difficulties. Discuss current medication regimen with your prescribing provider to ensure you are receiving maximum benefit. You may also wish to consider stress reduction techniques such as mindfulness, deep breathing exercises.  Continue managing vascular risk factors through a heart healthy diet (e.g., MIND, Mediterranean), physician-approved physical activity, and medication adherence.   Performance on neurocognitive testing is not a strong predictor of an individual's safety operating a motor vehicle, although reduced processing speed and executive functioning raise some concerns. Should her family wish to pursue a formalized driving evaluation, they could reach out to the following agencies:  [employer] [facility] in [place]: [phone] [facility]: [phone] [facility]: [phone] [facility]: [phone] or [phone]  Consider implementing compensatory strategies to maximize independence and maintain daily functioning. Examples include:   Adhere to routine. Compensatory strategies work best when they are used consistently. Use a planner, calendar, or  white board that has the schedule and important events for the day clearly listed to reference and cross off when tasks are complete.   Ask for written information, especially if it is new or unfamiliar (e.g., information provided at a doctor's appointment).   Create an organized environment. Keep items that can be easily misplaced in a sensible location and get into the habit of always returning the items to those places.   Pay attention and reduce distractions. Make a point of focusing attention on information you want to remember. One-on-one interaction is more likely to facilitate attention and minimize distraction. Make eye contact and repeat the information out loud after you hear it. Reduce interruptions or distractions especially when attempting to learn new information.   Create associations. When learning something new, think about and understand the information. Explain it in your own words or try to associate it with something you already know. Take notes to help remember important details.  Evaluate goals and plan accordingly. When confronted by many different tasks, begin by making a list that prioritizes each task and estimates the time it will take to complete. Break down complicated tasks into smaller, more manageable steps.   Focus on one task at a time and complete each task before starting another. Avoid multitasking.  DISPOSITION   Patient will follow up with the referring provider, Dr. Evonnie. No follow-up neuropsychological testing was scheduled at this time. Please feel free to refer the patient for repeated evaluation if she shows a significant change in neurocognitive status. She and her son will be provided verbal feedback in approximately one week regarding the findings and impression during this visit.  The remainder of the report includes the details of the patient's background and a table of results from the current evaluation, which support the summary and  recommendations described above.  BACKGROUND   History of Presenting Illness: The following information was obtained from a review of medical records and an interview with the patient and her son, Adina. Patient has been under the care of Dr. Evonnie in neurology since 2015 for the management of essential tremor (please refer to the medical record for a more detailed history of essential tremor). As symptoms have gradually worsened with time, options for both medication and surgical therapies were discussed. Patient agreed  to try primidone  in August 2024; she has noticed significant improvement since. A DaTscan  was also performed due to the  development of a resting tremor on the right side, though this was believed to most likely be a consequence of longstanding essential tremor. DaTscan  (04/13/2023) was unremarkable. More recently, during a primary care visit in April 2025, additional concerns were raised regarding memory difficulties in the context of mood changes. As such, she was referred for a neuropsychological evaluation.  Cognitive Functioning: During today's appointment, the patient denied any cognitive concerns. In comparison, her son reported progressive cognitive changes over the past few years, with more noticeable decline in the past six months. Specifically, he has observed short-term memory difficulties, such as her repeating herself or forgetting details of conversations. He also noted slower processing speed and occasional attention difficulties. He denied significant concerns with word finding, navigation, and executive functioning (e.g., planning, organizing).   Physical Functioning: Patient denied difficulties with both sleep initiation and maintenance. She reported that she does not use a CPAP machine but was told by her doctor that it was okay for her to stop using it because she did not really need it. Appetite is stable. No changes to sense of taste or smell were reported.  Vision--despite macular degeneration--and hearing are reportedly stable; however, her son noted there may be some mild hearing loss. Patient denied balance difficulties and has not experienced a fall since Fall 2024. Both she and her son feel that her hand tremors have improved by approximately 90% bilaterally since starting on the medication. A jaw tremor was observed.  Emotional Functioning: Patient described her recent mood as good, though she noted that she does not often enjoy being alone. She denied suicidal ideation. She finds joy in spending time with her grandchildren and great-grandchildren, enjoys coloring and doing puzzles on her tablet, and keeps herself occupied with household chores.  Imaging: CT of the head (04/09/2023) documented mild age-related atrophy and chronic microvascular ischemic changes. DaTscan  (04/13/2023) was unremarkable.  Other Relevant Medical History: Remarkable for hypertension, hyperlipidemia, congestive heart failure, carotid artery disease, coronary artery disease, chronic obstructive pulmonary disease, obstructive sleep apnea, h/o pulmonary emboli, asthma, hyperglycemia, thyroid  disease, chronic kidney disease stage IV, gastroesophageal reflux disease, and macular degeneration. Please refer to the medical record for more comprehensive problem list. Patient denied a history of stroke, although she noted being told in the past that she might have experienced mini strokes. No history of CNS infection, head injury, or seizure was reported.  Current Medications: Per record, acetaminophen , albuterol , aspirin , atorvastatin , calcium  citrate-vitamin D , choline fenofibrate , clonazepam , vitamin B12, esomeprazole , ezetimibe , gabapentin , levothyroxine , metoprolol , montelukast , omega-3 fatty acids, primidone , probiotic product, and vitamin D3. Patient is not taking buspirone , stating she was told to stop, though her son believes she should still be on it. Her primary care provider's  notes do not indicate that discontinuation was recommended.  Functional Status: Patient is not currently driving, following her children's recommendation to wait until after this neuropsychological evaluation. She continues to independently manage her medications without issues. Her son handles the finances, though he and the patient denied that this was due to any financial mismanagement. Patient denied difficulty using household appliances. She independently performs all basic activities of daily living without difficulty.   Family Neurological History: Remarkable for Alzheimer's disease (mother, maternal grandmother).  Psychiatric History: Remarkable for anxiety and depression, currently managed with medication. History of counseling, suicidal ideation, hallucinations, and psychiatric hospitalizations was not reported.  Substance Use History: Patient denied current use of alcohol, nicotine, marijuana, and illicit substances. She quit smoking approximately 27 years ago.  Social and Developmental  History: Patient was born in [place], [place]. History of perinatal complications and developmental delays was not reported. She is single (previously divorced) and has two children. She currently resides with her son and a roommate.  Educational and Occupational History: When the patient was 78 years old, she was diagnosed with Bright's disease (kidney disease) and was bedridden for six months, which prevented her from completing the sixth grade. She later returned to school, repeated sixth grade, and progressed through the eighth grade. However, she noted ongoing academic struggles, particularly with reading, and felt she was consistently behind her peers. 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  BEHAVIORAL OBSERVATIONS    Patient arrived on time and was accompanied by her son, Adina. She ambulated independently and without gait disturbance. She was alert and fully oriented. She was appropriately groomed and dressed for the setting. Tremor was observed in her jaw. Vision (with glasses) and hearing were adequate for testing purposes. Speech was of normal rate, prosody, and volume. No conversational word-finding difficulties, paraphasic errors, or dysarthria were observed. Comprehension was conversationally intact. Thought processes were linear, logical, and coherent. Thought content was organized and devoid of delusions. Insight appeared appropriate. Affect was generally even, though she became tearful when discussing driving. She was cooperative and gave adequate effort during testing, including on standalone and embedded measures of performance validity. Results are thought to accurately reflect her cognitive functioning at this time.  NEUROPSYCHOLOGICAL TESTING RESULTS   Tests Administered: Animal Naming Test; Brief Visuospatial Memory Test-Revised (BVMT-R) - Form 1; Controlled Oral Word Association Test (COWAT): FAS; Delis-Kaplan Executive Function System (D-KEFS) - Subtest(s): Color-Word Interference Test; Geriatric Anxiety Scale-10 Item (GAS-10); Geriatric Depression Scale Short Form (GDS-SF); Hooper Visual Organization Test (VOT); Hopkins Verbal Learning Test Revised (HVLT-R) - From 1; Neuropsychological Assessment Battery (NAB) - Subtest(s): Naming Form 1; Standalone performance validity test (PVT); Test of Premorbid Functioning (TOPF); Trail Making Test (TMT); Wechsler Adult Intelligence Scale Fifth Edition (WAIS-5) - Subtest(s): Similarities, Clinical cytogeneticist, Matrix Reasoning, Digit Sequencing, Coding, Running Digits, Symbol Search, Symbol Span; and Wechsler Memory Scale Fourth Edition (WMS-IV) - Subtest(s): Logical Memory (LM).  Test results are provided in the table below. Whenever possible, the patient's scores were  compared against age-, sex-, and education-corrected normative samples. Interpretive descriptions are based on the AACN consensus conference statement on uniform labeling (Guilmette et al., 2020).  PREMORBID FUNCTIONING RAW  RANGE  TOPF 13 StdS=75 Below Average  ATTENTION & WORKING MEMORY RAW  RANGE  WAIS-5 Digit Sequencing -- ss=8 Average  WAIS-5 Running Digits -- ss=4 Below Average  WAIS-5 Symbol Span -- ss=6 Low Average  PROCESSING SPEED RAW  RANGE  Trails A 59''0e T=47 Average  WAIS-5 Coding  -- ss=5 Below Average  WAIS-5 Symbol Search -- ss=4 Below Average  DKEFS CWIT Color Naming 59''2e ss=1 Exceptionally Low  DKEFS CWIT Word Reading 39''0e ss=3 Exceptionally Low  EXECUTIVE FUNCTION RAW  RANGE  Trails B 225''2e T=44 Average  WAIS-5 Matrix Reasoning -- ss=7 Low Average  WAIS-5 Similarities -- ss=4 Below Average  COWAT Letter Fluency 4+4+4 T=26 Exceptionally Low  DKEFS CWIT Inhibition 101'' ss=6 Low Average  Total Errors 34 ss=1 Exceptionally Low  DKEFS CWIT Inhibition/Switching 61'' ss=12 High Average  Total Errors 25 ss=1 Exceptionally Low  LANGUAGE RAW  RANGE  COWAT Letter Fluency 4+4+4  T=26 Exceptionally Low  Animal Naming Test 13 T=42 Low Average  NAB Naming Test 29/31 T=51 WNL  VISUOSPATIAL RAW  RANGE  WAIS-5 Block Design -- ss=5 Below Average  HVOT -- T=38 Low Average  BVMT-R Copy Trial 12/12 -- WNL  VERBAL LEARNING & MEMORY RAW  RANGE  HVLT Learning Trials (4+4+5)/36 T=31 Below Average  HVLT Delayed Recall 2/12 T=27 Exceptionally Low  HVLT Recognition Hits 8 -- --  HVLT Recognition False Positives 2 -- --  HVLT Discrimination Index 6 T=26 Exceptionally Low  WMS-IV LM-I  (4+6+8)/53 ss=6 Low Average  WMS-IV LM-II  (2+8)/39 ss=8 Average  WMS-IV LM Recognition  (6+9)/23 17-25%ile Low Average  VISUAL LEARNING & MEMORY RAW  RANGE  BVMT-R Total Recall (4+1+4)/36 T=31 Below Average  BVMT-R Delayed Recall 1/12 T=23 Exceptionally Low  BVMT-R Percent Retained 25 <1%ile  Exceptionally Low  BVMT-R Recognition Hits 6 >16%ile WNL  BVMT-R Recognition False Alarms 1 11-16%ile Low Average  BVMT-R Recognition Discrimination Index 5 >16%ile WNL  QUESTIONNAIRES RAW  RANGE  GDS-SF 3 -- Minimal  GAS-10 1 -- Minimal  *Note: ss = scaled score; StdS = standard score; T = t-score; C/S = corrected raw score; WNL = within normal limits; BNL= below normal limits; D/C = discontinued. Scores from skewed distributions are typically interpreted as WNL (>=16th %ile) or BNL (<16th %ile).   INFORMED CONSENT   Patient was provided with a verbal description of the nature and purpose of the neuropsychological evaluation. Also reviewed were the foreseeable risks and/or discomforts and benefits of the procedure, limits of confidentiality, and mandatory reporting requirements of this provider. Patient was given the opportunity to have their questions answered. Oral consent to participate was provided by the patient.   This report was prepared as part of a clinical evaluation and is not intended for forensic use.  SERVICE   This evaluation was conducted by Renda Beckwith, Psy.D. In addition to time spent directly with the patient, total professional time (180 minutes) includes record review, integration of relevant medical history, test selection, interpretation of findings, and report preparation. A technician, Lonell Jude, B.S., provided testing and scoring assistance for 125 minutes.  Psychiatric Diagnostic Evaluation Services (Professional): 09208 x 1 Neuropsychological Testing Evaluation Services (Professional): 03867 x 1 Neuropsychological Testing Evaluation Services (Professional): 03866 x 2 Neuropsychological Test Administration and Scoring (Technician): [phone] x 1 Neuropsychological Test Administration and Scoring (Technician): [phone] x 3  This report was generated using voice recognition software. While this document has been carefully reviewed, transcription errors may be  present. I apologize in advance for any inconvenience. Please contact me if further clarification is needed.            Renda Beckwith, Psy.D.             Neuropsychologist

## 2024-01-15 NOTE — Progress Notes (Signed)
   Psychometrician Note   Cognitive testing was administered to Franciso Irwin by Arnulfo Larch, B.S. (psychometrist) under the supervision of Dr. Janice Meeter, Psy.D., licensed psychologist on 01/15/2024. Ms. Fraiser did not appear overtly distressed by the testing session per behavioral observation or responses across self-report questionnaires. Rest breaks were offered.    The battery of tests administered was selected by Dr. Janice Meeter, Psy.D. with consideration to Ms. Lotter's current level of functioning, the nature of her symptoms, emotional and behavioral responses during interview, level of literacy, observed level of motivation/effort, and the nature of the referral question. This battery was communicated to the psychometrist. Communication between Dr. Janice Meeter, Psy.D. and the psychometrist was ongoing throughout the evaluation and Dr. Janice Meeter, Psy.D. was immediately accessible at all times. Dr. Janice Meeter, Psy.D. provided supervision to the psychometrist on the date of this service to the extent necessary to assure the quality of all services provided.    Franciso Irwin will return within approximately 1-2 weeks for an interactive feedback session with Dr. Donavon Fudge at which time her test performances, clinical impressions, and treatment recommendations will be reviewed in detail. Ms. Gilleland understands she can contact our office should she require our assistance before this time.  A total of 125 minutes of billable time were spent face-to-face with Ms. Vigorito by the psychometrist. This includes both test administration and scoring time. Billing for these services is reflected in the clinical report generated by Dr. Janice Meeter, Psy.D.  This note reflects time spent with the psychometrician and does not include test scores or any clinical interpretations made by Dr. Donavon Fudge. The full report will follow in a separate note.

## 2024-01-16 MED ORDER — CLONAZEPAM 1 MG PO TABS
1.0000 mg | ORAL_TABLET | Freq: Three times a day (TID) | ORAL | 0 refills | Status: DC | PRN
Start: 2024-01-16 — End: 2024-04-16

## 2024-01-16 NOTE — Telephone Encounter (Signed)
 Requesting: Klonopin  1 mg Contract: 01/03/2023 UDS: 01/03/2023 Last Visit: 11/08/2023 Next Visit: 01/28/2024 Last Refill: 12/02/2023  Please Advise

## 2024-01-23 DIAGNOSIS — N1832 Chronic kidney disease, stage 3b: Secondary | ICD-10-CM | POA: Diagnosis not present

## 2024-01-25 ENCOUNTER — Ambulatory Visit (INDEPENDENT_AMBULATORY_CARE_PROVIDER_SITE_OTHER): Admitting: Psychology

## 2024-01-25 DIAGNOSIS — G3184 Mild cognitive impairment, so stated: Secondary | ICD-10-CM

## 2024-01-25 NOTE — Progress Notes (Signed)
   NEUROPSYCHOLOGY FEEDBACK SESSION [place]. [facility]  [place] Department of Neurology  Date of Feedback Session: 01/25/2024  REASON FOR REFERRAL   Emily Mitchell is a 78 year old, right-handed, White female with eight years of formal education. She was referred for neuropsychological evaluation by her neurologist, Asberry Tat, D.O., to assess current neurocognitive functioning, document potential cognitive deficits, and assist with treatment planning. This is her first neuropsychological evaluation.  FEEDBACK   Patient completed a comprehensive neuropsychological evaluation on 01/15/2024. Please refer to that encounter for the full report and recommendations. Briefly, results indicated relative weaknesses in processing speed, executive functioning, and learning/memory (i.e., variable encoding and recall but largely preserved storage). In the setting of preserved functional independence, findings are consistent with a diagnosis of mild cognitive impairment. Etiology of the deficits identified today is most likely multifactorial, including her history of essential tremor, reduced cognitive and physical stimulation, persistent mood symptoms, and chronic vascular risk factors with mild cerebrovascular pathology noted on neuroimaging. Given her family history, the patient and her son expressed concern about the possibility of Alzheimer's disease. While this concern is understandable, her current cognitive profile is not strongly suggestive of an underlying Alzheimer's disease process, though this cannot be definitively ruled out.   Today, the patient was accompanied by daughter and granddaughter. They were provided verbal feedback regarding the findings and impression during this visit, and their questions were answered. A copy of the report was provided at the conclusion of the visit.  DISPOSITION   Patient will follow up with the referring provider, Dr. Evonnie. No follow-up  neuropsychological testing was scheduled at this time. Please feel free to refer the patient for repeated evaluation if she shows a significant change in neurocognitive status.  SERVICE   This feedback session was conducted by Renda Beckwith, Psy.D. [place] of 03867 (45 minutes) was billed for Dr. Beckwith' time spent in preparing, conducting, and documenting the current feedback session.  This report was generated using voice recognition software. While this document has been carefully reviewed, transcription errors may be present. I apologize in advance for any inconvenience. Please contact me if further clarification is needed.

## 2024-01-27 NOTE — Assessment & Plan Note (Signed)
 Tolerating statin, encouraged heart healthy diet, avoid trans fats, minimize simple carbs and saturated fats. Increase exercise as tolerated

## 2024-01-27 NOTE — Assessment & Plan Note (Signed)
 Well controlled, no changes to meds. Encouraged heart healthy diet such as the DASH diet and exercise as tolerated.

## 2024-01-27 NOTE — Assessment & Plan Note (Signed)
 Supplement and monitor

## 2024-01-27 NOTE — Assessment & Plan Note (Signed)
 On Levothyroxine, continue to monitor

## 2024-01-27 NOTE — Assessment & Plan Note (Signed)
 hgba1c acceptable, minimize simple carbs. Increase exercise as tolerated.

## 2024-01-27 NOTE — Assessment & Plan Note (Signed)
Intolerant of statins

## 2024-01-27 NOTE — Assessment & Plan Note (Signed)
 Hydrate and monitor

## 2024-01-28 ENCOUNTER — Encounter: Payer: Self-pay | Admitting: Family Medicine

## 2024-01-28 ENCOUNTER — Ambulatory Visit (INDEPENDENT_AMBULATORY_CARE_PROVIDER_SITE_OTHER): Admitting: Family Medicine

## 2024-01-28 VITALS — BP 132/84 | HR 60 | Resp 16 | Ht 62.0 in | Wt 176.0 lb

## 2024-01-28 DIAGNOSIS — N184 Chronic kidney disease, stage 4 (severe): Secondary | ICD-10-CM

## 2024-01-28 DIAGNOSIS — J441 Chronic obstructive pulmonary disease with (acute) exacerbation: Secondary | ICD-10-CM

## 2024-01-28 DIAGNOSIS — E782 Mixed hyperlipidemia: Secondary | ICD-10-CM

## 2024-01-28 DIAGNOSIS — E079 Disorder of thyroid, unspecified: Secondary | ICD-10-CM | POA: Diagnosis not present

## 2024-01-28 DIAGNOSIS — Z789 Other specified health status: Secondary | ICD-10-CM

## 2024-01-28 DIAGNOSIS — R7989 Other specified abnormal findings of blood chemistry: Secondary | ICD-10-CM

## 2024-01-28 DIAGNOSIS — I10 Essential (primary) hypertension: Secondary | ICD-10-CM

## 2024-01-28 DIAGNOSIS — E559 Vitamin D deficiency, unspecified: Secondary | ICD-10-CM

## 2024-01-28 DIAGNOSIS — G3184 Mild cognitive impairment, so stated: Secondary | ICD-10-CM

## 2024-01-28 DIAGNOSIS — R739 Hyperglycemia, unspecified: Secondary | ICD-10-CM

## 2024-01-28 HISTORY — DX: Mild cognitive impairment of uncertain or unknown etiology: G31.84

## 2024-01-28 LAB — T3, FREE: T3, Free: 2.7 pg/mL (ref 2.3–4.2)

## 2024-01-28 LAB — TSH: TSH: 2.26 u[IU]/mL (ref 0.35–5.50)

## 2024-01-28 LAB — T4, FREE: Free T4: 0.95 ng/dL (ref 0.60–1.60)

## 2024-01-28 NOTE — Patient Instructions (Signed)
 5 ounces of water every hour upon arising will help with dehydration Protein every 3-4 hours, eggs, dairy, meat, protein powders ie Whey, yellow split pea powder, collagen powder

## 2024-01-28 NOTE — Progress Notes (Signed)
 Subjective:    Patient ID: Emily Mitchell, female    DOB: Mar 30, 1946, 78 y.o.   MRN: [id number]  Chief Complaint  Patient presents with   Medical Management of Chronic Issues    Patient presents today for a 2 month follow-up.   Quality Metric Gaps    TDAP    HPI Patient is in today for follow up on chronic medical concerns. No recent febrile illness or hospitalizations. Denies CP/palp/SOB/HA/congestion/fevers/GI or GU c/o. Taking meds as prescribed. She is now being cared for at neurology and is doing better at home. Her daughter has moved back to the area and is helping patient in coordination with patient's son as well. They are having her driving evaluated and she is taking a class so they can decide if she can drive locally. She feels she is handling her ADLs better at home and is eating and drinking fluids well.   Past Medical History:  Diagnosis Date   Acid reflux    Allergic rhinitis 05/19/2022   Anemia 07/05/2014   Anxiety and depression 12/14/2008   Qualifier: Diagnosis of  By: Gerlean LPN, Mliss     Arthritis    fingers, toes, knees, joints (07/18/2018)   Asthma    Atherosclerotic heart disease of native coronary artery without angina pectoris 07/19/2018   Atypical chest pain 05/23/2015   Back pain 02/02/2019   Breast mass, left 04/28/2019   CAD in native artery 07/19/2018   Candidal skin infection 02/28/2018   Carotid artery disease (HCC) 06/16/2015   S/p CEA    CHF (congestive heart failure) (HCC)    Chicken pox as a child   Chronic obstructive pulmonary disease (HCC) 12/14/2008   Formatting of this note might be different from the original. Overview:  Spirometry 05/12/2013: FEV1 72% predicted FEV1 FVC ratio 73 predicted FEF 25 75  43% predicted  Last Assessment & Plan:  OK to stop taking advair Use albuterol  as needed only Check oxygen  levels during sleep - we will call you with results Call if symptoms worse   Chronic rhinitis 06/14/2018   CKD (chronic  kidney disease), stage IV (HCC)    Follows with [facility], Dr Fairy Shingles    Congestive heart failure (HCC) 11/30/2015   Formatting of this note might be different from the original. Last Assessment & Plan:  No recent exacerbation   Constipation 12/17/2008   Qualifier: Diagnosis of  By: Ezzard DEVONNA Sonny CANDIE    COPD with asthma (HCC) 11/06/2017   Dry eyes 11/30/2015   Essential hypertension 10/18/2015   Formatting of this note might be different from the original. Last Assessment & Plan:  poorlycontrolled and under a great deal of stress, add Metoprolol  XL 25 mg daily. Encouraged heart healthy diet such as the DASH diet and exercise as tolerated.   Essential tremor 11/05/2013   Exudative age-related macular degeneration of right eye with active choroidal neovascularization (HCC) 08/26/2015   Gastro-esophageal reflux disease without esophagitis 12/14/2008   Formatting of this note might be different from the original. Formatting of this note might be different from the original. Formatting of this note might be different from the original. Overview:  Qualifier: Diagnosis of  By: Gerlean LPN, Mliss   Last Assessment & Plan:  Avoid offending foods, start probiotics. Do not eat large meals in late evening and consider raising head of bed. Last Assessment   GERD (gastroesophageal reflux disease)    H. pylori infection 10/19/2013   H/O  multiple allergies 07/14/2018   Helicobacter pylori (H. pylori) as the cause of diseases classified elsewhere 10/19/2013   Formatting of this note might be different from the original. Last Assessment & Plan:  Tetracycline , Pepto Bismol, Flagyl  and Omeprazole .   Hepatitis 1970s   don't know for sure which one it was; think it was A (07/18/2018)   History of food allergy  08/14/2018   History of pulmonary embolism 07/17/2018   Hoarseness 03/09/2019   Hypercalcemia 03/09/2019   Hyperglycemia 03/14/2016   Hyperlipidemia, mixed 05/26/2013   Crestor caused  N/V Did not tolerate Lipitor    Knee pain, bilateral 12/17/2012   Left hip pain 05/23/2015   Low back pain 09/19/2015   Lower urinary tract infectious disease 10/19/2013   Lymphadenopathy 10/18/2017   Macular degeneration of both eyes 12/16/2015   Right eye is wet Left Eye is dry Shots every 11 to 12 weeks in right eye at opthamologist office   Medicare annual wellness visit, subsequent 02/21/2015   Mixed anxiety depressive disorder 12/14/2008   Formatting of this note might be different from the original. Overview:  Qualifier: Diagnosis of  By: Gerlean LPN, Mliss  Patient had increased  Tremors on Bupropion   Last Assessment & Plan:  Is struggling with the stress of her 84 year old granddaughter whom she raised moving back with her own mother, the patient is estranged from her daughter so she is very upset. Increase Citalopram  40 mg daily    Mumps 78 yrs old   Neck pain 04/22/2017   Numbness of finger 12/17/2012   Occlusion and stenosis of carotid artery without mention of cerebral infarction 12/17/2012   OSA (obstructive sleep apnea) 12/14/2008   PSG  06/2013-AHI 6/h 08/2011 (bethany) AHI 4/h No longer needs CPAP    Preventative health care 03/14/2016   Pulmonary emboli (HCC) 05/26/2013   Recurrent infection of skin 07/19/2021   Rheumatoid factor positive 09/27/2021   Right shoulder pain 10/18/2017   Sensation of foreign body in larynx 04/08/2019   Skin lesion of back 03/27/2021   Sleep apnea    don't use CPAP anymore; dr said I didn't have to (07/18/2018)   Static tremor 11/05/2013   Formatting of this note might be different from the original. Last Assessment & Plan:  Follows with [facility]   Statin intolerance 09/29/2019   Stroke [facility])    been told I've had some strokes; didn't know I'd had them (07/18/2018)   Syncope 06/11/2023   Thyroid  disease    Vitamin D  deficiency 03/03/2018   Vitreomacular adhesion of both eyes 01/07/2019   Formatting of this note might be different  from the original. no traction    Past Surgical History:  Procedure Laterality Date   APPENDECTOMY  1984   CARDIAC CATHETERIZATION  ~ 2006   CAROTID ANGIOGRAPHY Left 08/20/2018   Procedure: CAROTID ANGIOGRAPHY;  Surgeon: Serene Gaile ORN, MD;  Location: [facility] INVASIVE [facility];  Service: Cardiovascular;  Laterality: Left;   CAROTID ENDARTERECTOMY Right 05/10/2007   cea   CATARACT EXTRACTION Left    CATARACT EXTRACTION W/ INTRAOCULAR LENS  IMPLANT, BILATERAL Bilateral    COLONOSCOPY  09/25/2008   normal   COLONOSCOPY WITH PROPOFOL   02/13/2022   Lynnie Bring at [facility]   CORONARY ANGIOPLASTY WITH STENT PLACEMENT  07/18/2018   CORONARY PRESSURE/FFR STUDY N/A 07/18/2018   Procedure: INTRAVASCULAR PRESSURE WIRE/FFR STUDY;  Surgeon: Court Dorn PARAS, MD;  Location: [facility];  Service: Cardiovascular;  Laterality: N/A;   CORONARY STENT  INTERVENTION N/A 07/18/2018   Procedure: CORONARY STENT INTERVENTION;  Surgeon: Court Dorn PARAS, MD;  Location: [facility];  Service: Cardiovascular;  Laterality: N/A;   JOINT REPLACEMENT     LEFT HEART CATH AND CORONARY ANGIOGRAPHY N/A 07/18/2018   Procedure: LEFT HEART CATH AND CORONARY ANGIOGRAPHY;  Surgeon: Court Dorn PARAS, MD;  Location: [facility];  Service: Cardiovascular;  Laterality: N/A;   MULTIPLE TOOTH EXTRACTIONS     TOTAL KNEE ARTHROPLASTY Bilateral 2005 - 2011   right - left   VAGINAL HYSTERECTOMY  1984   WISDOM TOOTH EXTRACTION      Family History  Problem Relation Age of Onset   Stroke Mother        mini stroke   Kidney disease Mother    Heart failure Mother    Hypertension Mother    Diabetes Sister        type 2   Kidney disease Sister    Allergic rhinitis Sister    Osteoarthritis Sister    Hyperlipidemia Sister    Heart attack Maternal Grandfather    Colon cancer Neg Hx    Stomach cancer Neg Hx    Rectal cancer Neg Hx     Social History   Socioeconomic History   Marital status: Divorced    Spouse  name: Not on file   Number of children: 2   Years of education: Not on file   Highest education level: 8th grade  Occupational History   Not on file  Tobacco Use   Smoking status: Former    Current packs/day: 0.00    Average packs/day: 2.0 packs/day for 30.0 years (60.0 ttl pk-yrs)    Types: Cigarettes    Start date: 12/02/1964    Quit date: 12/03/1994    Years since quitting: 29.1   Smokeless tobacco: Never   Tobacco comments:    Quit smoking 26 years ago  Vaping Use   Vaping status: Never Used  Substance and Sexual Activity   Alcohol use: Yes    Comment: special occasions   Drug use: Never   Sexual activity: Not Currently    Birth control/protection: Post-menopausal    Comment: lives with son and friend, no dietary restrictions  Other Topics Concern   Not on file  Social History Narrative   Right handed   No caffeine   One story home   Social Drivers of Health   Financial Resource Strain: Low Risk  (01/25/2024)   Overall Financial Resource Strain (CARDIA)    Difficulty of Paying Living Expenses: Not hard at all  Food Insecurity: No Food Insecurity (01/25/2024)   Hunger Vital Sign    Worried About Running Out of Food in the Last Year: Never true    Ran Out of Food in the Last Year: Never true  Transportation Needs: No Transportation Needs (01/25/2024)   PRAPARE - Administrator, Civil Service (Medical): No    Lack of Transportation (Non-Medical): No  Physical Activity: Sufficiently Active (01/25/2024)   Exercise Vital Sign    Days of Exercise per Week: 7 days    Minutes of Exercise per Session: 30 min  Stress: No Stress Concern Present (01/25/2024)   [employer] of Occupational Health - Occupational Stress Questionnaire    Feeling of Stress: Not at all  Social Connections: Socially Isolated (01/25/2024)   Social Connection and Isolation Panel    Frequency of Communication with Friends and Family: More than three times a week  Frequency of Social  Gatherings with Friends and Family: More than three times a week    Attends Religious Services: Never    Database administrator or Organizations: No    Attends Engineer, structural: Not on file    Marital Status: Divorced  Intimate Partner Violence: Not At Risk (05/02/2023)   Humiliation, Afraid, Rape, and Kick questionnaire    Fear of Current or Ex-Partner: No    Emotionally Abused: No    Physically Abused: No    Sexually Abused: No    Outpatient Medications Prior to Visit  Medication Sig Dispense Refill   acetaminophen  (TYLENOL ) 500 MG tablet Take 500 mg by mouth as needed for headache.     albuterol  (VENTOLIN  HFA) 108 (90 Base) MCG/ACT inhaler Inhale 2 puffs into the lungs every 6 (six) hours as needed for wheezing or shortness of breath. 18 g 3   aspirin  EC 81 MG tablet Take 1 tablet (81 mg total) by mouth daily. 90 tablet 3   atorvastatin  (LIPITOR) 10 MG tablet Take 1 tablet (10 mg total) by mouth daily. 90 tablet 1   busPIRone  (BUSPAR ) 5 MG tablet TAKE 1 TABLET BY MOUTH THREE TIMES A DAY 270 tablet 1   Calcium  Citrate-Vitamin D  200-250 MG-UNIT TABS Take 1 tablet by mouth daily.     Cholecalciferol 25 MCG (1000 UT) tablet Take 1,000 Units by mouth daily.     Choline Fenofibrate  (FENOFIBRIC ACID ) 135 MG CPDR Take 1 tablet by mouth daily. 90 capsule 1   clonazePAM  (KLONOPIN ) 1 MG tablet TAKE 1 TABLET BY MOUTH 3 TIMES DAILY AS NEEDED FOR ANXIETY. 90 tablet 0   clonazePAM  (KLONOPIN ) 1 MG tablet Take 1 tablet (1 mg total) by mouth 3 (three) times daily as needed for anxiety. 90 tablet 0   cyanocobalamin  100 MCG tablet Take 500 mcg by mouth daily.     esomeprazole  (NEXIUM ) 40 MG capsule TAKE 1 CAPSULE (40 MG TOTAL) BY MOUTH DAILY. 90 capsule 1   ezetimibe  (ZETIA ) 10 MG tablet TAKE 1 TABLET BY MOUTH EVERY DAY 90 tablet 1   gabapentin  (NEURONTIN ) 100 MG capsule TAKE 2 CAPSULES BY MOUTH AT BEDTIME 180 capsule 1   levothyroxine  (SYNTHROID ) 100 MCG tablet TAKE 1 TABLET BY MOUTH EVERY  DAY 90 tablet 2   metoprolol  succinate (TOPROL -XL) 25 MG 24 hr tablet Take 1 tablet (25 mg total) by mouth daily. 30 tablet 3   montelukast  (SINGULAIR ) 10 MG tablet TAKE 1 TABLET BY MOUTH AT BEDTIME AS NEEDED. 90 tablet 1   NON FORMULARY Take 1 tablet by mouth daily. Digestive advantage.     Omega-3 Fatty Acids (FISH OIL PO) Take 1 capsule by mouth daily.     primidone  (MYSOLINE ) 50 MG tablet TAKE 1 TABLET BY MOUTH TWICE A DAY 180 tablet 0   Probiotic Product (VSL#3) CAPS Take 1 capsule by mouth daily.     BREZTRI  AEROSPHERE 160-9-4.8 MCG/ACT AERO INHALE 2 PUFFS TWICE DAILY IN MORNING AND IN THE EVENING (Patient not taking: Reported on 01/28/2024) 10.7 each 4   cyclobenzaprine  (FLEXERIL ) 10 MG tablet Take 1 tablet (10 mg total) by mouth at bedtime as needed. (Patient not taking: Reported on 01/28/2024) 30 tablet 1   doxycycline  (VIBRA -TABS) 100 MG tablet Take 1 tablet (100 mg total) by mouth 2 (two) times daily. (Patient not taking: Reported on 01/28/2024) 20 tablet 0   FLUoxetine  (PROZAC ) 20 MG tablet TAKE 1 TABLET BY MOUTH EVERY DAY (Patient not taking: Reported on 01/28/2024)  90 tablet 1   methylPREDNISolone  (MEDROL ) 4 MG tablet 6 tabs po x 1 1 day then 5 tabs po x 1 day then 4 tabs po x 1 day then 3 tabs po x 1 day then 2 tabs po x 1 day then 1 tab po x 1 day and stop (Patient not taking: Reported on 01/14/2024) 21 tablet 0   No facility-administered medications prior to visit.    Allergies  Allergen Reactions   Niaspan [Niacin Er (Antihyperlipidemic)] Nausea And Vomiting and Swelling    Swelling in mouth   Pantoprazole      Mouth sores   Rosuvastatin Calcium  Anaphylaxis   Sulfonamide Derivatives Hives   Bupropion  Other (See Comments)    Uncontrollable shakes   Nitroglycerin      NOT allergic - cardiology has recommended she NOT use this due to h/o severe carotid disease as it may decrease cerebral perfusion   Penicillins Hives    DID THE REACTION INVOLVE: Swelling of the  face/tongue/throat, SOB, or low BP? Yes Sudden or severe rash/hives, skin peeling, or the inside of the mouth or nose? Unknown Did it require medical treatment? Unknown When did it last happen?   [age]  If all above answers are "NO", may proceed with cephalosporin use.   Statins    Apple Rash   Apple Juice Rash   Banana Rash    Review of Systems  Constitutional:  Negative for fever and malaise/fatigue.  HENT:  Negative for congestion.   Eyes:  Negative for blurred vision.  Respiratory:  Negative for shortness of breath.   Cardiovascular:  Negative for chest pain, palpitations and leg swelling.  Gastrointestinal:  Negative for abdominal pain, blood in stool and nausea.  Genitourinary:  Negative for dysuria and frequency.  Musculoskeletal:  Negative for falls.  Skin:  Negative for rash.  Neurological:  Negative for dizziness, loss of consciousness and headaches.  Endo/Heme/Allergies:  Negative for environmental allergies.  Psychiatric/Behavioral:  Positive for memory loss. Negative for depression. The patient is not nervous/anxious.        Objective:    Physical Exam Constitutional:      General: She is not in acute distress.    Appearance: Normal appearance. She is well-developed. She is not toxic-appearing.  HENT:     Head: Normocephalic and atraumatic.     Right Ear: External ear normal.     Left Ear: External ear normal.     Nose: Nose normal.   Eyes:     General:        Right eye: No discharge.        Left eye: No discharge.     Conjunctiva/sclera: Conjunctivae normal.   Neck:     Thyroid : No thyromegaly.   Cardiovascular:     Rate and Rhythm: Normal rate and regular rhythm.     Heart sounds: Normal heart sounds. No murmur heard. Pulmonary:     Effort: Pulmonary effort is normal. No respiratory distress.     Breath sounds: Normal breath sounds.  Abdominal:     General: Bowel sounds are normal.     Palpations: Abdomen is soft.     Tenderness: There is no  abdominal tenderness. There is no guarding.   Musculoskeletal:        General: Normal range of motion.     Cervical back: Neck supple.  Lymphadenopathy:     Cervical: No cervical adenopathy.   Skin:    General: Skin is warm and dry.   Neurological:  Mental Status: She is alert and oriented to person, place, and time.   Psychiatric:        Mood and Affect: Mood normal.        Behavior: Behavior normal.        Thought Content: Thought content normal.        Judgment: Judgment normal.     BP 132/84   Pulse 60   Resp 16   Ht 5' 2 (1.575 m)   Wt 176 lb (79.8 kg)   SpO2 99%   BMI 32.19 kg/m  Wt Readings from Last 3 Encounters:  01/28/24 176 lb (79.8 kg)  01/14/24 176 lb 9.6 oz (80.1 kg)  11/08/23 180 lb 4.8 oz (81.8 kg)    Diabetic Foot Exam - Simple   No data filed    Lab Results  Component Value Date   WBC 6.3 11/08/2023   HGB 13.1 11/08/2023   HCT 39.9 11/08/2023   PLT 315.0 11/08/2023   GLUCOSE 84 11/08/2023   CHOL 140 11/08/2023   TRIG 249.0 (H) 11/08/2023   HDL 41.40 11/08/2023   LDLDIRECT 103.0 07/14/2019   LDLCALC 49 11/08/2023   ALT 11 11/08/2023   AST 16 11/08/2023   NA 143 11/08/2023   K 4.3 11/08/2023   CL 106 11/08/2023   CREATININE 1.22 (H) 11/08/2023   BUN 24 (H) 11/08/2023   CO2 29 11/08/2023   TSH 0.22 (L) 11/08/2023   INR 1.79 07/18/2018   HGBA1C 5.3 11/08/2023    Lab Results  Component Value Date   TSH 0.22 (L) 11/08/2023   Lab Results  Component Value Date   WBC 6.3 11/08/2023   HGB 13.1 11/08/2023   HCT 39.9 11/08/2023   MCV 92.9 11/08/2023   PLT 315.0 11/08/2023   Lab Results  Component Value Date   NA 143 11/08/2023   K 4.3 11/08/2023   CO2 29 11/08/2023   GLUCOSE 84 11/08/2023   BUN 24 (H) 11/08/2023   CREATININE 1.22 (H) 11/08/2023   BILITOT 0.4 11/08/2023   ALKPHOS 63 11/08/2023   AST 16 11/08/2023   ALT 11 11/08/2023   PROT 6.7 11/08/2023   ALBUMIN 4.2 11/08/2023   CALCIUM  9.5 11/08/2023   ANIONGAP 13  02/15/2021   EGFR 40.0 05/16/2023   GFR 43.36 (L) 11/08/2023   Lab Results  Component Value Date   CHOL 140 11/08/2023   Lab Results  Component Value Date   HDL 41.40 11/08/2023   Lab Results  Component Value Date   LDLCALC 49 11/08/2023   Lab Results  Component Value Date   TRIG 249.0 (H) 11/08/2023   Lab Results  Component Value Date   CHOLHDL 3 11/08/2023   Lab Results  Component Value Date   HGBA1C 5.3 11/08/2023       Assessment & Plan:  CKD (chronic kidney disease), stage IV (HCC) Assessment & Plan: Hydrate and monitor.    Orders: -     COMPLETE METABOLIC PANEL WITHOUT GFR  Essential hypertension Assessment & Plan: Well controlled, no changes to meds. Encouraged heart healthy diet such as the DASH diet and exercise as tolerated.   Orders: -     TSH -     T4, free -     T3, free -     COMPLETE METABOLIC PANEL WITHOUT GFR  Hyperglycemia Assessment & Plan: hgba1c acceptable, minimize simple carbs. Increase exercise as tolerated.    Hyperlipidemia, mixed Assessment & Plan: Tolerating statin, encouraged heart healthy diet, avoid trans  fats, minimize simple carbs and saturated fats. Increase exercise as tolerated   Statin intolerance Assessment & Plan: Intolerant of statins   Vitamin D  deficiency Assessment & Plan: Supplement and monitor   Thyroid  disease Assessment & Plan: On Levothyroxine , continue to monitor    Abnormal TSH -     TSH -     T4, free -     T3, free -     COMPLETE METABOLIC PANEL WITHOUT GFR  Chronic obstructive pulmonary disease with acute exacerbation (HCC) Assessment & Plan: No recent exacerbation   Mild cognitive impairment Assessment & Plan: She is now being cared for at neurology and is doing better at home. Her daughter has moved back to the area and is helping patient in coordination with patient's son as well. They are having her driving evaluated and she is taking a class so they can decide if she can  drive locally.     Harlene Horton, MD

## 2024-01-28 NOTE — Assessment & Plan Note (Signed)
 No recent exacerbation

## 2024-01-28 NOTE — Assessment & Plan Note (Signed)
 She is now being cared for at neurology and is doing better at home. Her daughter has moved back to the area and is helping patient in coordination with patient's son as well. They are having her driving evaluated and she is taking a class so they can decide if she can drive locally.

## 2024-01-29 ENCOUNTER — Ambulatory Visit: Payer: Self-pay | Admitting: Family Medicine

## 2024-01-29 LAB — COMPLETE METABOLIC PANEL WITHOUT GFR
AG Ratio: 1.7 (calc) (ref 1.0–2.5)
ALT: 16 U/L (ref 6–29)
AST: 20 U/L (ref 10–35)
Albumin: 4.1 g/dL (ref 3.6–5.1)
Alkaline phosphatase (APISO): 57 U/L (ref 37–153)
BUN/Creatinine Ratio: 20 (calc) (ref 6–22)
BUN: 25 mg/dL (ref 7–25)
CO2: 27 mmol/L (ref 20–32)
Calcium: 9.7 mg/dL (ref 8.6–10.4)
Chloride: 105 mmol/L (ref 98–110)
Creat: 1.25 mg/dL — ABNORMAL HIGH (ref 0.60–1.00)
Globulin: 2.4 g/dL (ref 1.9–3.7)
Glucose, Bld: 74 mg/dL (ref 65–99)
Potassium: 4.8 mmol/L (ref 3.5–5.3)
Sodium: 141 mmol/L (ref 135–146)
Total Bilirubin: 0.5 mg/dL (ref 0.2–1.2)
Total Protein: 6.5 g/dL (ref 6.1–8.1)

## 2024-01-29 NOTE — Progress Notes (Signed)
Patient reviewed via MyChart.

## 2024-01-31 ENCOUNTER — Ambulatory Visit (HOSPITAL_BASED_OUTPATIENT_CLINIC_OR_DEPARTMENT_OTHER): Admitting: Pulmonary Disease

## 2024-02-03 ENCOUNTER — Other Ambulatory Visit: Payer: Self-pay | Admitting: Family Medicine

## 2024-02-04 ENCOUNTER — Ambulatory Visit: Payer: Medicare Other | Admitting: Neurology

## 2024-02-06 NOTE — Progress Notes (Signed)
 Assessment/Plan:    1.  Essential Tremor  -DaTscan  done September, 2024 was normal  -happy with primidone  50 mg bid.  Refills provided today  2.  MCI  - Neurocognitive testing done by Dr. Gayland in June, 2025 (ordered by pcp) with evidence of only MCI.  Subjective:   Emily Mitchell was seen today in follow up for essential tremor.  Son with patient who supplements hx.   My previous records were reviewed prior to todays visit.  Patient remains on primidone , 50 mg twice per day.  She thinks that she is doing well in terms of tremor control.  You can read my writing now.  Since our last visit, she was seen by her primary care who ordered neurocognitive testing.  That was done at our office and just demonstrated mild cognitive impairment.  Current prescribed movement disorder medications: Primidone , 50 mg twice daily  Current/Previously tried tremor medications: On clonazepam  1 mg daily; gabapentin  100 mg twice daily; prior carvedilol  (not currently)   Current medications that may exacerbate tremor:  Albuterol  (she uses it daily and not sure if worsens); Singulair ; breztri   ALLERGIES:   Allergies  Allergen Reactions   Niaspan [Niacin Er (Antihyperlipidemic)] Nausea And Vomiting and Swelling    Swelling in mouth   Pantoprazole      Mouth sores   Rosuvastatin Calcium  Anaphylaxis   Sulfonamide Derivatives Hives   Bupropion  Other (See Comments)    Uncontrollable shakes   Nitroglycerin      NOT allergic - cardiology has recommended she NOT use this due to h/o severe carotid disease as it may decrease cerebral perfusion   Penicillins Hives    DID THE REACTION INVOLVE: Swelling of the face/tongue/throat, SOB, or low BP? Yes Sudden or severe rash/hives, skin peeling, or the inside of the mouth or nose? Unknown Did it require medical treatment? Unknown When did it last happen?   [age]  If all above answers are "NO", may proceed with cephalosporin use.   Statins    Apple Rash    Apple Juice Rash   Banana Rash    CURRENT MEDICATIONS:  Current Meds  Medication Sig   acetaminophen  (TYLENOL ) 500 MG tablet Take 500 mg by mouth as needed for headache.   albuterol  (VENTOLIN  HFA) 108 (90 Base) MCG/ACT inhaler Inhale 2 puffs into the lungs every 6 (six) hours as needed for wheezing or shortness of breath.   aspirin  EC 81 MG tablet Take 1 tablet (81 mg total) by mouth daily.   atorvastatin  (LIPITOR) 10 MG tablet Take 1 tablet (10 mg total) by mouth daily.   BREZTRI  AEROSPHERE 160-9-4.8 MCG/ACT AERO INHALE 2 PUFFS TWICE DAILY IN MORNING AND IN THE EVENING   busPIRone  (BUSPAR ) 5 MG tablet TAKE 1 TABLET BY MOUTH THREE TIMES A DAY   Calcium  Citrate-Vitamin D  200-250 MG-UNIT TABS Take 1 tablet by mouth daily.   Cholecalciferol 25 MCG (1000 UT) tablet Take 1,000 Units by mouth daily.   Choline Fenofibrate  (FENOFIBRIC ACID ) 135 MG CPDR Take 1 tablet by mouth daily.   clonazePAM  (KLONOPIN ) 1 MG tablet TAKE 1 TABLET BY MOUTH 3 TIMES DAILY AS NEEDED FOR ANXIETY.   clonazePAM  (KLONOPIN ) 1 MG tablet Take 1 tablet (1 mg total) by mouth 3 (three) times daily as needed for anxiety.   cyanocobalamin  100 MCG tablet Take 500 mcg by mouth daily.   esomeprazole  (NEXIUM ) 40 MG capsule TAKE 1 CAPSULE (40 MG TOTAL) BY MOUTH DAILY.   ezetimibe  (ZETIA ) 10 MG tablet TAKE  1 TABLET BY MOUTH EVERY DAY   gabapentin  (NEURONTIN ) 100 MG capsule TAKE 2 CAPSULES BY MOUTH AT BEDTIME   levothyroxine  (SYNTHROID ) 100 MCG tablet TAKE 1 TABLET BY MOUTH EVERY DAY   metoprolol  succinate (TOPROL -XL) 25 MG 24 hr tablet TAKE 1 TABLET (25 MG TOTAL) BY MOUTH DAILY.   montelukast  (SINGULAIR ) 10 MG tablet TAKE 1 TABLET BY MOUTH AT BEDTIME AS NEEDED.   NON FORMULARY Take 1 tablet by mouth daily. Digestive advantage.   Omega-3 Fatty Acids (FISH OIL PO) Take 1 capsule by mouth daily.   Probiotic Product (VSL#3) CAPS Take 1 capsule by mouth daily.   [DISCONTINUED] primidone  (MYSOLINE ) 50 MG tablet TAKE 1 TABLET BY MOUTH TWICE  A DAY      Objective:    PHYSICAL EXAMINATION:    VITALS:   Vitals:   02/08/24 0938  BP: (!) 172/80  Pulse: 62  SpO2: 96%     Gen:  Appears stated age and in NAD. HEENT:  Normocephalic, atraumatic. The mucous membranes are moist. The superficial temporal arteries are without ropiness or tenderness.    NEUROLOGICAL:   Orientation:  The patient is alert and oriented x 3.   Cranial nerves: There is good facial symmetry. Extraocular muscles are intact and visual fields are full to confrontational testing. Speech is fluent and clear. Soft palate rises symmetrically and there is no tongue deviation. Hearing is intact to conversational tone. Tone: min increased tone in the RUE Sensation: Sensation is intact to light touch touch throughout  Coordination:  The patient has no dysdiadichokinesia or dysmetria. Motor: Strength is 5/5 in the bilateral upper and lower extremities.   Gait and Station: doesn't feel comfortable today.     MOVEMENT EXAM: Tremor:    there is RUE rest tremor, rare.  There is jaw tremor.  There is mild postural tremor, R>L.    I have reviewed and interpreted the following labs independently   Chemistry      Component Value Date/Time   NA 141 01/28/2024 1249   NA 140 10/31/2021 0000   K 4.8 01/28/2024 1249   CL 105 01/28/2024 1249   CO2 27 01/28/2024 1249   BUN 25 01/28/2024 1249   BUN 28 (A) 10/31/2021 0000   CREATININE 1.25 (H) 01/28/2024 1249   GLU 87 10/31/2021 0000      Component Value Date/Time   CALCIUM  9.7 01/28/2024 1249   CALCIUM  9.5 11/25/2009 1346   ALKPHOS 63 11/08/2023 1539   AST 20 01/28/2024 1249   ALT 16 01/28/2024 1249   BILITOT 0.5 01/28/2024 1249   BILITOT 0.4 04/12/2020 1137      Lab Results  Component Value Date   WBC 6.3 11/08/2023   HGB 13.1 11/08/2023   HCT 39.9 11/08/2023   MCV 92.9 11/08/2023   PLT 315.0 11/08/2023   Lab Results  Component Value Date   TSH 2.26 01/28/2024     Chemistry      Component  Value Date/Time   NA 141 01/28/2024 1249   NA 140 10/31/2021 0000   K 4.8 01/28/2024 1249   CL 105 01/28/2024 1249   CO2 27 01/28/2024 1249   BUN 25 01/28/2024 1249   BUN 28 (A) 10/31/2021 0000   CREATININE 1.25 (H) 01/28/2024 1249   GLU 87 10/31/2021 0000      Component Value Date/Time   CALCIUM  9.7 01/28/2024 1249   CALCIUM  9.5 11/25/2009 1346   ALKPHOS 63 11/08/2023 1539   AST 20 01/28/2024 1249  ALT 16 01/28/2024 1249   BILITOT 0.5 01/28/2024 1249   BILITOT 0.4 04/12/2020 1137       Cc:  Domenica Harlene LABOR, MD

## 2024-02-08 ENCOUNTER — Ambulatory Visit: Admitting: Neurology

## 2024-02-08 ENCOUNTER — Encounter: Payer: Self-pay | Admitting: Neurology

## 2024-02-08 VITALS — BP 172/80 | HR 62

## 2024-02-08 DIAGNOSIS — G3184 Mild cognitive impairment, so stated: Secondary | ICD-10-CM | POA: Diagnosis not present

## 2024-02-08 DIAGNOSIS — G250 Essential tremor: Secondary | ICD-10-CM | POA: Diagnosis not present

## 2024-02-08 MED ORDER — PRIMIDONE 50 MG PO TABS: 50.0000 mg | ORAL_TABLET | Freq: Two times a day (BID) | ORAL | 2 refills | Status: AC

## 2024-02-12 ENCOUNTER — Other Ambulatory Visit

## 2024-02-14 ENCOUNTER — Ambulatory Visit: Admitting: Physician Assistant

## 2024-02-14 ENCOUNTER — Encounter: Payer: Self-pay | Admitting: Physician Assistant

## 2024-02-14 VITALS — BP 172/74 | HR 64 | Temp 98.4°F | Ht 62.0 in | Wt 174.0 lb

## 2024-02-14 DIAGNOSIS — R051 Acute cough: Secondary | ICD-10-CM

## 2024-02-14 MED ORDER — AZITHROMYCIN 250 MG PO TABS: ORAL_TABLET | ORAL | 0 refills | Status: AC

## 2024-02-14 MED ORDER — BENZONATATE 100 MG PO CAPS: 100.0000 mg | ORAL_CAPSULE | Freq: Two times a day (BID) | ORAL | 0 refills | Status: DC | PRN

## 2024-02-14 NOTE — Progress Notes (Unsigned)
 Established patient visit   Patient: Emily Mitchell   DOB: [birth year]   78 y.o. Female  MRN: [id number] Visit Date: 02/14/2024  Today's healthcare provider: Manuelita Flatness, PA-C   No chief complaint on file.  Subjective       Medications: Outpatient Medications Prior to Visit  Medication Sig   acetaminophen  (TYLENOL ) 500 MG tablet Take 500 mg by mouth as needed for headache.   albuterol  (VENTOLIN  HFA) 108 (90 Base) MCG/ACT inhaler Inhale 2 puffs into the lungs every 6 (six) hours as needed for wheezing or shortness of breath.   aspirin  EC 81 MG tablet Take 1 tablet (81 mg total) by mouth daily.   atorvastatin  (LIPITOR) 10 MG tablet Take 1 tablet (10 mg total) by mouth daily.   BREZTRI  AEROSPHERE 160-9-4.8 MCG/ACT AERO INHALE 2 PUFFS TWICE DAILY IN MORNING AND IN THE EVENING   busPIRone  (BUSPAR ) 5 MG tablet TAKE 1 TABLET BY MOUTH THREE TIMES A DAY   Calcium  Citrate-Vitamin D  200-250 MG-UNIT TABS Take 1 tablet by mouth daily.   Cholecalciferol 25 MCG (1000 UT) tablet Take 1,000 Units by mouth daily.   Choline Fenofibrate  (FENOFIBRIC ACID ) 135 MG CPDR Take 1 tablet by mouth daily.   clonazePAM  (KLONOPIN ) 1 MG tablet TAKE 1 TABLET BY MOUTH 3 TIMES DAILY AS NEEDED FOR ANXIETY.   clonazePAM  (KLONOPIN ) 1 MG tablet Take 1 tablet (1 mg total) by mouth 3 (three) times daily as needed for anxiety.   cyanocobalamin  100 MCG tablet Take 500 mcg by mouth daily.   esomeprazole  (NEXIUM ) 40 MG capsule TAKE 1 CAPSULE (40 MG TOTAL) BY MOUTH DAILY.   ezetimibe  (ZETIA ) 10 MG tablet TAKE 1 TABLET BY MOUTH EVERY DAY   gabapentin  (NEURONTIN ) 100 MG capsule TAKE 2 CAPSULES BY MOUTH AT BEDTIME   levothyroxine  (SYNTHROID ) 100 MCG tablet TAKE 1 TABLET BY MOUTH EVERY DAY   metoprolol  succinate (TOPROL -XL) 25 MG 24 hr tablet TAKE 1 TABLET (25 MG TOTAL) BY MOUTH DAILY.   montelukast  (SINGULAIR ) 10 MG tablet TAKE 1 TABLET BY MOUTH AT BEDTIME AS NEEDED.   NON FORMULARY Take 1 tablet by mouth daily.  Digestive advantage.   Omega-3 Fatty Acids (FISH OIL PO) Take 1 capsule by mouth daily.   primidone  (MYSOLINE ) 50 MG tablet Take 1 tablet (50 mg total) by mouth 2 (two) times daily.   Probiotic Product (VSL#3) CAPS Take 1 capsule by mouth daily.   No facility-administered medications prior to visit.    Review of Systems {Insert previous labs (optional):23779} {See past labs  Heme  Chem  Endocrine  Serology  Results Review (optional):1}   Objective    BP (!) 172/74   Pulse 64   Temp 98.4 F (36.9 C)   Ht 5' 2 (1.575 m)   Wt 174 lb (78.9 kg)   SpO2 96%   BMI 31.83 kg/m  {Insert last BP/Wt (optional):23777}{See vitals history (optional):1}  Physical Exam Constitutional:      General: She is awake.     Appearance: She is well-developed.  HENT:     Head: Normocephalic.  Eyes:     Conjunctiva/sclera: Conjunctivae normal.  Cardiovascular:     Rate and Rhythm: Normal rate and regular rhythm.     Heart sounds: Normal heart sounds.  Pulmonary:     Effort: Pulmonary effort is normal.     Breath sounds: Normal breath sounds. No wheezing, rhonchi or rales.  Skin:    General: Skin is warm.  Neurological:  Mental Status: She is alert and oriented to person, place, and time.  Psychiatric:        Attention and Perception: Attention normal.        Mood and Affect: Mood normal.        Speech: Speech normal.        Behavior: Behavior is cooperative.     ***  No results found for any visits on 02/14/24.  Assessment & Plan    There are no diagnoses linked to this encounter.  ***  No follow-ups on file.       Manuelita Flatness, PA-C  [facility] [phone] (phone) [phone] (fax)  [facility]

## 2024-02-15 ENCOUNTER — Encounter: Payer: Self-pay | Admitting: Physician Assistant

## 2024-02-19 ENCOUNTER — Ambulatory Visit (INDEPENDENT_AMBULATORY_CARE_PROVIDER_SITE_OTHER): Admitting: Adult Health

## 2024-02-19 ENCOUNTER — Ambulatory Visit

## 2024-02-19 ENCOUNTER — Encounter: Payer: Self-pay | Admitting: Adult Health

## 2024-02-19 ENCOUNTER — Ambulatory Visit: Payer: Self-pay | Admitting: Adult Health

## 2024-02-19 VITALS — BP 140/84 | HR 66 | Wt 177.2 lb

## 2024-02-19 DIAGNOSIS — I672 Cerebral atherosclerosis: Secondary | ICD-10-CM | POA: Diagnosis not present

## 2024-02-19 DIAGNOSIS — R059 Cough, unspecified: Secondary | ICD-10-CM | POA: Diagnosis not present

## 2024-02-19 DIAGNOSIS — J449 Chronic obstructive pulmonary disease, unspecified: Secondary | ICD-10-CM | POA: Diagnosis not present

## 2024-02-19 DIAGNOSIS — J441 Chronic obstructive pulmonary disease with (acute) exacerbation: Secondary | ICD-10-CM | POA: Diagnosis not present

## 2024-02-19 MED ORDER — PREDNISONE 20 MG PO TABS: 20.0000 mg | ORAL_TABLET | Freq: Every day | ORAL | 0 refills | Status: DC

## 2024-02-19 MED ORDER — DOXYCYCLINE HYCLATE 100 MG PO TABS: 100.0000 mg | ORAL_TABLET | Freq: Two times a day (BID) | ORAL | 0 refills | Status: DC

## 2024-02-19 MED ORDER — TRELEGY ELLIPTA 100-62.5-25 MCG/ACT IN AEPB: 1.0000 | INHALATION_SPRAY | Freq: Every day | RESPIRATORY_TRACT | 5 refills | Status: DC

## 2024-02-19 MED ORDER — ALBUTEROL SULFATE (2.5 MG/3ML) 0.083% IN NEBU
2.5000 mg | INHALATION_SOLUTION | Freq: Once | RESPIRATORY_TRACT | Status: AC
  Administered 2024-02-19: 2.5 mg via RESPIRATORY_TRACT

## 2024-02-19 MED ORDER — TRELEGY ELLIPTA 100-62.5-25 MCG/ACT IN AEPB: 1.0000 | INHALATION_SPRAY | Freq: Every day | RESPIRATORY_TRACT | Status: DC

## 2024-02-19 NOTE — Progress Notes (Signed)
 @Patient  ID: Emily Mitchell, female    DOB: 10-Nov-1945, 78 y.o.   MRN: [id number]  No chief complaint on file.   Referring provider: Domenica Harlene LABOR, MD  HPI: 78 yo female former smoker followed for COPD with Asthma Medical history significant for PE in 2014 on Coumadin  2014-2019  History of mild OSA previously on CPAP -stopped after weight loss  CKD stage IV   TEST/EVENTS :  PSG at [facility] 08/2011 -217 lbs -AHI 4/h, TST 319 mins,supine    PSG 07/2013 -208 lbs -showed mild OSA TST - 390 mins,  AHI  6/h, RDI of 8/h The lowest desaturation was 87%      Spirometry 04/2013: FEV1 72% predicted FEV1 FVC ratio 73 Spirometry  10/2017  severe airway obstruction with ratio 60, FEV1 of 41% and FVC of 52%    Echo 08/2014 grade 2 DD   12/2014 ONO - no desatn >> dc O2  PFTs today show stable lung function (improved since 2019) but FEV1 at 74%, ratio 77, FVC 72%, DLCO 78%   02/19/2024 Follow up : COPD with Asthma  Discussed the use of AI scribe software for clinical note transcription with the patient, who gave verbal consent to proceed.  History of Present Illness   Emily Mitchell is a 78 year old female with COPD who presents with persistent cough and congestion.  Three weeks ago, she developed a persistent cough and congestion. She was seen by PCP last week treated for bronchitis with a Z-Pak and cough medicine but she has not experienced significant improvement. She continues to feel congested and coughs up sputum, with intermittent fever persisting for at least two to three weeks. Still has wheezing and cough .   She has a history of COPD and was previously on Breztri , which she discontinued because it was causing sores or irritation inside her mouth. She prefers a once-daily medication regimen. She has been taking loratadine for presumed allergies  She has not seen her grandchildren in three weeks due to her illness and is eager to recover so she can spend time with them.     Appetite is  good with no nausea vomiting or diarrhea.  She denies any hemoptysis or chest pain.  No calf pain or swelling.      Allergies  Allergen Reactions   Niaspan [Niacin Er (Antihyperlipidemic)] Nausea And Vomiting and Swelling    Swelling in mouth   Pantoprazole      Mouth sores   Rosuvastatin Calcium  Anaphylaxis   Sulfonamide Derivatives Hives   Bupropion  Other (See Comments)    Uncontrollable shakes   Nitroglycerin      NOT allergic - cardiology has recommended she NOT use this due to h/o severe carotid disease as it may decrease cerebral perfusion   Penicillins Hives    DID THE REACTION INVOLVE: Swelling of the face/tongue/throat, SOB, or low BP? Yes Sudden or severe rash/hives, skin peeling, or the inside of the mouth or nose? Unknown Did it require medical treatment? Unknown When did it last happen?   [age]  If all above answers are "NO", may proceed with cephalosporin use.   Statins    Apple Rash   Apple Juice Rash   Banana Rash    Immunization History  Administered Date(s) Administered   Fluad Quad(high Dose 65+) 04/17/2019, 04/14/2020, 04/11/2022   Influenza Split 04/29/2013   Influenza, High Dose Seasonal PF 03/30/2016, 04/19/2017, 05/02/2018, 04/30/2023   Influenza,inj,Quad PF,6+ Mos 03/26/2014, 05/17/2015   Influenza-Unspecified 06/14/2021  PFIZER(Purple Top)SARS-COV-2 Vaccination 10/19/2019, 11/10/2019, 05/08/2020, 10/30/2020   Pfizer Covid-19 Vaccine Bivalent Booster 32yrs & up 05/18/2021   Pneumococcal Conjugate-13 04/29/2013   Pneumococcal Polysaccharide-23 04/30/2014, 04/17/2019   Respiratory Syncytial Virus Vaccine,Recomb Aduvanted(Arexvy) 06/14/2022   Tdap 05/26/2013   Zoster Recombinant(Shingrix) 04/27/2017, 06/13/2017   Zoster, Live 07/31/2012    Past Medical History:  Diagnosis Date   Acid reflux    Allergic rhinitis 05/19/2022   Anemia 07/05/2014   Anxiety and depression 12/14/2008   Qualifier: Diagnosis of  By: Gerlean LPN, Mliss     Arthritis     fingers, toes, knees, joints (07/18/2018)   Asthma    Atherosclerotic heart disease of native coronary artery without angina pectoris 07/19/2018   Atypical chest pain 05/23/2015   Back pain 02/02/2019   Breast mass, left 04/28/2019   CAD in native artery 07/19/2018   Candidal skin infection 02/28/2018   Carotid artery disease (HCC) 06/16/2015   S/p CEA    CHF (congestive heart failure) (HCC)    Chicken pox as a child   Chronic obstructive pulmonary disease (HCC) 12/14/2008   Formatting of this note might be different from the original. Overview:  Spirometry 05/12/2013: FEV1 72% predicted FEV1 FVC ratio 73 predicted FEF 25 75  43% predicted  Last Assessment & Plan:  OK to stop taking advair Use albuterol  as needed only Check oxygen  levels during sleep - we will call you with results Call if symptoms worse   Chronic rhinitis 06/14/2018   CKD (chronic kidney disease), stage IV (HCC)    Follows with [facility], Dr Fairy Shingles    Congestive heart failure (HCC) 11/30/2015   Formatting of this note might be different from the original. Last Assessment & Plan:  No recent exacerbation   Constipation 12/17/2008   Qualifier: Diagnosis of  By: Ezzard DEVONNA Sonny CANDIE    COPD with asthma (HCC) 11/06/2017   Dry eyes 11/30/2015   Essential hypertension 10/18/2015   Formatting of this note might be different from the original. Last Assessment & Plan:  poorlycontrolled and under a great deal of stress, add Metoprolol  XL 25 mg daily. Encouraged heart healthy diet such as the DASH diet and exercise as tolerated.   Essential tremor 11/05/2013   Exudative age-related macular degeneration of right eye with active choroidal neovascularization (HCC) 08/26/2015   Gastro-esophageal reflux disease without esophagitis 12/14/2008   Formatting of this note might be different from the original. Formatting of this note might be different from the original. Formatting of this note might be different from the  original. Overview:  Qualifier: Diagnosis of  By: Gerlean LPN, Mliss   Last Assessment & Plan:  Avoid offending foods, start probiotics. Do not eat large meals in late evening and consider raising head of bed. Last Assessment   GERD (gastroesophageal reflux disease)    H. pylori infection 10/19/2013   H/O multiple allergies 07/14/2018   Helicobacter pylori (H. pylori) as the cause of diseases classified elsewhere 10/19/2013   Formatting of this note might be different from the original. Last Assessment & Plan:  Tetracycline , Pepto Bismol, Flagyl  and Omeprazole .   Hepatitis 1970s   don't know for sure which one it was; think it was A (07/18/2018)   History of food allergy  08/14/2018   History of pulmonary embolism 07/17/2018   Hoarseness 03/09/2019   Hypercalcemia 03/09/2019   Hyperglycemia 03/14/2016   Hyperlipidemia, mixed 05/26/2013   Crestor caused N/V Did not tolerate Lipitor    Knee pain, bilateral 12/17/2012  Left hip pain 05/23/2015   Low back pain 09/19/2015   Lower urinary tract infectious disease 10/19/2013   Lymphadenopathy 10/18/2017   Macular degeneration of both eyes 12/16/2015   Right eye is wet Left Eye is dry Shots every 11 to 12 weeks in right eye at opthamologist office   Medicare annual wellness visit, subsequent 02/21/2015   Mixed anxiety depressive disorder 12/14/2008   Formatting of this note might be different from the original. Overview:  Qualifier: Diagnosis of  By: Gerlean LPN, Mliss  Patient had increased  Tremors on Bupropion   Last Assessment & Plan:  Is struggling with the stress of her 55 year old granddaughter whom she raised moving back with her own mother, the patient is estranged from her daughter so she is very upset. Increase Citalopram  40 mg daily    Mumps 78 yrs old   Neck pain 04/22/2017   Numbness of finger 12/17/2012   Occlusion and stenosis of carotid artery without mention of cerebral infarction 12/17/2012   OSA (obstructive sleep apnea)  12/14/2008   PSG  06/2013-AHI 6/h 08/2011 ([facility]) AHI 4/h No longer needs CPAP    Preventative health care 03/14/2016   Pulmonary emboli (HCC) 05/26/2013   Recurrent infection of skin 07/19/2021   Rheumatoid factor positive 09/27/2021   Right shoulder pain 10/18/2017   Sensation of foreign body in larynx 04/08/2019   Skin lesion of back 03/27/2021   Sleep apnea    don't use CPAP anymore; dr said I didn't have to (07/18/2018)   Static tremor 11/05/2013   Formatting of this note might be different from the original. Last Assessment & Plan:  Follows with [facility] neurology   Statin intolerance 09/29/2019   Stroke [facility])    been told I've had some strokes; didn't know I'd had them (07/18/2018)   Syncope 06/11/2023   Thyroid  disease    Vitamin D  deficiency 03/03/2018   Vitreomacular adhesion of both eyes 01/07/2019   Formatting of this note might be different from the original. no traction    Tobacco History: Social History   Tobacco Use  Smoking Status Former   Current packs/day: 0.00   Average packs/day: 2.0 packs/day for 30.0 years (60.0 ttl pk-yrs)   Types: Cigarettes   Start date: 12/02/1964   Quit date: 12/03/1994   Years since quitting: 29.2  Smokeless Tobacco Never  Tobacco Comments   Quit smoking 26 years ago   Counseling given: Not Answered Tobacco comments: Quit smoking 26 years ago   Outpatient Medications Prior to Visit  Medication Sig Dispense Refill   acetaminophen  (TYLENOL ) 500 MG tablet Take 500 mg by mouth as needed for headache.     albuterol  (VENTOLIN  HFA) 108 (90 Base) MCG/ACT inhaler Inhale 2 puffs into the lungs every 6 (six) hours as needed for wheezing or shortness of breath. 18 g 3   aspirin  EC 81 MG tablet Take 1 tablet (81 mg total) by mouth daily. 90 tablet 3   atorvastatin  (LIPITOR) 10 MG tablet Take 1 tablet (10 mg total) by mouth daily. 90 tablet 1   azithromycin  (ZITHROMAX ) 250 MG tablet Take 2 tablets on day 1, then 1 tablet daily on days 2  through 5 6 tablet 0   benzonatate  (TESSALON ) 100 MG capsule Take 1 capsule (100 mg total) by mouth 2 (two) times daily as needed for cough. 20 capsule 0   BREZTRI  AEROSPHERE 160-9-4.8 MCG/ACT AERO INHALE 2 PUFFS TWICE DAILY IN MORNING AND IN THE EVENING 10.7 each 4  busPIRone  (BUSPAR ) 5 MG tablet TAKE 1 TABLET BY MOUTH THREE TIMES A DAY 270 tablet 1   Calcium  Citrate-Vitamin D  200-250 MG-UNIT TABS Take 1 tablet by mouth daily.     Cholecalciferol 25 MCG (1000 UT) tablet Take 1,000 Units by mouth daily.     Choline Fenofibrate  (FENOFIBRIC ACID ) 135 MG CPDR Take 1 tablet by mouth daily. 90 capsule 1   clonazePAM  (KLONOPIN ) 1 MG tablet TAKE 1 TABLET BY MOUTH 3 TIMES DAILY AS NEEDED FOR ANXIETY. 90 tablet 0   clonazePAM  (KLONOPIN ) 1 MG tablet Take 1 tablet (1 mg total) by mouth 3 (three) times daily as needed for anxiety. 90 tablet 0   cyanocobalamin  100 MCG tablet Take 500 mcg by mouth daily.     esomeprazole  (NEXIUM ) 40 MG capsule TAKE 1 CAPSULE (40 MG TOTAL) BY MOUTH DAILY. 90 capsule 1   ezetimibe  (ZETIA ) 10 MG tablet TAKE 1 TABLET BY MOUTH EVERY DAY 90 tablet 1   gabapentin  (NEURONTIN ) 100 MG capsule TAKE 2 CAPSULES BY MOUTH AT BEDTIME 180 capsule 1   levothyroxine  (SYNTHROID ) 100 MCG tablet TAKE 1 TABLET BY MOUTH EVERY DAY 90 tablet 2   metoprolol  succinate (TOPROL -XL) 25 MG 24 hr tablet TAKE 1 TABLET (25 MG TOTAL) BY MOUTH DAILY. 90 tablet 1   montelukast  (SINGULAIR ) 10 MG tablet TAKE 1 TABLET BY MOUTH AT BEDTIME AS NEEDED. 90 tablet 1   NON FORMULARY Take 1 tablet by mouth daily. Digestive advantage.     Omega-3 Fatty Acids (FISH OIL PO) Take 1 capsule by mouth daily.     primidone  (MYSOLINE ) 50 MG tablet Take 1 tablet (50 mg total) by mouth 2 (two) times daily. 180 tablet 2   Probiotic Product (VSL#3) CAPS Take 1 capsule by mouth daily.     No facility-administered medications prior to visit.     Review of Systems:   Constitutional:   No  weight loss, night sweats,  Fevers, chills,  fatigue, or  lassitude.  HEENT:   No headaches,  Difficulty swallowing,  Tooth/dental problems, or  Sore throat,                No sneezing, itching, ear ache,+ nasal congestion, post nasal drip,   CV:  No chest pain,  Orthopnea, PND, swelling in lower extremities, anasarca, dizziness, palpitations, syncope.   GI  No heartburn, indigestion, abdominal pain, nausea, vomiting, diarrhea, change in bowel habits, loss of appetite, bloody stools.   Resp:   No wheezing.  No chest wall deformity  Skin: no rash or lesions.  GU: no dysuria, change in color of urine, no urgency or frequency.  No flank pain, no hematuria   MS:  No joint pain or swelling.  No decreased range of motion.  No back pain.    Physical Exam  BP (!) 140/84 (BP Location: Left Arm, Patient Position: Sitting, Cuff Size: Large)   Pulse 66   Wt 177 lb 3.2 oz (80.4 kg)   SpO2 94%   BMI 32.41 kg/m   GEN: A/Ox3; pleasant , NAD, well nourished    HEENT:  [place]/AT,   NOSE-clear, THROAT-clear, no lesions, no postnasal drip or exudate noted.   NECK:  Supple w/ fair ROM; no JVD; normal carotid impulses w/o bruits; no thyromegaly or nodules palpated; no lymphadenopathy.    RESP speaks in full sentences.,  Few expiratory wheezes.   no accessory muscle use, no dullness to percussion  CARD:  RRR, no m/r/g, no peripheral edema, pulses intact, no  cyanosis or clubbing.  GI:   Soft & nt; nml bowel sounds; no organomegaly or masses detected.   Musco: Warm bil, no deformities or joint swelling noted.   Neuro: alert, no focal deficits noted.    Skin: Warm, no lesions or rashes    Lab Results:  CBC   BMET    ProBNP   Imaging: No results found.  Administration History     None          Latest Ref Rng & Units 06/11/2023   12:43 PM  PFT Results  FVC-Pre L 1.95   FVC-Predicted Pre % 72   FVC-Post L 1.91   FVC-Predicted Post % 71   Pre FEV1/FVC % % 77   Post FEV1/FCV % % 77   FEV1-Pre L 1.50    FEV1-Predicted Pre % 74   FEV1-Post L 1.47   DLCO uncorrected ml/min/mmHg 14.58   DLCO UNC% % 78   DLCO corrected ml/min/mmHg 14.58   DLCO COR %Predicted % 78   DLVA Predicted % 101   TLC L 3.76   TLC % Predicted % 76   RV % Predicted % 72     No results found for: NITRICOXIDE      Assessment & Plan:   No problem-specific Assessment & Plan notes found for this encounter.  Assessment and Plan    COPD with acute exacerbation  -slow to resolve flare  She has been experiencing an acute exacerbation of COPD for 2-3 weeks, with symptoms of cough, congestion, and wheezing. Previous azithromycin  and cough medicine with minimal improvement. She has not used Breztri  due to oral mucosa irritation. Pneumonia is a differential diagnosis, to be ruled out with a chest x-ray. Start Trelegy inhaler once daily, providing a sample and demonstrating its use. Advise rinsing her mouth after use to prevent irritation. Prescribe doxycycline  100 mg twice daily with food and advise avoiding sun exposure. (PCN allergry)  Prescribe prednisone  to reduce airway inflammation. Administer an in-office breathing treatment with albuterol  neb.  Order a chest x-ray to rule out pneumonia. Continue loratadine for allergies. Recommend Mucinex DM liquid for congestion. Remove Breztri  from her medication list.  Plan  Patient Instructions  Chest xray today.  Begin Doxycycline  100mg  Twice daily , take with food.  Prednisone  20mg  daily for 5 days, take with food.  Liquid Mucinex DM Twice daily  As needed  cough.  Begin Trelegy 1 puff daily  Albuterol  inhaler As needed   Follow up with Giani Winther NP in 6 weeks and As needed   Please contact office for sooner follow up if symptoms do not improve or worsen or seek emergency care         Madelin Stank, NP 02/19/2024

## 2024-02-19 NOTE — Patient Instructions (Signed)
 Chest xray today.  Begin Doxycycline  100mg  Twice daily , take with food.  Prednisone  20mg  daily for 5 days, take with food.  Liquid Mucinex DM Twice daily  As needed  cough.  Begin Trelegy 1 puff daily  Albuterol  inhaler As needed   Follow up with Gennell How NP in 6 weeks and As needed   Please contact office for sooner follow up if symptoms do not improve or worsen or seek emergency care

## 2024-02-21 ENCOUNTER — Other Ambulatory Visit: Payer: Self-pay | Admitting: Family Medicine

## 2024-02-21 DIAGNOSIS — J4489 Other specified chronic obstructive pulmonary disease: Secondary | ICD-10-CM

## 2024-02-23 ENCOUNTER — Other Ambulatory Visit: Payer: Self-pay | Admitting: Family Medicine

## 2024-03-04 ENCOUNTER — Other Ambulatory Visit: Payer: Self-pay | Admitting: Family Medicine

## 2024-03-04 DIAGNOSIS — E785 Hyperlipidemia, unspecified: Secondary | ICD-10-CM

## 2024-03-27 ENCOUNTER — Other Ambulatory Visit: Payer: Self-pay | Admitting: Family Medicine

## 2024-03-27 DIAGNOSIS — E782 Mixed hyperlipidemia: Secondary | ICD-10-CM

## 2024-03-27 DIAGNOSIS — N1832 Chronic kidney disease, stage 3b: Secondary | ICD-10-CM | POA: Diagnosis not present

## 2024-03-27 DIAGNOSIS — I6529 Occlusion and stenosis of unspecified carotid artery: Secondary | ICD-10-CM | POA: Diagnosis not present

## 2024-03-27 DIAGNOSIS — D631 Anemia in chronic kidney disease: Secondary | ICD-10-CM | POA: Diagnosis not present

## 2024-03-27 DIAGNOSIS — I129 Hypertensive chronic kidney disease with stage 1 through stage 4 chronic kidney disease, or unspecified chronic kidney disease: Secondary | ICD-10-CM | POA: Diagnosis not present

## 2024-03-27 DIAGNOSIS — I509 Heart failure, unspecified: Secondary | ICD-10-CM | POA: Diagnosis not present

## 2024-03-27 DIAGNOSIS — E785 Hyperlipidemia, unspecified: Secondary | ICD-10-CM | POA: Diagnosis not present

## 2024-03-27 DIAGNOSIS — R251 Tremor, unspecified: Secondary | ICD-10-CM | POA: Diagnosis not present

## 2024-03-27 LAB — LAB REPORT - SCANNED: EGFR: 42

## 2024-03-31 ENCOUNTER — Other Ambulatory Visit: Payer: Self-pay | Admitting: Family Medicine

## 2024-04-15 NOTE — Assessment & Plan Note (Signed)
 Hydrate and monitor

## 2024-04-15 NOTE — Assessment & Plan Note (Signed)
 Follows with neurology, Dr. Evonnie.  Stable on primidone .

## 2024-04-15 NOTE — Assessment & Plan Note (Signed)
 Denies recent exacerbation.  Pt d/c  Trelegy inhaler due to high cost.  Follows with pulmonology, has upcoming appointment. Advised to discuss different maintenance inhaler options.

## 2024-04-15 NOTE — Progress Notes (Unsigned)
 Subjective:     Patient ID: Emily Mitchell, female    DOB: Dec 08, 1945, 78 y.o.   MRN: [id number]  No chief complaint on file.  Discussed the use of AI scribe software for clinical note transcription with the patient, who gave verbal consent to proceed.  HPI Emily Mitchell is a 78 year old female presents for follow-up chronic conditions. Lives with [facility]   PMHx-history of CAD, atherosclerotic cerebrovascular disease,HTN, mixed dyslipidemia and renal insufficiency.   Follows with  HTN-metoprolol  25 mg daily  HLD-atorvastatin  10 mg daily, fenofibrate  135 mg daily, Zetia  10 mg daily  CKD-follows with [facility]??????? Dr. Fairy Shingles  Anxiety-buspirone  5 mg 3 times daily, clonazepam  1 tablet 3 times daily as needed  Vitamin D  deficiency-1000 units daily  Vitamin B12 deficiency-500 mcg daily  COPD-follows with pulmonology Trelegy Ellipta  inhaler daily  Chronic pain/neuropathy-gabapentin  100 mg at bedtime  Hypothyroidism-levothyroxine  100 mcg daily  Allergies-Singulair  10 mg at bedtime  Essential tremor-follows with neurology -Dr. Evonnie  Patient denies fever, chills, SOB, CP, palpitations, dyspnea, edema, HA, vision changes, N/V/D, abdominal pain, urinary symptoms, rash, weight changes, and recent illness or hospitalizations.   History of Present Illness              Health Maintenance Due  Topic Date Due   DTaP/Tdap/Td (2 - Td or Tdap) 05/27/2023   Influenza Vaccine  02/29/2024   COVID-19 Vaccine (6 - 2025-26 season) 03/31/2024   Medicare Annual Wellness (AWV)  05/01/2024    Past Medical History:  Diagnosis Date   Acid reflux    Allergic rhinitis 05/19/2022   Anemia 07/05/2014   Anxiety and depression 12/14/2008   Qualifier: Diagnosis of  By: Gerlean LPN, Mliss     Arthritis    fingers, toes, knees, joints (07/18/2018)   Asthma    Atherosclerotic heart disease of native coronary artery without angina pectoris 07/19/2018   Atypical chest  pain 05/23/2015   Back pain 02/02/2019   Breast mass, left 04/28/2019   CAD in native artery 07/19/2018   Candidal skin infection 02/28/2018   Carotid artery disease (HCC) 06/16/2015   S/p CEA    CHF (congestive heart failure) (HCC)    Chicken pox as a child   Chronic obstructive pulmonary disease (HCC) 12/14/2008   Formatting of this note might be different from the original. Overview:  Spirometry 05/12/2013: FEV1 72% predicted FEV1 FVC ratio 73 predicted FEF 25 75  43% predicted  Last Assessment & Plan:  OK to stop taking advair Use albuterol  as needed only Check oxygen  levels during sleep - we will call you with results Call if symptoms worse   Chronic rhinitis 06/14/2018   CKD (chronic kidney disease), stage IV (HCC)    Follows with [facility], Dr Fairy Shingles    Congestive heart failure (HCC) 11/30/2015   Formatting of this note might be different from the original. Last Assessment & Plan:  No recent exacerbation   Constipation 12/17/2008   Qualifier: Diagnosis of  By: Ezzard DEVONNA Sonny CANDIE    COPD with asthma (HCC) 11/06/2017   Dry eyes 11/30/2015   Essential hypertension 10/18/2015   Formatting of this note might be different from the original. Last Assessment & Plan:  poorlycontrolled and under a great deal of stress, add Metoprolol  XL 25 mg daily. Encouraged heart healthy diet such as the DASH diet and exercise as tolerated.   Essential tremor 11/05/2013   Exudative age-related macular degeneration of right eye with active choroidal neovascularization (HCC) 08/26/2015  Gastro-esophageal reflux disease without esophagitis 12/14/2008   Formatting of this note might be different from the original. Formatting of this note might be different from the original. Formatting of this note might be different from the original. Overview:  Qualifier: Diagnosis of  By: Gerlean LPN, Mliss   Last Assessment & Plan:  Avoid offending foods, start probiotics. Do not eat large meals in late  evening and consider raising head of bed. Last Assessment   GERD (gastroesophageal reflux disease)    H. pylori infection 10/19/2013   H/O multiple allergies 07/14/2018   Helicobacter pylori (H. pylori) as the cause of diseases classified elsewhere 10/19/2013   Formatting of this note might be different from the original. Last Assessment & Plan:  Tetracycline , Pepto Bismol, Flagyl  and Omeprazole .   Hepatitis 1970s   don't know for sure which one it was; think it was A (07/18/2018)   History of food allergy  08/14/2018   History of pulmonary embolism 07/17/2018   Hoarseness 03/09/2019   Hypercalcemia 03/09/2019   Hyperglycemia 03/14/2016   Hyperlipidemia, mixed 05/26/2013   Crestor caused N/V Did not tolerate Lipitor    Knee pain, bilateral 12/17/2012   Left hip pain 05/23/2015   Low back pain 09/19/2015   Lower urinary tract infectious disease 10/19/2013   Lymphadenopathy 10/18/2017   Macular degeneration of both eyes 12/16/2015   Right eye is wet Left Eye is dry Shots every 11 to 12 weeks in right eye at opthamologist office   Medicare annual wellness visit, subsequent 02/21/2015   Mixed anxiety depressive disorder 12/14/2008   Formatting of this note might be different from the original. Overview:  Qualifier: Diagnosis of  By: Gerlean LPN, Mliss  Patient had increased  Tremors on Bupropion   Last Assessment & Plan:  Is struggling with the stress of her 15 year old granddaughter whom she raised moving back with her own mother, the patient is estranged from her daughter so she is very upset. Increase Citalopram  40 mg daily    Mumps 78 yrs old   Neck pain 04/22/2017   Numbness of finger 12/17/2012   Occlusion and stenosis of carotid artery without mention of cerebral infarction 12/17/2012   OSA (obstructive sleep apnea) 12/14/2008   PSG  06/2013-AHI 6/h 08/2011 (bethany) AHI 4/h No longer needs CPAP    Preventative health care 03/14/2016   Pulmonary emboli (HCC) 05/26/2013   Recurrent  infection of skin 07/19/2021   Rheumatoid factor positive 09/27/2021   Right shoulder pain 10/18/2017   Sensation of foreign body in larynx 04/08/2019   Skin lesion of back 03/27/2021   Sleep apnea    don't use CPAP anymore; dr said I didn't have to (07/18/2018)   Static tremor 11/05/2013   Formatting of this note might be different from the original. Last Assessment & Plan:  Follows with [facility]   Statin intolerance 09/29/2019   Stroke [facility])    been told I've had some strokes; didn't know I'd had them (07/18/2018)   Syncope 06/11/2023   Thyroid  disease    Vitamin D  deficiency 03/03/2018   Vitreomacular adhesion of both eyes 01/07/2019   Formatting of this note might be different from the original. no traction    Past Surgical History:  Procedure Laterality Date   APPENDECTOMY  1984   CARDIAC CATHETERIZATION  ~ 2006   CAROTID ANGIOGRAPHY Left 08/20/2018   Procedure: CAROTID ANGIOGRAPHY;  Surgeon: Serene Gaile ORN, MD;  Location: [facility]AS[facility];  Service: Cardiovascular;  Laterality: Left;  CAROTID ENDARTERECTOMY Right 05/10/2007   cea   CATARACT EXTRACTION Left    CATARACT EXTRACTION W/ INTRAOCULAR LENS  IMPLANT, BILATERAL Bilateral    COLONOSCOPY  09/25/2008   normal   COLONOSCOPY WITH PROPOFOL   02/13/2022   [facility] at [facility]   CORONARY ANGIOPLASTY WITH STENT PLACEMENT  07/18/2018   CORONARY PRESSURE/FFR STUDY N/A 07/18/2018   Procedure: INTRAVASCULAR PRESSURE WIRE/FFR STUDY;  Surgeon: Court Dorn PARAS, MD;  Location: [facility] CV [facility];  Service: Cardiovascular;  Laterality: N/A;   CORONARY STENT INTERVENTION N/A 07/18/2018   Procedure: CORONARY STENT INTERVENTION;  Surgeon: Court Dorn PARAS, MD;  Location: [facility];  Service: Cardiovascular;  Laterality: N/A;   JOINT REPLACEMENT     LEFT HEART CATH AND CORONARY ANGIOGRAPHY N/A 07/18/2018   Procedure: LEFT HEART CATH AND CORONARY ANGIOGRAPHY;  Surgeon: Court Dorn PARAS, MD;  Location: [facility];  Service: Cardiovascular;  Laterality: N/A;   MULTIPLE TOOTH EXTRACTIONS     TOTAL KNEE ARTHROPLASTY Bilateral 2005 - 2011   right - left   VAGINAL HYSTERECTOMY  1984   WISDOM TOOTH EXTRACTION      Family History  Problem Relation Age of Onset   Stroke Mother        mini stroke   Kidney disease Mother    Heart failure Mother    Hypertension Mother    Diabetes Sister        type 2   Kidney disease Sister    Allergic rhinitis Sister    Osteoarthritis Sister    Hyperlipidemia Sister    Heart attack Maternal Grandfather    Colon cancer Neg Hx    Stomach cancer Neg Hx    Rectal cancer Neg Hx     Social History   Socioeconomic History   Marital status: Divorced    Spouse name: Not on file   Number of children: 2   Years of education: Not on file   Highest education level: 8th grade  Occupational History   Not on file  Tobacco Use   Smoking status: Former    Current packs/day: 0.00    Average packs/day: 2.0 packs/day for 30.0 years (60.0 ttl pk-yrs)    Types: Cigarettes    Start date: 12/02/1964    Quit date: 12/03/1994    Years since quitting: 29.3   Smokeless tobacco: Never   Tobacco comments:    Quit smoking 26 years ago  Vaping Use   Vaping status: Never Used  Substance and Sexual Activity   Alcohol use: Yes    Comment: special occasions   Drug use: Never   Sexual activity: Not Currently    Birth control/protection: Post-menopausal    Comment: lives with son and friend, no dietary restrictions  Other Topics Concern   Not on file  Social History Narrative   Right handed   No caffeine   One story home   Social Drivers of Health   Financial Resource Strain: Low Risk  (01/25/2024)   Overall Financial Resource Strain (CARDIA)    Difficulty of Paying Living Expenses: Not hard at all  Food Insecurity: No Food Insecurity (01/25/2024)   Hunger Vital Sign    Worried About Running Out of Food in the Last Year: Never true    Ran Out of Food in the Last Year:  Never true  Transportation Needs: No Transportation Needs (01/25/2024)   PRAPARE - Administrator, Civil Service (Medical): No    Lack of Transportation (  Non-Medical): No  Physical Activity: Sufficiently Active (01/25/2024)   Exercise Vital Sign    Days of Exercise per Week: 7 days    Minutes of Exercise per Session: 30 min  Stress: No Stress Concern Present (01/25/2024)   [employer] of Occupational Health - Occupational Stress Questionnaire    Feeling of Stress: Not at all  Social Connections: Socially Isolated (01/25/2024)   Social Connection and Isolation Panel    Frequency of Communication with Friends and Family: More than three times a week    Frequency of Social Gatherings with Friends and Family: More than three times a week    Attends Religious Services: Never    Database administrator or Organizations: No    Attends Engineer, structural: Not on file    Marital Status: Divorced  Intimate Partner Violence: Not At Risk (05/02/2023)   Humiliation, Afraid, Rape, and Kick questionnaire    Fear of Current or Ex-Partner: No    Emotionally Abused: No    Physically Abused: No    Sexually Abused: No    Outpatient Medications Prior to Visit  Medication Sig Dispense Refill   acetaminophen  (TYLENOL ) 500 MG tablet Take 500 mg by mouth as needed for headache.     albuterol  (VENTOLIN  HFA) 108 (90 Base) MCG/ACT inhaler Inhale 2 puffs into the lungs every 6 (six) hours as needed for wheezing or shortness of breath. 18 g 5   aspirin  EC 81 MG tablet Take 1 tablet (81 mg total) by mouth daily. 90 tablet 3   atorvastatin  (LIPITOR) 10 MG tablet TAKE 1 TABLET BY MOUTH EVERY DAY 90 tablet 1   benzonatate  (TESSALON ) 100 MG capsule Take 1 capsule (100 mg total) by mouth 2 (two) times daily as needed for cough. 20 capsule 0   busPIRone  (BUSPAR ) 5 MG tablet TAKE 1 TABLET BY MOUTH THREE TIMES A DAY 270 tablet 1   Calcium  Citrate-Vitamin D  200-250 MG-UNIT TABS Take 1 tablet by  mouth daily.     Cholecalciferol 25 MCG (1000 UT) tablet Take 1,000 Units by mouth daily.     Choline Fenofibrate  (FENOFIBRIC ACID ) 135 MG CPDR Take 1 tablet by mouth daily. 90 capsule 1   clonazePAM  (KLONOPIN ) 1 MG tablet TAKE 1 TABLET BY MOUTH 3 TIMES DAILY AS NEEDED FOR ANXIETY. 90 tablet 0   clonazePAM  (KLONOPIN ) 1 MG tablet Take 1 tablet (1 mg total) by mouth 3 (three) times daily as needed for anxiety. 90 tablet 0   cyanocobalamin  100 MCG tablet Take 500 mcg by mouth daily.     doxycycline  (VIBRA -TABS) 100 MG tablet Take 1 tablet (100 mg total) by mouth 2 (two) times daily. 14 tablet 0   esomeprazole  (NEXIUM ) 40 MG capsule TAKE 1 CAPSULE (40 MG TOTAL) BY MOUTH DAILY. 90 capsule 1   ezetimibe  (ZETIA ) 10 MG tablet TAKE 1 TABLET BY MOUTH EVERY DAY 90 tablet 1   Fluticasone -Umeclidin-Vilant (TRELEGY ELLIPTA ) 100-62.5-25 MCG/ACT AEPB Inhale 1 puff into the lungs daily. 1 each 5   Fluticasone -Umeclidin-Vilant (TRELEGY ELLIPTA ) 100-62.5-25 MCG/ACT AEPB Inhale 1 puff into the lungs daily.     gabapentin  (NEURONTIN ) 100 MG capsule TAKE 2 CAPSULES BY MOUTH AT BEDTIME 180 capsule 0   levothyroxine  (SYNTHROID ) 100 MCG tablet TAKE 1 TABLET BY MOUTH EVERY DAY 90 tablet 2   metoprolol  succinate (TOPROL -XL) 25 MG 24 hr tablet TAKE 1 TABLET (25 MG TOTAL) BY MOUTH DAILY. 90 tablet 1   montelukast  (SINGULAIR ) 10 MG tablet TAKE 1 TABLET BY MOUTH  AT BEDTIME AS NEEDED. 90 tablet 3   NON FORMULARY Take 1 tablet by mouth daily. Digestive advantage.     Omega-3 Fatty Acids (FISH OIL PO) Take 1 capsule by mouth daily.     predniSONE  (DELTASONE ) 20 MG tablet Take 1 tablet (20 mg total) by mouth daily with breakfast. 5 tablet 0   primidone  (MYSOLINE ) 50 MG tablet Take 1 tablet (50 mg total) by mouth 2 (two) times daily. 180 tablet 2   Probiotic Product (VSL#3) CAPS Take 1 capsule by mouth daily.     No facility-administered medications prior to visit.    Allergies  Allergen Reactions   Niaspan [Niacin Er  (Antihyperlipidemic)] Nausea And Vomiting and Swelling    Swelling in mouth   Pantoprazole      Mouth sores   Rosuvastatin Calcium  Anaphylaxis   Sulfonamide Derivatives Hives   Bupropion  Other (See Comments)    Uncontrollable shakes   Nitroglycerin      NOT allergic - cardiology has recommended she NOT use this due to h/o severe carotid disease as it may decrease cerebral perfusion   Penicillins Hives    DID THE REACTION INVOLVE: Swelling of the face/tongue/throat, SOB, or low BP? Yes Sudden or severe rash/hives, skin peeling, or the inside of the mouth or nose? Unknown Did it require medical treatment? Unknown When did it last happen?   [age]  If all above answers are "NO", may proceed with cephalosporin use.   Statins    Apple Rash   Apple Juice Rash   Banana Rash    ROS   See HPI Objective:    Physical Exam  General: No acute distress. Awake and conversant.  Eyes: Normal conjunctiva, anicteric. Round symmetric pupils.  ENT: Hearing grossly intact. No nasal discharge.  Neck: Neck is supple. No masses or thyromegaly.  Respiratory: CTAB. Respirations are non-labored. No wheezing.  Skin: Warm. No rashes or ulcers.  Psych: Alert and oriented. Cooperative, Appropriate mood and affect, Normal judgment.  CV: RRR. No murmur. No lower extremity edema.  MSK: Normal ambulation. No clubbing or cyanosis.  Neuro:  CN II-XII grossly normal.   There were no vitals taken for this visit. Wt Readings from Last 3 Encounters:  02/19/24 177 lb 3.2 oz (80.4 kg)  02/14/24 174 lb (78.9 kg)  01/28/24 176 lb (79.8 kg)       Assessment & Plan:   Problem List Items Addressed This Visit     Chronic obstructive pulmonary disease (HCC)   Denies recent exacerbation.  Follows with pulmonology.      CKD (chronic kidney disease), stage IV (HCC) - Primary   Hydrate and monitor      Essential hypertension   Well controlled, no changes to meds. Encouraged heart healthy diet such as the DASH diet  and exercise as tolerated.       Essential tremor   Follows with neurology, Dr. Evonnie.  Stable on primidone .      Mild cognitive impairment    I am having Emily Mitchell maintain her aspirin  EC, Omega-3 Fatty Acids (FISH OIL PO), acetaminophen , VSL#3, Calcium  Citrate-Vitamin D , Cholecalciferol, cyanocobalamin , NON FORMULARY, esomeprazole , Fenofibric Acid , busPIRone , clonazePAM , clonazePAM , metoprolol  succinate, primidone , benzonatate , doxycycline , predniSONE , Trelegy Ellipta , Trelegy Ellipta , albuterol , gabapentin , montelukast , ezetimibe , atorvastatin , and levothyroxine .  No orders of the defined types were placed in this encounter.

## 2024-04-15 NOTE — Assessment & Plan Note (Signed)
 Well controlled, no changes to meds. Encouraged heart healthy diet such as the DASH diet and exercise as tolerated.

## 2024-04-16 ENCOUNTER — Encounter: Payer: Self-pay | Admitting: Student

## 2024-04-16 ENCOUNTER — Ambulatory Visit (INDEPENDENT_AMBULATORY_CARE_PROVIDER_SITE_OTHER): Admitting: Student

## 2024-04-16 VITALS — BP 122/80 | HR 53 | Temp 98.0°F | Resp 12 | Ht 62.0 in | Wt 176.4 lb

## 2024-04-16 DIAGNOSIS — W19XXXA Unspecified fall, initial encounter: Secondary | ICD-10-CM | POA: Insufficient documentation

## 2024-04-16 DIAGNOSIS — Z23 Encounter for immunization: Secondary | ICD-10-CM

## 2024-04-16 DIAGNOSIS — W19XXXS Unspecified fall, sequela: Secondary | ICD-10-CM

## 2024-04-16 DIAGNOSIS — I10 Essential (primary) hypertension: Secondary | ICD-10-CM

## 2024-04-16 DIAGNOSIS — N184 Chronic kidney disease, stage 4 (severe): Secondary | ICD-10-CM | POA: Diagnosis not present

## 2024-04-16 DIAGNOSIS — Z79899 Other long term (current) drug therapy: Secondary | ICD-10-CM | POA: Diagnosis not present

## 2024-04-16 DIAGNOSIS — G3184 Mild cognitive impairment, so stated: Secondary | ICD-10-CM

## 2024-04-16 DIAGNOSIS — J449 Chronic obstructive pulmonary disease, unspecified: Secondary | ICD-10-CM

## 2024-04-16 DIAGNOSIS — F32A Depression, unspecified: Secondary | ICD-10-CM | POA: Diagnosis not present

## 2024-04-16 DIAGNOSIS — F419 Anxiety disorder, unspecified: Secondary | ICD-10-CM

## 2024-04-16 DIAGNOSIS — G250 Essential tremor: Secondary | ICD-10-CM | POA: Diagnosis not present

## 2024-04-16 HISTORY — DX: Unspecified fall, initial encounter: W19.XXXA

## 2024-04-16 NOTE — Assessment & Plan Note (Signed)
 Follows with neurology.  Her daughter and son help regularly in the home.  Patient's daughter reports she and her brother switch off caring for patient and live in the home half of the time

## 2024-04-16 NOTE — Assessment & Plan Note (Addendum)
 Stable. Denies SI/HI. Taking clonazepam  at bedtime and sparingly during the day for panic/stress.  UDS and contract updated today.

## 2024-04-16 NOTE — Patient Instructions (Signed)
 Schedule annual wellness visit at check out.  Get TDap (tetanus) at pharmacy.

## 2024-04-16 NOTE — Assessment & Plan Note (Addendum)
-   Continue Tylenol  for pain. -Discussed physical therapy to help improve gait stability and strength, patient denies at this time. - Monitor for worsening symptoms, RTC if symptoms persist or worsen

## 2024-04-18 LAB — DRUG MONITORING PANEL 376104, URINE
Alphahydroxyalprazolam: NEGATIVE ng/mL (ref <25)
Alphahydroxymidazolam: NEGATIVE ng/mL (ref <50)
Alphahydroxytriazolam: NEGATIVE ng/mL (ref <50)
Aminoclonazepam: 784 ng/mL — ABNORMAL HIGH (ref <25)
Amobarbital: NEGATIVE ng/mL (ref <100)
Amphetamines: NEGATIVE ng/mL (ref <500)
Barbiturates: POSITIVE ng/mL — AB (ref <300)
Benzodiazepines: POSITIVE ng/mL — AB (ref <100)
Butalbital: NEGATIVE ng/mL (ref <100)
Cocaine Metabolite: NEGATIVE ng/mL (ref <150)
Desmethyltramadol: NEGATIVE ng/mL (ref <100)
Hydroxyethylflurazepam: NEGATIVE ng/mL (ref <50)
Lorazepam: NEGATIVE ng/mL (ref <50)
Nordiazepam: NEGATIVE ng/mL (ref <50)
Opiates: NEGATIVE ng/mL (ref <100)
Oxazepam: NEGATIVE ng/mL (ref <50)
Oxycodone: NEGATIVE ng/mL (ref <100)
Pentobarbital: NEGATIVE ng/mL (ref <100)
Phenobarbital: 2826 ng/mL — ABNORMAL HIGH (ref <100)
Secobarbital: NEGATIVE ng/mL (ref <100)
Temazepam: NEGATIVE ng/mL (ref <50)
Tramadol: NEGATIVE ng/mL (ref <100)

## 2024-04-18 LAB — DM TEMPLATE

## 2024-04-20 ENCOUNTER — Other Ambulatory Visit: Payer: Self-pay | Admitting: Family Medicine

## 2024-04-21 ENCOUNTER — Ambulatory Visit: Payer: Self-pay | Admitting: Student

## 2024-04-21 ENCOUNTER — Encounter: Payer: Self-pay | Admitting: Adult Health

## 2024-04-21 ENCOUNTER — Ambulatory Visit: Admitting: Adult Health

## 2024-04-21 VITALS — BP 157/72 | HR 55 | Temp 97.5°F | Ht 62.0 in | Wt 176.0 lb

## 2024-04-21 DIAGNOSIS — J4489 Other specified chronic obstructive pulmonary disease: Secondary | ICD-10-CM

## 2024-04-21 DIAGNOSIS — J309 Allergic rhinitis, unspecified: Secondary | ICD-10-CM

## 2024-04-21 DIAGNOSIS — J441 Chronic obstructive pulmonary disease with (acute) exacerbation: Secondary | ICD-10-CM

## 2024-04-21 MED ORDER — FLUTICASONE-SALMETEROL 250-50 MCG/ACT IN AEPB: 1.0000 | INHALATION_SPRAY | Freq: Two times a day (BID) | RESPIRATORY_TRACT | 5 refills | Status: AC

## 2024-04-21 NOTE — Patient Instructions (Addendum)
 Liquid Mucinex DM Twice daily  As needed  cough.  Begin Wixela 1 puff Twice daily, rinse after use.  Albuterol  inhaler As needed   Continue on Singulair  daily  Follow up with Lilyannah Zuelke NP or Dr. Kara in 6 months and As needed   Please contact office for sooner follow up if symptoms do not improve or worsen or seek emergency care

## 2024-04-21 NOTE — Progress Notes (Signed)
 @Patient  ID: Emily Mitchell, female    DOB: 12-Jan-1946, 78 y.o.   MRN: [id number]  No chief complaint on file.   Referring provider: Domenica Harlene LABOR, MD  HPI: 78 year old female former smoker followed for COPD with asthma Medical history significant for pulmonary embolism in 2014 on Coumadin  from 2014-2019 History of mild sleep apnea previously on CPAP stopped after significant weight loss Medical history significant for stage IV chronic kidney disease  TEST/EVENTS :  PSG at [facility] 08/2011 -217 lbs -AHI 4/h, TST 319 mins,supine    PSG 07/2013 -208 lbs -showed mild OSA TST - 390 mins,  AHI  6/h, RDI of 8/h The lowest desaturation was 87%      Spirometry 04/2013: FEV1 72% predicted FEV1 FVC ratio 73 Spirometry  10/2017  severe airway obstruction with ratio 60, FEV1 of 41% and FVC of 52%    Echo 08/2014 grade 2 DD   12/2014 ONO - no desatn >> dc O2   PFTs 06/2023  show stable lung function (improved since 2019) but FEV1 at 74%, ratio 77, FVC 72%, DLCO 78%  04/21/2024 Follow up : COPD with Asthma Discussed the use of AI scribe software for clinical note transcription with the patient, who gave verbal consent to proceed.  History of Present Illness Emily Mitchell is a 78 year old female with COPD and asthma who presents for a six week follow-up after a COPD exacerbation.  She was treated six weeks ago for a COPD exacerbation with doxycycline  for one week and prednisone  20 mg daily for five days. Since then, she has experienced an overall improvement in symptoms, including a reduction in cough and wheezing.  She was prescribed Trelegy inhaler but did not obtain it due to cost concerns. She has previously used Breztri  and is considering other options for a once-daily inhaler.  Her medical history includes COPD with asthma, a pulmonary embolism in 2014 for which she was on Coumadin  therapy from 2014 to 2019, mild sleep apnea previously managed with CPAP, and chronic kidney disease. She  is a former smoker.  She is currently taking montelukast  (Singulair ) and uses an albuterol  inhaler as needed, which she carries with her at all times. A chest x-ray was performed recently, and a breathing test was done in November of last year.  She received a flu shot last Wednesday and declined another one today.    Allergies  Allergen Reactions   Niaspan [Niacin Er (Antihyperlipidemic)] Nausea And Vomiting and Swelling    Swelling in mouth   Pantoprazole      Mouth sores   Rosuvastatin Calcium  Anaphylaxis   Sulfonamide Derivatives Hives   Bupropion  Other (See Comments)    Uncontrollable shakes   Nitroglycerin      NOT allergic - cardiology has recommended she NOT use this due to h/o severe carotid disease as it may decrease cerebral perfusion   Penicillins Hives    DID THE REACTION INVOLVE: Swelling of the face/tongue/throat, SOB, or low BP? Yes Sudden or severe rash/hives, skin peeling, or the inside of the mouth or nose? Unknown Did it require medical treatment? Unknown When did it last happen?   [age]  If all above answers are "NO", may proceed with cephalosporin use.   Statins    Apple Rash   Apple Juice Rash   Banana Rash    Immunization History  Administered Date(s) Administered   Fluad Quad(high Dose 65+) 04/17/2019, 04/14/2020, 04/11/2022   INFLUENZA, HIGH DOSE SEASONAL PF 03/30/2016, 04/19/2017, 05/02/2018, 04/30/2023, 04/16/2024  Influenza Split 04/29/2013   Influenza,inj,Quad PF,6+ Mos 03/26/2014, 05/17/2015   Influenza-Unspecified 06/14/2021   PFIZER(Purple Top)SARS-COV-2 Vaccination 10/19/2019, 11/10/2019, 05/08/2020, 10/30/2020   Pfizer Covid-19 Vaccine Bivalent Booster 21yrs & up 05/18/2021   Pneumococcal Conjugate-13 04/29/2013   Pneumococcal Polysaccharide-23 04/30/2014, 04/17/2019   Respiratory Syncytial Virus Vaccine,Recomb Aduvanted(Arexvy) 06/14/2022   Tdap 05/26/2013   Zoster Recombinant(Shingrix) 04/27/2017, 06/13/2017   Zoster, Live 07/31/2012     Past Medical History:  Diagnosis Date   Acid reflux    Allergic rhinitis 05/19/2022   Anemia 07/05/2014   Anxiety and depression 12/14/2008   Qualifier: Diagnosis of  By: Gerlean LPN, Mliss     Arthritis    fingers, toes, knees, joints (07/18/2018)   Asthma    Atherosclerotic heart disease of native coronary artery without angina pectoris 07/19/2018   Atypical chest pain 05/23/2015   Back pain 02/02/2019   Breast mass, left 04/28/2019   CAD in native artery 07/19/2018   Candidal skin infection 02/28/2018   Carotid artery disease (HCC) 06/16/2015   S/p CEA    CHF (congestive heart failure) (HCC)    Chicken pox as a child   Chronic obstructive pulmonary disease (HCC) 12/14/2008   Formatting of this note might be different from the original. Overview:  Spirometry 05/12/2013: FEV1 72% predicted FEV1 FVC ratio 73 predicted FEF 25 75  43% predicted  Last Assessment & Plan:  OK to stop taking advair Use albuterol  as needed only Check oxygen  levels during sleep - we will call you with results Call if symptoms worse   Chronic rhinitis 06/14/2018   CKD (chronic kidney disease), stage IV (HCC)    Follows with [facility], Dr Fairy Shingles    Congestive heart failure (HCC) 11/30/2015   Formatting of this note might be different from the original. Last Assessment & Plan:  No recent exacerbation   Constipation 12/17/2008   Qualifier: Diagnosis of  By: Ezzard DEVONNA Sonny CANDIE    COPD with asthma (HCC) 11/06/2017   Dry eyes 11/30/2015   Essential hypertension 10/18/2015   Formatting of this note might be different from the original. Last Assessment & Plan:  poorlycontrolled and under a great deal of stress, add Metoprolol  XL 25 mg daily. Encouraged heart healthy diet such as the DASH diet and exercise as tolerated.   Essential tremor 11/05/2013   Exudative age-related macular degeneration of right eye with active choroidal neovascularization (HCC) 08/26/2015   Gastro-esophageal reflux  disease without esophagitis 12/14/2008   Formatting of this note might be different from the original. Formatting of this note might be different from the original. Formatting of this note might be different from the original. Overview:  Qualifier: Diagnosis of  By: Gerlean LPN, Mliss   Last Assessment & Plan:  Avoid offending foods, start probiotics. Do not eat large meals in late evening and consider raising head of bed. Last Assessment   GERD (gastroesophageal reflux disease)    H. pylori infection 10/19/2013   H/O multiple allergies 07/14/2018   Helicobacter pylori (H. pylori) as the cause of diseases classified elsewhere 10/19/2013   Formatting of this note might be different from the original. Last Assessment & Plan:  Tetracycline , Pepto Bismol, Flagyl  and Omeprazole .   Hepatitis 1970s   don't know for sure which one it was; think it was A (07/18/2018)   History of food allergy  08/14/2018   History of pulmonary embolism 07/17/2018   Hoarseness 03/09/2019   Hypercalcemia 03/09/2019   Hyperglycemia 03/14/2016   Hyperlipidemia, mixed 05/26/2013  Crestor caused N/V Did not tolerate Lipitor    Knee pain, bilateral 12/17/2012   Left hip pain 05/23/2015   Low back pain 09/19/2015   Lower urinary tract infectious disease 10/19/2013   Lymphadenopathy 10/18/2017   Macular degeneration of both eyes 12/16/2015   Right eye is wet Left Eye is dry Shots every 11 to 12 weeks in right eye at opthamologist office   Medicare annual wellness visit, subsequent 02/21/2015   Mixed anxiety depressive disorder 12/14/2008   Formatting of this note might be different from the original. Overview:  Qualifier: Diagnosis of  By: Gerlean LPN, Mliss  Patient had increased  Tremors on Bupropion   Last Assessment & Plan:  Is struggling with the stress of her 77 year old granddaughter whom she raised moving back with her own mother, the patient is estranged from her daughter so she is very upset. Increase Citalopram  40  mg daily    Mumps 78 yrs old   Neck pain 04/22/2017   Numbness of finger 12/17/2012   Occlusion and stenosis of carotid artery without mention of cerebral infarction 12/17/2012   OSA (obstructive sleep apnea) 12/14/2008   PSG  06/2013-AHI 6/h 08/2011 ([facility]any) AHI 4/h No longer needs CPAP    Preventative health care 03/14/2016   Pulmonary emboli (HCC) 05/26/2013   Recurrent infection of skin 07/19/2021   Rheumatoid factor positive 09/27/2021   Right shoulder pain 10/18/2017   Sensation of foreign body in larynx 04/08/2019   Skin lesion of back 03/27/2021   Sleep apnea    don't use CPAP anymore; dr said I didn't have to (07/18/2018)   Static tremor 11/05/2013   Formatting of this note might be different from the original. Last Assessment & Plan:  Follows with [facility]   Statin intolerance 09/29/2019   Stroke [facility])    been told I've had some strokes; didn't know I'd had them (07/18/2018)   Syncope 06/11/2023   Thyroid  disease    Vitamin D  deficiency 03/03/2018   Vitreomacular adhesion of both eyes 01/07/2019   Formatting of this note might be different from the original. no traction    Tobacco History: Social History   Tobacco Use  Smoking Status Former   Current packs/day: 0.00   Average packs/day: 2.0 packs/day for 30.0 years (60.0 ttl pk-yrs)   Types: Cigarettes   Start date: 12/02/1964   Quit date: 12/03/1994   Years since quitting: 29.4  Smokeless Tobacco Never  Tobacco Comments   Quit smoking 26 years ago   Counseling given: Not Answered Tobacco comments: Quit smoking 26 years ago   Outpatient Medications Prior to Visit  Medication Sig Dispense Refill   acetaminophen  (TYLENOL ) 500 MG tablet Take 500 mg by mouth as needed for headache.     albuterol  (VENTOLIN  HFA) 108 (90 Base) MCG/ACT inhaler Inhale 2 puffs into the lungs every 6 (six) hours as needed for wheezing or shortness of breath. 18 g 5   aspirin  EC 81 MG tablet Take 1 tablet (81 mg total) by mouth  daily. 90 tablet 3   atorvastatin  (LIPITOR) 10 MG tablet TAKE 1 TABLET BY MOUTH EVERY DAY 90 tablet 1   Calcium  Citrate-Vitamin D  200-250 MG-UNIT TABS Take 1 tablet by mouth daily.     Cholecalciferol 25 MCG (1000 UT) tablet Take 1,000 Units by mouth daily.     Choline Fenofibrate  (FENOFIBRIC ACID ) 135 MG CPDR Take 1 tablet by mouth daily. 90 capsule 1   clonazePAM  (KLONOPIN ) 1 MG tablet Take 1 mg  by mouth 3 (three) times daily as needed for anxiety.     cyanocobalamin  100 MCG tablet Take 500 mcg by mouth daily.     esomeprazole  (NEXIUM ) 40 MG capsule TAKE 1 CAPSULE (40 MG TOTAL) BY MOUTH DAILY. 90 capsule 1   ezetimibe  (ZETIA ) 10 MG tablet TAKE 1 TABLET BY MOUTH EVERY DAY 90 tablet 1   FLUoxetine  (PROZAC ) 20 MG capsule Take 20 mg by mouth daily.     gabapentin  (NEURONTIN ) 100 MG capsule TAKE 2 CAPSULES BY MOUTH AT BEDTIME 180 capsule 0   levothyroxine  (SYNTHROID ) 100 MCG tablet TAKE 1 TABLET BY MOUTH EVERY DAY 90 tablet 2   metoprolol  succinate (TOPROL -XL) 25 MG 24 hr tablet TAKE 1 TABLET (25 MG TOTAL) BY MOUTH DAILY. 90 tablet 1   montelukast  (SINGULAIR ) 10 MG tablet TAKE 1 TABLET BY MOUTH AT BEDTIME AS NEEDED. 90 tablet 3   NON FORMULARY Take 1 tablet by mouth daily. Digestive advantage.     primidone  (MYSOLINE ) 50 MG tablet Take 1 tablet (50 mg total) by mouth 2 (two) times daily. 180 tablet 2   No facility-administered medications prior to visit.     Review of Systems:   Constitutional:   No  weight loss, night sweats,  Fevers, chills, fatigue, or  lassitude.  HEENT:   No headaches,  Difficulty swallowing,  Tooth/dental problems, or  Sore throat,                No sneezing, itching, ear ache, nasal congestion, post nasal drip,   CV:  No chest pain,  Orthopnea, PND, swelling in lower extremities, anasarca, dizziness, palpitations, syncope.   GI  No heartburn, indigestion, abdominal pain, nausea, vomiting, diarrhea, change in bowel habits, loss of appetite, bloody stools.   Resp:  No shortness of breath with exertion or at rest.  No excess mucus, no productive cough,  No non-productive cough,  No coughing up of blood.  No change in color of mucus.  No wheezing.  No chest wall deformity  Skin: no rash or lesions.  GU: no dysuria, change in color of urine, no urgency or frequency.  No flank pain, no hematuria   MS:  No joint pain or swelling.  No decreased range of motion.  No back pain.    Physical Exam  There were no vitals taken for this visit.  GEN: A/Ox3; pleasant , NAD, well nourished    HEENT:  Ray/AT,  EACs-clear, TMs-wnl, NOSE-clear, THROAT-clear, no lesions, no postnasal drip or exudate noted.   NECK:  Supple w/ fair ROM; no JVD; normal carotid impulses w/o bruits; no thyromegaly or nodules palpated; no lymphadenopathy.    RESP  Clear  P & A; w/o, wheezes/ rales/ or rhonchi. no accessory muscle use, no dullness to percussion  CARD:  RRR, no m/r/g, no peripheral edema, pulses intact, no cyanosis or clubbing.  GI:   Soft & nt; nml bowel sounds; no organomegaly or masses detected.   Musco: Warm bil, no deformities or joint swelling noted.   Neuro: alert, no focal deficits noted.    Skin: Warm, no lesions or rashes    Lab Results:  CBC    Component Value Date/Time   WBC 6.3 11/08/2023 1539   RBC 4.29 11/08/2023 1539   HGB 13.1 11/08/2023 1539   HGB 12.3 07/17/2018 1508   HCT 39.9 11/08/2023 1539   HCT 37.2 07/17/2018 1508   PLT 315.0 11/08/2023 1539   PLT 275 07/17/2018 1508   MCV 92.9 11/08/2023  1539   MCV 87 07/17/2018 1508   MCH 30.0 02/15/2021 1018   MCHC 32.9 11/08/2023 1539   RDW 14.9 11/08/2023 1539   RDW 13.2 07/17/2018 1508   LYMPHSABS 1.7 11/08/2023 1539   LYMPHSABS 2.0 07/17/2018 1508   MONOABS 0.5 11/08/2023 1539   EOSABS 0.1 11/08/2023 1539   EOSABS 0.2 07/17/2018 1508   BASOSABS 0.1 11/08/2023 1539   BASOSABS 0.1 07/17/2018 1508    BMET    Component Value Date/Time   NA 141 01/28/2024 1249   NA 140 10/31/2021  0000   K 4.8 01/28/2024 1249   CL 105 01/28/2024 1249   CO2 27 01/28/2024 1249   GLUCOSE 74 01/28/2024 1249   BUN 25 01/28/2024 1249   BUN 28 (A) 10/31/2021 0000   CREATININE 1.25 (H) 01/28/2024 1249   CALCIUM  9.7 01/28/2024 1249   CALCIUM  9.5 11/25/2009 1346   GFRNONAA 34 (L) 02/15/2021 1018   GFRAA 32 (L) 03/15/2019 2039    BNP    Component Value Date/Time   BNP 38.9 09/04/2018 2258    ProBNP    Component Value Date/Time   PROBNP 60.7 02/25/2007 1515    Imaging: No results found.  Administration History     None          Latest Ref Rng & Units 06/11/2023   12:43 PM  PFT Results  FVC-Pre L 1.95   FVC-Predicted Pre % 72   FVC-Post L 1.91   FVC-Predicted Post % 71   Pre FEV1/FVC % % 77   Post FEV1/FCV % % 77   FEV1-Pre L 1.50   FEV1-Predicted Pre % 74   FEV1-Post L 1.47   DLCO uncorrected ml/min/mmHg 14.58   DLCO UNC% % 78   DLCO corrected ml/min/mmHg 14.58   DLCO COR %Predicted % 78   DLVA Predicted % 101   TLC L 3.76   TLC % Predicted % 76   RV % Predicted % 72     No results found for: NITRICOXIDE      Assessment & Plan:   No problem-specific Assessment & Plan notes found for this encounter. Assessment and Plan Assessment & Plan Chronic obstructive pulmonary disease with asthma   COPD with an asthma component has improved after treatment with doxycycline  and prednisone . Lung function tests confirm the asthma component due to variable lung function. The last breathing test in November 2022 was well-managed, and the chest x-ray from the last visit was normal. Check the formulary for affordable inhaler options, including Breo, as Trelegy was not obtained due to cost. Continue montelukast  (Singulair ) and albuterol  inhaler as needed. Ensure mouth rinsing after inhaler use. Inhaler therapy is crucial to reduce airway inflammation and prevent exacerbations, minimizing the need for prednisone , which has adverse effects.      Madelin Stank,  NP 04/21/2024

## 2024-04-22 ENCOUNTER — Telehealth: Payer: Self-pay

## 2024-04-22 MED ORDER — FLUOXETINE HCL 20 MG PO CAPS: 20.0000 mg | ORAL_CAPSULE | Freq: Every day | ORAL | 1 refills | Status: DC

## 2024-04-22 NOTE — Telephone Encounter (Signed)
 Rx sent.

## 2024-04-22 NOTE — Telephone Encounter (Signed)
 Copied from CRM [id number]. Topic: Clinical - Medication Question >> Apr 21, 2024  4:40 PM Lauren C wrote: Reason for CRM: FYI- Pt calling to ask about fluoxetine  refill. She says she is almost out of the medication (2 left) and she is upset because she says the provider said she was refilling it at her appt on the 17th. I let her know it should not be too much longer and that it takes up to 3 business days.

## 2024-05-19 DIAGNOSIS — N1832 Chronic kidney disease, stage 3b: Secondary | ICD-10-CM | POA: Diagnosis not present

## 2024-05-26 ENCOUNTER — Other Ambulatory Visit: Payer: Self-pay | Admitting: Family Medicine

## 2024-05-26 ENCOUNTER — Other Ambulatory Visit: Payer: Self-pay | Admitting: Family

## 2024-05-26 ENCOUNTER — Ambulatory Visit (INDEPENDENT_AMBULATORY_CARE_PROVIDER_SITE_OTHER): Admitting: *Deleted

## 2024-05-26 ENCOUNTER — Telehealth: Payer: Self-pay | Admitting: *Deleted

## 2024-05-26 VITALS — BP 150/60 | HR 59 | Temp 99.0°F | Resp 18 | Ht 62.0 in | Wt 175.4 lb

## 2024-05-26 DIAGNOSIS — Z0000 Encounter for general adult medical examination without abnormal findings: Secondary | ICD-10-CM

## 2024-05-26 DIAGNOSIS — F419 Anxiety disorder, unspecified: Secondary | ICD-10-CM

## 2024-05-26 DIAGNOSIS — E782 Mixed hyperlipidemia: Secondary | ICD-10-CM

## 2024-05-26 DIAGNOSIS — Z780 Asymptomatic menopausal state: Secondary | ICD-10-CM

## 2024-05-26 NOTE — Progress Notes (Signed)
 Subjective:   Emily Mitchell is a 78 y.o. who presents for a Medicare Wellness preventive visit.  As a reminder, Annual Wellness Visits don't include a physical exam, and some assessments may be limited, especially if this visit is performed virtually. We may recommend an in-person follow-up visit with your provider if needed.  Visit Complete: In person   Persons Participating in Visit: Patient.  AWV Questionnaire: No: Patient Medicare AWV questionnaire was not completed prior to this visit.  Cardiac Risk Factors include: advanced age (>51men, >92 women);dyslipidemia;hypertension;Other (see comment), Risk factor comments: CKD, CAD, COPD, OSA, hx of stroke     Objective:    Today's Vitals   05/26/24 1333 05/26/24 1408  BP: (!) 161/51 (!) 150/60  Pulse: (!) 59   Resp: 18   Temp: 99 F (37.2 C)   TempSrc: Oral   SpO2: 99%   Weight: 175 lb 6.4 oz (79.6 kg)   Height: 5' 2 (1.575 m)    Body mass index is 32.08 kg/m.     05/26/2024    1:51 PM 02/08/2024    9:39 AM 08/06/2023    2:00 PM 05/02/2023    2:29 PM 03/28/2023    8:48 AM 04/19/2022    3:46 PM 04/20/2021   12:46 PM  Advanced Directives  Does Patient Have a Medical Advance Directive? Yes Yes No Yes No Yes Yes  Type of Estate Agent of [place];Living will Living will  Healthcare Power of [place];Living will  Healthcare Power of [place];Living will   Does patient want to make changes to medical advance directive? No - Patient declined   No - Patient declined  No - Patient declined   Copy of Healthcare Power of Attorney in Chart? Yes - validated most recent copy scanned in chart (See row information)   Yes - validated most recent copy scanned in chart (See row information)  Yes - validated most recent copy scanned in chart (See row information)     Current Medications (verified) Outpatient Encounter Medications as of 05/26/2024  Medication Sig   acetaminophen  (TYLENOL ) 500 MG tablet Take 500 mg by  mouth as needed for headache.   albuterol  (VENTOLIN  HFA) 108 (90 Base) MCG/ACT inhaler Inhale 2 puffs into the lungs every 6 (six) hours as needed for wheezing or shortness of breath.   aspirin  EC 81 MG tablet Take 1 tablet (81 mg total) by mouth daily.   atorvastatin  (LIPITOR) 10 MG tablet TAKE 1 TABLET BY MOUTH EVERY DAY   Calcium  Citrate-Vitamin D  200-250 MG-UNIT TABS Take 1 tablet by mouth daily.   Cholecalciferol 25 MCG (1000 UT) tablet Take 1,000 Units by mouth daily.   Choline Fenofibrate  (FENOFIBRIC ACID ) 135 MG CPDR Take 1 tablet by mouth daily.   clonazePAM  (KLONOPIN ) 1 MG tablet Take 1 mg by mouth 3 (three) times daily as needed for anxiety.   cyanocobalamin  100 MCG tablet Take 500 mcg by mouth daily.   esomeprazole  (NEXIUM ) 40 MG capsule TAKE 1 CAPSULE (40 MG TOTAL) BY MOUTH DAILY.   ezetimibe  (ZETIA ) 10 MG tablet TAKE 1 TABLET BY MOUTH EVERY DAY   FLUoxetine  (PROZAC ) 20 MG capsule Take 1 capsule (20 mg total) by mouth daily.   fluticasone -salmeterol (WIXELA INHUB) 250-50 MCG/ACT AEPB Inhale 1 puff into the lungs in the morning and at bedtime.   gabapentin  (NEURONTIN ) 100 MG capsule TAKE 2 CAPSULES BY MOUTH AT BEDTIME   levothyroxine  (SYNTHROID ) 100 MCG tablet TAKE 1 TABLET BY MOUTH EVERY DAY  metoprolol  succinate (TOPROL -XL) 25 MG 24 hr tablet TAKE 1 TABLET (25 MG TOTAL) BY MOUTH DAILY.   montelukast  (SINGULAIR ) 10 MG tablet TAKE 1 TABLET BY MOUTH AT BEDTIME AS NEEDED.   NON FORMULARY Take 1 tablet by mouth daily. Digestive advantage.   primidone  (MYSOLINE ) 50 MG tablet Take 1 tablet (50 mg total) by mouth 2 (two) times daily.   No facility-administered encounter medications on file as of 05/26/2024.    Allergies (verified) Niaspan [niacin er (antihyperlipidemic)], Pantoprazole , Rosuvastatin calcium , Sulfonamide derivatives, Bupropion , Nitroglycerin , Penicillins, Statins, Apple, Apple juice, and Banana   History: Past Medical History:  Diagnosis Date   Acid reflux     Allergic rhinitis 05/19/2022   Anemia 07/05/2014   Anxiety and depression 12/14/2008   Qualifier: Diagnosis of  By: Gerlean LPN, Mliss     Arthritis    fingers, toes, knees, joints (07/18/2018)   Asthma    Atherosclerotic heart disease of native coronary artery without angina pectoris 07/19/2018   Atypical chest pain 05/23/2015   Back pain 02/02/2019   Breast mass, left 04/28/2019   CAD in native artery 07/19/2018   Candidal skin infection 02/28/2018   Carotid artery disease 06/16/2015   S/p CEA    CHF (congestive heart failure) (HCC)    Chicken pox as a child   Chronic obstructive pulmonary disease (HCC) 12/14/2008   Formatting of this note might be different from the original. Overview:  Spirometry 05/12/2013: FEV1 72% predicted FEV1 FVC ratio 73 predicted FEF 25 75  43% predicted  Last Assessment & Plan:  OK to stop taking advair Use albuterol  as needed only Check oxygen  levels during sleep - we will call you with results Call if symptoms worse   Chronic rhinitis 06/14/2018   CKD (chronic kidney disease), stage IV (HCC)    Follows with [facility], Dr Fairy Shingles    Congestive heart failure (HCC) 11/30/2015   Formatting of this note might be different from the original. Last Assessment & Plan:  No recent exacerbation   Constipation 12/17/2008   Qualifier: Diagnosis of  By: Ezzard DEVONNA Sonny CANDIE    COPD with asthma (HCC) 11/06/2017   Dry eyes 11/30/2015   Essential hypertension 10/18/2015   Formatting of this note might be different from the original. Last Assessment & Plan:  poorlycontrolled and under a great deal of stress, add Metoprolol  XL 25 mg daily. Encouraged heart healthy diet such as the DASH diet and exercise as tolerated.   Essential tremor 11/05/2013   Exudative age-related macular degeneration of right eye with active choroidal neovascularization (HCC) 08/26/2015   Gastro-esophageal reflux disease without esophagitis 12/14/2008   Formatting of this note  might be different from the original. Formatting of this note might be different from the original. Formatting of this note might be different from the original. Overview:  Qualifier: Diagnosis of  By: Gerlean LPN, Mliss   Last Assessment & Plan:  Avoid offending foods, start probiotics. Do not eat large meals in late evening and consider raising head of bed. Last Assessment   GERD (gastroesophageal reflux disease)    H. pylori infection 10/19/2013   H/O multiple allergies 07/14/2018   Helicobacter pylori (H. pylori) as the cause of diseases classified elsewhere 10/19/2013   Formatting of this note might be different from the original. Last Assessment & Plan:  Tetracycline , Pepto Bismol, Flagyl  and Omeprazole .   Hepatitis 1970s   don't know for sure which one it was; think it was A (07/18/2018)   History  of food allergy  08/14/2018   History of pulmonary embolism 07/17/2018   Hoarseness 03/09/2019   Hypercalcemia 03/09/2019   Hyperglycemia 03/14/2016   Hyperlipidemia, mixed 05/26/2013   Crestor caused N/V Did not tolerate Lipitor    Knee pain, bilateral 12/17/2012   Left hip pain 05/23/2015   Low back pain 09/19/2015   Lower urinary tract infectious disease 10/19/2013   Lymphadenopathy 10/18/2017   Macular degeneration of both eyes 12/16/2015   Right eye is wet Left Eye is dry Shots every 11 to 12 weeks in right eye at opthamologist office   Medicare annual wellness visit, subsequent 02/21/2015   Mixed anxiety depressive disorder 12/14/2008   Formatting of this note might be different from the original. Overview:  Qualifier: Diagnosis of  By: Gerlean LPN, Mliss  Patient had increased  Tremors on Bupropion   Last Assessment & Plan:  Is struggling with the stress of her 33 year old granddaughter whom she raised moving back with her own mother, the patient is estranged from her daughter so she is very upset. Increase Citalopram  40 mg daily    Mumps 78 yrs old   Neck pain 04/22/2017   Numbness  of finger 12/17/2012   Occlusion and stenosis of carotid artery without mention of cerebral infarction 12/17/2012   OSA (obstructive sleep apnea) 12/14/2008   PSG  06/2013-AHI 6/h 08/2011 (bethany) AHI 4/h No longer needs CPAP    Preventative health care 03/14/2016   Pulmonary emboli (HCC) 05/26/2013   Recurrent infection of skin 07/19/2021   Rheumatoid factor positive 09/27/2021   Right shoulder pain 10/18/2017   Sensation of foreign body in larynx 04/08/2019   Skin lesion of back 03/27/2021   Sleep apnea    don't use CPAP anymore; dr said I didn't have to (07/18/2018)   Static tremor 11/05/2013   Formatting of this note might be different from the original. Last Assessment & Plan:  Follows with [facility]   Statin intolerance 09/29/2019   Stroke [facility])    been told I've had some strokes; didn't know I'd had them (07/18/2018)   Syncope 06/11/2023   Thyroid  disease    Vitamin D  deficiency 03/03/2018   Vitreomacular adhesion of both eyes 01/07/2019   Formatting of this note might be different from the original. no traction   Past Surgical History:  Procedure Laterality Date   APPENDECTOMY  1984   CARDIAC CATHETERIZATION  ~ 2006   CAROTID ANGIOGRAPHY Left 08/20/2018   Procedure: CAROTID ANGIOGRAPHY;  Surgeon: Serene Gaile ORN, MD;  Location: [facility] INVASIVE CV LAB;  Service: Cardiovascular;  Laterality: Left;   CAROTID ENDARTERECTOMY Right 05/10/2007   cea   CATARACT EXTRACTION Left    CATARACT EXTRACTION W/ INTRAOCULAR LENS  IMPLANT, BILATERAL Bilateral    COLONOSCOPY  09/25/2008   normal   COLONOSCOPY WITH PROPOFOL   02/13/2022   [facility]   CORONARY ANGIOPLASTY WITH STENT PLACEMENT  07/18/2018   CORONARY PRESSURE/FFR STUDY N/A 07/18/2018   Procedure: INTRAVASCULAR PRESSURE WIRE/FFR STUDY;  Surgeon: Court Dorn PARAS, MD;  Location: [facility] INVASIVE CV LAB;  Service: Cardiovascular;  Laterality: N/A;   CORONARY STENT INTERVENTION N/A 07/18/2018   Procedure: CORONARY  STENT INTERVENTION;  Surgeon: Court Dorn PARAS, MD;  Location: [facility] INVASIVE CV LAB;  Service: Cardiovascular;  Laterality: N/A;   JOINT REPLACEMENT     LEFT HEART CATH AND CORONARY ANGIOGRAPHY N/A 07/18/2018   Procedure: LEFT HEART CATH AND CORONARY ANGIOGRAPHY;  Surgeon: Court Dorn PARAS, MD;  Location: [facility] INVASIVE CV  LAB;  Service: Cardiovascular;  Laterality: N/A;   MULTIPLE TOOTH EXTRACTIONS     TOTAL KNEE ARTHROPLASTY Bilateral 2005 - 2011   right - left   VAGINAL HYSTERECTOMY  1984   WISDOM TOOTH EXTRACTION     Family History  Problem Relation Age of Onset   Stroke Mother        mini stroke   Kidney disease Mother    Heart failure Mother    Hypertension Mother    Diabetes Sister        type 2   Kidney disease Sister    Allergic rhinitis Sister    Osteoarthritis Sister    Hyperlipidemia Sister    Heart attack Maternal Grandfather    Colon cancer Neg Hx    Stomach cancer Neg Hx    Rectal cancer Neg Hx    Social History   Socioeconomic History   Marital status: Divorced    Spouse name: Not on file   Number of children: 2   Years of education: Not on file   Highest education level: 8th grade  Occupational History   Not on file  Tobacco Use   Smoking status: Former    Current packs/day: 0.00    Average packs/day: 2.0 packs/day for 30.0 years (60.0 ttl pk-yrs)    Types: Cigarettes    Start date: 12/02/1964    Quit date: 12/03/1994    Years since quitting: 29.4   Smokeless tobacco: Never   Tobacco comments:    Quit smoking 26 years ago  Vaping Use   Vaping status: Never Used  Substance and Sexual Activity   Alcohol use: Not Currently   Drug use: Never   Sexual activity: Not Currently    Birth control/protection: Post-menopausal    Comment: lives with son and friend, no dietary restrictions  Other Topics Concern   Not on file  Social History Narrative   Right handed   No caffeine   One story home   Social Drivers of Health   Financial Resource Strain: Low  Risk  (05/26/2024)   Overall Financial Resource Strain (CARDIA)    Difficulty of Paying Living Expenses: Not very hard  Food Insecurity: No Food Insecurity (05/26/2024)   Hunger Vital Sign    Worried About Running Out of Food in the Last Year: Never true    Ran Out of Food in the Last Year: Never true  Transportation Needs: No Transportation Needs (05/26/2024)   PRAPARE - Administrator, Civil Service (Medical): No    Lack of Transportation (Non-Medical): No  Physical Activity: Sufficiently Active (05/26/2024)   Exercise Vital Sign    Days of Exercise per Week: 7 days    Minutes of Exercise per Session: 60 min  Stress: No Stress Concern Present (05/26/2024)   [employer] of Occupational Health - Occupational Stress Questionnaire    Feeling of Stress: Not at all  Social Connections: Socially Isolated (05/26/2024)   Social Connection and Isolation Panel    Frequency of Communication with Friends and Family: More than three times a week    Frequency of Social Gatherings with Friends and Family: More than three times a week    Attends Religious Services: Never    Database Administrator or Organizations: No    Attends Banker Meetings: Never    Marital Status: Widowed    Tobacco Counseling Counseling given: Not Answered Tobacco comments: Quit smoking 26 years ago    Clinical Intake:  Pre-visit preparation completed:  Yes  Pain : No/denies pain     BMI - recorded: 32.08 Nutritional Status: BMI > 30  Obese Nutritional Risks: None Diabetes: No  Lab Results  Component Value Date   HGBA1C 5.3 11/08/2023   HGBA1C 5.4 01/03/2023   HGBA1C 5.6 08/14/2022     How often do you need to have someone help you when you read instructions, pamphlets, or other written materials from your doctor or pharmacy?: 1 - Never What is the last grade level you completed in school?: 8th  Interpreter Needed?: No  Information entered by :: Lolita Libra,  CMA(AAMA)   Activities of Daily Living     05/26/2024    1:43 PM  In your present state of health, do you have any difficulty performing the following activities:  Hearing? 1  Vision? 0  Difficulty concentrating or making decisions? 0  Walking or climbing stairs? 0  Dressing or bathing? 0  Doing errands, shopping? 0  Preparing Food and eating ? N  Using the Toilet? N  In the past six months, have you accidently leaked urine? Y  Comment once when oversleeping  Do you have problems with loss of bowel control? N  Managing your Medications? N  Managing your Finances? N  Housekeeping or managing your Housekeeping? N    Patient Care Team: Domenica Harlene LABOR, MD as PCP - General (Family Medicine) Revankar, Jennifer SAUNDERS, MD as PCP - Cardiology (Cardiology) Jude Harden GAILS, MD as Consulting Physician (Pulmonary Disease) Rayburn Pac, MD as Consulting Physician (Nephrology) Tat, Asberry RAMAN, DO as Consulting Physician (Neurology) Beverlee Modesto GAILS, MD as Consulting Physician (Ophthalmology)  I have updated your Care Teams any recent Medical Services you may have received from other providers in the past year.     Assessment:   This is a routine wellness examination for [facility].  Hearing/Vision screen Hearing Screening - Comments:: Notes decrease in hearing. Vision Screening - Comments:: Not up to date due to cost on injections that are recommended.   Goals Addressed             This Visit's Progress    Exercise 3x per week (30 min per time)   On track      Depression Screen     05/26/2024    1:47 PM 04/16/2024   11:07 AM 01/28/2024   11:32 AM 05/02/2023    2:43 PM 08/14/2022    1:18 PM 04/19/2022    3:55 PM 04/11/2022   11:27 AM  PHQ 2/9 Scores  PHQ - 2 Score 0 0 0 0 0 0 0  PHQ- 9 Score 3 2 0        Fall Risk     05/26/2024    1:54 PM 04/16/2024   11:07 AM 02/08/2024    9:38 AM 08/06/2023    2:00 PM 05/02/2023    2:27 PM  Fall Risk   Falls in the past year? 1 1 0  0 1  Number falls in past yr: 0 0 0 0 1  Injury with Fall? 1 0 0 0 1  Risk for fall due to : Impaired balance/gait;Impaired vision No Fall Risks   History of fall(s)  Follow up Education provided Falls evaluation completed Falls evaluation completed Falls evaluation completed Falls evaluation completed    MEDICARE RISK AT HOME:  Medicare Risk at Home Any stairs in or around the home?: Yes If so, are there any without handrails?: No Home free of loose throw rugs in walkways, pet beds,  electrical cords, etc?: Yes Adequate lighting in your home to reduce risk of falls?: Yes Life alert?: No Use of a cane, walker or w/c?: No Grab bars in the bathroom?: No Shower chair or bench in shower?: Yes Elevated toilet seat or a handicapped toilet?: No  TIMED UP AND GO:  Was the test performed?  Yes  Length of time to ambulate 10 feet: 10 sec Gait slow and steady without use of assistive device  Cognitive Function: 6CIT completed    09/18/2017    2:49 PM 09/14/2016    3:10 PM  MMSE - Mini Mental State Exam  Orientation to time 5  5   Orientation to Place 5  5   Registration 3  3   Attention/ Calculation 5  5   Recall 2  3   Language- name 2 objects 2  2   Language- repeat 1 1  Language- follow 3 step command 3  3   Language- read & follow direction 1  1   Write a sentence 1  1   Copy design 1  1   Total score 29  30      Data saved with a previous flowsheet row definition        05/26/2024    1:56 PM 05/02/2023    2:44 PM 04/19/2022    3:57 PM  6CIT Screen  What Year? 0 points 0 points 0 points  What month? 0 points 0 points 0 points  What time? 0 points 3 points 0 points  Count back from 20 0 points 0 points 0 points  Months in reverse 0 points 0 points 0 points  Repeat phrase 4 points 6 points 0 points  Total Score 4 points 9 points 0 points    Immunizations Immunization History  Administered Date(s) Administered   Fluad Quad(high Dose 65+) 04/17/2019, 04/14/2020,  04/11/2022   INFLUENZA, HIGH DOSE SEASONAL PF 03/30/2016, 04/19/2017, 05/02/2018, 04/30/2023, 04/16/2024   Influenza Split 04/29/2013   Influenza,inj,Quad PF,6+ Mos 03/26/2014, 05/17/2015   Influenza-Unspecified 06/14/2021   PFIZER(Purple Top)SARS-COV-2 Vaccination 10/19/2019, 11/10/2019, 05/08/2020, 10/30/2020   Pfizer Covid-19 Vaccine Bivalent Booster 57yrs & up 05/18/2021   Pneumococcal Conjugate-13 04/29/2013   Pneumococcal Polysaccharide-23 04/30/2014, 04/17/2019   Respiratory Syncytial Virus Vaccine,Recomb Aduvanted(Arexvy) 06/14/2022   Tdap 05/26/2013   Zoster Recombinant(Shingrix) 04/27/2017, 06/13/2017   Zoster, Live 07/31/2012    Screening Tests Health Maintenance  Topic Date Due   DTaP/Tdap/Td (2 - Td or Tdap) 05/27/2023   Medicare Annual Wellness (AWV)  05/01/2024   Colonoscopy  02/13/2025   Pneumococcal Vaccine: 50+ Years  Completed   Influenza Vaccine  Completed   DEXA SCAN  Completed   Hepatitis C Screening  Completed   Zoster Vaccines- Shingrix  Completed   Meningococcal B Vaccine  Aged Out   Mammogram  Discontinued   COVID-19 Vaccine  Discontinued    Health Maintenance Items Addressed: Bone Density ordered. Will get tetanus at pharmacy.  Additional Screening:  Vision Screening: Recommended annual ophthalmology exams for early detection of glaucoma and other disorders of the eye. Is the patient up to date with their annual eye exam?  No  Who is the provider or what is the name of the office in which the patient attends annual eye exams? List Provided  Dental Screening: Recommended annual dental exams for proper oral hygiene  Community Resource Referral / Chronic Care Management: CRR required this visit?  No   CCM required this visit?  No   Plan:  I have personally reviewed and noted the following in the patient's chart:   Medical and social history Use of alcohol, tobacco or illicit drugs  Current medications and supplements including opioid  prescriptions. Patient is not currently taking opioid prescriptions. Functional ability and status Nutritional status Physical activity Advanced directives List of other physicians Hospitalizations, surgeries, and ER visits in previous 12 months Vitals Screenings to include cognitive, depression, and falls Referrals and appointments  In addition, I have reviewed and discussed with patient certain preventive protocols, quality metrics, and best practice recommendations. A written personalized care plan for preventive services as well as general preventive health recommendations were provided to patient.   Lolita Libra, CMA   05/26/2024   After Visit Summary: (In Person-Printed) AVS printed and given to the patient  Notes: Nothing significant to report at this time.

## 2024-05-26 NOTE — Patient Instructions (Addendum)
 Ms. Heyde , Thank you for taking time out of your busy schedule to complete your Annual Wellness Visit with me. I enjoyed our conversation and look forward to speaking with you again next year. I, as well as your care team,  appreciate your ongoing commitment to your health goals. Please review the following plan we discussed and let me know if I can assist you in the future. Your Game plan/ To Do List    Referrals: If you haven't heard from the office you've been referred to, please.  Bone Density ([facility]):  [phone]  Follow up Visits: Next Medicare AWV with our clinical staff:  05/27/25  1pm, in person.  Next Office Visit with your provider: 05/28/24 11am, Harlene Jolly, NP (blood pressure follow up)  08/19/24 2:40pm, Harlene Jolly, NP  Clinician Recommendations:  Aim for 30 minutes of exercise or brisk walking, 6-8 glasses of water, and 5 servings of fruits and vegetables each day.    You will need to get the following vaccines at your local pharmacy: Tetanus     This is a list of the screening recommended for you and due dates:  Health Maintenance  Topic Date Due   DTaP/Tdap/Td vaccine (2 - Td or Tdap) 05/27/2023   Medicare Annual Wellness Visit  05/01/2024   Colon Cancer Screening  02/13/2025   Pneumococcal Vaccine for age over 3  Completed   Flu Shot  Completed   DEXA scan (bone density measurement)  Completed   Hepatitis C Screening  Completed   Zoster (Shingles) Vaccine  Completed   Meningitis B Vaccine  Aged Out   Breast Cancer Screening  Discontinued   COVID-19 Vaccine  Discontinued    Advanced directives: (In Chart) A copy of your advanced directives are scanned into your chart should your provider ever need it. Advance Care Planning is important because it:  [x]  Makes sure you receive the medical care that is consistent with your values, goals, and preferences  [x]  It provides guidance to your family and loved ones and reduces their  decisional burden about whether or not they are making the right decisions based on your wishes.  Follow the link provided in your after visit summary or read over the paperwork we have mailed to you to help you started getting your Advance Directives in place. If you need assistance in completing these, please reach out to us  so that we can help you!  See attachments for Preventive Care and Fall Prevention Tips.

## 2024-05-26 NOTE — Telephone Encounter (Signed)
 Pt had AWV today. Forgot to give her a list of eye doctors in the [place] area. Sent via [employer]

## 2024-05-28 ENCOUNTER — Encounter: Payer: Self-pay | Admitting: Student

## 2024-05-28 ENCOUNTER — Ambulatory Visit: Admitting: Student

## 2024-05-28 VITALS — BP 152/70 | HR 94 | Temp 98.1°F | Resp 18 | Ht 62.0 in | Wt 173.0 lb

## 2024-05-28 DIAGNOSIS — E538 Deficiency of other specified B group vitamins: Secondary | ICD-10-CM

## 2024-05-28 DIAGNOSIS — E782 Mixed hyperlipidemia: Secondary | ICD-10-CM

## 2024-05-28 DIAGNOSIS — I10 Essential (primary) hypertension: Secondary | ICD-10-CM | POA: Diagnosis not present

## 2024-05-28 DIAGNOSIS — E079 Disorder of thyroid, unspecified: Secondary | ICD-10-CM

## 2024-05-28 DIAGNOSIS — N184 Chronic kidney disease, stage 4 (severe): Secondary | ICD-10-CM

## 2024-05-28 DIAGNOSIS — J449 Chronic obstructive pulmonary disease, unspecified: Secondary | ICD-10-CM

## 2024-05-28 DIAGNOSIS — G3184 Mild cognitive impairment, so stated: Secondary | ICD-10-CM | POA: Diagnosis not present

## 2024-05-28 DIAGNOSIS — H6123 Impacted cerumen, bilateral: Secondary | ICD-10-CM

## 2024-05-28 DIAGNOSIS — E559 Vitamin D deficiency, unspecified: Secondary | ICD-10-CM

## 2024-05-28 HISTORY — DX: Impacted cerumen, bilateral: H61.23

## 2024-05-28 HISTORY — DX: Deficiency of other specified B group vitamins: E53.8

## 2024-05-28 LAB — VITAMIN D 25 HYDROXY (VIT D DEFICIENCY, FRACTURES): VITD: 63.11 ng/mL (ref 30.00–100.00)

## 2024-05-28 LAB — CBC
HCT: 37.8 % (ref 36.0–46.0)
Hemoglobin: 12.5 g/dL (ref 12.0–15.0)
MCHC: 33.1 g/dL (ref 30.0–36.0)
MCV: 90.7 fl (ref 78.0–100.0)
Platelets: 274 K/uL (ref 150.0–400.0)
RBC: 4.17 Mil/uL (ref 3.87–5.11)
RDW: 15.1 % (ref 11.5–15.5)
WBC: 5.6 K/uL (ref 4.0–10.5)

## 2024-05-28 LAB — LIPID PANEL
Cholesterol: 125 mg/dL (ref 0–200)
HDL: 48.2 mg/dL (ref >39.00)
LDL Cholesterol: 44 mg/dL (ref 0–99)
NonHDL: 77.03
Total CHOL/HDL Ratio: 3
Triglycerides: 163 mg/dL — ABNORMAL HIGH (ref 0.0–149.0)
VLDL: 32.6 mg/dL (ref 0.0–40.0)

## 2024-05-28 LAB — COMPLETE METABOLIC PANEL WITHOUT GFR
AG Ratio: 1.8 (calc) (ref 1.0–2.5)
ALT: 15 U/L (ref 6–29)
AST: 21 U/L (ref 10–35)
Albumin: 4.2 g/dL (ref 3.6–5.1)
Alkaline phosphatase (APISO): 55 U/L (ref 37–153)
BUN/Creatinine Ratio: 18 (calc) (ref 6–22)
BUN: 24 mg/dL (ref 7–25)
CO2: 28 mmol/L (ref 20–32)
Calcium: 9.9 mg/dL (ref 8.6–10.4)
Chloride: 106 mmol/L (ref 98–110)
Creat: 1.35 mg/dL — ABNORMAL HIGH (ref 0.60–1.00)
Globulin: 2.4 g/dL (ref 1.9–3.7)
Glucose, Bld: 69 mg/dL (ref 65–99)
Potassium: 4.5 mmol/L (ref 3.5–5.3)
Sodium: 141 mmol/L (ref 135–146)
Total Bilirubin: 0.4 mg/dL (ref 0.2–1.2)
Total Protein: 6.6 g/dL (ref 6.1–8.1)

## 2024-05-28 LAB — TSH: TSH: 1.43 u[IU]/mL (ref 0.35–5.50)

## 2024-05-28 LAB — VITAMIN B12: Vitamin B-12: >1500 pg/mL — ABNORMAL HIGH (ref 211–911)

## 2024-05-28 MED ORDER — AMLODIPINE BESYLATE 2.5 MG PO TABS: 2.5000 mg | ORAL_TABLET | Freq: Every day | ORAL | 0 refills | Status: DC

## 2024-05-28 NOTE — Progress Notes (Addendum)
 Subjective:     Patient ID: Emily Mitchell, female    DOB: [birth year], 78 y.o.   MRN: [id number]  Chief Complaint  Patient presents with   Follow-up    Elevated outside BP reading   Discussed the use of AI scribe software for clinical note transcription with the patient, who gave verbal consent to proceed.  HPI Emily Mitchell is a 78 year old female presents for follow-up chronic conditions. Presents with daughter, Silvano to office visit.  History of Present Illness Emily Mitchell is a 78 year old female with hypertension who presents for blood pressure management.  She has experienced elevated blood pressure readings during recent clinic visits, including a pulmonary appointment. She is not monitoring her blood pressure at home.  Her current medications include clonazepam  as needed, mostly once a day, and occasionally one and a half times a day. She also takes vitamin B, vitamin D  (1000 IU daily), fish oil, and calcium  supplements. There have been no recent changes in her medication regimen.  PMHx-history of CAD, atherosclerotic cerebrovascular disease,HTN, mixed dyslipidemia and renal insufficiency.   Follows with nephrology, neurology, cardiology, pulmonology, vascular surgery  HTN-metoprolol  25 mg daily HLD-atorvastatin  10 mg daily, fenofibrate  135 mg daily, Zetia  10 mg daily CKD-follows with [facility]- Dr. Fairy Shingles Anxiety- clonazepam  1 tablet 3 times daily as needed Vitamin D  deficiency-1000 units daily Vitamin B12 deficiency-500 mcg daily COPD-follows with pulmonology- Madelin Stank, NP Chronic pain- knees-gabapentin  100 mg at bedtime Hypothyroidism-levothyroxine  100 mcg daily Allergies-Singulair  10 mg at bedtime Essential tremor-follows with neurology -Dr. Evonnie  History of Present Illness Emily Mitchell is a 78 year old female who presents for a follow-up visit.    Patient denies fever, chills, SOB, CP, palpitations, dyspnea, edema, HA, vision  changes, N/V/D, abdominal pain, urinary symptoms, rash, weight changes, and recent illness or hospitalizations.     Health Maintenance Due  Topic Date Due   DTaP/Tdap/Td (2 - Td or Tdap) 05/27/2023    Past Medical History:  Diagnosis Date   Acid reflux    Allergic rhinitis 05/19/2022   Anemia 07/05/2014   Anxiety and depression 12/14/2008   Qualifier: Diagnosis of  By: Gerlean LPN, Mliss     Arthritis    fingers, toes, knees, joints (07/18/2018)   Asthma    Atherosclerotic heart disease of native coronary artery without angina pectoris 07/19/2018   Atypical chest pain 05/23/2015   Back pain 02/02/2019   Breast mass, left 04/28/2019   CAD in native artery 07/19/2018   Candidal skin infection 02/28/2018   Carotid artery disease 06/16/2015   S/p CEA    CHF (congestive heart failure) (HCC)    Chicken pox as a child   Chronic obstructive pulmonary disease (HCC) 12/14/2008   Formatting of this note might be different from the original. Overview:  Spirometry 05/12/2013: FEV1 72% predicted FEV1 FVC ratio 73 predicted FEF 25 75  43% predicted  Last Assessment & Plan:  OK to stop taking advair Use albuterol  as needed only Check oxygen  levels during sleep - we will call you with results Call if symptoms worse   Chronic rhinitis 06/14/2018   CKD (chronic kidney disease), stage IV (HCC)    Follows with [facility], Dr Fairy Shingles    Congestive heart failure (HCC) 11/30/2015   Formatting of this note might be different from the original. Last Assessment & Plan:  No recent exacerbation   Constipation 12/17/2008   Qualifier: Diagnosis of  By: Ezzard DEVONNA Sonny CANDIE  COPD with asthma (HCC) 11/06/2017   Dry eyes 11/30/2015   Essential hypertension 10/18/2015   Formatting of this note might be different from the original. Last Assessment & Plan:  poorlycontrolled and under a great deal of stress, add Metoprolol  XL 25 mg daily. Encouraged heart healthy diet such as the DASH diet and  exercise as tolerated.   Essential tremor 11/05/2013   Exudative age-related macular degeneration of right eye with active choroidal neovascularization (HCC) 08/26/2015   Gastro-esophageal reflux disease without esophagitis 12/14/2008   Formatting of this note might be different from the original. Formatting of this note might be different from the original. Formatting of this note might be different from the original. Overview:  Qualifier: Diagnosis of  By: Gerlean LPN, Mliss   Last Assessment & Plan:  Avoid offending foods, start probiotics. Do not eat large meals in late evening and consider raising head of bed. Last Assessment   GERD (gastroesophageal reflux disease)    H. pylori infection 10/19/2013   H/O multiple allergies 07/14/2018   Helicobacter pylori (H. pylori) as the cause of diseases classified elsewhere 10/19/2013   Formatting of this note might be different from the original. Last Assessment & Plan:  Tetracycline , Pepto Bismol, Flagyl  and Omeprazole .   Hepatitis 1970s   don't know for sure which one it was; think it was A (07/18/2018)   History of food allergy  08/14/2018   History of pulmonary embolism 07/17/2018   Hoarseness 03/09/2019   Hypercalcemia 03/09/2019   Hyperglycemia 03/14/2016   Hyperlipidemia, mixed 05/26/2013   Crestor caused N/V Did not tolerate Lipitor    Knee pain, bilateral 12/17/2012   Left hip pain 05/23/2015   Low back pain 09/19/2015   Lower urinary tract infectious disease 10/19/2013   Lymphadenopathy 10/18/2017   Macular degeneration of both eyes 12/16/2015   Right eye is wet Left Eye is dry Shots every 11 to 12 weeks in right eye at opthamologist office   Medicare annual wellness visit, subsequent 02/21/2015   Mixed anxiety depressive disorder 12/14/2008   Formatting of this note might be different from the original. Overview:  Qualifier: Diagnosis of  By: Gerlean LPN, Mliss  Patient had increased  Tremors on Bupropion   Last Assessment & Plan:  Is  struggling with the stress of her 47 year old granddaughter whom she raised moving back with her own mother, the patient is estranged from her daughter so she is very upset. Increase Citalopram  40 mg daily    Mumps 78 yrs old   Neck pain 04/22/2017   Numbness of finger 12/17/2012   Occlusion and stenosis of carotid artery without mention of cerebral infarction 12/17/2012   OSA (obstructive sleep apnea) 12/14/2008   PSG  06/2013-AHI 6/h 08/2011 (bethany) AHI 4/h No longer needs CPAP    Preventative health care 03/14/2016   Pulmonary emboli (HCC) 05/26/2013   Recurrent infection of skin 07/19/2021   Rheumatoid factor positive 09/27/2021   Right shoulder pain 10/18/2017   Sensation of foreign body in larynx 04/08/2019   Skin lesion of back 03/27/2021   Sleep apnea    don't use CPAP anymore; dr said I didn't have to (07/18/2018)   Static tremor 11/05/2013   Formatting of this note might be different from the original. Last Assessment & Plan:  Follows with [facility]   Statin intolerance 09/29/2019   Stroke [facility])    been told I've had some strokes; didn't know I'd had them (07/18/2018)   Syncope 06/11/2023   Thyroid  disease  Vitamin D  deficiency 03/03/2018   Vitreomacular adhesion of both eyes 01/07/2019   Formatting of this note might be different from the original. no traction    Past Surgical History:  Procedure Laterality Date   APPENDECTOMY  1984   CARDIAC CATHETERIZATION  ~ 2006   CAROTID ANGIOGRAPHY Left 08/20/2018   Procedure: CAROTID ANGIOGRAPHY;  Surgeon: Serene Gaile ORN, MD;  Location: [facility];  Service: Cardiovascular;  Laterality: Left;   CAROTID ENDARTERECTOMY Right 05/10/2007   cea   CATARACT EXTRACTION Left    CATARACT EXTRACTION W/ INTRAOCULAR LENS  IMPLANT, BILATERAL Bilateral    COLONOSCOPY  09/25/2008   normal   COLONOSCOPY WITH PROPOFOL   02/13/2022   [facility]   CORONARY ANGIOPLASTY WITH STENT PLACEMENT  07/18/2018   CORONARY  PRESSURE/FFR STUDY N/A 07/18/2018   Procedure: INTRAVASCULAR PRESSURE WIRE/FFR STUDY;  Surgeon: Court Dorn PARAS, MD;  Location: [facility];  Service: Cardiovascular;  Laterality: N/A;   CORONARY STENT INTERVENTION N/A 07/18/2018   Procedure: CORONARY STENT INTERVENTION;  Surgeon: Court Dorn PARAS, MD;  Location: [facility];  Service: Cardiovascular;  Laterality: N/A;   JOINT REPLACEMENT     LEFT HEART CATH AND CORONARY ANGIOGRAPHY N/A 07/18/2018   Procedure: LEFT HEART CATH AND CORONARY ANGIOGRAPHY;  Surgeon: Court Dorn PARAS, MD;  Location: [facility];  Service: Cardiovascular;  Laterality: N/A;   MULTIPLE TOOTH EXTRACTIONS     TOTAL KNEE ARTHROPLASTY Bilateral 2005 - 2011   right - left   VAGINAL HYSTERECTOMY  1984   WISDOM TOOTH EXTRACTION      Family History  Problem Relation Age of Onset   Stroke Mother        mini stroke   Kidney disease Mother    Heart failure Mother    Hypertension Mother    Diabetes Sister        type 2   Kidney disease Sister    Allergic rhinitis Sister    Osteoarthritis Sister    Hyperlipidemia Sister    Heart attack Maternal Grandfather    Colon cancer Neg Hx    Stomach cancer Neg Hx    Rectal cancer Neg Hx     Social History   Socioeconomic History   Marital status: Divorced    Spouse name: Not on file   Number of children: 2   Years of education: Not on file   Highest education level: 8th grade  Occupational History   Not on file  Tobacco Use   Smoking status: Former    Current packs/day: 0.00    Average packs/day: 2.0 packs/day for 30.0 years (60.0 ttl pk-yrs)    Types: Cigarettes    Start date: 12/02/1964    Quit date: 12/03/1994    Years since quitting: 29.5   Smokeless tobacco: Never   Tobacco comments:    Quit smoking 26 years ago  Vaping Use   Vaping status: Never Used  Substance and Sexual Activity   Alcohol use: Not Currently   Drug use: Never   Sexual activity: Not Currently    Birth  control/protection: Post-menopausal    Comment: lives with son and friend, no dietary restrictions  Other Topics Concern   Not on file  Social History Narrative   Right handed   No caffeine   One story home   Social Drivers of Health   Financial Resource Strain: Low Risk  (05/26/2024)   Overall Financial Resource Strain (CARDIA)    Difficulty  of Paying Living Expenses: Not very hard  Food Insecurity: No Food Insecurity (05/26/2024)   Hunger Vital Sign    Worried About Running Out of Food in the Last Year: Never true    Ran Out of Food in the Last Year: Never true  Transportation Needs: No Transportation Needs (05/26/2024)   PRAPARE - Administrator, Civil Service (Medical): No    Lack of Transportation (Non-Medical): No  Physical Activity: Sufficiently Active (05/26/2024)   Exercise Vital Sign    Days of Exercise per Week: 7 days    Minutes of Exercise per Session: 60 min  Stress: No Stress Concern Present (05/26/2024)   [employer] of Occupational Health - Occupational Stress Questionnaire    Feeling of Stress: Not at all  Social Connections: Socially Isolated (05/26/2024)   Social Connection and Isolation Panel    Frequency of Communication with Friends and Family: More than three times a week    Frequency of Social Gatherings with Friends and Family: More than three times a week    Attends Religious Services: Never    Database Administrator or Organizations: No    Attends Banker Meetings: Never    Marital Status: Widowed  Intimate Partner Violence: Not At Risk (05/26/2024)   Humiliation, Afraid, Rape, and Kick questionnaire    Fear of Current or Ex-Partner: No    Emotionally Abused: No    Physically Abused: No    Sexually Abused: No    Outpatient Medications Prior to Visit  Medication Sig Dispense Refill   acetaminophen  (TYLENOL ) 500 MG tablet Take 500 mg by mouth as needed for headache.     albuterol  (VENTOLIN  HFA) 108 (90 Base)  MCG/ACT inhaler Inhale 2 puffs into the lungs every 6 (six) hours as needed for wheezing or shortness of breath. 18 g 5   aspirin  EC 81 MG tablet Take 1 tablet (81 mg total) by mouth daily. 90 tablet 3   atorvastatin  (LIPITOR) 10 MG tablet TAKE 1 TABLET BY MOUTH EVERY DAY 90 tablet 1   Calcium  Citrate-Vitamin D  200-250 MG-UNIT TABS Take 1 tablet by mouth daily.     Cholecalciferol 25 MCG (1000 UT) tablet Take 1,000 Units by mouth daily.     Choline Fenofibrate  (FENOFIBRIC ACID ) 135 MG CPDR Take 1 capsule (135 mg total) by mouth daily. 90 capsule 1   clonazePAM  (KLONOPIN ) 1 MG tablet Take 1 mg by mouth 3 (three) times daily as needed for anxiety.     cyanocobalamin  100 MCG tablet Take 500 mcg by mouth daily.     esomeprazole  (NEXIUM ) 40 MG capsule TAKE 1 CAPSULE (40 MG TOTAL) BY MOUTH DAILY. 90 capsule 1   ezetimibe  (ZETIA ) 10 MG tablet TAKE 1 TABLET BY MOUTH EVERY DAY 90 tablet 1   FLUoxetine  (PROZAC ) 20 MG capsule Take 1 capsule (20 mg total) by mouth daily. 90 capsule 1   fluticasone -salmeterol (WIXELA INHUB) 250-50 MCG/ACT AEPB Inhale 1 puff into the lungs in the morning and at bedtime. 1 each 5   gabapentin  (NEURONTIN ) 100 MG capsule Take 2 capsules (200 mg total) by mouth at bedtime. 180 capsule 0   levothyroxine  (SYNTHROID ) 100 MCG tablet TAKE 1 TABLET BY MOUTH EVERY DAY 90 tablet 2   metoprolol  succinate (TOPROL -XL) 25 MG 24 hr tablet TAKE 1 TABLET (25 MG TOTAL) BY MOUTH DAILY. 90 tablet 1   montelukast  (SINGULAIR ) 10 MG tablet TAKE 1 TABLET BY MOUTH AT BEDTIME AS NEEDED. 90 tablet 3  NON FORMULARY Take 1 tablet by mouth daily. Digestive advantage.     primidone  (MYSOLINE ) 50 MG tablet Take 1 tablet (50 mg total) by mouth 2 (two) times daily. 180 tablet 2   No facility-administered medications prior to visit.    Allergies  Allergen Reactions   Niaspan [Niacin Er (Antihyperlipidemic)] Nausea And Vomiting and Swelling    Swelling in mouth   Pantoprazole      Mouth sores    Rosuvastatin Calcium  Anaphylaxis   Sulfonamide Derivatives Hives   Bupropion  Other (See Comments)    Uncontrollable shakes   Nitroglycerin      NOT allergic - cardiology has recommended she NOT use this due to h/o severe carotid disease as it may decrease cerebral perfusion   Penicillins Hives    DID THE REACTION INVOLVE: Swelling of the face/tongue/throat, SOB, or low BP? Yes Sudden or severe rash/hives, skin peeling, or the inside of the mouth or nose? Unknown Did it require medical treatment? Unknown When did it last happen?   [age]  If all above answers are "NO", may proceed with cephalosporin use.   Statins    Apple Rash   Apple Juice Rash   Banana Rash    ROS   See HPI Objective:    Physical Exam  General: No acute distress. Awake and conversant.  Eyes: Normal conjunctiva, anicteric. Round symmetric pupils.  ENT: Hearing grossly intact. No nasal discharge.  Neck: Neck is supple. No masses or thyromegaly.  Respiratory: CTAB. Respirations are non-labored. No wheezing.  Skin: Warm. No rashes or ulcers.  Psych: Alert and oriented. Cooperative, Appropriate mood and affect,  CV: RRR. No murmur. No lower extremity edema.  MSK: Normal ambulation. No clubbing or cyanosis.  Neuro:  CN II-XII grossly normal.    BP (!) 152/70   Pulse 94   Temp 98.1 F (36.7 C)   Resp 18   Ht 5' 2 (1.575 m)   Wt 173 lb (78.5 kg)   SpO2 98%   PF 96 L/min   BMI 31.64 kg/m  Wt Readings from Last 3 Encounters:  05/28/24 173 lb (78.5 kg)  05/26/24 175 lb 6.4 oz (79.6 kg)  04/21/24 176 lb (79.8 kg)       Assessment & Plan:   Problem List Items Addressed This Visit     B12 deficiency   Supplement and monitor      Relevant Orders   Vitamin B12   Chronic obstructive pulmonary disease (HCC)   Denies recent exacerbations.  Follows with pulmonology.      CKD (chronic kidney disease), stage IV (HCC)   Hydrate and monitor. Follows with [facility], Dr Fairy Shingles         Relevant Orders   Lipid panel   Essential hypertension   Poorly controlled.  Encourage patient to monitor blood pressure at home and return to clinic if BP > 140/90.  -Rx-start amlodipine  2.5 mg daily, continue metoprolol  -RTC 4 weeks blood pressure recheck nurse visit -Follow up with annual cardiology       Relevant Medications   amLODipine  (NORVASC ) 2.5 MG tablet   Other Relevant Orders   COMPLETE METABOLIC PANEL WITH eGFR   CBC   TSH   Hearing loss due to cerumen impaction, bilateral   Hyperlipidemia, mixed   Tolerating statin, encouraged heart healthy diet, avoid trans fats, minimize simple carbs and saturated fats. Increase exercise as tolerated.  Update lipid panel.        Relevant Medications   amLODipine  (NORVASC ) 2.5  MG tablet   Other Relevant Orders   Lipid panel   Mild cognitive impairment - Primary   Thyroid  disease   On levothyroxine .  Continue to monitor.      Vitamin D  deficiency   Relevant Orders   Vitamin D  (25 hydroxy)   Hearing loss, bilateral cerumen impaction May be related to cerumen impaction. Right ear has more wax accumulation. - Performed ear irrigation during OV Procedure: Ear irrigation performed using warm water by CMA, Amber. Some cerumen successfully cleared without complications, and the patient tolerated the procedure well. -Residual cerumen noted deep in the ear canal and unable to be cleared fully. Will refer to ENT for complete removal. - Consider referral for hearing testing if irrigation does not improve hearing.    I am having Emily EMERSON Mitchell start on amLODipine . I am also having her maintain her aspirin  EC, acetaminophen , Calcium  Citrate-Vitamin D , Cholecalciferol, cyanocobalamin , NON FORMULARY, esomeprazole , metoprolol  succinate, primidone , albuterol , montelukast , ezetimibe , atorvastatin , levothyroxine , clonazePAM , fluticasone -salmeterol, FLUoxetine , gabapentin , and Fenofibric Acid .  Meds ordered this encounter  Medications    amLODipine  (NORVASC ) 2.5 MG tablet    Sig: Take 1 tablet (2.5 mg total) by mouth daily.    Dispense:  90 tablet    Refill:  0    Supervising Provider:   DOMENICA BLACKBIRD A [[id number]]

## 2024-05-28 NOTE — Addendum Note (Signed)
 Addended by: WHEELER HARLENE CROME on: 05/28/2024 12:03 PM   Modules accepted: Orders

## 2024-05-28 NOTE — Assessment & Plan Note (Addendum)
 Poorly controlled.  Encourage patient to monitor blood pressure at home and return to clinic if BP > 140/90.  -Rx-start amlodipine  2.5 mg daily, continue metoprolol  -RTC 4 weeks blood pressure recheck nurse visit -Follow up with annual cardiology

## 2024-05-28 NOTE — Assessment & Plan Note (Signed)
 Hydrate and monitor. Follows with [facility], Dr Fairy Shingles

## 2024-05-28 NOTE — Assessment & Plan Note (Signed)
 Supplement and monitor

## 2024-05-28 NOTE — Assessment & Plan Note (Signed)
 On levothyroxine .  Continue to monitor.

## 2024-05-28 NOTE — Assessment & Plan Note (Signed)
 Tolerating statin, encouraged heart healthy diet, avoid trans fats, minimize simple carbs and saturated fats. Increase exercise as tolerated.  Update lipid panel.

## 2024-05-28 NOTE — Assessment & Plan Note (Signed)
 Denies recent exacerbations.  Follows with pulmonology.

## 2024-05-29 ENCOUNTER — Ambulatory Visit: Payer: Self-pay | Admitting: Student

## 2024-06-02 ENCOUNTER — Telehealth: Payer: Self-pay | Admitting: Family Medicine

## 2024-06-02 ENCOUNTER — Other Ambulatory Visit: Payer: Self-pay | Admitting: Family Medicine

## 2024-06-02 MED ORDER — CLONAZEPAM 1 MG PO TABS: 1.0000 mg | ORAL_TABLET | Freq: Three times a day (TID) | ORAL | 2 refills | Status: DC | PRN

## 2024-06-02 NOTE — Telephone Encounter (Signed)
 Requesting: clonazepam  1mg   Contract:04/16/24 UDS: 04/16/24 Last Visit: 05/28/24 Next Visit: 08/19/24 Last Refill: unknown  Please Advise

## 2024-06-02 NOTE — Telephone Encounter (Signed)
 Copied from CRM #[id number]. Topic: Clinical - Medication Question >> Jun 02, 2024  9:52 AM Alfonso HERO wrote: Reason for CRM: patient calling for status of clonazePAM  (KLONOPIN ) 1 MG tablet she is just about out of the medication.

## 2024-06-03 ENCOUNTER — Telehealth: Payer: Self-pay

## 2024-06-03 ENCOUNTER — Other Ambulatory Visit: Payer: Self-pay | Admitting: Family Medicine

## 2024-06-03 MED ORDER — CLONAZEPAM 1 MG PO TABS: 1.0000 mg | ORAL_TABLET | Freq: Three times a day (TID) | ORAL | 1 refills | Status: DC | PRN

## 2024-06-03 NOTE — Telephone Encounter (Signed)
 Copied from CRM #[id number]. Topic: Clinical - Medication Question >> Jun 02, 2024  5:38 PM Dedra B wrote: Reason for CRM: Pt wants to know why prescription for clonazepam  was a 10 day supply instead of a 30 day supply. Pls call pt at [phone]

## 2024-06-03 NOTE — Telephone Encounter (Signed)
 Patient was advised and verbalized understanding.

## 2024-06-03 NOTE — Telephone Encounter (Signed)
 Returned patients call and she stated that Dr. Domenica usually send in 90 tablets and it last her a longer time then the 30 days and she is afraid that she may run out the medication sooner. Patient would like to know if she could have the 90 tablets send into the pharmacy for next refill. Please advise.

## 2024-06-12 ENCOUNTER — Telehealth: Payer: Self-pay

## 2024-06-12 NOTE — Telephone Encounter (Signed)
 Copied from CRM [id number]. Topic: General - Other >> Jun 12, 2024  1:56 PM Aisha D wrote: Reason for CRM: Pt stated that her kidney provider is leaving the office and wants to know if Dr.Blyth has any recommendations for a kidney specialist. Pt would like to see a specialist close to [place] and would like a callback with an update.

## 2024-06-13 NOTE — Telephone Encounter (Signed)
 Returned pt's call and no answer left message to return call.

## 2024-06-13 NOTE — Telephone Encounter (Unsigned)
 Copied from CRM [id number]. Topic: Referral - Question >> Jun 13, 2024  4:25 PM Tinnie BROCKS wrote: Reason for CRM: FYI- Returning call to [employer], Cherise BROCKS, CMA about finding a kidney doctor near her. She will look into it to try to find a closer kidney specialist and possibly give us  a call back to request referral but might just stick with [facility] since she has been going there about 30 years.

## 2024-07-08 ENCOUNTER — Ambulatory Visit (HOSPITAL_BASED_OUTPATIENT_CLINIC_OR_DEPARTMENT_OTHER)
Admission: RE | Admit: 2024-07-08 | Discharge: 2024-07-08 | Disposition: A | Source: Ambulatory Visit | Attending: Family Medicine | Admitting: Family Medicine

## 2024-07-08 DIAGNOSIS — Z780 Asymptomatic menopausal state: Secondary | ICD-10-CM

## 2024-07-09 ENCOUNTER — Ambulatory Visit: Payer: Self-pay | Admitting: Family Medicine

## 2024-07-09 NOTE — Progress Notes (Signed)
 Called patient and no answer left message to return call.

## 2024-07-18 ENCOUNTER — Other Ambulatory Visit: Payer: Self-pay | Admitting: Family Medicine

## 2024-08-06 ENCOUNTER — Encounter: Payer: Self-pay | Admitting: Cardiology

## 2024-08-06 ENCOUNTER — Ambulatory Visit: Attending: Cardiology | Admitting: Cardiology

## 2024-08-06 VITALS — BP 140/80 | HR 57 | Ht 61.0 in | Wt 174.0 lb

## 2024-08-06 DIAGNOSIS — I2510 Atherosclerotic heart disease of native coronary artery without angina pectoris: Secondary | ICD-10-CM

## 2024-08-06 DIAGNOSIS — I10 Essential (primary) hypertension: Secondary | ICD-10-CM | POA: Diagnosis not present

## 2024-08-06 DIAGNOSIS — Z86711 Personal history of pulmonary embolism: Secondary | ICD-10-CM

## 2024-08-06 DIAGNOSIS — E782 Mixed hyperlipidemia: Secondary | ICD-10-CM | POA: Diagnosis not present

## 2024-08-06 NOTE — Patient Instructions (Signed)

## 2024-08-06 NOTE — Progress Notes (Signed)
 " Cardiology Office Note:    Date:  08/06/2024   ID:  Emily Mitchell, DOB 22-Dec-1945, MRN [id number]  PCP:  Domenica Harlene LABOR, MD  Cardiologist:  Jennifer JONELLE Crape, MD   Referring MD: Domenica Harlene LABOR, MD    ASSESSMENT:    1. Hyperlipidemia, mixed   2. Essential hypertension   3. History of pulmonary embolism   4. CAD in native artery    PLAN:    In order of problems listed above:  Coronary artery disease: Secondary prevention stressed with the patient.  Importance of compliance with diet medication stressed and patient verbalized standing.  Patient was advised to continue exercising and she does this in a very regular fashion. Essential hypertension: Blood pressure is stable and diet was emphasized.  Lifestyle modification urged.  Salt intake issues were discussed.  Her blood pressure at home is fine.  She has an element of whitecoat hypertension. Mixed dyslipidemia: On lipid-lowering medications followed by primary care.  Lipids reviewed and discussed with the patient. Obesity: Weight reduction stressed diet emphasized and she promises to do better. Patient will be seen in follow-up appointment in 6 months or earlier if the patient has any concerns.    Medication Adjustments/Labs and Tests Ordered: Current medicines are reviewed at length with the patient today.  Concerns regarding medicines are outlined above.  Orders Placed This Encounter  Procedures   EKG 12-Lead   No orders of the defined types were placed in this encounter.    No chief complaint on file.    History of Present Illness:    Emily Mitchell is a 79 y.o. female.  Patient has past medical history of coronary artery disease, essential hypertension, mixed dyslipidemia.  She denies any problems at this time and takes care of activities of daily living.  No chest pain orthopnea or PND.  At the time of my evaluation, the patient is alert awake oriented and in no distress.  She uses a exercise bicycle like activity  to keep her self active.  Her daughter accompanies her for this visit.  Past Medical History:  Diagnosis Date   Acid reflux    Allergic rhinitis 05/19/2022   Anemia 07/05/2014   Anxiety and depression 12/14/2008   Qualifier: Diagnosis of  By: Gerlean LPN, Mliss     Arthritis    fingers, toes, knees, joints (07/18/2018)   Asthma    Atherosclerotic heart disease of native coronary artery without angina pectoris 07/19/2018   Atypical chest pain 05/23/2015   B12 deficiency 05/28/2024   Back pain 02/02/2019   Breast mass, left 04/28/2019   CAD in native artery 07/19/2018   Candidal skin infection 02/28/2018   Carotid artery disease 06/16/2015   S/p CEA    Chicken pox as a child   Chronic obstructive pulmonary disease (HCC) 12/14/2008   Formatting of this note might be different from the original. Overview:  Spirometry 05/12/2013: FEV1 72% predicted FEV1 FVC ratio 73 predicted FEF 25 75  43% predicted  Last Assessment & Plan:  OK to stop taking advair Use albuterol  as needed only Check oxygen  levels during sleep - we will call you with results Call if symptoms worse   Chronic rhinitis 06/14/2018   CKD (chronic kidney disease), stage IV (HCC)    Follows with [facility], Dr Fairy Shingles    Congestive heart failure (HCC) 11/30/2015   Formatting of this note might be different from the original. Last Assessment & Plan:  No recent exacerbation  Constipation 12/17/2008   Qualifier: Diagnosis of  By: Ezzard DEVONNA Sonny CANDIE    COPD with asthma (HCC) 11/06/2017   Dry eyes 11/30/2015   Essential hypertension 10/18/2015   Formatting of this note might be different from the original. Last Assessment & Plan:  poorlycontrolled and under a great deal of stress, add Metoprolol  XL 25 mg daily. Encouraged heart healthy diet such as the DASH diet and exercise as tolerated.   Essential tremor 11/05/2013   Exudative age-related macular degeneration of right eye with active choroidal  neovascularization (HCC) 08/26/2015   Fall 04/16/2024   Fall in home 2025     Gastro-esophageal reflux disease without esophagitis 12/14/2008   Formatting of this note might be different from the original. Formatting of this note might be different from the original. Formatting of this note might be different from the original. Overview:  Qualifier: Diagnosis of  By: Gerlean LPN, Mliss   Last Assessment & Plan:  Avoid offending foods, start probiotics. Do not eat large meals in late evening and consider raising head of bed. Last Assessment   H. pylori infection 10/19/2013   H/O multiple allergies 07/14/2018   Hearing loss due to cerumen impaction, bilateral 05/28/2024   Hepatitis 1970s   don't know for sure which one it was; think it was A (07/18/2018)   History of food allergy  08/14/2018   History of pulmonary embolism 07/17/2018   Hoarseness 03/09/2019   Hypercalcemia 03/09/2019   Hyperglycemia 03/14/2016   Hyperlipidemia, mixed 05/26/2013   Crestor caused N/V Did not tolerate Lipitor    Knee pain, bilateral 12/17/2012   Left hip pain 05/23/2015   Low back pain 09/19/2015   Lower urinary tract infectious disease 10/19/2013   Lymphadenopathy 10/18/2017   Macular degeneration of both eyes 12/16/2015   Right eye is wet Left Eye is dry Shots every 11 to 12 weeks in right eye at opthamologist office   Medicare annual wellness visit, subsequent 02/21/2015   Mild cognitive impairment 01/28/2024   Mixed anxiety depressive disorder 12/14/2008   Formatting of this note might be different from the original. Overview:  Qualifier: Diagnosis of  By: Gerlean LPN, Mliss  Patient had increased  Tremors on Bupropion   Last Assessment & Plan:  Is struggling with the stress of her 75 year old granddaughter whom she raised moving back with her own mother, the patient is estranged from her daughter so she is very upset. Increase Citalopram  40 mg daily    Mumps 79 yrs old   Neck pain 04/22/2017   Numbness of  finger 12/17/2012   Occlusion and stenosis of carotid artery without mention of cerebral infarction 12/17/2012   OSA (obstructive sleep apnea) 12/14/2008   PSG  06/2013-AHI 6/h 08/2011 (bethany) AHI 4/h No longer needs CPAP    Preventative health care 03/14/2016   Pulmonary emboli (HCC) 05/26/2013   Recurrent infection of skin 07/19/2021   Rheumatoid factor positive 09/27/2021   Right shoulder pain 10/18/2017   Sensation of foreign body in larynx 04/08/2019   Skin lesion of back 03/27/2021   Sleep apnea    don't use CPAP anymore; dr said I didn't have to (07/18/2018)   Static tremor 11/05/2013   Formatting of this note might be different from the original. Last Assessment & Plan:  Follows with [facility]   Statin intolerance 09/29/2019   Stroke [facility])    been told I've had some strokes; didn't know I'd had them (07/18/2018)   Syncope 06/11/2023   Thyroid  disease  Vitamin D  deficiency 03/03/2018   Vitreomacular adhesion of both eyes 01/07/2019   Formatting of this note might be different from the original. no traction    Past Surgical History:  Procedure Laterality Date   APPENDECTOMY  1984   CARDIAC CATHETERIZATION  ~ 2006   CAROTID ANGIOGRAPHY Left 08/20/2018   Procedure: CAROTID ANGIOGRAPHY;  Surgeon: Serene Gaile ORN, MD;  Location: [facility];  Service: Cardiovascular;  Laterality: Left;   CAROTID ENDARTERECTOMY Right 05/10/2007   cea   CATARACT EXTRACTION Left    CATARACT EXTRACTION W/ INTRAOCULAR LENS  IMPLANT, BILATERAL Bilateral    COLONOSCOPY  09/25/2008   normal   COLONOSCOPY WITH PROPOFOL   02/13/2022   [facility]nie [facility] at [facility]   CORONARY ANGIOPLASTY WITH STENT PLACEMENT  07/18/2018   CORONARY PRESSURE/FFR STUDY N/A 07/18/2018   Procedure: INTRAVASCULAR PRESSURE WIRE/FFR STUDY;  Surgeon: Court Dorn PARAS, MD;  Location: [facility];  Service: Cardiovascular;  Laterality: N/A;   CORONARY STENT INTERVENTION N/A 07/18/2018   Procedure: CORONARY STENT  INTERVENTION;  Surgeon: Court Dorn PARAS, MD;  Location: [facility];  Service: Cardiovascular;  Laterality: N/A;   JOINT REPLACEMENT     LEFT HEART CATH AND CORONARY ANGIOGRAPHY N/A 07/18/2018   Procedure: LEFT HEART CATH AND CORONARY ANGIOGRAPHY;  Surgeon: Court Dorn PARAS, MD;  Location: [facility];  Service: Cardiovascular;  Laterality: N/A;   MULTIPLE TOOTH EXTRACTIONS     TOTAL KNEE ARTHROPLASTY Bilateral 2005 - 2011   right - left   VAGINAL HYSTERECTOMY  1984   WISDOM TOOTH EXTRACTION      Current Medications: Active Medications[1]   Allergies:   Niaspan [niacin er (antihyperlipidemic)], Pantoprazole , Rosuvastatin calcium , Sulfonamide derivatives, Bupropion , Nitroglycerin , Penicillins, Statins, Apple, Apple juice, and Banana   Social History   Socioeconomic History   Marital status: Divorced    Spouse name: Not on file   Number of children: 2   Years of education: Not on file   Highest education level: 8th grade  Occupational History   Not on file  Tobacco Use   Smoking status: Former    Current packs/day: 0.00    Average packs/day: 2.0 packs/day for 30.0 years (60.0 ttl pk-yrs)    Types: Cigarettes    Start date: 12/02/1964    Quit date: 12/03/1994    Years since quitting: 29.6   Smokeless tobacco: Never   Tobacco comments:    Quit smoking 26 years ago  Vaping Use   Vaping status: Never Used  Substance and Sexual Activity   Alcohol use: Not Currently   Drug use: Never   Sexual activity: Not Currently    Birth control/protection: Post-menopausal    Comment: lives with son and friend, no dietary restrictions  Other Topics Concern   Not on file  Social History Narrative   Right handed   No caffeine   One story home   Social Drivers of Health   Tobacco Use: Medium Risk (08/06/2024)   Patient History    Smoking Tobacco Use: Former    Smokeless Tobacco Use: Never    Passive Exposure: Not on Actuary Strain: Low Risk (05/26/2024)    Overall Financial Resource Strain (CARDIA)    Difficulty of Paying Living Expenses: Not very hard  Food Insecurity: No Food Insecurity (05/26/2024)   Epic    Worried About Radiation Protection Practitioner of Food in the Last Year: Never true    Ran Out of Food in the Last Year: Never  true  Transportation Needs: No Transportation Needs (05/26/2024)   Epic    Lack of Transportation (Medical): No    Lack of Transportation (Non-Medical): No  Physical Activity: Sufficiently Active (05/26/2024)   Exercise Vital Sign    Days of Exercise per Week: 7 days    Minutes of Exercise per Session: 60 min  Stress: No Stress Concern Present (05/26/2024)   [employer] of Occupational Health - Occupational Stress Questionnaire    Feeling of Stress: Not at all  Social Connections: Socially Isolated (05/26/2024)   Social Connection and Isolation Panel    Frequency of Communication with Friends and Family: More than three times a week    Frequency of Social Gatherings with Friends and Family: More than three times a week    Attends Religious Services: Never    Database Administrator or Organizations: No    Attends Banker Meetings: Never    Marital Status: Widowed  Depression (PHQ2-9): Low Risk (05/26/2024)   Depression (PHQ2-9)    PHQ-2 Score: 3  Alcohol Screen: Low Risk (05/26/2024)   Alcohol Screen    Last Alcohol Screening Score (AUDIT): 0  Housing: Low Risk (05/26/2024)   Epic    Unable to Pay for Housing in the Last Year: No    Number of Times Moved in the Last Year: 0    Homeless in the Last Year: No  Utilities: Not At Risk (05/26/2024)   Epic    Threatened with loss of utilities: No  Health Literacy: Adequate Health Literacy (05/26/2024)   B1300 Health Literacy    Frequency of need for help with medical instructions: Never     Family History: The patient's family history includes Allergic rhinitis in her sister; Diabetes in her sister; Heart attack in her maternal grandfather; Heart  failure in her mother; Hyperlipidemia in her sister; Hypertension in her mother; Kidney disease in her mother and sister; Osteoarthritis in her sister; Stroke in her mother. There is no history of Colon cancer, Stomach cancer, or Rectal cancer.  ROS:   Please see the history of present illness.    All other systems reviewed and are negative.  EKGs/Labs/Other Studies Reviewed:    The following studies were reviewed today: .SABRA   I discussed my findings with the patient at length   Recent Labs: 05/28/2024: ALT 15; BUN 24; Creat 1.35; Hemoglobin 12.5; Platelets 274.0; Potassium 4.5; Sodium 141; TSH 1.43  Recent Lipid Panel    Component Value Date/Time   CHOL 125 05/28/2024 1224   CHOL 143 04/12/2020 1137   TRIG 163.0 (H) 05/28/2024 1224   HDL 48.20 05/28/2024 1224   HDL 44 04/12/2020 1137   CHOLHDL 3 05/28/2024 1224   VLDL 32.6 05/28/2024 1224   LDLCALC 44 05/28/2024 1224   LDLCALC 71 04/12/2020 1137   LDLDIRECT 103.0 07/14/2019 1323    Physical Exam:    VS:  BP (!) 140/80   Pulse (!) 57   Ht 5' 1 (1.549 m)   Wt 174 lb 0.6 oz (78.9 kg)   SpO2 98%   BMI 32.88 kg/m     Wt Readings from Last 3 Encounters:  08/06/24 174 lb 0.6 oz (78.9 kg)  05/28/24 173 lb (78.5 kg)  05/26/24 175 lb 6.4 oz (79.6 kg)     GEN: Patient is in no acute distress HEENT: Normal NECK: No JVD; No carotid bruits LYMPHATICS: No lymphadenopathy CARDIAC: Hear sounds regular, 2/6 systolic murmur at the apex. RESPIRATORY:  Clear to auscultation without  rales, wheezing or rhonchi  ABDOMEN: Soft, non-tender, non-distended MUSCULOSKELETAL:  No edema; No deformity  SKIN: Warm and dry NEUROLOGIC:  Alert and oriented x 3 PSYCHIATRIC:  Normal affect   Signed, Jennifer JONELLE Crape, MD  08/06/2024 11:51 AM    [facility]     [1]  Current Meds  Medication Sig   acetaminophen  (TYLENOL ) 500 MG tablet Take 500 mg by mouth as needed for headache.   albuterol  (VENTOLIN  HFA) 108 (90  Base) MCG/ACT inhaler Inhale 2 puffs into the lungs every 6 (six) hours as needed for wheezing or shortness of breath.   aspirin  EC 81 MG tablet Take 1 tablet (81 mg total) by mouth daily.   atorvastatin  (LIPITOR) 10 MG tablet TAKE 1 TABLET BY MOUTH EVERY DAY   Calcium  Citrate-Vitamin D  200-250 MG-UNIT TABS Take 1 tablet by mouth daily.   Cholecalciferol 25 MCG (1000 UT) tablet Take 1,000 Units by mouth daily.   Choline Fenofibrate  (FENOFIBRIC ACID ) 135 MG CPDR Take 1 capsule (135 mg total) by mouth daily.   clonazePAM  (KLONOPIN ) 1 MG tablet Take 1 tablet (1 mg total) by mouth 3 (three) times daily as needed for anxiety.   cyanocobalamin  100 MCG tablet Take 500 mcg by mouth daily.   esomeprazole  (NEXIUM ) 40 MG capsule TAKE 1 CAPSULE (40 MG TOTAL) BY MOUTH DAILY.   ezetimibe  (ZETIA ) 10 MG tablet TAKE 1 TABLET BY MOUTH EVERY DAY   fluticasone -salmeterol (WIXELA INHUB) 250-50 MCG/ACT AEPB Inhale 1 puff into the lungs in the morning and at bedtime.   gabapentin  (NEURONTIN ) 100 MG capsule Take 2 capsules (200 mg total) by mouth at bedtime.   levothyroxine  (SYNTHROID ) 100 MCG tablet TAKE 1 TABLET BY MOUTH EVERY DAY   metoprolol  succinate (TOPROL -XL) 25 MG 24 hr tablet TAKE 1 TABLET (25 MG TOTAL) BY MOUTH DAILY.   montelukast  (SINGULAIR ) 10 MG tablet TAKE 1 TABLET BY MOUTH AT BEDTIME AS NEEDED.   NON FORMULARY Take 1 tablet by mouth daily. Digestive advantage.   primidone  (MYSOLINE ) 50 MG tablet Take 1 tablet (50 mg total) by mouth 2 (two) times daily.   "

## 2024-08-15 NOTE — Progress Notes (Unsigned)
 "  Subjective:     Patient ID: Emily Mitchell, female    DOB: May 26, 1946, 79 y.o.   MRN: [id number]  No chief complaint on file.  Discussed the use of AI scribe software for clinical note transcription with the patient, who gave verbal consent to proceed.  *** elevated b12-- how much taking now?  HPI CANYON WILLOW is a 79 year old female presents for follow-up chronic conditions. Presents with daughter, Silvano to office visit.   PMHx-history of CAD, atherosclerotic cerebrovascular disease, Hx of PE, HTN, mixed dyslipidemia and renal insufficiency.   Follows with: nephrology, neurology, cardiology, pulmonology, vascular surgery, ENT   HTN-metoprolol  25 mg daily, *** amlodipine  2.5 mg daily BP at home:   HLD-atorvastatin  10 mg daily, fenofibrate  135 mg daily, Zetia  10 mg daily CKD-follows with [facility]- Dr. Fairy Shingles Anxiety- clonazepam  1 tablet 3 times daily as needed Vitamin D  deficiency-1000 units daily Vitamin B12 deficiency-500 mcg daily COPD-follows with pulmonology- Madelin Stank, NP Chronic pain- knees-gabapentin  100 mg at bedtime Hypothyroidism-levothyroxine  100 mcg daily Allergies-Singulair  10 mg at bedtime Essential tremor-follows with neurology -Dr. Evonnie      Patient denies fever, chills, SOB, CP, palpitations, dyspnea, edema, HA, vision changes, N/V/D, abdominal pain, urinary symptoms, rash, weight changes, and recent illness or hospitalizations.     Health Maintenance Due  Topic Date Due   DTaP/Tdap/Td (2 - Td or Tdap) 05/27/2023    Past Medical History:  Diagnosis Date   Acid reflux    Allergic rhinitis 05/19/2022   Anemia 07/05/2014   Anxiety and depression 12/14/2008   Qualifier: Diagnosis of  By: Gerlean LPN, Mliss     Arthritis    fingers, toes, knees, joints (07/18/2018)   Asthma    Atherosclerotic heart disease of native coronary artery without angina pectoris 07/19/2018   Atypical chest pain 05/23/2015   B12 deficiency  05/28/2024   Back pain 02/02/2019   Breast mass, left 04/28/2019   CAD in native artery 07/19/2018   Candidal skin infection 02/28/2018   Carotid artery disease 06/16/2015   S/p CEA    Chicken pox as a child   Chronic obstructive pulmonary disease (HCC) 12/14/2008   Formatting of this note might be different from the original. Overview:  Spirometry 05/12/2013: FEV1 72% predicted FEV1 FVC ratio 73 predicted FEF 25 75  43% predicted  Last Assessment & Plan:  OK to stop taking advair Use albuterol  as needed only Check oxygen  levels during sleep - we will call you with results Call if symptoms worse   Chronic rhinitis 06/14/2018   CKD (chronic kidney disease), stage IV (HCC)    Follows with [facility], Dr Fairy Shingles    Congestive heart failure (HCC) 11/30/2015   Formatting of this note might be different from the original. Last Assessment & Plan:  No recent exacerbation   Constipation 12/17/2008   Qualifier: Diagnosis of  By: Ezzard DEVONNA Sonny CANDIE    COPD with asthma (HCC) 11/06/2017   Dry eyes 11/30/2015   Essential hypertension 10/18/2015   Formatting of this note might be different from the original. Last Assessment & Plan:  poorlycontrolled and under a great deal of stress, add Metoprolol  XL 25 mg daily. Encouraged heart healthy diet such as the DASH diet and exercise as tolerated.   Essential tremor 11/05/2013   Exudative age-related macular degeneration of right eye with active choroidal neovascularization (HCC) 08/26/2015   Fall 04/16/2024   Fall in home 2025     Gastro-esophageal reflux disease without  esophagitis 12/14/2008   Formatting of this note might be different from the original. Formatting of this note might be different from the original. Formatting of this note might be different from the original. Overview:  Qualifier: Diagnosis of  By: Gerlean LPN, Mliss   Last Assessment & Plan:  Avoid offending foods, start probiotics. Do not eat large meals in late evening and  consider raising head of bed. Last Assessment   H. pylori infection 10/19/2013   H/O multiple allergies 07/14/2018   Hearing loss due to cerumen impaction, bilateral 05/28/2024   Hepatitis 1970s   don't know for sure which one it was; think it was A (07/18/2018)   History of food allergy  08/14/2018   History of pulmonary embolism 07/17/2018   Hoarseness 03/09/2019   Hypercalcemia 03/09/2019   Hyperglycemia 03/14/2016   Hyperlipidemia, mixed 05/26/2013   Crestor caused N/V Did not tolerate Lipitor    Knee pain, bilateral 12/17/2012   Left hip pain 05/23/2015   Low back pain 09/19/2015   Lower urinary tract infectious disease 10/19/2013   Lymphadenopathy 10/18/2017   Macular degeneration of both eyes 12/16/2015   Right eye is wet Left Eye is dry Shots every 11 to 12 weeks in right eye at opthamologist office   Medicare annual wellness visit, subsequent 02/21/2015   Mild cognitive impairment 01/28/2024   Mixed anxiety depressive disorder 12/14/2008   Formatting of this note might be different from the original. Overview:  Qualifier: Diagnosis of  By: Gerlean LPN, Mliss  Patient had increased  Tremors on Bupropion   Last Assessment & Plan:  Is struggling with the stress of her 22 year old granddaughter whom she raised moving back with her own mother, the patient is estranged from her daughter so she is very upset. Increase Citalopram  40 mg daily    Mumps 79 yrs old   Neck pain 04/22/2017   Numbness of finger 12/17/2012   Occlusion and stenosis of carotid artery without mention of cerebral infarction 12/17/2012   OSA (obstructive sleep apnea) 12/14/2008   PSG  06/2013-AHI 6/h 08/2011 (bethany) AHI 4/h No longer needs CPAP    Preventative health care 03/14/2016   Pulmonary emboli (HCC) 05/26/2013   Recurrent infection of skin 07/19/2021   Rheumatoid factor positive 09/27/2021   Right shoulder pain 10/18/2017   Sensation of foreign body in larynx 04/08/2019   Skin lesion of back  03/27/2021   Sleep apnea    don't use CPAP anymore; dr said I didn't have to (07/18/2018)   Static tremor 11/05/2013   Formatting of this note might be different from the original. Last Assessment & Plan:  Follows with [facility]   Statin intolerance 09/29/2019   Stroke [facility])    been told I've had some strokes; didn't know I'd had them (07/18/2018)   Syncope 06/11/2023   Thyroid  disease    Vitamin D  deficiency 03/03/2018   Vitreomacular adhesion of both eyes 01/07/2019   Formatting of this note might be different from the original. no traction    Past Surgical History:  Procedure Laterality Date   APPENDECTOMY  1984   CARDIAC CATHETERIZATION  ~ 2006   CAROTID ANGIOGRAPHY Left 08/20/2018   Procedure: CAROTID ANGIOGRAPHY;  Surgeon: Serene Gaile ORN, MD;  Location: [facility];  Service: Cardiovascular;  Laterality: Left;   CAROTID ENDARTERECTOMY Right 05/10/2007   cea   CATARACT EXTRACTION Left    CATARACT EXTRACTION W/ INTRAOCULAR LENS  IMPLANT, BILATERAL Bilateral    COLONOSCOPY  09/25/2008  normal   COLONOSCOPY WITH PROPOFOL   02/13/2022   [facility]   CORONARY ANGIOPLASTY WITH STENT PLACEMENT  07/18/2018   CORONARY PRESSURE/FFR STUDY N/A 07/18/2018   Procedure: INTRAVASCULAR PRESSURE WIRE/FFR STUDY;  Surgeon: Court Dorn PARAS, MD;  Location: [facility];  Service: Cardiovascular;  Laterality: N/A;   CORONARY STENT INTERVENTION N/A 07/18/2018   Procedure: CORONARY STENT INTERVENTION;  Surgeon: Court Dorn PARAS, MD;  Location: [facility];  Service: Cardiovascular;  Laterality: N/A;   JOINT REPLACEMENT     LEFT HEART CATH AND CORONARY ANGIOGRAPHY N/A 07/18/2018   Procedure: LEFT HEART CATH AND CORONARY ANGIOGRAPHY;  Surgeon: Court Dorn PARAS, MD;  Location: [facility];  Service: Cardiovascular;  Laterality: N/A;   MULTIPLE TOOTH EXTRACTIONS     TOTAL KNEE ARTHROPLASTY Bilateral 2005 - 2011   right - left   VAGINAL HYSTERECTOMY   1984   WISDOM TOOTH EXTRACTION      Family History  Problem Relation Age of Onset   Stroke Mother        mini stroke   Kidney disease Mother    Heart failure Mother    Hypertension Mother    Diabetes Sister        type 2   Kidney disease Sister    Allergic rhinitis Sister    Osteoarthritis Sister    Hyperlipidemia Sister    Heart attack Maternal Grandfather    Colon cancer Neg Hx    Stomach cancer Neg Hx    Rectal cancer Neg Hx     Social History   Socioeconomic History   Marital status: Divorced    Spouse name: Not on file   Number of children: 2   Years of education: Not on file   Highest education level: 8th grade  Occupational History   Not on file  Tobacco Use   Smoking status: Former    Current packs/day: 0.00    Average packs/day: 2.0 packs/day for 30.0 years (60.0 ttl pk-yrs)    Types: Cigarettes    Start date: 12/02/1964    Quit date: 12/03/1994    Years since quitting: 29.7   Smokeless tobacco: Never   Tobacco comments:    Quit smoking 26 years ago  Vaping Use   Vaping status: Never Used  Substance and Sexual Activity   Alcohol use: Not Currently   Drug use: Never   Sexual activity: Not Currently    Birth control/protection: Post-menopausal    Comment: lives with son and friend, no dietary restrictions  Other Topics Concern   Not on file  Social History Narrative   Right handed   No caffeine   One story home   Social Drivers of Health   Tobacco Use: Medium Risk (08/06/2024)   Patient History    Smoking Tobacco Use: Former    Smokeless Tobacco Use: Never    Passive Exposure: Not on Actuary Strain: Low Risk (05/26/2024)   Overall Financial Resource Strain (CARDIA)    Difficulty of Paying Living Expenses: Not very hard  Food Insecurity: No Food Insecurity (05/26/2024)   Epic    Worried About Radiation Protection Practitioner of Food in the Last Year: Never true    Ran Out of Food in the Last Year: Never true  Transportation Needs: No Transportation  Needs (05/26/2024)   Epic    Lack of Transportation (Medical): No    Lack of Transportation (Non-Medical): No  Physical Activity: Sufficiently Active (05/26/2024)   Exercise  Vital Sign    Days of Exercise per Week: 7 days    Minutes of Exercise per Session: 60 min  Stress: No Stress Concern Present (05/26/2024)   [employer] of Occupational Health - Occupational Stress Questionnaire    Feeling of Stress: Not at all  Social Connections: Socially Isolated (05/26/2024)   Social Connection and Isolation Panel    Frequency of Communication with Friends and Family: More than three times a week    Frequency of Social Gatherings with Friends and Family: More than three times a week    Attends Religious Services: Never    Database Administrator or Organizations: No    Attends Banker Meetings: Never    Marital Status: Widowed  Intimate Partner Violence: Not At Risk (05/26/2024)   Epic    Fear of Current or Ex-Partner: No    Emotionally Abused: No    Physically Abused: No    Sexually Abused: No  Depression (PHQ2-9): Low Risk (05/26/2024)   Depression (PHQ2-9)    PHQ-2 Score: 3  Alcohol Screen: Low Risk (05/26/2024)   Alcohol Screen    Last Alcohol Screening Score (AUDIT): 0  Housing: Low Risk (05/26/2024)   Epic    Unable to Pay for Housing in the Last Year: No    Number of Times Moved in the Last Year: 0    Homeless in the Last Year: No  Utilities: Not At Risk (05/26/2024)   Epic    Threatened with loss of utilities: No  Health Literacy: Adequate Health Literacy (05/26/2024)   B1300 Health Literacy    Frequency of need for help with medical instructions: Never    Outpatient Medications Prior to Visit  Medication Sig Dispense Refill   acetaminophen  (TYLENOL ) 500 MG tablet Take 500 mg by mouth as needed for headache.     albuterol  (VENTOLIN  HFA) 108 (90 Base) MCG/ACT inhaler Inhale 2 puffs into the lungs every 6 (six) hours as needed for wheezing or shortness of  breath. 18 g 5   aspirin  EC 81 MG tablet Take 1 tablet (81 mg total) by mouth daily. 90 tablet 3   atorvastatin  (LIPITOR) 10 MG tablet TAKE 1 TABLET BY MOUTH EVERY DAY 90 tablet 1   Calcium  Citrate-Vitamin D  200-250 MG-UNIT TABS Take 1 tablet by mouth daily.     Cholecalciferol 25 MCG (1000 UT) tablet Take 1,000 Units by mouth daily.     Choline Fenofibrate  (FENOFIBRIC ACID ) 135 MG CPDR Take 1 capsule (135 mg total) by mouth daily. 90 capsule 1   clonazePAM  (KLONOPIN ) 1 MG tablet Take 1 tablet (1 mg total) by mouth 3 (three) times daily as needed for anxiety. 90 tablet 1   cyanocobalamin  100 MCG tablet Take 500 mcg by mouth daily.     esomeprazole  (NEXIUM ) 40 MG capsule TAKE 1 CAPSULE (40 MG TOTAL) BY MOUTH DAILY. 90 capsule 1   ezetimibe  (ZETIA ) 10 MG tablet TAKE 1 TABLET BY MOUTH EVERY DAY 90 tablet 1   fluticasone -salmeterol (WIXELA INHUB) 250-50 MCG/ACT AEPB Inhale 1 puff into the lungs in the morning and at bedtime. 1 each 5   gabapentin  (NEURONTIN ) 100 MG capsule Take 2 capsules (200 mg total) by mouth at bedtime. 180 capsule 0   levothyroxine  (SYNTHROID ) 100 MCG tablet TAKE 1 TABLET BY MOUTH EVERY DAY 90 tablet 2   metoprolol  succinate (TOPROL -XL) 25 MG 24 hr tablet TAKE 1 TABLET (25 MG TOTAL) BY MOUTH DAILY. 90 tablet 1   montelukast  (SINGULAIR ) 10 MG  tablet TAKE 1 TABLET BY MOUTH AT BEDTIME AS NEEDED. 90 tablet 3   NON FORMULARY Take 1 tablet by mouth daily. Digestive advantage.     primidone  (MYSOLINE ) 50 MG tablet Take 1 tablet (50 mg total) by mouth 2 (two) times daily. 180 tablet 2   No facility-administered medications prior to visit.    Allergies  Allergen Reactions   Niaspan [Niacin Er (Antihyperlipidemic)] Nausea And Vomiting and Swelling    Swelling in mouth   Pantoprazole      Mouth sores   Rosuvastatin Calcium  Anaphylaxis   Sulfonamide Derivatives Hives   Bupropion  Other (See Comments)    Uncontrollable shakes   Nitroglycerin      NOT allergic - cardiology has  recommended she NOT use this due to h/o severe carotid disease as it may decrease cerebral perfusion   Penicillins Hives    DID THE REACTION INVOLVE: Swelling of the face/tongue/throat, SOB, or low BP? Yes Sudden or severe rash/hives, skin peeling, or the inside of the mouth or nose? Unknown Did it require medical treatment? Unknown When did it last happen?   [age]  If all above answers are NO, may proceed with cephalosporin use.   Statins    Apple Rash   Apple Juice Rash   Banana Rash    ROS   See HPI Objective:    Physical Exam  General: No acute distress. Awake and conversant.  Eyes: Normal conjunctiva, anicteric. Round symmetric pupils.  ENT: Hearing grossly intact. No nasal discharge.  Neck: Neck is supple. No masses or thyromegaly.  Respiratory: CTAB. Respirations are non-labored. No wheezing.  Skin: Warm. No rashes or ulcers.  Psych: Alert and oriented. Cooperative, Appropriate mood and affect,  CV: RRR. No murmur. No lower extremity edema.  MSK: Normal ambulation. No clubbing or cyanosis.  Neuro:  CN II-XII grossly normal.    There were no vitals taken for this visit. Wt Readings from Last 3 Encounters:  08/06/24 174 lb 0.6 oz (78.9 kg)  05/28/24 173 lb (78.5 kg)  05/26/24 175 lb 6.4 oz (79.6 kg)       Assessment & Plan:   Problem List Items Addressed This Visit       Cardiovascular and Mediastinum   CAD in native artery - Primary   Essential hypertension     Respiratory   COPD with asthma (HCC)     Endocrine   Thyroid  disease     Nervous and Auditory   Essential tremor     Genitourinary   CKD (chronic kidney disease), stage IV (HCC)     Other   Anxiety and depression   B12 deficiency   Mild cognitive impairment   Vitamin D  deficiency   Hearing loss, bilateral cerumen impaction May be related to cerumen impaction. Right ear has more wax accumulation. - Performed ear irrigation during OV Procedure: Ear irrigation performed using warm water  by CMA, Amber. Some cerumen successfully cleared without complications, and the patient tolerated the procedure well. -Residual cerumen noted deep in the ear canal and unable to be cleared fully. Will refer to ENT for complete removal. - Consider referral for hearing testing if irrigation does not improve hearing.    I am having Emily EMERSON Mitchell maintain her aspirin  EC, acetaminophen , Calcium  Citrate-Vitamin D , Cholecalciferol, cyanocobalamin , NON FORMULARY, esomeprazole , primidone , albuterol , montelukast , ezetimibe , atorvastatin , levothyroxine , fluticasone -salmeterol, gabapentin , Fenofibric Acid , clonazePAM , and metoprolol  succinate.  No orders of the defined types were placed in this encounter.  "

## 2024-08-19 ENCOUNTER — Ambulatory Visit (INDEPENDENT_AMBULATORY_CARE_PROVIDER_SITE_OTHER): Admitting: Student

## 2024-08-19 ENCOUNTER — Encounter: Payer: Self-pay | Admitting: Student

## 2024-08-19 VITALS — BP 138/76 | HR 60 | Temp 98.2°F | Resp 16 | Ht 61.0 in | Wt 173.8 lb

## 2024-08-19 DIAGNOSIS — I10 Essential (primary) hypertension: Secondary | ICD-10-CM | POA: Diagnosis not present

## 2024-08-19 DIAGNOSIS — E079 Disorder of thyroid, unspecified: Secondary | ICD-10-CM | POA: Diagnosis not present

## 2024-08-19 DIAGNOSIS — F419 Anxiety disorder, unspecified: Secondary | ICD-10-CM

## 2024-08-19 DIAGNOSIS — G3184 Mild cognitive impairment, so stated: Secondary | ICD-10-CM | POA: Diagnosis not present

## 2024-08-19 DIAGNOSIS — I2510 Atherosclerotic heart disease of native coronary artery without angina pectoris: Secondary | ICD-10-CM | POA: Diagnosis not present

## 2024-08-19 DIAGNOSIS — N184 Chronic kidney disease, stage 4 (severe): Secondary | ICD-10-CM | POA: Diagnosis not present

## 2024-08-19 DIAGNOSIS — E559 Vitamin D deficiency, unspecified: Secondary | ICD-10-CM

## 2024-08-19 DIAGNOSIS — F32A Depression, unspecified: Secondary | ICD-10-CM | POA: Diagnosis not present

## 2024-08-19 DIAGNOSIS — G250 Essential tremor: Secondary | ICD-10-CM

## 2024-08-19 DIAGNOSIS — E538 Deficiency of other specified B group vitamins: Secondary | ICD-10-CM | POA: Diagnosis not present

## 2024-08-19 DIAGNOSIS — J4489 Other specified chronic obstructive pulmonary disease: Secondary | ICD-10-CM | POA: Diagnosis not present

## 2024-08-19 DIAGNOSIS — R7989 Other specified abnormal findings of blood chemistry: Secondary | ICD-10-CM

## 2024-08-19 MED ORDER — AMLODIPINE BESYLATE 2.5 MG PO TABS: 2.5000 mg | ORAL_TABLET | Freq: Every day | ORAL | 2 refills | Status: AC

## 2024-08-19 MED ORDER — CLONAZEPAM 1 MG PO TABS: 1.0000 mg | ORAL_TABLET | Freq: Two times a day (BID) | ORAL | 1 refills | Status: AC | PRN

## 2024-08-19 NOTE — Assessment & Plan Note (Signed)
 Follows with neurology.  Her daughter and son help regularly in the home.  Patient's daughter reports she and her brother switch off caring for patient and live in the home half of the time

## 2024-08-19 NOTE — Assessment & Plan Note (Addendum)
 Stable. Denies SI/HI. Taking clonazepam  at bedtime and sparingly during the day for panic/stress.  UDS and contract updated- 04/16/2024 Rx refilled

## 2024-08-19 NOTE — Assessment & Plan Note (Signed)
"  Stable   Asymptomatic   "

## 2024-08-19 NOTE — Assessment & Plan Note (Signed)
 Stable. No acute concerns.

## 2024-08-19 NOTE — Assessment & Plan Note (Addendum)
 Supplement and monitor, elevated last visit advised to cut down to twice per week. Recheck at FU.

## 2024-08-19 NOTE — Assessment & Plan Note (Addendum)
 Hydrate and monitor. Follows with [facility]

## 2024-08-19 NOTE — Assessment & Plan Note (Signed)
 Supplement and Monitor

## 2024-08-19 NOTE — Assessment & Plan Note (Signed)
 On levothyroxine .  Continue to monitor.

## 2024-08-19 NOTE — Assessment & Plan Note (Signed)
 Well controlled, no changes to meds. Encouraged heart healthy diet such as the DASH diet and exercise as tolerated.

## 2024-08-25 ENCOUNTER — Other Ambulatory Visit: Payer: Self-pay | Admitting: Family Medicine

## 2024-08-25 DIAGNOSIS — E785 Hyperlipidemia, unspecified: Secondary | ICD-10-CM

## 2024-08-26 ENCOUNTER — Other Ambulatory Visit: Payer: Self-pay | Admitting: Family Medicine

## 2024-10-09 ENCOUNTER — Ambulatory Visit: Admitting: Neurology

## 2024-10-17 ENCOUNTER — Ambulatory Visit: Admitting: Adult Health

## 2024-12-29 ENCOUNTER — Ambulatory Visit: Admitting: Family Medicine

## 2024-12-30 ENCOUNTER — Ambulatory Visit: Admitting: Student

## 2025-05-27 ENCOUNTER — Ambulatory Visit
